# Patient Record
Sex: Female | Born: 1940 | Race: White | Hispanic: No | Marital: Married | Smoking: Never smoker
Health system: Southern US, Community
[De-identification: ages and names within clinical notes are randomized; demographics above are authoritative.]

## PROBLEM LIST (undated history)

## (undated) DIAGNOSIS — H409 Unspecified glaucoma: Secondary | ICD-10-CM

## (undated) DIAGNOSIS — L570 Actinic keratosis: Secondary | ICD-10-CM

## (undated) DIAGNOSIS — I079 Rheumatic tricuspid valve disease, unspecified: Secondary | ICD-10-CM

## (undated) DIAGNOSIS — Z8489 Family history of other specified conditions: Secondary | ICD-10-CM

## (undated) DIAGNOSIS — I10 Essential (primary) hypertension: Secondary | ICD-10-CM

## (undated) DIAGNOSIS — M5430 Sciatica, unspecified side: Secondary | ICD-10-CM

## (undated) DIAGNOSIS — C4490 Unspecified malignant neoplasm of skin, unspecified: Secondary | ICD-10-CM

## (undated) DIAGNOSIS — J302 Other seasonal allergic rhinitis: Secondary | ICD-10-CM

## (undated) DIAGNOSIS — R011 Cardiac murmur, unspecified: Secondary | ICD-10-CM

## (undated) DIAGNOSIS — I4891 Unspecified atrial fibrillation: Secondary | ICD-10-CM

## (undated) DIAGNOSIS — K8020 Calculus of gallbladder without cholecystitis without obstruction: Secondary | ICD-10-CM

## (undated) DIAGNOSIS — R200 Anesthesia of skin: Secondary | ICD-10-CM

## (undated) DIAGNOSIS — K219 Gastro-esophageal reflux disease without esophagitis: Secondary | ICD-10-CM

## (undated) DIAGNOSIS — M1990 Unspecified osteoarthritis, unspecified site: Secondary | ICD-10-CM

## (undated) DIAGNOSIS — C4492 Squamous cell carcinoma of skin, unspecified: Secondary | ICD-10-CM

## (undated) DIAGNOSIS — R42 Dizziness and giddiness: Secondary | ICD-10-CM

## (undated) HISTORY — DX: Squamous cell carcinoma of skin, unspecified: C44.92

## (undated) HISTORY — DX: Actinic keratosis: L57.0

---

## 1969-07-13 HISTORY — PX: TONSILLECTOMY: SUR1361

## 1985-07-13 HISTORY — PX: ABDOMINAL HYSTERECTOMY: SHX81

## 2004-07-15 ENCOUNTER — Ambulatory Visit: Payer: Self-pay | Admitting: Internal Medicine

## 2006-04-15 ENCOUNTER — Ambulatory Visit: Payer: Self-pay | Admitting: Internal Medicine

## 2007-03-21 ENCOUNTER — Emergency Department: Payer: Self-pay | Admitting: Emergency Medicine

## 2007-03-21 ENCOUNTER — Other Ambulatory Visit: Payer: Self-pay

## 2007-04-18 ENCOUNTER — Ambulatory Visit: Payer: Self-pay | Admitting: Internal Medicine

## 2008-07-13 HISTORY — PX: CARDIAC CATHETERIZATION: SHX172

## 2008-08-20 ENCOUNTER — Ambulatory Visit: Payer: Self-pay | Admitting: Unknown Physician Specialty

## 2008-10-01 ENCOUNTER — Ambulatory Visit: Payer: Self-pay | Admitting: Internal Medicine

## 2009-05-14 ENCOUNTER — Inpatient Hospital Stay: Payer: Self-pay | Admitting: Internal Medicine

## 2009-11-19 ENCOUNTER — Ambulatory Visit: Payer: Self-pay | Admitting: Internal Medicine

## 2011-01-28 ENCOUNTER — Ambulatory Visit: Payer: Self-pay | Admitting: Ophthalmology

## 2011-03-31 ENCOUNTER — Ambulatory Visit: Payer: Self-pay | Admitting: Internal Medicine

## 2011-07-14 HISTORY — PX: MOHS SURGERY: SUR867

## 2012-04-20 ENCOUNTER — Ambulatory Visit: Payer: Self-pay | Admitting: Internal Medicine

## 2012-07-04 ENCOUNTER — Ambulatory Visit: Payer: Self-pay | Admitting: Orthopedic Surgery

## 2013-05-09 ENCOUNTER — Ambulatory Visit: Payer: Self-pay | Admitting: Family Medicine

## 2013-11-23 ENCOUNTER — Ambulatory Visit: Payer: Self-pay | Admitting: Unknown Physician Specialty

## 2013-11-27 LAB — PATHOLOGY REPORT

## 2014-05-02 ENCOUNTER — Ambulatory Visit: Payer: Self-pay | Admitting: Family Medicine

## 2014-06-16 ENCOUNTER — Ambulatory Visit: Payer: Self-pay | Admitting: Internal Medicine

## 2014-08-21 ENCOUNTER — Ambulatory Visit: Payer: Self-pay | Admitting: Family Medicine

## 2015-06-14 ENCOUNTER — Other Ambulatory Visit: Payer: Self-pay | Admitting: Family Medicine

## 2015-06-14 DIAGNOSIS — Z8719 Personal history of other diseases of the digestive system: Secondary | ICD-10-CM

## 2015-06-14 DIAGNOSIS — R1011 Right upper quadrant pain: Secondary | ICD-10-CM

## 2015-06-17 ENCOUNTER — Ambulatory Visit
Admission: RE | Admit: 2015-06-17 | Discharge: 2015-06-17 | Disposition: A | Discharge status: Home / Self Care | Payer: Medicare Other | Source: Ambulatory Visit | Attending: Family Medicine | Admitting: Family Medicine

## 2015-06-17 DIAGNOSIS — R1011 Right upper quadrant pain: Secondary | ICD-10-CM | POA: Diagnosis not present

## 2015-06-17 DIAGNOSIS — K8020 Calculus of gallbladder without cholecystitis without obstruction: Secondary | ICD-10-CM | POA: Insufficient documentation

## 2015-06-17 DIAGNOSIS — Z8719 Personal history of other diseases of the digestive system: Secondary | ICD-10-CM

## 2015-07-30 ENCOUNTER — Other Ambulatory Visit: Payer: Self-pay | Admitting: Family Medicine

## 2015-07-30 DIAGNOSIS — K8020 Calculus of gallbladder without cholecystitis without obstruction: Secondary | ICD-10-CM

## 2015-07-30 DIAGNOSIS — R1011 Right upper quadrant pain: Secondary | ICD-10-CM

## 2015-08-05 ENCOUNTER — Encounter
Admission: RE | Admit: 2015-08-05 | Discharge: 2015-08-05 | Disposition: A | Discharge status: Home / Self Care | Payer: Medicare Other | Source: Ambulatory Visit | Attending: Family Medicine | Admitting: Family Medicine

## 2015-08-05 DIAGNOSIS — K8020 Calculus of gallbladder without cholecystitis without obstruction: Secondary | ICD-10-CM | POA: Insufficient documentation

## 2015-08-05 DIAGNOSIS — R1011 Right upper quadrant pain: Secondary | ICD-10-CM | POA: Diagnosis not present

## 2015-08-05 HISTORY — DX: Calculus of gallbladder without cholecystitis without obstruction: K80.20

## 2015-08-05 MED ORDER — SINCALIDE 5 MCG IJ SOLR: 0.0200 ug/kg | Freq: Once | INTRAMUSCULAR | Status: DC

## 2015-08-05 MED ORDER — SINCALIDE 5 MCG IJ SOLR
0.0200 ug/kg | Freq: Once | INTRAMUSCULAR | Status: AC
  Administered 2015-08-05: 1.7 ug via INTRAVENOUS

## 2015-08-05 MED ORDER — TECHNETIUM TC 99M MEBROFENIN IV KIT
5.1500 | PACK | Freq: Once | INTRAVENOUS | Status: AC | PRN
  Administered 2015-08-05: 5.15 via INTRAVENOUS

## 2015-08-22 ENCOUNTER — Encounter: Payer: Self-pay | Admitting: *Deleted

## 2015-08-26 NOTE — Discharge Instructions (Signed)

## 2015-08-28 ENCOUNTER — Ambulatory Visit: Payer: Medicare Other | Admitting: Anesthesiology

## 2015-08-28 ENCOUNTER — Encounter
Admission: RE | Disposition: A | Discharge status: Home / Self Care | Payer: Self-pay | Source: Ambulatory Visit | Attending: Ophthalmology

## 2015-08-28 ENCOUNTER — Ambulatory Visit
Admission: RE | Admit: 2015-08-28 | Discharge: 2015-08-28 | Disposition: A | Discharge status: Home / Self Care | Payer: Medicare Other | Source: Ambulatory Visit | Attending: Ophthalmology | Admitting: Ophthalmology

## 2015-08-28 DIAGNOSIS — M810 Age-related osteoporosis without current pathological fracture: Secondary | ICD-10-CM | POA: Diagnosis not present

## 2015-08-28 DIAGNOSIS — Z9889 Other specified postprocedural states: Secondary | ICD-10-CM | POA: Diagnosis not present

## 2015-08-28 DIAGNOSIS — Z85828 Personal history of other malignant neoplasm of skin: Secondary | ICD-10-CM | POA: Diagnosis not present

## 2015-08-28 DIAGNOSIS — I4891 Unspecified atrial fibrillation: Secondary | ICD-10-CM | POA: Diagnosis not present

## 2015-08-28 DIAGNOSIS — H269 Unspecified cataract: Secondary | ICD-10-CM | POA: Diagnosis present

## 2015-08-28 DIAGNOSIS — Z9849 Cataract extraction status, unspecified eye: Secondary | ICD-10-CM | POA: Insufficient documentation

## 2015-08-28 DIAGNOSIS — Z79899 Other long term (current) drug therapy: Secondary | ICD-10-CM | POA: Insufficient documentation

## 2015-08-28 DIAGNOSIS — Z888 Allergy status to other drugs, medicaments and biological substances status: Secondary | ICD-10-CM | POA: Diagnosis not present

## 2015-08-28 DIAGNOSIS — K219 Gastro-esophageal reflux disease without esophagitis: Secondary | ICD-10-CM | POA: Insufficient documentation

## 2015-08-28 DIAGNOSIS — I10 Essential (primary) hypertension: Secondary | ICD-10-CM | POA: Insufficient documentation

## 2015-08-28 DIAGNOSIS — H2511 Age-related nuclear cataract, right eye: Secondary | ICD-10-CM | POA: Diagnosis not present

## 2015-08-28 DIAGNOSIS — Z90710 Acquired absence of both cervix and uterus: Secondary | ICD-10-CM | POA: Diagnosis not present

## 2015-08-28 DIAGNOSIS — M1991 Primary osteoarthritis, unspecified site: Secondary | ICD-10-CM | POA: Diagnosis not present

## 2015-08-28 HISTORY — DX: Sciatica, unspecified side: M54.30

## 2015-08-28 HISTORY — DX: Gastro-esophageal reflux disease without esophagitis: K21.9

## 2015-08-28 HISTORY — DX: Unspecified malignant neoplasm of skin, unspecified: C44.90

## 2015-08-28 HISTORY — DX: Essential (primary) hypertension: I10

## 2015-08-28 HISTORY — DX: Unspecified osteoarthritis, unspecified site: M19.90

## 2015-08-28 HISTORY — DX: Dizziness and giddiness: R42

## 2015-08-28 HISTORY — DX: Unspecified glaucoma: H40.9

## 2015-08-28 HISTORY — DX: Unspecified atrial fibrillation: I48.91

## 2015-08-28 HISTORY — DX: Anesthesia of skin: R20.0

## 2015-08-28 HISTORY — DX: Other seasonal allergic rhinitis: J30.2

## 2015-08-28 HISTORY — DX: Family history of other specified conditions: Z84.89

## 2015-08-28 HISTORY — PX: CATARACT EXTRACTION W/PHACO: SHX586

## 2015-08-28 HISTORY — DX: Rheumatic tricuspid valve disease, unspecified: I07.9

## 2015-08-28 HISTORY — DX: Cardiac murmur, unspecified: R01.1

## 2015-08-28 SURGERY — PHACOEMULSIFICATION, CATARACT, WITH IOL INSERTION
Anesthesia: Monitor Anesthesia Care | Laterality: Right | Wound class: Clean

## 2015-08-28 MED ORDER — NA HYALUR & NA CHOND-NA HYALUR 0.4-0.35 ML IO KIT
PACK | INTRAOCULAR | Status: DC | PRN
  Administered 2015-08-28: 1 mL via INTRAOCULAR

## 2015-08-28 MED ORDER — MIDAZOLAM HCL 2 MG/2ML IJ SOLN
INTRAMUSCULAR | Status: DC | PRN
  Administered 2015-08-28: 2 mg via INTRAVENOUS

## 2015-08-28 MED ORDER — TETRACAINE HCL 0.5 % OP SOLN
1.0000 [drp] | OPHTHALMIC | Status: DC | PRN
  Administered 2015-08-28: 1 [drp] via OPHTHALMIC

## 2015-08-28 MED ORDER — ARMC OPHTHALMIC DILATING GEL
1.0000 "application " | OPHTHALMIC | Status: DC | PRN
  Administered 2015-08-28 (×2): 1 via OPHTHALMIC

## 2015-08-28 MED ORDER — POVIDONE-IODINE 5 % OP SOLN
1.0000 "application " | OPHTHALMIC | Status: DC | PRN
  Administered 2015-08-28: 1 via OPHTHALMIC

## 2015-08-28 MED ORDER — CEFUROXIME OPHTHALMIC INJECTION 1 MG/0.1 ML
INJECTION | OPHTHALMIC | Status: DC | PRN
  Administered 2015-08-28: 0.1 mL via OPHTHALMIC

## 2015-08-28 MED ORDER — EPINEPHRINE HCL 1 MG/ML IJ SOLN
INTRAOCULAR | Status: DC | PRN
  Administered 2015-08-28: 61 mL via OPHTHALMIC

## 2015-08-28 MED ORDER — TIMOLOL MALEATE 0.5 % OP SOLN
OPHTHALMIC | Status: DC | PRN
  Administered 2015-08-28: 1 [drp] via OPHTHALMIC

## 2015-08-28 MED ORDER — FENTANYL CITRATE (PF) 100 MCG/2ML IJ SOLN
INTRAMUSCULAR | Status: DC | PRN
  Administered 2015-08-28: 100 ug via INTRAVENOUS

## 2015-08-28 SURGICAL SUPPLY — 28 items
CANNULA ANT/CHMB 27GA (MISCELLANEOUS) ×2 IMPLANT
CARTRIDGE ABBOTT (MISCELLANEOUS) ×2 IMPLANT
GLOVE SURG LX 7.5 STRW (GLOVE) ×1
GLOVE SURG LX STRL 7.5 STRW (GLOVE) ×1 IMPLANT
GLOVE SURG TRIUMPH 8.0 PF LTX (GLOVE) ×2 IMPLANT
GOWN STRL REUS W/ TWL LRG LVL3 (GOWN DISPOSABLE) ×2 IMPLANT
GOWN STRL REUS W/TWL LRG LVL3 (GOWN DISPOSABLE) ×2
LENS IOL ACRSF IQ TRC 5 21.5 (Intraocular Lens) ×1 IMPLANT
LENS IOL ACRYSOF IQ TORIC 21.5 (Intraocular Lens) ×1 IMPLANT
LENS IOL IQ TORIC 5 21.5 (Intraocular Lens) ×1 IMPLANT
MARKER SKIN DUAL TIP RULER LAB (MISCELLANEOUS) ×2 IMPLANT
NDL RETROBULBAR .5 NSTRL (NEEDLE) IMPLANT
NEEDLE FILTER BLUNT 18X 1/2SAF (NEEDLE) ×1
NEEDLE FILTER BLUNT 18X1 1/2 (NEEDLE) ×1 IMPLANT
PACK CATARACT BRASINGTON (MISCELLANEOUS) ×2 IMPLANT
PACK EYE AFTER SURG (MISCELLANEOUS) ×2 IMPLANT
PACK OPTHALMIC (MISCELLANEOUS) ×2 IMPLANT
RING MALYGIN 7.0 (MISCELLANEOUS) IMPLANT
SUT ETHILON 10-0 CS-B-6CS-B-6 (SUTURE)
SUT VICRYL  9 0 (SUTURE)
SUT VICRYL 9 0 (SUTURE) IMPLANT
SUTURE EHLN 10-0 CS-B-6CS-B-6 (SUTURE) IMPLANT
SYR 3ML LL SCALE MARK (SYRINGE) ×2 IMPLANT
SYR 5ML LL (SYRINGE) IMPLANT
SYR TB 1ML LUER SLIP (SYRINGE) ×2 IMPLANT
WATER STERILE IRR 250ML POUR (IV SOLUTION) ×2 IMPLANT
WATER STERILE IRR 500ML POUR (IV SOLUTION) IMPLANT
WIPE NON LINTING 3.25X3.25 (MISCELLANEOUS) ×2 IMPLANT

## 2015-08-28 NOTE — H&P (Signed)
  The History and Physical notes are on paper, have been signed, and are to be scanned. The patient remains stable and unchanged from the H&P.   Previous H&P reviewed, patient examined, and there are no changes.  Joyce Evans 08/28/2015 9:20 AM

## 2015-08-28 NOTE — Anesthesia Preprocedure Evaluation (Signed)
Anesthesia Evaluation  Patient identified by MRN, date of birth, ID band  Reviewed: Allergy & Precautions, H&P , NPO status , Patient's Chart, lab work & pertinent test results  Airway Mallampati: II  TM Distance: >3 FB Neck ROM: full    Dental no notable dental hx.    Pulmonary    Pulmonary exam normal       Cardiovascular hypertension, + dysrhythmias Atrial Fibrillation Rhythm:regular Rate:Normal     Neuro/Psych    GI/Hepatic GERD-  ,  Endo/Other    Renal/GU      Musculoskeletal   Abdominal   Peds  Hematology   Anesthesia Other Findings   Reproductive/Obstetrics                             Anesthesia Physical Anesthesia Plan  ASA: II  Anesthesia Plan: MAC   Post-op Pain Management:    Induction:   Airway Management Planned:   Additional Equipment:   Intra-op Plan:   Post-operative Plan:   Informed Consent: I have reviewed the patients History and Physical, chart, labs and discussed the procedure including the risks, benefits and alternatives for the proposed anesthesia with the patient or authorized representative who has indicated his/her understanding and acceptance.     Plan Discussed with: CRNA  Anesthesia Plan Comments:         Anesthesia Quick Evaluation  

## 2015-08-28 NOTE — Anesthesia Postprocedure Evaluation (Signed)
Anesthesia Post Note  Patient: Joyce Evans  Procedure(s) Performed: Procedure(s) (LRB): CATARACT EXTRACTION PHACO AND INTRAOCULAR LENS PLACEMENT (IOC) (Right)  Patient location during evaluation: PACU Anesthesia Type: MAC Level of consciousness: awake and alert and oriented Pain management: satisfactory to patient Vital Signs Assessment: post-procedure vital signs reviewed and stable Respiratory status: spontaneous breathing, nonlabored ventilation and respiratory function stable Cardiovascular status: blood pressure returned to baseline and stable Postop Assessment: Adequate PO intake and No signs of nausea or vomiting Anesthetic complications: no    Raliegh Ip

## 2015-08-28 NOTE — Anesthesia Procedure Notes (Signed)
Procedure Name: MAC Performed by: Tomislav Micale Pre-anesthesia Checklist: Patient identified, Emergency Drugs available, Suction available, Timeout performed and Patient being monitored Patient Re-evaluated:Patient Re-evaluated prior to inductionOxygen Delivery Method: Nasal cannula Placement Confirmation: positive ETCO2       

## 2015-08-28 NOTE — Transfer of Care (Signed)
Immediate Anesthesia Transfer of Care Note  Patient: Joyce Evans  Procedure(s) Performed: Procedure(s) with comments: CATARACT EXTRACTION PHACO AND INTRAOCULAR LENS PLACEMENT (IOC) (Right) - TORIC  Patient Location: PACU  Anesthesia Type: MAC  Level of Consciousness: awake, alert  and patient cooperative  Airway and Oxygen Therapy: Patient Spontanous Breathing and Patient connected to supplemental oxygen  Post-op Assessment: Post-op Vital signs reviewed, Patient's Cardiovascular Status Stable, Respiratory Function Stable, Patent Airway and No signs of Nausea or vomiting  Post-op Vital Signs: Reviewed and stable  Complications: No apparent anesthesia complications

## 2015-08-28 NOTE — Op Note (Signed)
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   PROCEDURE:  Phacoemulsification with Toric posterior chamber intraocular lens placement of the right eye.   LENS:   Implant Name Type Inv. Item Serial No. Manufacturer Lot No. LRB No. Used  lens 21.5     [id number] ALCON   Right 1     [id number] 21.5 D Toric intraocular lens with 3.0 diopters of cylindrical power with axis orientation at 92 degrees.   ULTRASOUND TIME: 16.5 % of 1 minutes, 0 seconds.  CDE 10.0   SURGEON:  Wyonia Hough, MD   ANESTHESIA:  Topical with tetracaine drops and 2% Xylocaine jelly.   COMPLICATIONS:  None.   DESCRIPTION OF PROCEDURE:  The patient was identified in the holding room and transported to the operating suite and placed in the supine position under the operating microscope.  The right eye was identified as the operative eye, and it was prepped and draped in the usual sterile ophthalmic fashion.    A clear-corneal paracentesis incision was made at the 12:00 position.  The anterior chamber was filled with Viscoat.  A 2.4 millimeter near clear corneal incision was then made at the 9:00 position.  A cystotome and capsulorrhexis forceps were then used to make a curvilinear capsulorrhexis.  Hydrodissection and hydrodelineation were then performed using balanced salt solution.   Phacoemulsification was then used in stop and chop fashion to remove the lens, nucleus and epinucleus.  The remaining cortex was aspirated using the irrigation and aspiration handpiece.  Provisc viscoelastic was then placed into the capsular bag to distend it for lens placement.  The Verion digital marker was used to align the implant at the intended axis.   A Toric lens was then injected into the capsular bag.  It was rotated clockwise until the axis marks on the lens were approximately 15 degrees  in the counterclockwise direction to the intended alignment.  The viscoelastic was aspirated from the eye using the irrigation aspiration handpiece.  Then, a Koch spatula through the sideport incision was used to rotate the lens in a clockwise direction until the axis markings of the intraocular lens were lined up with the Verion alignment.  Balanced salt solution was then used to hydrate the wounds. Cefuroxime 0.1 ml of a 10mg /ml solution was injected into the anterior chamber for a dose of 1 mg of intracameral antibiotic at the completion of the case.    The eye was noted to have a physiologic pressure and there was no wound leak noted.   Timolol and Brimonidine drops were applied to the eye.  The patient was taken to the recovery room in stable condition having had no complications of anesthesia or surgery.  Joyce Evans 08/28/2015, 10:11 AM

## 2015-08-29 ENCOUNTER — Encounter: Payer: Self-pay | Admitting: Ophthalmology

## 2015-10-16 ENCOUNTER — Ambulatory Visit: Payer: Medicare Other | Attending: Specialist

## 2015-10-16 DIAGNOSIS — G4761 Periodic limb movement disorder: Secondary | ICD-10-CM | POA: Diagnosis not present

## 2015-10-16 DIAGNOSIS — Z6833 Body mass index (BMI) 33.0-33.9, adult: Secondary | ICD-10-CM | POA: Insufficient documentation

## 2016-04-20 DIAGNOSIS — C439 Malignant melanoma of skin, unspecified: Secondary | ICD-10-CM

## 2016-04-20 HISTORY — DX: Malignant melanoma of skin, unspecified: C43.9

## 2016-05-19 HISTORY — PX: MELANOMA EXCISION: SHX5266

## 2016-11-03 ENCOUNTER — Other Ambulatory Visit: Payer: Self-pay | Admitting: Family Medicine

## 2016-11-03 DIAGNOSIS — Z1231 Encounter for screening mammogram for malignant neoplasm of breast: Secondary | ICD-10-CM

## 2016-11-18 ENCOUNTER — Ambulatory Visit
Admission: RE | Admit: 2016-11-18 | Discharge: 2016-11-18 | Disposition: A | Discharge status: Home / Self Care | Payer: Medicare Other | Source: Ambulatory Visit | Attending: Family Medicine | Admitting: Family Medicine

## 2016-11-18 DIAGNOSIS — Z1231 Encounter for screening mammogram for malignant neoplasm of breast: Secondary | ICD-10-CM | POA: Diagnosis not present

## 2017-11-24 ENCOUNTER — Other Ambulatory Visit: Payer: Self-pay | Admitting: Family Medicine

## 2017-11-24 DIAGNOSIS — Z1231 Encounter for screening mammogram for malignant neoplasm of breast: Secondary | ICD-10-CM

## 2017-12-30 ENCOUNTER — Ambulatory Visit
Admission: RE | Admit: 2017-12-30 | Discharge: 2017-12-30 | Disposition: A | Discharge status: Home / Self Care | Payer: Medicare Other | Source: Ambulatory Visit | Attending: Family Medicine | Admitting: Family Medicine

## 2017-12-30 DIAGNOSIS — Z1231 Encounter for screening mammogram for malignant neoplasm of breast: Secondary | ICD-10-CM | POA: Insufficient documentation

## 2018-03-08 ENCOUNTER — Encounter: Payer: Self-pay | Admitting: *Deleted

## 2018-03-08 ENCOUNTER — Encounter: Payer: Medicare Other | Attending: Internal Medicine | Admitting: *Deleted

## 2018-03-08 VITALS — Ht 63.2 in | Wt 179.5 lb

## 2018-03-08 DIAGNOSIS — Z952 Presence of prosthetic heart valve: Secondary | ICD-10-CM | POA: Diagnosis not present

## 2018-03-08 DIAGNOSIS — Z954 Presence of other heart-valve replacement: Secondary | ICD-10-CM

## 2018-03-08 NOTE — Patient Instructions (Signed)
Patient Instructions  Patient Details  Name: Joyce Evans MRN: [id number] Date of Birth: June 07, 1941 Referring Provider:  Joyice Faster, MD  Below are your personal goals for exercise, nutrition, and risk factors. Our goal is to help you stay on track towards obtaining and maintaining these goals. We will be discussing your progress on these goals with you throughout the program.  Initial Exercise Prescription: Initial Exercise Prescription - 03/08/18 1400      Date of Initial Exercise RX and Referring Provider   Date  03/08/18    Referring Provider  Ward, Jeani Hawking MD      Treadmill   MPH  2    Grade  0    Minutes  15    METs  2.53      NuStep   Level  2    SPM  80    Minutes  15    METs  2.5      Recumbant Elliptical   Level  1    RPM  50    Minutes  15    METs  2.5      Prescription Details   Frequency (times per week)  3    Duration  Progress to 45 minutes of aerobic exercise without signs/symptoms of physical distress      Intensity   THRR 40-80% of Max Heartrate  96-129    Ratings of Perceived Exertion  11-13    Perceived Dyspnea  0-4      Progression   Progression  Continue to progress workloads to maintain intensity without signs/symptoms of physical distress.      Resistance Training   Training Prescription  Yes    Weight  3 lbs    Reps  10-15       Exercise Goals: Frequency: Be able to perform aerobic exercise two to three times per week in program working toward 2-5 days per week of home exercise.  Intensity: Work with a perceived exertion of 11 (fairly light) - 15 (hard) while following your exercise prescription.  We will make changes to your prescription with you as you progress through the program.   Duration: Be able to do 30 to 45 minutes of continuous aerobic exercise in addition to a 5 minute warm-up and a 5 minute cool-down routine.   Nutrition Goals: Your personal nutrition goals will be established when you do your nutrition analysis with  the dietician.  The following are general nutrition guidelines to follow: Cholesterol < 200mg /day Sodium < 1500mg /day Fiber: Women over 50 yrs - 21 grams per day  Personal Goals: Personal Goals and Risk Factors at Admission - 03/08/18 1515      Core Components/Risk Factors/Patient Goals on Admission    Weight Management  Yes;Weight Loss    Admit Weight  179 lb (81.2 kg)    Goal Weight: Short Term  177 lb (80.3 kg)    Goal Weight: Long Term  160 lb (72.6 kg)    Expected Outcomes  Short Term: Continue to assess and modify interventions until short term weight is achieved;Long Term: Adherence to nutrition and physical activity/exercise program aimed toward attainment of established weight goal;Weight Loss: Understanding of general recommendations for a balanced deficit meal plan, which promotes 1-2 lb weight loss per week and includes a negative energy balance of [phone] kcal/d    Hypertension  Yes    Intervention  Provide education on lifestyle modifcations including regular physical activity/exercise, weight management, moderate sodium restriction and increased consumption of fresh fruit,  vegetables, and low fat dairy, alcohol moderation, and smoking cessation.;Monitor prescription use compliance.    Expected Outcomes  Short Term: Continued assessment and intervention until BP is < 140/10mm HG in hypertensive participants. < 130/16mm HG in hypertensive participants with diabetes, heart failure or chronic kidney disease.;Long Term: Maintenance of blood pressure at goal levels.       Tobacco Use Initial Evaluation: Social History   Tobacco Use  Smoking Status Never Smoker  Smokeless Tobacco Never Used    Exercise Goals and Review: Exercise Goals    Row Name 03/08/18 1457             Exercise Goals   Increase Physical Activity  Yes       Intervention  Provide advice, education, support and counseling about physical activity/exercise needs.;Develop an individualized exercise  prescription for aerobic and resistive training based on initial evaluation findings, risk stratification, comorbidities and participant's personal goals.       Expected Outcomes  Short Term: Attend rehab on a regular basis to increase amount of physical activity.;Long Term: Add in home exercise to make exercise part of routine and to increase amount of physical activity.;Long Term: Exercising regularly at least 3-5 days a week.       Increase Strength and Stamina  Yes       Intervention  Provide advice, education, support and counseling about physical activity/exercise needs.;Develop an individualized exercise prescription for aerobic and resistive training based on initial evaluation findings, risk stratification, comorbidities and participant's personal goals.       Expected Outcomes  Short Term: Increase workloads from initial exercise prescription for resistance, speed, and METs.;Short Term: Perform resistance training exercises routinely during rehab and add in resistance training at home;Long Term: Improve cardiorespiratory fitness, muscular endurance and strength as measured by increased METs and functional capacity (6MWT)       Able to understand and use rate of perceived exertion (RPE) scale  Yes       Intervention  Provide education and explanation on how to use RPE scale       Expected Outcomes  Short Term: Able to use RPE daily in rehab to express subjective intensity level;Long Term:  Able to use RPE to guide intensity level when exercising independently       Able to understand and use Dyspnea scale  Yes       Intervention  Provide education and explanation on how to use Dyspnea scale       Expected Outcomes  Short Term: Able to use Dyspnea scale daily in rehab to express subjective sense of shortness of breath during exertion;Long Term: Able to use Dyspnea scale to guide intensity level when exercising independently       Knowledge and understanding of Target Heart Rate Range (THRR)  Yes        Intervention  Provide education and explanation of THRR including how the numbers were predicted and where they are located for reference       Expected Outcomes  Short Term: Able to use daily as guideline for intensity in rehab;Short Term: Able to state/look up THRR;Long Term: Able to use THRR to govern intensity when exercising independently       Able to check pulse independently  Yes       Intervention  Provide education and demonstration on how to check pulse in carotid and radial arteries.;Review the importance of being able to check your own pulse for safety during independent exercise  Expected Outcomes  Short Term: Able to explain why pulse checking is important during independent exercise;Long Term: Able to check pulse independently and accurately       Understanding of Exercise Prescription  Yes       Intervention  Provide education, explanation, and written materials on patient's individual exercise prescription       Expected Outcomes  Short Term: Able to explain program exercise prescription;Long Term: Able to explain home exercise prescription to exercise independently          Copy of goals given to participant.

## 2018-03-08 NOTE — Progress Notes (Signed)
Daily Session Note  Patient Details  Name: Joyce Evans MRN: [id number] Date of Birth: 1941-03-29 Referring Provider:     Cardiac Rehab from 03/08/2018 in [facility]  Referring Provider  Ward, Jeani Hawking MD      Encounter Date: 03/08/2018  Check In: Session Check In - 03/08/18 1506      Check-In   Supervising physician immediately available to respond to emergencies  See telemetry face sheet for immediately available ER MD    Location  [facility]    Staff Present  Heath Lark, RN, BSN, CCRP;Jessica [place], MA, RCEP, CCRP, Exercise Physiologist    Warm-up and Cool-down  --   medical review completed today   VAD Patient?  No    PAD/SET Patient?  No      Pain Assessment   Currently in Pain?  No/denies        Exercise Prescription Changes - 03/08/18 1400      Response to Exercise   Blood Pressure (Admit)  128/74    Blood Pressure (Exercise)  154/84    Blood Pressure (Exit)  146/64    Heart Rate (Admit)  64 bpm    Heart Rate (Exercise)  112 bpm    Heart Rate (Exit)  81 bpm    Oxygen Saturation (Admit)  99 %    Oxygen Saturation (Exercise)  94 %    Rating of Perceived Exertion (Exercise)  13    Perceived Dyspnea (Exercise)  1    Symptoms  SOB, low back twinge    Comments  walk test results       Social History   Tobacco Use  Smoking Status Never Smoker    Goals Met:  Exercise tolerated well Personal goals reviewed No report of cardiac concerns or symptoms  Goals Unmet:  Not Applicable  Comments: Medical review completed today   Dr. Emily Filbert is Medical Director for [place] and [name].

## 2018-03-08 NOTE — Progress Notes (Signed)
Cardiac Individual Treatment Plan  Patient Details  Name: Joyce Evans MRN: [id number] Date of Birth: 18-Jul-1940 Referring Provider:     Cardiac Rehab from 03/08/2018 in [facility] Cardiac and Pulmonary Rehab  Referring Provider  Ward, Jeani Hawking MD      Initial Encounter Date:    Cardiac Rehab from 03/08/2018 in [facility] Cardiac and Pulmonary Rehab  Date  03/08/18      Visit Diagnosis: S/P tricuspid valve replacement  Patient's Home Medications on Admission:  Current Outpatient Medications:  .  acetaminophen (TYLENOL) 500 MG tablet, Take 500 mg by mouth every 6 (six) hours as needed., Disp: , Rfl:  .  apixaban (ELIQUIS) 5 MG TABS tablet, TAKE 1 TABLET BY MOUTH TWO  TIMES DAILY, Disp: , Rfl:  .  brimonidine (ALPHAGAN P) 0.1 % SOLN, Place 1 drop into the right eye 2 (two) times daily., Disp: , Rfl:  .  Brinzolamide-Brimonidine (SIMBRINZA) 1-0.2 % SUSP, Place 1 drop into the left eye 2 (two) times daily., Disp: , Rfl:  .  Cholecalciferol (VITAMIN D3) 3000 units TABS, Take by mouth 2 (two) times daily., Disp: , Rfl:  .  furosemide (LASIX) 20 MG tablet, Take by mouth., Disp: , Rfl:  .  multivitamin-iron-minerals-folic acid (CENTRUM) chewable tablet, Chew 1 tablet by mouth daily., Disp: , Rfl:  .  omeprazole (PRILOSEC) 20 MG capsule, Take 20 mg by mouth 2 (two) times daily. Reported on 08/28/2015, Disp: , Rfl:  .  potassium chloride (K-DUR) 10 MEQ tablet, Take by mouth., Disp: , Rfl:  .  Probiotic Product (PROBIOTIC DAILY PO), Take by mouth daily., Disp: , Rfl:  .  timolol (BETIMOL) 0.5 % ophthalmic solution, Place 1 drop into both eyes daily., Disp: , Rfl:  .  ALPRAZolam (XANAX) 0.25 MG tablet, Take 0.125 mg by mouth at bedtime., Disp: , Rfl:  .  Calcium-Phosphorus-Vitamin D (CITRACAL +D3 PO), Take by mouth daily., Disp: , Rfl:  .  famotidine-calcium carbonate-magnesium hydroxide (PEPCID COMPLETE) 10-800-165 MG chewable tablet, Chew 1 tablet by mouth daily as needed., Disp: , Rfl:   Past Medical  History: Past Medical History:  Diagnosis Date  . Arthritis    osteo - shoulders, fingers  . Atrial fibrillation ([place])   . Dizziness    thinks because of diuretic  . Family history of adverse reaction to anesthesia    daughter -PONV  . Gallstones 2016, 2017  . GERD (gastroesophageal reflux disease)   . Glaucoma   . Heart murmur    history of  . Hypertension   . Numbness in left leg    s/p hematoma  . Sciatica    right  . Seasonal allergies   . Skin cancer   . Tricuspid valve disorder    leak    Tobacco Use: Social History   Tobacco Use  Smoking Status Never Smoker  Smokeless Tobacco Never Used    Labs: Recent Review Flowsheet Data    There is no flowsheet data to display.       Exercise Target Goals: Exercise Program Goal: Individual exercise prescription set using results from initial 6 min walk test and THRR while considering  patient's activity barriers and safety.   Exercise Prescription Goal: Initial exercise prescription builds to 30-45 minutes a day of aerobic activity, 2-3 days per week.  Home exercise guidelines will be given to patient during program as part of exercise prescription that the participant will acknowledge.  Activity Barriers & Risk Stratification: Activity Barriers & Cardiac Risk Stratification - 03/08/18  1452      Activity Barriers & Cardiac Risk Stratification   Activity Barriers  Deconditioning;Muscular Weakness;Shortness of Breath;History of Falls;Balance Concerns;Joint Problems   R arm sore since surgery, L shoulder very little cartilidge, limited OM   Cardiac Risk Stratification  Moderate       6 Minute Walk: 6 Minute Walk    Row Name 03/08/18 1451         6 Minute Walk   Phase  Initial     Distance  1265 feet     Walk Time  6 minutes     # of Rest Breaks  0     MPH  2.4     METS  2.54     RPE  13     Perceived Dyspnea   1     VO2 Peak  8.89     Symptoms  Yes (comment)     Comments  SOB, twinge in low back      Resting HR  64 bpm     Resting BP  128/74     Resting Oxygen Saturation   99 %     Exercise Oxygen Saturation  during 6 min walk  94 %     Max Ex. HR  112 bpm     Max Ex. BP  154/84     2 Minute Post BP  146/64        Oxygen Initial Assessment:   Oxygen Re-Evaluation:   Oxygen Discharge (Final Oxygen Re-Evaluation):   Initial Exercise Prescription: Initial Exercise Prescription - 03/08/18 1400      Date of Initial Exercise RX and Referring Provider   Date  03/08/18    Referring Provider  Ward, Jeani Hawking MD      Treadmill   MPH  2    Grade  0    Minutes  15    METs  2.53      NuStep   Level  2    SPM  80    Minutes  15    METs  2.5      Recumbant Elliptical   Level  1    RPM  50    Minutes  15    METs  2.5      Prescription Details   Frequency (times per week)  3    Duration  Progress to 45 minutes of aerobic exercise without signs/symptoms of physical distress      Intensity   THRR 40-80% of Max Heartrate  96-129    Ratings of Perceived Exertion  11-13    Perceived Dyspnea  0-4      Progression   Progression  Continue to progress workloads to maintain intensity without signs/symptoms of physical distress.      Resistance Training   Training Prescription  Yes    Weight  3 lbs    Reps  10-15       Perform Capillary Blood Glucose checks as needed.  Exercise Prescription Changes: Exercise Prescription Changes    Row Name 03/08/18 1400             Response to Exercise   Blood Pressure (Admit)  128/74       Blood Pressure (Exercise)  154/84       Blood Pressure (Exit)  146/64       Heart Rate (Admit)  64 bpm       Heart Rate (Exercise)  112 bpm       Heart Rate (Exit)  81 bpm       Oxygen Saturation (Admit)  99 %       Oxygen Saturation (Exercise)  94 %       Rating of Perceived Exertion (Exercise)  13       Perceived Dyspnea (Exercise)  1       Symptoms  SOB, low back twinge       Comments  walk test results          Exercise  Comments:   Exercise Goals and Review: Exercise Goals    Row Name 03/08/18 1457             Exercise Goals   Increase Physical Activity  Yes       Intervention  Provide advice, education, support and counseling about physical activity/exercise needs.;Develop an individualized exercise prescription for aerobic and resistive training based on initial evaluation findings, risk stratification, comorbidities and participant's personal goals.       Expected Outcomes  Short Term: Attend rehab on a regular basis to increase amount of physical activity.;Long Term: Add in home exercise to make exercise part of routine and to increase amount of physical activity.;Long Term: Exercising regularly at least 3-5 days a week.       Increase Strength and Stamina  Yes       Intervention  Provide advice, education, support and counseling about physical activity/exercise needs.;Develop an individualized exercise prescription for aerobic and resistive training based on initial evaluation findings, risk stratification, comorbidities and participant's personal goals.       Expected Outcomes  Short Term: Increase workloads from initial exercise prescription for resistance, speed, and METs.;Short Term: Perform resistance training exercises routinely during rehab and add in resistance training at home;Long Term: Improve cardiorespiratory fitness, muscular endurance and strength as measured by increased METs and functional capacity (6MWT)       Able to understand and use rate of perceived exertion (RPE) scale  Yes       Intervention  Provide education and explanation on how to use RPE scale       Expected Outcomes  Short Term: Able to use RPE daily in rehab to express subjective intensity level;Long Term:  Able to use RPE to guide intensity level when exercising independently       Able to understand and use Dyspnea scale  Yes       Intervention  Provide education and explanation on how to use Dyspnea scale        Expected Outcomes  Short Term: Able to use Dyspnea scale daily in rehab to express subjective sense of shortness of breath during exertion;Long Term: Able to use Dyspnea scale to guide intensity level when exercising independently       Knowledge and understanding of Target Heart Rate Range (THRR)  Yes       Intervention  Provide education and explanation of THRR including how the numbers were predicted and where they are located for reference       Expected Outcomes  Short Term: Able to use daily as guideline for intensity in rehab;Short Term: Able to state/look up THRR;Long Term: Able to use THRR to govern intensity when exercising independently       Able to check pulse independently  Yes       Intervention  Provide education and demonstration on how to check pulse in carotid and radial arteries.;Review the importance of being able to check your own pulse for safety during independent exercise  Expected Outcomes  Short Term: Able to explain why pulse checking is important during independent exercise;Long Term: Able to check pulse independently and accurately       Understanding of Exercise Prescription  Yes       Intervention  Provide education, explanation, and written materials on patient's individual exercise prescription       Expected Outcomes  Short Term: Able to explain program exercise prescription;Long Term: Able to explain home exercise prescription to exercise independently          Exercise Goals Re-Evaluation :   Discharge Exercise Prescription (Final Exercise Prescription Changes): Exercise Prescription Changes - 03/08/18 1400      Response to Exercise   Blood Pressure (Admit)  128/74    Blood Pressure (Exercise)  154/84    Blood Pressure (Exit)  146/64    Heart Rate (Admit)  64 bpm    Heart Rate (Exercise)  112 bpm    Heart Rate (Exit)  81 bpm    Oxygen Saturation (Admit)  99 %    Oxygen Saturation (Exercise)  94 %    Rating of Perceived Exertion (Exercise)  13     Perceived Dyspnea (Exercise)  1    Symptoms  SOB, low back twinge    Comments  walk test results       Nutrition:  Target Goals: Understanding of nutrition guidelines, daily intake of sodium <1546m, cholesterol <2065m calories 30% from fat and 7% or less from saturated fats, daily to have 5 or more servings of fruits and vegetables.  Biometrics: Pre Biometrics - 03/08/18 1458      Pre Biometrics   Height  5' 3.2" (1.605 m)    Weight  179 lb 8 oz (81.4 kg)    Waist Circumference  34 inches    Hip Circumference  43 inches    Waist to Hip Ratio  0.79 %    BMI (Calculated)  31.61    Single Leg Stand  1.81 seconds        Nutrition Therapy Plan and Nutrition Goals: Nutrition Therapy & Goals - 03/08/18 1514      Intervention Plan   Intervention  Prescribe, educate and counsel regarding individualized specific dietary modifications aiming towards targeted core components such as weight, hypertension, lipid management, diabetes, heart failure and other comorbidities.    Expected Outcomes  Short Term Goal: Understand basic principles of dietary content, such as calories, fat, sodium, cholesterol and nutrients.;Short Term Goal: A plan has been developed with personal nutrition goals set during dietitian appointment.;Long Term Goal: Adherence to prescribed nutrition plan.       Nutrition Assessments: Nutrition Assessments - 03/08/18 1428      MEDFICTS Scores   Pre Score  40       Nutrition Goals Re-Evaluation:   Nutrition Goals Discharge (Final Nutrition Goals Re-Evaluation):   Psychosocial: Target Goals: Acknowledge presence or absence of significant depression and/or stress, maximize coping skills, provide positive support system. Participant is able to verbalize types and ability to use techniques and skills needed for reducing stress and depression.   Initial Review & Psychosocial Screening: Initial Psych Review & Screening - 03/08/18 1513      Initial Review   Current  issues with  Current Stress Concerns    Source of Stress Concerns  Unable to perform yard/household activities    Comments  Wants to get back to her outside yard/garden.       Family Dynamics   Good Support System?  Yes  Spouse, 2 daughters, friends, 2 sisters     Barriers   Psychosocial barriers to participate in program  There are no identifiable barriers or psychosocial needs.      Screening Interventions   Interventions  Encouraged to exercise    Expected Outcomes  Short Term goal: Utilizing psychosocial counselor, staff and physician to assist with identification of specific Stressors or current issues interfering with healing process. Setting desired goal for each stressor or current issue identified.;Long Term Goal: Stressors or current issues are controlled or eliminated.;Short Term goal: Identification and review with participant of any Quality of Life or Depression concerns found by scoring the questionnaire.;Long Term goal: The participant improves quality of Life and PHQ9 Scores as seen by post scores and/or verbalization of changes       Quality of Life Scores:  Quality of Life - 03/08/18 1428      Quality of Life   Select  Quality of Life      Quality of Life Scores   Health/Function Pre  26.8 %    Socioeconomic Pre  28.75 %    Psych/Spiritual Pre  29.14 %    Family Pre  28.8 %    GLOBAL Pre  27.95 %      Scores of 19 and below usually indicate a poorer quality of life in these areas.  A difference of  2-3 points is a clinically meaningful difference.  A difference of 2-3 points in the total score of the Quality of Life Index has been associated with significant improvement in overall quality of life, self-image, physical symptoms, and general health in studies assessing change in quality of life.  PHQ-9: Recent Review Flowsheet Data    Depression screen [facility] 2/9 03/08/2018   Decreased Interest 1   Down, Depressed, Hopeless 0   PHQ - 2 Score 1   Altered sleeping 0    Tired, decreased energy 1   Change in appetite 0   Feeling bad or failure about yourself  0   Trouble concentrating 0   Moving slowly or fidgety/restless 0   Suicidal thoughts 0   PHQ-9 Score 2   Difficult doing work/chores Not difficult at all     Interpretation of Total Score  Total Score Depression Severity:  1-4 = Minimal depression, 5-9 = Mild depression, 10-14 = Moderate depression, 15-19 = Moderately severe depression, 20-27 = Severe depression   Psychosocial Evaluation and Intervention:   Psychosocial Re-Evaluation:   Psychosocial Discharge (Final Psychosocial Re-Evaluation):   Vocational Rehabilitation: Provide vocational rehab assistance to qualifying candidates.   Vocational Rehab Evaluation & Intervention: Vocational Rehab - 03/08/18 1515      Initial Vocational Rehab Evaluation & Intervention   Assessment shows need for Vocational Rehabilitation  No       Education: Education Goals: Education classes will be provided on a variety of topics geared toward better understanding of heart health and risk factor modification. Participant will state understanding/return demonstration of topics presented as noted by education test scores.  Learning Barriers/Preferences: Learning Barriers/Preferences - 03/08/18 1515      Learning Barriers/Preferences   Learning Barriers  None    Learning Preferences  Individual Instruction;Skilled Demonstration;Group Instruction       Education Topics:  AED/CPR: - Group verbal and written instruction with the use of models to demonstrate the basic use of the AED with the basic ABC's of resuscitation.   General Nutrition Guidelines/Fats and Fiber: -Group instruction provided by verbal, written material, models and posters to present  the general guidelines for heart healthy nutrition. Gives an explanation and review of dietary fats and fiber.   Controlling Sodium/Reading Food Labels: -Group verbal and written material  supporting the discussion of sodium use in heart healthy nutrition. Review and explanation with models, verbal and written materials for utilization of the food label.   Exercise Physiology & General Exercise Guidelines: - Group verbal and written instruction with models to review the exercise physiology of the cardiovascular system and associated critical values. Provides general exercise guidelines with specific guidelines to those with heart or lung disease.    Aerobic Exercise & Resistance Training: - Gives group verbal and written instruction on the various components of exercise. Focuses on aerobic and resistive training programs and the benefits of this training and how to safely progress through these programs..   Flexibility, Balance, Mind/Body Relaxation: Provides group verbal/written instruction on the benefits of flexibility and balance training, including mind/body exercise modes such as yoga, pilates and tai chi.  Demonstration and skill practice provided.   Stress and Anxiety: - Provides group verbal and written instruction about the health risks of elevated stress and causes of high stress.  Discuss the correlation between heart/lung disease and anxiety and treatment options. Review healthy ways to manage with stress and anxiety.   Depression: - Provides group verbal and written instruction on the correlation between heart/lung disease and depressed mood, treatment options, and the stigmas associated with seeking treatment.   Anatomy & Physiology of the Heart: - Group verbal and written instruction and models provide basic cardiac anatomy and physiology, with the coronary electrical and arterial systems. Review of Valvular disease and Heart Failure   Cardiac Procedures: - Group verbal and written instruction to review commonly prescribed medications for heart disease. Reviews the medication, class of the drug, and side effects. Includes the steps to properly store meds and  maintain the prescription regimen. (beta blockers and nitrates)   Cardiac Medications I: - Group verbal and written instruction to review commonly prescribed medications for heart disease. Reviews the medication, class of the drug, and side effects. Includes the steps to properly store meds and maintain the prescription regimen.   Cardiac Medications II: -Group verbal and written instruction to review commonly prescribed medications for heart disease. Reviews the medication, class of the drug, and side effects. (all other drug classes)    Go Sex-Intimacy & Heart Disease, Get SMART - Goal Setting: - Group verbal and written instruction through game format to discuss heart disease and the return to sexual intimacy. Provides group verbal and written material to discuss and apply goal setting through the application of the S.M.A.R.T. Method.   Other Matters of the Heart: - Provides group verbal, written materials and models to describe Stable Angina and Peripheral Artery. Includes description of the disease process and treatment options available to the cardiac patient.   Exercise & Equipment Safety: - Individual verbal instruction and demonstration of equipment use and safety with use of the equipment.   Cardiac Rehab from 03/08/2018 in [facility] Cardiac and Pulmonary Rehab  Date  03/08/18  Educator  SB  Instruction Review Code  1- Verbalizes Understanding      Infection Prevention: - Provides verbal and written material to individual with discussion of infection control including proper hand washing and proper equipment cleaning during exercise session.   Cardiac Rehab from 03/08/2018 in [facility] Cardiac and Pulmonary Rehab  Date  03/08/18  Educator  SB  Instruction Review Code  1- Verbalizes Understanding  Falls Prevention: - Provides verbal and written material to individual with discussion of falls prevention and safety.   Cardiac Rehab from 03/08/2018 in [facility] Cardiac and Pulmonary  Rehab  Date  03/08/18  Educator  SB  Instruction Review Code  1- Verbalizes Understanding      Diabetes: - Individual verbal and written instruction to review signs/symptoms of diabetes, desired ranges of glucose level fasting, after meals and with exercise. Acknowledge that pre and post exercise glucose checks will be done for 3 sessions at entry of program.   Know Your Numbers and Risk Factors: -Group verbal and written instruction about important numbers in your health.  Discussion of what are risk factors and how they play a role in the disease process.  Review of Cholesterol, Blood Pressure, Diabetes, and BMI and the role they play in your overall health.   Sleep Hygiene: -Provides group verbal and written instruction about how sleep can affect your health.  Define sleep hygiene, discuss sleep cycles and impact of sleep habits. Review good sleep hygiene tips.    Other: -Provides group and verbal instruction on various topics (see comments)   Knowledge Questionnaire Score: Knowledge Questionnaire Score - 03/08/18 1427      Knowledge Questionnaire Score   Pre Score  24/26   Test reviewed with pt today.  Education Focus: Angina and Exercise      Core Components/Risk Factors/Patient Goals at Admission: Personal Goals and Risk Factors at Admission - 03/08/18 1515      Core Components/Risk Factors/Patient Goals on Admission    Weight Management  Yes;Weight Loss    Admit Weight  179 lb (81.2 kg)    Goal Weight: Short Term  177 lb (80.3 kg)    Goal Weight: Long Term  160 lb (72.6 kg)    Expected Outcomes  Short Term: Continue to assess and modify interventions until short term weight is achieved;Long Term: Adherence to nutrition and physical activity/exercise program aimed toward attainment of established weight goal;Weight Loss: Understanding of general recommendations for a balanced deficit meal plan, which promotes 1-2 lb weight loss per week and includes a negative energy  balance of [phone] kcal/d    Hypertension  Yes    Intervention  Provide education on lifestyle modifcations including regular physical activity/exercise, weight management, moderate sodium restriction and increased consumption of fresh fruit, vegetables, and low fat dairy, alcohol moderation, and smoking cessation.;Monitor prescription use compliance.    Expected Outcomes  Short Term: Continued assessment and intervention until BP is < 140/55m HG in hypertensive participants. < 130/84mHG in hypertensive participants with diabetes, heart failure or chronic kidney disease.;Long Term: Maintenance of blood pressure at goal levels.       Core Components/Risk Factors/Patient Goals Review:    Core Components/Risk Factors/Patient Goals at Discharge (Final Review):    ITP Comments: ITP Comments    Row Name 03/08/18 1507           ITP Comments  Medical evaluation completed today. ITP sent to Dr M Loleta Chanceor review,changes as needed and signature. [facility]          Comments: medical evaluation completed today

## 2018-03-16 ENCOUNTER — Encounter: Payer: Medicare Other | Attending: Internal Medicine

## 2018-03-16 DIAGNOSIS — Z952 Presence of prosthetic heart valve: Secondary | ICD-10-CM | POA: Diagnosis not present

## 2018-03-16 DIAGNOSIS — Z954 Presence of other heart-valve replacement: Secondary | ICD-10-CM

## 2018-03-16 NOTE — Progress Notes (Signed)
Daily Session Note  Patient Details  Name: NERISSA CONSTANTIN MRN: [id number] Date of Birth: [birth year] Referring Provider:     Cardiac Rehab from 03/08/2018 in [facility] Cardiac and Pulmonary Rehab  Referring Provider  Ward, Jeani Hawking MD      Encounter Date: 03/16/2018  Check In: Session Check In - 03/16/18 1641      Check-In   Supervising physician immediately available to respond to emergencies  See telemetry face sheet for immediately available ER MD    Location  [facility]-Cardiac & Pulmonary Rehab    Staff Present  Gerlene Burdock, RN, BSN;Meredith Sherryll Burger, RN Vickki Hearing, BA, ACSM CEP, Exercise Physiologist          Social History   Tobacco Use  Smoking Status Never Smoker  Smokeless Tobacco Never Used    Goals Met:  Independence with exercise equipment Exercise tolerated well No report of cardiac concerns or symptoms Strength training completed today  Goals Unmet:  Not Applicable  Comments: First full day of exercise!  Patient was oriented to gym and equipment including functions, settings, policies, and procedures.  Patient's individual exercise prescription and treatment plan were reviewed.  All starting workloads were established based on the results of the 6 minute walk test done at initial orientation visit.  The plan for exercise progression was also introduced and progression will be customized based on patient's performance and goals.    Dr. Emily Filbert is Medical Director for [name].

## 2018-03-17 DIAGNOSIS — Z952 Presence of prosthetic heart valve: Secondary | ICD-10-CM | POA: Diagnosis not present

## 2018-03-17 DIAGNOSIS — Z954 Presence of other heart-valve replacement: Secondary | ICD-10-CM

## 2018-03-17 NOTE — Progress Notes (Signed)
Daily Session Note  Patient Details  Name: SHAUNDA TIPPING MRN: [id number] Date of Birth: [birth year] Referring Provider:     Cardiac Rehab from 03/08/2018 in [facility]  Referring Provider  Ward, Jeani Hawking MD      Encounter Date: 03/17/2018  Check In: Session Check In - 03/17/18 1611      Check-In   Staff Present  Justin Mend RCP,RRT,BSRT;Meredith Sherryll Burger, RN Moises Blood, BS, ACSM CEP, Exercise Physiologist    Medication changes reported      No    Fall or balance concerns reported     No    Warm-up and Cool-down  Performed on first and last piece of equipment    Resistance Training Performed  Yes    VAD Patient?  No      Pain Assessment   Currently in Pain?  No/denies          Social History   Tobacco Use  Smoking Status Never Smoker  Smokeless Tobacco Never Used    Goals Met:  Independence with exercise equipment Exercise tolerated well No report of cardiac concerns or symptoms Strength training completed today  Goals Unmet:  Not Applicable  Comments: Pt able to follow exercise prescription today without complaint.  Will continue to monitor for progression.   Dr. Emily Filbert is Medical Director for [name].

## 2018-03-21 DIAGNOSIS — Z954 Presence of other heart-valve replacement: Secondary | ICD-10-CM

## 2018-03-21 DIAGNOSIS — Z952 Presence of prosthetic heart valve: Secondary | ICD-10-CM | POA: Diagnosis not present

## 2018-03-21 NOTE — Progress Notes (Signed)
Daily Session Note  Patient Details  Name: Joyce Evans MRN: [id number] Date of Birth: 1940/08/12 Referring Provider:     Cardiac Rehab from 03/08/2018 in [facility] Rehab  Referring Provider  Ward, Jeani Hawking MD      Encounter Date: 03/21/2018  Check In: Session Check In - 03/21/18 1718      Check-In   Supervising physician immediately available to respond to emergencies  See telemetry face sheet for immediately available ER MD    Location  [facility]-Cardiac & Pulmonary Rehab    Staff Present  Nada Maclachlan, BA, ACSM CEP, Exercise Physiologist;Carroll Enterkin, RN, Moises Blood, BS, ACSM CEP, Exercise Physiologist    Medication changes reported      No    Fall or balance concerns reported     No    Warm-up and Cool-down  Performed on first and last piece of equipment    Resistance Training Performed  Yes    VAD Patient?  No    PAD/SET Patient?  No      Pain Assessment   Currently in Pain?  No/denies    Multiple Pain Sites  No          Social History   Tobacco Use  Smoking Status Never Smoker  Smokeless Tobacco Never Used    Goals Met:  Independence with exercise equipment Exercise tolerated well No report of cardiac concerns or symptoms Strength training completed today  Goals Unmet:  Not Applicable  Comments: Pt able to follow exercise prescription today without complaint.  Will continue to monitor for progression.    Dr. Emily Filbert is Medical Director for [name].

## 2018-03-23 ENCOUNTER — Telehealth: Payer: Self-pay | Admitting: *Deleted

## 2018-03-23 ENCOUNTER — Encounter: Payer: Self-pay | Admitting: *Deleted

## 2018-03-23 DIAGNOSIS — Z954 Presence of other heart-valve replacement: Secondary | ICD-10-CM

## 2018-03-23 NOTE — Telephone Encounter (Signed)
Ms. Joyce Evans called to inform staff that she will not be in class today due to stomach issues. She hopes to return Monday.

## 2018-03-23 NOTE — Progress Notes (Signed)
Cardiac Individual Treatment Plan  Patient Details  Name: Joyce Evans MRN: [id number] Date of Birth: 1941/07/03 Referring Provider:     Cardiac Rehab from 03/08/2018 in [facility] Cardiac and Pulmonary Rehab  Referring Provider  Ward, Jeani Hawking MD      Initial Encounter Date:    Cardiac Rehab from 03/08/2018 in [facility] Cardiac and Pulmonary Rehab  Date  03/08/18      Visit Diagnosis: S/P tricuspid valve replacement  Patient's Home Medications on Admission:  Current Outpatient Medications:  .  acetaminophen (TYLENOL) 500 MG tablet, Take 500 mg by mouth every 6 (six) hours as needed., Disp: , Rfl:  .  ALPRAZolam (XANAX) 0.25 MG tablet, Take 0.125 mg by mouth at bedtime., Disp: , Rfl:  .  apixaban (ELIQUIS) 5 MG TABS tablet, TAKE 1 TABLET BY MOUTH TWO  TIMES DAILY, Disp: , Rfl:  .  brimonidine (ALPHAGAN P) 0.1 % SOLN, Place 1 drop into the right eye 2 (two) times daily., Disp: , Rfl:  .  Brinzolamide-Brimonidine (SIMBRINZA) 1-0.2 % SUSP, Place 1 drop into the left eye 2 (two) times daily., Disp: , Rfl:  .  Calcium-Phosphorus-Vitamin D (CITRACAL +D3 PO), Take by mouth daily., Disp: , Rfl:  .  Cholecalciferol (VITAMIN D3) 3000 units TABS, Take by mouth 2 (two) times daily., Disp: , Rfl:  .  famotidine-calcium carbonate-magnesium hydroxide (PEPCID COMPLETE) 10-800-165 MG chewable tablet, Chew 1 tablet by mouth daily as needed., Disp: , Rfl:  .  furosemide (LASIX) 20 MG tablet, Take by mouth., Disp: , Rfl:  .  multivitamin-iron-minerals-folic acid (CENTRUM) chewable tablet, Chew 1 tablet by mouth daily., Disp: , Rfl:  .  omeprazole (PRILOSEC) 20 MG capsule, Take 20 mg by mouth 2 (two) times daily. Reported on 08/28/2015, Disp: , Rfl:  .  potassium chloride (K-DUR) 10 MEQ tablet, Take by mouth., Disp: , Rfl:  .  Probiotic Product (PROBIOTIC DAILY PO), Take by mouth daily., Disp: , Rfl:  .  timolol (BETIMOL) 0.5 % ophthalmic solution, Place 1 drop into both eyes daily., Disp: , Rfl:   Past Medical  History: Past Medical History:  Diagnosis Date  . Arthritis    osteo - shoulders, fingers  . Atrial fibrillation ([place])   . Dizziness    thinks because of diuretic  . Family history of adverse reaction to anesthesia    daughter -PONV  . Gallstones 2016, 2017  . GERD (gastroesophageal reflux disease)   . Glaucoma   . Heart murmur    history of  . Hypertension   . Numbness in left leg    s/p hematoma  . Sciatica    right  . Seasonal allergies   . Skin cancer   . Tricuspid valve disorder    leak    Tobacco Use: Social History   Tobacco Use  Smoking Status Never Smoker  Smokeless Tobacco Never Used    Labs: Recent Review Flowsheet Data    There is no flowsheet data to display.       Exercise Target Goals: Exercise Program Goal: Individual exercise prescription set using results from initial 6 min walk test and THRR while considering  patient's activity barriers and safety.   Exercise Prescription Goal: Initial exercise prescription builds to 30-45 minutes a day of aerobic activity, 2-3 days per week.  Home exercise guidelines will be given to patient during program as part of exercise prescription that the participant will acknowledge.  Activity Barriers & Risk Stratification: Activity Barriers & Cardiac Risk Stratification - 03/08/18  1452      Activity Barriers & Cardiac Risk Stratification   Activity Barriers  Deconditioning;Muscular Weakness;Shortness of Breath;History of Falls;Balance Concerns;Joint Problems   R arm sore since surgery, L shoulder very little cartilidge, limited OM   Cardiac Risk Stratification  Moderate       6 Minute Walk: 6 Minute Walk    Row Name 03/08/18 1451         6 Minute Walk   Phase  Initial     Distance  1265 feet     Walk Time  6 minutes     # of Rest Breaks  0     MPH  2.4     METS  2.54     RPE  13     Perceived Dyspnea   1     VO2 Peak  8.89     Symptoms  Yes (comment)     Comments  SOB, twinge in low back      Resting HR  64 bpm     Resting BP  128/74     Resting Oxygen Saturation   99 %     Exercise Oxygen Saturation  during 6 min walk  94 %     Max Ex. HR  112 bpm     Max Ex. BP  154/84     2 Minute Post BP  146/64        Oxygen Initial Assessment:   Oxygen Re-Evaluation:   Oxygen Discharge (Final Oxygen Re-Evaluation):   Initial Exercise Prescription: Initial Exercise Prescription - 03/08/18 1400      Date of Initial Exercise RX and Referring Provider   Date  03/08/18    Referring Provider  Ward, Jeani Hawking MD      Treadmill   MPH  2    Grade  0    Minutes  15    METs  2.53      NuStep   Level  2    SPM  80    Minutes  15    METs  2.5      Recumbant Elliptical   Level  1    RPM  50    Minutes  15    METs  2.5      Prescription Details   Frequency (times per week)  3    Duration  Progress to 45 minutes of aerobic exercise without signs/symptoms of physical distress      Intensity   THRR 40-80% of Max Heartrate  96-129    Ratings of Perceived Exertion  11-13    Perceived Dyspnea  0-4      Progression   Progression  Continue to progress workloads to maintain intensity without signs/symptoms of physical distress.      Resistance Training   Training Prescription  Yes    Weight  3 lbs    Reps  10-15       Perform Capillary Blood Glucose checks as needed.  Exercise Prescription Changes: Exercise Prescription Changes    Row Name 03/08/18 1400 03/17/18 0800           Response to Exercise   Blood Pressure (Admit)  128/74  124/68      Blood Pressure (Exercise)  154/84  138/70      Blood Pressure (Exit)  146/64  120/62      Heart Rate (Admit)  64 bpm  66 bpm      Heart Rate (Exercise)  112 bpm  106 bpm  Heart Rate (Exit)  81 bpm  81 bpm      Oxygen Saturation (Admit)  99 %  -      Oxygen Saturation (Exercise)  94 %  -      Rating of Perceived Exertion (Exercise)  13  12      Perceived Dyspnea (Exercise)  1  -      Symptoms  SOB, low back twinge  -       Comments  walk test results  1st day      Duration  -  Progress to 45 minutes of aerobic exercise without signs/symptoms of physical distress      Intensity  -  THRR unchanged        Progression   Progression  -  Continue to progress workloads to maintain intensity without signs/symptoms of physical distress.      Average METs  -  2.3        Resistance Training   Training Prescription  -  Yes      Weight  -  3 lb      Reps  -  10-15        Treadmill   MPH  -  2      Grade  -  0      Minutes  -  15      METs  -  2.53        NuStep   Level  -  2      SPM  -  80      Minutes  -  15      METs  -  2.1         Exercise Comments: Exercise Comments    Row Name 03/16/18 1642           Exercise Comments  First full day of exercise!  Patient was oriented to gym and equipment including functions, settings, policies, and procedures.  Patient's individual exercise prescription and treatment plan were reviewed.  All starting workloads were established based on the results of the 6 minute walk test done at initial orientation visit.  The plan for exercise progression was also introduced and progression will be customized based on patient's performance and goals.          Exercise Goals and Review: Exercise Goals    Row Name 03/08/18 1457             Exercise Goals   Increase Physical Activity  Yes       Intervention  Provide advice, education, support and counseling about physical activity/exercise needs.;Develop an individualized exercise prescription for aerobic and resistive training based on initial evaluation findings, risk stratification, comorbidities and participant's personal goals.       Expected Outcomes  Short Term: Attend rehab on a regular basis to increase amount of physical activity.;Long Term: Add in home exercise to make exercise part of routine and to increase amount of physical activity.;Long Term: Exercising regularly at least 3-5 days a week.       Increase  Strength and Stamina  Yes       Intervention  Provide advice, education, support and counseling about physical activity/exercise needs.;Develop an individualized exercise prescription for aerobic and resistive training based on initial evaluation findings, risk stratification, comorbidities and participant's personal goals.       Expected Outcomes  Short Term: Increase workloads from initial exercise prescription for resistance, speed, and METs.;Short Term: Perform resistance training exercises routinely during  rehab and add in resistance training at home;Long Term: Improve cardiorespiratory fitness, muscular endurance and strength as measured by increased METs and functional capacity (6MWT)       Able to understand and use rate of perceived exertion (RPE) scale  Yes       Intervention  Provide education and explanation on how to use RPE scale       Expected Outcomes  Short Term: Able to use RPE daily in rehab to express subjective intensity level;Long Term:  Able to use RPE to guide intensity level when exercising independently       Able to understand and use Dyspnea scale  Yes       Intervention  Provide education and explanation on how to use Dyspnea scale       Expected Outcomes  Short Term: Able to use Dyspnea scale daily in rehab to express subjective sense of shortness of breath during exertion;Long Term: Able to use Dyspnea scale to guide intensity level when exercising independently       Knowledge and understanding of Target Heart Rate Range (THRR)  Yes       Intervention  Provide education and explanation of THRR including how the numbers were predicted and where they are located for reference       Expected Outcomes  Short Term: Able to use daily as guideline for intensity in rehab;Short Term: Able to state/look up THRR;Long Term: Able to use THRR to govern intensity when exercising independently       Able to check pulse independently  Yes       Intervention  Provide education and  demonstration on how to check pulse in carotid and radial arteries.;Review the importance of being able to check your own pulse for safety during independent exercise       Expected Outcomes  Short Term: Able to explain why pulse checking is important during independent exercise;Long Term: Able to check pulse independently and accurately       Understanding of Exercise Prescription  Yes       Intervention  Provide education, explanation, and written materials on patient's individual exercise prescription       Expected Outcomes  Short Term: Able to explain program exercise prescription;Long Term: Able to explain home exercise prescription to exercise independently          Exercise Goals Re-Evaluation : Exercise Goals Re-Evaluation    Row Name 03/16/18 1642             Exercise Goal Re-Evaluation   Exercise Goals Review  Increase Physical Activity;Able to understand and use rate of perceived exertion (RPE) scale;Knowledge and understanding of Target Heart Rate Range (THRR);Understanding of Exercise Prescription;Increase Strength and Stamina       Comments  Reviewed RPE scale, THR and program prescription with pt today.  Pt voiced understanding and was given a copy of goals to take home.        Expected Outcomes  Short: Use RPE daily to regulate intensity. Long: Follow program prescription in THR.          Discharge Exercise Prescription (Final Exercise Prescription Changes): Exercise Prescription Changes - 03/17/18 0800      Response to Exercise   Blood Pressure (Admit)  124/68    Blood Pressure (Exercise)  138/70    Blood Pressure (Exit)  120/62    Heart Rate (Admit)  66 bpm    Heart Rate (Exercise)  106 bpm    Heart Rate (Exit)  81 bpm  Rating of Perceived Exertion (Exercise)  12    Comments  1st day    Duration  Progress to 45 minutes of aerobic exercise without signs/symptoms of physical distress    Intensity  THRR unchanged      Progression   Progression  Continue to  progress workloads to maintain intensity without signs/symptoms of physical distress.    Average METs  2.3      Resistance Training   Training Prescription  Yes    Weight  3 lb    Reps  10-15      Treadmill   MPH  2    Grade  0    Minutes  15    METs  2.53      NuStep   Level  2    SPM  80    Minutes  15    METs  2.1       Nutrition:  Target Goals: Understanding of nutrition guidelines, daily intake of sodium <1518m, cholesterol <2068m calories 30% from fat and 7% or less from saturated fats, daily to have 5 or more servings of fruits and vegetables.  Biometrics: Pre Biometrics - 03/08/18 1458      Pre Biometrics   Height  5' 3.2" (1.605 m)    Weight  179 lb 8 oz (81.4 kg)    Waist Circumference  34 inches    Hip Circumference  43 inches    Waist to Hip Ratio  0.79 %    BMI (Calculated)  31.61    Single Leg Stand  1.81 seconds        Nutrition Therapy Plan and Nutrition Goals: Nutrition Therapy & Goals - 03/08/18 1514      Intervention Plan   Intervention  Prescribe, educate and counsel regarding individualized specific dietary modifications aiming towards targeted core components such as weight, hypertension, lipid management, diabetes, heart failure and other comorbidities.    Expected Outcomes  Short Term Goal: Understand basic principles of dietary content, such as calories, fat, sodium, cholesterol and nutrients.;Short Term Goal: A plan has been developed with personal nutrition goals set during dietitian appointment.;Long Term Goal: Adherence to prescribed nutrition plan.       Nutrition Assessments: Nutrition Assessments - 03/08/18 1428      MEDFICTS Scores   Pre Score  40       Nutrition Goals Re-Evaluation:   Nutrition Goals Discharge (Final Nutrition Goals Re-Evaluation):   Psychosocial: Target Goals: Acknowledge presence or absence of significant depression and/or stress, maximize coping skills, provide positive support system. Participant  is able to verbalize types and ability to use techniques and skills needed for reducing stress and depression.   Initial Review & Psychosocial Screening: Initial Psych Review & Screening - 03/08/18 1513      Initial Review   Current issues with  Current Stress Concerns    Source of Stress Concerns  Unable to perform yard/household activities    Comments  Wants to get back to her outside yard/garden.       Family Dynamics   Good Support System?  Yes   Spouse, 2 daughters, friends, 2 sisters     Barriers   Psychosocial barriers to participate in program  There are no identifiable barriers or psychosocial needs.      Screening Interventions   Interventions  Encouraged to exercise    Expected Outcomes  Short Term goal: Utilizing psychosocial counselor, staff and physician to assist with identification of specific Stressors or current issues interfering with  healing process. Setting desired goal for each stressor or current issue identified.;Long Term Goal: Stressors or current issues are controlled or eliminated.;Short Term goal: Identification and review with participant of any Quality of Life or Depression concerns found by scoring the questionnaire.;Long Term goal: The participant improves quality of Life and PHQ9 Scores as seen by post scores and/or verbalization of changes       Quality of Life Scores:  Quality of Life - 03/08/18 1428      Quality of Life   Select  Quality of Life      Quality of Life Scores   Health/Function Pre  26.8 %    Socioeconomic Pre  28.75 %    Psych/Spiritual Pre  29.14 %    Family Pre  28.8 %    GLOBAL Pre  27.95 %      Scores of 19 and below usually indicate a poorer quality of life in these areas.  A difference of  2-3 points is a clinically meaningful difference.  A difference of 2-3 points in the total score of the Quality of Life Index has been associated with significant improvement in overall quality of life, self-image, physical symptoms, and  general health in studies assessing change in quality of life.  PHQ-9: Recent Review Flowsheet Data    Depression screen [facility] 2/9 03/08/2018   Decreased Interest 1   Down, Depressed, Hopeless 0   PHQ - 2 Score 1   Altered sleeping 0   Tired, decreased energy 1   Change in appetite 0   Feeling bad or failure about yourself  0   Trouble concentrating 0   Moving slowly or fidgety/restless 0   Suicidal thoughts 0   PHQ-9 Score 2   Difficult doing work/chores Not difficult at all     Interpretation of Total Score  Total Score Depression Severity:  1-4 = Minimal depression, 5-9 = Mild depression, 10-14 = Moderate depression, 15-19 = Moderately severe depression, 20-27 = Severe depression   Psychosocial Evaluation and Intervention: Psychosocial Evaluation - 03/21/18 1702      Psychosocial Evaluation & Interventions   Interventions  Encouraged to exercise with the program and follow exercise prescription    Comments  Counselor met with Ms. Verdene Rio Bethena Roys) today for initial psychosocial evaluation.  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  Akesha has a strong support system with a spouse of 30 years; (2) adult daughters; (3) sisters locally and active involvement in her local church.  She has several other health conditions with osteoporosis; glaucoma; and GERD - in addition to her cardiac condition.  She reports sleeping well and having a good appetite.  Francille denies a history of depression or anxiety or any current symptoms; but reports she is on a low dose medication PRN that she takes when she can't sleep along with Melatonin.  Jalyiah reports her mood is typically positive and she has minimal stress in her life.  He has goals to increase her strength and improve her motivation to get back to normal activities.  She will be followed by staff.    Expected Outcomes  Short:  Aryianna will exercise consistently ot increase her strength and get back to more  normal activities.  Long:  Lawrencia will continue to practice healthy lifestyle choices for her health and mental health.      Continue Psychosocial Services   Follow up required by staff  Psychosocial Re-Evaluation:   Psychosocial Discharge (Final Psychosocial Re-Evaluation):   Vocational Rehabilitation: Provide vocational rehab assistance to qualifying candidates.   Vocational Rehab Evaluation & Intervention: Vocational Rehab - 03/08/18 1515      Initial Vocational Rehab Evaluation & Intervention   Assessment shows need for Vocational Rehabilitation  No       Education: Education Goals: Education classes will be provided on a variety of topics geared toward better understanding of heart health and risk factor modification. Participant will state understanding/return demonstration of topics presented as noted by education test scores.  Learning Barriers/Preferences: Learning Barriers/Preferences - 03/08/18 1515      Learning Barriers/Preferences   Learning Barriers  None    Learning Preferences  Individual Instruction;Skilled Demonstration;Group Instruction       Education Topics:  AED/CPR: - Group verbal and written instruction with the use of models to demonstrate the basic use of the AED with the basic ABC's of resuscitation.   General Nutrition Guidelines/Fats and Fiber: -Group instruction provided by verbal, written material, models and posters to present the general guidelines for heart healthy nutrition. Gives an explanation and review of dietary fats and fiber.   Cardiac Rehab from 03/21/2018 in [facility] Cardiac and Pulmonary Rehab  Date  03/21/18  Educator  LB  Instruction Review Code  1- Verbalizes Understanding      Controlling Sodium/Reading Food Labels: -Group verbal and written material supporting the discussion of sodium use in heart healthy nutrition. Review and explanation with models, verbal and written materials for utilization of the food  label.   Exercise Physiology & General Exercise Guidelines: - Group verbal and written instruction with models to review the exercise physiology of the cardiovascular system and associated critical values. Provides general exercise guidelines with specific guidelines to those with heart or lung disease.    Aerobic Exercise & Resistance Training: - Gives group verbal and written instruction on the various components of exercise. Focuses on aerobic and resistive training programs and the benefits of this training and how to safely progress through these programs..   Flexibility, Balance, Mind/Body Relaxation: Provides group verbal/written instruction on the benefits of flexibility and balance training, including mind/body exercise modes such as yoga, pilates and tai chi.  Demonstration and skill practice provided.   Stress and Anxiety: - Provides group verbal and written instruction about the health risks of elevated stress and causes of high stress.  Discuss the correlation between heart/lung disease and anxiety and treatment options. Review healthy ways to manage with stress and anxiety.   Depression: - Provides group verbal and written instruction on the correlation between heart/lung disease and depressed mood, treatment options, and the stigmas associated with seeking treatment.   Anatomy & Physiology of the Heart: - Group verbal and written instruction and models provide basic cardiac anatomy and physiology, with the coronary electrical and arterial systems. Review of Valvular disease and Heart Failure   Cardiac Procedures: - Group verbal and written instruction to review commonly prescribed medications for heart disease. Reviews the medication, class of the drug, and side effects. Includes the steps to properly store meds and maintain the prescription regimen. (beta blockers and nitrates)   Cardiac Medications I: - Group verbal and written instruction to review commonly  prescribed medications for heart disease. Reviews the medication, class of the drug, and side effects. Includes the steps to properly store meds and maintain the prescription regimen.   Cardiac Medications II: -Group verbal and written instruction to review commonly prescribed medications for heart disease.  Reviews the medication, class of the drug, and side effects. (all other drug classes)    Go Sex-Intimacy & Heart Disease, Get SMART - Goal Setting: - Group verbal and written instruction through game format to discuss heart disease and the return to sexual intimacy. Provides group verbal and written material to discuss and apply goal setting through the application of the S.M.A.R.T. Method.   Other Matters of the Heart: - Provides group verbal, written materials and models to describe Stable Angina and Peripheral Artery. Includes description of the disease process and treatment options available to the cardiac patient.   Exercise & Equipment Safety: - Individual verbal instruction and demonstration of equipment use and safety with use of the equipment.   Cardiac Rehab from 03/21/2018 in [facility] Cardiac and Pulmonary Rehab  Date  03/08/18  Educator  SB  Instruction Review Code  1- Verbalizes Understanding      Infection Prevention: - Provides verbal and written material to individual with discussion of infection control including proper hand washing and proper equipment cleaning during exercise session.   Cardiac Rehab from 03/21/2018 in [facility] Cardiac and Pulmonary Rehab  Date  03/08/18  Educator  SB  Instruction Review Code  1- Verbalizes Understanding      Falls Prevention: - Provides verbal and written material to individual with discussion of falls prevention and safety.   Cardiac Rehab from 03/21/2018 in [facility] Cardiac and Pulmonary Rehab  Date  03/08/18  Educator  SB  Instruction Review Code  1- Verbalizes Understanding      Diabetes: - Individual verbal and written  instruction to review signs/symptoms of diabetes, desired ranges of glucose level fasting, after meals and with exercise. Acknowledge that pre and post exercise glucose checks will be done for 3 sessions at entry of program.   Know Your Numbers and Risk Factors: -Group verbal and written instruction about important numbers in your health.  Discussion of what are risk factors and how they play a role in the disease process.  Review of Cholesterol, Blood Pressure, Diabetes, and BMI and the role they play in your overall health.   Sleep Hygiene: -Provides group verbal and written instruction about how sleep can affect your health.  Define sleep hygiene, discuss sleep cycles and impact of sleep habits. Review good sleep hygiene tips.    Cardiac Rehab from 03/21/2018 in [facility] Cardiac and Pulmonary Rehab  Date  03/16/18  Educator  [facility]  Instruction Review Code  1- Verbalizes Understanding      Other: -Provides group and verbal instruction on various topics (see comments)   Knowledge Questionnaire Score: Knowledge Questionnaire Score - 03/08/18 1427      Knowledge Questionnaire Score   Pre Score  24/26   Test reviewed with pt today.  Education Focus: Angina and Exercise      Core Components/Risk Factors/Patient Goals at Admission: Personal Goals and Risk Factors at Admission - 03/08/18 1515      Core Components/Risk Factors/Patient Goals on Admission    Weight Management  Yes;Weight Loss    Admit Weight  179 lb (81.2 kg)    Goal Weight: Short Term  177 lb (80.3 kg)    Goal Weight: Long Term  160 lb (72.6 kg)    Expected Outcomes  Short Term: Continue to assess and modify interventions until short term weight is achieved;Long Term: Adherence to nutrition and physical activity/exercise program aimed toward attainment of established weight goal;Weight Loss: Understanding of general recommendations for a balanced deficit meal plan, which promotes  1-2 lb weight loss per week and includes a  negative energy balance of [phone] kcal/d    Hypertension  Yes    Intervention  Provide education on lifestyle modifcations including regular physical activity/exercise, weight management, moderate sodium restriction and increased consumption of fresh fruit, vegetables, and low fat dairy, alcohol moderation, and smoking cessation.;Monitor prescription use compliance.    Expected Outcomes  Short Term: Continued assessment and intervention until BP is < 140/35m HG in hypertensive participants. < 130/887mHG in hypertensive participants with diabetes, heart failure or chronic kidney disease.;Long Term: Maintenance of blood pressure at goal levels.       Core Components/Risk Factors/Patient Goals Review:    Core Components/Risk Factors/Patient Goals at Discharge (Final Review):    ITP Comments: ITP Comments    Row Name 03/08/18 1507 03/23/18 0550         ITP Comments  Medical evaluation completed today. ITP sent to Dr M Loleta Chanceor review,changes as needed and signature. [facility]  30 day review completed. ITP sent to Dr. MaEmily FilbertMedical Director of Cardiac Rehab. Continue with ITP unless changes are made by physician  New to program         Comments:

## 2018-03-28 DIAGNOSIS — Z952 Presence of prosthetic heart valve: Secondary | ICD-10-CM | POA: Diagnosis not present

## 2018-03-28 DIAGNOSIS — Z954 Presence of other heart-valve replacement: Secondary | ICD-10-CM

## 2018-03-28 NOTE — Progress Notes (Signed)
Daily Session Note  Patient Details  Name: Joyce Evans MRN: [id number] Date of Birth: May 04, 1941 Referring Provider:     Cardiac Rehab from 03/08/2018 in [facility] Cardiac and Pulmonary Rehab  Referring Provider  Ward, Jeani Hawking MD      Encounter Date: 03/28/2018  Check In:      Social History   Tobacco Use  Smoking Status Never Smoker  Smokeless Tobacco Never Used    Goals Met:  Independence with exercise equipment Exercise tolerated well No report of cardiac concerns or symptoms Strength training completed today  Goals Unmet:  Not Applicable  Comments: Pt able to follow exercise prescription today without complaint.  Will continue to monitor for progression.    Dr. Emily Filbert is Medical Director for [place] and [name].

## 2018-03-30 ENCOUNTER — Encounter: Payer: Medicare Other | Admitting: *Deleted

## 2018-03-30 DIAGNOSIS — Z952 Presence of prosthetic heart valve: Secondary | ICD-10-CM | POA: Diagnosis not present

## 2018-03-30 DIAGNOSIS — Z954 Presence of other heart-valve replacement: Secondary | ICD-10-CM

## 2018-03-30 NOTE — Progress Notes (Signed)
Daily Session Note  Patient Details  Name: TEARRA OUK MRN: [id number] Date of Birth: May 04, 1941 Referring Provider:     Cardiac Rehab from 03/08/2018 in [facility] Cardiac and Pulmonary Rehab  Referring Provider  Ward, Jeani Hawking MD      Encounter Date: 03/30/2018  Check In: Session Check In - 03/30/18 1700      Check-In   Supervising physician immediately available to respond to emergencies  See telemetry face sheet for immediately available ER MD    Location  [facility]-Cardiac & Pulmonary Rehab    Staff Present  Renita Papa, RN Vickki Hearing, BA, ACSM CEP, Exercise Physiologist;Carroll Enterkin, RN, BSN    Medication changes reported      No    Fall or balance concerns reported     No    Warm-up and Cool-down  Performed on first and last piece of equipment    Resistance Training Performed  Yes    VAD Patient?  No    PAD/SET Patient?  No      Pain Assessment   Currently in Pain?  No/denies          Social History   Tobacco Use  Smoking Status Never Smoker  Smokeless Tobacco Never Used    Goals Met:  Independence with exercise equipment Exercise tolerated well No report of cardiac concerns or symptoms Strength training completed today  Goals Unmet:  Not Applicable  Comments: Pt able to follow exercise prescription today without complaint.  Will continue to monitor for progression.    Dr. Emily Filbert is Medical Director for [place] and [name].

## 2018-03-31 ENCOUNTER — Encounter: Payer: Medicare Other | Admitting: *Deleted

## 2018-03-31 DIAGNOSIS — Z952 Presence of prosthetic heart valve: Secondary | ICD-10-CM | POA: Diagnosis not present

## 2018-03-31 DIAGNOSIS — Z954 Presence of other heart-valve replacement: Secondary | ICD-10-CM

## 2018-03-31 NOTE — Progress Notes (Signed)
Daily Session Note  Patient Details  Name: Joyce Evans MRN: [id number] Date of Birth: 13-Sep-1940 Referring Provider:     Cardiac Rehab from 03/08/2018 in [facility] Cardiac and Pulmonary Rehab  Referring Provider  Ward, Jeani Hawking MD      Encounter Date: 03/31/2018  Check In: Session Check In - 03/31/18 1638      Check-In   Supervising physician immediately available to respond to emergencies  See telemetry face sheet for immediately available ER MD    Location  [facility]-Cardiac & Pulmonary Rehab    Staff Present  Renita Papa, RN BSN;Joseph [address] [place], [place], ACSM CEP, Exercise Physiologist    Medication changes reported      No    Fall or balance concerns reported     No    Tobacco Cessation  No Change    Warm-up and Cool-down  Performed on first and last piece of equipment    Resistance Training Performed  Yes    VAD Patient?  No    PAD/SET Patient?  No      Pain Assessment   Currently in Pain?  No/denies    Multiple Pain Sites  No          Social History   Tobacco Use  Smoking Status Never Smoker  Smokeless Tobacco Never Used    Goals Met:  Proper associated with RPD/PD & O2 Sat Independence with exercise equipment Exercise tolerated well No report of cardiac concerns or symptoms Strength training completed today  Goals Unmet:  Not Applicable  Comments: Pt able to follow exercise prescription today without complaint.  Will continue to monitor for progression.    Dr. Emily Filbert is Medical Director for [place] and [name].

## 2018-04-04 DIAGNOSIS — Z952 Presence of prosthetic heart valve: Secondary | ICD-10-CM | POA: Diagnosis not present

## 2018-04-04 DIAGNOSIS — Z954 Presence of other heart-valve replacement: Secondary | ICD-10-CM

## 2018-04-04 NOTE — Progress Notes (Signed)
Daily Session Note  Patient Details  Name: Joyce Evans MRN: [id number] Date of Birth: August 12, 1940 Referring Provider:     Cardiac Rehab from 03/08/2018 in [facility] Cardiac and Pulmonary Rehab  Referring Provider  Ward, Jeani Hawking MD      Encounter Date: 04/04/2018  Check In: Session Check In - 04/04/18 1716      Check-In   Supervising physician immediately available to respond to emergencies  See telemetry face sheet for immediately available ER MD    Staff Present  Nada Maclachlan, BA, ACSM CEP, Exercise Physiologist;Kelly Amedeo Plenty, BS, ACSM CEP, Exercise Physiologist;Carroll Enterkin, RN, BSN    Medication changes reported      No    Fall or balance concerns reported     No    Warm-up and Cool-down  Performed on first and last piece of equipment    Resistance Training Performed  Yes    VAD Patient?  No    PAD/SET Patient?  No      Pain Assessment   Currently in Pain?  No/denies    Multiple Pain Sites  No          Social History   Tobacco Use  Smoking Status Never Smoker  Smokeless Tobacco Never Used    Goals Met:  Independence with exercise equipment Exercise tolerated well No report of cardiac concerns or symptoms Strength training completed today  Goals Unmet:  Not Applicable  Comments: Pt able to follow exercise prescription today without complaint.  Will continue to monitor for progression.    Dr. Emily Filbert is Medical Director for [place] and [name].

## 2018-04-06 ENCOUNTER — Encounter: Payer: Medicare Other | Admitting: *Deleted

## 2018-04-06 DIAGNOSIS — Z954 Presence of other heart-valve replacement: Secondary | ICD-10-CM

## 2018-04-06 DIAGNOSIS — Z952 Presence of prosthetic heart valve: Secondary | ICD-10-CM | POA: Diagnosis not present

## 2018-04-06 NOTE — Progress Notes (Signed)
Daily Session Note  Patient Details  Name: Joyce Evans MRN: [id number] Date of Birth: 06/24/1941 Referring Provider:     Cardiac Rehab from 03/08/2018 in [facility] Cardiac and Pulmonary Rehab  Referring Provider  Ward, Jeani Hawking MD      Encounter Date: 04/06/2018  Check In: Session Check In - 04/06/18 1657      Check-In   Supervising physician immediately available to respond to emergencies  See telemetry face sheet for immediately available ER MD    Location  [facility]-Cardiac & Pulmonary Rehab    Staff Present  Gerlene Burdock, RN, Vickki Hearing, BA, ACSM CEP, Exercise Physiologist;Meredith Sherryll Burger, RN BSN    Medication changes reported      No    Fall or balance concerns reported     No    Tobacco Cessation  No Change    Warm-up and Cool-down  Performed on first and last piece of equipment    Resistance Training Performed  Yes    VAD Patient?  No    PAD/SET Patient?  No      Pain Assessment   Currently in Pain?  No/denies          Social History   Tobacco Use  Smoking Status Never Smoker  Smokeless Tobacco Never Used    Goals Met:  Proper associated with RPD/PD & O2 Sat Independence with exercise equipment Exercise tolerated well No report of cardiac concerns or symptoms Strength training completed today  Goals Unmet:  Not Applicable  Comments:     Dr. Emily Filbert is Medical Director for [place] and [name].

## 2018-04-13 ENCOUNTER — Encounter: Payer: Medicare Other | Attending: Internal Medicine

## 2018-04-13 DIAGNOSIS — Z952 Presence of prosthetic heart valve: Secondary | ICD-10-CM | POA: Insufficient documentation

## 2018-04-13 DIAGNOSIS — Z954 Presence of other heart-valve replacement: Secondary | ICD-10-CM

## 2018-04-13 NOTE — Progress Notes (Signed)
Daily Session Note  Patient Details  Name: Joyce Evans MRN: [id number] Date of Birth: [birth year] Referring Provider:     Cardiac Rehab from 03/08/2018 in [facility] Cardiac and Pulmonary Rehab  Referring Provider  Ward, Jeani Hawking MD      Encounter Date: 04/13/2018  Check In: Session Check In - 04/13/18 1638      Check-In   Supervising physician immediately available to respond to emergencies  See telemetry face sheet for immediately available ER MD    Location  [facility]-Cardiac & Pulmonary Rehab    Staff Present  Renita Papa, RN Vickki Hearing, BA, ACSM CEP, Exercise Physiologist;Susanne Bice, RN, BSN, CCRP    Medication changes reported      No    Fall or balance concerns reported     No    Warm-up and Cool-down  Performed on first and last piece of equipment    Resistance Training Performed  Yes    VAD Patient?  No    PAD/SET Patient?  No      Pain Assessment   Currently in Pain?  No/denies    Multiple Pain Sites  No          Social History   Tobacco Use  Smoking Status Never Smoker  Smokeless Tobacco Never Used    Goals Met:  Independence with exercise equipment Exercise tolerated well No report of cardiac concerns or symptoms Strength training completed today  Goals Unmet:  Not Applicable  Comments:Reviewed home exercise with pt today.  Pt plans to go to [facility] and [facility] for exercise.  Reviewed THR, pulse, RPE, sign and symptoms, NTG use, and when to call 911 or MD.  Also discussed weather considerations and indoor options.  Pt voiced understanding.   Dr. Emily Filbert is Medical Director for [place] and [name].

## 2018-04-14 DIAGNOSIS — Z952 Presence of prosthetic heart valve: Secondary | ICD-10-CM | POA: Diagnosis not present

## 2018-04-14 DIAGNOSIS — Z954 Presence of other heart-valve replacement: Secondary | ICD-10-CM

## 2018-04-14 NOTE — Progress Notes (Signed)
Daily Session Note  Patient Details  Name: Joyce Evans MRN: [id number] Date of Birth: 12-15-1940 Referring Provider:     Cardiac Rehab from 03/08/2018 in [facility] Cardiac and Pulmonary Rehab  Referring Provider  Ward, Jeani Hawking MD      Encounter Date: 04/14/2018  Check In: Session Check In - 04/14/18 1614      Check-In   Staff Present  Renita Papa, RN BSN;Joseph [address] [place], [place], ACSM CEP, Exercise Physiologist    Medication changes reported      No    Fall or balance concerns reported     No    Warm-up and Cool-down  Performed on first and last piece of equipment    Resistance Training Performed  Yes    VAD Patient?  No    PAD/SET Patient?  No      Pain Assessment   Currently in Pain?  No/denies          Social History   Tobacco Use  Smoking Status Never Smoker  Smokeless Tobacco Never Used    Goals Met:  Independence with exercise equipment Exercise tolerated well No report of cardiac concerns or symptoms Strength training completed today  Goals Unmet:  Not Applicable  Comments: Pt able to follow exercise prescription today without complaint.  Will continue to monitor for progression.    Dr. Emily Filbert is Medical Director for [place] and [name].

## 2018-04-18 DIAGNOSIS — Z954 Presence of other heart-valve replacement: Secondary | ICD-10-CM

## 2018-04-18 DIAGNOSIS — Z952 Presence of prosthetic heart valve: Secondary | ICD-10-CM | POA: Diagnosis not present

## 2018-04-18 NOTE — Progress Notes (Signed)
Daily Session Note  Patient Details  Name: Joyce Evans MRN: [id number] Date of Birth: 1941/05/15 Referring Provider:     Cardiac Rehab from 03/08/2018 in [facility] Cardiac and Pulmonary Rehab  Referring Provider  Ward, Jeani Hawking MD      Encounter Date: 04/18/2018  Check In: Session Check In - 04/18/18 1721      Check-In   Supervising physician immediately available to respond to emergencies  See telemetry face sheet for immediately available ER MD    Location  [facility]-Cardiac & Pulmonary Rehab    Staff Present  Renita Papa, RN Vickki Hearing, BA, ACSM CEP, Exercise Physiologist;Kelly Amedeo Plenty, BS, ACSM CEP, Exercise Physiologist;Carroll Enterkin, RN, BSN    Medication changes reported      No    Fall or balance concerns reported     No    Tobacco Cessation  No Change    Warm-up and Cool-down  Performed on first and last piece of equipment    Resistance Training Performed  Yes    VAD Patient?  No    PAD/SET Patient?  No      Pain Assessment   Currently in Pain?  No/denies    Multiple Pain Sites  No          Social History   Tobacco Use  Smoking Status Never Smoker  Smokeless Tobacco Never Used    Goals Met:  Independence with exercise equipment Exercise tolerated well No report of cardiac concerns or symptoms Strength training completed today  Goals Unmet:  Not Applicable  Comments: Pt able to follow exercise prescription today without complaint.  Will continue to monitor for progression.    Dr. Emily Filbert is Medical Director for [place] and [name].

## 2018-04-20 ENCOUNTER — Encounter: Payer: Self-pay | Admitting: *Deleted

## 2018-04-20 ENCOUNTER — Encounter: Payer: Medicare Other | Admitting: *Deleted

## 2018-04-20 DIAGNOSIS — Z954 Presence of other heart-valve replacement: Secondary | ICD-10-CM

## 2018-04-20 DIAGNOSIS — Z952 Presence of prosthetic heart valve: Secondary | ICD-10-CM | POA: Diagnosis not present

## 2018-04-20 NOTE — Progress Notes (Signed)
Cardiac Individual Treatment Plan  Patient Details  Name: Joyce Evans MRN: [id number] Date of Birth: 1941/07/03 Referring Provider:     Cardiac Rehab from 03/08/2018 in [facility] Cardiac and Pulmonary Rehab  Referring Provider  Ward, Jeani Hawking MD      Initial Encounter Date:    Cardiac Rehab from 03/08/2018 in [facility] Cardiac and Pulmonary Rehab  Date  03/08/18      Visit Diagnosis: S/P tricuspid valve replacement  Patient's Home Medications on Admission:  Current Outpatient Medications:  .  acetaminophen (TYLENOL) 500 MG tablet, Take 500 mg by mouth every 6 (six) hours as needed., Disp: , Rfl:  .  ALPRAZolam (XANAX) 0.25 MG tablet, Take 0.125 mg by mouth at bedtime., Disp: , Rfl:  .  apixaban (ELIQUIS) 5 MG TABS tablet, TAKE 1 TABLET BY MOUTH TWO  TIMES DAILY, Disp: , Rfl:  .  brimonidine (ALPHAGAN P) 0.1 % SOLN, Place 1 drop into the right eye 2 (two) times daily., Disp: , Rfl:  .  Brinzolamide-Brimonidine (SIMBRINZA) 1-0.2 % SUSP, Place 1 drop into the left eye 2 (two) times daily., Disp: , Rfl:  .  Calcium-Phosphorus-Vitamin D (CITRACAL +D3 PO), Take by mouth daily., Disp: , Rfl:  .  Cholecalciferol (VITAMIN D3) 3000 units TABS, Take by mouth 2 (two) times daily., Disp: , Rfl:  .  famotidine-calcium carbonate-magnesium hydroxide (PEPCID COMPLETE) 10-800-165 MG chewable tablet, Chew 1 tablet by mouth daily as needed., Disp: , Rfl:  .  furosemide (LASIX) 20 MG tablet, Take by mouth., Disp: , Rfl:  .  multivitamin-iron-minerals-folic acid (CENTRUM) chewable tablet, Chew 1 tablet by mouth daily., Disp: , Rfl:  .  omeprazole (PRILOSEC) 20 MG capsule, Take 20 mg by mouth 2 (two) times daily. Reported on 08/28/2015, Disp: , Rfl:  .  potassium chloride (K-DUR) 10 MEQ tablet, Take by mouth., Disp: , Rfl:  .  Probiotic Product (PROBIOTIC DAILY PO), Take by mouth daily., Disp: , Rfl:  .  timolol (BETIMOL) 0.5 % ophthalmic solution, Place 1 drop into both eyes daily., Disp: , Rfl:   Past Medical  History: Past Medical History:  Diagnosis Date  . Arthritis    osteo - shoulders, fingers  . Atrial fibrillation ([place])   . Dizziness    thinks because of diuretic  . Family history of adverse reaction to anesthesia    daughter -PONV  . Gallstones 2016, 2017  . GERD (gastroesophageal reflux disease)   . Glaucoma   . Heart murmur    history of  . Hypertension   . Numbness in left leg    s/p hematoma  . Sciatica    right  . Seasonal allergies   . Skin cancer   . Tricuspid valve disorder    leak    Tobacco Use: Social History   Tobacco Use  Smoking Status Never Smoker  Smokeless Tobacco Never Used    Labs: Recent Review Flowsheet Data    There is no flowsheet data to display.       Exercise Target Goals: Exercise Program Goal: Individual exercise prescription set using results from initial 6 min walk test and THRR while considering  patient's activity barriers and safety.   Exercise Prescription Goal: Initial exercise prescription builds to 30-45 minutes a day of aerobic activity, 2-3 days per week.  Home exercise guidelines will be given to patient during program as part of exercise prescription that the participant will acknowledge.  Activity Barriers & Risk Stratification: Activity Barriers & Cardiac Risk Stratification - 03/08/18  1452      Activity Barriers & Cardiac Risk Stratification   Activity Barriers  Deconditioning;Muscular Weakness;Shortness of Breath;History of Falls;Balance Concerns;Joint Problems   R arm sore since surgery, L shoulder very little cartilidge, limited OM   Cardiac Risk Stratification  Moderate       6 Minute Walk: 6 Minute Walk    Row Name 03/08/18 1451         6 Minute Walk   Phase  Initial     Distance  1265 feet     Walk Time  6 minutes     # of Rest Breaks  0     MPH  2.4     METS  2.54     RPE  13     Perceived Dyspnea   1     VO2 Peak  8.89     Symptoms  Yes (comment)     Comments  SOB, twinge in low back      Resting HR  64 bpm     Resting BP  128/74     Resting Oxygen Saturation   99 %     Exercise Oxygen Saturation  during 6 min walk  94 %     Max Ex. HR  112 bpm     Max Ex. BP  154/84     2 Minute Post BP  146/64        Oxygen Initial Assessment:   Oxygen Re-Evaluation:   Oxygen Discharge (Final Oxygen Re-Evaluation):   Initial Exercise Prescription: Initial Exercise Prescription - 03/08/18 1400      Date of Initial Exercise RX and Referring Provider   Date  03/08/18    Referring Provider  Ward, Jeani Hawking MD      Treadmill   MPH  2    Grade  0    Minutes  15    METs  2.53      NuStep   Level  2    SPM  80    Minutes  15    METs  2.5      Recumbant Elliptical   Level  1    RPM  50    Minutes  15    METs  2.5      Prescription Details   Frequency (times per week)  3    Duration  Progress to 45 minutes of aerobic exercise without signs/symptoms of physical distress      Intensity   THRR 40-80% of Max Heartrate  96-129    Ratings of Perceived Exertion  11-13    Perceived Dyspnea  0-4      Progression   Progression  Continue to progress workloads to maintain intensity without signs/symptoms of physical distress.      Resistance Training   Training Prescription  Yes    Weight  3 lbs    Reps  10-15       Perform Capillary Blood Glucose checks as needed.  Exercise Prescription Changes: Exercise Prescription Changes    Row Name 03/08/18 1400 03/17/18 0800 03/30/18 1200 04/13/18 1700 04/14/18 0800     Response to Exercise   Blood Pressure (Admit)  128/74  124/68  116/60  -  124/66   Blood Pressure (Exercise)  154/84  138/70  160/80  -  118/68   Blood Pressure (Exit)  146/64  120/62  130/70  -  104/56   Heart Rate (Admit)  64 bpm  66 bpm  70 bpm  -  101 bpm   Heart Rate (Exercise)  112 bpm  106 bpm  118 bpm  -  98 bpm   Heart Rate (Exit)  81 bpm  81 bpm  83 bpm  -  70 bpm   Oxygen Saturation (Admit)  99 %  -  -  -  -   Oxygen Saturation (Exercise)  94 %  -   -  -  -   Rating of Perceived Exertion (Exercise)  '13  12  11  '$ -  -   Perceived Dyspnea (Exercise)  1  -  -  -  -   Symptoms  SOB, low back twinge  -  -  -  none   Comments  walk test results  1st day  -  -  -   Duration  -  Progress to 45 minutes of aerobic exercise without signs/symptoms of physical distress  Progress to 45 minutes of aerobic exercise without signs/symptoms of physical distress  -  Progress to 45 minutes of aerobic exercise without signs/symptoms of physical distress   Intensity  -  THRR unchanged  THRR unchanged  -  THRR unchanged     Progression   Progression  -  Continue to progress workloads to maintain intensity without signs/symptoms of physical distress.  Continue to progress workloads to maintain intensity without signs/symptoms of physical distress.  -  Continue to progress workloads to maintain intensity without signs/symptoms of physical distress.   Average METs  -  2.3  2.2  -  -     Resistance Training   Training Prescription  -  Yes  Yes  -  Yes   Weight  -  3 lb  3 lb  -  3 lb   Reps  -  10-15  10-15  -  10-15     Interval Training   Interval Training  -  -  No  -  -     Treadmill   MPH  -  2  2  -  2   Grade  -  0  0  -  0.5   Minutes  -  15  15  -  15   METs  -  2.53  2.53  -  2.67     NuStep   Level  -  2  2  -  3   SPM  -  80  80  -  80   Minutes  -  15  15  -  15   METs  -  2.1  1.8  -  1.8     Home Exercise Plan   Plans to continue exercise at  -  -  -  [employer] (comment) [employer] [facility] [employer] [facility] [employer] [facility] [employer]   Frequency  -  -  -  Add 2 additional days to program exercise sessions.  Add 2 additional days to program exercise sessions.   Initial Home Exercises Provided  -  -  -  04/13/18  04/13/18      Exercise Comments: Exercise Comments    Row Name 03/16/18 1642 04/13/18 1749         Exercise Comments  First full day of exercise!  Patient was oriented to gym and equipment including functions, settings,  policies, and procedures.  Patient's individual exercise prescription and treatment plan were reviewed.  All starting workloads were established based on the results of the 6 minute walk test done  at initial orientation visit.  The plan for exercise progression was also introduced and progression will be customized based on patient's performance and goals.  Reviewed home exercise with pt today.  Pt plans to go to [facility] and [employer] for exercise.  Reviewed THR, pulse, RPE, sign and symptoms, NTG use, and when to call 911 or MD.  Also discussed weather considerations and indoor options.  Pt voiced understanding.         Exercise Goals and Review: Exercise Goals    Row Name 03/08/18 1457             Exercise Goals   Increase Physical Activity  Yes       Intervention  Provide advice, education, support and counseling about physical activity/exercise needs.;Develop an individualized exercise prescription for aerobic and resistive training based on initial evaluation findings, risk stratification, comorbidities and participant's personal goals.       Expected Outcomes  Short Term: Attend rehab on a regular basis to increase amount of physical activity.;Long Term: Add in home exercise to make exercise part of routine and to increase amount of physical activity.;Long Term: Exercising regularly at least 3-5 days a week.       Increase Strength and Stamina  Yes       Intervention  Provide advice, education, support and counseling about physical activity/exercise needs.;Develop an individualized exercise prescription for aerobic and resistive training based on initial evaluation findings, risk stratification, comorbidities and participant's personal goals.       Expected Outcomes  Short Term: Increase workloads from initial exercise prescription for resistance, speed, and METs.;Short Term: Perform resistance training exercises routinely during rehab and add in resistance training at home;Long Term: Improve  cardiorespiratory fitness, muscular endurance and strength as measured by increased METs and functional capacity (6MWT)       Able to understand and use rate of perceived exertion (RPE) scale  Yes       Intervention  Provide education and explanation on how to use RPE scale       Expected Outcomes  Short Term: Able to use RPE daily in rehab to express subjective intensity level;Long Term:  Able to use RPE to guide intensity level when exercising independently       Able to understand and use Dyspnea scale  Yes       Intervention  Provide education and explanation on how to use Dyspnea scale       Expected Outcomes  Short Term: Able to use Dyspnea scale daily in rehab to express subjective sense of shortness of breath during exertion;Long Term: Able to use Dyspnea scale to guide intensity level when exercising independently       Knowledge and understanding of Target Heart Rate Range (THRR)  Yes       Intervention  Provide education and explanation of THRR including how the numbers were predicted and where they are located for reference       Expected Outcomes  Short Term: Able to use daily as guideline for intensity in rehab;Short Term: Able to state/look up THRR;Long Term: Able to use THRR to govern intensity when exercising independently       Able to check pulse independently  Yes       Intervention  Provide education and demonstration on how to check pulse in carotid and radial arteries.;Review the importance of being able to check your own pulse for safety during independent exercise       Expected Outcomes  Short Term: Able  to explain why pulse checking is important during independent exercise;Long Term: Able to check pulse independently and accurately       Understanding of Exercise Prescription  Yes       Intervention  Provide education, explanation, and written materials on patient's individual exercise prescription       Expected Outcomes  Short Term: Able to explain program exercise  prescription;Long Term: Able to explain home exercise prescription to exercise independently          Exercise Goals Re-Evaluation : Exercise Goals Re-Evaluation    Row Name 03/16/18 1642 03/30/18 1249 04/14/18 0813         Exercise Goal Re-Evaluation   Exercise Goals Review  Increase Physical Activity;Able to understand and use rate of perceived exertion (RPE) scale;Knowledge and understanding of Target Heart Rate Range (THRR);Understanding of Exercise Prescription;Increase Strength and Stamina  Increase Physical Activity;Increase Strength and Stamina;Able to understand and use rate of perceived exertion (RPE) scale  Increase Physical Activity;Increase Strength and Stamina;Able to understand and use rate of perceived exertion (RPE) scale;Knowledge and understanding of Target Heart Rate Range (THRR);Able to check pulse independently     Comments  Reviewed RPE scale, THR and program prescription with pt today.  Pt voiced understanding and was given a copy of goals to take home.   Joyce Evans has just started HT this month.  She will see more improvement if she attends consistently.  She has maintained MET level so far.  Joyce Evans is a member at [employer].  She plans [facility] join [employer] [facility]       Expected Outcomes  Short: Use RPE daily to regulate intensity. Long: Follow program prescription in THR.  SHort - attend 3 days per week Long - increase MET level  Short - exercise on her own  1-2 days outside HT Long - exercise independently 3-5 times per week        Discharge Exercise Prescription (Final Exercise Prescription Changes): Exercise Prescription Changes - 04/14/18 0800      Response to Exercise   Blood Pressure (Admit)  124/66    Blood Pressure (Exercise)  118/68    Blood Pressure (Exit)  104/56    Heart Rate (Admit)  101 bpm    Heart Rate (Exercise)  98 bpm    Heart Rate (Exit)  70 bpm    Symptoms  none    Duration  Progress to 45 minutes of aerobic exercise  without signs/symptoms of physical distress    Intensity  THRR unchanged      Progression   Progression  Continue to progress workloads to maintain intensity without signs/symptoms of physical distress.      Resistance Training   Training Prescription  Yes    Weight  3 lb    Reps  10-15      Treadmill   MPH  2    Grade  0.5    Minutes  15    METs  2.67      NuStep   Level  3    SPM  80    Minutes  15    METs  1.8      Home Exercise Plan   Plans to continue exercise at  [employer] (comment)   [employer] [facility] [employer]   Frequency  Add 2 additional days to program exercise sessions.    Initial Home Exercises Provided  04/13/18       Nutrition:  Target Goals:  Understanding of nutrition guidelines, daily intake of sodium '1500mg'$ , cholesterol '200mg'$ , calories 30% from fat and 7% or less from saturated fats, daily to have 5 or more servings of fruits and vegetables.  Biometrics: Pre Biometrics - 03/08/18 1458      Pre Biometrics   Height  5' 3.2" (1.605 m)    Weight  179 lb 8 oz (81.4 kg)    Waist Circumference  34 inches    Hip Circumference  43 inches    Waist to Hip Ratio  0.79 %    BMI (Calculated)  31.61    Single Leg Stand  1.81 seconds        Nutrition Therapy Plan and Nutrition Goals: Nutrition Therapy & Goals - 03/30/18 1631      Nutrition Therapy   Diet  DASH/ TLC    Protein (specify units)  6oz    Fiber  30 grams    Whole Grain Foods  3 servings   chooses whole grains   Saturated Fats  12 max. grams    Fruits and Vegetables  6 servings/day   8 ideal   Sodium  1500 grams      Personal Nutrition Goals   Nutrition Goal  Be more mindful of the sodium content in foods by starting to read nutrition facts labels and make more informed choices when eating out. When eating at [facility], you can look up the nutrition information ahead of time and plan what low-sodium options you will choose    Personal Goal #2  As you continue to recover and regain more energy, work  to eat at home more often to cut back on hidden sources of sodium, fat and sugar     Personal Goal #3  Continue to choose whole grain foods and to consume sources of healthy fat, such as your avocado oil and nuts- great job!    Comments  She has been cooking at home less often since her surgery at the end of July. Her husband is a diabetic and so the two of them work together to have a lower sugar diet. They struggle to monitor sodium intake but do enjoy a variety of fruits, vegetables, protein sources and grains. She enjoys yogurt as well.      Intervention Plan   Intervention  Prescribe, educate and counsel regarding individualized specific dietary modifications aiming towards targeted core components such as weight, hypertension, lipid management, diabetes, heart failure and other comorbidities.;Nutrition handout(s) given to patient.   sodium, food labels, and dining out packet   Expected Outcomes  Short Term Goal: Understand basic principles of dietary content, such as calories, fat, sodium, cholesterol and nutrients.;Short Term Goal: A plan has been developed with personal nutrition goals set during dietitian appointment.;Long Term Goal: Adherence to prescribed nutrition plan.       Nutrition Assessments: Nutrition Assessments - 03/08/18 1428      MEDFICTS Scores   Pre Score  40       Nutrition Goals Re-Evaluation: Nutrition Goals Re-Evaluation    Joyce Evans Name 03/30/18 1649 04/18/18 1658           Goals   Nutrition Goal  Be more mindful of the sodium content in foods by starting to read nutrition facts labels and make more informed choices when eating out. When eating at [facility], you can look up the nutrition information ahead of time and plan what low-sodium options you will choose  Be more mindful of the sodium content in foods and start to  read food labels more often; continue to choose whole grains and sources of heart healthy fats; start to eat at home more often as you start to regain  energy levels      Comment  She c/o struggling to monitor sodium in her diet and needs education on how to reduce her overall salt intake  She has started to read food labels, often aiming for food choices with sodium content in the double-digits per serving and bringing snacks on trips rather than stopping at fast food locations. She purchased low salt swiss cheese and Triscuit crackers, as well as fruits for snacks. She purchased [employer] and Stevia natural zero calorie sweeteners to use in items like tea and oatmeal. She and her husband are working to eat more fruits and vegetables and lower sugar intake as her husband is a diabetic. Reports her wt to be up slightly this week d/t d/c of lasix      Expected Outcome  She will read food labels consistently to identify high and low sodium items, and cook at home more often to better monitor her sodium intake  She will continue to monitor sodium intake more closely, aiming for foods with '140mg'$  of sodium per serving or less. Continue to increase fruit and vegetable and decrease added sugar intake. She will work on eating at home more in the near future and be mindful of high-sodium choices when eating out in the mean time        Personal Goal #2 Re-Evaluation   Personal Goal #2  Continue to choose whole grain foods and to consume sources of healthy fat, such as your avocado oil and nuts- great job!  -        Personal Goal #3 Re-Evaluation   Personal Goal #3  As your continue to recover and regain more energy, work to eat at home more often to cut back on hidden sources of sodium, fat and sugar  -         Nutrition Goals Discharge (Final Nutrition Goals Re-Evaluation): Nutrition Goals Re-Evaluation - 04/18/18 1658      Goals   Nutrition Goal  Be more mindful of the sodium content in foods and start to read food labels more often; continue to choose whole grains and sources of heart healthy fats; start to eat at home more often as you start to regain  energy levels    Comment  She has started to read food labels, often aiming for food choices with sodium content in the double-digits per serving and bringing snacks on trips rather than stopping at fast food locations. She purchased low salt swiss cheese and Triscuit crackers, as well as fruits for snacks. She purchased [employer] and [employer] natural zero calorie sweeteners to use in items like tea and oatmeal. She and her husband are working to eat more fruits and vegetables and lower sugar intake as her husband is a diabetic. Reports her wt to be up slightly this week d/t d/c of lasix    Expected Outcome  She will continue to monitor sodium intake more closely, aiming for foods with '140mg'$  of sodium per serving or less. Continue to increase fruit and vegetable and decrease added sugar intake. She will work on eating at home more in the near future and be mindful of high-sodium choices when eating out in the mean time       Psychosocial: Target Goals: Acknowledge presence or absence of significant depression and/or stress, maximize coping skills, provide  positive support system. Participant is able to verbalize types and ability to use techniques and skills needed for reducing stress and depression.   Initial Review & Psychosocial Screening: Initial Psych Review & Screening - 03/08/18 1513      Initial Review   Current issues with  Current Stress Concerns    Source of Stress Concerns  Unable to perform yard/household activities    Comments  Wants to get back to her outside yard/garden.       Family Dynamics   Good Support System?  Yes   Spouse, 2 daughters, friends, 2 sisters     Barriers   Psychosocial barriers to participate in program  There are no identifiable barriers or psychosocial needs.      Screening Interventions   Interventions  Encouraged to exercise    Expected Outcomes  Short Term goal: Utilizing psychosocial counselor, staff and physician to assist with identification of  specific Stressors or current issues interfering with healing process. Setting desired goal for each stressor or current issue identified.;Long Term Goal: Stressors or current issues are controlled or eliminated.;Short Term goal: Identification and review with participant of any Quality of Life or Depression concerns found by scoring the questionnaire.;Long Term goal: The participant improves quality of Life and PHQ9 Scores as seen by post scores and/or verbalization of changes       Quality of Life Scores:  Quality of Life - 03/08/18 1428      Quality of Life   Select  Quality of Life      Quality of Life Scores   Health/Function Pre  26.8 %    Socioeconomic Pre  28.75 %    Psych/Spiritual Pre  29.14 %    Family Pre  28.8 %    GLOBAL Pre  27.95 %      Scores of 19 and below usually indicate a poorer quality of life in these areas.  A difference of  2-3 points is a clinically meaningful difference.  A difference of 2-3 points in the total score of the Quality of Life Index has been associated with significant improvement in overall quality of life, self-image, physical symptoms, and general health in studies assessing change in quality of life.  PHQ-9: Recent Review Flowsheet Data    Depression screen [facility] 2/9 03/08/2018   Decreased Interest 1   Down, Depressed, Hopeless 0   PHQ - 2 Score 1   Altered sleeping 0   Tired, decreased energy 1   Change in appetite 0   Feeling bad or failure about yourself  0   Trouble concentrating 0   Moving slowly or fidgety/restless 0   Suicidal thoughts 0   PHQ-9 Score 2   Difficult doing work/chores Not difficult at all     Interpretation of Total Score  Total Score Depression Severity:  1-4 = Minimal depression, 5-9 = Mild depression, 10-14 = Moderate depression, 15-19 = Moderately severe depression, 20-27 = Severe depression   Psychosocial Evaluation and Intervention: Psychosocial Evaluation - 03/21/18 1702      Psychosocial Evaluation &  Interventions   Interventions  Encouraged to exercise with the program and follow exercise prescription    Comments  Counselor met with Ms. Joyce Rio Bethena Roys) today for initial psychosocial evaluation.  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  Honest has a strong support system with a spouse of 41 years; (2) adult daughters; (3) sisters locally and active involvement in her  local church.  She has several other health conditions with osteoporosis; glaucoma; and GERD - in addition to her cardiac condition.  She reports sleeping well and having a good appetite.  Lamaya denies a history of depression or anxiety or any current symptoms; but reports she is on a low dose medication PRN that she takes when she can't sleep along with Melatonin.  Qiara reports her mood is typically positive and she has minimal stress in her life.  He has goals to increase her strength and improve her motivation to get back to normal activities.  She will be followed by staff.    Expected Outcomes  Short:  Joyce Evans will exercise consistently ot increase her strength and get back to more normal activities.  Long:  Joyce Evans will continue to practice healthy lifestyle choices for her health and mental health.      Continue Psychosocial Services   Follow up required by staff       Psychosocial Re-Evaluation: Psychosocial Re-Evaluation    Joyce Evans Name 04/04/18 1651             Psychosocial Re-Evaluation   Current issues with  Current Stress Concerns       Comments  Joyce Evans has been getting out more - she went to the grocery store on her own and to a play.  She states she got a little teary eyed as she has been used to doing more, but knows she is feelong much stronger now.       Expected Outcomes  Short - continue to exercise and attend activities she enjoys to reduce stress Long - keep stress low long term          Psychosocial Discharge (Final Psychosocial Re-Evaluation): Psychosocial  Re-Evaluation - 04/04/18 1651      Psychosocial Re-Evaluation   Current issues with  Current Stress Concerns    Comments  Joyce Evans has been getting out more - she went to the grocery store on her own and to a play.  She states she got a little teary eyed as she has been used to doing more, but knows she is feelong much stronger now.    Expected Outcomes  Short - continue to exercise and attend activities she enjoys to reduce stress Long - keep stress low long term       Vocational Rehabilitation: Provide vocational rehab assistance to qualifying candidates.   Vocational Rehab Evaluation & Intervention: Vocational Rehab - 03/08/18 1515      Initial Vocational Rehab Evaluation & Intervention   Assessment shows need for Vocational Rehabilitation  No       Education: Education Goals: Education classes will be provided on a variety of topics geared toward better understanding of heart health and risk factor modification. Participant will state understanding/return demonstration of topics presented as noted by education test scores.  Learning Barriers/Preferences: Learning Barriers/Preferences - 03/08/18 1515      Learning Barriers/Preferences   Learning Barriers  None    Learning Preferences  Individual Instruction;Skilled Demonstration;Group Instruction       Education Topics:  AED/CPR: - Group verbal and written instruction with the use of models to demonstrate the basic use of the AED with the basic ABC's of resuscitation.   General Nutrition Guidelines/Fats and Fiber: -Group instruction provided by verbal, written material, models and posters to present the general guidelines for heart healthy nutrition. Gives an explanation and review of dietary fats and fiber.   Cardiac Rehab from 04/18/2018 in [facility] Cardiac and Pulmonary Rehab  Date  03/21/18  Educator  LB  Instruction Review Code  1- Verbalizes Understanding      Controlling Sodium/Reading Food Labels: -Group verbal and  written material supporting the discussion of sodium use in heart healthy nutrition. Review and explanation with models, verbal and written materials for utilization of the food label.   Cardiac Rehab from 04/18/2018 in [facility] Cardiac and Pulmonary Rehab  Date  03/30/18  Educator  LB  Instruction Review Code  1- Verbalizes Understanding      Exercise Physiology & General Exercise Guidelines: - Group verbal and written instruction with models to review the exercise physiology of the cardiovascular system and associated critical values. Provides general exercise guidelines with specific guidelines to those with heart or lung disease.    Cardiac Rehab from 04/18/2018 in [facility] Cardiac and Pulmonary Rehab  Date  04/06/18  Educator  Nada Maclachlan, EP  Instruction Review Code  1- Verbalizes Understanding      Aerobic Exercise & Resistance Training: - Gives group verbal and written instruction on the various components of exercise. Focuses on aerobic and resistive training programs and the benefits of this training and how to safely progress through these programs..   Flexibility, Balance, Mind/Body Relaxation: Provides group verbal/written instruction on the benefits of flexibility and balance training, including mind/body exercise modes such as yoga, pilates and tai chi.  Demonstration and skill practice provided.   Cardiac Rehab from 04/18/2018 in [facility] Cardiac and Pulmonary Rehab  Date  04/18/18  Educator  AS  Instruction Review Code  1- Verbalizes Understanding      Stress and Anxiety: - Provides group verbal and written instruction about the health risks of elevated stress and causes of high stress.  Discuss the correlation between heart/lung disease and anxiety and treatment options. Review healthy ways to manage with stress and anxiety.   Depression: - Provides group verbal and written instruction on the correlation between heart/lung disease and depressed mood, treatment options, and the  stigmas associated with seeking treatment.   Cardiac Rehab from 04/18/2018 in [facility] Cardiac and Pulmonary Rehab  Date  04/13/18  Educator  [facility]  Instruction Review Code  1- Verbalizes Understanding      Anatomy & Physiology of the Heart: - Group verbal and written instruction and models provide basic cardiac anatomy and physiology, with the coronary electrical and arterial systems. Review of Valvular disease and Heart Failure   Cardiac Procedures: - Group verbal and written instruction to review commonly prescribed medications for heart disease. Reviews the medication, class of the drug, and side effects. Includes the steps to properly store meds and maintain the prescription regimen. (beta blockers and nitrates)   Cardiac Medications I: - Group verbal and written instruction to review commonly prescribed medications for heart disease. Reviews the medication, class of the drug, and side effects. Includes the steps to properly store meds and maintain the prescription regimen.   Cardiac Medications II: -Group verbal and written instruction to review commonly prescribed medications for heart disease. Reviews the medication, class of the drug, and side effects. (all other drug classes)    Go Sex-Intimacy & Heart Disease, Get SMART - Goal Setting: - Group verbal and written instruction through game format to discuss heart disease and the return to sexual intimacy. Provides group verbal and written material to discuss and apply goal setting through the application of the S.M.A.R.T. Method.   Other Matters of the Heart: - Provides group verbal, written materials and models to describe Stable Angina and Peripheral  Artery. Includes description of the disease process and treatment options available to the cardiac patient.   Exercise & Equipment Safety: - Individual verbal instruction and demonstration of equipment use and safety with use of the equipment.   Cardiac Rehab from 04/18/2018 in [facility]  Cardiac and Pulmonary Rehab  Date  03/08/18  Educator  SB  Instruction Review Code  1- Verbalizes Understanding      Infection Prevention: - Provides verbal and written material to individual with discussion of infection control including proper hand washing and proper equipment cleaning during exercise session.   Cardiac Rehab from 04/18/2018 in [facility] Cardiac and Pulmonary Rehab  Date  03/08/18  Educator  SB  Instruction Review Code  1- Verbalizes Understanding      Falls Prevention: - Provides verbal and written material to individual with discussion of falls prevention and safety.   Cardiac Rehab from 04/18/2018 in [facility] Cardiac and Pulmonary Rehab  Date  03/08/18  Educator  SB  Instruction Review Code  1- Verbalizes Understanding      Diabetes: - Individual verbal and written instruction to review signs/symptoms of diabetes, desired ranges of glucose level fasting, after meals and with exercise. Acknowledge that pre and post exercise glucose checks will be done for 3 sessions at entry of program.   Know Your Numbers and Risk Factors: -Group verbal and written instruction about important numbers in your health.  Discussion of what are risk factors and how they play a role in the disease process.  Review of Cholesterol, Blood Pressure, Diabetes, and BMI and the role they play in your overall health.   Sleep Hygiene: -Provides group verbal and written instruction about how sleep can affect your health.  Define sleep hygiene, discuss sleep cycles and impact of sleep habits. Review good sleep hygiene tips.    Cardiac Rehab from 04/18/2018 in [facility] Cardiac and Pulmonary Rehab  Date  03/16/18  Educator  [facility]  Instruction Review Code  1- Verbalizes Understanding      Other: -Provides group and verbal instruction on various topics (see comments)   Knowledge Questionnaire Score: Knowledge Questionnaire Score - 03/08/18 1427      Knowledge Questionnaire Score   Pre Score  24/26    Test reviewed with pt today.  Education Focus: Angina and Exercise      Core Components/Risk Factors/Patient Goals at Admission: Personal Goals and Risk Factors at Admission - 03/08/18 1515      Core Components/Risk Factors/Patient Goals on Admission    Weight Management  Yes;Weight Loss    Admit Weight  179 lb (81.2 kg)    Goal Weight: Short Term  177 lb (80.3 kg)    Goal Weight: Long Term  160 lb (72.6 kg)    Expected Outcomes  Short Term: Continue to assess and modify interventions until short term weight is achieved;Long Term: Adherence to nutrition and physical activity/exercise program aimed toward attainment of established weight goal;Weight Loss: Understanding of general recommendations for a balanced deficit meal plan, which promotes 1-2 lb weight loss per week and includes a negative energy balance of [phone] kcal/d    Hypertension  Yes    Intervention  Provide education on lifestyle modifcations including regular physical activity/exercise, weight management, moderate sodium restriction and increased consumption of fresh fruit, vegetables, and low fat dairy, alcohol moderation, and smoking cessation.;Monitor prescription use compliance.    Expected Outcomes  Short Term: Continued assessment and intervention until BP is < 140/55m HG in hypertensive participants. < 130/833mHG in hypertensive  participants with diabetes, heart failure or chronic kidney disease.;Long Term: Maintenance of blood pressure at goal levels.       Core Components/Risk Factors/Patient Goals Review:  Goals and Risk Factor Review    Row Name 04/04/18 1642             Core Components/Risk Factors/Patient Goals Review   Personal Goals Review  Weight Management/Obesity;Lipids;Hypertension       Review  Joyce Evans has been following advice from RD to improve dietary habits.  [name].  She still has a lot of soreness from her incision.  She has  spoken with her Dr and will call the surgeon if it doesn't improve.  She is taking all meds as directed - her Dr increased carvedilol last week.       Expected Outcomes  Short - Continue to add resistance to machines, lose 1- 2 lb per week  Long - reach goal weight           Core Components/Risk Factors/Patient Goals at Discharge (Final Review):  Goals and Risk Factor Review - 04/04/18 1642      Core Components/Risk Factors/Patient Goals Review   Personal Goals Review  Weight Management/Obesity;Lipids;Hypertension    Review  Joyce Evans has been following advice from RD to improve dietary habits.  [name].  She still has a lot of soreness from her incision.  She has spoken with her Dr and will call the surgeon if it doesn't improve.  She is taking all meds as directed - her Dr increased carvedilol last week.    Expected Outcomes  Short - Continue to add resistance to machines, lose 1- 2 lb per week  Long - reach goal weight        ITP Comments: ITP Comments    Row Name 03/08/18 1507 03/23/18 0550 04/20/18 0655       ITP Comments  Medical evaluation completed today. ITP sent to Dr Loleta Chance for review,changes as needed and signature. Documentation of the diagnosis can be found in [facility] 01/31/2018  30 day review completed. ITP sent to Dr. Emily Filbert, Medical Director of Cardiac Rehab. Continue with ITP unless changes are made by physician  New to program  30 day review.  Continue with ITP unless directed changes per Medical Director review.        Comments:

## 2018-04-20 NOTE — Progress Notes (Signed)
Daily Session Note  Patient Details  Name: Joyce Evans MRN: [id number] Date of Birth: Jun 09, 1941 Referring Provider:     Cardiac Rehab from 03/08/2018 in [facility] Cardiac and Pulmonary Rehab  Referring Provider  Ward, Jeani Hawking MD      Encounter Date: 04/20/2018  Check In:      Social History   Tobacco Use  Smoking Status Never Smoker  Smokeless Tobacco Never Used    Goals Met:  Proper associated with RPD/PD & O2 Sat Independence with exercise equipment Exercise tolerated well No report of cardiac concerns or symptoms Strength training completed today  Goals Unmet:  Not Applicable  Comments:     Dr. Emily Filbert is Medical Director for [place] and [name].

## 2018-04-21 DIAGNOSIS — Z952 Presence of prosthetic heart valve: Secondary | ICD-10-CM | POA: Diagnosis not present

## 2018-04-21 DIAGNOSIS — Z954 Presence of other heart-valve replacement: Secondary | ICD-10-CM

## 2018-04-21 NOTE — Progress Notes (Signed)
Daily Session Note  Patient Details  Name: Joyce Evans MRN: [id number] Date of Birth: 06/18/1941 Referring Provider:     Cardiac Rehab from 03/08/2018 in [facility] Cardiac and Pulmonary Rehab  Referring Provider  Ward, Jeani Hawking MD      Encounter Date: 04/21/2018  Check In: Session Check In - 04/21/18 1627      Check-In   Supervising physician immediately available to respond to emergencies  See telemetry face sheet for immediately available ER MD    Location  [facility]-Cardiac & Pulmonary Rehab    Staff Present  Alberteen Sam, MA, RCEP, CCRP, Exercise Physiologist;Meredith Sherryll Burger, RN BSN;Susanne Bice, RN, BSN, CCRP;Vida Rigger RN, BSN    Medication changes reported      No    Fall or balance concerns reported     No    Warm-up and Cool-down  Performed on first and last piece of equipment    Resistance Training Performed  Yes    VAD Patient?  No    PAD/SET Patient?  No      Pain Assessment   Currently in Pain?  No/denies          Social History   Tobacco Use  Smoking Status Never Smoker  Smokeless Tobacco Never Used    Goals Met:  Independence with exercise equipment Exercise tolerated well No report of cardiac concerns or symptoms Strength training completed today  Goals Unmet:  Not Applicable  Comments: Pt able to follow exercise prescription today without complaint.  Will continue to monitor for progression.    Dr. Emily Filbert is Medical Director for [place] and [name].

## 2018-04-25 DIAGNOSIS — Z952 Presence of prosthetic heart valve: Secondary | ICD-10-CM | POA: Diagnosis not present

## 2018-04-25 DIAGNOSIS — Z954 Presence of other heart-valve replacement: Secondary | ICD-10-CM

## 2018-04-25 NOTE — Progress Notes (Signed)
Daily Session Note  Patient Details  Name: Joyce Evans MRN: [id number] Date of Birth: [birth year] Referring Provider:     Cardiac Rehab from 03/08/2018 in [facility] Cardiac and Pulmonary Rehab  Referring Provider  Ward, Jeani Hawking MD      Encounter Date: 04/25/2018  Check In: Session Check In - 04/25/18 1658      Check-In   Supervising physician immediately available to respond to emergencies  See telemetry face sheet for immediately available ER MD    Location  [facility]-Cardiac & Pulmonary Rehab    Staff Present  Nyoka Cowden, RN, BSN, Kela Millin, BA, ACSM CEP, Exercise Physiologist;Kelly Amedeo Plenty, BS, ACSM CEP, Exercise Physiologist    Medication changes reported      Yes    Comments  added albuterol prn, Singulair 17m, Prednisone 1422mand oxy hcl 22m50mrn     Fall or balance concerns reported     No    Warm-up and Cool-down  Performed on first and last piece of equipment    Resistance Training Performed  Yes    VAD Patient?  No    PAD/SET Patient?  No      Pain Assessment   Currently in Pain?  No/denies          Social History   Tobacco Use  Smoking Status Never Smoker  Smokeless Tobacco Never Used    Goals Met:  Independence with exercise equipment Exercise tolerated well No report of cardiac concerns or symptoms Strength training completed today  Goals Unmet:  Not Applicable  Comments: Pt able to follow exercise prescription today without complaint.  Will continue to monitor for progression.    Dr. MarEmily Filbert Medical Director for [name].

## 2018-04-27 ENCOUNTER — Encounter: Payer: Medicare Other | Admitting: *Deleted

## 2018-04-27 DIAGNOSIS — Z952 Presence of prosthetic heart valve: Secondary | ICD-10-CM | POA: Diagnosis not present

## 2018-04-27 DIAGNOSIS — Z954 Presence of other heart-valve replacement: Secondary | ICD-10-CM

## 2018-04-27 NOTE — Progress Notes (Signed)
Daily Session Note  Patient Details  Name: Joyce Evans MRN: [id number] Date of Birth: 08-14-1940 Referring Provider:     Cardiac Rehab from 03/08/2018 in [facility] Cardiac and Pulmonary Rehab  Referring Provider  Ward, Jeani Hawking MD      Encounter Date: 04/27/2018  Check In:      Social History   Tobacco Use  Smoking Status Never Smoker  Smokeless Tobacco Never Used    Goals Met:  Proper associated with RPD/PD & O2 Sat Exercise tolerated well No report of cardiac concerns or symptoms Strength training completed today  Goals Unmet:  Not Applicable  Comments:     Dr. Emily Filbert is Medical Director for [place] and [name].

## 2018-04-28 DIAGNOSIS — Z952 Presence of prosthetic heart valve: Secondary | ICD-10-CM | POA: Diagnosis not present

## 2018-04-28 DIAGNOSIS — Z954 Presence of other heart-valve replacement: Secondary | ICD-10-CM

## 2018-04-28 NOTE — Progress Notes (Signed)
Daily Session Note  Patient Details  Name: Joyce Evans MRN: [id number] Date of Birth: 1941/05/16 Referring Provider:     Cardiac Rehab from 03/08/2018 in [facility] Cardiac and Pulmonary Rehab  Referring Provider  Ward, Jeani Hawking MD      Encounter Date: 04/28/2018  Check In:      Social History   Tobacco Use  Smoking Status Never Smoker  Smokeless Tobacco Never Used    Goals Met:  Independence with exercise equipment Exercise tolerated well No report of cardiac concerns or symptoms  Goals Unmet:  Not Applicable  Comments: Pt able to follow exercise prescription today without complaint.  Will continue to monitor for progression.    Dr. Emily Filbert is Medical Director for [place] and [name].

## 2018-05-02 ENCOUNTER — Telehealth: Payer: Self-pay | Admitting: *Deleted

## 2018-05-02 NOTE — Telephone Encounter (Signed)
Joyce Evans called staff to share that she has a very bad cold and is continuing to cough. She will not be in class this afternoon. She hopes to return Wednesday.

## 2018-05-09 DIAGNOSIS — Z952 Presence of prosthetic heart valve: Secondary | ICD-10-CM | POA: Diagnosis not present

## 2018-05-09 DIAGNOSIS — Z954 Presence of other heart-valve replacement: Secondary | ICD-10-CM

## 2018-05-09 NOTE — Progress Notes (Signed)
Daily Session Note  Patient Details  Name: Joyce Evans MRN: [id number] Date of Birth: 03/13/1941 Referring Provider:     Cardiac Rehab from 03/08/2018 in [facility] Cardiac and Pulmonary Rehab  Referring Provider  Ward, Jeani Hawking MD      Encounter Date: 05/09/2018  Check In: Session Check In - 05/09/18 1632      Check-In   Supervising physician immediately available to respond to emergencies  See telemetry face sheet for immediately available ER MD    Location  [facility]-Cardiac & Pulmonary Rehab    Staff Present  Nada Maclachlan, BA, ACSM CEP, Exercise Physiologist;Carroll Enterkin, RN, Moises Blood, BS, ACSM CEP, Exercise Physiologist          Social History   Tobacco Use  Smoking Status Never Smoker  Smokeless Tobacco Never Used    Goals Met:  Independence with exercise equipment Changing diet to healthy choices, watching portion sizes Exercise tolerated well Personal goals reviewed  Goals Unmet:  Not Applicable  Comments: Pt able to follow exercise prescription today without complaint.  Will continue to monitor for progression.    Dr. Emily Filbert is Medical Director for [place] and [name].

## 2018-05-16 ENCOUNTER — Encounter: Payer: Medicare Other | Attending: Internal Medicine

## 2018-05-16 DIAGNOSIS — Z954 Presence of other heart-valve replacement: Secondary | ICD-10-CM

## 2018-05-16 DIAGNOSIS — Z952 Presence of prosthetic heart valve: Secondary | ICD-10-CM | POA: Insufficient documentation

## 2018-05-16 NOTE — Progress Notes (Signed)
Daily Session Note  Patient Details  Name: Joyce Evans MRN: [id number] Date of Birth: Jun 06, 1941 Referring Provider:     Cardiac Rehab from 03/08/2018 in [facility] Cardiac and Pulmonary Rehab  Referring Provider  Ward, Jeani Hawking MD      Encounter Date: 05/16/2018  Check In: Session Check In - 05/16/18 1728      Check-In   Supervising physician immediately available to respond to emergencies  See telemetry face sheet for immediately available ER MD    Location  [facility]-Cardiac & Pulmonary Rehab    Staff Present  Nyoka Cowden, RN, BSN, MA;Susanne Bice, RN, BSN, Sims, BS, ACSM CEP, Exercise Physiologist;Other    Medication changes reported      No    Fall or balance concerns reported     No    Tobacco Cessation  No Change    Warm-up and Cool-down  Performed on first and last piece of equipment    Resistance Training Performed  Yes    VAD Patient?  No    PAD/SET Patient?  No      Pain Assessment   Currently in Pain?  No/denies    Multiple Pain Sites  No          Social History   Tobacco Use  Smoking Status Never Smoker  Smokeless Tobacco Never Used    Goals Met:  Independence with exercise equipment Exercise tolerated well No report of cardiac concerns or symptoms Strength training completed today  Goals Unmet:  Not Applicable  Comments: Pt able to follow exercise prescription today without complaint.  Will continue to monitor for progression.   Dr. Emily Filbert is Medical Director for [place] and [name].

## 2018-05-18 ENCOUNTER — Encounter: Payer: Self-pay | Admitting: *Deleted

## 2018-05-18 ENCOUNTER — Encounter: Payer: Medicare Other | Admitting: *Deleted

## 2018-05-18 VITALS — Ht 63.2 in | Wt 178.1 lb

## 2018-05-18 DIAGNOSIS — Z952 Presence of prosthetic heart valve: Secondary | ICD-10-CM | POA: Diagnosis not present

## 2018-05-18 DIAGNOSIS — Z954 Presence of other heart-valve replacement: Secondary | ICD-10-CM

## 2018-05-18 NOTE — Progress Notes (Signed)
Daily Session Note  Patient Details  Name: Joyce Evans MRN: [id number] Date of Birth: 10/25/1940 Referring Provider:     Cardiac Rehab from 03/08/2018 in [facility] Cardiac and Pulmonary Rehab  Referring Provider  Ward, Cary MD      Encounter Date: 05/18/2018  Check In: Session Check In - 05/18/18 1648      Check-In   Supervising physician immediately available to respond to emergencies  See telemetry face sheet for immediately available MD    Location  [facility]-Cardiac & Pulmonary Rehab    Staff Present  Meredith Craven, RN BSN;Amanda Sommer, BA, ACSM CEP, Exercise Physiologist;Carroll Enterkin, RN, BSN    Medication changes reported      No    Fall or balance concerns reported     No    Tobacco Cessation  No Change    Warm-up and Cool-down  Performed on first and last piece of equipment    Resistance Training Performed  Yes    VAD Patient?  No    PAD/SET Patient?  No      Pain Assessment   Currently in Pain?  No/denies          Social History   Tobacco Use  Smoking Status Never Smoker  Smokeless Tobacco Never Used    Goals Met:  Proper associated with RPD/PD & O2 Sat Independence with exercise equipment Exercise tolerated well No report of cardiac concerns or symptoms Strength training completed today  Goals Unmet:  Not Applicable  Comments:     Dr. Mark Miller is Medical Director for [facility] and [name]. 

## 2018-05-18 NOTE — Progress Notes (Signed)
Cardiac Individual Treatment Plan  Patient Details  Name: Joyce Evans MRN: [id number] Date of Birth: 1941/07/03 Referring Provider:     Cardiac Rehab from 03/08/2018 in [facility] Cardiac and Pulmonary Rehab  Referring Provider  Ward, Jeani Hawking MD      Initial Encounter Date:    Cardiac Rehab from 03/08/2018 in [facility] Cardiac and Pulmonary Rehab  Date  03/08/18      Visit Diagnosis: S/P tricuspid valve replacement  Patient's Home Medications on Admission:  Current Outpatient Medications:  .  acetaminophen (TYLENOL) 500 MG tablet, Take 500 mg by mouth every 6 (six) hours as needed., Disp: , Rfl:  .  ALPRAZolam (XANAX) 0.25 MG tablet, Take 0.125 mg by mouth at bedtime., Disp: , Rfl:  .  apixaban (ELIQUIS) 5 MG TABS tablet, TAKE 1 TABLET BY MOUTH TWO  TIMES DAILY, Disp: , Rfl:  .  brimonidine (ALPHAGAN P) 0.1 % SOLN, Place 1 drop into the right eye 2 (two) times daily., Disp: , Rfl:  .  Brinzolamide-Brimonidine (SIMBRINZA) 1-0.2 % SUSP, Place 1 drop into the left eye 2 (two) times daily., Disp: , Rfl:  .  Calcium-Phosphorus-Vitamin D (CITRACAL +D3 PO), Take by mouth daily., Disp: , Rfl:  .  Cholecalciferol (VITAMIN D3) 3000 units TABS, Take by mouth 2 (two) times daily., Disp: , Rfl:  .  famotidine-calcium carbonate-magnesium hydroxide (PEPCID COMPLETE) 10-800-165 MG chewable tablet, Chew 1 tablet by mouth daily as needed., Disp: , Rfl:  .  furosemide (LASIX) 20 MG tablet, Take by mouth., Disp: , Rfl:  .  multivitamin-iron-minerals-folic acid (CENTRUM) chewable tablet, Chew 1 tablet by mouth daily., Disp: , Rfl:  .  omeprazole (PRILOSEC) 20 MG capsule, Take 20 mg by mouth 2 (two) times daily. Reported on 08/28/2015, Disp: , Rfl:  .  potassium chloride (K-DUR) 10 MEQ tablet, Take by mouth., Disp: , Rfl:  .  Probiotic Product (PROBIOTIC DAILY PO), Take by mouth daily., Disp: , Rfl:  .  timolol (BETIMOL) 0.5 % ophthalmic solution, Place 1 drop into both eyes daily., Disp: , Rfl:   Past Medical  History: Past Medical History:  Diagnosis Date  . Arthritis    osteo - shoulders, fingers  . Atrial fibrillation ([place])   . Dizziness    thinks because of diuretic  . Family history of adverse reaction to anesthesia    daughter -PONV  . Gallstones 2016, 2017  . GERD (gastroesophageal reflux disease)   . Glaucoma   . Heart murmur    history of  . Hypertension   . Numbness in left leg    s/p hematoma  . Sciatica    right  . Seasonal allergies   . Skin cancer   . Tricuspid valve disorder    leak    Tobacco Use: Social History   Tobacco Use  Smoking Status Never Smoker  Smokeless Tobacco Never Used    Labs: Recent Review Flowsheet Data    There is no flowsheet data to display.       Exercise Target Goals: Exercise Program Goal: Individual exercise prescription set using results from initial 6 min walk test and THRR while considering  patient's activity barriers and safety.   Exercise Prescription Goal: Initial exercise prescription builds to 30-45 minutes a day of aerobic activity, 2-3 days per week.  Home exercise guidelines will be given to patient during program as part of exercise prescription that the participant will acknowledge.  Activity Barriers & Risk Stratification: Activity Barriers & Cardiac Risk Stratification - 03/08/18  1452      Activity Barriers & Cardiac Risk Stratification   Activity Barriers  Deconditioning;Muscular Weakness;Shortness of Breath;History of Falls;Balance Concerns;Joint Problems   R arm sore since surgery, L shoulder very little cartilidge, limited OM   Cardiac Risk Stratification  Moderate       6 Minute Walk: 6 Minute Walk    Row Name 03/08/18 1451         6 Minute Walk   Phase  Initial     Distance  1265 feet     Walk Time  6 minutes     # of Rest Breaks  0     MPH  2.4     METS  2.54     RPE  13     Perceived Dyspnea   1     VO2 Peak  8.89     Symptoms  Yes (comment)     Comments  SOB, twinge in low back      Resting HR  64 bpm     Resting BP  128/74     Resting Oxygen Saturation   99 %     Exercise Oxygen Saturation  during 6 min walk  94 %     Max Ex. HR  112 bpm     Max Ex. BP  154/84     2 Minute Post BP  146/64        Oxygen Initial Assessment:   Oxygen Re-Evaluation:   Oxygen Discharge (Final Oxygen Re-Evaluation):   Initial Exercise Prescription: Initial Exercise Prescription - 03/08/18 1400      Date of Initial Exercise RX and Referring Provider   Date  03/08/18    Referring Provider  Ward, Jeani Hawking MD      Treadmill   MPH  2    Grade  0    Minutes  15    METs  2.53      NuStep   Level  2    SPM  80    Minutes  15    METs  2.5      Recumbant Elliptical   Level  1    RPM  50    Minutes  15    METs  2.5      Prescription Details   Frequency (times per week)  3    Duration  Progress to 45 minutes of aerobic exercise without signs/symptoms of physical distress      Intensity   THRR 40-80% of Max Heartrate  96-129    Ratings of Perceived Exertion  11-13    Perceived Dyspnea  0-4      Progression   Progression  Continue to progress workloads to maintain intensity without signs/symptoms of physical distress.      Resistance Training   Training Prescription  Yes    Weight  3 lbs    Reps  10-15       Perform Capillary Blood Glucose checks as needed.  Exercise Prescription Changes: Exercise Prescription Changes    Row Name 03/08/18 1400 03/17/18 0800 03/30/18 1200 04/13/18 1700 04/14/18 0800     Response to Exercise   Blood Pressure (Admit)  128/74  124/68  116/60  -  124/66   Blood Pressure (Exercise)  154/84  138/70  160/80  -  118/68   Blood Pressure (Exit)  146/64  120/62  130/70  -  104/56   Heart Rate (Admit)  64 bpm  66 bpm  70 bpm  -  101 bpm   Heart Rate (Exercise)  112 bpm  106 bpm  118 bpm  -  98 bpm   Heart Rate (Exit)  81 bpm  81 bpm  83 bpm  -  70 bpm   Oxygen Saturation (Admit)  99 %  -  -  -  -   Oxygen Saturation (Exercise)  94 %  -   -  -  -   Rating of Perceived Exertion (Exercise)  _0 -  -   Perceived Dyspnea (Exercise)  1  -  -  -  -   Symptoms  SOB, low back twinge  -  -  -  none   Comments  walk test results  1st day  -  -  -   Duration  -  Progress to 45 minutes of aerobic exercise without signs/symptoms of physical distress  Progress to 45 minutes of aerobic exercise without signs/symptoms of physical distress  -  Progress to 45 minutes of aerobic exercise without signs/symptoms of physical distress   Intensity  -  THRR unchanged  THRR unchanged  -  THRR unchanged     Progression   Progression  -  Continue to progress workloads to maintain intensity without signs/symptoms of physical distress.  Continue to progress workloads to maintain intensity without signs/symptoms of physical distress.  -  Continue to progress workloads to maintain intensity without signs/symptoms of physical distress.   Average METs  -  2.3  2.2  -  -     Resistance Training   Training Prescription  -  Yes  Yes  -  Yes   Weight  -  3 lb  3 lb  -  3 lb   Reps  -  10-15  10-15  -  10-15     Interval Training   Interval Training  -  -  No  -  -     Treadmill   MPH  -  2  2  -  2   Grade  -  0  0  -  0.5   Minutes  -  15  15  -  15   METs  -  2.53  2.53  -  2.67     NuStep   Level  -  2  2  -  3   SPM  -  80  80  -  80   Minutes  -  15  15  -  15   METs  -  2.1  1.8  -  1.8     Home Exercise Plan   Plans to continue exercise at  -  -  -  [employer] (comment) [employer] [facility] [employer] [facility] [employer] [facility]   Frequency  -  -  -  Add 2 additional days to program exercise sessions.  Add 2 additional days to program exercise sessions.   Initial Home Exercises Provided  -  -  -  04/13/18  04/13/18   Row Name 05/10/18 1500             Response to Exercise   Blood Pressure (Admit)  146/88       Blood Pressure (Exercise)  138/64       Blood Pressure (Exit)  130/70       Heart Rate (Admit)  80 bpm       Heart  Rate (Exercise)  118 bpm  Heart Rate (Exit)  95 bpm       Rating of Perceived Exertion (Exercise)  12       Symptoms  none       Duration  Progress to 45 minutes of aerobic exercise without signs/symptoms of physical distress       Intensity  THRR unchanged         Progression   Progression  Continue to progress workloads to maintain intensity without signs/symptoms of physical distress.       Average METs  3.45         Resistance Training   Training Prescription  Yes       Weight  3 lb       Reps  10-15         Interval Training   Interval Training  No         Treadmill   MPH  2       Grade  2       Minutes  15       METs  3.08         NuStep   Level  6       SPM  80       Minutes  15       METs  3.9          Exercise Comments: Exercise Comments    Row Name 03/16/18 1642 04/13/18 1749         Exercise Comments  First full day of exercise!  Patient was oriented to gym and equipment including functions, settings, policies, and procedures.  Patient's individual exercise prescription and treatment plan were reviewed.  All starting workloads were established based on the results of the 6 minute walk test done at initial orientation visit.  The plan for exercise progression was also introduced and progression will be customized based on patient's performance and goals.  Reviewed home exercise with pt today.  Pt plans to go to [facility] and [facility] for exercise.  Reviewed THR, pulse, RPE, sign and symptoms, NTG use, and when to call 911 or MD.  Also discussed weather considerations and indoor options.  Pt voiced understanding.         Exercise Goals and Review: Exercise Goals    Row Name 03/08/18 1457             Exercise Goals   Increase Physical Activity  Yes       Intervention  Provide advice, education, support and counseling about physical activity/exercise needs.;Develop an individualized exercise prescription for aerobic and resistive training based on initial  evaluation findings, risk stratification, comorbidities and participant's personal goals.       Expected Outcomes  Short Term: Attend rehab on a regular basis to increase amount of physical activity.;Long Term: Add in home exercise to make exercise part of routine and to increase amount of physical activity.;Long Term: Exercising regularly at least 3-5 days a week.       Increase Strength and Stamina  Yes       Intervention  Provide advice, education, support and counseling about physical activity/exercise needs.;Develop an individualized exercise prescription for aerobic and resistive training based on initial evaluation findings, risk stratification, comorbidities and participant's personal goals.       Expected Outcomes  Short Term: Increase workloads from initial exercise prescription for resistance, speed, and METs.;Short Term: Perform resistance training exercises routinely during rehab and add in resistance training at home;Long Term: Improve cardiorespiratory fitness,  muscular endurance and strength as measured by increased METs and functional capacity (6MWT)       Able to understand and use rate of perceived exertion (RPE) scale  Yes       Intervention  Provide education and explanation on how to use RPE scale       Expected Outcomes  Short Term: Able to use RPE daily in rehab to express subjective intensity level;Long Term:  Able to use RPE to guide intensity level when exercising independently       Able to understand and use Dyspnea scale  Yes       Intervention  Provide education and explanation on how to use Dyspnea scale       Expected Outcomes  Short Term: Able to use Dyspnea scale daily in rehab to express subjective sense of shortness of breath during exertion;Long Term: Able to use Dyspnea scale to guide intensity level when exercising independently       Knowledge and understanding of Target Heart Rate Range (THRR)  Yes       Intervention  Provide education and explanation of THRR  including how the numbers were predicted and where they are located for reference       Expected Outcomes  Short Term: Able to use daily as guideline for intensity in rehab;Short Term: Able to state/look up THRR;Long Term: Able to use THRR to govern intensity when exercising independently       Able to check pulse independently  Yes       Intervention  Provide education and demonstration on how to check pulse in carotid and radial arteries.;Review the importance of being able to check your own pulse for safety during independent exercise       Expected Outcomes  Short Term: Able to explain why pulse checking is important during independent exercise;Long Term: Able to check pulse independently and accurately       Understanding of Exercise Prescription  Yes       Intervention  Provide education, explanation, and written materials on patient's individual exercise prescription       Expected Outcomes  Short Term: Able to explain program exercise prescription;Long Term: Able to explain home exercise prescription to exercise independently          Exercise Goals Re-Evaluation : Exercise Goals Re-Evaluation    Row Name 03/16/18 1642 03/30/18 1249 04/14/18 0813 05/10/18 1538       Exercise Goal Re-Evaluation   Exercise Goals Review  Increase Physical Activity;Able to understand and use rate of perceived exertion (RPE) scale;Knowledge and understanding of Target Heart Rate Range (THRR);Understanding of Exercise Prescription;Increase Strength and Stamina  Increase Physical Activity;Increase Strength and Stamina;Able to understand and use rate of perceived exertion (RPE) scale  Increase Physical Activity;Increase Strength and Stamina;Able to understand and use rate of perceived exertion (RPE) scale;Knowledge and understanding of Target Heart Rate Range (THRR);Able to check pulse independently  Increase Physical Activity;Increase Strength and Stamina;Able to understand and use rate of perceived exertion (RPE)  scale;Able to understand and use Dyspnea scale;Knowledge and understanding of Target Heart Rate Range (THRR);Understanding of Exercise Prescription;Able to check pulse independently    Comments  Reviewed RPE scale, THR and program prescription with pt today.  Pt voiced understanding and was given a copy of goals to take home.   Alezandra has just started HT this month.  She will see more improvement if she attends consistently.  She has maintained MET level so far.  Capucine is a member at  the [employer].  She plans to [facility] [employer] [facility].    Carmeline missed last week due to bronchitis.  She is missing some this week due to travel.  She states her overall stamina is much better and she plans to continue in [facility].    Expected Outcomes  Short: Use RPE daily to regulate intensity. Long: Follow program prescription in THR.  SHort - attend 3 days per week Long - increase MET level  Short - exercise on her own  1-2 days outside HT Long - exercise independently 3-5 times per week  Short - complete HT class  Long - maintain exercise in [facility]       Discharge Exercise Prescription (Final Exercise Prescription Changes): Exercise Prescription Changes - 05/10/18 1500      Response to Exercise   Blood Pressure (Admit)  146/88    Blood Pressure (Exercise)  138/64    Blood Pressure (Exit)  130/70    Heart Rate (Admit)  80 bpm    Heart Rate (Exercise)  118 bpm    Heart Rate (Exit)  95 bpm    Rating of Perceived Exertion (Exercise)  12    Symptoms  none    Duration  Progress to 45 minutes of aerobic exercise without signs/symptoms of physical distress    Intensity  THRR unchanged      Progression   Progression  Continue to progress workloads to maintain intensity without signs/symptoms of physical distress.    Average METs  3.45      Resistance Training   Training Prescription  Yes    Weight  3 lb    Reps  10-15      Interval Training   Interval Training  No      Treadmill   MPH  2    Grade   2    Minutes  15    METs  3.08      NuStep   Level  6    SPM  80    Minutes  15    METs  3.9       Nutrition:  Target Goals: Understanding of nutrition guidelines, daily intake of sodium <1554m, cholesterol <205m calories 30% from fat and 7% or less from saturated fats, daily to have 5 or more servings of fruits and vegetables.  Biometrics: Pre Biometrics - 03/08/18 1458      Pre Biometrics   Height  5' 3.2" (1.605 m)    Weight  179 lb 8 oz (81.4 kg)    Waist Circumference  34 inches    Hip Circumference  43 inches    Waist to Hip Ratio  0.79 %    BMI (Calculated)  31.61    Single Leg Stand  1.81 seconds        Nutrition Therapy Plan and Nutrition Goals: Nutrition Therapy & Goals - 03/30/18 1631      Nutrition Therapy   Diet  DASH/ TLC    Protein (specify units)  6oz    Fiber  30 grams    Whole Grain Foods  3 servings   chooses whole grains   Saturated Fats  12 max. grams    Fruits and Vegetables  6 servings/day   8 ideal   Sodium  1500 grams      Personal Nutrition Goals   Nutrition Goal  Be more mindful of the sodium content in foods by starting to read nutrition facts labels and  make more informed choices when eating out. When eating at [facility], you can look up the nutrition information ahead of time and plan what low-sodium options you will choose    Personal Goal #2  As you continue to recover and regain more energy, work to eat at home more often to cut back on hidden sources of sodium, fat and sugar     Personal Goal #3  Continue to choose whole grain foods and to consume sources of healthy fat, such as your avocado oil and nuts- great job!    Comments  She has been cooking at home less often since her surgery at the end of July. Her husband is a diabetic and so the two of them work together to have a lower sugar diet. They struggle to monitor sodium intake but do enjoy a variety of fruits, vegetables, protein sources and grains. She enjoys yogurt as well.       Intervention Plan   Intervention  Prescribe, educate and counsel regarding individualized specific dietary modifications aiming towards targeted core components such as weight, hypertension, lipid management, diabetes, heart failure and other comorbidities.;Nutrition handout(s) given to patient.   sodium, food labels, and dining out packet   Expected Outcomes  Short Term Goal: Understand basic principles of dietary content, such as calories, fat, sodium, cholesterol and nutrients.;Short Term Goal: A plan has been developed with personal nutrition goals set during dietitian appointment.;Long Term Goal: Adherence to prescribed nutrition plan.       Nutrition Assessments: Nutrition Assessments - 03/08/18 1428      MEDFICTS Scores   Pre Score  40       Nutrition Goals Re-Evaluation: Nutrition Goals Re-Evaluation    Starr Name 03/30/18 1649 04/18/18 1658           Goals   Nutrition Goal  Be more mindful of the sodium content in foods by starting to read nutrition facts labels and make more informed choices when eating out. When eating at [facility], you can look up the nutrition information ahead of time and plan what low-sodium options you will choose  Be more mindful of the sodium content in foods and start to read food labels more often; continue to choose whole grains and sources of heart healthy fats; start to eat at home more often as you start to regain energy levels      Comment  She c/o struggling to monitor sodium in her diet and needs education on how to reduce her overall salt intake  She has started to read food labels, often aiming for food choices with sodium content in the double-digits per serving and bringing snacks on trips rather than stopping at fast food locations. She purchased low salt swiss cheese and Triscuit crackers, as well as fruits for snacks. She purchased [employer] and Stevia natural zero calorie sweeteners to use in items like tea and oatmeal. She and her husband are  working to eat more fruits and vegetables and lower sugar intake as her husband is a diabetic. Reports her wt to be up slightly this week d/t d/c of lasix      Expected Outcome  She will read food labels consistently to identify high and low sodium items, and cook at home more often to better monitor her sodium intake  She will continue to monitor sodium intake more closely, aiming for foods with 122m of sodium per serving or less. Continue to increase fruit and vegetable and decrease added sugar intake. She will work  on eating at home more in the near future and be mindful of high-sodium choices when eating out in the mean time        Personal Goal #2 Re-Evaluation   Personal Goal #2  Continue to choose whole grain foods and to consume sources of healthy fat, such as your avocado oil and nuts- great job!  -        Personal Goal #3 Re-Evaluation   Personal Goal #3  As your continue to recover and regain more energy, work to eat at home more often to cut back on hidden sources of sodium, fat and sugar  -         Nutrition Goals Discharge (Final Nutrition Goals Re-Evaluation): Nutrition Goals Re-Evaluation - 04/18/18 1658      Goals   Nutrition Goal  Be more mindful of the sodium content in foods and start to read food labels more often; continue to choose whole grains and sources of heart healthy fats; start to eat at home more often as you start to regain energy levels    Comment  She has started to read food labels, often aiming for food choices with sodium content in the double-digits per serving and bringing snacks on trips rather than stopping at fast food locations. She purchased low salt swiss cheese and Triscuit crackers, as well as fruits for snacks. She purchased [employer] and Stevia natural zero calorie sweeteners to use in items like tea and oatmeal. She and her husband are working to eat more fruits and vegetables and lower sugar intake as her husband is a diabetic. Reports her wt to be  up slightly this week d/t d/c of lasix    Expected Outcome  She will continue to monitor sodium intake more closely, aiming for foods with 181m of sodium per serving or less. Continue to increase fruit and vegetable and decrease added sugar intake. She will work on eating at home more in the near future and be mindful of high-sodium choices when eating out in the mean time       Psychosocial: Target Goals: Acknowledge presence or absence of significant depression and/or stress, maximize coping skills, provide positive support system. Participant is able to verbalize types and ability to use techniques and skills needed for reducing stress and depression.   Initial Review & Psychosocial Screening: Initial Psych Review & Screening - 03/08/18 1513      Initial Review   Current issues with  Current Stress Concerns    Source of Stress Concerns  Unable to perform yard/household activities    Comments  Wants to get back to her outside yard/garden.       Family Dynamics   Good Support System?  Yes   Spouse, 2 daughters, friends, 2 sisters     Barriers   Psychosocial barriers to participate in program  There are no identifiable barriers or psychosocial needs.      Screening Interventions   Interventions  Encouraged to exercise    Expected Outcomes  Short Term goal: Utilizing psychosocial counselor, staff and physician to assist with identification of specific Stressors or current issues interfering with healing process. Setting desired goal for each stressor or current issue identified.;Long Term Goal: Stressors or current issues are controlled or eliminated.;Short Term goal: Identification and review with participant of any Quality of Life or Depression concerns found by scoring the questionnaire.;Long Term goal: The participant improves quality of Life and PHQ9 Scores as seen by post scores and/or verbalization of changes  Quality of Life Scores:  Quality of Life - 03/08/18 1428       Quality of Life   Select  Quality of Life      Quality of Life Scores   Health/Function Pre  26.8 %    Socioeconomic Pre  28.75 %    Psych/Spiritual Pre  29.14 %    Family Pre  28.8 %    GLOBAL Pre  27.95 %      Scores of 19 and below usually indicate a poorer quality of life in these areas.  A difference of  2-3 points is a clinically meaningful difference.  A difference of 2-3 points in the total score of the Quality of Life Index has been associated with significant improvement in overall quality of life, self-image, physical symptoms, and general health in studies assessing change in quality of life.  PHQ-9: Recent Review Flowsheet Data    Depression screen [facility] 2/9 03/08/2018   Decreased Interest 1   Down, Depressed, Hopeless 0   PHQ - 2 Score 1   Altered sleeping 0   Tired, decreased energy 1   Change in appetite 0   Feeling bad or failure about yourself  0   Trouble concentrating 0   Moving slowly or fidgety/restless 0   Suicidal thoughts 0   PHQ-9 Score 2   Difficult doing work/chores Not difficult at all     Interpretation of Total Score  Total Score Depression Severity:  1-4 = Minimal depression, 5-9 = Mild depression, 10-14 = Moderate depression, 15-19 = Moderately severe depression, 20-27 = Severe depression   Psychosocial Evaluation and Intervention: Psychosocial Evaluation - 03/21/18 1702      Psychosocial Evaluation & Interventions   Interventions  Encouraged to exercise with the program and follow exercise prescription    Comments  Counselor met with Ms. Verdene Rio Bethena Roys) today for initial psychosocial evaluation.  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  Alyah has a strong support system with a spouse of 38 years; (2) adult daughters; (3) sisters locally and active involvement in her local church.  She has several other health conditions with osteoporosis; glaucoma; and GERD - in addition to her cardiac condition.   She reports sleeping well and having a good appetite.  Devetta denies a history of depression or anxiety or any current symptoms; but reports she is on a low dose medication PRN that she takes when she can't sleep along with Melatonin.  Zaria reports her mood is typically positive and she has minimal stress in her life.  He has goals to increase her strength and improve her motivation to get back to normal activities.  She will be followed by staff.    Expected Outcomes  Short:  Treniece will exercise consistently ot increase her strength and get back to more normal activities.  Long:  Myonna will continue to practice healthy lifestyle choices for her health and mental health.      Continue Psychosocial Services   Follow up required by staff       Psychosocial Re-Evaluation: Psychosocial Re-Evaluation    [place] Name 04/04/18 1651 05/09/18 1639           Psychosocial Re-Evaluation   Current issues with  Current Stress Concerns  Current Stress Concerns      Comments  Valley has been getting out more - she went to the grocery store on her own and to a play.  She  states she got a little teary eyed as she has been used to doing more, but knows she is feelong much stronger now.  Ashlan is going out a lot now - to [place] to see grandkids sports events.  She states she is much better emotionally as well.      Expected Outcomes  Short - continue to exercise and attend activities she enjoys to reduce stress Long - keep stress low long term  Short - continue to exercise Long - maintain exercise on her own         Psychosocial Discharge (Final Psychosocial Re-Evaluation): Psychosocial Re-Evaluation - 05/09/18 1639      Psychosocial Re-Evaluation   Current issues with  Current Stress Concerns    Comments  Theresia is going out a lot now - to [place] to see grandkids sports events.  She states she is much better emotionally as well.    Expected Outcomes  Short - continue to exercise Long - maintain exercise on her own        Vocational Rehabilitation: Provide vocational rehab assistance to qualifying candidates.   Vocational Rehab Evaluation & Intervention: Vocational Rehab - 03/08/18 1515      Initial Vocational Rehab Evaluation & Intervention   Assessment shows need for Vocational Rehabilitation  No       Education: Education Goals: Education classes will be provided on a variety of topics geared toward better understanding of heart health and risk factor modification. Participant will state understanding/return demonstration of topics presented as noted by education test scores.  Learning Barriers/Preferences: Learning Barriers/Preferences - 03/08/18 1515      Learning Barriers/Preferences   Learning Barriers  None    Learning Preferences  Individual Instruction;Skilled Demonstration;Group Instruction       Education Topics:  AED/CPR: - Group verbal and written instruction with the use of models to demonstrate the basic use of the AED with the basic ABC's of resuscitation.   General Nutrition Guidelines/Fats and Fiber: -Group instruction provided by verbal, written material, models and posters to present the general guidelines for heart healthy nutrition. Gives an explanation and review of dietary fats and fiber.   Cardiac Rehab from 05/16/2018 in [facility] Cardiac and Pulmonary Rehab  Date  05/16/18  Educator  LB  Instruction Review Code  1- Verbalizes Understanding      Controlling Sodium/Reading Food Labels: -Group verbal and written material supporting the discussion of sodium use in heart healthy nutrition. Review and explanation with models, verbal and written materials for utilization of the food label.   Cardiac Rehab from 05/16/2018 in [facility] Cardiac and Pulmonary Rehab  Date  03/30/18  Educator  LB  Instruction Review Code  1- Verbalizes Understanding      Exercise Physiology & General Exercise Guidelines: - Group verbal and written instruction with models to review the  exercise physiology of the cardiovascular system and associated critical values. Provides general exercise guidelines with specific guidelines to those with heart or lung disease.    Cardiac Rehab from 05/16/2018 in [facility] Cardiac and Pulmonary Rehab  Date  04/06/18  Educator  Nada Maclachlan, EP  Instruction Review Code  1- Verbalizes Understanding      Aerobic Exercise & Resistance Training: - Gives group verbal and written instruction on the various components of exercise. Focuses on aerobic and resistive training programs and the benefits of this training and how to safely progress through these programs..   Flexibility, Balance, Mind/Body Relaxation: Provides group verbal/written instruction on the benefits of flexibility and  balance training, including mind/body exercise modes such as yoga, pilates and tai chi.  Demonstration and skill practice provided.   Cardiac Rehab from 05/16/2018 in [facility] Cardiac and Pulmonary Rehab  Date  04/18/18  Educator  AS  Instruction Review Code  1- Verbalizes Understanding      Stress and Anxiety: - Provides group verbal and written instruction about the health risks of elevated stress and causes of high stress.  Discuss the correlation between heart/lung disease and anxiety and treatment options. Review healthy ways to manage with stress and anxiety.   Cardiac Rehab from 05/16/2018 in [facility] Cardiac and Pulmonary Rehab  Date  04/27/18  Educator  Lucianne Lei, MSW  Instruction Review Code  1- Verbalizes Understanding      Depression: - Provides group verbal and written instruction on the correlation between heart/lung disease and depressed mood, treatment options, and the stigmas associated with seeking treatment.   Cardiac Rehab from 05/16/2018 in [facility] Cardiac and Pulmonary Rehab  Date  04/13/18  Educator  [facility]  Instruction Review Code  1- Verbalizes Understanding      Anatomy & Physiology of the Heart: - Group verbal and written instruction and  models provide basic cardiac anatomy and physiology, with the coronary electrical and arterial systems. Review of Valvular disease and Heart Failure   Cardiac Rehab from 05/16/2018 in [facility] Cardiac and Pulmonary Rehab  Date  04/25/18  Educator  MJA  Instruction Review Code  1- Verbalizes Understanding      Cardiac Procedures: - Group verbal and written instruction to review commonly prescribed medications for heart disease. Reviews the medication, class of the drug, and side effects. Includes the steps to properly store meds and maintain the prescription regimen. (beta blockers and nitrates)   Cardiac Rehab from 05/16/2018 in [facility] Cardiac and Pulmonary Rehab  Date  05/09/18  Educator  CE  Instruction Review Code  1- Verbalizes Understanding      Cardiac Medications I: - Group verbal and written instruction to review commonly prescribed medications for heart disease. Reviews the medication, class of the drug, and side effects. Includes the steps to properly store meds and maintain the prescription regimen.   Cardiac Medications II: -Group verbal and written instruction to review commonly prescribed medications for heart disease. Reviews the medication, class of the drug, and side effects. (all other drug classes)   Cardiac Rehab from 05/16/2018 in [facility] Cardiac and Pulmonary Rehab  Date  04/20/18  Educator  C. EnterkinRN  Instruction Review Code  1- Verbalizes Understanding       Go Sex-Intimacy & Heart Disease, Get SMART - Goal Setting: - Group verbal and written instruction through game format to discuss heart disease and the return to sexual intimacy. Provides group verbal and written material to discuss and apply goal setting through the application of the S.M.A.R.T. Method.   Cardiac Rehab from 05/16/2018 in [facility] Cardiac and Pulmonary Rehab  Date  05/09/18  Educator  CE  Instruction Review Code  1- Verbalizes Understanding      Other Matters of the Heart: - Provides group  verbal, written materials and models to describe Stable Angina and Peripheral Artery. Includes description of the disease process and treatment options available to the cardiac patient.   Exercise & Equipment Safety: - Individual verbal instruction and demonstration of equipment use and safety with use of the equipment.   Cardiac Rehab from 05/16/2018 in [facility] Cardiac and Pulmonary Rehab  Date  03/08/18  Educator  SB  Instruction Review Code  1- Verbalizes Understanding      Infection Prevention: - Provides verbal and written material to individual with discussion of infection control including proper hand washing and proper equipment cleaning during exercise session.   Cardiac Rehab from 05/16/2018 in [facility] Cardiac and Pulmonary Rehab  Date  03/08/18  Educator  SB  Instruction Review Code  1- Verbalizes Understanding      Falls Prevention: - Provides verbal and written material to individual with discussion of falls prevention and safety.   Cardiac Rehab from 05/16/2018 in [facility] Cardiac and Pulmonary Rehab  Date  03/08/18  Educator  SB  Instruction Review Code  1- Verbalizes Understanding      Diabetes: - Individual verbal and written instruction to review signs/symptoms of diabetes, desired ranges of glucose level fasting, after meals and with exercise. Acknowledge that pre and post exercise glucose checks will be done for 3 sessions at entry of program.   Know Your Numbers and Risk Factors: -Group verbal and written instruction about important numbers in your health.  Discussion of what are risk factors and how they play a role in the disease process.  Review of Cholesterol, Blood Pressure, Diabetes, and BMI and the role they play in your overall health.   Cardiac Rehab from 05/16/2018 in [facility] Cardiac and Pulmonary Rehab  Date  04/20/18  Educator  C. EnterkinRN  Instruction Review Code  1- Verbalizes Understanding      Sleep Hygiene: -Provides group verbal and written  instruction about how sleep can affect your health.  Define sleep hygiene, discuss sleep cycles and impact of sleep habits. Review good sleep hygiene tips.    Cardiac Rehab from 05/16/2018 in [facility] Cardiac and Pulmonary Rehab  Date  03/16/18  Educator  [facility]  Instruction Review Code  1- Verbalizes Understanding      Other: -Provides group and verbal instruction on various topics (see comments)   Knowledge Questionnaire Score: Knowledge Questionnaire Score - 03/08/18 1427      Knowledge Questionnaire Score   Pre Score  24/26   Test reviewed with pt today.  Education Focus: Angina and Exercise      Core Components/Risk Factors/Patient Goals at Admission: Personal Goals and Risk Factors at Admission - 03/08/18 1515      Core Components/Risk Factors/Patient Goals on Admission    Weight Management  Yes;Weight Loss    Admit Weight  179 lb (81.2 kg)    Goal Weight: Short Term  177 lb (80.3 kg)    Goal Weight: Long Term  160 lb (72.6 kg)    Expected Outcomes  Short Term: Continue to assess and modify interventions until short term weight is achieved;Long Term: Adherence to nutrition and physical activity/exercise program aimed toward attainment of established weight goal;Weight Loss: Understanding of general recommendations for a balanced deficit meal plan, which promotes 1-2 lb weight loss per week and includes a negative energy balance of [phone] kcal/d    Hypertension  Yes    Intervention  Provide education on lifestyle modifcations including regular physical activity/exercise, weight management, moderate sodium restriction and increased consumption of fresh fruit, vegetables, and low fat dairy, alcohol moderation, and smoking cessation.;Monitor prescription use compliance.    Expected Outcomes  Short Term: Continued assessment and intervention until BP is < 140/32m HG in hypertensive participants. < 130/826mHG in hypertensive participants with diabetes, heart failure or chronic kidney  disease.;Long Term: Maintenance of blood pressure at goal levels.       Core Components/Risk Factors/Patient Goals Review:  Goals  and Risk Factor Review    Row Name 04/04/18 1642 05/09/18 1634           Core Components/Risk Factors/Patient Goals Review   Personal Goals Review  Weight Management/Obesity;Lipids;Hypertension  Weight Management/Obesity;Lipids;Hypertension      Review  Lynette has been following advice from RD to improve dietary habits.  [name].  She still has a lot of soreness from her incision.  She has spoken with her Dr and will call the surgeon if it doesn't improve.  She is taking all meds as directed - her Dr increased carvedilol last week.  Drucilla missed last week due to bronchitis.  She feels much better - she was able to increase speed on TM and resistance on Nustep.  She is still following RD advice - she is down 10 lb on her scale at home.      Expected Outcomes  Short - Continue to add resistance to machines, lose 1- 2 lb per week  Long - reach goal weight   Short - continue to walk at home and follow RD advice for diet Long - reach goal weight and maintain fitness gains         Core Components/Risk Factors/Patient Goals at Discharge (Final Review):  Goals and Risk Factor Review - 05/09/18 1634      Core Components/Risk Factors/Patient Goals Review   Personal Goals Review  Weight Management/Obesity;Lipids;Hypertension    Review  Nancy missed last week due to bronchitis.  She feels much better - she was able to increase speed on TM and resistance on Nustep.  She is still following RD advice - she is down 10 lb on her scale at home.    Expected Outcomes  Short - continue to walk at home and follow RD advice for diet Long - reach goal weight and maintain fitness gains       ITP Comments: ITP Comments    Row Name 03/08/18 1507 03/23/18 0550 04/20/18 0655 04/25/18 1724 05/18/18 0614   ITP Comments  Medical  evaluation completed today. ITP sent to Dr Loleta Chance for review,changes as needed and signature. Documentation of the diagnosis can be found in [facility] 01/31/2018  30 day review completed. ITP sent to Dr. Emily Filbert, Medical Director of Cardiac Rehab. Continue with ITP unless changes are made by physician  New to program  30 day review.  Continue with ITP unless directed changes per Medical Director review.  Judys 02 dropped to 84 on TM.  She stopped to rest until it came back to 90%.  She resumed exercise at a lower speed and maintained above 90.   30 day review. Continue with ITP unless direccted changes per Medical Director Chart Review.      Comments:

## 2018-05-30 DIAGNOSIS — Z954 Presence of other heart-valve replacement: Secondary | ICD-10-CM

## 2018-05-30 DIAGNOSIS — Z952 Presence of prosthetic heart valve: Secondary | ICD-10-CM | POA: Diagnosis not present

## 2018-05-30 NOTE — Progress Notes (Signed)
Discharge Progress Report  Patient Details  Name: Joyce Evans MRN: [id number] Date of Birth: [birth year] Referring Provider:     Cardiac Rehab from 03/08/2018 in [facility]  Referring Provider  Ward, Cary MD       Number of Visits: 36  Reason for Discharge:  Patient reached a stable level of exercise. Patient independent in their exercise. Patient has met program and personal goals.  Smoking History:  Social History   Tobacco Use  Smoking Status Never Smoker  Smokeless Tobacco Never Used    Diagnosis:  S/P tricuspid valve replacement  ADL UCSD:   Initial Exercise Prescription: Initial Exercise Prescription - 03/08/18 1400      Date of Initial Exercise RX and Referring Provider   Date  03/08/18    Referring Provider  Ward, Cary MD      Treadmill   MPH  2    Grade  0    Minutes  15    METs  2.53      NuStep   Level  2    SPM  80    Minutes  15    METs  2.5      Recumbant Elliptical   Level  1    RPM  50    Minutes  15    METs  2.5      Prescription Details   Frequency (times per week)  3    Duration  Progress to 45 minutes of aerobic exercise without signs/symptoms of physical distress      Intensity   THRR 40-80% of Max Heartrate  96-129    Ratings of Perceived Exertion  11-13    Perceived Dyspnea  0-4      Progression   Progression  Continue to progress workloads to maintain intensity without signs/symptoms of physical distress.      Resistance Training   Training Prescription  Yes    Weight  3 lbs    Reps  10-15       Discharge Exercise Prescription (Final Exercise Prescription Changes): Exercise Prescription Changes - 05/25/18 1200      Response to Exercise   Blood Pressure (Admit)  138/80    Blood Pressure (Exercise)  208/84   recheck 172/82   Blood Pressure (Exit)  122/60    Heart Rate (Admit)  67 bpm    Heart Rate (Exercise)  122 bpm    Heart Rate (Exit)  75 bpm    Rating of Perceived Exertion  (Exercise)  15    Symptoms  none    Comments  post walk test    Duration  Continue with 45 min of aerobic exercise without signs/symptoms of physical distress.    Intensity  THRR unchanged      Progression   Progression  Continue to progress workloads to maintain intensity without signs/symptoms of physical distress.      Resistance Training   Training Prescription  Yes    Weight  3 lb    Reps  10-15      Interval Training   Interval Training  No      Treadmill   MPH  2    Grade  2    Minutes  15    METs  3.08      NuStep   Level  6    SPM  80    Minutes  15    METs  2.3       Functional Capacity: 6  Minute Walk    Row Name 03/08/18 1451         6 Minute Walk   Phase  Initial     Distance  1265 feet     Walk Time  6 minutes     # of Rest Breaks  0     MPH  2.4     METS  2.54     RPE  13     Perceived Dyspnea   1     VO2 Peak  8.89     Symptoms  Yes (comment)     Comments  SOB, twinge in low back     Resting HR  64 bpm     Resting BP  128/74     Resting Oxygen Saturation   99 %     Exercise Oxygen Saturation  during 6 min walk  94 %     Max Ex. HR  112 bpm     Max Ex. BP  154/84     2 Minute Post BP  146/64        Psychological, QOL, Others - Outcomes: PHQ 2/9: Depression screen PHQ 2/9 03/08/2018  Decreased Interest 1  Down, Depressed, Hopeless 0  PHQ - 2 Score 1  Altered sleeping 0  Tired, decreased energy 1  Change in appetite 0  Feeling bad or failure about yourself  0  Trouble concentrating 0  Moving slowly or fidgety/restless 0  Suicidal thoughts 0  PHQ-9 Score 2  Difficult doing work/chores Not difficult at all    Quality of Life: Quality of Life - 03/08/18 1428      Quality of Life   Select  Quality of Life      Quality of Life Scores   Health/Function Pre  26.8 %    Socioeconomic Pre  28.75 %    Psych/Spiritual Pre  29.14 %    Family Pre  28.8 %    GLOBAL Pre  27.95 %       Personal Goals: Goals established at  orientation with interventions provided to work toward goal. Personal Goals and Risk Factors at Admission - 03/08/18 1515      Core Components/Risk Factors/Patient Goals on Admission    Weight Management  Yes;Weight Loss    Admit Weight  179 lb (81.2 kg)    Goal Weight: Short Term  177 lb (80.3 kg)    Goal Weight: Long Term  160 lb (72.6 kg)    Expected Outcomes  Short Term: Continue to assess and modify interventions until short term weight is achieved;Long Term: Adherence to nutrition and physical activity/exercise program aimed toward attainment of established weight goal;Weight Loss: Understanding of general recommendations for a balanced deficit meal plan, which promotes 1-2 lb weight loss per week and includes a negative energy balance of [phone] kcal/d    Hypertension  Yes    Intervention  Provide education on lifestyle modifcations including regular physical activity/exercise, weight management, moderate sodium restriction and increased consumption of fresh fruit, vegetables, and low fat dairy, alcohol moderation, and smoking cessation.;Monitor prescription use compliance.    Expected Outcomes  Short Term: Continued assessment and intervention until BP is < 140/44m HG in hypertensive participants. < 130/872mHG in hypertensive participants with diabetes, heart failure or chronic kidney disease.;Long Term: Maintenance of blood pressure at goal levels.        Personal Goals Discharge: Goals and Risk Factor Review    Row Name 04/04/18 1642 05/09/18 1634  Core Components/Risk Factors/Patient Goals Review   Personal Goals Review  Weight Management/Obesity;Lipids;Hypertension  Weight Management/Obesity;Lipids;Hypertension      Review  Joyce Evans has been following advice from RD to improve dietary habits.  [name].  She still has a lot of soreness from her incision.  She has spoken with her Dr and will call the surgeon if  it doesn't improve.  She is taking all meds as directed - her Dr increased carvedilol last week.  Joyce Evans missed last week due to bronchitis.  She feels much better - she was able to increase speed on TM and resistance on Nustep.  She is still following RD advice - she is down 10 lb on her scale at home.      Expected Outcomes  Short - Continue to add resistance to machines, lose 1- 2 lb per week  Long - reach goal weight   Short - continue to walk at home and follow RD advice for diet Long - reach goal weight and maintain fitness gains         Exercise Goals and Review: Exercise Goals    Row Name 03/08/18 1457             Exercise Goals   Increase Physical Activity  Yes       Intervention  Provide advice, education, support and counseling about physical activity/exercise needs.;Develop an individualized exercise prescription for aerobic and resistive training based on initial evaluation findings, risk stratification, comorbidities and participant's personal goals.       Expected Outcomes  Short Term: Attend rehab on a regular basis to increase amount of physical activity.;Long Term: Add in home exercise to make exercise part of routine and to increase amount of physical activity.;Long Term: Exercising regularly at least 3-5 days a week.       Increase Strength and Stamina  Yes       Intervention  Provide advice, education, support and counseling about physical activity/exercise needs.;Develop an individualized exercise prescription for aerobic and resistive training based on initial evaluation findings, risk stratification, comorbidities and participant's personal goals.       Expected Outcomes  Short Term: Increase workloads from initial exercise prescription for resistance, speed, and METs.;Short Term: Perform resistance training exercises routinely during rehab and add in resistance training at home;Long Term: Improve cardiorespiratory fitness, muscular endurance and strength as measured by  increased METs and functional capacity (6MWT)       Able to understand and use rate of perceived exertion (RPE) scale  Yes       Intervention  Provide education and explanation on how to use RPE scale       Expected Outcomes  Short Term: Able to use RPE daily in rehab to express subjective intensity level;Long Term:  Able to use RPE to guide intensity level when exercising independently       Able to understand and use Dyspnea scale  Yes       Intervention  Provide education and explanation on how to use Dyspnea scale       Expected Outcomes  Short Term: Able to use Dyspnea scale daily in rehab to express subjective sense of shortness of breath during exertion;Long Term: Able to use Dyspnea scale to guide intensity level when exercising independently       Knowledge and understanding of Target Heart Rate Range (THRR)  Yes       Intervention  Provide  education and explanation of THRR including how the numbers were predicted and where they are located for reference       Expected Outcomes  Short Term: Able to use daily as guideline for intensity in rehab;Short Term: Able to state/look up THRR;Long Term: Able to use THRR to govern intensity when exercising independently       Able to check pulse independently  Yes       Intervention  Provide education and demonstration on how to check pulse in carotid and radial arteries.;Review the importance of being able to check your own pulse for safety during independent exercise       Expected Outcomes  Short Term: Able to explain why pulse checking is important during independent exercise;Long Term: Able to check pulse independently and accurately       Understanding of Exercise Prescription  Yes       Intervention  Provide education, explanation, and written materials on patient's individual exercise prescription       Expected Outcomes  Short Term: Able to explain program exercise prescription;Long Term: Able to explain home exercise prescription to exercise  independently          Exercise Goals Re-Evaluation: Exercise Goals Re-Evaluation    Row Name 03/16/18 1642 03/30/18 1249 04/14/18 0813 05/10/18 1538 05/25/18 1209     Exercise Goal Re-Evaluation   Exercise Goals Review  Increase Physical Activity;Able to understand and use rate of perceived exertion (RPE) scale;Knowledge and understanding of Target Heart Rate Range (THRR);Understanding of Exercise Prescription;Increase Strength and Stamina  Increase Physical Activity;Increase Strength and Stamina;Able to understand and use rate of perceived exertion (RPE) scale  Increase Physical Activity;Increase Strength and Stamina;Able to understand and use rate of perceived exertion (RPE) scale;Knowledge and understanding of Target Heart Rate Range (THRR);Able to check pulse independently  Increase Physical Activity;Increase Strength and Stamina;Able to understand and use rate of perceived exertion (RPE) scale;Able to understand and use Dyspnea scale;Knowledge and understanding of Target Heart Rate Range (THRR);Understanding of Exercise Prescription;Able to check pulse independently  Increase Physical Activity;Increase Strength and Stamina;Able to understand and use rate of perceived exertion (RPE) scale;Knowledge and understanding of Target Heart Rate Range (THRR);Able to check pulse independently;Understanding of Exercise Prescription   Comments  Reviewed RPE scale, THR and program prescription with pt today.  Pt voiced understanding and was given a copy of goals to take home.   Estrella has just started HT this month.  She will see more improvement if she attends consistently.  She has maintained MET level so far.  Delilah is a member at [employer].  She [facility] join [employer] [facility].    Dewanna missed last week due to bronchitis.  She is missing some this week due to travel.  She states her overall stamina is much better and she plans to continue in FF.  Caterra had bronchitis a couple weeks ago  and this affected her 02 sats.  She states she feel sstronger overall and her MET level imroved based on last walk test.   Expected Outcomes  Short: Use RPE daily to regulate intensity. Long: Follow program prescription in THR.  SHort - attend 3 days per week Long - increase MET level  Short - exercise on her own  1-2 days outside HT Long - exercise independently 3-5 times per week  Short - complete HT class  Long - maintain exercise in FF  Short - complete HT Long - maintain  exercise on her own      Nutrition & Weight - Outcomes: Pre Biometrics - 03/08/18 1458      Pre Biometrics   Height  5' 3.2" (1.605 m)    Weight  179 lb 8 oz (81.4 kg)    Waist Circumference  34 inches    Hip Circumference  43 inches    Waist to Hip Ratio  0.79 %    BMI (Calculated)  31.61    Single Leg Stand  1.81 seconds        Nutrition: Nutrition Therapy & Goals - 03/30/18 1631      Nutrition Therapy   Diet  DASH/ TLC    Protein (specify units)  6oz    Fiber  30 grams    Whole Grain Foods  3 servings   chooses whole grains   Saturated Fats  12 max. grams    Fruits and Vegetables  6 servings/day   8 ideal   Sodium  1500 grams      Personal Nutrition Goals   Nutrition Goal  Be more mindful of the sodium content in foods by starting to read nutrition facts labels and make more informed choices when eating out. When eating at [facility], you can look up the nutrition information ahead of time and plan what low-sodium options you will choose    Personal Goal #2  As you continue to recover and regain more energy, work to eat at home more often to cut back on hidden sources of sodium, fat and sugar     Personal Goal #3  Continue to choose whole grain foods and to consume sources of healthy fat, such as your avocado oil and nuts- great job!    Comments  She has been cooking at home less often since her surgery at the end of July. Her husband is a diabetic and so the two of them work together to have a lower sugar  diet. They struggle to monitor sodium intake but do enjoy a variety of fruits, vegetables, protein sources and grains. She enjoys yogurt as well.      Intervention Plan   Intervention  Prescribe, educate and counsel regarding individualized specific dietary modifications aiming towards targeted core components such as weight, hypertension, lipid management, diabetes, heart failure and other comorbidities.;Nutrition handout(s) given to patient.   sodium, food labels, and dining out packet   Expected Outcomes  Short Term Goal: Understand basic principles of dietary content, such as calories, fat, sodium, cholesterol and nutrients.;Short Term Goal: A plan has been developed with personal nutrition goals set during dietitian appointment.;Long Term Goal: Adherence to prescribed nutrition plan.       Nutrition Discharge: Nutrition Assessments - 03/08/18 1428      MEDFICTS Scores   Pre Score  40       Education Questionnaire Score: Knowledge Questionnaire Score - 03/08/18 1427      Knowledge Questionnaire Score   Pre Score  24/26   Test reviewed with pt today.  Education Focus: Angina and Exercise      Goals reviewed with patient; copy given to patient.

## 2018-05-30 NOTE — Progress Notes (Signed)
Daily Session Note  Patient Details  Name: Joyce Evans MRN: [id number] Date of Birth: 11/23/1940 Referring Provider:     Cardiac Rehab from 03/08/2018 in [facility] Cardiac and Pulmonary Rehab  Referring Provider  Ward, Jeani Hawking MD      Encounter Date: 05/30/2018  Check In: Session Check In - 05/30/18 1658      Check-In   Supervising physician immediately available to respond to emergencies  See telemetry face sheet for immediately available ER MD    Location  [facility]-Cardiac & Pulmonary Rehab    Staff Present  Earlean Shawl, BS, ACSM CEP, Exercise Physiologist;Carroll Enterkin, RN, Vickki Hearing, BA, ACSM CEP, Exercise Physiologist    Medication changes reported      Yes    Comments  added Losartin 25 mg    Fall or balance concerns reported     No    Warm-up and Cool-down  Performed on first and last piece of equipment    Resistance Training Performed  Yes    VAD Patient?  No    PAD/SET Patient?  No      Pain Assessment   Currently in Pain?  No/denies    Multiple Pain Sites  No          Social History   Tobacco Use  Smoking Status Never Smoker  Smokeless Tobacco Never Used    Goals Met:  Independence with exercise equipment Exercise tolerated well No report of cardiac concerns or symptoms Strength training completed today  Goals Unmet:  Not Applicable  Comments:  Henriette graduated today from  rehab with 36 sessions completed.  Details of the patient's exercise prescription and what She needs to do in order to continue the prescription and progress were discussed with patient.  Patient was given a copy of prescription and goals.  Patient verbalized understanding.  Abeeha plans to continue to exercise by coming to [employer].    Dr. Emily Filbert is Medical Director for [place] [name].

## 2018-05-30 NOTE — Patient Instructions (Signed)
Discharge Patient Instructions  Patient Details  Name: Joyce Evans MRN: [id number] Date of Birth: [birth year] Referring Provider:  Joyice Faster, MD   Number of Visits: 20  Reason for Discharge:  Patient reached a stable level of exercise. Patient independent in their exercise. Patient has met program and personal goals.  Smoking History:  Social History   Tobacco Use  Smoking Status Never Smoker  Smokeless Tobacco Never Used    Diagnosis:  No diagnosis found.  Initial Exercise Prescription: Initial Exercise Prescription - 03/08/18 1400      Date of Initial Exercise RX and Referring Provider   Date  03/08/18    Referring Provider  Ward, Jeani Hawking MD      Treadmill   MPH  2    Grade  0    Minutes  15    METs  2.53      NuStep   Level  2    SPM  80    Minutes  15    METs  2.5      Recumbant Elliptical   Level  1    RPM  50    Minutes  15    METs  2.5      Prescription Details   Frequency (times per week)  3    Duration  Progress to 45 minutes of aerobic exercise without signs/symptoms of physical distress      Intensity   THRR 40-80% of Max Heartrate  96-129    Ratings of Perceived Exertion  11-13    Perceived Dyspnea  0-4      Progression   Progression  Continue to progress workloads to maintain intensity without signs/symptoms of physical distress.      Resistance Training   Training Prescription  Yes    Weight  3 lbs    Reps  10-15       Discharge Exercise Prescription (Final Exercise Prescription Changes): Exercise Prescription Changes - 05/25/18 1200      Response to Exercise   Blood Pressure (Admit)  138/80    Blood Pressure (Exercise)  208/84   recheck 172/82   Blood Pressure (Exit)  122/60    Heart Rate (Admit)  67 bpm    Heart Rate (Exercise)  122 bpm    Heart Rate (Exit)  75 bpm    Rating of Perceived Exertion (Exercise)  15    Symptoms  none    Comments  post walk test    Duration  Continue with 45 min of aerobic exercise without  signs/symptoms of physical distress.    Intensity  THRR unchanged      Progression   Progression  Continue to progress workloads to maintain intensity without signs/symptoms of physical distress.      Resistance Training   Training Prescription  Yes    Weight  3 lb    Reps  10-15      Interval Training   Interval Training  No      Treadmill   MPH  2    Grade  2    Minutes  15    METs  3.08      NuStep   Level  6    SPM  80    Minutes  15    METs  2.3       Functional Capacity: 6 Minute Walk    Row Name 03/08/18 1451         6 Minute Walk   Phase  Initial  Distance  1265 feet     Walk Time  6 minutes     # of Rest Breaks  0     MPH  2.4     METS  2.54     RPE  13     Perceived Dyspnea   1     VO2 Peak  8.89     Symptoms  Yes (comment)     Comments  SOB, twinge in low back     Resting HR  64 bpm     Resting BP  128/74     Resting Oxygen Saturation   99 %     Exercise Oxygen Saturation  during 6 min walk  94 %     Max Ex. HR  112 bpm     Max Ex. BP  154/84     2 Minute Post BP  146/64        Quality of Life: Quality of Life - 03/08/18 1428      Quality of Life   Select  Quality of Life      Quality of Life Scores   Health/Function Pre  26.8 %    Socioeconomic Pre  28.75 %    Psych/Spiritual Pre  29.14 %    Family Pre  28.8 %    GLOBAL Pre  27.95 %       Personal Goals: Goals established at orientation with interventions provided to work toward goal. Personal Goals and Risk Factors at Admission - 03/08/18 1515      Core Components/Risk Factors/Patient Goals on Admission    Weight Management  Yes;Weight Loss    Admit Weight  179 lb (81.2 kg)    Goal Weight: Short Term  177 lb (80.3 kg)    Goal Weight: Long Term  160 lb (72.6 kg)    Expected Outcomes  Short Term: Continue to assess and modify interventions until short term weight is achieved;Long Term: Adherence to nutrition and physical activity/exercise program aimed toward attainment of  established weight goal;Weight Loss: Understanding of general recommendations for a balanced deficit meal plan, which promotes 1-2 lb weight loss per week and includes a negative energy balance of [phone] kcal/d    Hypertension  Yes    Intervention  Provide education on lifestyle modifcations including regular physical activity/exercise, weight management, moderate sodium restriction and increased consumption of fresh fruit, vegetables, and low fat dairy, alcohol moderation, and smoking cessation.;Monitor prescription use compliance.    Expected Outcomes  Short Term: Continued assessment and intervention until BP is < 140/73m HG in hypertensive participants. < 130/823mHG in hypertensive participants with diabetes, heart failure or chronic kidney disease.;Long Term: Maintenance of blood pressure at goal levels.        Personal Goals Discharge: Goals and Risk Factor Review - 05/09/18 1634      Core Components/Risk Factors/Patient Goals Review   Personal Goals Review  Weight Management/Obesity;Lipids;Hypertension    Review  JuVictoriahissed last week due to bronchitis.  She feels much better - she was able to increase speed on TM and resistance on Nustep.  She is still following RD advice - she is down 10 lb on her scale at home.    Expected Outcomes  Short - continue to walk at home and follow RD advice for diet Long - reach goal weight and maintain fitness gains       Exercise Goals and Review: Exercise Goals    Row Name 03/08/18 1457  Exercise Goals   Increase Physical Activity  Yes       Intervention  Provide advice, education, support and counseling about physical activity/exercise needs.;Develop an individualized exercise prescription for aerobic and resistive training based on initial evaluation findings, risk stratification, comorbidities and participant's personal goals.       Expected Outcomes  Short Term: Attend rehab on a regular basis to increase amount of physical  activity.;Long Term: Add in home exercise to make exercise part of routine and to increase amount of physical activity.;Long Term: Exercising regularly at least 3-5 days a week.       Increase Strength and Stamina  Yes       Intervention  Provide advice, education, support and counseling about physical activity/exercise needs.;Develop an individualized exercise prescription for aerobic and resistive training based on initial evaluation findings, risk stratification, comorbidities and participant's personal goals.       Expected Outcomes  Short Term: Increase workloads from initial exercise prescription for resistance, speed, and METs.;Short Term: Perform resistance training exercises routinely during rehab and add in resistance training at home;Long Term: Improve cardiorespiratory fitness, muscular endurance and strength as measured by increased METs and functional capacity (6MWT)       Able to understand and use rate of perceived exertion (RPE) scale  Yes       Intervention  Provide education and explanation on how to use RPE scale       Expected Outcomes  Short Term: Able to use RPE daily in rehab to express subjective intensity level;Long Term:  Able to use RPE to guide intensity level when exercising independently       Able to understand and use Dyspnea scale  Yes       Intervention  Provide education and explanation on how to use Dyspnea scale       Expected Outcomes  Short Term: Able to use Dyspnea scale daily in rehab to express subjective sense of shortness of breath during exertion;Long Term: Able to use Dyspnea scale to guide intensity level when exercising independently       Knowledge and understanding of Target Heart Rate Range (THRR)  Yes       Intervention  Provide education and explanation of THRR including how the numbers were predicted and where they are located for reference       Expected Outcomes  Short Term: Able to use daily as guideline for intensity in rehab;Short Term: Able to  state/look up THRR;Long Term: Able to use THRR to govern intensity when exercising independently       Able to check pulse independently  Yes       Intervention  Provide education and demonstration on how to check pulse in carotid and radial arteries.;Review the importance of being able to check your own pulse for safety during independent exercise       Expected Outcomes  Short Term: Able to explain why pulse checking is important during independent exercise;Long Term: Able to check pulse independently and accurately       Understanding of Exercise Prescription  Yes       Intervention  Provide education, explanation, and written materials on patient's individual exercise prescription       Expected Outcomes  Short Term: Able to explain program exercise prescription;Long Term: Able to explain home exercise prescription to exercise independently          Exercise Goals Re-Evaluation: Exercise Goals Re-Evaluation    Row Name 03/16/18 1642 03/30/18 1249 04/14/18 0813 05/10/18  1538 05/25/18 1209     Exercise Goal Re-Evaluation   Exercise Goals Review  Increase Physical Activity;Able to understand and use rate of perceived exertion (RPE) scale;Knowledge and understanding of Target Heart Rate Range (THRR);Understanding of Exercise Prescription;Increase Strength and Stamina  Increase Physical Activity;Increase Strength and Stamina;Able to understand and use rate of perceived exertion (RPE) scale  Increase Physical Activity;Increase Strength and Stamina;Able to understand and use rate of perceived exertion (RPE) scale;Knowledge and understanding of Target Heart Rate Range (THRR);Able to check pulse independently  Increase Physical Activity;Increase Strength and Stamina;Able to understand and use rate of perceived exertion (RPE) scale;Able to understand and use Dyspnea scale;Knowledge and understanding of Target Heart Rate Range (THRR);Understanding of Exercise Prescription;Able to check pulse independently   Increase Physical Activity;Increase Strength and Stamina;Able to understand and use rate of perceived exertion (RPE) scale;Knowledge and understanding of Target Heart Rate Range (THRR);Able to check pulse independently;Understanding of Exercise Prescription   Comments  Reviewed RPE scale, THR and program prescription with pt today.  Pt voiced understanding and was given a copy of goals to take home.   Melondy has just started HT this month.  She will see more improvement if she attends consistently.  She has maintained MET level so far.  Natali is a member at [employer].  She plans [facility] join [employer] [facility]    Margueritte missed last week due to bronchitis.  She is missing some this week due to travel.  She states her overall stamina is much better and she plans to continue in FF.  Amery had bronchitis a couple weeks ago and this affected her 02 sats.  She states she feel sstronger overall and her MET level imroved based on last walk test.   Expected Outcomes  Short: Use RPE daily to regulate intensity. Long: Follow program prescription in THR.  SHort - attend 3 days per week Long - increase MET level  Short - exercise on her own  1-2 days outside HT Long - exercise independently 3-5 times per week  Short - complete HT class  Long - maintain exercise in FF  Short - complete HT Long - maintain exercise on her own      Nutrition & Weight - Outcomes: Pre Biometrics - 03/08/18 1458      Pre Biometrics   Height  5' 3.2" (1.605 m)    Weight  179 lb 8 oz (81.4 kg)    Waist Circumference  34 inches    Hip Circumference  43 inches    Waist to Hip Ratio  0.79 %    BMI (Calculated)  31.61    Single Leg Stand  1.81 seconds        Nutrition: Nutrition Therapy & Goals - 03/30/18 1631      Nutrition Therapy   Diet  DASH/ TLC    Protein (specify units)  6oz    Fiber  30 grams    Whole Grain Foods  3 servings   chooses whole grains   Saturated Fats  12 max. grams    Fruits and  Vegetables  6 servings/day   8 ideal   Sodium  1500 grams      Personal Nutrition Goals   Nutrition Goal  Be more mindful of the sodium content in foods by starting to read nutrition facts labels and make more informed choices when eating out. When eating at [facility], you can look up the nutrition information ahead of  time and plan what low-sodium options you will choose    Personal Goal #2  As you continue to recover and regain more energy, work to eat at home more often to cut back on hidden sources of sodium, fat and sugar     Personal Goal #3  Continue to choose whole grain foods and to consume sources of healthy fat, such as your avocado oil and nuts- great job!    Comments  She has been cooking at home less often since her surgery at the end of July. Her husband is a diabetic and so the two of them work together to have a lower sugar diet. They struggle to monitor sodium intake but do enjoy a variety of fruits, vegetables, protein sources and grains. She enjoys yogurt as well.      Intervention Plan   Intervention  Prescribe, educate and counsel regarding individualized specific dietary modifications aiming towards targeted core components such as weight, hypertension, lipid management, diabetes, heart failure and other comorbidities.;Nutrition handout(s) given to patient.   sodium, food labels, and dining out packet   Expected Outcomes  Short Term Goal: Understand basic principles of dietary content, such as calories, fat, sodium, cholesterol and nutrients.;Short Term Goal: A plan has been developed with personal nutrition goals set during dietitian appointment.;Long Term Goal: Adherence to prescribed nutrition plan.       Nutrition Discharge: Nutrition Assessments - 03/08/18 1428      MEDFICTS Scores   Pre Score  40       Education Questionnaire Score: Knowledge Questionnaire Score - 03/08/18 1427      Knowledge Questionnaire Score   Pre Score  24/26   Test reviewed with pt today.   Education Focus: Angina and Exercise      Goals reviewed with patient; copy given to patient.

## 2018-05-30 NOTE — Progress Notes (Signed)
Cardiac Individual Treatment Plan  Patient Details  Name: Joyce Evans MRN: [id number] Date of Birth: 1941/07/03 Referring Provider:     Cardiac Rehab from 03/08/2018 in [facility] Cardiac and Pulmonary Rehab  Referring Provider  Ward, Jeani Hawking MD      Initial Encounter Date:    Cardiac Rehab from 03/08/2018 in [facility] Cardiac and Pulmonary Rehab  Date  03/08/18      Visit Diagnosis: S/P tricuspid valve replacement  Patient's Home Medications on Admission:  Current Outpatient Medications:  .  acetaminophen (TYLENOL) 500 MG tablet, Take 500 mg by mouth every 6 (six) hours as needed., Disp: , Rfl:  .  ALPRAZolam (XANAX) 0.25 MG tablet, Take 0.125 mg by mouth at bedtime., Disp: , Rfl:  .  apixaban (ELIQUIS) 5 MG TABS tablet, TAKE 1 TABLET BY MOUTH TWO  TIMES DAILY, Disp: , Rfl:  .  brimonidine (ALPHAGAN P) 0.1 % SOLN, Place 1 drop into the right eye 2 (two) times daily., Disp: , Rfl:  .  Brinzolamide-Brimonidine (SIMBRINZA) 1-0.2 % SUSP, Place 1 drop into the left eye 2 (two) times daily., Disp: , Rfl:  .  Calcium-Phosphorus-Vitamin D (CITRACAL +D3 PO), Take by mouth daily., Disp: , Rfl:  .  Cholecalciferol (VITAMIN D3) 3000 units TABS, Take by mouth 2 (two) times daily., Disp: , Rfl:  .  famotidine-calcium carbonate-magnesium hydroxide (PEPCID COMPLETE) 10-800-165 MG chewable tablet, Chew 1 tablet by mouth daily as needed., Disp: , Rfl:  .  furosemide (LASIX) 20 MG tablet, Take by mouth., Disp: , Rfl:  .  multivitamin-iron-minerals-folic acid (CENTRUM) chewable tablet, Chew 1 tablet by mouth daily., Disp: , Rfl:  .  omeprazole (PRILOSEC) 20 MG capsule, Take 20 mg by mouth 2 (two) times daily. Reported on 08/28/2015, Disp: , Rfl:  .  potassium chloride (K-DUR) 10 MEQ tablet, Take by mouth., Disp: , Rfl:  .  Probiotic Product (PROBIOTIC DAILY PO), Take by mouth daily., Disp: , Rfl:  .  timolol (BETIMOL) 0.5 % ophthalmic solution, Place 1 drop into both eyes daily., Disp: , Rfl:   Past Medical  History: Past Medical History:  Diagnosis Date  . Arthritis    osteo - shoulders, fingers  . Atrial fibrillation ([place])   . Dizziness    thinks because of diuretic  . Family history of adverse reaction to anesthesia    daughter -PONV  . Gallstones 2016, 2017  . GERD (gastroesophageal reflux disease)   . Glaucoma   . Heart murmur    history of  . Hypertension   . Numbness in left leg    s/p hematoma  . Sciatica    right  . Seasonal allergies   . Skin cancer   . Tricuspid valve disorder    leak    Tobacco Use: Social History   Tobacco Use  Smoking Status Never Smoker  Smokeless Tobacco Never Used    Labs: Recent Review Flowsheet Data    There is no flowsheet data to display.       Exercise Target Goals: Exercise Program Goal: Individual exercise prescription set using results from initial 6 min walk test and THRR while considering  patient's activity barriers and safety.   Exercise Prescription Goal: Initial exercise prescription builds to 30-45 minutes a day of aerobic activity, 2-3 days per week.  Home exercise guidelines will be given to patient during program as part of exercise prescription that the participant will acknowledge.  Activity Barriers & Risk Stratification: Activity Barriers & Cardiac Risk Stratification - 03/08/18  1452      Activity Barriers & Cardiac Risk Stratification   Activity Barriers  Deconditioning;Muscular Weakness;Shortness of Breath;History of Falls;Balance Concerns;Joint Problems   R arm sore since surgery, L shoulder very little cartilidge, limited OM   Cardiac Risk Stratification  Moderate       6 Minute Walk: 6 Minute Walk    Row Name 03/08/18 1451         6 Minute Walk   Phase  Initial     Distance  1265 feet     Walk Time  6 minutes     # of Rest Breaks  0     MPH  2.4     METS  2.54     RPE  13     Perceived Dyspnea   1     VO2 Peak  8.89     Symptoms  Yes (comment)     Comments  SOB, twinge in low back      Resting HR  64 bpm     Resting BP  128/74     Resting Oxygen Saturation   99 %     Exercise Oxygen Saturation  during 6 min walk  94 %     Max Ex. HR  112 bpm     Max Ex. BP  154/84     2 Minute Post BP  146/64        Oxygen Initial Assessment:   Oxygen Re-Evaluation:   Oxygen Discharge (Final Oxygen Re-Evaluation):   Initial Exercise Prescription: Initial Exercise Prescription - 03/08/18 1400      Date of Initial Exercise RX and Referring Provider   Date  03/08/18    Referring Provider  Ward, Jeani Hawking MD      Treadmill   MPH  2    Grade  0    Minutes  15    METs  2.53      NuStep   Level  2    SPM  80    Minutes  15    METs  2.5      Recumbant Elliptical   Level  1    RPM  50    Minutes  15    METs  2.5      Prescription Details   Frequency (times per week)  3    Duration  Progress to 45 minutes of aerobic exercise without signs/symptoms of physical distress      Intensity   THRR 40-80% of Max Heartrate  96-129    Ratings of Perceived Exertion  11-13    Perceived Dyspnea  0-4      Progression   Progression  Continue to progress workloads to maintain intensity without signs/symptoms of physical distress.      Resistance Training   Training Prescription  Yes    Weight  3 lbs    Reps  10-15       Perform Capillary Blood Glucose checks as needed.  Exercise Prescription Changes: Exercise Prescription Changes    Row Name 03/08/18 1400 03/17/18 0800 03/30/18 1200 04/13/18 1700 04/14/18 0800     Response to Exercise   Blood Pressure (Admit)  128/74  124/68  116/60  -  124/66   Blood Pressure (Exercise)  154/84  138/70  160/80  -  118/68   Blood Pressure (Exit)  146/64  120/62  130/70  -  104/56   Heart Rate (Admit)  64 bpm  66 bpm  70 bpm  -  101 bpm   Heart Rate (Exercise)  112 bpm  106 bpm  118 bpm  -  98 bpm   Heart Rate (Exit)  81 bpm  81 bpm  83 bpm  -  70 bpm   Oxygen Saturation (Admit)  99 %  -  -  -  -   Oxygen Saturation (Exercise)  94 %  -   -  -  -   Rating of Perceived Exertion (Exercise)  _0 -  -   Perceived Dyspnea (Exercise)  1  -  -  -  -   Symptoms  SOB, low back twinge  -  -  -  none   Comments  walk test results  1st day  -  -  -   Duration  -  Progress to 45 minutes of aerobic exercise without signs/symptoms of physical distress  Progress to 45 minutes of aerobic exercise without signs/symptoms of physical distress  -  Progress to 45 minutes of aerobic exercise without signs/symptoms of physical distress   Intensity  -  THRR unchanged  THRR unchanged  -  THRR unchanged     Progression   Progression  -  Continue to progress workloads to maintain intensity without signs/symptoms of physical distress.  Continue to progress workloads to maintain intensity without signs/symptoms of physical distress.  -  Continue to progress workloads to maintain intensity without signs/symptoms of physical distress.   Average METs  -  2.3  2.2  -  -     Resistance Training   Training Prescription  -  Yes  Yes  -  Yes   Weight  -  3 lb  3 lb  -  3 lb   Reps  -  10-15  10-15  -  10-15     Interval Training   Interval Training  -  -  No  -  -     Treadmill   MPH  -  2  2  -  2   Grade  -  0  0  -  0.5   Minutes  -  15  15  -  15   METs  -  2.53  2.53  -  2.67     NuStep   Level  -  2  2  -  3   SPM  -  80  80  -  80   Minutes  -  15  15  -  15   METs  -  2.1  1.8  -  1.8     Home Exercise Plan   Plans to continue exercise at  -  -  -  [employer] (comment) [employer] [facility] [employer] [facility] [employer] [facility]   Frequency  -  -  -  Add 2 additional days to program exercise sessions.  Add 2 additional days to program exercise sessions.   Initial Home Exercises Provided  -  -  -  04/13/18  04/13/18   Row Name 05/10/18 1500 05/25/18 1200           Response to Exercise   Blood Pressure (Admit)  146/88  138/80      Blood Pressure (Exercise)  138/64  208/84 recheck 172/82      Blood Pressure (Exit)  130/70  122/60       Heart Rate (Admit)  80 bpm  67 bpm  Heart Rate (Exercise)  118 bpm  122 bpm      Heart Rate (Exit)  95 bpm  75 bpm      Rating of Perceived Exertion (Exercise)  12  15      Symptoms  none  none      Comments  -  post walk test      Duration  Progress to 45 minutes of aerobic exercise without signs/symptoms of physical distress  Continue with 45 min of aerobic exercise without signs/symptoms of physical distress.      Intensity  THRR unchanged  THRR unchanged        Progression   Progression  Continue to progress workloads to maintain intensity without signs/symptoms of physical distress.  Continue to progress workloads to maintain intensity without signs/symptoms of physical distress.      Average METs  3.45  -        Resistance Training   Training Prescription  Yes  Yes      Weight  3 lb  3 lb      Reps  10-15  10-15        Interval Training   Interval Training  No  No        Treadmill   MPH  2  2      Grade  2  2      Minutes  15  15      METs  3.08  3.08        NuStep   Level  6  6      SPM  80  80      Minutes  15  15      METs  3.9  2.3         Exercise Comments: Exercise Comments    Row Name 03/16/18 1642 04/13/18 1749         Exercise Comments  First full day of exercise!  Patient was oriented to gym and equipment including functions, settings, policies, and procedures.  Patient's individual exercise prescription and treatment plan were reviewed.  All starting workloads were established based on the results of the 6 minute walk test done at initial orientation visit.  The plan for exercise progression was also introduced and progression will be customized based on patient's performance and goals.  Reviewed home exercise with pt today.  Pt plans to go to [facility] and [facility] for exercise.  Reviewed THR, pulse, RPE, sign and symptoms, NTG use, and when to call 911 or MD.  Also discussed weather considerations and indoor options.  Pt voiced understanding.          Exercise Goals and Review: Exercise Goals    Row Name 03/08/18 1457             Exercise Goals   Increase Physical Activity  Yes       Intervention  Provide advice, education, support and counseling about physical activity/exercise needs.;Develop an individualized exercise prescription for aerobic and resistive training based on initial evaluation findings, risk stratification, comorbidities and participant's personal goals.       Expected Outcomes  Short Term: Attend rehab on a regular basis to increase amount of physical activity.;Long Term: Add in home exercise to make exercise part of routine and to increase amount of physical activity.;Long Term: Exercising regularly at least 3-5 days a week.       Increase Strength and Stamina  Yes       Intervention  Provide advice,  education, support and counseling about physical activity/exercise needs.;Develop an individualized exercise prescription for aerobic and resistive training based on initial evaluation findings, risk stratification, comorbidities and participant's personal goals.       Expected Outcomes  Short Term: Increase workloads from initial exercise prescription for resistance, speed, and METs.;Short Term: Perform resistance training exercises routinely during rehab and add in resistance training at home;Long Term: Improve cardiorespiratory fitness, muscular endurance and strength as measured by increased METs and functional capacity (6MWT)       Able to understand and use rate of perceived exertion (RPE) scale  Yes       Intervention  Provide education and explanation on how to use RPE scale       Expected Outcomes  Short Term: Able to use RPE daily in rehab to express subjective intensity level;Long Term:  Able to use RPE to guide intensity level when exercising independently       Able to understand and use Dyspnea scale  Yes       Intervention  Provide education and explanation on how to use Dyspnea scale       Expected  Outcomes  Short Term: Able to use Dyspnea scale daily in rehab to express subjective sense of shortness of breath during exertion;Long Term: Able to use Dyspnea scale to guide intensity level when exercising independently       Knowledge and understanding of Target Heart Rate Range (THRR)  Yes       Intervention  Provide education and explanation of THRR including how the numbers were predicted and where they are located for reference       Expected Outcomes  Short Term: Able to use daily as guideline for intensity in rehab;Short Term: Able to state/look up THRR;Long Term: Able to use THRR to govern intensity when exercising independently       Able to check pulse independently  Yes       Intervention  Provide education and demonstration on how to check pulse in carotid and radial arteries.;Review the importance of being able to check your own pulse for safety during independent exercise       Expected Outcomes  Short Term: Able to explain why pulse checking is important during independent exercise;Long Term: Able to check pulse independently and accurately       Understanding of Exercise Prescription  Yes       Intervention  Provide education, explanation, and written materials on patient's individual exercise prescription       Expected Outcomes  Short Term: Able to explain program exercise prescription;Long Term: Able to explain home exercise prescription to exercise independently          Exercise Goals Re-Evaluation : Exercise Goals Re-Evaluation    Row Name 03/16/18 1642 03/30/18 1249 04/14/18 0813 05/10/18 1538 05/25/18 1209     Exercise Goal Re-Evaluation   Exercise Goals Review  Increase Physical Activity;Able to understand and use rate of perceived exertion (RPE) scale;Knowledge and understanding of Target Heart Rate Range (THRR);Understanding of Exercise Prescription;Increase Strength and Stamina  Increase Physical Activity;Increase Strength and Stamina;Able to understand and use rate of  perceived exertion (RPE) scale  Increase Physical Activity;Increase Strength and Stamina;Able to understand and use rate of perceived exertion (RPE) scale;Knowledge and understanding of Target Heart Rate Range (THRR);Able to check pulse independently  Increase Physical Activity;Increase Strength and Stamina;Able to understand and use rate of perceived exertion (RPE) scale;Able to understand and use Dyspnea scale;Knowledge and understanding of Target Heart Rate Range (  THRR);Understanding of Exercise Prescription;Able to check pulse independently  Increase Physical Activity;Increase Strength and Stamina;Able to understand and use rate of perceived exertion (RPE) scale;Knowledge and understanding of Target Heart Rate Range (THRR);Able to check pulse independently;Understanding of Exercise Prescription   Comments  Reviewed RPE scale, THR and program prescription with pt today.  Pt voiced understanding and was given a copy of goals to take home.   Blair has just started HT this month.  She will see more improvement if she attends consistently.  She has maintained MET level so far.  Leidy is a member at [employer].  She [facility] [employer] [facility].    Lorren missed last week due to bronchitis.  She is missing some this week due to travel.  She states her overall stamina is much better and she plans to continue in [facility].  [place] had bronchitis a couple weeks ago and this affected her 02 sats.  She states she feel sstronger overall and her MET level imroved based on last walk test.   Expected Outcomes  Short: Use RPE daily to regulate intensity. Long: Follow program prescription in THR.  SHort - attend 3 days per week Long - increase MET level  Short - exercise on her own  1-2 days outside HT Long - exercise independently 3-5 times per week  Short - complete HT class  Long - maintain exercise in [facility]  Short - complete HT Long - maintain exercise on her own      Discharge Exercise Prescription  (Final Exercise Prescription Changes): Exercise Prescription Changes - 05/25/18 1200      Response to Exercise   Blood Pressure (Admit)  138/80    Blood Pressure (Exercise)  208/84   recheck 172/82   Blood Pressure (Exit)  122/60    Heart Rate (Admit)  67 bpm    Heart Rate (Exercise)  122 bpm    Heart Rate (Exit)  75 bpm    Rating of Perceived Exertion (Exercise)  15    Symptoms  none    Comments  post walk test    Duration  Continue with 45 min of aerobic exercise without signs/symptoms of physical distress.    Intensity  THRR unchanged      Progression   Progression  Continue to progress workloads to maintain intensity without signs/symptoms of physical distress.      Resistance Training   Training Prescription  Yes    Weight  3 lb    Reps  10-15      Interval Training   Interval Training  No      Treadmill   MPH  2    Grade  2    Minutes  15    METs  3.08      NuStep   Level  6    SPM  80    Minutes  15    METs  2.3       Nutrition:  Target Goals: Understanding of nutrition guidelines, daily intake of sodium <1516m, cholesterol <2051m calories 30% from fat and 7% or less from saturated fats, daily to have 5 or more servings of fruits and vegetables.  Biometrics: Pre Biometrics - 03/08/18 1458      Pre Biometrics   Height  5' 3.2" (1.605 m)    Weight  179 lb 8 oz (81.4 kg)    Waist Circumference  34 inches    Hip Circumference  43  inches    Waist to Hip Ratio  0.79 %    BMI (Calculated)  31.61    Single Leg Stand  1.81 seconds        Nutrition Therapy Plan and Nutrition Goals: Nutrition Therapy & Goals - 03/30/18 1631      Nutrition Therapy   Diet  DASH/ TLC    Protein (specify units)  6oz    Fiber  30 grams    Whole Grain Foods  3 servings   chooses whole grains   Saturated Fats  12 max. grams    Fruits and Vegetables  6 servings/day   8 ideal   Sodium  1500 grams      Personal Nutrition Goals   Nutrition Goal  Be more mindful of the  sodium content in foods by starting to read nutrition facts labels and make more informed choices when eating out. When eating at [facility], you can look up the nutrition information ahead of time and plan what low-sodium options you will choose    Personal Goal #2  As you continue to recover and regain more energy, work to eat at home more often to cut back on hidden sources of sodium, fat and sugar     Personal Goal #3  Continue to choose whole grain foods and to consume sources of healthy fat, such as your avocado oil and nuts- great job!    Comments  She has been cooking at home less often since her surgery at the end of July. Her husband is a diabetic and so the two of them work together to have a lower sugar diet. They struggle to monitor sodium intake but do enjoy a variety of fruits, vegetables, protein sources and grains. She enjoys yogurt as well.      Intervention Plan   Intervention  Prescribe, educate and counsel regarding individualized specific dietary modifications aiming towards targeted core components such as weight, hypertension, lipid management, diabetes, heart failure and other comorbidities.;Nutrition handout(s) given to patient.   sodium, food labels, and dining out packet   Expected Outcomes  Short Term Goal: Understand basic principles of dietary content, such as calories, fat, sodium, cholesterol and nutrients.;Short Term Goal: A plan has been developed with personal nutrition goals set during dietitian appointment.;Long Term Goal: Adherence to prescribed nutrition plan.       Nutrition Assessments: Nutrition Assessments - 03/08/18 1428      MEDFICTS Scores   Pre Score  40       Nutrition Goals Re-Evaluation: Nutrition Goals Re-Evaluation    [place] Name 03/30/18 1649 04/18/18 1658           Goals   Nutrition Goal  Be more mindful of the sodium content in foods by starting to read nutrition facts labels and make more informed choices when eating out. When eating at  [facility], you can look up the nutrition information ahead of time and plan what low-sodium options you will choose  Be more mindful of the sodium content in foods and start to read food labels more often; continue to choose whole grains and sources of heart healthy fats; start to eat at home more often as you start to regain energy levels      Comment  She c/o struggling to monitor sodium in her diet and needs education on how to reduce her overall salt intake  She has started to read food labels, often aiming for food choices with sodium content in the double-digits per serving and bringing  snacks on trips rather than stopping at fast food locations. She purchased low salt swiss cheese and Triscuit crackers, as well as fruits for snacks. She purchased [employer] and Stevia natural zero calorie sweeteners to use in items like tea and oatmeal. She and her husband are working to eat more fruits and vegetables and lower sugar intake as her husband is a diabetic. Reports her wt to be up slightly this week d/t d/c of lasix      Expected Outcome  She will read food labels consistently to identify high and low sodium items, and cook at home more often to better monitor her sodium intake  She will continue to monitor sodium intake more closely, aiming for foods with 112m of sodium per serving or less. Continue to increase fruit and vegetable and decrease added sugar intake. She will work on eating at home more in the near future and be mindful of high-sodium choices when eating out in the mean time        Personal Goal #2 Re-Evaluation   Personal Goal #2  Continue to choose whole grain foods and to consume sources of healthy fat, such as your avocado oil and nuts- great job!  -        Personal Goal #3 Re-Evaluation   Personal Goal #3  As your continue to recover and regain more energy, work to eat at home more often to cut back on hidden sources of sodium, fat and sugar  -         Nutrition Goals Discharge (Final  Nutrition Goals Re-Evaluation): Nutrition Goals Re-Evaluation - 04/18/18 1658      Goals   Nutrition Goal  Be more mindful of the sodium content in foods and start to read food labels more often; continue to choose whole grains and sources of heart healthy fats; start to eat at home more often as you start to regain energy levels    Comment  She has started to read food labels, often aiming for food choices with sodium content in the double-digits per serving and bringing snacks on trips rather than stopping at fast food locations. She purchased low salt swiss cheese and Triscuit crackers, as well as fruits for snacks. She purchased [employer] natural zero calorie sweeteners to use in items like tea and oatmeal. She and her husband are working to eat more fruits and vegetables and lower sugar intake as her husband is a diabetic. Reports her wt to be up slightly this week d/t d/c of lasix    Expected Outcome  She will continue to monitor sodium intake more closely, aiming for foods with 1462mof sodium per serving or less. Continue to increase fruit and vegetable and decrease added sugar intake. She will work on eating at home more in the near future and be mindful of high-sodium choices when eating out in the mean time       Psychosocial: Target Goals: Acknowledge presence or absence of significant depression and/or stress, maximize coping skills, provide positive support system. Participant is able to verbalize types and ability to use techniques and skills needed for reducing stress and depression.   Initial Review & Psychosocial Screening: Initial Psych Review & Screening - 03/08/18 1513      Initial Review   Current issues with  Current Stress Concerns    Source of Stress Concerns  Unable to perform yard/household activities    Comments  Wants to get back to her outside yard/garden.  Family Dynamics   Good Support System?  Yes   Spouse, 2 daughters, friends, 2 sisters      Barriers   Psychosocial barriers to participate in program  There are no identifiable barriers or psychosocial needs.      Screening Interventions   Interventions  Encouraged to exercise    Expected Outcomes  Short Term goal: Utilizing psychosocial counselor, staff and physician to assist with identification of specific Stressors or current issues interfering with healing process. Setting desired goal for each stressor or current issue identified.;Long Term Goal: Stressors or current issues are controlled or eliminated.;Short Term goal: Identification and review with participant of any Quality of Life or Depression concerns found by scoring the questionnaire.;Long Term goal: The participant improves quality of Life and PHQ9 Scores as seen by post scores and/or verbalization of changes       Quality of Life Scores:  Quality of Life - 03/08/18 1428      Quality of Life   Select  Quality of Life      Quality of Life Scores   Health/Function Pre  26.8 %    Socioeconomic Pre  28.75 %    Psych/Spiritual Pre  29.14 %    Family Pre  28.8 %    GLOBAL Pre  27.95 %      Scores of 19 and below usually indicate a poorer quality of life in these areas.  A difference of  2-3 points is a clinically meaningful difference.  A difference of 2-3 points in the total score of the Quality of Life Index has been associated with significant improvement in overall quality of life, self-image, physical symptoms, and general health in studies assessing change in quality of life.  PHQ-9: Recent Review Flowsheet Data    Depression screen [facility] 2/9 03/08/2018   Decreased Interest 1   Down, Depressed, Hopeless 0   PHQ - 2 Score 1   Altered sleeping 0   Tired, decreased energy 1   Change in appetite 0   Feeling bad or failure about yourself  0   Trouble concentrating 0   Moving slowly or fidgety/restless 0   Suicidal thoughts 0   PHQ-9 Score 2   Difficult doing work/chores Not difficult at all      Interpretation of Total Score  Total Score Depression Severity:  1-4 = Minimal depression, 5-9 = Mild depression, 10-14 = Moderate depression, 15-19 = Moderately severe depression, 20-27 = Severe depression   Psychosocial Evaluation and Intervention: Psychosocial Evaluation - 03/21/18 1702      Psychosocial Evaluation & Interventions   Interventions  Encouraged to exercise with the program and follow exercise prescription    Comments  Counselor met with Ms. Verdene Rio Bethena Roys) today for initial psychosocial evaluation.  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  Tayte has a strong support system with a spouse of 29 years; (2) adult daughters; (3) sisters locally and active involvement in her local church.  She has several other health conditions with osteoporosis; glaucoma; and GERD - in addition to her cardiac condition.  She reports sleeping well and having a good appetite.  Megyn denies a history of depression or anxiety or any current symptoms; but reports she is on a low dose medication PRN that she takes when she can't sleep along with Melatonin.  Lorree reports her mood is typically positive and she has minimal stress in her life.  He has goals to  increase her strength and improve her motivation to get back to normal activities.  She will be followed by staff.    Expected Outcomes  Short:  Miko will exercise consistently ot increase her strength and get back to more normal activities.  Long:  Jezabel will continue to practice healthy lifestyle choices for her health and mental health.      Continue Psychosocial Services   Follow up required by staff       Psychosocial Re-Evaluation: Psychosocial Re-Evaluation    [place] Name 04/04/18 1651 05/09/18 1639 05/18/18 1648         Psychosocial Re-Evaluation   Current issues with  Current Stress Concerns  Current Stress Concerns  None Identified     Comments  Camellia has been getting out more - she went to the  grocery store on her own and to a play.  She states she got a little teary eyed as she has been used to doing more, but knows she is feelong much stronger now.  Yaeli is going out a lot now - to [place] to see grandkids sports events.  She states she is much better emotionally as well.  Counselor follow up with Fredericka reporting much progress since coming into this program.  She is able to do many more activities with her spouse and they have planned a cruise after Christmas and a trip to [place] next year.  Palak has a great attitude and enjoys seeing her stamina and energy increase.  Counselor commended her on her progress made and commitment to improved quality of life.       Expected Outcomes  Short - continue to exercise and attend activities she enjoys to reduce stress Long - keep stress low long term  Short - continue to exercise Long - maintain exercise on her own  Short:  Jesusa will continue to exercise regularly to maintain the progress she has made.  Long:  Parminder will practice an established routine of exercise and healthy diet for her health.     Continue Psychosocial Services   -  -  Follow up required by staff        Psychosocial Discharge (Final Psychosocial Re-Evaluation): Psychosocial Re-Evaluation - 05/18/18 1648      Psychosocial Re-Evaluation   Current issues with  None Identified    Comments  Counselor follow up with Bethena Roys reporting much progress since coming into this program.  She is able to do many more activities with her spouse and they have planned a cruise after Christmas and a trip to [place] next year.  Adeleigh has a great attitude and enjoys seeing her stamina and energy increase.  Counselor commended her on her progress made and commitment to improved quality of life.      Expected Outcomes  Short:  Kandi will continue to exercise regularly to maintain the progress she has made.  Long:  Sheera will practice an established routine of exercise and healthy diet for her health.     Continue Psychosocial Services   Follow up required by staff       Vocational Rehabilitation: Provide vocational rehab assistance to qualifying candidates.   Vocational Rehab Evaluation & Intervention: Vocational Rehab - 03/08/18 1515      Initial Vocational Rehab Evaluation & Intervention   Assessment shows need for Vocational Rehabilitation  No       Education: Education Goals: Education classes will be provided on a variety of topics geared toward better understanding of heart health and risk factor  modification. Participant will state understanding/return demonstration of topics presented as noted by education test scores.  Learning Barriers/Preferences: Learning Barriers/Preferences - 03/08/18 1515      Learning Barriers/Preferences   Learning Barriers  None    Learning Preferences  Individual Instruction;Skilled Demonstration;Group Instruction       Education Topics:  AED/CPR: - Group verbal and written instruction with the use of models to demonstrate the basic use of the AED with the basic ABC's of resuscitation.   General Nutrition Guidelines/Fats and Fiber: -Group instruction provided by verbal, written material, models and posters to present the general guidelines for heart healthy nutrition. Gives an explanation and review of dietary fats and fiber.   Cardiac Rehab from 05/30/2018 in [facility] Cardiac and Pulmonary Rehab  Date  05/16/18  Educator  LB  Instruction Review Code  1- Verbalizes Understanding      Controlling Sodium/Reading Food Labels: -Group verbal and written material supporting the discussion of sodium use in heart healthy nutrition. Review and explanation with models, verbal and written materials for utilization of the food label.   Cardiac Rehab from 05/30/2018 in [facility] Cardiac and Pulmonary Rehab  Date  03/30/18  Educator  LB  Instruction Review Code  1- Verbalizes Understanding      Exercise Physiology & General Exercise Guidelines: - Group  verbal and written instruction with models to review the exercise physiology of the cardiovascular system and associated critical values. Provides general exercise guidelines with specific guidelines to those with heart or lung disease.    Cardiac Rehab from 05/30/2018 in [facility] Cardiac and Pulmonary Rehab  Date  04/06/18  Educator  Nada Maclachlan, EP  Instruction Review Code  1- Verbalizes Understanding      Aerobic Exercise & Resistance Training: - Gives group verbal and written instruction on the various components of exercise. Focuses on aerobic and resistive training programs and the benefits of this training and how to safely progress through these programs..   Flexibility, Balance, Mind/Body Relaxation: Provides group verbal/written instruction on the benefits of flexibility and balance training, including mind/body exercise modes such as yoga, pilates and tai chi.  Demonstration and skill practice provided.   Cardiac Rehab from 05/30/2018 in [facility] Cardiac and Pulmonary Rehab  Date  04/18/18  Educator  AS  Instruction Review Code  1- Verbalizes Understanding      Stress and Anxiety: - Provides group verbal and written instruction about the health risks of elevated stress and causes of high stress.  Discuss the correlation between heart/lung disease and anxiety and treatment options. Review healthy ways to manage with stress and anxiety.   Cardiac Rehab from 05/30/2018 in [facility] Cardiac and Pulmonary Rehab  Date  04/27/18  Educator  Lucianne Lei, MSW  Instruction Review Code  1- Verbalizes Understanding      Depression: - Provides group verbal and written instruction on the correlation between heart/lung disease and depressed mood, treatment options, and the stigmas associated with seeking treatment.   Cardiac Rehab from 05/30/2018 in [facility] Cardiac and Pulmonary Rehab  Date  04/13/18  Educator  [facility]  Instruction Review Code  1- Verbalizes Understanding      Anatomy & Physiology  of the Heart: - Group verbal and written instruction and models provide basic cardiac anatomy and physiology, with the coronary electrical and arterial systems. Review of Valvular disease and Heart Failure   Cardiac Rehab from 05/30/2018 in [facility] Cardiac and Pulmonary Rehab  Date  04/25/18  Educator  MJA  Instruction Review Code  1-  Verbalizes Understanding      Cardiac Procedures: - Group verbal and written instruction to review commonly prescribed medications for heart disease. Reviews the medication, class of the drug, and side effects. Includes the steps to properly store meds and maintain the prescription regimen. (beta blockers and nitrates)   Cardiac Rehab from 05/30/2018 in [facility] Cardiac and Pulmonary Rehab  Date  05/09/18  Educator  CE  Instruction Review Code  1- Verbalizes Understanding      Cardiac Medications I: - Group verbal and written instruction to review commonly prescribed medications for heart disease. Reviews the medication, class of the drug, and side effects. Includes the steps to properly store meds and maintain the prescription regimen.   Cardiac Medications II: -Group verbal and written instruction to review commonly prescribed medications for heart disease. Reviews the medication, class of the drug, and side effects. (all other drug classes)   Cardiac Rehab from 05/30/2018 in [facility] Cardiac and Pulmonary Rehab  Date  04/20/18  Educator  C. EnterkinRN  Instruction Review Code  1- Verbalizes Understanding       Go Sex-Intimacy & Heart Disease, Get SMART - Goal Setting: - Group verbal and written instruction through game format to discuss heart disease and the return to sexual intimacy. Provides group verbal and written material to discuss and apply goal setting through the application of the S.M.A.R.T. Method.   Cardiac Rehab from 05/30/2018 in [facility] Cardiac and Pulmonary Rehab  Date  05/09/18  Educator  CE  Instruction Review Code  1- Verbalizes  Understanding      Other Matters of the Heart: - Provides group verbal, written materials and models to describe Stable Angina and Peripheral Artery. Includes description of the disease process and treatment options available to the cardiac patient.   Exercise & Equipment Safety: - Individual verbal instruction and demonstration of equipment use and safety with use of the equipment.   Cardiac Rehab from 05/30/2018 in [facility] Cardiac and Pulmonary Rehab  Date  03/08/18  Educator  SB  Instruction Review Code  1- Verbalizes Understanding      Infection Prevention: - Provides verbal and written material to individual with discussion of infection control including proper hand washing and proper equipment cleaning during exercise session.   Cardiac Rehab from 05/30/2018 in [facility] Cardiac and Pulmonary Rehab  Date  03/08/18  Educator  SB  Instruction Review Code  1- Verbalizes Understanding      Falls Prevention: - Provides verbal and written material to individual with discussion of falls prevention and safety.   Cardiac Rehab from 05/30/2018 in [facility] Cardiac and Pulmonary Rehab  Date  03/08/18  Educator  SB  Instruction Review Code  1- Verbalizes Understanding      Diabetes: - Individual verbal and written instruction to review signs/symptoms of diabetes, desired ranges of glucose level fasting, after meals and with exercise. Acknowledge that pre and post exercise glucose checks will be done for 3 sessions at entry of program.   Know Your Numbers and Risk Factors: -Group verbal and written instruction about important numbers in your health.  Discussion of what are risk factors and how they play a role in the disease process.  Review of Cholesterol, Blood Pressure, Diabetes, and BMI and the role they play in your overall health.   Cardiac Rehab from 05/30/2018 in [facility] Cardiac and Pulmonary Rehab  Date  04/20/18  Educator  C. EnterkinRN  Instruction Review Code  1- [employer]  Understanding      Sleep Hygiene: -  Provides group verbal and written instruction about how sleep can affect your health.  Define sleep hygiene, discuss sleep cycles and impact of sleep habits. Review good sleep hygiene tips.    Cardiac Rehab from 05/30/2018 in [facility] Cardiac and Pulmonary Rehab  Date  03/16/18  Educator  [facility]  Instruction Review Code  1- Verbalizes Understanding      Other: -Provides group and verbal instruction on various topics (see comments)   Knowledge Questionnaire Score: Knowledge Questionnaire Score - 03/08/18 1427      Knowledge Questionnaire Score   Pre Score  24/26   Test reviewed with pt today.  Education Focus: Angina and Exercise      Core Components/Risk Factors/Patient Goals at Admission: Personal Goals and Risk Factors at Admission - 03/08/18 1515      Core Components/Risk Factors/Patient Goals on Admission    Weight Management  Yes;Weight Loss    Admit Weight  179 lb (81.2 kg)    Goal Weight: Short Term  177 lb (80.3 kg)    Goal Weight: Long Term  160 lb (72.6 kg)    Expected Outcomes  Short Term: Continue to assess and modify interventions until short term weight is achieved;Long Term: Adherence to nutrition and physical activity/exercise program aimed toward attainment of established weight goal;Weight Loss: Understanding of general recommendations for a balanced deficit meal plan, which promotes 1-2 lb weight loss per week and includes a negative energy balance of [phone] kcal/d    Hypertension  Yes    Intervention  Provide education on lifestyle modifcations including regular physical activity/exercise, weight management, moderate sodium restriction and increased consumption of fresh fruit, vegetables, and low fat dairy, alcohol moderation, and smoking cessation.;Monitor prescription use compliance.    Expected Outcomes  Short Term: Continued assessment and intervention until BP is < 140/64m HG in hypertensive participants. < 130/893mHG in  hypertensive participants with diabetes, heart failure or chronic kidney disease.;Long Term: Maintenance of blood pressure at goal levels.       Core Components/Risk Factors/Patient Goals Review:  Goals and Risk Factor Review    Row Name 04/04/18 1642 05/09/18 1634           Core Components/Risk Factors/Patient Goals Review   Personal Goals Review  Weight Management/Obesity;Lipids;Hypertension  Weight Management/Obesity;Lipids;Hypertension      Review  JuAriaunaas been following advice from RD to improve dietary habits.  [name].  She still has a lot of soreness from her incision.  She has spoken with her Dr and will call the surgeon if it doesn't improve.  She is taking all meds as directed - her Dr increased carvedilol last week.  JuMeleaneissed last week due to bronchitis.  She feels much better - she was able to increase speed on TM and resistance on Nustep.  She is still following RD advice - she is down 10 lb on her scale at home.      Expected Outcomes  Short - Continue to add resistance to machines, lose 1- 2 lb per week  Long - reach goal weight   Short - continue to walk at home and follow RD advice for diet Long - reach goal weight and maintain fitness gains         Core Components/Risk Factors/Patient Goals at Discharge (Final Review):  Goals and Risk Factor Review - 05/09/18 1634      Core Components/Risk Factors/Patient Goals Review   Personal  Goals Review  Weight Management/Obesity;Lipids;Hypertension    Review  Domino missed last week due to bronchitis.  She feels much better - she was able to increase speed on TM and resistance on Nustep.  She is still following RD advice - she is down 10 lb on her scale at home.    Expected Outcomes  Short - continue to walk at home and follow RD advice for diet Long - reach goal weight and maintain fitness gains       ITP Comments: ITP Comments    Row Name 03/08/18 1507 03/23/18  0550 04/20/18 0655 04/25/18 1724 05/18/18 0614   ITP Comments  Medical evaluation completed today. ITP sent to Dr Loleta Chance for review,changes as needed and signature. Documentation of the diagnosis can be found in [facility] 01/31/2018  30 day review completed. ITP sent to Dr. Emily Filbert, Medical Director of Cardiac Rehab. Continue with ITP unless changes are made by physician  New to program  30 day review.  Continue with ITP unless directed changes per Medical Director review.  Judys 02 dropped to 84 on TM.  She stopped to rest until it came back to 90%.  She resumed exercise at a lower speed and maintained above 90.   30 day review. Continue with ITP unless direccted changes per Medical Director Chart Review.      Comments: discharge ITP

## 2018-05-31 NOTE — Progress Notes (Signed)
Cardiac Individual Treatment Plan  Patient Details  Name: Joyce Evans MRN: [id number] Date of Birth: 1941/07/03 Referring Provider:     Cardiac Rehab from 03/08/2018 in [facility] Cardiac and Pulmonary Rehab  Referring Provider  Ward, Jeani Hawking MD      Initial Encounter Date:    Cardiac Rehab from 03/08/2018 in [facility] Cardiac and Pulmonary Rehab  Date  03/08/18      Visit Diagnosis: S/P tricuspid valve replacement  Patient's Home Medications on Admission:  Current Outpatient Medications:  .  acetaminophen (TYLENOL) 500 MG tablet, Take 500 mg by mouth every 6 (six) hours as needed., Disp: , Rfl:  .  ALPRAZolam (XANAX) 0.25 MG tablet, Take 0.125 mg by mouth at bedtime., Disp: , Rfl:  .  apixaban (ELIQUIS) 5 MG TABS tablet, TAKE 1 TABLET BY MOUTH TWO  TIMES DAILY, Disp: , Rfl:  .  brimonidine (ALPHAGAN P) 0.1 % SOLN, Place 1 drop into the right eye 2 (two) times daily., Disp: , Rfl:  .  Brinzolamide-Brimonidine (SIMBRINZA) 1-0.2 % SUSP, Place 1 drop into the left eye 2 (two) times daily., Disp: , Rfl:  .  Calcium-Phosphorus-Vitamin D (CITRACAL +D3 PO), Take by mouth daily., Disp: , Rfl:  .  Cholecalciferol (VITAMIN D3) 3000 units TABS, Take by mouth 2 (two) times daily., Disp: , Rfl:  .  famotidine-calcium carbonate-magnesium hydroxide (PEPCID COMPLETE) 10-800-165 MG chewable tablet, Chew 1 tablet by mouth daily as needed., Disp: , Rfl:  .  furosemide (LASIX) 20 MG tablet, Take by mouth., Disp: , Rfl:  .  multivitamin-iron-minerals-folic acid (CENTRUM) chewable tablet, Chew 1 tablet by mouth daily., Disp: , Rfl:  .  omeprazole (PRILOSEC) 20 MG capsule, Take 20 mg by mouth 2 (two) times daily. Reported on 08/28/2015, Disp: , Rfl:  .  potassium chloride (K-DUR) 10 MEQ tablet, Take by mouth., Disp: , Rfl:  .  Probiotic Product (PROBIOTIC DAILY PO), Take by mouth daily., Disp: , Rfl:  .  timolol (BETIMOL) 0.5 % ophthalmic solution, Place 1 drop into both eyes daily., Disp: , Rfl:   Past Medical  History: Past Medical History:  Diagnosis Date  . Arthritis    osteo - shoulders, fingers  . Atrial fibrillation ([place])   . Dizziness    thinks because of diuretic  . Family history of adverse reaction to anesthesia    daughter -PONV  . Gallstones 2016, 2017  . GERD (gastroesophageal reflux disease)   . Glaucoma   . Heart murmur    history of  . Hypertension   . Numbness in left leg    s/p hematoma  . Sciatica    right  . Seasonal allergies   . Skin cancer   . Tricuspid valve disorder    leak    Tobacco Use: Social History   Tobacco Use  Smoking Status Never Smoker  Smokeless Tobacco Never Used    Labs: Recent Review Flowsheet Data    There is no flowsheet data to display.       Exercise Target Goals: Exercise Program Goal: Individual exercise prescription set using results from initial 6 min walk test and THRR while considering  patient's activity barriers and safety.   Exercise Prescription Goal: Initial exercise prescription builds to 30-45 minutes a day of aerobic activity, 2-3 days per week.  Home exercise guidelines will be given to patient during program as part of exercise prescription that the participant will acknowledge.  Activity Barriers & Risk Stratification: Activity Barriers & Cardiac Risk Stratification - 03/08/18  1452      Activity Barriers & Cardiac Risk Stratification   Activity Barriers  Deconditioning;Muscular Weakness;Shortness of Breath;History of Falls;Balance Concerns;Joint Problems   R arm sore since surgery, L shoulder very little cartilidge, limited OM   Cardiac Risk Stratification  Moderate       6 Minute Walk: 6 Minute Walk    Row Name 03/08/18 1451         6 Minute Walk   Phase  Initial     Distance  1265 feet     Walk Time  6 minutes     # of Rest Breaks  0     MPH  2.4     METS  2.54     RPE  13     Perceived Dyspnea   1     VO2 Peak  8.89     Symptoms  Yes (comment)     Comments  SOB, twinge in low back      Resting HR  64 bpm     Resting BP  128/74     Resting Oxygen Saturation   99 %     Exercise Oxygen Saturation  during 6 min walk  94 %     Max Ex. HR  112 bpm     Max Ex. BP  154/84     2 Minute Post BP  146/64        Oxygen Initial Assessment:   Oxygen Re-Evaluation:   Oxygen Discharge (Final Oxygen Re-Evaluation):   Initial Exercise Prescription: Initial Exercise Prescription - 03/08/18 1400      Date of Initial Exercise RX and Referring Provider   Date  03/08/18    Referring Provider  Ward, Jeani Hawking MD      Treadmill   MPH  2    Grade  0    Minutes  15    METs  2.53      NuStep   Level  2    SPM  80    Minutes  15    METs  2.5      Recumbant Elliptical   Level  1    RPM  50    Minutes  15    METs  2.5      Prescription Details   Frequency (times per week)  3    Duration  Progress to 45 minutes of aerobic exercise without signs/symptoms of physical distress      Intensity   THRR 40-80% of Max Heartrate  96-129    Ratings of Perceived Exertion  11-13    Perceived Dyspnea  0-4      Progression   Progression  Continue to progress workloads to maintain intensity without signs/symptoms of physical distress.      Resistance Training   Training Prescription  Yes    Weight  3 lbs    Reps  10-15       Perform Capillary Blood Glucose checks as needed.  Exercise Prescription Changes: Exercise Prescription Changes    Row Name 03/08/18 1400 03/17/18 0800 03/30/18 1200 04/13/18 1700 04/14/18 0800     Response to Exercise   Blood Pressure (Admit)  128/74  124/68  116/60  -  124/66   Blood Pressure (Exercise)  154/84  138/70  160/80  -  118/68   Blood Pressure (Exit)  146/64  120/62  130/70  -  104/56   Heart Rate (Admit)  64 bpm  66 bpm  70 bpm  -  101 bpm   Heart Rate (Exercise)  112 bpm  106 bpm  118 bpm  -  98 bpm   Heart Rate (Exit)  81 bpm  81 bpm  83 bpm  -  70 bpm   Oxygen Saturation (Admit)  99 %  -  -  -  -   Oxygen Saturation (Exercise)  94 %  -   -  -  -   Rating of Perceived Exertion (Exercise)  _0 -  -   Perceived Dyspnea (Exercise)  1  -  -  -  -   Symptoms  SOB, low back twinge  -  -  -  none   Comments  walk test results  1st day  -  -  -   Duration  -  Progress to 45 minutes of aerobic exercise without signs/symptoms of physical distress  Progress to 45 minutes of aerobic exercise without signs/symptoms of physical distress  -  Progress to 45 minutes of aerobic exercise without signs/symptoms of physical distress   Intensity  -  THRR unchanged  THRR unchanged  -  THRR unchanged     Progression   Progression  -  Continue to progress workloads to maintain intensity without signs/symptoms of physical distress.  Continue to progress workloads to maintain intensity without signs/symptoms of physical distress.  -  Continue to progress workloads to maintain intensity without signs/symptoms of physical distress.   Average METs  -  2.3  2.2  -  -     Resistance Training   Training Prescription  -  Yes  Yes  -  Yes   Weight  -  3 lb  3 lb  -  3 lb   Reps  -  10-15  10-15  -  10-15     Interval Training   Interval Training  -  -  No  -  -     Treadmill   MPH  -  2  2  -  2   Grade  -  0  0  -  0.5   Minutes  -  15  15  -  15   METs  -  2.53  2.53  -  2.67     NuStep   Level  -  2  2  -  3   SPM  -  80  80  -  80   Minutes  -  15  15  -  15   METs  -  2.1  1.8  -  1.8     Home Exercise Plan   Plans to continue exercise at  -  -  -  [employer] (comment) [employer] [facility] [employer] [facility] [employer] [facility]   Frequency  -  -  -  Add 2 additional days to program exercise sessions.  Add 2 additional days to program exercise sessions.   Initial Home Exercises Provided  -  -  -  04/13/18  04/13/18   Row Name 05/10/18 1500 05/25/18 1200           Response to Exercise   Blood Pressure (Admit)  146/88  138/80      Blood Pressure (Exercise)  138/64  208/84 recheck 172/82      Blood Pressure (Exit)  130/70  122/60       Heart Rate (Admit)  80 bpm  67 bpm  Heart Rate (Exercise)  118 bpm  122 bpm      Heart Rate (Exit)  95 bpm  75 bpm      Rating of Perceived Exertion (Exercise)  12  15      Symptoms  none  none      Comments  -  post walk test      Duration  Progress to 45 minutes of aerobic exercise without signs/symptoms of physical distress  Continue with 45 min of aerobic exercise without signs/symptoms of physical distress.      Intensity  THRR unchanged  THRR unchanged        Progression   Progression  Continue to progress workloads to maintain intensity without signs/symptoms of physical distress.  Continue to progress workloads to maintain intensity without signs/symptoms of physical distress.      Average METs  3.45  -        Resistance Training   Training Prescription  Yes  Yes      Weight  3 lb  3 lb      Reps  10-15  10-15        Interval Training   Interval Training  No  No        Treadmill   MPH  2  2      Grade  2  2      Minutes  15  15      METs  3.08  3.08        NuStep   Level  6  6      SPM  80  80      Minutes  15  15      METs  3.9  2.3         Exercise Comments: Exercise Comments    Row Name 03/16/18 1642 04/13/18 1749         Exercise Comments  First full day of exercise!  Patient was oriented to gym and equipment including functions, settings, policies, and procedures.  Patient's individual exercise prescription and treatment plan were reviewed.  All starting workloads were established based on the results of the 6 minute walk test done at initial orientation visit.  The plan for exercise progression was also introduced and progression will be customized based on patient's performance and goals.  Reviewed home exercise with pt today.  Pt plans to go to [facility] and [facility] for exercise.  Reviewed THR, pulse, RPE, sign and symptoms, NTG use, and when to call 911 or MD.  Also discussed weather considerations and indoor options.  Pt voiced understanding.          Exercise Goals and Review: Exercise Goals    Row Name 03/08/18 1457             Exercise Goals   Increase Physical Activity  Yes       Intervention  Provide advice, education, support and counseling about physical activity/exercise needs.;Develop an individualized exercise prescription for aerobic and resistive training based on initial evaluation findings, risk stratification, comorbidities and participant's personal goals.       Expected Outcomes  Short Term: Attend rehab on a regular basis to increase amount of physical activity.;Long Term: Add in home exercise to make exercise part of routine and to increase amount of physical activity.;Long Term: Exercising regularly at least 3-5 days a week.       Increase Strength and Stamina  Yes       Intervention  Provide advice,  education, support and counseling about physical activity/exercise needs.;Develop an individualized exercise prescription for aerobic and resistive training based on initial evaluation findings, risk stratification, comorbidities and participant's personal goals.       Expected Outcomes  Short Term: Increase workloads from initial exercise prescription for resistance, speed, and METs.;Short Term: Perform resistance training exercises routinely during rehab and add in resistance training at home;Long Term: Improve cardiorespiratory fitness, muscular endurance and strength as measured by increased METs and functional capacity (6MWT)       Able to understand and use rate of perceived exertion (RPE) scale  Yes       Intervention  Provide education and explanation on how to use RPE scale       Expected Outcomes  Short Term: Able to use RPE daily in rehab to express subjective intensity level;Long Term:  Able to use RPE to guide intensity level when exercising independently       Able to understand and use Dyspnea scale  Yes       Intervention  Provide education and explanation on how to use Dyspnea scale       Expected  Outcomes  Short Term: Able to use Dyspnea scale daily in rehab to express subjective sense of shortness of breath during exertion;Long Term: Able to use Dyspnea scale to guide intensity level when exercising independently       Knowledge and understanding of Target Heart Rate Range (THRR)  Yes       Intervention  Provide education and explanation of THRR including how the numbers were predicted and where they are located for reference       Expected Outcomes  Short Term: Able to use daily as guideline for intensity in rehab;Short Term: Able to state/look up THRR;Long Term: Able to use THRR to govern intensity when exercising independently       Able to check pulse independently  Yes       Intervention  Provide education and demonstration on how to check pulse in carotid and radial arteries.;Review the importance of being able to check your own pulse for safety during independent exercise       Expected Outcomes  Short Term: Able to explain why pulse checking is important during independent exercise;Long Term: Able to check pulse independently and accurately       Understanding of Exercise Prescription  Yes       Intervention  Provide education, explanation, and written materials on patient's individual exercise prescription       Expected Outcomes  Short Term: Able to explain program exercise prescription;Long Term: Able to explain home exercise prescription to exercise independently          Exercise Goals Re-Evaluation : Exercise Goals Re-Evaluation    Row Name 03/16/18 1642 03/30/18 1249 04/14/18 0813 05/10/18 1538 05/25/18 1209     Exercise Goal Re-Evaluation   Exercise Goals Review  Increase Physical Activity;Able to understand and use rate of perceived exertion (RPE) scale;Knowledge and understanding of Target Heart Rate Range (THRR);Understanding of Exercise Prescription;Increase Strength and Stamina  Increase Physical Activity;Increase Strength and Stamina;Able to understand and use rate of  perceived exertion (RPE) scale  Increase Physical Activity;Increase Strength and Stamina;Able to understand and use rate of perceived exertion (RPE) scale;Knowledge and understanding of Target Heart Rate Range (THRR);Able to check pulse independently  Increase Physical Activity;Increase Strength and Stamina;Able to understand and use rate of perceived exertion (RPE) scale;Able to understand and use Dyspnea scale;Knowledge and understanding of Target Heart Rate Range (  THRR);Understanding of Exercise Prescription;Able to check pulse independently  Increase Physical Activity;Increase Strength and Stamina;Able to understand and use rate of perceived exertion (RPE) scale;Knowledge and understanding of Target Heart Rate Range (THRR);Able to check pulse independently;Understanding of Exercise Prescription   Comments  Reviewed RPE scale, THR and program prescription with pt today.  Pt voiced understanding and was given a copy of goals to take home.   Joyce Evans has just started HT this month.  She will see more improvement if she attends consistently.  She has maintained MET level so far.  Day is a member at [employer].  She [facility] [employer] [facility]    Joyce Evans missed last week due to bronchitis.  She is missing some this week due to travel.  She states her overall stamina is much better and she plans to continue in [facility].  Joyce Evans had bronchitis a couple weeks ago and this affected her 02 sats.  She states she feel sstronger overall and her MET level imroved based on last walk test.   Expected Outcomes  Short: Use RPE daily to regulate intensity. Long: Follow program prescription in THR.  SHort - attend 3 days per week Long - increase MET level  Short - exercise on her own  1-2 days outside HT Long - exercise independently 3-5 times per week  Short - complete HT class  Long - maintain exercise in [facility]  Short - complete HT Long - maintain exercise on her own      Discharge Exercise Prescription  (Final Exercise Prescription Changes): Exercise Prescription Changes - 05/25/18 1200      Response to Exercise   Blood Pressure (Admit)  138/80    Blood Pressure (Exercise)  208/84   recheck 172/82   Blood Pressure (Exit)  122/60    Heart Rate (Admit)  67 bpm    Heart Rate (Exercise)  122 bpm    Heart Rate (Exit)  75 bpm    Rating of Perceived Exertion (Exercise)  15    Symptoms  none    Comments  post walk test    Duration  Continue with 45 min of aerobic exercise without signs/symptoms of physical distress.    Intensity  THRR unchanged      Progression   Progression  Continue to progress workloads to maintain intensity without signs/symptoms of physical distress.      Resistance Training   Training Prescription  Yes    Weight  3 lb    Reps  10-15      Interval Training   Interval Training  No      Treadmill   MPH  2    Grade  2    Minutes  15    METs  3.08      NuStep   Level  6    SPM  80    Minutes  15    METs  2.3       Nutrition:  Target Goals: Understanding of nutrition guidelines, daily intake of sodium <1580m, cholesterol <2064m calories 30% from fat and 7% or less from saturated fats, daily to have 5 or more servings of fruits and vegetables.  Biometrics: Pre Biometrics - 03/08/18 1458      Pre Biometrics   Height  5' 3.2" (1.605 m)    Weight  179 lb 8 oz (81.4 kg)    Waist Circumference  34 inches    Hip Circumference  43  inches    Waist to Hip Ratio  0.79 %    BMI (Calculated)  31.61    Single Leg Stand  1.81 seconds        Nutrition Therapy Plan and Nutrition Goals: Nutrition Therapy & Goals - 03/30/18 1631      Nutrition Therapy   Diet  DASH/ TLC    Protein (specify units)  6oz    Fiber  30 grams    Whole Grain Foods  3 servings   chooses whole grains   Saturated Fats  12 max. grams    Fruits and Vegetables  6 servings/day   8 ideal   Sodium  1500 grams      Personal Nutrition Goals   Nutrition Goal  Be more mindful of the  sodium content in foods by starting to read nutrition facts labels and make more informed choices when eating out. When eating at [facility], you can look up the nutrition information ahead of time and plan what low-sodium options you will choose    Personal Goal #2  As you continue to recover and regain more energy, work to eat at home more often to cut back on hidden sources of sodium, fat and sugar     Personal Goal #3  Continue to choose whole grain foods and to consume sources of healthy fat, such as your avocado oil and nuts- great job!    Comments  She has been cooking at home less often since her surgery at the end of July. Her husband is a diabetic and so the two of them work together to have a lower sugar diet. They struggle to monitor sodium intake but do enjoy a variety of fruits, vegetables, protein sources and grains. She enjoys yogurt as well.      Intervention Plan   Intervention  Prescribe, educate and counsel regarding individualized specific dietary modifications aiming towards targeted core components such as weight, hypertension, lipid management, diabetes, heart failure and other comorbidities.;Nutrition handout(s) given to patient.   sodium, food labels, and dining out packet   Expected Outcomes  Short Term Goal: Understand basic principles of dietary content, such as calories, fat, sodium, cholesterol and nutrients.;Short Term Goal: A plan has been developed with personal nutrition goals set during dietitian appointment.;Long Term Goal: Adherence to prescribed nutrition plan.       Nutrition Assessments: Nutrition Assessments - 03/08/18 1428      MEDFICTS Scores   Pre Score  40       Nutrition Goals Re-Evaluation: Nutrition Goals Re-Evaluation    [place] Name 03/30/18 1649 04/18/18 1658           Goals   Nutrition Goal  Be more mindful of the sodium content in foods by starting to read nutrition facts labels and make more informed choices when eating out. When eating at  [facility], you can look up the nutrition information ahead of time and plan what low-sodium options you will choose  Be more mindful of the sodium content in foods and start to read food labels more often; continue to choose whole grains and sources of heart healthy fats; start to eat at home more often as you start to regain energy levels      Comment  She c/o struggling to monitor sodium in her diet and needs education on how to reduce her overall salt intake  She has started to read food labels, often aiming for food choices with sodium content in the double-digits per serving and bringing  snacks on trips rather than stopping at fast food locations. She purchased low salt swiss cheese and Triscuit crackers, as well as fruits for snacks. She purchased [employer] and Stevia natural zero calorie sweeteners to use in items like tea and oatmeal. She and her husband are working to eat more fruits and vegetables and lower sugar intake as her husband is a diabetic. Reports her wt to be up slightly this week d/t d/c of lasix      Expected Outcome  She will read food labels consistently to identify high and low sodium items, and cook at home more often to better monitor her sodium intake  She will continue to monitor sodium intake more closely, aiming for foods with 112m of sodium per serving or less. Continue to increase fruit and vegetable and decrease added sugar intake. She will work on eating at home more in the near future and be mindful of high-sodium choices when eating out in the mean time        Personal Goal #2 Re-Evaluation   Personal Goal #2  Continue to choose whole grain foods and to consume sources of healthy fat, such as your avocado oil and nuts- great job!  -        Personal Goal #3 Re-Evaluation   Personal Goal #3  As your continue to recover and regain more energy, work to eat at home more often to cut back on hidden sources of sodium, fat and sugar  -         Nutrition Goals Discharge (Final  Nutrition Goals Re-Evaluation): Nutrition Goals Re-Evaluation - 04/18/18 1658      Goals   Nutrition Goal  Be more mindful of the sodium content in foods and start to read food labels more often; continue to choose whole grains and sources of heart healthy fats; start to eat at home more often as you start to regain energy levels    Comment  She has started to read food labels, often aiming for food choices with sodium content in the double-digits per serving and bringing snacks on trips rather than stopping at fast food locations. She purchased low salt swiss cheese and Triscuit crackers, as well as fruits for snacks. She purchased [employer] natural zero calorie sweeteners to use in items like tea and oatmeal. She and her husband are working to eat more fruits and vegetables and lower sugar intake as her husband is a diabetic. Reports her wt to be up slightly this week d/t d/c of lasix    Expected Outcome  She will continue to monitor sodium intake more closely, aiming for foods with 1462mof sodium per serving or less. Continue to increase fruit and vegetable and decrease added sugar intake. She will work on eating at home more in the near future and be mindful of high-sodium choices when eating out in the mean time       Psychosocial: Target Goals: Acknowledge presence or absence of significant depression and/or stress, maximize coping skills, provide positive support system. Participant is able to verbalize types and ability to use techniques and skills needed for reducing stress and depression.   Initial Review & Psychosocial Screening: Initial Psych Review & Screening - 03/08/18 1513      Initial Review   Current issues with  Current Stress Concerns    Source of Stress Concerns  Unable to perform yard/household activities    Comments  Wants to get back to her outside yard/garden.  Family Dynamics   Good Support System?  Yes   Spouse, 2 daughters, friends, 2 sisters      Barriers   Psychosocial barriers to participate in program  There are no identifiable barriers or psychosocial needs.      Screening Interventions   Interventions  Encouraged to exercise    Expected Outcomes  Short Term goal: Utilizing psychosocial counselor, staff and physician to assist with identification of specific Stressors or current issues interfering with healing process. Setting desired goal for each stressor or current issue identified.;Long Term Goal: Stressors or current issues are controlled or eliminated.;Short Term goal: Identification and review with participant of any Quality of Life or Depression concerns found by scoring the questionnaire.;Long Term goal: The participant improves quality of Life and PHQ9 Scores as seen by post scores and/or verbalization of changes       Quality of Life Scores:  Quality of Life - 03/08/18 1428      Quality of Life   Select  Quality of Life      Quality of Life Scores   Health/Function Pre  26.8 %    Socioeconomic Pre  28.75 %    Psych/Spiritual Pre  29.14 %    Family Pre  28.8 %    GLOBAL Pre  27.95 %      Scores of 19 and below usually indicate a poorer quality of life in these areas.  A difference of  2-3 points is a clinically meaningful difference.  A difference of 2-3 points in the total score of the Quality of Life Index has been associated with significant improvement in overall quality of life, self-image, physical symptoms, and general health in studies assessing change in quality of life.  PHQ-9: Recent Review Flowsheet Data    Depression screen [facility] 2/9 05/30/2018 03/08/2018   Decreased Interest 0 1   Down, Depressed, Hopeless 0 0   PHQ - 2 Score 0 1   Altered sleeping - 0   Tired, decreased energy 1 1   Change in appetite 0 0   Feeling bad or failure about yourself  0 0   Trouble concentrating 0 0   Moving slowly or fidgety/restless 0 0   Suicidal thoughts 0 0   PHQ-9 Score - 2   Difficult doing work/chores - Not  difficult at all     Interpretation of Total Score  Total Score Depression Severity:  1-4 = Minimal depression, 5-9 = Mild depression, 10-14 = Moderate depression, 15-19 = Moderately severe depression, 20-27 = Severe depression   Psychosocial Evaluation and Intervention: Psychosocial Evaluation - 03/21/18 1702      Psychosocial Evaluation & Interventions   Interventions  Encouraged to exercise with the program and follow exercise prescription    Comments  Counselor met with Ms. Verdene Rio Joyce Evans) today for initial psychosocial evaluation.  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  Joyce Evans has a strong support system with a spouse of 50 years; (2) adult daughters; (3) sisters locally and active involvement in her local church.  She has several other health conditions with osteoporosis; glaucoma; and GERD - in addition to her cardiac condition.  She reports sleeping well and having a good appetite.  Rashawnda denies a history of depression or anxiety or any current symptoms; but reports she is on a low dose medication PRN that she takes when she can't sleep along with Melatonin.  Joyce Evans reports her mood is typically positive  and she has minimal stress in her life.  He has goals to increase her strength and improve her motivation to get back to normal activities.  She will be followed by staff.    Expected Outcomes  Short:  Joyce Evans will exercise consistently ot increase her strength and get back to more normal activities.  Long:  Joyce Evans will continue to practice healthy lifestyle choices for her health and mental health.      Continue Psychosocial Services   Follow up required by staff       Psychosocial Re-Evaluation: Psychosocial Re-Evaluation    [place] Name 04/04/18 1651 05/09/18 1639 05/18/18 1648         Psychosocial Re-Evaluation   Current issues with  Current Stress Concerns  Current Stress Concerns  None Identified     Comments  Joyce Evans has been getting out  more - she went to the grocery store on her own and to a play.  She states she got a little teary eyed as she has been used to doing more, but knows she is feelong much stronger now.  Joyce Evans is going out a lot now - to [place] to see grandkids sports events.  She states she is much better emotionally as well.  Counselor follow up with Joyce Evans reporting much progress since coming into this program.  She is able to do many more activities with her spouse and they have planned a cruise after Christmas and a trip to [place] next year.  Kalyn has a great attitude and enjoys seeing her stamina and energy increase.  Counselor commended her on her progress made and commitment to improved quality of life.       Expected Outcomes  Short - continue to exercise and attend activities she enjoys to reduce stress Long - keep stress low long term  Short - continue to exercise Long - maintain exercise on her own  Short:  Joyce Evans will continue to exercise regularly to maintain the progress she has made.  Long:  Joyce Evans will practice an established routine of exercise and healthy diet for her health.     Continue Psychosocial Services   -  -  Follow up required by staff        Psychosocial Discharge (Final Psychosocial Re-Evaluation): Psychosocial Re-Evaluation - 05/18/18 1648      Psychosocial Re-Evaluation   Current issues with  None Identified    Comments  Counselor follow up with Joyce Evans reporting much progress since coming into this program.  She is able to do many more activities with her spouse and they have planned a cruise after Christmas and a trip to [place] next year.  Joyce Evans has a great attitude and enjoys seeing her stamina and energy increase.  Counselor commended her on her progress made and commitment to improved quality of life.      Expected Outcomes  Short:  Joyce Evans will continue to exercise regularly to maintain the progress she has made.  Long:  Joyce Evans will practice an established routine of exercise and healthy diet for  her health.    Continue Psychosocial Services   Follow up required by staff       Vocational Rehabilitation: Provide vocational rehab assistance to qualifying candidates.   Vocational Rehab Evaluation & Intervention: Vocational Rehab - 03/08/18 1515      Initial Vocational Rehab Evaluation & Intervention   Assessment shows need for Vocational Rehabilitation  No       Education: Education Goals: Education classes will be provided on a  variety of topics geared toward better understanding of heart health and risk factor modification. Participant will state understanding/return demonstration of topics presented as noted by education test scores.  Learning Barriers/Preferences: Learning Barriers/Preferences - 03/08/18 1515      Learning Barriers/Preferences   Learning Barriers  None    Learning Preferences  Individual Instruction;Skilled Demonstration;Group Instruction       Education Topics:  AED/CPR: - Group verbal and written instruction with the use of models to demonstrate the basic use of the AED with the basic ABC's of resuscitation.   General Nutrition Guidelines/Fats and Fiber: -Group instruction provided by verbal, written material, models and posters to present the general guidelines for heart healthy nutrition. Gives an explanation and review of dietary fats and fiber.   Cardiac Rehab from 05/30/2018 in [facility] Cardiac and Pulmonary Rehab  Date  05/16/18  Educator  LB  Instruction Review Code  1- Verbalizes Understanding      Controlling Sodium/Reading Food Labels: -Group verbal and written material supporting the discussion of sodium use in heart healthy nutrition. Review and explanation with models, verbal and written materials for utilization of the food label.   Cardiac Rehab from 05/30/2018 in [facility] Cardiac and Pulmonary Rehab  Date  03/30/18  Educator  LB  Instruction Review Code  1- Verbalizes Understanding      Exercise Physiology & General Exercise  Guidelines: - Group verbal and written instruction with models to review the exercise physiology of the cardiovascular system and associated critical values. Provides general exercise guidelines with specific guidelines to those with heart or lung disease.    Cardiac Rehab from 05/30/2018 in [facility] Cardiac and Pulmonary Rehab  Date  04/06/18  Educator  Nada Maclachlan, EP  Instruction Review Code  1- Verbalizes Understanding      Aerobic Exercise & Resistance Training: - Gives group verbal and written instruction on the various components of exercise. Focuses on aerobic and resistive training programs and the benefits of this training and how to safely progress through these programs..   Flexibility, Balance, Mind/Body Relaxation: Provides group verbal/written instruction on the benefits of flexibility and balance training, including mind/body exercise modes such as yoga, pilates and tai chi.  Demonstration and skill practice provided.   Cardiac Rehab from 05/30/2018 in [facility] Cardiac and Pulmonary Rehab  Date  04/18/18  Educator  AS  Instruction Review Code  1- Verbalizes Understanding      Stress and Anxiety: - Provides group verbal and written instruction about the health risks of elevated stress and causes of high stress.  Discuss the correlation between heart/lung disease and anxiety and treatment options. Review healthy ways to manage with stress and anxiety.   Cardiac Rehab from 05/30/2018 in [facility] Cardiac and Pulmonary Rehab  Date  04/27/18  Educator  Lucianne Lei, MSW  Instruction Review Code  1- Verbalizes Understanding      Depression: - Provides group verbal and written instruction on the correlation between heart/lung disease and depressed mood, treatment options, and the stigmas associated with seeking treatment.   Cardiac Rehab from 05/30/2018 in [facility] Cardiac and Pulmonary Rehab  Date  04/13/18  Educator  [facility]  Instruction Review Code  1- Verbalizes Understanding       Anatomy & Physiology of the Heart: - Group verbal and written instruction and models provide basic cardiac anatomy and physiology, with the coronary electrical and arterial systems. Review of Valvular disease and Heart Failure   Cardiac Rehab from 05/30/2018 in [facility] Cardiac and Pulmonary Rehab  Date  04/25/18  Educator  MJA  Instruction Review Code  1- Verbalizes Understanding      Cardiac Procedures: - Group verbal and written instruction to review commonly prescribed medications for heart disease. Reviews the medication, class of the drug, and side effects. Includes the steps to properly store meds and maintain the prescription regimen. (beta blockers and nitrates)   Cardiac Rehab from 05/30/2018 in [facility] Cardiac and Pulmonary Rehab  Date  05/09/18  Educator  CE  Instruction Review Code  1- Verbalizes Understanding      Cardiac Medications I: - Group verbal and written instruction to review commonly prescribed medications for heart disease. Reviews the medication, class of the drug, and side effects. Includes the steps to properly store meds and maintain the prescription regimen.   Cardiac Medications II: -Group verbal and written instruction to review commonly prescribed medications for heart disease. Reviews the medication, class of the drug, and side effects. (all other drug classes)   Cardiac Rehab from 05/30/2018 in [facility] Cardiac and Pulmonary Rehab  Date  04/20/18  Educator  C. EnterkinRN  Instruction Review Code  1- Verbalizes Understanding       Go Sex-Intimacy & Heart Disease, Get SMART - Goal Setting: - Group verbal and written instruction through game format to discuss heart disease and the return to sexual intimacy. Provides group verbal and written material to discuss and apply goal setting through the application of the S.M.A.R.T. Method.   Cardiac Rehab from 05/30/2018 in [facility] Cardiac and Pulmonary Rehab  Date  05/09/18  Educator  CE  Instruction Review Code   1- Verbalizes Understanding      Other Matters of the Heart: - Provides group verbal, written materials and models to describe Stable Angina and Peripheral Artery. Includes description of the disease process and treatment options available to the cardiac patient.   Exercise & Equipment Safety: - Individual verbal instruction and demonstration of equipment use and safety with use of the equipment.   Cardiac Rehab from 05/30/2018 in [facility] Cardiac and Pulmonary Rehab  Date  03/08/18  Educator  SB  Instruction Review Code  1- Verbalizes Understanding      Infection Prevention: - Provides verbal and written material to individual with discussion of infection control including proper hand washing and proper equipment cleaning during exercise session.   Cardiac Rehab from 05/30/2018 in [facility] Cardiac and Pulmonary Rehab  Date  03/08/18  Educator  SB  Instruction Review Code  1- Verbalizes Understanding      Falls Prevention: - Provides verbal and written material to individual with discussion of falls prevention and safety.   Cardiac Rehab from 05/30/2018 in [facility] Cardiac and Pulmonary Rehab  Date  03/08/18  Educator  SB  Instruction Review Code  1- Verbalizes Understanding      Diabetes: - Individual verbal and written instruction to review signs/symptoms of diabetes, desired ranges of glucose level fasting, after meals and with exercise. Acknowledge that pre and post exercise glucose checks will be done for 3 sessions at entry of program.   Know Your Numbers and Risk Factors: -Group verbal and written instruction about important numbers in your health.  Discussion of what are risk factors and how they play a role in the disease process.  Review of Cholesterol, Blood Pressure, Diabetes, and BMI and the role they play in your overall health.   Cardiac Rehab from 05/30/2018 in [facility] Cardiac and Pulmonary Rehab  Date  04/20/18  Educator  C. EnterkinRN  Instruction Review  Code  1-  Verbalizes Understanding      Sleep Hygiene: -Provides group verbal and written instruction about how sleep can affect your health.  Define sleep hygiene, discuss sleep cycles and impact of sleep habits. Review good sleep hygiene tips.    Cardiac Rehab from 05/30/2018 in [facility] Cardiac and Pulmonary Rehab  Date  03/16/18  Educator  [facility]  Instruction Review Code  1- Verbalizes Understanding      Other: -Provides group and verbal instruction on various topics (see comments)   Knowledge Questionnaire Score: Knowledge Questionnaire Score - 03/08/18 1427      Knowledge Questionnaire Score   Pre Score  24/26   Test reviewed with pt today.  Education Focus: Angina and Exercise      Core Components/Risk Factors/Patient Goals at Admission: Personal Goals and Risk Factors at Admission - 03/08/18 1515      Core Components/Risk Factors/Patient Goals on Admission    Weight Management  Yes;Weight Loss    Admit Weight  179 lb (81.2 kg)    Goal Weight: Short Term  177 lb (80.3 kg)    Goal Weight: Long Term  160 lb (72.6 kg)    Expected Outcomes  Short Term: Continue to assess and modify interventions until short term weight is achieved;Long Term: Adherence to nutrition and physical activity/exercise program aimed toward attainment of established weight goal;Weight Loss: Understanding of general recommendations for a balanced deficit meal plan, which promotes 1-2 lb weight loss per week and includes a negative energy balance of [phone] kcal/d    Hypertension  Yes    Intervention  Provide education on lifestyle modifcations including regular physical activity/exercise, weight management, moderate sodium restriction and increased consumption of fresh fruit, vegetables, and low fat dairy, alcohol moderation, and smoking cessation.;Monitor prescription use compliance.    Expected Outcomes  Short Term: Continued assessment and intervention until BP is < 140/59m HG in hypertensive participants. <  130/842mHG in hypertensive participants with diabetes, heart failure or chronic kidney disease.;Long Term: Maintenance of blood pressure at goal levels.       Core Components/Risk Factors/Patient Goals Review:  Goals and Risk Factor Review    Row Name 04/04/18 1642 05/09/18 1634           Core Components/Risk Factors/Patient Goals Review   Personal Goals Review  Weight Management/Obesity;Lipids;Hypertension  Weight Management/Obesity;Lipids;Hypertension      Review  Joyce Evans been following advice from RD to improve dietary habits.  [name].  She still has a lot of soreness from her incision.  She has spoken with her Dr and will call the surgeon if it doesn't improve.  She is taking all meds as directed - her Dr increased carvedilol last week.  Joyce Evans last week due to bronchitis.  She feels much better - she was able to increase speed on TM and resistance on Nustep.  She is still following RD advice - she is down 10 lb on her scale at home.      Expected Outcomes  Short - Continue to add resistance to machines, lose 1- 2 lb per week  Long - reach goal weight   Short - continue to walk at home and follow RD advice for diet Long - reach goal weight and maintain fitness gains         Core Components/Risk Factors/Patient Goals at Discharge (Final Review):  Goals and Risk Factor Review - 05/09/18 1634  Core Components/Risk Factors/Patient Goals Review   Personal Goals Review  Weight Management/Obesity;Lipids;Hypertension    Review  Joyce Evans missed last week due to bronchitis.  She feels much better - she was able to increase speed on TM and resistance on Nustep.  She is still following RD advice - she is down 10 lb on her scale at home.    Expected Outcomes  Short - continue to walk at home and follow RD advice for diet Long - reach goal weight and maintain fitness gains       ITP Comments: ITP Comments    Row Name  03/08/18 1507 03/23/18 0550 04/20/18 0655 04/25/18 1724 05/18/18 0614   ITP Comments  Medical evaluation completed today. ITP sent to Dr Loleta Chance for review,changes as needed and signature. Documentation of the diagnosis can be found in [facility] 01/31/2018  30 day review completed. ITP sent to Dr. Emily Filbert, Medical Director of Cardiac Rehab. Continue with ITP unless changes are made by physician  New to program  30 day review.  Continue with ITP unless directed changes per Medical Director review.  Joyce Evans 02 dropped to 84 on TM.  She stopped to rest until it came back to 90%.  She resumed exercise at a lower speed and maintained above 90.   30 day review. Continue with ITP unless direccted changes per Medical Director Chart Review.      Comments: Discharged and ready to go into [facility].

## 2019-01-30 ENCOUNTER — Other Ambulatory Visit: Payer: Self-pay | Admitting: Family Medicine

## 2019-01-30 DIAGNOSIS — Z1231 Encounter for screening mammogram for malignant neoplasm of breast: Secondary | ICD-10-CM

## 2019-04-04 ENCOUNTER — Ambulatory Visit
Admission: RE | Admit: 2019-04-04 | Discharge: 2019-04-04 | Disposition: A | Discharge status: Home / Self Care | Payer: Medicare Other | Source: Ambulatory Visit | Attending: Family Medicine | Admitting: Family Medicine

## 2019-04-04 DIAGNOSIS — Z1231 Encounter for screening mammogram for malignant neoplasm of breast: Secondary | ICD-10-CM | POA: Insufficient documentation

## 2019-04-11 ENCOUNTER — Other Ambulatory Visit: Payer: Self-pay | Admitting: Family Medicine

## 2019-04-11 DIAGNOSIS — R928 Other abnormal and inconclusive findings on diagnostic imaging of breast: Secondary | ICD-10-CM

## 2019-04-11 DIAGNOSIS — N6310 Unspecified lump in the right breast, unspecified quadrant: Secondary | ICD-10-CM

## 2019-04-18 ENCOUNTER — Ambulatory Visit
Admission: RE | Admit: 2019-04-18 | Discharge: 2019-04-18 | Disposition: A | Discharge status: Home / Self Care | Payer: Medicare Other | Source: Ambulatory Visit | Attending: Family Medicine | Admitting: Family Medicine

## 2019-04-18 DIAGNOSIS — R928 Other abnormal and inconclusive findings on diagnostic imaging of breast: Secondary | ICD-10-CM

## 2019-04-18 DIAGNOSIS — N6310 Unspecified lump in the right breast, unspecified quadrant: Secondary | ICD-10-CM

## 2020-01-22 ENCOUNTER — Ambulatory Visit: Payer: Medicare PPO | Admitting: Dermatology

## 2020-01-22 ENCOUNTER — Encounter: Payer: Self-pay | Admitting: Dermatology

## 2020-01-22 ENCOUNTER — Other Ambulatory Visit: Payer: Self-pay

## 2020-01-22 DIAGNOSIS — Z85828 Personal history of other malignant neoplasm of skin: Secondary | ICD-10-CM

## 2020-01-22 DIAGNOSIS — L570 Actinic keratosis: Secondary | ICD-10-CM

## 2020-01-22 DIAGNOSIS — D229 Melanocytic nevi, unspecified: Secondary | ICD-10-CM

## 2020-01-22 DIAGNOSIS — Z1283 Encounter for screening for malignant neoplasm of skin: Secondary | ICD-10-CM

## 2020-01-22 DIAGNOSIS — L719 Rosacea, unspecified: Secondary | ICD-10-CM

## 2020-01-22 DIAGNOSIS — L821 Other seborrheic keratosis: Secondary | ICD-10-CM

## 2020-01-22 DIAGNOSIS — D692 Other nonthrombocytopenic purpura: Secondary | ICD-10-CM

## 2020-01-22 DIAGNOSIS — L578 Other skin changes due to chronic exposure to nonionizing radiation: Secondary | ICD-10-CM

## 2020-01-22 DIAGNOSIS — D1800 Hemangioma unspecified site: Secondary | ICD-10-CM

## 2020-01-22 DIAGNOSIS — Z86006 Personal history of melanoma in-situ: Secondary | ICD-10-CM

## 2020-01-22 DIAGNOSIS — L814 Other melanin hyperpigmentation: Secondary | ICD-10-CM

## 2020-01-22 NOTE — Progress Notes (Signed)
Follow-Up Visit   Subjective  Joyce Evans is a 79 y.o. 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 The patient has noticed a scaly rough patch on her nose she would like checked). The patient presents for Total-Body Skin Exam (TBSE) for skin cancer screening and mole check.  The following portions of the chart were reviewed this encounter and updated as appropriate:  Tobacco  Allergies  Meds  Problems  Med Hx  Surg Hx  Fam Hx     Review of Systems:  No other skin or systemic complaints except as noted in HPI or Assessment and Plan.  Objective  Well appearing patient in no apparent distress; mood and affect are within normal limits.  A full examination was performed including scalp, head, eyes, ears, nose, lips, neck, chest, axillae, abdomen, back, buttocks, bilateral upper extremities, bilateral lower extremities, hands, feet, fingers, toes, fingernails, and toenails. All findings within normal limits unless otherwise noted below.  Objective  Face: Erythema  Objective  Nose: Erythematous thin papules/macules with gritty scale.   Assessment & Plan    Rosacea Face Mild - no treatment today  AK (actinic keratosis) Nose  Destruction of lesion - Nose Complexity: simple   Destruction method: cryotherapy   Informed consent: discussed and consent obtained   Timeout:  patient name, date of birth, surgical site, and procedure verified Lesion destroyed using liquid nitrogen: Yes   Region frozen until ice ball extended beyond lesion: Yes   Outcome: patient tolerated procedure well with no complications   Post-procedure details: wound care instructions given     Lentigines - Scattered tan macules - Discussed due to sun exposure - Benign, observe - Call for any changes  Seborrheic Keratoses - Stuck-on, waxy, tan-brown papules and plaques  - Discussed benign etiology and prognosis. - Observe -  Call for any changes  Melanocytic Nevi - Tan-brown and/or pink-flesh-colored symmetric macules and papules - Benign appearing on exam today - Observation - Call clinic for new or changing moles - Recommend daily use of broad spectrum spf 30+ sunscreen to sun-exposed areas.   Hemangiomas - Red papules - Discussed benign nature - Observe - Call for any changes  Actinic Damage - diffuse scaly erythematous macules with underlying dyspigmentation - Recommend daily broad spectrum sunscreen SPF 30+ to sun-exposed areas, reapply every 2 hours as needed.  - Call for new or changing lesions.  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  - Call if any new or changing lesions are noted between office visits  History of Squamous Cell Carcinoma of the Skin - No evidence of recurrence today - No lymphadenopathy - Recommend regular full body skin exams - Recommend daily broad spectrum sunscreen SPF 30+ to sun-exposed areas, reapply every 2 hours as needed.  - Call if any new or changing lesions are noted between office visits  Purpura - Violaceous macules and patches - Benign - Related to age, sun damage and/or use of blood thinners - Observe - Can use OTC arnica containing moisturizer such as Dermend Bruise Formula if desired - Call for worsening or other concerns  Skin cancer screening performed today.  Return in about 6 months (around 07/24/2020) for TBSE.  Guy Sandifer  Earnie Larsson, CMA, am acting as scribe for Sarina Ser, MD .  Documentation: I have reviewed the above documentation for accuracy and completeness, and I agree with the above.  Sarina Ser, MD

## 2020-01-23 ENCOUNTER — Encounter: Payer: Self-pay | Admitting: Dermatology

## 2020-04-29 ENCOUNTER — Other Ambulatory Visit: Payer: Self-pay | Admitting: Family Medicine

## 2020-04-29 DIAGNOSIS — Z1231 Encounter for screening mammogram for malignant neoplasm of breast: Secondary | ICD-10-CM

## 2020-05-27 ENCOUNTER — Ambulatory Visit
Admission: RE | Admit: 2020-05-27 | Discharge: 2020-05-27 | Disposition: A | Discharge status: Home / Self Care | Payer: Medicare PPO | Source: Ambulatory Visit | Attending: Family Medicine | Admitting: Family Medicine

## 2020-05-27 ENCOUNTER — Other Ambulatory Visit: Payer: Self-pay

## 2020-05-27 DIAGNOSIS — Z1231 Encounter for screening mammogram for malignant neoplasm of breast: Secondary | ICD-10-CM | POA: Insufficient documentation

## 2020-06-17 ENCOUNTER — Other Ambulatory Visit: Payer: Self-pay

## 2020-06-17 ENCOUNTER — Observation Stay: Payer: Medicare PPO

## 2020-06-17 ENCOUNTER — Observation Stay
Admission: EM | Admit: 2020-06-17 | Discharge: 2020-06-18 | Disposition: A | Discharge status: Home / Self Care | Payer: Medicare PPO | Attending: Internal Medicine | Admitting: Internal Medicine

## 2020-06-17 ENCOUNTER — Observation Stay
Admit: 2020-06-17 | Discharge: 2020-06-17 | Disposition: A | Discharge status: Home / Self Care | Payer: Medicare PPO | Attending: Internal Medicine | Admitting: Internal Medicine

## 2020-06-17 ENCOUNTER — Emergency Department: Payer: Medicare PPO

## 2020-06-17 DIAGNOSIS — I10 Essential (primary) hypertension: Secondary | ICD-10-CM

## 2020-06-17 DIAGNOSIS — I639 Cerebral infarction, unspecified: Secondary | ICD-10-CM

## 2020-06-17 DIAGNOSIS — R4781 Slurred speech: Secondary | ICD-10-CM

## 2020-06-17 DIAGNOSIS — Z79899 Other long term (current) drug therapy: Secondary | ICD-10-CM | POA: Diagnosis not present

## 2020-06-17 DIAGNOSIS — Z20822 Contact with and (suspected) exposure to covid-19: Secondary | ICD-10-CM | POA: Diagnosis not present

## 2020-06-17 DIAGNOSIS — Z954 Presence of other heart-valve replacement: Secondary | ICD-10-CM | POA: Diagnosis not present

## 2020-06-17 DIAGNOSIS — I482 Chronic atrial fibrillation, unspecified: Secondary | ICD-10-CM

## 2020-06-17 DIAGNOSIS — R29810 Facial weakness: Secondary | ICD-10-CM | POA: Diagnosis present

## 2020-06-17 DIAGNOSIS — G459 Transient cerebral ischemic attack, unspecified: Secondary | ICD-10-CM | POA: Diagnosis not present

## 2020-06-17 DIAGNOSIS — Z85828 Personal history of other malignant neoplasm of skin: Secondary | ICD-10-CM | POA: Insufficient documentation

## 2020-06-17 DIAGNOSIS — Z7901 Long term (current) use of anticoagulants: Secondary | ICD-10-CM | POA: Diagnosis not present

## 2020-06-17 LAB — COMPREHENSIVE METABOLIC PANEL
ALT: 22 U/L (ref 0–44)
AST: 26 U/L (ref 15–41)
Albumin: 4.4 g/dL (ref 3.5–5.0)
Alkaline Phosphatase: 61 U/L (ref 38–126)
Anion gap: 11 (ref 5–15)
BUN: 18 mg/dL (ref 8–23)
CO2: 27 mmol/L (ref 22–32)
Calcium: 9.7 mg/dL (ref 8.9–10.3)
Chloride: 102 mmol/L (ref 98–111)
Creatinine, Ser: 0.79 mg/dL (ref 0.44–1.00)
GFR, Estimated: >60 mL/min (ref >60)
Glucose, Bld: 121 mg/dL — ABNORMAL HIGH (ref 70–99)
Potassium: 3.8 mmol/L (ref 3.5–5.1)
Sodium: 140 mmol/L (ref 135–145)
Total Bilirubin: 1.9 mg/dL — ABNORMAL HIGH (ref 0.3–1.2)
Total Protein: 8.3 g/dL — ABNORMAL HIGH (ref 6.5–8.1)

## 2020-06-17 LAB — DIFFERENTIAL
Abs Immature Granulocytes: 0.02 10*3/uL (ref 0.00–0.07)
Basophils Absolute: 0 10*3/uL (ref 0.0–0.1)
Basophils Relative: 1 %
Eosinophils Absolute: 0.4 10*3/uL (ref 0.0–0.5)
Eosinophils Relative: 5 %
Immature Granulocytes: 0 %
Lymphocytes Relative: 15 %
Lymphs Abs: 1.1 10*3/uL (ref 0.7–4.0)
Monocytes Absolute: 0.6 10*3/uL (ref 0.1–1.0)
Monocytes Relative: 8 %
Neutro Abs: 5.6 10*3/uL (ref 1.7–7.7)
Neutrophils Relative %: 71 %

## 2020-06-17 LAB — ECHOCARDIOGRAM COMPLETE
AR max vel: 1.75 cm2
AV Peak grad: 11.7 mmHg
Ao pk vel: 1.71 m/s
Area-P 1/2: 3.16 cm2
Height: 62 in
S' Lateral: 3.02 cm
Weight: 2400 oz

## 2020-06-17 LAB — CBC
HCT: 40.7 % (ref 36.0–46.0)
Hemoglobin: 13.2 g/dL (ref 12.0–15.0)
MCH: 30.2 pg (ref 26.0–34.0)
MCHC: 32.4 g/dL (ref 30.0–36.0)
MCV: 93.1 fL (ref 80.0–100.0)
Platelets: 273 10*3/uL (ref 150–400)
RBC: 4.37 MIL/uL (ref 3.87–5.11)
RDW: 13.8 % (ref 11.5–15.5)
WBC: 7.7 10*3/uL (ref 4.0–10.5)
nRBC: 0 % (ref 0.0–0.2)

## 2020-06-17 LAB — RESP PANEL BY RT-PCR (FLU A&B, COVID) ARPGX2
Influenza A by PCR: NEGATIVE
Influenza B by PCR: NEGATIVE
SARS Coronavirus 2 by RT PCR: NEGATIVE

## 2020-06-17 LAB — HEMOGLOBIN A1C
Hgb A1c MFr Bld: 5.4 % (ref 4.8–5.6)
Mean Plasma Glucose: 108.28 mg/dL

## 2020-06-17 LAB — PROTIME-INR
INR: 1 (ref 0.8–1.2)
Prothrombin Time: 13.1 seconds (ref 11.4–15.2)

## 2020-06-17 LAB — APTT: aPTT: 31 seconds (ref 24–36)

## 2020-06-17 LAB — LDL CHOLESTEROL, DIRECT: Direct LDL: 133.5 mg/dL — ABNORMAL HIGH (ref 0–99)

## 2020-06-17 LAB — CBG MONITORING, ED: Glucose-Capillary: 116 mg/dL — ABNORMAL HIGH (ref 70–99)

## 2020-06-17 MED ORDER — ADULT MULTIVITAMIN W/MINERALS CH
1.0000 | ORAL_TABLET | Freq: Every day | ORAL | Status: DC
  Administered 2020-06-18: 10:00:00 1 via ORAL
  Filled 2020-06-17 (×2): qty 1

## 2020-06-17 MED ORDER — SENNOSIDES-DOCUSATE SODIUM 8.6-50 MG PO TABS: 1.0000 | ORAL_TABLET | Freq: Every evening | ORAL | Status: DC | PRN

## 2020-06-17 MED ORDER — ACETAMINOPHEN 160 MG/5ML PO SOLN
650.0000 mg | ORAL | Status: DC | PRN
  Filled 2020-06-17: qty 20.3

## 2020-06-17 MED ORDER — IOHEXOL 350 MG/ML SOLN
75.0000 mL | Freq: Once | INTRAVENOUS | Status: AC | PRN
  Administered 2020-06-17: 75 mL via INTRAVENOUS

## 2020-06-17 MED ORDER — SODIUM CHLORIDE 0.9% FLUSH
3.0000 mL | Freq: Once | INTRAVENOUS | Status: AC
  Administered 2020-06-17: 3 mL via INTRAVENOUS

## 2020-06-17 MED ORDER — ACETAMINOPHEN 650 MG RE SUPP: 650.0000 mg | RECTAL | Status: DC | PRN

## 2020-06-17 MED ORDER — RISAQUAD PO CAPS
ORAL_CAPSULE | Freq: Every day | ORAL | Status: DC
  Administered 2020-06-18: 1 via ORAL
  Filled 2020-06-17: qty 1

## 2020-06-17 MED ORDER — VITAMIN D 25 MCG (1000 UNIT) PO TABS
ORAL_TABLET | Freq: Two times a day (BID) | ORAL | Status: DC
  Administered 2020-06-17 – 2020-06-18 (×2): 1000 [IU] via ORAL
  Filled 2020-06-17 (×2): qty 1

## 2020-06-17 MED ORDER — ACETAMINOPHEN 325 MG PO TABS: 650.0000 mg | ORAL_TABLET | ORAL | Status: DC | PRN

## 2020-06-17 MED ORDER — PANTOPRAZOLE SODIUM 40 MG PO TBEC
40.0000 mg | DELAYED_RELEASE_TABLET | Freq: Every day | ORAL | Status: DC
  Administered 2020-06-18: 40 mg via ORAL
  Filled 2020-06-17: qty 1

## 2020-06-17 MED ORDER — SODIUM CHLORIDE 0.9 % IV SOLN: INTRAVENOUS | Status: DC

## 2020-06-17 MED ORDER — STROKE: EARLY STAGES OF RECOVERY BOOK: Freq: Once | Status: AC

## 2020-06-17 MED ORDER — APIXABAN 5 MG PO TABS
5.0000 mg | ORAL_TABLET | Freq: Two times a day (BID) | ORAL | Status: DC
  Administered 2020-06-17 – 2020-06-18 (×2): 5 mg via ORAL
  Filled 2020-06-17 (×2): qty 1

## 2020-06-17 MED ORDER — HYDRALAZINE HCL 50 MG PO TABS: 25.0000 mg | ORAL_TABLET | Freq: Three times a day (TID) | ORAL | Status: AC | PRN

## 2020-06-17 NOTE — ED Triage Notes (Signed)
Pt states around 9am she noticed some trouble getting the right words out, then again when a maintenance person came to the house around 10am, states she was fine when she woke up this morning. At present no noticeable issue or slur with speech, no noted faical droop,.

## 2020-06-17 NOTE — ED Notes (Signed)
Pt to CT at this time.

## 2020-06-17 NOTE — ED Notes (Signed)
Pt assisted to bathroom at this time, tolerated well

## 2020-06-17 NOTE — ED Notes (Addendum)
Button pressed and RN Mardene Celeste on screen at this time.

## 2020-06-17 NOTE — ED Notes (Signed)
Pt states she takes blood thinners but stopped 3 days ago (Friday) for a dental procedure coming up

## 2020-06-17 NOTE — ED Notes (Signed)
Pt in room at this time  

## 2020-06-17 NOTE — ED Notes (Signed)
Assumed care of pt at 1900, pt eating dinner tray. Denies complaints at this time. Husband at bedside. Denies unilateral or generalized weakness, pain, dizziness, vision changes; no slurred speech noted. AO x4, talking in full sentences with regular and unlabored breathing. Awaiting instructions for MRI dependent upon pt pacer wires. Pt up ad lib to restroom with steady gait. Call bell within reach of pt

## 2020-06-17 NOTE — ED Notes (Signed)
Called lab about adding on Hemoglobin A1C and LDL to existing labs. Per lab tech, will add on

## 2020-06-17 NOTE — ED Notes (Signed)
CT called at this time for pt status. Per radiology tech, neurology assessed pt in CT and is on way to room at this time.

## 2020-06-17 NOTE — Consult Note (Signed)
NEUROLOGY CONSULTATION NOTE   Date of service: June 17, 2020 Patient Name: Joyce Evans MRN:  [id number] DOB:  [birth year] Reason for consult: "Stroke code" _ _ _   _ __   _ __ _ _  __ __   _ __   __ _  History of Present Illness  Joyce Evans is a 79 y.o. female with PMH significant for Arthritis, Atrial fibrillation on eliquis which was on hold for 3 days for dental procedure today, Gallstones (2016, 2017), GERD (gastroesophageal reflux disease), Glaucoma, Hypertension, Melanoma ([place]) (04/20/2016), Squamous cell carcinoma of skin, and Tricuspid valve disorder. who presents with  Acute onset slurring of speech. She woke up this morning and reports that she had difficutly with coming up with words and her speech was slurring. Husband noted that she had a right facial droop. She went to bed at 2300 on 06/16/20. She came to the ED and by the time I evaluated her in the CT scanner, her symptoms had resolved.  Does endorse prior hx of TIAs. Was not on aspirin while eliquis was on hold.   NIHSS components Score: Comment  1a Level of Conscious 0[x]  1[]  2[]  3[]      1b LOC Questions 0[x]  1[]  2[]       1c LOC Commands 0[x]  1[]  2[]       2 Best Gaze 0[x]  1[]  2[]       3 Visual 0[x]  1[]  2[]  3[]      4 Facial Palsy 0[x]  1[]  2[]  3[]      5a Motor Arm - left 0[x]  1[]  2[]  3[]  4[]  UN[]    5b Motor Arm - Right 0[x]  1[]  2[]  3[]  4[]  UN[]    6a Motor Leg - Left 0[x]  1[]  2[]  3[]  4[]  UN[]    6b Motor Leg - Right 0[x]  1[]  2[]  3[]  4[]  UN[]    7 Limb Ataxia 0[x]  1[]  2[]  3[]  UN[]     8 Sensory 0[x]  1[]  2[]  UN[]      9 Best Language 0[x]  1[]  2[]  3[]      10 Dysarthria 0[x]  1[]  2[]  UN[]      11 Extinct. and Inattention 0[x]  1[]  2[]       TOTAL: 0     LKW: 2300 on 06/16/20. MRS:0 TPA: no, resolution of symptoms Thrombectomy: no, resolution of symptoms.    ROS   Detailed ROS was negative except above.  Past History   Past Medical History:  Diagnosis Date  . Arthritis    osteo - shoulders, fingers  . Atrial  fibrillation ([place])   . Dizziness    thinks because of diuretic  . Family history of adverse reaction to anesthesia    daughter -PONV  . Gallstones 2016, 2017  . GERD (gastroesophageal reflux disease)   . Glaucoma   . Heart murmur    history of  . Hypertension   . Melanoma ([place]) 04/20/2016   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 Excised: 05/19/2016, margins free.  . Numbness in left leg    s/p hematoma  . Sciatica    right  . Seasonal allergies   . Skin cancer   . 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 Tricuspid valve disorder    leak   Past Surgical History:  Procedure Laterality Date  . ABDOMINAL HYSTERECTOMY  1987   partial  . CARDIAC CATHETERIZATION  2010   [facility]  . CATARACT EXTRACTION W/PHACO Right 08/28/2015   Procedure: CATARACT EXTRACTION PHACO AND INTRAOCULAR LENS PLACEMENT (IOC);  Surgeon: Leandrew Koyanagi, MD;  Location: [facility];  Service: Ophthalmology;  Laterality: Right;  TORIC  . MELANOMA EXCISION Left 05/19/2016   MIS. Left mid. lat. tricep. Margins free  . MOHS SURGERY  2013  . TONSILLECTOMY  1971   Family History  Problem Relation Age of Onset  . Breast cancer Daughter 43  . Breast cancer Paternal Aunt    Social History   Socioeconomic History  . Marital status: Married    Spouse name: Not on file  . Number of children: Not on file  . Years of education: Not on file  . Highest education level: Not on file  Occupational History  . Not on file  Tobacco Use  . Smoking status: Never Smoker  . Smokeless tobacco: Never Used  Substance and Sexual Activity  . Alcohol use: Yes    Comment: rare  . Drug use: Not on file  . Sexual activity: Not on file  Other Topics Concern  . Not on file  Social History Narrative  . Not on file   Social Determinants of Health   Financial Resource Strain:   . Difficulty of Paying Living Expenses: Not on file  Food Insecurity:   . Worried  About Charity fundraiser in the Last Year: Not on file  . Ran Out of Food in the Last Year: Not on file  Transportation Needs:   . Lack of Transportation (Medical): Not on file  . Lack of Transportation (Non-Medical): Not on file  Physical Activity:   . Days of Exercise per Week: Not on file  . Minutes of Exercise per Session: Not on file  Stress:   . Feeling of Stress : Not on file  Social Connections:   . Frequency of Communication with Friends and Family: Not on file  . Frequency of Social Gatherings with Friends and Family: Not on file  . Attends Religious Services: Not on file  . Active Member of Clubs or Organizations: Not on file  . Attends Archivist Meetings: Not on file  . Marital Status: Not on file   Allergies  Allergen Reactions  . Epinephrine Palpitations    Heart races  . Prevacid [Lansoprazole] Palpitations    Heart races    Medications  (Not in a hospital admission)    Vitals   Vitals:   06/17/20 1126 06/17/20 1130 06/17/20 1200 06/17/20 1207  BP: (!) 153/122  (!) 162/52   Pulse: 61  61   Resp: 18  12   Temp:  98.2 F (36.8 C)    TempSrc: Oral     SpO2: 97%  100%   Weight:    68 kg  Height:    5\' 2"  (1.575 m)     Body mass index is 27.44 kg/m.  Physical Exam   General: Laying comfortably in bed; in no acute distress.  HENT: Normal oropharynx and mucosa. Normal external appearance of ears and nose.  Neck: Supple, no pain or tenderness  CV: No JVD. No peripheral edema.  Pulmonary: Symmetric Chest rise. Normal respiratory effort.  Abdomen: non distended, non-tender.  Ext: No cyanosis, edema, or deformity  Skin: No rash. Normal palpation of skin.  Musculoskeletal: Normal digits and nails by inspection. No clubbing.  Neurologic Examination  Mental status/Cognition: Alert, oriented to self, place, month and year, good attention. Speech/language: Fluent, comprehension intact, object naming intact, repetition intact. Cranial nerves:    CN II Pupils equal and reactive to light, no VF deficits  CN III,IV,VI EOM intact, no gaze preference or deviation, no nystagmus   CN V normal sensation in V1, V2, and V3 segments bilaterally   CN VII no asymmetry, no nasolabial fold flattening   CN VIII normal hearing to speech   CN IX & X normal palatal elevation, no uvular deviation   CN XI 5/5 head turn and 5/5 shoulder shrug bilaterally   CN XII midline tongue protrusion   Motor:  Muscle bulk: normal, tone normal, pronator drift none tremor none Mvmt Root Nerve  Muscle Right Left Comments  SA C5/6 Ax Deltoid 5 5   EF C5/6 Mc Biceps 5 5   EE C6/7/8 Rad Triceps 5 5   WF C6/7 Med FCR 5 5   WE C7/8 PIN ECU 5 5   F Ab C8/T1 U ADM/FDI 5 5   HF L1/2/3 Fem Illopsoas 5 5   KE L2/3/4 Fem Quad     DF L4/5 D Peron Tib Ant 5 5   PF S1/2 Tibial Grc/Sol 5 5    Reflexes:  Right Left Comments  Pectoralis      Biceps (C5/6) 1 1   Brachioradialis (C5/6) 1 1    Triceps (C6/7) 1 1    Patellar (L3/4) 1 1    Achilles (S1)      Hoffman      Plantar     Jaw jerk    Sensation:  Light touch Intact throughout   Pin prick    Temperature    Vibration   Proprioception    Coordination/Complex Motor:  - Finger to Nose intact BL - Heel to shin intact BL - Rapid alternating movement are normal - Gait: deferred.  Labs   CBC:  Recent Labs  Lab 06/17/20 1129  WBC 7.7  NEUTROABS 5.6  HGB 13.2  HCT 40.7  MCV 93.1  PLT 062    Basic Metabolic Panel:  Lab Results  Component Value Date   NA 140 06/17/2020   K 3.8 06/17/2020   CO2 27 06/17/2020   GLUCOSE 121 (H) 06/17/2020   BUN 18 06/17/2020   CREATININE 0.79 06/17/2020   CALCIUM 9.7 06/17/2020   GFRNONAA >60 06/17/2020   Lipid Panel: No results found for: LDLCALC HgbA1c: No results found for: HGBA1C Urine Drug Screen: No results found for: LABOPIA, COCAINSCRNUR, LABBENZ, AMPHETMU, THCU, LABBARB  Alcohol Level No results found for: [place]  CT Head without contrast: CTH was  negative for a large hypodensity concerning for a large territory infarct or hyperdensity concerning for an ICH  MRI Brain pending.   Impression   Joyce Evans is a 79 y.o. female with PMH significant for Afibb with eliquis on hold for 3 days due to a dental procedure, hx of HTN who presents with an episode of aphasia, dysarthria, R facial droop that self resolved. Symptoms do localize to the L MCA territory and are potentially concerning for a TIA.  Recommendations  Plan:  - Frequent Neuro checks per stroke unit protocol - Recommend brain imaging with MRI Brain without contrast - Recommend Vascular imaging with MRA Angio Head without contrast and US Carotid doppler - Recommend obtaining TTE - Recommend obtaining Lipid panel with LDL - Please start statin if LDL > 70 - Recommend HbA1c - Recommend resuming Eliquis if okay with team. If team would like to hold off on Eliquis, recommend continuing Aspirin 81mg  daily. - SBP goal - permissive hypertension first 24 h < 220/110. Held home meds.  - Recommend Telemetry monitoring  for arrythmia - Recommend bedside swallow screen prior to PO intake. - Stroke education booklet - Recommend PT/OT/SLP consult  ______________________________________________________________________   Thank you for the opportunity to take part in the care of this patient. If you have any further questions, please contact the neurology consultation attending.  Signed,  [place] Pager Number [phone] _ _ _   _ __   _ __ _ _  __ __   _ __   __ _

## 2020-06-17 NOTE — Progress Notes (Signed)
*  PRELIMINARY RESULTS* Echocardiogram 2D Echocardiogram has been performed.  Joyce Evans 06/17/2020, 8:08 PM

## 2020-06-17 NOTE — ED Provider Notes (Signed)
[facility] Emergency Department Provider Note   ____________________________________________   First MD Initiated Contact with Patient 06/17/20 1134     (approximate)  I have reviewed the triage vital signs and the nursing notes.   HISTORY  Chief Complaint Code Stroke    HPI Joyce Evans is a 79 y.o. female with past medical history of hypertension, atrial fibrillation on Eliquis, and GERD who presents to the ED for stroke symptoms.  Patient reports that she was initially feeling fine when she woke up this morning but while eating breakfast she seemed to start slurring her speech and her husband thought the right side of her face appeared droopy.  She describes some numbness and tingling along the right side of her face, but did not notice any numbness or weakness in her extremities.  Her speech now seems to have returned to normal and she also feels like the sensation in her face is back to normal.  She has otherwise been feeling well recently with no fevers, cough, chest pain, or shortness of breath.  She has been holding her Eliquis for the past couple of days because she was scheduled for a dental procedure today.        Past Medical History:  Diagnosis Date  . Arthritis    osteo - shoulders, fingers  . Atrial fibrillation ([place])   . Dizziness    thinks because of diuretic  . Family history of adverse reaction to anesthesia    daughter -PONV  . Gallstones 2016, 2017  . GERD (gastroesophageal reflux disease)   . Glaucoma   . Heart murmur    history of  . Hypertension   . Melanoma ([place]) 04/20/2016   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 Excised: 05/19/2016, margins free.  . Numbness in left leg    s/p hematoma  . Sciatica    right  . Seasonal allergies   . Skin cancer   . 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 Tricuspid valve disorder    leak    Patient Active Problem List    Diagnosis Date Noted  . TIA (transient ischemic attack) 06/17/2020  . Gallstones     Past Surgical History:  Procedure Laterality Date  . ABDOMINAL HYSTERECTOMY  1987   partial  . CARDIAC CATHETERIZATION  2010   [facility]  . CATARACT EXTRACTION W/PHACO Right 08/28/2015   Procedure: CATARACT EXTRACTION PHACO AND INTRAOCULAR LENS PLACEMENT (IOC);  Surgeon: Leandrew Koyanagi, MD;  Location: [place];  Service: Ophthalmology;  Laterality: Right;  TORIC  . MELANOMA EXCISION Left 05/19/2016   MIS. Left mid. lat. tricep. Margins free  . MOHS SURGERY  2013  . TONSILLECTOMY  1971    Prior to Admission medications   Medication Sig Start Date End Date Taking? Authorizing Provider  acetaminophen (TYLENOL) 500 MG tablet Take 500 mg by mouth every 6 (six) hours as needed.    [provider]  ALPRAZolam Duanne Moron) 0.25 MG tablet Take 0.125 mg by mouth at bedtime.    [provider]  apixaban (ELIQUIS) 5 MG TABS tablet TAKE 1 TABLET BY MOUTH TWO  TIMES DAILY 01/04/18   [provider]  brimonidine (ALPHAGAN P) 0.1 % SOLN Place 1 drop into the right eye 2 (two) times daily.    [provider]  Brinzolamide-Brimonidine [facility]) 1-0.2 % SUSP Place 1 drop into the left eye 2 (two) times daily.  [provider]  Calcium-Phosphorus-Vitamin D (CITRACAL +D3 PO) Take by mouth daily.    [provider]  Cholecalciferol (VITAMIN D3) 3000 units TABS Take by mouth 2 (two) times daily.    [provider]  famotidine-calcium carbonate-magnesium hydroxide (PEPCID COMPLETE) 10-800-165 MG chewable tablet Chew 1 tablet by mouth daily as needed.    [provider]  furosemide (LASIX) 20 MG tablet Take by mouth. 02/28/18   [provider]  multivitamin-iron-minerals-folic acid (CENTRUM) chewable tablet Chew 1 tablet by mouth daily.    [provider]  omeprazole (PRILOSEC) 20 MG capsule Take 20 mg by mouth 2 (two) times daily.  Reported on 08/28/2015    [provider]  potassium chloride (K-DUR) 10 MEQ tablet Take by mouth. 02/28/18 03/30/18  [provider]  Probiotic Product (PROBIOTIC DAILY PO) Take by mouth daily.    [provider]  timolol (BETIMOL) 0.5 % ophthalmic solution Place 1 drop into both eyes daily.    [provider]    Allergies Epinephrine and Prevacid [lansoprazole]  Family History  Problem Relation Age of Onset  . Breast cancer Daughter 29  . Breast cancer Paternal Aunt     Social History Social History   Tobacco Use  . Smoking status: Never Smoker  . Smokeless tobacco: Never Used  Substance Use Topics  . Alcohol use: Yes    Comment: rare  . Drug use: Not on file    Review of Systems  Constitutional: No fever/chills Eyes: No visual changes. ENT: No sore throat. Cardiovascular: Denies chest pain. Respiratory: Denies shortness of breath. Gastrointestinal: No abdominal pain.  No nausea, no vomiting.  No diarrhea.  No constipation. Genitourinary: Negative for dysuria. Musculoskeletal: Negative for back pain. Skin: Negative for rash. Neurological: Negative for headaches, positive for facial numbness and slurred speech.  ____________________________________________   PHYSICAL EXAM:  VITAL SIGNS: ED Triage Vitals  Enc Vitals Group     BP 06/17/20 1126 (!) 153/122     Pulse Rate 06/17/20 1126 61     Resp 06/17/20 1126 18     Temp 06/17/20 1130 98.2 F (36.8 C)     Temp Source 06/17/20 1126 Oral     SpO2 06/17/20 1126 97 %     Weight --      Height --      Head Circumference --      Peak Flow --      Pain Score 06/17/20 1127 0     Pain Loc --      Pain Edu? --      Excl. in [place]? --     Constitutional: Alert and oriented. Eyes: Conjunctivae are normal. Head: Atraumatic. Nose: No congestion/rhinnorhea. Mouth/Throat: Mucous membranes are moist. Neck: Normal ROM Cardiovascular: Normal rate, regular rhythm. Grossly normal heart  sounds. Respiratory: Normal respiratory effort.  No retractions. Lungs CTAB. Gastrointestinal: Soft and nontender. No distention. Genitourinary: deferred Musculoskeletal: No lower extremity tenderness nor edema. Neurologic:  Normal speech and language. No gross focal neurologic deficits are appreciated. Skin:  Skin is warm, dry and intact. No rash noted. Psychiatric: Mood and affect are normal. Speech and behavior are normal.  ____________________________________________   LABS (all labs ordered are listed, but only abnormal results are displayed)  Labs Reviewed  COMPREHENSIVE METABOLIC PANEL - Abnormal; Notable for the following components:      Result Value   Glucose, Bld 121 (*)    Total Protein 8.3 (*)    Total Bilirubin 1.9 (*)  All other components within normal limits  RESP PANEL BY RT-PCR (FLU A&B, COVID) ARPGX2  PROTIME-INR  APTT  CBC  DIFFERENTIAL  HEMOGLOBIN A1C  LDL CHOLESTEROL, DIRECT  CBG MONITORING, ED   ____________________________________________  EKG  ED ECG REPORT I, Blake Divine, the attending physician, personally viewed and interpreted this ECG.   Date: 06/17/2020  EKG Time: 11:18  Rate: 59  Rhythm: atrial fibrillation, rate 59  Axis: RAD  Intervals:nonspecific intraventricular conduction delay  ST&T Change: None   PROCEDURES  Procedure(s) performed (including Critical Care):  .1-3 Lead EKG Interpretation Performed by: Blake Divine, MD Authorized by: Blake Divine, MD     Interpretation: abnormal     ECG rate:  58   ECG rate assessment: bradycardic     Rhythm: atrial fibrillation     Ectopy: none     Conduction: normal       ____________________________________________   INITIAL IMPRESSION / ASSESSMENT AND PLAN / ED COURSE       79 year old female with past medical history of hypertension, atrial fibrillation on Eliquis, and GERD who presents to the ED for episode of slurred speech and right-sided facial droop with  numbness earlier this morning.  Code stroke was called on patient's arrival, she was subsequently brought immediately to [place] where she was also evaluated by neurology.  All of her symptoms seem to have resolved at this point and presentation is most consistent with TIA.  CT head is reviewed by me and shows no obvious hemorrhage, negative for acute process per radiology.  Lab work is unremarkable.  Case discussed with hospitalist for further TIA work-up.      ____________________________________________   FINAL CLINICAL IMPRESSION(S) / ED DIAGNOSES  Final diagnoses:  TIA (transient ischemic attack)  Slurred speech     ED Discharge Orders    None       Note:  This document was prepared using Dragon voice recognition software and may include unintentional dictation errors.   Blake Divine, MD 06/17/20 [phone]

## 2020-06-17 NOTE — ED Notes (Signed)
EDP [place] at bedside

## 2020-06-17 NOTE — ED Notes (Signed)
Report given to Gracie, RN 

## 2020-06-17 NOTE — ED Notes (Addendum)
Cox DO informed about pt concern for home medications at this time. Per Cox DO, "permissive hypertension, see orders". This RN found no medication orders at this time, informed DO Cox to order home meds so this RN is able to see those orders.

## 2020-06-17 NOTE — H&P (Signed)
History and Physical   Joyce Evans PPI:[id number] DOB: November 08, 1940 DOA: 06/17/2020  PCP: Derinda Late, MD  Outpatient Specialists:  Patient coming from: Home via private vehicle  I have personally briefly reviewed patient's old medical records in [place].  Chief Concern: Strokelike symptoms  HPI: Joyce Evans is a 79 y.o. female with medical history significant for chronic respiratory failure on baseline 4 L nasal cannula, on lasix for fluid around lungs and heart, tricuspid valve replacement and chronic afib on eliquis, GERD, melanoma, interstitial lung disease presented to the emergency department for chief concerns of strokelike symptoms that started at approximately 9 AM on day of admission, 06/17/2020.  Of note, her home eliquis was held on 06/14/2020 in anticipation of dental procedure today, 06/17/2020.  She reports that she experienced difficulty speaking and 'wobbling' when walking, started 9 AM on day of admission, lasted until 11 AM. Patient did not want to come to the hospital therefore she did not presented until approximately past 11 AM.   Husband at bedside states he noticed drooping of her lips, however he does not remember which side.  ROS was negative for vision changes, nausea, vomiting, fever, abdominal pain, chest pain, numbness in arms and legs, denies having these symptoms before, dysuria, hematuria, diarrhea, blood in stool, changes to swelling in her   She endorses baseline oxygen, 4 L [place] due to failing baseline breathing and is currently being worked up for interstial lung disease. ROS was positive for chills x 2 nights. She endorsed night sweats last week for two nights. She endorses headache that started last night, crown of head and this is not a new headache.  She denies having the symptoms before.  Social history: lives with her spouse. She is retired and formerly worked as Set designer. She denies tobacco, etoh, recreational drug use.   ED  Course: Discussed with ED provider, requesting admission for TIA  Review of Systems: As per HPI otherwise 10 point review of systems negative.   Assessment/Plan  Active Problems:   TIA (transient ischemic attack)   TIA- -Neurology consulted -Neurology recommended brain imaging with MRI of the brain without contrast and vascular imaging with MRA head without contrast, however patient has retained epicardial leads and not a candidate for MRI -CTA of the head and CTA of the neck ordered -Ultrasound of carotid Doppler -Echo ordered -Lipid panel and hemoglobin A1c -Hemoglobin A1c checked was 5.4 -Permissive hypertension for 24 hours less than 220/110 -Admit to observation with telemetry -Nursing bedside swallow -Stroke information -PT/OT  Atrial fibrillation with tricuspid valve repair-resumed home Eliquis  Hypertension-elevated -Permissive hypertension -Holding home amlodipine 5 mg daily, carvedilol 12.5 mg twice daily, losartan 100 mg daily, spironolactone 12.5 mg daily, furosemide 20 mg daily  Interstitial lung disease-outpatient follow-up with [facility] pulmonology  GERD-resumed home PPI  Chart reviewed.   DVT prophylaxis: Eliquis resumed Code Status: Full code Diet: Cardiac diet Family Communication: Updated spouse at bedside Disposition Plan: Pending clinical course Consults called: Neurology Admission status: Observation  Past Medical History:  Diagnosis Date  . Arthritis    osteo - shoulders, fingers  . Atrial fibrillation ([place])   . Dizziness    thinks because of diuretic  . Family history of adverse reaction to anesthesia    daughter -PONV  . Gallstones 2016, 2017  . GERD (gastroesophageal reflux disease)   . Glaucoma   . Heart murmur    history of  . Hypertension   . Melanoma ([place]) 04/20/2016  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 Excised: 05/19/2016, margins free.  . Numbness in left leg    s/p hematoma  .  Sciatica    right  . Seasonal allergies   . Skin cancer   . 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 Tricuspid valve disorder    leak   Past Surgical History:  Procedure Laterality Date  . ABDOMINAL HYSTERECTOMY  1987   partial  . CARDIAC CATHETERIZATION  2010   [facility]  . CATARACT EXTRACTION W/PHACO Right 08/28/2015   Procedure: CATARACT EXTRACTION PHACO AND INTRAOCULAR LENS PLACEMENT (IOC);  Surgeon: Leandrew Koyanagi, MD;  Location: [place];  Service: Ophthalmology;  Laterality: Right;  TORIC  . MELANOMA EXCISION Left 05/19/2016   MIS. Left mid. lat. tricep. Margins free  . MOHS SURGERY  2013  . TONSILLECTOMY  1971   Social History:  reports that she has never smoked. She has never used smokeless tobacco. She reports current alcohol use. No history on file for drug use.  Allergies  Allergen Reactions  . Other Itching    Phlebitis and itching at vein sites  . Epinephrine Palpitations    Heart races  . Prevacid [Lansoprazole] Palpitations    Heart races   Family History  Problem Relation Age of Onset  . Breast cancer Daughter 40  . Breast cancer Paternal Aunt    Family history: Family history reviewed and not pertinent  Prior to Admission medications   Medication Sig Start Date End Date Taking? Authorizing Provider  acetaminophen (TYLENOL) 500 MG tablet Take 500 mg by mouth every 6 (six) hours as needed.    [provider]  ALPRAZolam Duanne Moron) 0.25 MG tablet Take 0.125 mg by mouth at bedtime.    [provider]  apixaban (ELIQUIS) 5 MG TABS tablet TAKE 1 TABLET BY MOUTH TWO  TIMES DAILY 01/04/18   [provider]  brimonidine (ALPHAGAN P) 0.1 % SOLN Place 1 drop into the right eye 2 (two) times daily.    [provider]  Brinzolamide-Brimonidine [facility]) 1-0.2 % SUSP Place 1 drop into the left eye 2 (two) times daily.    [provider]  Calcium-Phosphorus-Vitamin D (CITRACAL +D3 PO) Take by mouth  daily.    [provider]  Cholecalciferol (VITAMIN D3) 3000 units TABS Take by mouth 2 (two) times daily.    [provider]  famotidine-calcium carbonate-magnesium hydroxide (PEPCID COMPLETE) 10-800-165 MG chewable tablet Chew 1 tablet by mouth daily as needed.    [provider]  furosemide (LASIX) 20 MG tablet Take by mouth. 02/28/18   [provider]  multivitamin-iron-minerals-folic acid (CENTRUM) chewable tablet Chew 1 tablet by mouth daily.    [provider]  omeprazole (PRILOSEC) 20 MG capsule Take 20 mg by mouth 2 (two) times daily. Reported on 08/28/2015    [provider]  potassium chloride (K-DUR) 10 MEQ tablet Take by mouth. 02/28/18 03/30/18  [provider]  Probiotic Product (PROBIOTIC DAILY PO) Take by mouth daily.    [provider]  timolol (BETIMOL) 0.5 % ophthalmic solution Place 1 drop into both eyes daily.    [provider]   Physical Exam: Vitals:   06/17/20 1630 06/17/20 1700 06/17/20 1730 06/17/20 1745  BP: (!) 164/82 (!) 162/64 (!) 171/38 (!) 171/38  Pulse: (!) 52 (!) 59 (!) 51 (!) 55  Resp: 19 19 19 16   Temp:  TempSrc:      SpO2: 100% 100% 100% 100%  Weight:      Height:       Constitutional: appears age-appropriate, NAD, calm, comfortable Eyes: PERRL, lids and conjunctivae normal ENMT: Mucous membranes are moist. Posterior pharynx clear of any exudate or lesions. Age-appropriate dentition. Hearing appropriate Neck: normal, supple, no masses, no thyromegaly Respiratory: clear to auscultation bilaterally, no wheezing, no crackles. Normal respiratory effort. No accessory muscle use.  Cardiovascular: Regular rate and rhythm, no murmurs / rubs / gallops. No extremity edema. 2+ pedal pulses. No carotid bruits.  Abdomen: no tenderness, no masses palpated, no hepatosplenomegaly. Bowel sounds positive.  Musculoskeletal: no clubbing / cyanosis. No joint deformity upper and lower  extremities. Good ROM, no contractures, no atrophy. Normal muscle tone.  Skin: no rashes, lesions, ulcers. No induration Neurologic: Sensation intact. Strength 5/5 in all 4.  Psychiatric: Normal judgment and insight. Alert and oriented x 3. Normal mood.   EKG: Independently reviewed, showing atrial fibrillation with slow RVR, rate of 59, QTc 437  Chest x-ray on Admission: Personally reviewed and I agree with radiologist reading as below.  CT ANGIO HEAD W OR WO CONTRAST  Result Date: 06/17/2020 CLINICAL DATA:  79 year old female with slurred speech and right facial droop earlier today, symptoms now resolved. TIA. Retained epicardial pacer leads contraindicating MRI. EXAM: CT ANGIOGRAPHY HEAD AND NECK TECHNIQUE: Multidetector CT imaging of the head and neck was performed using the standard protocol during bolus administration of intravenous contrast. Multiplanar CT image reconstructions and MIPs were obtained to evaluate the vascular anatomy. Carotid stenosis measurements (when applicable) are obtained utilizing NASCET criteria, using the distal internal carotid diameter as the denominator. CONTRAST:  101mL OMNIPAQUE IOHEXOL 350 MG/ML SOLN COMPARISON:  Plain head CT 1138 hours today. FINDINGS: CTA NECK Skeleton: Facet degeneration but otherwise mild for age cervical spine degenerative changes. Mild upper thoracic scoliosis. No acute osseous abnormality identified. Upper chest: Low lung volumes with probable bilateral upper lobe atelectasis. Other neck: Negative. Aortic arch: 3 vessel arch configuration. Mild for age calcified arch atherosclerosis. Right carotid system: Tortuous brachiocephalic artery and mildly tortuous proximal right CCA without stenosis. Calcified plaque at the right ICA origin and bulb not resulting in stenosis. Left carotid system: Minimal calcified plaque at the left CCA origin without stenosis. Tortuous left CCA with a kinked appearance in the neck. Calcified plaque at the left ICA  origin results in up to 50 % stenosis with respect to the distal vessel. Vertebral arteries: Mild plaque in the proximal right subclavian artery and at the right vertebral artery origin without stenosis (series 516, image 146). Non dominant right vertebral artery is diminutive but patent to the skull base without stenosis. Soft and calcified plaque in the proximal left subclavian artery without stenosis. Normal left vertebral artery origin. Dominant left vertebral with tortuous V1 segment. Patent left vertebral to the skull base without stenosis. CTA HEAD Posterior circulation: Dominant left V4 segment primarily supplies the basilar. Non dominant right vertebral is highly diminutive beyond the patent PICA origin. Left AICA appears dominant and patent. No distal vertebral stenosis. Patent basilar artery without stenosis. Patent SCA origins. Fetal left PCA origin. Normal right PCA origin with small right posterior communicating artery. Bilateral PCA branches are patent with mild irregularity, including in the right P2 segment. Anterior circulation: Both ICA siphons are patent. Mild to moderate left supraclinoid stenosis due to calcified plaque (series 517, image 127). Similar calcified plaque in the right ICA cavernous and supraclinoid segments with moderate  to severe right siphon stenosis just beyond the anterior genu (series 516, image 73). Normal posterior communicating artery origins. Patent carotid termini. Normal MCA and ACA origins. Mildly dominant right A1. Anterior communicating artery and bilateral ACA branches are within normal limits. Left MCA M1 segment is tortuous and patent to the bifurcation without stenosis. Left MCA branches are within normal limits. Right MCA M1 segment is patent to the bifurcation without stenosis. Right MCA branches are within normal limits. Venous sinuses: Patent. Anatomic variants: Dominant left vertebral artery, the right functionally terminates in PICA. Fetal left PCA origin.  Review of the MIP images confirms the above findings IMPRESSION: 1. Negative for large vessel occlusion. 2. 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 Mild to moderate Left siphon stenosis. Left greater than Right cervical carotid calcified plaque with up to 50% stenosis at the Left ICA origin. 3. Dominant Left Vertebral Artery with mild posterior circulation atherosclerosis and no significant stenosis. Electronically Signed   By: Genevie Ann M.D.   On: 06/17/2020 19:05   DG Chest 2 View  Result Date: 06/17/2020 CLINICAL DATA:  Hypertension, atrial fibrillation EXAM: CHEST - 2 VIEW COMPARISON:  None. FINDINGS: Heart size is mildly enlarged. Atherosclerotic calcification of the aortic knob. Prosthetic cardiac valve. Thin curvilinear density at the right heart border and lower right chest wall likely retained epicardial pacing wires. Mildly prominent interstitial markings bilaterally. No focal airspace consolidation. No pleural effusion or pneumothorax. Severe degenerative changes of the left shoulder. Surgical clips in the region of the right axilla. IMPRESSION: 1. Probable retained epicardial pacing wires. 2. Mildly prominent interstitial markings bilaterally, which may reflect bronchitic type lung changes versus mild edema. Electronically Signed   By: Davina Poke D.O.   On: 06/17/2020 14:09   CT ANGIO NECK W OR WO CONTRAST  Result Date: 06/17/2020 CLINICAL DATA:  79 year old female with slurred speech and right facial droop earlier today, symptoms now resolved. TIA. Retained epicardial pacer leads contraindicating MRI. EXAM: CT ANGIOGRAPHY HEAD AND NECK TECHNIQUE: Multidetector CT imaging of the head and neck was performed using the standard protocol during bolus administration of intravenous contrast. Multiplanar CT image reconstructions and MIPs were obtained to evaluate the vascular anatomy. Carotid stenosis measurements (when applicable) are  obtained utilizing NASCET criteria, using the distal internal carotid diameter as the denominator. CONTRAST:  14mL OMNIPAQUE IOHEXOL 350 MG/ML SOLN COMPARISON:  Plain head CT 1138 hours today. FINDINGS: CTA NECK Skeleton: Facet degeneration but otherwise mild for age cervical spine degenerative changes. Mild upper thoracic scoliosis. No acute osseous abnormality identified. Upper chest: Low lung volumes with probable bilateral upper lobe atelectasis. Other neck: Negative. Aortic arch: 3 vessel arch configuration. Mild for age calcified arch atherosclerosis. Right carotid system: Tortuous brachiocephalic artery and mildly tortuous proximal right CCA without stenosis. Calcified plaque at the right ICA origin and bulb not resulting in stenosis. Left carotid system: Minimal calcified plaque at the left CCA origin without stenosis. Tortuous left CCA with a kinked appearance in the neck. Calcified plaque at the left ICA origin results in up to 50 % stenosis with respect to the distal vessel. Vertebral arteries: Mild plaque in the proximal right subclavian artery and at the right vertebral artery origin without stenosis (series 516, image 146). Non dominant right vertebral artery is diminutive but patent to the skull base without stenosis. Soft and calcified plaque in the proximal left subclavian artery without stenosis. Normal left vertebral artery origin. Dominant left vertebral with tortuous  V1 segment. Patent left vertebral to the skull base without stenosis. CTA HEAD Posterior circulation: Dominant left V4 segment primarily supplies the basilar. Non dominant right vertebral is highly diminutive beyond the patent PICA origin. Left AICA appears dominant and patent. No distal vertebral stenosis. Patent basilar artery without stenosis. Patent SCA origins. Fetal left PCA origin. Normal right PCA origin with small right posterior communicating artery. Bilateral PCA branches are patent with mild irregularity, including in  the right P2 segment. Anterior circulation: Both ICA siphons are patent. Mild to moderate left supraclinoid stenosis due to calcified plaque (series 517, image 127). Similar calcified plaque in the right ICA cavernous and supraclinoid segments with moderate to severe right siphon stenosis just beyond the anterior genu (series 516, image 73). Normal posterior communicating artery origins. Patent carotid termini. Normal MCA and ACA origins. Mildly dominant right A1. Anterior communicating artery and bilateral ACA branches are within normal limits. Left MCA M1 segment is tortuous and patent to the bifurcation without stenosis. Left MCA branches are within normal limits. Right MCA M1 segment is patent to the bifurcation without stenosis. Right MCA branches are within normal limits. Venous sinuses: Patent. Anatomic variants: Dominant left vertebral artery, the right functionally terminates in PICA. Fetal left PCA origin. Review of the MIP images confirms the above findings IMPRESSION: 1. Negative for large vessel occlusion. 2. 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 Mild to moderate Left siphon stenosis. Left greater than Right cervical carotid calcified plaque with up to 50% stenosis at the Left ICA origin. 3. Dominant Left Vertebral Artery with mild posterior circulation atherosclerosis and no significant stenosis. Electronically Signed   By: Genevie Ann M.D.   On: 06/17/2020 19:05   CT HEAD CODE STROKE WO CONTRAST  Result Date: 06/17/2020 CLINICAL DATA:  Code stroke. Slurred speech. Last seen normal 0900 hours. EXAM: CT HEAD WITHOUT CONTRAST TECHNIQUE: Contiguous axial images were obtained from the base of the skull through the vertex without intravenous contrast. COMPARISON:  None. FINDINGS: Brain: No evidence of atrophy or malformation. No sign of acute large or small vessel infarction by CT. No mass, hemorrhage, hydrocephalus or extra-axial collection.  Insignificant punctate calcification at the inferior basal ganglia on the left. Vascular: There is atherosclerotic calcification of the major vessels at the base of the brain. Skull: Negative Sinuses/Orbits: Clear/normal Other: None ASPECTS ([place] Stroke Program Early CT Score) - Ganglionic level infarction (caudate, lentiform nuclei, internal capsule, insula, M1-M3 cortex): 7 - Supraganglionic infarction (M4-M6 cortex): 3 Total score (0-10 with 10 being normal): 10 IMPRESSION: 1. Normal head CT for age. 2. ASPECTS is 10. 3. These results were called by telephone at the time of interpretation on 06/17/2020 at 11:49 am to provider Dr. Alferd Patee, Who verbally acknowledged these results. Electronically Signed   By: Nelson Chimes M.D.   On: 06/17/2020 11:50   Labs on Admission: I have personally reviewed following labs  CBC: Recent Labs  Lab 06/17/20 1129  WBC 7.7  NEUTROABS 5.6  HGB 13.2  HCT 40.7  MCV 93.1  PLT 952   Basic Metabolic Panel: Recent Labs  Lab 06/17/20 1129  NA 140  K 3.8  CL 102  CO2 27  GLUCOSE 121*  BUN 18  CREATININE 0.79  CALCIUM 9.7   GFR: Estimated Creatinine Clearance: 53.3 mL/min (by C-G formula based on SCr of 0.79 mg/dL).  Liver Function Tests: Recent Labs  Lab 06/17/20 1129  AST 26  ALT 22  ALKPHOS 61  BILITOT 1.9*  PROT 8.3*  ALBUMIN 4.4   Coagulation Profile: Recent Labs  Lab 06/17/20 1129  INR 1.0   Lacorey Brusca N Jhoan Schmieder D.O. [facility]  If 12AM-7AM, please contact overnight-coverage provider If 7AM-7PM, please contact day coverage provider [email or web address]  06/17/2020, 7:14 PM

## 2020-06-17 NOTE — ED Notes (Signed)
Echo at bedside

## 2020-06-17 NOTE — ED Notes (Signed)
Meal tray left at bedside for when echo is completed

## 2020-06-18 ENCOUNTER — Observation Stay: Payer: Medicare PPO

## 2020-06-18 DIAGNOSIS — G459 Transient cerebral ischemic attack, unspecified: Secondary | ICD-10-CM | POA: Diagnosis not present

## 2020-06-18 DIAGNOSIS — I10 Essential (primary) hypertension: Secondary | ICD-10-CM | POA: Diagnosis not present

## 2020-06-18 LAB — LIPID PANEL
Cholesterol: 184 mg/dL (ref 0–200)
HDL: 51 mg/dL (ref >40)
LDL Cholesterol: 122 mg/dL — ABNORMAL HIGH (ref 0–99)
Total CHOL/HDL Ratio: 3.6 RATIO
Triglycerides: 57 mg/dL (ref <150)
VLDL: 11 mg/dL (ref 0–40)

## 2020-06-18 MED ORDER — ATORVASTATIN CALCIUM 20 MG PO TABS: 40.0000 mg | ORAL_TABLET | Freq: Every day | ORAL | Status: DC

## 2020-06-18 MED ORDER — BRIMONIDINE TARTRATE 0.2 % OP SOLN
1.0000 [drp] | Freq: Two times a day (BID) | OPHTHALMIC | Status: DC
  Administered 2020-06-18: 1 [drp] via OPHTHALMIC
  Filled 2020-06-18: qty 5

## 2020-06-18 MED ORDER — ATORVASTATIN CALCIUM 20 MG PO TABS
80.0000 mg | ORAL_TABLET | Freq: Every day | ORAL | Status: DC
  Administered 2020-06-18: 10:00:00 80 mg via ORAL
  Filled 2020-06-18: qty 4

## 2020-06-18 MED ORDER — ATORVASTATIN CALCIUM 40 MG PO TABS: 40.0000 mg | ORAL_TABLET | Freq: Every day | ORAL | 3 refills | Status: DC

## 2020-06-18 NOTE — Discharge Instructions (Signed)
Cognitive Rehabilitation After a Stroke After a stroke, you may have various problems with thinking (cognitive disability). The types of problems you have will depend on how severe the stroke was and where it was located in the brain. Problems may include:  Problems with short-term memory.  Trouble paying attention.  Trouble communicating or understanding language (aphasia).  A drop in mental ability that may interfere with daily life (dementia).  Trouble with problem-solving and information processing.  Problems with reading, writing, or math.  Problems with your ability to plan and to perform activities in sequence (executive function). These problems can feel overwhelming. However, with rehabilitation and time to heal, many people have improvement in their symptoms. What causes cognitive disability? A stroke happens when blood cannot flow to certain areas of the brain. When this happens, brain cells die in the affected areas because they cannot get oxygen and nutrients from the blood. Cognitive disability is caused by the death of cells in the areas of the brain that control thinking. What is cognitive rehabilitation? Cognitive rehabilitation is a program to help you improve your thinking skills after a stroke. Rehabilitation cannot completely reverse the effects of a stroke, but it can help you with memory, problem-solving, and communication skills. Therapy focuses on:  Improving brain function. This may involve activities such as learning to break down tasks into simple steps.  Helping you learn ways to cope with thinking problems. For example, you might learn memory tricks or do activities that stimulate memory, such as naming objects or describing pictures. Cognitive rehabilitation may include:  Speech-language therapy to help you understand and use language to communicate.  Occupational therapy to help you perform daily activities.  Music therapy to help relieve stress,  anxiety, and depression. This may involve listening to music, singing, or playing instruments.  Physical therapy to help improve your ability to move and perform actions that involve the muscles (motor functions). When will therapy start and where will I have therapy? Your health care provider will decide when it is best for you to start therapy. In some cases, people start rehabilitation as soon as their health is stable, which may be 24-48 hours after the stroke. Rehabilitation can take place in a few different places, based on your needs. It may take place in:  The hospital or an in-patient rehabilitation hospital.  An outpatient rehabilitation facility.  A long-term care facility.  A community rehabilitation clinic.  Your home. What are some tools to help after a stroke? There are a number of tools and apps that you can use on your smartphone, personal computer, or tablet to help improve brain function. Some of these apps include:  Calendar reminders or alarm apps to help with memory.  Note-taking or sketch pad apps to help with memory or communication.  Text-to-speech apps that allow you to listen to what you are reading, which helps your ability to understanding text.  Picture dictionary or picture message apps to help with communication.  E-readers. These can highlight text as it is read aloud, which helps with listening and reading skills. How can my friends or family help during my rehabilitation? During your recovery, it is important that your friends and family members help you work toward more independence. Your caregivers should speak with your health care providers to learn how they can best help you during recovery. This may include working on speech-language or memory exercises at home, or helping with daily tasks and errands. If you have cognitive disability, you may   be at risk for injury or accidents at home, such as forgetting to turn off the stove. Friends and family  members can help ensure home safety by taking steps such as getting appliances with automatic shut-off features or storing dangerous objects in a secure place. What else should I know about cognitive rehabilitation after a stroke? Having trouble with memory and problem-solving can make you feel alone. You may also have mood changes, anxiety, or depression after a stroke. It is important to:  Stay connected with others through social groups, online support groups, or your community.  Talk to your friends, family, and caregivers about any emotional problems you are having.  Go to one-on-one or group therapy as suggested by your health care provider.  Stay physically active and exercise as often as suggested by your health care provider. Summary  After a stroke, some people have problems with thinking that affect attention, memory, language, communication, and problem-solving.  Cognitive rehabilitation is a program to help you regain brain function and learn skills to cope with thinking problems.  Rehabilitation cannot completely reverse the effects of a stroke, but it can help to improve quality of life.  Cognitive rehabilitation may include speech-language therapy, occupational therapy, music therapy, and physical therapy. This information is not intended to replace advice given to you by your health care provider. Make sure you discuss any questions you have with your health care provider. Document Revised: 10/19/2018 Document Reviewed: 10/02/2016 Elsevier Patient Education  2020 [facility] Discharge After a Stroke  Being discharged from the hospital after a stroke can feel overwhelming. Many things may be different, and it is normal to feel scared or anxious. Some stroke survivors may be able to return to their homes, and others may need more specialized care on a temporary or permanent basis. Your stroke care team will work with you to develop a discharge plan that is best  for you. Ask questions if you do not understand something. Invite a friend or family member to participate in discharge planning. Understanding and following your discharge plan can help to prevent another stroke or other problems. Understanding your medicines After a stroke, your health care provider may prescribe one or more types of medicine. It is important to take medicines exactly as told by your health care provider. Serious harm, such as another stroke, can happen if you are unable to take your medicine exactly as prescribed. Make sure you understand:  What medicine to take.  Why you are taking the medicine.  How and when to take it.  If it can be taken with your other medicines and herbal supplements.  Possible side effects.  When to call your health care provider if you have any side effects.  How you will get and pay for your medicines. Medical assistance programs may be able to help you pay for prescription medicines if you cannot afford them. If you are taking an anticoagulant, be sure to take it exactly as told by your health care provider. This type of medicine can increase the risk of bleeding because it works to prevent blood from clotting. You may need to take certain precautions to prevent bleeding. You should contact your health care provider if you have:  Bleeding or bruising.  A fall or other injury to your head.  Blood in your urine or stool (feces). Planning for home safety  Take steps to prevent falls, such as installing grab bars or using a shower chair. Ask a friend  or family member to get needed things in place before you go home if possible. A therapist can come to your home to make recommendations for safety equipment. Ask your health care provider if you would benefit from this service or from home care. Getting needed equipment Ask your health care provider for a list of any medical equipment and supplies you will need at home. These may include items such  as:  Walkers.  Canes.  Wheelchairs.  Hand-strengthening devices.  Special eating utensils. Medical equipment can be rented or purchased, depending on your insurance coverage. Check with your insurance company about what is covered. Keeping follow-up visits After a stroke, you will need to follow up regularly with a health care provider. You may also need rehabilitation, which can include physical therapy, occupational therapy, or speech-language therapy. Keeping these appointments is very important to your recovery after a stroke. Be sure to bring your medicine list and discharge papers with you to your appointments. If you need help to keep track of your schedule, use a calendar or appointment reminder. Preventing another stroke Having a stroke puts you at risk for another stroke in the future. Ask your health care provider what actions you can take to lower the risk. These may include:  Increasing how much you exercise.  Making a healthy eating plan.  Quitting smoking.  Managing other health conditions, such as high blood pressure, high cholesterol, or diabetes.  Limiting alcohol use. Knowing the warning signs of a stroke  Make sure you understand the signs of a stroke. Before you leave the hospital, you will receive information outlining the stroke warning signs. Share these with your friends and family members. "BE FAST" is an easy way to remember the main warning signs of a stroke:  B - Balance. Signs are dizziness, sudden trouble walking, or loss of balance.  E - Eyes. Signs are trouble seeing or a sudden change in vision.  F - Face. Signs are sudden weakness or numbness of the face, or the face or eyelid drooping on one side.  A - Arms. Signs are weakness or numbness in an arm. This happens suddenly and usually on one side of the body.  S - Speech. Signs are sudden trouble speaking, slurred speech, or trouble understanding what people say.  T - Time. Time to call  emergency services. Write down what time symptoms started. Other signs of stroke may include:  A sudden, severe headache with no known cause.  Nausea or vomiting.  Seizure. These symptoms may represent a serious problem that is an emergency. Do not wait to see if the symptoms will go away. Get medical help right away. Call your local emergency services (911 in the [place] Do not drive yourself to the hospital. Make note of the time that you had your first symptoms. Your emergency responders or emergency room staff will need to know this information. Summary  Being discharged from the hospital after a stroke can feel overwhelming. It is normal to feel scared or anxious.  Make sure you take medicines exactly as told by your health care provider.  Know the warning signs of a stroke, and get help right way if you have any of these symptoms. "BE FAST" is an easy way to remember the main warning signs of a stroke. This information is not intended to replace advice given to you by your health care provider. Make sure you discuss any questions you have with your health care provider. Document Revised: 03/22/2019 Document  Reviewed: 10/02/2016 Elsevier Patient Education  2020 [place] After a Stroke, Adult A stroke causes damage to the brain cells, which can affect your ability to walk, talk, or remember things. The impact of a stroke is different for everyone, and so is recovery. Some people have progress during the first few days after treatment. Others may take weeks or longer to make progress. Stroke rehabilitation includes a variety of treatments to help you recover and promote your independence after a stroke. You may not be able do everything that you did before the stroke, but you can learn ways to manage your lifestyle and be as independent as possible. Rehabilitation will start as soon as you are able to participate after your stroke, and it involves care from a team that  may include:  Family and friends. Your loved ones know you best and can be very helpful in your recovery.  Physicians.  Nurses.  Physical and occupational therapists.  Speech-language therapists.  A nutritionist.  A psychologist.  A Education officer, museum. Keep open communication with all members of your care team. Share your medical records if needed, and take notes about each provider's recommendations. What is physical therapy? Physical therapists (PTs) help you to improve your coordination, balance, and muscle strength. Physical therapy may involve:  Range of motion exercises.  Help to move between lying, sitting, and standing positions.  Walking with a cane or walker, if needed.  Help using stairs. What is occupational therapy? Occupational therapists (OTs) help you rebuild your ability to do everyday tasks, such as brushing your teeth, going to the bathroom, eating, and getting dressed. Occupational therapy may also help with:  Vision. Visual scanning is a technique that is used to prevent falls.  Memory and cognitive training. This therapy includes problem-solving techniques and relearning tasks like making a phone call.  Fine muscle movements such as buttoning a shirt or picking up small objects. What is speech therapy? Speech-language therapists help you communicate. After a stroke, you may have problems understanding what people are saying, or you may have trouble writing, speaking, or finding the right word for what you want to say. You may also need speech therapy if you have difficulty swallowing while eating and drinking. Examples of speech-language therapies include:  Techniques to strengthen muscles used in swallowing.  Naming objects or describing pictures. This helps retrain the brain to recognize and remember words.  Exercises to strengthen the muscles involved in talking, including your tongue and lips.  Exercises to retrain your brain in understanding what you  read and hear. How often will I need therapy? Therapy will begin as soon as you are able to participate, which is often within the first few days after a stroke. Sessions will be frequent at first. For example, you may have therapy 2-3 hours a day on most days of the week during the first few months. The intensity depends on the type and severity of your stroke. You may need therapy for several months. Therapy may take place in the hospital, at a rehabilitation center, or in your home. Are there any side effects of therapy? Therapy is safe and is usually well-tolerated. You may feel physically and mentally tired after therapy, especially during the first few weeks. Rest before therapy sessions if you need to so you can get the most out of your rehabilitation. Follow these instructions at home:  Involve your family and friends in your recovery, if possible. Having another person to encourage you is  beneficial.  Follow instructions from your speech-language therapist, nutritionist, or health care provider about what you can safely eat and drink. Eat healthy foods. If your ability to swallow was affected by the stroke, you may need to take steps to avoid choking, such as: ? Taking small bites when eating. ? Eating foods that are soft or pureed. ? Drinking liquids that have been thickened.  Maintain social connections and interactions with friends, family, and community groups. This is an important part of your recovery. Communication challenges and physical challenges may cause you to feel isolated after a stroke.  Consider joining a support group that allows you to talk about the impact of stroke on your life. A psychologist or counselor may be recommended. Your emotional recovery from stroke is just as important as your physical recovery.  Keep all follow-up visits as told by your health care providers. This is important. Summary  Stroke rehabilitation includes a variety of treatments to help you  recover and promote your independence after a stroke.  Rehabilitation will start as soon as you are able to participate after your stroke, and it includes care from a team of experts.  The intensity of therapy depends on the type and severity of your stroke. You may need therapy for several months. This information is not intended to replace advice given to you by your health care provider. Make sure you discuss any questions you have with your health care provider. Document Revised: 10/19/2018 Document Reviewed: 06/30/2016 Elsevier Patient Education  2020 [place] After Stroke A stroke causes damage to the brain cells, which can affect your ability to walk, talk, and even eat. The impact of a stroke is different for everyone, and so is recovery. A good nutrition plan is important for your recovery. It can also lower your risk of another stroke. If you have difficulty chewing and swallowing your food, a dietitian or your stroke care team can help so that you can enjoy eating healthy foods. What are tips for following this plan?  Reading food labels  Choose foods that have less than 300 milligrams (mg) of sodium per serving. Limit your sodium intake to less than 1,500 mg per day.  Avoid foods that have saturated fat and trans fat.  Choose foods that are low in cholesterol. Limit the amount of cholesterol you eat each day to less than 200 mg.  Choose foods that are high in fiber. Eat 20-30 grams (g) of fiber each day.  Avoid foods with added sugar. Check the food label for ingredients such as sugar, corn syrup, honey, fructose, molasses, and cane juice. Shopping  At the grocery store, buy most of your food from areas near the walls of the store. This includes: ? Fresh fruits and vegetables. ? Dry grains, beans, nuts, and seeds. ? Fresh seafood, poultry, lean meats, and eggs. ? Low-fat dairy products.  Buy whole ingredients instead of prepackaged foods.  Buy  fresh, in-season fruits and vegetables from local farmers markets.  Buy frozen fruits and vegetables in resealable bags. Cooking  Prepare foods with very little salt. Use herbs or salt-free spices instead.  Cook with heart-healthy oils, such as olive, avocado, canola, soybean, or sunflower oil.  Avoid frying foods. Bake, grill, or broil foods instead.  Remove visible fat and skin from meat and poultry before eating.  Modify food textures as told by your health care provider. Meal planning  Eat a wide variety of colorful fruits and vegetables. Make sure  one-half of your plate is filled with fruits and vegetables at each meal.  Eat fruits and vegetables that are high in potassium, such as: ? Apples, bananas, oranges, and melon. ? Sweet potatoes, spinach, zucchini, and tomatoes.  Eat fish that contain heart-healthy fats (omega-3 fats) at least twice a week. These include salmon, tuna, mackerel, and sardines.  Eat plant foods that are high in omega-3 fats, such as flaxseeds and walnuts. Add these to cereals, yogurt, or pasta dishes.  Eat several servings of high-fiber foods each day, such as fruits, vegetables, whole grains, and beans.  Do not put salt at the table for meals.  When eating out at restaurants: ? Ask the server about low-salt or salt-free food options. ? Avoid fried foods. Look for menu items that are grilled, steamed, broiled, or roasted. ? Ask if your food can be prepared without butter. ? Ask for condiments, such as salad dressings, gravy, or sauces to be served on the side.  If you have difficulty swallowing: ? Choose foods that are softer and easier to chew and swallow. ? Cut foods into small pieces and chew well before swallowing. ? Thicken liquids as told by your health care provider or dietitian. ? Let your health care provider know if your condition does not improve over time. You may need to work with a speech therapist to re-train the muscles that are used  for eating. General recommendations  Involve your family and friends in your recovery, if possible. It may be helpful to have a slower meal time and to plan meals that include foods everyone in the family can eat.  Brush your teeth with fluoride toothpaste twice a day, and floss once a day. Keeping a clean mouth can help you swallow and can also help your appetite.  Drink enough water each day to keep your urine pale yellow. If needed, set reminders or ask your family to help you remember to drink water.  Limit alcohol intake to no more than 1 drink a day for nonpregnant women and 2 drinks a day for men. One drink equals 12 oz of beer, 5 oz of wine, or 1 oz of hard liquor. Summary  Following this eating plan can help in your stroke recovery and can decrease your risk for another stroke.  Let your health care provider know if you have problems with swallowing. You may need to work with a speech therapist. This information is not intended to replace advice given to you by your health care provider. Make sure you discuss any questions you have with your health care provider. Document Revised: 10/20/2018 Document Reviewed: 09/06/2017 Elsevier Patient Education  [place].   Physical Therapy After a Stroke After a stroke, some people experience physical changes or problems. Physical therapy may be prescribed to help you recover and overcome problems such as:  Inability to move (paralysis) or weakness, typically affecting one side of the body.  Trouble with balance.  Pain, a pins and needles sensation, or numbness in certain parts of the body. You may also have difficulty feeling touch, pressure, or changes in temperature.  Involuntary muscle tightening (spasticity).  Stiffness in muscles and joints.  Altered coordination and reflexes. What causes physical disability after a stroke? A stroke can damage parts of your brain that control your body's normal functions, including  your ability to move and to keep your balance. The types of physical problems you have will depend on how severe the stroke was and where it was  located in the brain. Weakness or paralysis may affect just your fingers and hands, a whole leg or arm, or an entire side of your body. What is physical therapy? Physical therapy involves using exercises, stretches, and activities to help you regain movement and independence after your stroke. Physical therapy may focus on one or more of the following:  Range of motion. This can help with movement and reduce muscle stiffness.  Balance. This helps to lower your risk of falling.  Position changes or transfers, such as moving from sitting to standing or from a chair to a bed.  Coordination, such as getting an object from a shelf.  Muscle strength. Muscles may be strengthened with weights or by repeating certain motions.  Functional mobility. This may include stair training or learning how to use a wheelchair, walker, or cane.  Walking (gait training).  Activities of daily living, such as getting out of the car or buttoning a shirt. Why is physical therapy important? It is important to do exercises and follow your rehabilitation plan as told by your physical therapist. Physical therapy can:  Help you regain independence.  Prevent injury from falls by building strength and balance.  Lower your risk of blood clots.  Lower your risk of skin sores (pressure injuries).  Increase physical activity and exercise. This may help lower your risk for another stroke.  Help reduce pain. When will therapy start and where will I have therapy? Your health care provider will decide when it is best for you to start therapy. In some cases, people start rehabilitation, including physical therapy, as soon as they are medically stable, which may be 24-48 hours after a stroke. Rehabilitation can take place in a few different places, based on your needs. It may take  place in:  The hospital or an in-patient rehabilitation hospital.  An outpatient rehabilitation facility.  A long-term care facility.  A community rehabilitation clinic.  Your home. What are assistive devices? Assistive devices are tools to help you move, maintain balance, and manage daily tasks while recovering from a stroke. Your physical therapist may recommend and help you learn to use:  Equipment to help you move, such as wheelchairs, canes, or walkers.  Braces or splints to keep your arms, hands, legs, or feet in a comfortable and safe position.  Bathtub benches or grab bars to keep you safe in the bathroom.  Special utensils, bowls, and plates that allow you to eat with one hand. It is important to use these devices as told by your health care provider. Summary  After a stroke, some people may experience physical disabilities, such as weakness or paralysis, pain, or balance problems.  Physical therapy involves exercises, stretches, and activities that help to improve your ability to move and to handle daily tasks.  Physical therapy exercises focus on restoring range of motion, balance, coordination, muscle strength, and the ability to move (mobility).  Physical therapy can help you regain independence, prevent falls, and allow you to live a more active lifestyle after a stroke. This information is not intended to replace advice given to you by your health care provider. Make sure you discuss any questions you have with your health care provider. Document Revised: 10/20/2018 Document Reviewed: 10/05/2016 Elsevier Patient Education  [place].   Sensory Loss After a Stroke A stroke can damage parts of your brain that control your body's normal functions, including your senses. As a result, you may have sensory loss, such as trouble seeing, tasting, swallowing,  or feeling touch or pressure sensations. You may have problems feeling temperature changes or moving your  body in a coordinated way. You may also perceive smell differently. You may have problems with all of your senses or only some of them. By following a treatment plan, you may recover lost senses and manage the changes to your lifestyle. What are treatment therapies for sensory loss? You may have a combination of therapies for sensory loss.  Physical therapy. This may include: ? Exercises to improve your coordination and balance. ? Exercises that combine touch, balance, and movement (sensorimotor training). ? Movements to relieve pressure while you are sitting or lying (mobility training). ? Splints or braces to protect parts of your body that you cannot feel.  Devices to help with blood flow (circulation) and to help stimulate nerves in affected parts of your body.  Speech therapy to help you swallow safely.  Occupational therapy to help you with everyday tasks. This may include: ? Exercises or devices to improve your vision. ? Exercises to help you increase your touch perception. These may include feeling objects of different sizes and textures while your eyes are closed. ? Techniques for moving safely in your environment.  Prescription eye glasses for vision loss.  Hearing aids for hearing loss. Follow these instructions at home: Safety   Your risk of falling is higher after a stroke. You may have difficulty feeling your legs and feet or coordinating your movements. To lower your risk of falling: ? Use devices to help you move around (assistive devices), such as a wheelchair or walker, as directed by your health care team. ? Wear prescription eye glasses at all times when moving around. ? Use lights to help you see in the dark. ? Use grab bars in bathrooms and handrails in stairways. ? Keep walkways clear in your home by removing rugs, cords, and clutter from the floor.  Your risk of getting burned is higher after a stroke. To lower your risk of burns: ? Test the water  temperature before taking a bath or washing your hands. ? Allow hot foods to cool slightly before eating. ? Use potholders when handling hot pans.  When using sharp objects, such as scissors or knives, use your healthy hand. Do not handle sharp objects with your hand that was affected by your stroke, if this applies. Activity  Return to your normal activities as told by your health care provider. Ask your health care provider what activities are safe for you.  Avoid spending too much time sitting or lying down. If you must be in a chair or bed, change positions regularly.  You may have problems doing your normal activities. Ask your health care provider about getting extra help at home. Eating and drinking   You may have problems swallowing food and fluids after a stroke. The problems can be due to: ? Changes in your muscles. ? Sensory changes, such as:  Difficulty feeling the consistency or size of a piece of food in your mouth.  Inability to feel the need to clear your throat.  You may need to: ? Take smaller bites and chew thoroughly. Make sure you have swallowed all the food in your mouth before you take another bite. ? Sit in an upright position when eating or drinking. ? Avoid distractions while eating or drinking. ? Stay upright for 30-45 minutes after eating. ? Change the texture of some things that you eat and drink. This may include:  Changing foods to a  smooth, mashed consistency (puree).  Thickening liquids.  Follow instructions from your health care provider about eating and drinking restrictions. General instructions  Wear arm or leg braces as told by your health care team.  Get help at home as needed. You may need help getting dressed, bathing, using the bathroom, eating, or doing other activities.  Keep all follow-up visits as told by your health care providers. This is important. Summary  It is common to have sensory loss after a stroke. Sensory loss means  that you have problems with some or all of your senses, such as vision, taste, hearing, smell, and touch.  Sensory loss happens because of damage to your brain and nervous system after a stroke.  Treatment for sensory loss may include physical, occupational, or speech therapy, and the use of assistive devices.  You may need to make changes to your home and lifestyle after a stroke to help you live safely and independently. This information is not intended to replace advice given to you by your health care provider. Make sure you discuss any questions you have with your health care provider. Document Revised: 10/21/2018 Document Reviewed: 10/16/2016 Elsevier Patient Education  [place].

## 2020-06-18 NOTE — Progress Notes (Signed)
NEUROLOGY CONSULTATION PROGRESS NOTE   Date of service: June 18, 2020 Patient Name: Joyce Evans MRN:  [id number] DOB:  May 07, 1941  Brief HPI  Joyce Evans is a 79 y.o. female with PMH significant for Afibb with eliquis on hold for 3 days due to a dental procedure, hx of HTN who presents with an episode of aphasia, dysarthria, R facial droop that self resolved.  Unable to get MRI Brain due to retained pacer wires. 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 Left greater than Right cervical carotid calcified plaque with up to 50% stenosis at the Left ICA origin. TTE with EF of 60 to 65%.  Labs with LDL of 122 and hemoglobin A1c of 5.4.  Interval Hx   Will get repeat CTH, if negative, can be discharged home.  Vitals   Vitals:   06/18/20 0038 06/18/20 0112 06/18/20 0434 06/18/20 0734  BP: (!) 155/68 (!) 162/52 (!) 152/65 (!) 155/77  Pulse: (!) 51 (!) 52 (!) 52 (!) 56  Resp: 18 18 18 18   Temp: 98.3 F (36.8 C) 98.1 F (36.7 C) 98.4 F (36.9 C) 97.8 F (36.6 C)  TempSrc: Oral Oral  Oral  SpO2: 100% 100% 100% 100%  Weight:      Height:         Body mass index is 27.44 kg/m.  Physical Exam   General: Laying comfortably in bed; in no acute distress.  HENT: Normal oropharynx and mucosa. Normal external appearance of ears and nose.  Neck: Supple, no pain or tenderness  CV: No JVD. No peripheral edema.  Pulmonary: Symmetric Chest rise. Normal respiratory effort.  Abdomen: Soft to touch, non-tender.  Ext: No cyanosis, edema, or deformity  Skin: No rash. Normal palpation of skin.   Musculoskeletal: Normal digits and nails by inspection. No clubbing.   Neurologic Examination  Mental status/Cognition: Alert, oriented to self, place, month and year, good attention.  Speech/language: Fluent, comprehension intact, object naming intact, repetition intact.  Cranial nerves:   CN II Pupils equal and reactive to light, no  VF deficits    CN III,IV,VI EOM intact, no gaze preference or deviation, no nystagmus    CN V normal sensation in V1, V2, and V3 segments bilaterally    CN VII no asymmetry, no nasolabial fold flattening    CN VIII normal hearing to speech    CN IX & X normal palatal elevation, no uvular deviation    CN XI 5/5 head turn and 5/5 shoulder shrug bilaterally    CN XII midline tongue protrusion    Motor:  Muscle bulk: normal, tone normal, pronator drift none tremor none Mvmt Root Nerve  Muscle Right Left Comments  SA C5/6 Ax Deltoid 5 5   EF C5/6 Mc Biceps 5 5   EE C6/7/8 Rad Triceps 5 5   WF C6/7 Med FCR 5 5   WE C7/8 PIN ECU 5 5   F Ab C8/T1 U ADM/FDI 5 5   HF L1/2/3 Fem Illopsoas 5 5   KE L2/3/4 Fem Quad 5 5   DF L4/5 D Peron Tib Ant 5 5   PF S1/2 Tibial Grc/Sol 5 5    Reflexes:  Right Left Comments  Pectoralis      Biceps (C5/6) 1 1   Brachioradialis (C5/6) 1 1    Triceps (C6/7) 1 1    Patellar (L3/4) 1 1    Achilles (S1)  Hoffman      Plantar     Jaw jerk    Sensation:  Light touch Intact throughout.   Pin prick    Temperature    Vibration   Proprioception    Coordination/Complex Motor:  - Finger to Nose intact BL - Heel to shin intact BL - Rapid alternating movement are normal - Gait: deferred  Labs   Basic Metabolic Panel:  Lab Results  Component Value Date   NA 140 06/17/2020   K 3.8 06/17/2020   CO2 27 06/17/2020   GLUCOSE 121 (H) 06/17/2020   BUN 18 06/17/2020   CREATININE 0.79 06/17/2020   CALCIUM 9.7 06/17/2020   GFRNONAA >60 06/17/2020   HbA1c:  Lab Results  Component Value Date   HGBA1C 5.4 06/17/2020   LDL:  Lab Results  Component Value Date   LDLCALC 122 (H) 06/18/2020   Urine Drug Screen: No results found for: LABOPIA, COCAINSCRNUR, LABBENZ, AMPHETMU, THCU, LABBARB  Alcohol Level No results found for: ETH No results found for: PHENYTOIN, ZONISAMIDE, LAMOTRIGINE, LEVETIRACETA No results found for: PHENYTOIN, PHENOBARB,  VALPROATE, CBMZ  Imaging and Diagnostic studies   CTH: CTH was negative for a large hypodensity concerning for a large territory infarct or hyperdensity concerning for an ICH  CT angio head and neck: calcified ICA siphon plaque resulting in 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 Left greater than Right cervical carotid calcified plaque with up to 50% stenosis at the Left ICA origin.  TTE with EF of 60 to 65%.    Impression   Joyce Evans is a 79 y.o. female with PMH significant for Afibb with eliquis on hold for 3 days due to a dental procedure, hx of HTN who presents with an episode of aphasia, dysarthria, R facial droop that self resolved. Likely episode was cardioembolic in the setting of holding off on Eliquis.  Recommendations  - Follow up with stroke clinic with [facility] preferably - Started on Atorvastatin 80mg  daily for elevated LDL and noted carotid plaques. - Repeat CTH today was negative for any acute large territory stroke. - Recommend holding off on the dental procedure for atleast 3 weeks. Would recommend follow up with stroke clinic and clearance prior to holding off Eliquis for any elective procedure. ______________________________________________________________________   Thank you for the opportunity to take part in the care of this patient. If you have any further questions, please contact the neurology consultation attending.  Signed,  [place] Pager Number [phone]

## 2020-06-18 NOTE — Evaluation (Signed)
Physical Therapy Evaluation Patient Details Name: JOHNETTA SLONIKER MRN: [id number] DOB: 1940-11-21 Today's Date: 06/18/2020   History of Present Illness  79 y.o. 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 CT of head report states normal head CT for age.   Clinical Impression  Patient seen for PT evaluation. At baseline, patient is independent with mobility without assistive device and uses 4 L02 with activity. Patient is currently Mod I with all functional mobility and ambulated without assistive device. Patient needed occasional verbal cues to decrease cadence. Patient ambulated with 4 L02 and Sp02 in the high 90's with activity with no shortness of breath noted. Normal static and dynamic sitting/standing balance observed. No apparent acute PT needs at this time as patient is likely at or nearing baseline level of functional mobility. PT will sign off at this time.     Follow Up Recommendations No PT follow up    Equipment Recommendations  None recommended by PT    Recommendations for Other Services       Precautions / Restrictions Precautions Precautions: Fall Restrictions Weight Bearing Restrictions: No      Mobility  Bed Mobility               General bed mobility comments: not assessed as patient sitting up on arrival and post sessio n    Transfers Overall transfer level: Independent               General transfer comment: multiple standing bouts performed from bed   Ambulation/Gait Ambulation/Gait assistance: Modified independent (Device/Increase time) Gait Distance (Feet): 250 Feet Assistive device: None Gait Pattern/deviations: WFL(Within Functional Limits)     General Gait  Details: no loss of balance with ambulation. cues for safety intermittently to decrease cadence   Stairs            Wheelchair Mobility    Modified Rankin (Stroke Patients Only)       Balance Overall balance assessment: Needs assistance Sitting-balance support: Feet supported;No upper extremity supported Sitting balance-Leahy Scale: Normal     Standing balance support: During functional activity;No upper extremity supported Standing balance-Leahy Scale: Normal Standing balance comment: no loss of balance noted with functional activity without assistive device              High level balance activites: Turns;Direction changes High Level Balance Comments: no loss of balance during high level balance activity, Mod I              Pertinent Vitals/Pain Pain Assessment: No/denies pain    Home Living Family/patient expects to be discharged to:: Private residence Living Arrangements: Spouse/significant other Available Help at Discharge: Available 24 hours/day;Family Type of Home: House Home Access:  (one step up )     Home Layout: One level Home Equipment:  (oxygen ) Additional Comments: patient reports she wears 4L02 with activity at baseline (for the past month only)     Prior Function Level of Independence: Independent               Hand Dominance   Dominant Hand: Right    Extremity/Trunk Assessment   Upper Extremity Assessment Upper Extremity Assessment: Overall WFL for tasks assessed    Lower Extremity Assessment Lower Extremity Assessment: RLE deficits/detail;LLE deficits/detail RLE Deficits /  Details: 5/5 strength dorsiflexion, plantarflexion, knee flexion, hip add/abd, knee extension  RLE Sensation: WNL (WNL proprioception (great toe) and light touch ) LLE Deficits / Details: 5/5 strength dorsiflexion, plantarflexion, knee flexion, hip add/abd, knee extension  LLE Sensation: WNL (WNL proprioception (great toe) and light touch )        Communication   Communication: No difficulties  Cognition Arousal/Alertness: Awake/alert Behavior During Therapy: WFL for tasks assessed/performed Overall Cognitive Status: Within Functional Limits for tasks assessed                                        General Comments      Exercises     Assessment/Plan    PT Assessment Patent does not need any further PT services  PT Problem List         PT Treatment Interventions      PT Goals (Current goals can be found in the Care Plan section)  Acute Rehab PT Goals Patient Stated Goal: to go home  PT Goal Formulation: With patient Time For Goal Achievement: 06/18/20 Potential to Achieve Goals: Good    Frequency     Barriers to discharge        Co-evaluation               AM-PAC PT "6 Clicks" Mobility  Outcome Measure Help needed turning from your back to your side while in a flat bed without using bedrails?: None Help needed moving from lying on your back to sitting on the side of a flat bed without using bedrails?: None Help needed moving to and from a bed to a chair (including a wheelchair)?: None Help needed standing up from a chair using your arms (e.g., wheelchair or bedside chair)?: None Help needed to walk in hospital room?: None Help needed climbing 3-5 steps with a railing? : None 6 Click Score: 24    End of Session Equipment Utilized During Treatment: Gait belt;Oxygen Activity Tolerance: Patient tolerated treatment well Patient left: in bed;with call bell/phone within reach;with family/visitor present Nurse Communication: Mobility status PT Visit Diagnosis: Unsteadiness on feet (R26.81)    Time: 3817-7116 PT Time Calculation (min) (ACUTE ONLY): 30 min   Charges:   PT Evaluation $PT Eval Moderate Complexity: 1 Mod PT Treatments $Gait Training: 8-22 mins        Minna Merritts, PT, MPT   Percell Locus 06/18/2020, 10:18 AM

## 2020-06-18 NOTE — Care Management Obs Status (Signed)
[facility] NOTIFICATION   Patient Details  Name: Joyce Evans MRN: [id number] Date of Birth: 01/04/1941   Medicare Observation Status Notification Given:  Yes    Shelbie Hutching, RN 06/18/2020, 10:37 AM

## 2020-06-18 NOTE — TOC Initial Note (Signed)
Transition of Care [facility]) - Initial/Assessment Note    Patient Details  Name: Joyce Evans MRN: [id number] Date of Birth: [birth year]  Transition of Care [facility]) CM/SW Contact:    Shelbie Hutching, RN Phone Number: 06/18/2020, 10:44 AM  Clinical Narrative:                 Patient placed under observation for TIA.  Patient reports that she is back to her baseline with no residual effects.  Patient is from home with her husband and is independent.  Patient does have chronic O2 through [facility].  Patient is current with her PCP, pulmonology, and cardiology.   No discharge needs identified.   Expected Discharge Plan: Home/Self Care Barriers to Discharge: No Barriers Identified   Patient Goals and CMS Choice Patient states their goals for this hospitalization and ongoing recovery are:: Has been having problems with her lungs and is seeing cardiology and pulmonolgy outpatinet- she hopes to have her shortness of breath resolved and her cough gone      Expected Discharge Plan and Services Expected Discharge Plan: Home/Self Care       Living arrangements for the past 2 months: Single Family Home                 DME Arranged: N/A DME Agency: NA       HH Arranged: NA          Prior Living Arrangements/Services Living arrangements for the past 2 months: Single Family Home Lives with:: Spouse Patient language and need for interpreter reviewed:: Yes Do you feel safe going back to the place where you live?: Yes      Need for Family Participation in Patient Care: Yes (Comment) Care giver support system in place?: Yes (comment) Current home services: DME (oxygen through [facility]) Criminal Activity/Legal Involvement Pertinent to Current Situation/Hospitalization: No - Comment as needed  Activities of Daily Living Home Assistive Devices/Equipment: Oxygen ADL Screening (condition at time of admission) Patient's cognitive ability adequate to safely complete daily activities?: Yes Is the  patient deaf or have difficulty hearing?: No Does the patient have difficulty seeing, even when wearing glasses/contacts?: No Does the patient have difficulty concentrating, remembering, or making decisions?: No Patient able to express need for assistance with ADLs?: Yes Does the patient have difficulty dressing or bathing?: Yes Independently performs ADLs?: Yes (appropriate for developmental age) Does the patient have difficulty walking or climbing stairs?: Yes (gets out of breath) Weakness of Legs: None Weakness of Arms/Hands: None  Permission Sought/Granted Permission sought to share information with : Case Manager, Family Supports Permission granted to share information with : Yes, Verbal Permission Granted  Share Information with NAME: Delfino Lovett     Permission granted to share info w Relationship: husband     Emotional Assessment Appearance:: Appears stated age Attitude/Demeanor/Rapport: Engaged Affect (typically observed): Accepting Orientation: : Oriented to Self, Oriented to Place, Oriented to  Time, Oriented to Situation Alcohol / Substance Use: Not Applicable Psych Involvement: No (comment)  Admission diagnosis:  TIA (transient ischemic attack) [G45.9] Slurred speech [R47.81] Stroke [facility]) [I63.9] Patient Active Problem List   Diagnosis Date Noted  . TIA (transient ischemic attack) 06/17/2020  . Gallstones    PCP:  Derinda Late, MD Pharmacy:   [id number] - [place], [place] - [address] [place] [address] Phone: [phone] Fax: [phone]     Social Determinants of Health (SDOH) Interventions    Readmission Risk Interventions No flowsheet data found.

## 2020-06-18 NOTE — Evaluation (Signed)
Occupational Therapy Evaluation Patient Details Name: Joyce Evans MRN: [id number] DOB: Aug 02, 1940 Today's Date: 06/18/2020    History of Present Illness 79 y.o. 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 CT of head report states normal head CT for age.    Clinical Impression   Upon entering the room, pt supine in bed with husband present in room. Pt very pleasant and agreeable to OT evaluation. Pt performing bed mobility without assistance. Pt demonstrating ability to perform rapid alternating movements, opposition, and various other coordination tasks with R UE all WNLs. Pt writing her name and date without issue as well. Pt ambulates to bathroom without use of AD and mod I overall this session. Pt reports symptoms have resolved. OT did provided education to pt regarding energy conservation since she uses up to 4L O2 at baseline. Pt verbalized understanding. No further need for OT intervention at this session. OT will SIGN OFF.  Follow Up Recommendations  No OT follow up    Equipment Recommendations  None recommended by OT       Precautions / Restrictions Precautions Precautions: Fall Restrictions Weight Bearing Restrictions: No      Mobility Bed Mobility Overal bed mobility: Independent        General bed mobility comments: not assessed as patient sitting up on arrival and post sessio n    Transfers Overall transfer level: Modified independent Equipment used: None      General transfer comment: multiple standing bouts performed from bed     Balance Overall balance assessment: Needs assistance Sitting-balance support: Feet supported;No upper extremity supported Sitting balance-Leahy Scale:  Normal     Standing balance support: During functional activity;No upper extremity supported Standing balance-Leahy Scale: Good Standing balance comment: no loss of balance noted with functional activity without assistive device              High level balance activites: Turns;Direction changes High Level Balance Comments: no loss of balance during high level balance activity, Mod I            ADL either performed or assessed with clinical judgement   ADL Overall ADL's : Modified independent      General ADL Comments: Pt demonstrating mod I for self care and functional mobility secondary to needing increased time but no use of AD to complete task and no cuing for safety awareness     Vision Patient Visual Report: No change from baseline              Pertinent Vitals/Pain Pain Assessment: No/denies pain     Hand Dominance Right   Extremity/Trunk Assessment Upper Extremity Assessment Upper Extremity Assessment: Overall WFL for tasks assessed   Lower Extremity Assessment Lower Extremity Assessment: Overall WFL for tasks assessed RLE Deficits / Details: 5/5 strength dorsiflexion, plantarflexion, knee flexion, hip add/abd, knee extension  RLE Sensation: WNL (WNL proprioception (great toe) and light touch ) LLE Deficits / Details: 5/5 strength dorsiflexion, plantarflexion, knee flexion, hip add/abd, knee extension  LLE Sensation: WNL (WNL proprioception (great toe) and light touch )   Cervical / Trunk Assessment Cervical / Trunk Assessment: Normal   Communication Communication Communication: No difficulties   Cognition Arousal/Alertness: Awake/alert Behavior During Therapy: WFL for tasks assessed/performed Overall Cognitive Status: Within Functional Limits  for tasks assessed                      Home Living Family/patient expects to be discharged to:: Private residence Living Arrangements: Spouse/significant other Available Help at Discharge: Available  24 hours/day;Family Type of Home: House Home Access: Other (comment) (threshold)     Home Layout: One level     Bathroom Shower/Tub: Teacher, early years/pre: Standard     Home Equipment:  (oxygen )   Additional Comments: patient reports she wears 4L02 with activity at baseline (for the past month only)       Prior Functioning/Environment Level of Independence: Independent                          OT Goals(Current goals can be found in the care plan section) Acute Rehab OT Goals Patient Stated Goal: to go home  OT Goal Formulation: With patient Time For Goal Achievement: 07/02/20 Potential to Achieve Goals: Good   AM-PAC OT "6 Clicks" Daily Activity     Outcome Measure Help from another person eating meals?: None Help from another person taking care of personal grooming?: None Help from another person toileting, which includes using toliet, bedpan, or urinal?: None Help from another person bathing (including washing, rinsing, drying)?: None Help from another person to put on and taking off regular upper body clothing?: None Help from another person to put on and taking off regular lower body clothing?: None 6 Click Score: 24   End of Session Equipment Utilized During Treatment: Oxygen Nurse Communication: Mobility status  Activity Tolerance: Patient tolerated treatment well Patient left: with family/visitor present                   Time: 1100-1119 OT Time Calculation (min): 19 min Charges:  OT General Charges $OT Visit: 1 Visit OT Evaluation $OT Eval Low Complexity: 1 Low OT Treatments $Self Care/Home Management : 8-22 mins  Darleen Crocker, MS, OTR/L , CBIS ascom [phone]  06/18/20, 11:28 AM

## 2020-06-19 NOTE — Discharge Summary (Addendum)
Physician Discharge Summary  Joyce Evans DJM:[id number] DOB: [birth year] DOA: 06/17/2020  PCP: Derinda Late, MD  Admit date: 06/17/2020 Discharge date: 06/19/2020  Admitted From: Home Disposition:  Home  Recommendations for Outpatient Follow-up:  1. Follow up with her PCP in 1 week. 2. Follow up with Neurologist at [facility] in 1 week. 3. Follow up with her multiple other physicians as needed.  Home Health: No Equipment/Devices: None  Discharge Condition: Stable CODE STATUS: Full Diet recommendation: Heart Healthy  Brief/Interim Summary: Ms. 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  Initial Head CT showed no acute abnormality.  CTA showed moderate to severe right supraclinoid stenosis, mid to moderate left siphon stenosis, and left>right up to 50% stenosis of the left ICA.  We were unable to obtain MRI of the brain due to retained pace wires.  After 24 hours, symptoms had completely resolved.  Neurology ordered repeat Head CT that showed no acute abnormality, and recommended patient can be discharged.  Patient is on Eliquis.  I started her on a Statin.  She will follow up with her PCP and establish care with a Neurologist.  I held her Carvedilol due to low heart rate during hospitalization (44-51 beats/min) and mild dizziness.  Patient will follow up with her cardiologist to discuss when to resume this medication.    Discharge Diagnoses:  Active Problems:   TIA (transient ischemic attack)    Discharge Instructions  Discharge Instructions    Diet - low sodium heart healthy   Complete by: As directed    Increase activity slowly   Complete by: As directed      Allergies  as of 06/18/2020      Reactions   Other Itching   Phlebitis and itching at vein sites   Epinephrine Palpitations   Heart races   Prevacid [lansoprazole] Palpitations   Heart races      Medication List    STOP taking these medications   carvedilol 12.5 MG tablet Commonly known as: COREG     TAKE these medications   acetaminophen 500 MG tablet Commonly known as: TYLENOL Take 500 mg by mouth every 6 (six) hours as needed.   amLODipine 5 MG tablet Commonly known as: NORVASC Take 5 mg by mouth daily.   atorvastatin 40 MG tablet Commonly known as: LIPITOR Take 1 tablet (40 mg total) by mouth daily.   azelastine 0.1 % nasal spray Commonly known as: ASTELIN Place 2 sprays into both nostrils 2 (two) times daily.   brimonidine 0.1 % Soln Commonly known as: ALPHAGAN P Place 1 drop into the right eye 2 (two) times daily.   cetirizine 10 MG tablet Commonly known as: ZYRTEC Take 10 mg by mouth daily.   Eliquis 5 MG Tabs tablet Generic drug: apixaban Take 5 mg by mouth 2 (two) times daily.   fluticasone 50 MCG/ACT nasal spray Commonly known as: FLONASE Place 2 sprays into the nose daily.   furosemide 20 MG tablet Commonly known as: LASIX Take 20 mg by mouth daily.   losartan 100 MG tablet Commonly known as: COZAAR Take 100 mg by mouth daily.   multivitamin-iron-minerals-folic acid chewable tablet Chew 1 tablet by mouth daily.  omeprazole 20 MG capsule Commonly known as: PRILOSEC Take 20 mg by mouth 2 (two) times daily. Reported on 08/28/2015   PROBIOTIC DAILY PO Take by mouth daily.   Simbrinza 1-0.2 % Susp Generic drug: Brinzolamide-Brimonidine Place 1 drop into the left eye 2 (two) times daily.   spironolactone 25 MG tablet Commonly known as: ALDACTONE Take 12.5 mg by mouth daily.   timolol 0.5 % ophthalmic solution Commonly known as: BETIMOL Place 1 drop into both eyes daily.   Vitamin D3 75 MCG (3000 UT) Tabs Take 3,000-6,000 Units by mouth 2 (two)  times daily.       Follow-up Information    Derinda Late, MD Follow up.   Specialty: Family Medicine Contact information: [address] [facility] [place] [address] [phone]              Allergies  Allergen Reactions  . Other Itching    Phlebitis and itching at vein sites  . Epinephrine Palpitations    Heart races  . Prevacid [Lansoprazole] Palpitations    Heart races    Consultations:  Neurology  Procedures/Studies: CT ANGIO HEAD W OR WO CONTRAST  Result Date: 06/17/2020 CLINICAL DATA:  79 year old female with slurred speech and right facial droop earlier today, symptoms now resolved. TIA. Retained epicardial pacer leads contraindicating MRI. EXAM: CT ANGIOGRAPHY HEAD AND NECK TECHNIQUE: Multidetector CT imaging of the head and neck was performed using the standard protocol during bolus administration of intravenous contrast. Multiplanar CT image reconstructions and MIPs were obtained to evaluate the vascular anatomy. Carotid stenosis measurements (when applicable) are obtained utilizing NASCET criteria, using the distal internal carotid diameter as the denominator. CONTRAST:  16mL OMNIPAQUE IOHEXOL 350 MG/ML SOLN COMPARISON:  Plain head CT 1138 hours today. FINDINGS: CTA NECK Skeleton: Facet degeneration but otherwise mild for age cervical spine degenerative changes. Mild upper thoracic scoliosis. No acute osseous abnormality identified. Upper chest: Low lung volumes with probable bilateral upper lobe atelectasis. Other neck: Negative. Aortic arch: 3 vessel arch configuration. Mild for age calcified arch atherosclerosis. Right carotid system: Tortuous brachiocephalic artery and mildly tortuous proximal right CCA without stenosis. Calcified plaque at the right ICA origin and bulb not resulting in stenosis. Left carotid system: Minimal calcified plaque at the left CCA origin without stenosis. Tortuous left CCA with a kinked  appearance in the neck. Calcified plaque at the left ICA origin results in up to 50 % stenosis with respect to the distal vessel. Vertebral arteries: Mild plaque in the proximal right subclavian artery and at the right vertebral artery origin without stenosis (series 516, image 146). Non dominant right vertebral artery is diminutive but patent to the skull base without stenosis. Soft and calcified plaque in the proximal left subclavian artery without stenosis. Normal left vertebral artery origin. Dominant left vertebral with tortuous V1 segment. Patent left vertebral to the skull base without stenosis. CTA HEAD Posterior circulation: Dominant left V4 segment primarily supplies the basilar. Non dominant right vertebral is highly diminutive beyond the patent PICA origin. Left AICA appears dominant and patent. No distal vertebral stenosis. Patent basilar artery without stenosis. Patent SCA origins. Fetal left PCA origin. Normal right PCA origin with small right posterior communicating artery. Bilateral PCA branches are patent with mild irregularity, including in the right P2 segment. Anterior circulation: Both ICA siphons are patent. Mild to moderate left supraclinoid stenosis due to calcified plaque (series 517, image 127). Similar calcified plaque in the right ICA cavernous and  supraclinoid segments with moderate to severe right siphon stenosis just beyond the anterior genu (series 516, image 73). Normal posterior communicating artery origins. Patent carotid termini. Normal MCA and ACA origins. Mildly dominant right A1. Anterior communicating artery and bilateral ACA branches are within normal limits. Left MCA M1 segment is tortuous and patent to the bifurcation without stenosis. Left MCA branches are within normal limits. Right MCA M1 segment is patent to the bifurcation without stenosis. Right MCA branches are within normal limits. Venous sinuses: Patent. Anatomic variants: Dominant left vertebral artery, the right  functionally terminates in PICA. Fetal left PCA origin. Review of the MIP images confirms the above findings IMPRESSION: 1. Negative for large vessel occlusion. 2. 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 Mild to moderate Left siphon stenosis. Left greater than Right cervical carotid calcified plaque with up to 50% stenosis at the Left ICA origin. 3. Dominant Left Vertebral Artery with mild posterior circulation atherosclerosis and no significant stenosis. Electronically Signed   By: Genevie Ann M.D.   On: 06/17/2020 19:05   DG Chest 2 View  Result Date: 06/17/2020 CLINICAL DATA:  Hypertension, atrial fibrillation EXAM: CHEST - 2 VIEW COMPARISON:  None. FINDINGS: Heart size is mildly enlarged. Atherosclerotic calcification of the aortic knob. Prosthetic cardiac valve. Thin curvilinear density at the right heart border and lower right chest wall likely retained epicardial pacing wires. Mildly prominent interstitial markings bilaterally. No focal airspace consolidation. No pleural effusion or pneumothorax. Severe degenerative changes of the left shoulder. Surgical clips in the region of the right axilla. IMPRESSION: 1. Probable retained epicardial pacing wires. 2. Mildly prominent interstitial markings bilaterally, which may reflect bronchitic type lung changes versus mild edema. Electronically Signed   By: Davina Poke D.O.   On: 06/17/2020 14:09   CT HEAD WO CONTRAST  Result Date: 06/18/2020 CLINICAL DATA:  Slurred speech and right facial droop, now resolved. EXAM: CT HEAD WITHOUT CONTRAST TECHNIQUE: Contiguous axial images were obtained from the base of the skull through the vertex without intravenous contrast. COMPARISON:  06/17/2020 FINDINGS: Brain: There is no evidence of an acute infarct, intracranial hemorrhage, mass, midline shift, or extra-axial fluid collection. The ventricles and sulci are within normal limits for age. Hypodensities in  the cerebral white matter bilaterally are unchanged and nonspecific but compatible with mild chronic small vessel ischemic disease. Vascular: Calcified atherosclerosis at the skull base. Skull: No fracture or suspicious osseous lesion. Sinuses/Orbits: Mild mucosal thickening/small mucous retention cysts in the right maxillary sinus. Clear mastoid air cells. Bilateral cataract extraction. Other: None. IMPRESSION: 1. No evidence of acute intracranial abnormality. 2. Mild chronic small vessel ischemic disease. Electronically Signed   By: Logan Bores M.D.   On: 06/18/2020 09:27   CT ANGIO NECK W OR WO CONTRAST  Result Date: 06/17/2020 CLINICAL DATA:  79 year old female with slurred speech and right facial droop earlier today, symptoms now resolved. TIA. Retained epicardial pacer leads contraindicating MRI. EXAM: CT ANGIOGRAPHY HEAD AND NECK TECHNIQUE: Multidetector CT imaging of the head and neck was performed using the standard protocol during bolus administration of intravenous contrast. Multiplanar CT image reconstructions and MIPs were obtained to evaluate the vascular anatomy. Carotid stenosis measurements (when applicable) are obtained utilizing NASCET criteria, using the distal internal carotid diameter as the denominator. CONTRAST:  75mL OMNIPAQUE IOHEXOL 350 MG/ML SOLN COMPARISON:  Plain head CT 1138 hours today. FINDINGS: CTA NECK Skeleton: Facet degeneration but otherwise mild for age cervical spine degenerative changes. Mild  upper thoracic scoliosis. No acute osseous abnormality identified. Upper chest: Low lung volumes with probable bilateral upper lobe atelectasis. Other neck: Negative. Aortic arch: 3 vessel arch configuration. Mild for age calcified arch atherosclerosis. Right carotid system: Tortuous brachiocephalic artery and mildly tortuous proximal right CCA without stenosis. Calcified plaque at the right ICA origin and bulb not resulting in stenosis. Left carotid system: Minimal calcified plaque  at the left CCA origin without stenosis. Tortuous left CCA with a kinked appearance in the neck. Calcified plaque at the left ICA origin results in up to 50 % stenosis with respect to the distal vessel. Vertebral arteries: Mild plaque in the proximal right subclavian artery and at the right vertebral artery origin without stenosis (series 516, image 146). Non dominant right vertebral artery is diminutive but patent to the skull base without stenosis. Soft and calcified plaque in the proximal left subclavian artery without stenosis. Normal left vertebral artery origin. Dominant left vertebral with tortuous V1 segment. Patent left vertebral to the skull base without stenosis. CTA HEAD Posterior circulation: Dominant left V4 segment primarily supplies the basilar. Non dominant right vertebral is highly diminutive beyond the patent PICA origin. Left AICA appears dominant and patent. No distal vertebral stenosis. Patent basilar artery without stenosis. Patent SCA origins. Fetal left PCA origin. Normal right PCA origin with small right posterior communicating artery. Bilateral PCA branches are patent with mild irregularity, including in the right P2 segment. Anterior circulation: Both ICA siphons are patent. Mild to moderate left supraclinoid stenosis due to calcified plaque (series 517, image 127). Similar calcified plaque in the right ICA cavernous and supraclinoid segments with moderate to severe right siphon stenosis just beyond the anterior genu (series 516, image 73). Normal posterior communicating artery origins. Patent carotid termini. Normal MCA and ACA origins. Mildly dominant right A1. Anterior communicating artery and bilateral ACA branches are within normal limits. Left MCA M1 segment is tortuous and patent to the bifurcation without stenosis. Left MCA branches are within normal limits. Right MCA M1 segment is patent to the bifurcation without stenosis. Right MCA branches are within normal limits. Venous  sinuses: Patent. Anatomic variants: Dominant left vertebral artery, the right functionally terminates in PICA. Fetal left PCA origin. Review of the MIP images confirms the above findings IMPRESSION: 1. Negative for large vessel occlusion. 2. 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 Mild to moderate Left siphon stenosis. Left greater than Right cervical carotid calcified plaque with up to 50% stenosis at the Left ICA origin. 3. Dominant Left Vertebral Artery with mild posterior circulation atherosclerosis and no significant stenosis. Electronically Signed   By: Genevie Ann M.D.   On: 06/17/2020 19:05   US Carotid Bilateral  Result Date: 06/18/2020 CLINICAL DATA:  Acute stroke symptoms EXAM: BILATERAL CAROTID DUPLEX ULTRASOUND TECHNIQUE: Pearline Cables scale imaging, color Doppler and duplex ultrasound were performed of bilateral carotid and vertebral arteries in the neck. COMPARISON:  None. FINDINGS: Criteria: Quantification of carotid stenosis is based on velocity parameters that correlate the residual internal carotid diameter with NASCET-based stenosis levels, using the diameter of the distal internal carotid lumen as the denominator for stenosis measurement. The following velocity measurements were obtained: RIGHT ICA: 126/29 cm/sec CCA: 37/90 cm/sec SYSTOLIC ICA/CCA RATIO:  1.6 ECA: 146 cm/sec LEFT ICA: 104/20 cm/sec CCA: 240/97 cm/sec SYSTOLIC ICA/CCA RATIO:  0.6 ECA: 152 cm/sec RIGHT CAROTID ARTERY: Minor echogenic shadowing plaque formation. No hemodynamically significant right ICA stenosis, velocity elevation, or turbulent flow. Degree of  narrowing less than 50%. RIGHT VERTEBRAL ARTERY:  Normal antegrade flow LEFT CAROTID ARTERY: Minor carotid atherosclerosis. Slight tortuosity. No hemodynamically significant left ICA stenosis, velocity elevation, turbulent flow. Degree of narrowing also less than 50% by ultrasound criteria. LEFT VERTEBRAL ARTERY:  Normal  antegrade flow IMPRESSION: Mild-to-moderate bilateral carotid atherosclerosis and tortuosity. No hemodynamically significant ICA stenosis. Degree of narrowing less than 50% bilaterally by ultrasound criteria. Patent antegrade vertebral flow bilaterally Electronically Signed   By: Jerilynn Mages.  Shick M.D.   On: 06/18/2020 09:26   MM 3D SCREEN BREAST BILATERAL  Result Date: 05/30/2020 CLINICAL DATA:  Screening. EXAM: DIGITAL SCREENING BILATERAL MAMMOGRAM WITH TOMO AND CAD COMPARISON:  Previous exam(s). ACR Breast Density Category c: The breast tissue is heterogeneously dense, which may obscure small masses. FINDINGS: There are no findings suspicious for malignancy. Images were processed with CAD. IMPRESSION: No mammographic evidence of malignancy. A result letter of this screening mammogram will be mailed directly to the patient. RECOMMENDATION: Screening mammogram in one year. (Code:SM-B-01Y) BI-RADS CATEGORY  1: Negative. Electronically Signed   By: Everlean Alstrom M.D.   On: 05/30/2020 09:14   ECHOCARDIOGRAM COMPLETE  Result Date: 06/17/2020    ECHOCARDIOGRAM REPORT   Patient Name:   Joyce Evans Date of Exam: 06/17/2020 Medical Rec #:  [id number]      Height:       62.0 in Accession #:    [id number]     Weight:       150.0 lb Date of Birth:  1941-05-15     BSA:          1.692 m Patient Age:    79 years       BP:           162/64 mmHg Patient Gender: F              HR:           59 bpm. Exam Location:  [facility] Procedure: 2D Echo, Cardiac Doppler and Color Doppler Indications:     Stroke 434.91 / I163.9  History:         Patient has no prior history of Echocardiogram examinations.                  Arrythmias:Atrial Fibrillation, Signs/Symptoms:Murmur; Risk                  Factors:Hypertension.  Sonographer:     Alyse Low Roar Referring Phys:  [id number] AMY N COX Diagnosing Phys: Bartholome Bill MD IMPRESSIONS  1. Left ventricular ejection fraction, by estimation, is 60 to 65%. The left ventricle has normal function. The  left ventricle has no regional wall motion abnormalities. Left ventricular diastolic parameters were normal.  2. Right ventricular systolic function is normal. The right ventricular size is normal.  3. Left atrial size was mildly dilated.  4. Right atrial size was mildly dilated.  5. The mitral valve is grossly normal. Mild mitral valve regurgitation.  6. The aortic valve was not well visualized. Aortic valve regurgitation is mild. FINDINGS  Left Ventricle: Left ventricular ejection fraction, by estimation, is 60 to 65%. The left ventricle has normal function. The left ventricle has no regional wall motion abnormalities. The left ventricular internal cavity size was normal in size. There is  borderline left ventricular hypertrophy. Left ventricular diastolic parameters were normal. Right Ventricle: The right ventricular size is normal. No increase in right ventricular wall thickness. Right ventricular systolic function is normal. Left Atrium: Left atrial size was mildly  dilated. Right Atrium: Right atrial size was mildly dilated. Pericardium: There is no evidence of pericardial effusion. Mitral Valve: The mitral valve is grossly normal. Mild mitral valve regurgitation. Tricuspid Valve: The tricuspid valve is not well visualized. Tricuspid valve regurgitation is mild. Aortic Valve: The aortic valve was not well visualized. Aortic valve regurgitation is mild. Aortic valve peak gradient measures 11.7 mmHg. Pulmonic Valve: The pulmonic valve was not well visualized. Pulmonic valve regurgitation is trivial. Aorta: The aortic root is normal in size and structure. IAS/Shunts: The interatrial septum was not assessed.  LEFT VENTRICLE PLAX 2D LVIDd:         4.53 cm  Diastology LVIDs:         3.02 cm  LV e' medial:    4.90 cm/s LV PW:         1.23 cm  LV E/e' medial:  26.7 LV IVS:        1.46 cm  LV e' lateral:   11.30 cm/s LVOT diam:     1.70 cm  LV E/e' lateral: 11.6 LVOT Area:     2.27 cm  RIGHT VENTRICLE RV Mid diam:     4.44 cm RV S prime:     9.03 cm/s TAPSE (M-mode): 1.7 cm LEFT ATRIUM             Index       RIGHT ATRIUM           Index LA diam:        4.95 cm 2.93 cm/m  RA Area:     23.30 cm LA Vol (A2C):   95.2 ml 56.28 ml/m RA Volume:   74.20 ml  43.86 ml/m LA Vol (A4C):   82.2 ml 48.59 ml/m LA Biplane Vol: 89.1 ml 52.67 ml/m  AORTIC VALVE                PULMONIC VALVE AV Area (Vmax): 1.75 cm    PV Vmax:        1.28 m/s AV Vmax:        171.00 cm/s PV Peak grad:   6.6 mmHg AV Peak Grad:   11.7 mmHg   RVOT Peak grad: 2 mmHg LVOT Vmax:      132.00 cm/s  AORTA Ao Root diam: 2.60 cm MITRAL VALVE MV Area (PHT): 3.16 cm     SHUNTS MV Decel Time: 240 msec     Systemic Diam: 1.70 cm MV E velocity: 131.00 cm/s Bartholome Bill MD Electronically signed by Bartholome Bill MD Signature Date/Time: 06/17/2020/8:24:53 PM    Final    CT HEAD CODE STROKE WO CONTRAST  Result Date: 06/17/2020 CLINICAL DATA:  Code stroke. Slurred speech. Last seen normal 0900 hours. EXAM: CT HEAD WITHOUT CONTRAST TECHNIQUE: Contiguous axial images were obtained from the base of the skull through the vertex without intravenous contrast. COMPARISON:  None. FINDINGS: Brain: No evidence of atrophy or malformation. No sign of acute large or small vessel infarction by CT. No mass, hemorrhage, hydrocephalus or extra-axial collection. Insignificant punctate calcification at the inferior basal ganglia on the left. Vascular: There is atherosclerotic calcification of the major vessels at the base of the brain. Skull: Negative Sinuses/Orbits: Clear/normal Other: None ASPECTS ([place] Stroke Program Early CT Score) - Ganglionic level infarction (caudate, lentiform nuclei, internal capsule, insula, M1-M3 cortex): 7 - Supraganglionic infarction (M4-M6 cortex): 3 Total score (0-10 with 10 being normal): 10 IMPRESSION: 1. Normal head CT for age. 2. ASPECTS is 10. 3. These results were called by  telephone at the time of interpretation on 06/17/2020 at 11:49 am to provider Dr.  Alferd Patee, Who verbally acknowledged these results. Electronically Signed   By: Nelson Chimes M.D.   On: 06/17/2020 11:50    Subjective:   Discharge Exam: Vitals:   06/18/20 0734 06/18/20 1138  BP: (!) 155/77 139/79  Pulse: (!) 56 (!) 53  Resp: 18 16  Temp: 97.8 F (36.6 C) 97.8 F (36.6 C)  SpO2: 100% 100%   Vitals:   06/18/20 0112 06/18/20 0434 06/18/20 0734 06/18/20 1138  BP: (!) 162/52 (!) 152/65 (!) 155/77 139/79  Pulse: (!) 52 (!) 52 (!) 56 (!) 53  Resp: 18 18 18 16   Temp: 98.1 F (36.7 C) 98.4 F (36.9 C) 97.8 F (36.6 C) 97.8 F (36.6 C)  TempSrc: Oral  Oral   SpO2: 100% 100% 100% 100%  Weight:      Height:        General: Pt is alert, awake, not in acute distress Cardiovascular: RRR, S1/S2 +, did not appreciate a murmur Respiratory: CTA bilaterally, no wheezing, no rhonchi Abdominal: Soft, NT, ND, bowel sounds + Extremities: no edema, no cyanosis   The results of significant diagnostics from this hospitalization (including imaging, microbiology, ancillary and laboratory) are listed below for reference.     Microbiology: Recent Results (from the past 240 hour(s))  Resp Panel by RT-PCR (Flu A&B, Covid) Nasopharyngeal Swab     Status: None   Collection Time: 06/17/20 11:48 AM   Specimen: Nasopharyngeal Swab; Nasopharyngeal(NP) swabs in vial transport medium  Result Value Ref Range Status   SARS Coronavirus 2 by RT PCR NEGATIVE NEGATIVE Final    Comment: (NOTE) SARS-CoV-2 target nucleic acids are NOT DETECTED.  The SARS-CoV-2 RNA is generally detectable in upper respiratory specimens during the acute phase of infection. The lowest concentration of SARS-CoV-2 viral copies this assay can detect is 138 copies/mL. A negative result does not preclude SARS-Cov-2 infection and should not be used as the sole basis for treatment or other patient management decisions. A negative result may occur with  improper specimen collection/handling, submission of specimen  other than nasopharyngeal swab, presence of viral mutation(s) within the areas targeted by this assay, and inadequate number of viral copies(<138 copies/mL). A negative result must be combined with clinical observations, patient history, and epidemiological information. The expected result is Negative.  Fact Sheet for Patients:  EntrepreneurPulse.com.au  Fact Sheet for Healthcare Providers:  IncredibleEmployment.be  This test is no t yet approved or cleared by the [place] FDA and  has been authorized for detection and/or diagnosis of SARS-CoV-2 by FDA under an Emergency Use Authorization (EUA). This EUA will remain  in effect (meaning this test can be used) for the duration of the COVID-19 declaration under Section 564(b)(1) of the Act, 21 U.S.C.section 360bbb-3(b)(1), unless the authorization is terminated  or revoked sooner.       Influenza A by PCR NEGATIVE NEGATIVE Final   Influenza B by PCR NEGATIVE NEGATIVE Final    Comment: (NOTE) The Xpert Xpress SARS-CoV-2/FLU/RSV plus assay is intended as an aid in the diagnosis of influenza from Nasopharyngeal swab specimens and should not be used as a sole basis for treatment. Nasal washings and aspirates are unacceptable for Xpert Xpress SARS-CoV-2/FLU/RSV testing.  Fact Sheet for Patients: EntrepreneurPulse.com.au  Fact Sheet for Healthcare Providers: IncredibleEmployment.be  This test is not yet approved or cleared by the [place] FDA and has been authorized for detection and/or diagnosis of SARS-CoV-2 by FDA  under an Emergency Use Authorization (EUA). This EUA will remain in effect (meaning this test can be used) for the duration of the COVID-19 declaration under Section 564(b)(1) of the Act, 21 U.S.C. section 360bbb-3(b)(1), unless the authorization is terminated or revoked.  Performed at [facility], [address]  [place],  [id number]      Labs: Basic Metabolic Panel: Recent Labs  Lab 06/17/20 1129  NA 140  K 3.8  CL 102  CO2 27  GLUCOSE 121*  BUN 18  CREATININE 0.79  CALCIUM 9.7   Liver Function Tests: Recent Labs  Lab 06/17/20 1129  AST 26  ALT 22  ALKPHOS 61  BILITOT 1.9*  PROT 8.3*  ALBUMIN 4.4   CBC: Recent Labs  Lab 06/17/20 1129  WBC 7.7  NEUTROABS 5.6  HGB 13.2  HCT 40.7  MCV 93.1  PLT 273   CBG: Recent Labs  Lab 06/17/20 1124  GLUCAP 116*   Hgb A1c Recent Labs    06/17/20 1129  HGBA1C 5.4   Lipid Profile Recent Labs    06/17/20 1129 06/18/20 0525  CHOL  --  184  HDL  --  51  LDLCALC  --  122*  TRIG  --  57  CHOLHDL  --  3.6  LDLDIRECT 133.5*  --    Thyroid function studies No results for input(s): TSH, T4TOTAL, T3FREE, THYROIDAB in the last 72 hours.  Invalid input(s): FREET3 Anemia work up No results for input(s): VITAMINB12, FOLATE, FERRITIN, TIBC, IRON, RETICCTPCT in the last 72 hours. Urinalysis No results found for: COLORURINE, APPEARANCEUR, [place], [place], GLUCOSEU, [place], [place], [place], PROTEINUR, UROBILINOGEN, NITRITE, LEUKOCYTESUR Sepsis Labs Invalid input(s): PROCALCITONIN,  WBC,  LACTICIDVEN Microbiology Recent Results (from the past 240 hour(s))  Resp Panel by RT-PCR (Flu A&B, Covid) Nasopharyngeal Swab     Status: None   Collection Time: 06/17/20 11:48 AM   Specimen: Nasopharyngeal Swab; Nasopharyngeal(NP) swabs in vial transport medium  Result Value Ref Range Status   SARS Coronavirus 2 by RT PCR NEGATIVE NEGATIVE Final    Comment: (NOTE) SARS-CoV-2 target nucleic acids are NOT DETECTED.  The SARS-CoV-2 RNA is generally detectable in upper respiratory specimens during the acute phase of infection. The lowest concentration of SARS-CoV-2 viral copies this assay can detect is 138 copies/mL. A negative result does not preclude SARS-Cov-2 infection and should not be used as the sole basis for treatment  or other patient management decisions. A negative result may occur with  improper specimen collection/handling, submission of specimen other than nasopharyngeal swab, presence of viral mutation(s) within the areas targeted by this assay, and inadequate number of viral copies(<138 copies/mL). A negative result must be combined with clinical observations, patient history, and epidemiological information. The expected result is Negative.  Fact Sheet for Patients:  EntrepreneurPulse.com.au  Fact Sheet for Healthcare Providers:  IncredibleEmployment.be  This test is no t yet approved or cleared by the [place] FDA and  has been authorized for detection and/or diagnosis of SARS-CoV-2 by FDA under an Emergency Use Authorization (EUA). This EUA will remain  in effect (meaning this test can be used) for the duration of the COVID-19 declaration under Section 564(b)(1) of the Act, 21 U.S.C.section 360bbb-3(b)(1), unless the authorization is terminated  or revoked sooner.       Influenza A by PCR NEGATIVE NEGATIVE Final   Influenza B by PCR NEGATIVE NEGATIVE Final    Comment: (NOTE) The Xpert Xpress SARS-CoV-2/FLU/RSV plus assay is intended as an aid in the  diagnosis of influenza from Nasopharyngeal swab specimens and should not be used as a sole basis for treatment. Nasal washings and aspirates are unacceptable for Xpert Xpress SARS-CoV-2/FLU/RSV testing.  Fact Sheet for Patients: EntrepreneurPulse.com.au  Fact Sheet for Healthcare Providers: IncredibleEmployment.be  This test is not yet approved or cleared by the [place] FDA and has been authorized for detection and/or diagnosis of SARS-CoV-2 by FDA under an Emergency Use Authorization (EUA). This EUA will remain in effect (meaning this test can be used) for the duration of the COVID-19 declaration under Section 564(b)(1) of the Act, 21 U.S.C. section  360bbb-3(b)(1), unless the authorization is terminated or revoked.  Performed at [facility], [address], [place], [place] [address]     Time coordinating discharge: Over 30 minutes  SIGNED:  George Hugh, MD  [facility] 06/19/2020, 12:17 PM Pager

## 2020-08-12 ENCOUNTER — Encounter: Payer: Medicare PPO | Admitting: Dermatology

## 2020-08-29 ENCOUNTER — Encounter: Payer: Medicare PPO | Attending: Internal Medicine

## 2020-08-29 ENCOUNTER — Other Ambulatory Visit: Payer: Self-pay

## 2020-08-29 DIAGNOSIS — J849 Interstitial pulmonary disease, unspecified: Secondary | ICD-10-CM | POA: Insufficient documentation

## 2020-08-29 NOTE — Progress Notes (Signed)
Virtual Visit completed. Patient informed on EP and RD appointment and 6 Minute walk test. Patient also informed of patient health questionnaires on My Chart. Patient Verbalizes understanding. Visit diagnosis can be found in CHL2/09/2020.

## 2020-09-02 ENCOUNTER — Other Ambulatory Visit: Payer: Self-pay

## 2020-09-02 VITALS — Ht 62.75 in | Wt 176.2 lb

## 2020-09-02 DIAGNOSIS — J849 Interstitial pulmonary disease, unspecified: Secondary | ICD-10-CM | POA: Diagnosis not present

## 2020-09-02 NOTE — Progress Notes (Signed)
Pulmonary Individual Treatment Plan  Patient Details  Name: Joyce Evans MRN: [id number] Date of Birth: [birth year] Referring Provider:   Flowsheet Row Pulmonary Rehab from 09/02/2020 in [facility] Cardiac and Pulmonary Rehab  Referring Provider Kloefkorn      Initial Encounter Date:  Flowsheet Row Pulmonary Rehab from 09/02/2020 in [facility] Cardiac and Pulmonary Rehab  Date 09/02/20      Visit Diagnosis: ILD (interstitial lung disease) ([place])  Patient's Home Medications on Admission:  Current Outpatient Medications:  .  acetaminophen (TYLENOL) 500 MG tablet, Take 500 mg by mouth every 6 (six) hours as needed., Disp: , Rfl:  .  amLODipine (NORVASC) 5 MG tablet, Take 5 mg by mouth daily., Disp: , Rfl:  .  apixaban (ELIQUIS) 5 MG TABS tablet, Take 5 mg by mouth 2 (two) times daily. , Disp: , Rfl:  .  atorvastatin (LIPITOR) 40 MG tablet, Take 1 tablet (40 mg total) by mouth daily., Disp: 30 tablet, Rfl: 3 .  atorvastatin (LIPITOR) 40 MG tablet, Take by mouth., Disp: , Rfl:  .  azelastine (ASTELIN) 0.1 % nasal spray, Place 2 sprays into both nostrils 2 (two) times daily. (Patient not taking: Reported on 08/29/2020), Disp: , Rfl:  .  brimonidine (ALPHAGAN P) 0.1 % SOLN, Place 1 drop into the right eye 2 (two) times daily., Disp: , Rfl:  .  Brinzolamide-Brimonidine 1-0.2 % SUSP, Place 1 drop into the left eye 2 (two) times daily., Disp: , Rfl:  .  cetirizine (ZYRTEC) 10 MG tablet, Take 10 mg by mouth daily. (Patient not taking: Reported on 08/29/2020), Disp: , Rfl:  .  Cholecalciferol (VITAMIN D3) 3000 units TABS, Take 3,000-6,000 Units by mouth 2 (two) times daily. , Disp: , Rfl:  .  Cholecalciferol 50 MCG (2000 UT) CAPS, Take by mouth., Disp: , Rfl:  .  fluticasone (FLONASE) 50 MCG/ACT nasal spray, Place 2 sprays into the nose daily., Disp: , Rfl:  .  furosemide (LASIX) 20 MG tablet, Take 20 mg by mouth daily., Disp: , Rfl:  .  furosemide (LASIX) 20 MG tablet, Take by mouth. (Patient not taking:  Reported on 08/29/2020), Disp: , Rfl:  .  losartan (COZAAR) 100 MG tablet, Take 100 mg by mouth daily., Disp: , Rfl:  .  multivitamin-iron-minerals-folic acid (CENTRUM) chewable tablet, Chew 1 tablet by mouth daily., Disp: , Rfl:  .  omeprazole (PRILOSEC) 20 MG capsule, Take 20 mg by mouth 2 (two) times daily. Reported on 08/28/2015 (Patient not taking: Reported on 08/29/2020), Disp: , Rfl:  .  omeprazole (PRILOSEC) 40 MG capsule, Take by mouth., Disp: , Rfl:  .  Probiotic Product (PROBIOTIC DAILY PO), Take by mouth daily., Disp: , Rfl:  .  spironolactone (ALDACTONE) 25 MG tablet, Take 12.5 mg by mouth daily. (Patient not taking: Reported on 08/29/2020), Disp: , Rfl:  .  timolol (BETIMOL) 0.5 % ophthalmic solution, Place 1 drop into both eyes daily., Disp: , Rfl:  .  Zinc Citrate-Phytase (ZYTAZE) 25-500 MG CAPS, Take by mouth., Disp: , Rfl:   Past Medical History: Past Medical History:  Diagnosis Date  . Arthritis    osteo - shoulders, fingers  . Atrial fibrillation ([place])   . Dizziness    thinks because of diuretic  . Family history of adverse reaction to anesthesia    daughter -PONV  . Gallstones 2016, 2017  . GERD (gastroesophageal reflux disease)   . Glaucoma   . Heart murmur    history of  . Hypertension   .  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 Excised: 05/19/2016, margins free.  . Numbness in left leg    s/p hematoma  . Sciatica    right  . Seasonal allergies   . Skin cancer   . 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 Tricuspid valve disorder    leak    Tobacco Use: Social History   Tobacco Use  Smoking Status Never Smoker  Smokeless Tobacco Never Used    Labs: Recent Review Flowsheet Data    Labs for ITP Cardiac and Pulmonary Rehab Latest Ref Rng & Units 06/17/2020 06/18/2020   Cholestrol 0 - 200 mg/dL - 184   LDLCALC 0 - 99 mg/dL - 122(H)   LDLDIRECT 0 - 99 mg/dL 133.5(H) -   HDL  >40 mg/dL - 51   Trlycerides <150 mg/dL - 57   Hemoglobin A1c 4.8 - 5.6 % 5.4 -       Pulmonary Assessment Scores:  Pulmonary Assessment Scores    Row Name 09/02/20 1106         ADL UCSD   SOB Score total 48     Rest 1     Walk 2     Stairs 4     Bath 0     Dress 1     Shop 3           CAT Score   CAT Score 18           mMRC Score   mMRC Score 2            UCSD: Self-administered rating of dyspnea associated with activities of daily living (ADLs) 6-point scale (0 = "not at all" to 5 = "maximal or unable to do because of breathlessness")  Scoring Scores range from 0 to 120.  Minimally important difference is 5 units  CAT: CAT can identify the health impairment of COPD patients and is better correlated with disease progression.  CAT has a scoring range of zero to 40. The CAT score is classified into four groups of low (less than 10), medium (10 - 20), high (21-30) and very high (31-40) based on the impact level of disease on health status. A CAT score over 10 suggests significant symptoms.  A worsening CAT score could be explained by an exacerbation, poor medication adherence, poor inhaler technique, or progression of COPD or comorbid conditions.  CAT MCID is 2 points  mMRC: mMRC (Modified Medical Research Council) Dyspnea Scale is used to assess the degree of baseline functional disability in patients of respiratory disease due to dyspnea. No minimal important difference is established. A decrease in score of 1 point or greater is considered a positive change.   Pulmonary Function Assessment:  Pulmonary Function Assessment - 08/29/20 0913      Breath   Shortness of Breath Yes;Limiting activity           Exercise Target Goals: Exercise Program Goal: Individual exercise prescription set using results from initial 6 min walk test and THRR while considering  patient's activity barriers and safety.   Exercise Prescription Goal: Initial exercise prescription  builds to 30-45 minutes a day of aerobic activity, 2-3 days per week.  Home exercise guidelines will be given to patient during program as part of exercise prescription that the participant will acknowledge.  Education: Aerobic Exercise: - Group verbal and visual presentation on the components of exercise prescription. Introduces  F.I.T.T principle from ACSM for exercise prescriptions.  Reviews F.I.T.T. principles of aerobic exercise including progression. Written material given at graduation.   Education: Resistance Exercise: - Group verbal and visual presentation on the components of exercise prescription. Introduces F.I.T.T principle from ACSM for exercise prescriptions  Reviews F.I.T.T. principles of resistance exercise including progression. Written material given at graduation.    Education: Exercise & Equipment Safety: - Individual verbal instruction and demonstration of equipment use and safety with use of the equipment. Flowsheet Row Pulmonary Rehab from 09/02/2020 in [facility] Cardiac and Pulmonary Rehab  Date 09/02/20  Educator AS  Instruction Review Code 1- Verbalizes Understanding      Education: Exercise Physiology & General Exercise Guidelines: - Group verbal and written instruction with models to review the exercise physiology of the cardiovascular system and associated critical values. Provides general exercise guidelines with specific guidelines to those with heart or lung disease.  Flowsheet Row Cardiac Rehab from 05/30/2018 in [facility] Cardiac and Pulmonary Rehab  Date 04/06/18  Educator Nada Maclachlan, EP  Instruction Review Code 1- Verbalizes Understanding      Education: Flexibility, Balance, Mind/Body Relaxation: - Group verbal and visual presentation with interactive activity on the components of exercise prescription. Introduces F.I.T.T principle from ACSM for exercise prescriptions. Reviews F.I.T.T. principles of flexibility and balance exercise training including  progression. Also discusses the mind body connection.  Reviews various relaxation techniques to help reduce and manage stress (i.e. Deep breathing, progressive muscle relaxation, and visualization). Balance handout provided to take home. Written material given at graduation. Flowsheet Row Cardiac Rehab from 05/30/2018 in [facility] Cardiac and Pulmonary Rehab  Date 04/18/18  Educator AS  Instruction Review Code 1- Verbalizes Understanding      Activity Barriers & Risk Stratification:   6 Minute Walk:  6 Minute Walk    Row Name 09/02/20 1056         6 Minute Walk   Phase Initial     Distance 950 feet     Walk Time 6 minutes     # of Rest Breaks 0     MPH 1.8     METS 1.95     RPE 12     Perceived Dyspnea  1     VO2 Peak 6.82     Symptoms No     Resting HR 69 bpm     Resting BP 142/68     Resting Oxygen Saturation  95 %     Exercise Oxygen Saturation  during 6 min walk 85 %     Max Ex. HR 111 bpm     Max Ex. BP 164/78     2 Minute Post BP 144/68           Interval HR   1 Minute HR 90     2 Minute HR 81     3 Minute HR 105     4 Minute HR 109     5 Minute HR 111     6 Minute HR 103     2 Minute Post HR 98     Interval Heart Rate? Yes           Interval Oxygen   Interval Oxygen? Yes     Baseline Oxygen Saturation % 95 %     1 Minute Oxygen Saturation % 90 %     1 Minute Liters of Oxygen 4 L  pulsed     2 Minute Oxygen Saturation % 89 %  2 Minute Liters of Oxygen 4 L  pulsed     3 Minute Oxygen Saturation % 88 %     3 Minute Liters of Oxygen 4 L  pulsed     4 Minute Oxygen Saturation % 87 %     4 Minute Liters of Oxygen 4 L  pulsed     5 Minute Oxygen Saturation % 85 %     5 Minute Liters of Oxygen 4 L  pulsed     6 Minute Oxygen Saturation % 85 %     6 Minute Liters of Oxygen 4 L  pulsed     2 Minute Post Oxygen Saturation % 88 %     2 Minute Post Liters of Oxygen 4 L  pulsed           Oxygen Initial Assessment:  Oxygen Initial Assessment - 08/29/20 0911       Home Oxygen   Home Oxygen Device Portable Concentrator;Home Concentrator;E-Tanks    Sleep Oxygen Prescription Continuous   PRN   Liters per minute 2   PRN   Home Exercise Oxygen Prescription Continuous    Liters per minute 2   PRN   Home Resting Oxygen Prescription Continuous    Liters per minute 2   PRN   Compliance with Home Oxygen Use Yes      Initial 6 min Walk   Oxygen Used None      Program Oxygen Prescription   Program Oxygen Prescription None      Intervention   Short Term Goals To learn and exhibit compliance with exercise, home and travel O2 prescription;To learn and understand importance of monitoring SPO2 with pulse oximeter and demonstrate accurate use of the pulse oximeter.;To learn and understand importance of maintaining oxygen saturations>88%;To learn and demonstrate proper pursed lip breathing techniques or other breathing techniques.;To learn and demonstrate proper use of respiratory medications    Long  Term Goals Exhibits compliance with exercise, home and travel O2 prescription;Verbalizes importance of monitoring SPO2 with pulse oximeter and return demonstration;Maintenance of O2 saturations>88%;Exhibits proper breathing techniques, such as pursed lip breathing or other method taught during program session;Compliance with respiratory medication;Demonstrates proper use of MDI's           Oxygen Re-Evaluation:   Oxygen Discharge (Final Oxygen Re-Evaluation):   Initial Exercise Prescription:  Initial Exercise Prescription - 09/02/20 1100      Date of Initial Exercise RX and Referring Provider   Date 09/02/20    Referring Provider Kloefkorn      Treadmill   MPH 1.5    Grade 0    Minutes 15    METs 2      Recumbant Bike   Level 1    RPM 60    Minutes 15    METs 2      NuStep   Level 2    SPM 80    Minutes 15    METs 2      Recumbant Elliptical   Level 1    RPM 50    Minutes 15    METs 2      REL-XR   Level 2    Watts 50     Minutes 15    METs 2      Prescription Details   Frequency (times per week) 3    Duration Progress to 30 minutes of continuous aerobic without signs/symptoms of physical distress      Intensity   THRR 40-80% of Max  Heartrate 98-127    Ratings of Perceived Exertion 11-13      Progression   Progression Continue progressive overload as per policy without signs/symptoms or physical distress.      Resistance Training   Training Prescription Yes    Weight 3 lb    Reps 10-15           Perform Capillary Blood Glucose checks as needed.  Exercise Prescription Changes:  Exercise Prescription Changes    Row Name 09/02/20 1100             Response to Exercise   Blood Pressure (Admit) 142/68       Blood Pressure (Exercise) 164/78       Blood Pressure (Exit) 144/68       Heart Rate (Admit) 69 bpm       Heart Rate (Exercise) 111 bpm       Heart Rate (Exit) 82 bpm       Rating of Perceived Exertion (Exercise) 12       Perceived Dyspnea (Exercise) 1       Symptoms none              Exercise Comments:   Exercise Goals and Review:  Exercise Goals    Row Name 09/02/20 1104             Exercise Goals   Increase Physical Activity Yes       Intervention Provide advice, education, support and counseling about physical activity/exercise needs.;Develop an individualized exercise prescription for aerobic and resistive training based on initial evaluation findings, risk stratification, comorbidities and participant's personal goals.       Expected Outcomes Short Term: Attend rehab on a regular basis to increase amount of physical activity.;Long Term: Add in home exercise to make exercise part of routine and to increase amount of physical activity.;Long Term: Exercising regularly at least 3-5 days a week.       Increase Strength and Stamina Yes       Intervention Provide advice, education, support and counseling about physical activity/exercise needs.;Develop an individualized  exercise prescription for aerobic and resistive training based on initial evaluation findings, risk stratification, comorbidities and participant's personal goals.       Expected Outcomes Short Term: Increase workloads from initial exercise prescription for resistance, speed, and METs.;Short Term: Perform resistance training exercises routinely during rehab and add in resistance training at home;Long Term: Improve cardiorespiratory fitness, muscular endurance and strength as measured by increased METs and functional capacity (6MWT)       Able to understand and use rate of perceived exertion (RPE) scale Yes       Intervention Provide education and explanation on how to use RPE scale       Expected Outcomes Short Term: Able to use RPE daily in rehab to express subjective intensity level;Long Term:  Able to use RPE to guide intensity level when exercising independently       Able to understand and use Dyspnea scale Yes       Intervention Provide education and explanation on how to use Dyspnea scale       Expected Outcomes Short Term: Able to use Dyspnea scale daily in rehab to express subjective sense of shortness of breath during exertion;Long Term: Able to use Dyspnea scale to guide intensity level when exercising independently       Knowledge and understanding of Target Heart Rate Range (THRR) Yes       Intervention Provide education and explanation of  THRR including how the numbers were predicted and where they are located for reference       Expected Outcomes Short Term: Able to use daily as guideline for intensity in rehab;Short Term: Able to state/look up THRR;Long Term: Able to use THRR to govern intensity when exercising independently       Able to check pulse independently Yes       Intervention Provide education and demonstration on how to check pulse in carotid and radial arteries.;Review the importance of being able to check your own pulse for safety during independent exercise       Expected  Outcomes Short Term: Able to explain why pulse checking is important during independent exercise;Long Term: Able to check pulse independently and accurately       Understanding of Exercise Prescription Yes       Intervention Provide education, explanation, and written materials on patient's individual exercise prescription       Expected Outcomes Short Term: Able to explain program exercise prescription;Long Term: Able to explain home exercise prescription to exercise independently              Exercise Goals Re-Evaluation :   Discharge Exercise Prescription (Final Exercise Prescription Changes):  Exercise Prescription Changes - 09/02/20 1100      Response to Exercise   Blood Pressure (Admit) 142/68    Blood Pressure (Exercise) 164/78    Blood Pressure (Exit) 144/68    Heart Rate (Admit) 69 bpm    Heart Rate (Exercise) 111 bpm    Heart Rate (Exit) 82 bpm    Rating of Perceived Exertion (Exercise) 12    Perceived Dyspnea (Exercise) 1    Symptoms none           Nutrition:  Target Goals: Understanding of nutrition guidelines, daily intake of sodium 1500mg , cholesterol 200mg , calories 30% from fat and 7% or less from saturated fats, daily to have 5 or more servings of fruits and vegetables.  Education: All About Nutrition: -Group instruction provided by verbal, written material, interactive activities, discussions, models, and posters to present general guidelines for heart healthy nutrition including fat, fiber, MyPlate, the role of sodium in heart healthy nutrition, utilization of the nutrition label, and utilization of this knowledge for meal planning. Follow up email sent as well. Written material given at graduation. Flowsheet Row Cardiac Rehab from 05/30/2018 in [facility] Cardiac and Pulmonary Rehab  Date 05/16/18  Educator LB  Instruction Review Code 1- Verbalizes Understanding      Biometrics:  Pre Biometrics - 09/02/20 1105      Pre Biometrics   Height 5' 2.75" (1.594  m)    Weight 176 lb 3.2 oz (79.9 kg)    BMI (Calculated) 31.46            Nutrition Therapy Plan and Nutrition Goals:  Nutrition Therapy & Goals - 09/02/20 1020      Nutrition Therapy   Diet Heart healthy, low Na, pulmonary MNT    Drug/Food Interactions Statins/Certain Fruits    Protein (specify units) 80g    Fiber 25 grams    Whole Grain Foods 3 servings    Saturated Fats 12 max. grams    Fruits and Vegetables 8 servings/day    Sodium 1.5 grams      Personal Nutrition Goals   Nutrition Goal ST: Do not add salt to home cooked meals, eat 6 small meals/day LT: limit eating out to 2-3x/week, do not cook with salt, improve breathing with meals.  Comments She has been getting Hello Fresh. She is going on a Cruise soon in March. She believes her problem is portion control. B: egg and cheese wrap - dunkin doughnuts or bran flake cereal with blueberries L: She reports eating out a lot for lunch - taco bell - bean burrito supreme and plain taco, [place] restaurants, red lobster (salmon, brussels sprouts). D: leftovers, hello fresh, or fixes a meal herself - avocado oil, green beans, sea salt, turnip greens, spinach, roasted carrots, brussells sprouts, pork chops, salmon cakes. Drinks: coffee (decaf, caramel macchiado creamer) or green tea and lots of water. Uses benecol instead of butter. She reports liking cheese - havarti, [place], swiss, [place]. She reports eating a lot of peanut butter (natural Jiff). Uses almond milk. She reports eating beans with corn sometimes. She is still strugglin with heartburn. Reviewed heart healthy eating and pulmonary freindly eating. Plan to reduce salt at home, eat smaller -more frequent meals to help with breathing, then reduce frequency of eating out.      Intervention Plan   Intervention Prescribe, educate and counsel regarding individualized specific dietary modifications aiming towards targeted core components such as weight, hypertension, lipid management,  diabetes, heart failure and other comorbidities.;Nutrition handout(s) given to patient.    Expected Outcomes Short Term Goal: Understand basic principles of dietary content, such as calories, fat, sodium, cholesterol and nutrients.;Short Term Goal: A plan has been developed with personal nutrition goals set during dietitian appointment.;Long Term Goal: Adherence to prescribed nutrition plan.           Nutrition Assessments:  MEDIFICTS Score Key:  ?70 Need to make dietary changes   40-70 Heart Healthy Diet  ? 40 Therapeutic Level Cholesterol Diet  Flowsheet Row Pulmonary Rehab from 09/02/2020 in [facility] Cardiac and Pulmonary Rehab  Picture Your Plate Total Score on Admission 77     Picture Your Plate Scores:  <42 Unhealthy dietary pattern with much room for improvement.  41-50 Dietary pattern unlikely to meet recommendations for good health and room for improvement.  51-60 More healthful dietary pattern, with some room for improvement.   >60 Healthy dietary pattern, although there may be some specific behaviors that could be improved.   Nutrition Goals Re-Evaluation:   Nutrition Goals Discharge (Final Nutrition Goals Re-Evaluation):   Psychosocial: Target Goals: Acknowledge presence or absence of significant depression and/or stress, maximize coping skills, provide positive support system. Participant is able to verbalize types and ability to use techniques and skills needed for reducing stress and depression.   Education: Stress, Anxiety, and Depression - Group verbal and visual presentation to define topics covered.  Reviews how body is impacted by stress, anxiety, and depression.  Also discusses healthy ways to reduce stress and to treat/manage anxiety and depression.  Written material given at graduation. Flowsheet Row Cardiac Rehab from 05/30/2018 in [facility] Cardiac and Pulmonary Rehab  Date 04/13/18  Educator [facility]  Instruction Review Code 1- [employer] Understanding       Education: Sleep Hygiene -Provides group verbal and written instruction about how sleep can affect your health.  Define sleep hygiene, discuss sleep cycles and impact of sleep habits. Review good sleep hygiene tips.  Flowsheet Row Cardiac Rehab from 05/30/2018 in [facility] Cardiac and Pulmonary Rehab  Date 03/16/18  Educator [facility]  Instruction Review Code 1- Verbalizes Understanding      Initial Review & Psychosocial Screening:  Initial Psych Review & Screening - 08/29/20 0915      Initial Review   Current issues with Current  Psychotropic Meds;Current Anxiety/Panic    Comments She sometimes is anxious and is a positive person in general.      [place]? Yes    Comments Pranavi can look to her husband and her two daughters for support.      Barriers   Psychosocial barriers to participate in program The patient should benefit from training in stress management and relaxation.      Screening Interventions   Interventions To provide support and resources with identified psychosocial needs;Encouraged to exercise;Provide feedback about the scores to participant    Expected Outcomes Short Term goal: Utilizing psychosocial counselor, staff and physician to assist with identification of specific Stressors or current issues interfering with healing process. Setting desired goal for each stressor or current issue identified.;Long Term Goal: Stressors or current issues are controlled or eliminated.;Short Term goal: Identification and review with participant of any Quality of Life or Depression concerns found by scoring the questionnaire.;Long Term goal: The participant improves quality of Life and PHQ9 Scores as seen by post scores and/or verbalization of changes           Quality of Life Scores:  Scores of 19 and below usually indicate a poorer quality of life in these areas.  A difference of  2-3 points is a clinically meaningful difference.  A difference of 2-3 points  in the total score of the Quality of Life Index has been associated with significant improvement in overall quality of life, self-image, physical symptoms, and general health in studies assessing change in quality of life.  PHQ-9: Recent Review Flowsheet Data    Depression screen [facility] 2/9 09/02/2020 05/30/2018 03/08/2018   Decreased Interest 0 0 1   Down, Depressed, Hopeless 0 0 0   PHQ - 2 Score 0 0 1   Altered sleeping - - 0   Tired, decreased energy 1 1 1    Change in appetite 1 0 0   Feeling bad or failure about yourself  0 0 0   Trouble concentrating 0 0 0   Moving slowly or fidgety/restless 0 0 0   Suicidal thoughts 0 0 0   PHQ-9 Score - - 2   Difficult doing work/chores Not difficult at all - Not difficult at all     Interpretation of Total Score  Total Score Depression Severity:  1-4 = Minimal depression, 5-9 = Mild depression, 10-14 = Moderate depression, 15-19 = Moderately severe depression, 20-27 = Severe depression   Psychosocial Evaluation and Intervention:  Psychosocial Evaluation - 08/29/20 0917      Psychosocial Evaluation & Interventions   Interventions Encouraged to exercise with the program and follow exercise prescription;Relaxation education;Stress management education    Comments She sometimes is anxious and is a positive person in general.Aneesah can look to her husband and her two daughters for support.    Expected Outcomes Short: Exercise regularly to support mental health and notify staff of any changes. Long: maintain mental health and well being through teaching of rehab or prescribed medications independently.    Continue Psychosocial Services  Follow up required by staff           Psychosocial Re-Evaluation:   Psychosocial Discharge (Final Psychosocial Re-Evaluation):   Education: Education Goals: Education classes will be provided on a weekly basis, covering required topics. Participant will state understanding/return demonstration of topics  presented.  Learning Barriers/Preferences:  Learning Barriers/Preferences - 08/29/20 0910      Learning Barriers/Preferences   Learning Barriers None  Learning Preferences None           General Pulmonary Education Topics:  Infection Prevention: - Provides verbal and written material to individual with discussion of infection control including proper hand washing and proper equipment cleaning during exercise session. Flowsheet Row Pulmonary Rehab from 09/02/2020 in [facility] Cardiac and Pulmonary Rehab  Date 09/02/20  Educator As  Instruction Review Code 1- Verbalizes Understanding      Falls Prevention: - Provides verbal and written material to individual with discussion of falls prevention and safety. Flowsheet Row Pulmonary Rehab from 08/29/2020 in [facility] Cardiac and Pulmonary Rehab  Date 08/29/20  Educator [facility]  Instruction Review Code 1- Verbalizes Understanding      Chronic Lung Disease Review: - Group verbal instruction with posters, models, PowerPoint presentations and videos,  to review new updates, new respiratory medications, new advancements in procedures and treatments. Providing information on websites and "800" numbers for continued self-education. Includes information about supplement oxygen, available portable oxygen systems, continuous and intermittent flow rates, oxygen safety, concentrators, and Medicare reimbursement for oxygen. Explanation of Pulmonary Drugs, including class, frequency, complications, importance of spacers, rinsing mouth after steroid MDI's, and proper cleaning methods for nebulizers. Review of basic lung anatomy and physiology related to function, structure, and complications of lung disease. Review of risk factors. Discussion about methods for diagnosing sleep apnea and types of masks and machines for OSA. Includes a review of the use of types of environmental controls: home humidity, furnaces, filters, dust mite/pet prevention, HEPA vacuums.  Discussion about weather changes, air quality and the benefits of nasal washing. Instruction on Warning signs, infection symptoms, calling MD promptly, preventive modes, and value of vaccinations. Review of effective airway clearance, coughing and/or vibration techniques. Emphasizing that all should Create an Action Plan. Written material given at graduation.   AED/CPR: - Group verbal and written instruction with the use of models to demonstrate the basic use of the AED with the basic ABC's of resuscitation.    Anatomy and Cardiac Procedures: - Group verbal and visual presentation and models provide information about basic cardiac anatomy and function. Reviews the testing methods done to diagnose heart disease and the outcomes of the test results. Describes the treatment choices: Medical Management, Angioplasty, or Coronary Bypass Surgery for treating various heart conditions including Myocardial Infarction, Angina, Valve Disease, and Cardiac Arrhythmias.  Written material given at graduation. Flowsheet Row Cardiac Rehab from 05/30/2018 in [facility] Cardiac and Pulmonary Rehab  Date 04/25/18  Educator Clarissa  Instruction Review Code 1- Verbalizes Understanding      Medication Safety: - Group verbal and visual instruction to review commonly prescribed medications for heart and lung disease. Reviews the medication, class of the drug, and side effects. Includes the steps to properly store meds and maintain the prescription regimen.  Written material given at graduation.   Other: -Provides group and verbal instruction on various topics (see comments)   Knowledge Questionnaire Score:  Knowledge Questionnaire Score - 09/02/20 1109      Knowledge Questionnaire Score   Pre Score 16/18 inhaler            Core Components/Risk Factors/Patient Goals at Admission:  Personal Goals and Risk Factors at Admission - 09/02/20 1114      Core Components/Risk Factors/Patient Goals on Admission    Weight  Management Yes;Weight Loss    Intervention Weight Management: Develop a combined nutrition and exercise program designed to reach desired caloric intake, while maintaining appropriate intake of nutrient and fiber, sodium and fats, and  appropriate energy expenditure required for the weight goal.;Weight Management: Provide education and appropriate resources to help participant work on and attain dietary goals.;Weight Management/Obesity: Establish reasonable short term and long term weight goals.    Admit Weight 176 lb 3.2 oz (79.9 kg)    Goal Weight: Short Term 175 lb (79.4 kg)    Goal Weight: Long Term 170 lb (77.1 kg)    Expected Outcomes Short Term: Continue to assess and modify interventions until short term weight is achieved;Long Term: Adherence to nutrition and physical activity/exercise program aimed toward attainment of established weight goal;Weight Loss: Understanding of general recommendations for a balanced deficit meal plan, which promotes 1-2 lb weight loss per week and includes a negative energy balance of [phone] kcal/d;Understanding recommendations for meals to include 15-35% energy as protein, 25-35% energy from fat, 35-60% energy from carbohydrates, less than 200mg  of dietary cholesterol, 20-35 gm of total fiber daily;Understanding of distribution of calorie intake throughout the day with the consumption of 4-5 meals/snacks    Improve shortness of breath with ADL's Yes    Intervention Provide education, individualized exercise plan and daily activity instruction to help decrease symptoms of SOB with activities of daily living.    Expected Outcomes Short Term: Improve cardiorespiratory fitness to achieve a reduction of symptoms when performing ADLs;Long Term: Be able to perform more ADLs without symptoms or delay the onset of symptoms    Heart Failure Yes    Intervention Provide a combined exercise and nutrition program that is supplemented with education, support and counseling about  heart failure. Directed toward relieving symptoms such as shortness of breath, decreased exercise tolerance, and extremity edema.    Expected Outcomes Improve functional capacity of life;Short term: Attendance in program 2-3 days a week with increased exercise capacity. Reported lower sodium intake. Reported increased fruit and vegetable intake. Reports medication compliance.;Short term: Daily weights obtained and reported for increase. Utilizing diuretic protocols set by physician.;Long term: Adoption of self-care skills and reduction of barriers for early signs and symptoms recognition and intervention leading to self-care maintenance.    Hypertension Yes    Intervention Provide education on lifestyle modifcations including regular physical activity/exercise, weight management, moderate sodium restriction and increased consumption of fresh fruit, vegetables, and low fat dairy, alcohol moderation, and smoking cessation.;Monitor prescription use compliance.    Expected Outcomes Short Term: Continued assessment and intervention until BP is < 140/53mm HG in hypertensive participants. < 130/52mm HG in hypertensive participants with diabetes, heart failure or chronic kidney disease.;Long Term: Maintenance of blood pressure at goal levels.    Lipids Yes    Intervention Provide education and support for participant on nutrition & aerobic/resistive exercise along with prescribed medications to achieve LDL 70mg , HDL >40mg .    Expected Outcomes Short Term: Participant states understanding of desired cholesterol values and is compliant with medications prescribed. Participant is following exercise prescription and nutrition guidelines.;Long Term: Cholesterol controlled with medications as prescribed, with individualized exercise RX and with personalized nutrition plan. Value goals: LDL < 70mg , HDL > 40 mg.           Education:Diabetes - Individual verbal and written instruction to review signs/symptoms of  diabetes, desired ranges of glucose level fasting, after meals and with exercise. Acknowledge that pre and post exercise glucose checks will be done for 3 sessions at entry of program.   Know Your Numbers and Heart Failure: - Group verbal and visual instruction to discuss disease risk factors for cardiac and pulmonary disease and treatment options.  Reviews associated critical values for Overweight/Obesity, Hypertension, Cholesterol, and Diabetes.  Discusses basics of heart failure: signs/symptoms and treatments.  Introduces Heart Failure Zone chart for action plan for heart failure.  Written material given at graduation.   Core Components/Risk Factors/Patient Goals Review:    Core Components/Risk Factors/Patient Goals at Discharge (Final Review):    ITP Comments:  ITP Comments    Row Name 08/29/20 0910           ITP Comments Virtual Visit completed. Patient informed on EP and RD appointment and 6 Minute walk test. Patient also informed of patient health questionnaires on My Chart. Patient Verbalizes understanding. Visit diagnosis can be found in [facility]2/09/2020.              Comments: initial ITP

## 2020-09-02 NOTE — Patient Instructions (Signed)
Patient Instructions  Patient Details  Name: Joyce Evans MRN: [id number] Date of Birth: 29-Jun-1941 Referring Provider:  Kloefkorn, [place], MD  Below are your personal goals for exercise, nutrition, and risk factors. Our goal is to help you stay on track towards obtaining and maintaining these goals. We will be discussing your progress on these goals with you throughout the program.  Initial Exercise Prescription:  Initial Exercise Prescription - 09/02/20 1100      Date of Initial Exercise RX and Referring Provider   Date 09/02/20    Referring Provider Kloefkorn      Treadmill   MPH 1.5    Grade 0    Minutes 15    METs 2      Recumbant Bike   Level 1    RPM 60    Minutes 15    METs 2      NuStep   Level 2    SPM 80    Minutes 15    METs 2      Recumbant Elliptical   Level 1    RPM 50    Minutes 15    METs 2      REL-XR   Level 2    Watts 50    Minutes 15    METs 2      Prescription Details   Frequency (times per week) 3    Duration Progress to 30 minutes of continuous aerobic without signs/symptoms of physical distress      Intensity   THRR 40-80% of Max Heartrate 98-127    Ratings of Perceived Exertion 11-13      Progression   Progression Continue progressive overload as per policy without signs/symptoms or physical distress.      Resistance Training   Training Prescription Yes    Weight 3 lb    Reps 10-15           Exercise Goals: Frequency: Be able to perform aerobic exercise two to three times per week in program working toward 2-5 days per week of home exercise.  Intensity: Work with a perceived exertion of 11 (fairly light) - 15 (hard) while following your exercise prescription.  We will make changes to your prescription with you as you progress through the program.   Duration: Be able to do 30 to 45 minutes of continuous aerobic exercise in addition to a 5 minute warm-up and a 5 minute cool-down routine.   Nutrition Goals: Your personal  nutrition goals will be established when you do your nutrition analysis with the dietician.  The following are general nutrition guidelines to follow: Cholesterol < 200mg /day Sodium < 1500mg /day Fiber: Women over 50 yrs - 21 grams per day  Personal Goals:  Personal Goals and Risk Factors at Admission - 09/02/20 1114      Core Components/Risk Factors/Patient Goals on Admission    Weight Management Yes;Weight Loss    Intervention Weight Management: Develop a combined nutrition and exercise program designed to reach desired caloric intake, while maintaining appropriate intake of nutrient and fiber, sodium and fats, and appropriate energy expenditure required for the weight goal.;Weight Management: Provide education and appropriate resources to help participant work on and attain dietary goals.;Weight Management/Obesity: Establish reasonable short term and long term weight goals.    Admit Weight 176 lb 3.2 oz (79.9 kg)    Goal Weight: Short Term 175 lb (79.4 kg)    Goal Weight: Long Term 170 lb (77.1 kg)    Expected Outcomes Short Term:  Continue to assess and modify interventions until short term weight is achieved;Long Term: Adherence to nutrition and physical activity/exercise program aimed toward attainment of established weight goal;Weight Loss: Understanding of general recommendations for a balanced deficit meal plan, which promotes 1-2 lb weight loss per week and includes a negative energy balance of [phone] kcal/d;Understanding recommendations for meals to include 15-35% energy as protein, 25-35% energy from fat, 35-60% energy from carbohydrates, less than 200mg  of dietary cholesterol, 20-35 gm of total fiber daily;Understanding of distribution of calorie intake throughout the day with the consumption of 4-5 meals/snacks    Improve shortness of breath with ADL's Yes    Intervention Provide education, individualized exercise plan and daily activity instruction to help decrease symptoms of SOB  with activities of daily living.    Expected Outcomes Short Term: Improve cardiorespiratory fitness to achieve a reduction of symptoms when performing ADLs;Long Term: Be able to perform more ADLs without symptoms or delay the onset of symptoms    Heart Failure Yes    Intervention Provide a combined exercise and nutrition program that is supplemented with education, support and counseling about heart failure. Directed toward relieving symptoms such as shortness of breath, decreased exercise tolerance, and extremity edema.    Expected Outcomes Improve functional capacity of life;Short term: Attendance in program 2-3 days a week with increased exercise capacity. Reported lower sodium intake. Reported increased fruit and vegetable intake. Reports medication compliance.;Short term: Daily weights obtained and reported for increase. Utilizing diuretic protocols set by physician.;Long term: Adoption of self-care skills and reduction of barriers for early signs and symptoms recognition and intervention leading to self-care maintenance.    Hypertension Yes    Intervention Provide education on lifestyle modifcations including regular physical activity/exercise, weight management, moderate sodium restriction and increased consumption of fresh fruit, vegetables, and low fat dairy, alcohol moderation, and smoking cessation.;Monitor prescription use compliance.    Expected Outcomes Short Term: Continued assessment and intervention until BP is < 140/50mm HG in hypertensive participants. < 130/81mm HG in hypertensive participants with diabetes, heart failure or chronic kidney disease.;Long Term: Maintenance of blood pressure at goal levels.    Lipids Yes    Intervention Provide education and support for participant on nutrition & aerobic/resistive exercise along with prescribed medications to achieve LDL 70mg , HDL >40mg .    Expected Outcomes Short Term: Participant states understanding of desired cholesterol values and is  compliant with medications prescribed. Participant is following exercise prescription and nutrition guidelines.;Long Term: Cholesterol controlled with medications as prescribed, with individualized exercise RX and with personalized nutrition plan. Value goals: LDL < 70mg , HDL > 40 mg.           Tobacco Use Initial Evaluation: Social History   Tobacco Use  Smoking Status Never Smoker  Smokeless Tobacco Never Used    Exercise Goals and Review:  Exercise Goals    Row Name 09/02/20 1104             Exercise Goals   Increase Physical Activity Yes       Intervention Provide advice, education, support and counseling about physical activity/exercise needs.;Develop an individualized exercise prescription for aerobic and resistive training based on initial evaluation findings, risk stratification, comorbidities and participant's personal goals.       Expected Outcomes Short Term: Attend rehab on a regular basis to increase amount of physical activity.;Long Term: Add in home exercise to make exercise part of routine and to increase amount of physical activity.;Long Term: Exercising regularly at least 3-5 days  a week.       Increase Strength and Stamina Yes       Intervention Provide advice, education, support and counseling about physical activity/exercise needs.;Develop an individualized exercise prescription for aerobic and resistive training based on initial evaluation findings, risk stratification, comorbidities and participant's personal goals.       Expected Outcomes Short Term: Increase workloads from initial exercise prescription for resistance, speed, and METs.;Short Term: Perform resistance training exercises routinely during rehab and add in resistance training at home;Long Term: Improve cardiorespiratory fitness, muscular endurance and strength as measured by increased METs and functional capacity (6MWT)       Able to understand and use rate of perceived exertion (RPE) scale Yes        Intervention Provide education and explanation on how to use RPE scale       Expected Outcomes Short Term: Able to use RPE daily in rehab to express subjective intensity level;Long Term:  Able to use RPE to guide intensity level when exercising independently       Able to understand and use Dyspnea scale Yes       Intervention Provide education and explanation on how to use Dyspnea scale       Expected Outcomes Short Term: Able to use Dyspnea scale daily in rehab to express subjective sense of shortness of breath during exertion;Long Term: Able to use Dyspnea scale to guide intensity level when exercising independently       Knowledge and understanding of Target Heart Rate Range (THRR) Yes       Intervention Provide education and explanation of THRR including how the numbers were predicted and where they are located for reference       Expected Outcomes Short Term: Able to use daily as guideline for intensity in rehab;Short Term: Able to state/look up THRR;Long Term: Able to use THRR to govern intensity when exercising independently       Able to check pulse independently Yes       Intervention Provide education and demonstration on how to check pulse in carotid and radial arteries.;Review the importance of being able to check your own pulse for safety during independent exercise       Expected Outcomes Short Term: Able to explain why pulse checking is important during independent exercise;Long Term: Able to check pulse independently and accurately       Understanding of Exercise Prescription Yes       Intervention Provide education, explanation, and written materials on patient's individual exercise prescription       Expected Outcomes Short Term: Able to explain program exercise prescription;Long Term: Able to explain home exercise prescription to exercise independently              Copy of goals given to participant.

## 2020-09-04 ENCOUNTER — Other Ambulatory Visit: Payer: Self-pay

## 2020-09-04 ENCOUNTER — Encounter: Payer: Self-pay | Admitting: *Deleted

## 2020-09-04 DIAGNOSIS — J849 Interstitial pulmonary disease, unspecified: Secondary | ICD-10-CM | POA: Diagnosis not present

## 2020-09-04 NOTE — Progress Notes (Signed)
Pulmonary Individual Treatment Plan  Patient Details  Name: Joyce Evans MRN: [id number] Date of Birth: [birth year] Referring Provider:   Flowsheet Row Pulmonary Rehab from 09/02/2020 in [facility] Cardiac and Pulmonary Rehab  Referring Provider Kloefkorn      Initial Encounter Date:  Flowsheet Row Pulmonary Rehab from 09/02/2020 in [facility] Cardiac and Pulmonary Rehab  Date 09/02/20      Visit Diagnosis: ILD (interstitial lung disease) ([place])  Patient's Home Medications on Admission:  Current Outpatient Medications:  .  acetaminophen (TYLENOL) 500 MG tablet, Take 500 mg by mouth every 6 (six) hours as needed., Disp: , Rfl:  .  amLODipine (NORVASC) 5 MG tablet, Take 5 mg by mouth daily., Disp: , Rfl:  .  apixaban (ELIQUIS) 5 MG TABS tablet, Take 5 mg by mouth 2 (two) times daily. , Disp: , Rfl:  .  atorvastatin (LIPITOR) 40 MG tablet, Take 1 tablet (40 mg total) by mouth daily., Disp: 30 tablet, Rfl: 3 .  atorvastatin (LIPITOR) 40 MG tablet, Take by mouth., Disp: , Rfl:  .  azelastine (ASTELIN) 0.1 % nasal spray, Place 2 sprays into both nostrils 2 (two) times daily. (Patient not taking: Reported on 08/29/2020), Disp: , Rfl:  .  brimonidine (ALPHAGAN P) 0.1 % SOLN, Place 1 drop into the right eye 2 (two) times daily., Disp: , Rfl:  .  Brinzolamide-Brimonidine 1-0.2 % SUSP, Place 1 drop into the left eye 2 (two) times daily., Disp: , Rfl:  .  cetirizine (ZYRTEC) 10 MG tablet, Take 10 mg by mouth daily. (Patient not taking: Reported on 08/29/2020), Disp: , Rfl:  .  Cholecalciferol (VITAMIN D3) 3000 units TABS, Take 3,000-6,000 Units by mouth 2 (two) times daily. , Disp: , Rfl:  .  Cholecalciferol 50 MCG (2000 UT) CAPS, Take by mouth., Disp: , Rfl:  .  fluticasone (FLONASE) 50 MCG/ACT nasal spray, Place 2 sprays into the nose daily., Disp: , Rfl:  .  furosemide (LASIX) 20 MG tablet, Take 20 mg by mouth daily., Disp: , Rfl:  .  furosemide (LASIX) 20 MG tablet, Take by mouth. (Patient not taking:  Reported on 08/29/2020), Disp: , Rfl:  .  losartan (COZAAR) 100 MG tablet, Take 100 mg by mouth daily., Disp: , Rfl:  .  multivitamin-iron-minerals-folic acid (CENTRUM) chewable tablet, Chew 1 tablet by mouth daily., Disp: , Rfl:  .  omeprazole (PRILOSEC) 20 MG capsule, Take 20 mg by mouth 2 (two) times daily. Reported on 08/28/2015 (Patient not taking: Reported on 08/29/2020), Disp: , Rfl:  .  omeprazole (PRILOSEC) 40 MG capsule, Take by mouth., Disp: , Rfl:  .  Probiotic Product (PROBIOTIC DAILY PO), Take by mouth daily., Disp: , Rfl:  .  spironolactone (ALDACTONE) 25 MG tablet, Take 12.5 mg by mouth daily. (Patient not taking: Reported on 08/29/2020), Disp: , Rfl:  .  timolol (BETIMOL) 0.5 % ophthalmic solution, Place 1 drop into both eyes daily., Disp: , Rfl:  .  Zinc Citrate-Phytase (ZYTAZE) 25-500 MG CAPS, Take by mouth., Disp: , Rfl:   Past Medical History: Past Medical History:  Diagnosis Date  . Arthritis    osteo - shoulders, fingers  . Atrial fibrillation ([place])   . Dizziness    thinks because of diuretic  . Family history of adverse reaction to anesthesia    daughter -PONV  . Gallstones 2016, 2017  . GERD (gastroesophageal reflux disease)   . Glaucoma   . Heart murmur    history of  . Hypertension   .  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 Excised: 05/19/2016, margins free.  . Numbness in left leg    s/p hematoma  . Sciatica    right  . Seasonal allergies   . Skin cancer   . 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 Tricuspid valve disorder    leak    Tobacco Use: Social History   Tobacco Use  Smoking Status Never Smoker  Smokeless Tobacco Never Used    Labs: Recent Review Flowsheet Data    Labs for ITP Cardiac and Pulmonary Rehab Latest Ref Rng & Units 06/17/2020 06/18/2020   Cholestrol 0 - 200 mg/dL - 184   LDLCALC 0 - 99 mg/dL - 122(H)   LDLDIRECT 0 - 99 mg/dL 133.5(H) -   HDL  >40 mg/dL - 51   Trlycerides <150 mg/dL - 57   Hemoglobin A1c 4.8 - 5.6 % 5.4 -       Pulmonary Assessment Scores:  Pulmonary Assessment Scores    Row Name 09/02/20 1106         ADL UCSD   SOB Score total 48     Rest 1     Walk 2     Stairs 4     Bath 0     Dress 1     Shop 3           CAT Score   CAT Score 18           mMRC Score   mMRC Score 2            UCSD: Self-administered rating of dyspnea associated with activities of daily living (ADLs) 6-point scale (0 = "not at all" to 5 = "maximal or unable to do because of breathlessness")  Scoring Scores range from 0 to 120.  Minimally important difference is 5 units  CAT: CAT can identify the health impairment of COPD patients and is better correlated with disease progression.  CAT has a scoring range of zero to 40. The CAT score is classified into four groups of low (less than 10), medium (10 - 20), high (21-30) and very high (31-40) based on the impact level of disease on health status. A CAT score over 10 suggests significant symptoms.  A worsening CAT score could be explained by an exacerbation, poor medication adherence, poor inhaler technique, or progression of COPD or comorbid conditions.  CAT MCID is 2 points  mMRC: mMRC (Modified Medical Research Council) Dyspnea Scale is used to assess the degree of baseline functional disability in patients of respiratory disease due to dyspnea. No minimal important difference is established. A decrease in score of 1 point or greater is considered a positive change.   Pulmonary Function Assessment:  Pulmonary Function Assessment - 08/29/20 0913      Breath   Shortness of Breath Yes;Limiting activity           Exercise Target Goals: Exercise Program Goal: Individual exercise prescription set using results from initial 6 min walk test and THRR while considering  patient's activity barriers and safety.   Exercise Prescription Goal: Initial exercise prescription  builds to 30-45 minutes a day of aerobic activity, 2-3 days per week.  Home exercise guidelines will be given to patient during program as part of exercise prescription that the participant will acknowledge.  Education: Aerobic Exercise: - Group verbal and visual presentation on the components of exercise prescription. Introduces  F.I.T.T principle from ACSM for exercise prescriptions.  Reviews F.I.T.T. principles of aerobic exercise including progression. Written material given at graduation.   Education: Resistance Exercise: - Group verbal and visual presentation on the components of exercise prescription. Introduces F.I.T.T principle from ACSM for exercise prescriptions  Reviews F.I.T.T. principles of resistance exercise including progression. Written material given at graduation.    Education: Exercise & Equipment Safety: - Individual verbal instruction and demonstration of equipment use and safety with use of the equipment. Flowsheet Row Pulmonary Rehab from 09/02/2020 in [facility] Cardiac and Pulmonary Rehab  Date 09/02/20  Educator AS  Instruction Review Code 1- Verbalizes Understanding      Education: Exercise Physiology & General Exercise Guidelines: - Group verbal and written instruction with models to review the exercise physiology of the cardiovascular system and associated critical values. Provides general exercise guidelines with specific guidelines to those with heart or lung disease.  Flowsheet Row Cardiac Rehab from 05/30/2018 in [facility] Cardiac and Pulmonary Rehab  Date 04/06/18  Educator Nada Maclachlan, EP  Instruction Review Code 1- Verbalizes Understanding      Education: Flexibility, Balance, Mind/Body Relaxation: - Group verbal and visual presentation with interactive activity on the components of exercise prescription. Introduces F.I.T.T principle from ACSM for exercise prescriptions. Reviews F.I.T.T. principles of flexibility and balance exercise training including  progression. Also discusses the mind body connection.  Reviews various relaxation techniques to help reduce and manage stress (i.e. Deep breathing, progressive muscle relaxation, and visualization). Balance handout provided to take home. Written material given at graduation. Flowsheet Row Cardiac Rehab from 05/30/2018 in [facility] Cardiac and Pulmonary Rehab  Date 04/18/18  Educator AS  Instruction Review Code 1- Verbalizes Understanding      Activity Barriers & Risk Stratification:   6 Minute Walk:  6 Minute Walk    Row Name 09/02/20 1056         6 Minute Walk   Phase Initial     Distance 950 feet     Walk Time 6 minutes     # of Rest Breaks 0     MPH 1.8     METS 1.95     RPE 12     Perceived Dyspnea  1     VO2 Peak 6.82     Symptoms No     Resting HR 69 bpm     Resting BP 142/68     Resting Oxygen Saturation  95 %     Exercise Oxygen Saturation  during 6 min walk 85 %     Max Ex. HR 111 bpm     Max Ex. BP 164/78     2 Minute Post BP 144/68           Interval HR   1 Minute HR 90     2 Minute HR 81     3 Minute HR 105     4 Minute HR 109     5 Minute HR 111     6 Minute HR 103     2 Minute Post HR 98     Interval Heart Rate? Yes           Interval Oxygen   Interval Oxygen? Yes     Baseline Oxygen Saturation % 95 %     1 Minute Oxygen Saturation % 90 %     1 Minute Liters of Oxygen 4 L  pulsed     2 Minute Oxygen Saturation % 89 %  2 Minute Liters of Oxygen 4 L  pulsed     3 Minute Oxygen Saturation % 88 %     3 Minute Liters of Oxygen 4 L  pulsed     4 Minute Oxygen Saturation % 87 %     4 Minute Liters of Oxygen 4 L  pulsed     5 Minute Oxygen Saturation % 85 %     5 Minute Liters of Oxygen 4 L  pulsed     6 Minute Oxygen Saturation % 85 %     6 Minute Liters of Oxygen 4 L  pulsed     2 Minute Post Oxygen Saturation % 88 %     2 Minute Post Liters of Oxygen 4 L  pulsed           Oxygen Initial Assessment:  Oxygen Initial Assessment - 08/29/20 0911       Home Oxygen   Home Oxygen Device Portable Concentrator;Home Concentrator;E-Tanks    Sleep Oxygen Prescription Continuous   PRN   Liters per minute 2   PRN   Home Exercise Oxygen Prescription Continuous    Liters per minute 2   PRN   Home Resting Oxygen Prescription Continuous    Liters per minute 2   PRN   Compliance with Home Oxygen Use Yes      Initial 6 min Walk   Oxygen Used None      Program Oxygen Prescription   Program Oxygen Prescription None      Intervention   Short Term Goals To learn and exhibit compliance with exercise, home and travel O2 prescription;To learn and understand importance of monitoring SPO2 with pulse oximeter and demonstrate accurate use of the pulse oximeter.;To learn and understand importance of maintaining oxygen saturations>88%;To learn and demonstrate proper pursed lip breathing techniques or other breathing techniques.;To learn and demonstrate proper use of respiratory medications    Long  Term Goals Exhibits compliance with exercise, home and travel O2 prescription;Verbalizes importance of monitoring SPO2 with pulse oximeter and return demonstration;Maintenance of O2 saturations>88%;Exhibits proper breathing techniques, such as pursed lip breathing or other method taught during program session;Compliance with respiratory medication;Demonstrates proper use of MDI's           Oxygen Re-Evaluation:   Oxygen Discharge (Final Oxygen Re-Evaluation):   Initial Exercise Prescription:  Initial Exercise Prescription - 09/02/20 1100      Date of Initial Exercise RX and Referring Provider   Date 09/02/20    Referring Provider Kloefkorn      Treadmill   MPH 1.5    Grade 0    Minutes 15    METs 2      Recumbant Bike   Level 1    RPM 60    Minutes 15    METs 2      NuStep   Level 2    SPM 80    Minutes 15    METs 2      Recumbant Elliptical   Level 1    RPM 50    Minutes 15    METs 2      REL-XR   Level 2    Watts 50     Minutes 15    METs 2      Prescription Details   Frequency (times per week) 3    Duration Progress to 30 minutes of continuous aerobic without signs/symptoms of physical distress      Intensity   THRR 40-80% of Max  Heartrate 98-127    Ratings of Perceived Exertion 11-13      Progression   Progression Continue progressive overload as per policy without signs/symptoms or physical distress.      Resistance Training   Training Prescription Yes    Weight 3 lb    Reps 10-15           Perform Capillary Blood Glucose checks as needed.  Exercise Prescription Changes:  Exercise Prescription Changes    Row Name 09/02/20 1100             Response to Exercise   Blood Pressure (Admit) 142/68       Blood Pressure (Exercise) 164/78       Blood Pressure (Exit) 144/68       Heart Rate (Admit) 69 bpm       Heart Rate (Exercise) 111 bpm       Heart Rate (Exit) 82 bpm       Rating of Perceived Exertion (Exercise) 12       Perceived Dyspnea (Exercise) 1       Symptoms none              Exercise Comments:   Exercise Goals and Review:  Exercise Goals    Row Name 09/02/20 1104             Exercise Goals   Increase Physical Activity Yes       Intervention Provide advice, education, support and counseling about physical activity/exercise needs.;Develop an individualized exercise prescription for aerobic and resistive training based on initial evaluation findings, risk stratification, comorbidities and participant's personal goals.       Expected Outcomes Short Term: Attend rehab on a regular basis to increase amount of physical activity.;Long Term: Add in home exercise to make exercise part of routine and to increase amount of physical activity.;Long Term: Exercising regularly at least 3-5 days a week.       Increase Strength and Stamina Yes       Intervention Provide advice, education, support and counseling about physical activity/exercise needs.;Develop an individualized  exercise prescription for aerobic and resistive training based on initial evaluation findings, risk stratification, comorbidities and participant's personal goals.       Expected Outcomes Short Term: Increase workloads from initial exercise prescription for resistance, speed, and METs.;Short Term: Perform resistance training exercises routinely during rehab and add in resistance training at home;Long Term: Improve cardiorespiratory fitness, muscular endurance and strength as measured by increased METs and functional capacity (6MWT)       Able to understand and use rate of perceived exertion (RPE) scale Yes       Intervention Provide education and explanation on how to use RPE scale       Expected Outcomes Short Term: Able to use RPE daily in rehab to express subjective intensity level;Long Term:  Able to use RPE to guide intensity level when exercising independently       Able to understand and use Dyspnea scale Yes       Intervention Provide education and explanation on how to use Dyspnea scale       Expected Outcomes Short Term: Able to use Dyspnea scale daily in rehab to express subjective sense of shortness of breath during exertion;Long Term: Able to use Dyspnea scale to guide intensity level when exercising independently       Knowledge and understanding of Target Heart Rate Range (THRR) Yes       Intervention Provide education and explanation of  THRR including how the numbers were predicted and where they are located for reference       Expected Outcomes Short Term: Able to use daily as guideline for intensity in rehab;Short Term: Able to state/look up THRR;Long Term: Able to use THRR to govern intensity when exercising independently       Able to check pulse independently Yes       Intervention Provide education and demonstration on how to check pulse in carotid and radial arteries.;Review the importance of being able to check your own pulse for safety during independent exercise       Expected  Outcomes Short Term: Able to explain why pulse checking is important during independent exercise;Long Term: Able to check pulse independently and accurately       Understanding of Exercise Prescription Yes       Intervention Provide education, explanation, and written materials on patient's individual exercise prescription       Expected Outcomes Short Term: Able to explain program exercise prescription;Long Term: Able to explain home exercise prescription to exercise independently              Exercise Goals Re-Evaluation :   Discharge Exercise Prescription (Final Exercise Prescription Changes):  Exercise Prescription Changes - 09/02/20 1100      Response to Exercise   Blood Pressure (Admit) 142/68    Blood Pressure (Exercise) 164/78    Blood Pressure (Exit) 144/68    Heart Rate (Admit) 69 bpm    Heart Rate (Exercise) 111 bpm    Heart Rate (Exit) 82 bpm    Rating of Perceived Exertion (Exercise) 12    Perceived Dyspnea (Exercise) 1    Symptoms none           Nutrition:  Target Goals: Understanding of nutrition guidelines, daily intake of sodium 1500mg , cholesterol 200mg , calories 30% from fat and 7% or less from saturated fats, daily to have 5 or more servings of fruits and vegetables.  Education: All About Nutrition: -Group instruction provided by verbal, written material, interactive activities, discussions, models, and posters to present general guidelines for heart healthy nutrition including fat, fiber, MyPlate, the role of sodium in heart healthy nutrition, utilization of the nutrition label, and utilization of this knowledge for meal planning. Follow up email sent as well. Written material given at graduation. Flowsheet Row Cardiac Rehab from 05/30/2018 in [facility] Cardiac and Pulmonary Rehab  Date 05/16/18  Educator LB  Instruction Review Code 1- Verbalizes Understanding      Biometrics:  Pre Biometrics - 09/02/20 1105      Pre Biometrics   Height 5' 2.75" (1.594  m)    Weight 176 lb 3.2 oz (79.9 kg)    BMI (Calculated) 31.46            Nutrition Therapy Plan and Nutrition Goals:  Nutrition Therapy & Goals - 09/02/20 1020      Nutrition Therapy   Diet Heart healthy, low Na, pulmonary MNT    Drug/Food Interactions Statins/Certain Fruits    Protein (specify units) 80g    Fiber 25 grams    Whole Grain Foods 3 servings    Saturated Fats 12 max. grams    Fruits and Vegetables 8 servings/day    Sodium 1.5 grams      Personal Nutrition Goals   Nutrition Goal ST: Do not add salt to home cooked meals, eat 6 small meals/day LT: limit eating out to 2-3x/week, do not cook with salt, improve breathing with meals.  Comments She has been getting Hello Fresh. She is going on a Cruise soon in March. She believes her problem is portion control. B: egg and cheese wrap - dunkin doughnuts or bran flake cereal with blueberries L: She reports eating out a lot for lunch - taco bell - bean burrito supreme and plain taco, [place] restaurants, red lobster (salmon, brussels sprouts). D: leftovers, hello fresh, or fixes a meal herself - avocado oil, green beans, sea salt, turnip greens, spinach, roasted carrots, brussells sprouts, pork chops, salmon cakes. Drinks: coffee (decaf, caramel macchiado creamer) or green tea and lots of water. Uses benecol instead of butter. She reports liking cheese - havarti, [place], swiss, [place]. She reports eating a lot of peanut butter (natural Jiff). Uses almond milk. She reports eating beans with corn sometimes. She is still strugglin with heartburn. Reviewed heart healthy eating and pulmonary freindly eating. Plan to reduce salt at home, eat smaller -more frequent meals to help with breathing, then reduce frequency of eating out.      Intervention Plan   Intervention Prescribe, educate and counsel regarding individualized specific dietary modifications aiming towards targeted core components such as weight, hypertension, lipid management,  diabetes, heart failure and other comorbidities.;Nutrition handout(s) given to patient.    Expected Outcomes Short Term Goal: Understand basic principles of dietary content, such as calories, fat, sodium, cholesterol and nutrients.;Short Term Goal: A plan has been developed with personal nutrition goals set during dietitian appointment.;Long Term Goal: Adherence to prescribed nutrition plan.           Nutrition Assessments:  MEDIFICTS Score Key:  ?70 Need to make dietary changes   40-70 Heart Healthy Diet  ? 40 Therapeutic Level Cholesterol Diet  Flowsheet Row Pulmonary Rehab from 09/02/2020 in [facility] Cardiac and Pulmonary Rehab  Picture Your Plate Total Score on Admission 77     Picture Your Plate Scores:  <19 Unhealthy dietary pattern with much room for improvement.  41-50 Dietary pattern unlikely to meet recommendations for good health and room for improvement.  51-60 More healthful dietary pattern, with some room for improvement.   >60 Healthy dietary pattern, although there may be some specific behaviors that could be improved.   Nutrition Goals Re-Evaluation:   Nutrition Goals Discharge (Final Nutrition Goals Re-Evaluation):   Psychosocial: Target Goals: Acknowledge presence or absence of significant depression and/or stress, maximize coping skills, provide positive support system. Participant is able to verbalize types and ability to use techniques and skills needed for reducing stress and depression.   Education: Stress, Anxiety, and Depression - Group verbal and visual presentation to define topics covered.  Reviews how body is impacted by stress, anxiety, and depression.  Also discusses healthy ways to reduce stress and to treat/manage anxiety and depression.  Written material given at graduation. Flowsheet Row Cardiac Rehab from 05/30/2018 in [facility] Cardiac and Pulmonary Rehab  Date 04/13/18  Educator [facility]  Instruction Review Code 1- [employer] Understanding       Education: Sleep Hygiene -Provides group verbal and written instruction about how sleep can affect your health.  Define sleep hygiene, discuss sleep cycles and impact of sleep habits. Review good sleep hygiene tips.  Flowsheet Row Cardiac Rehab from 05/30/2018 in [facility] Cardiac and Pulmonary Rehab  Date 03/16/18  Educator [facility]  Instruction Review Code 1- Verbalizes Understanding      Initial Review & Psychosocial Screening:  Initial Psych Review & Screening - 08/29/20 0915      Initial Review   Current issues with Current  Psychotropic Meds;Current Anxiety/Panic    Comments She sometimes is anxious and is a positive person in general.      [place]? Yes    Comments Zaniyah can look to her husband and her two daughters for support.      Barriers   Psychosocial barriers to participate in program The patient should benefit from training in stress management and relaxation.      Screening Interventions   Interventions To provide support and resources with identified psychosocial needs;Encouraged to exercise;Provide feedback about the scores to participant    Expected Outcomes Short Term goal: Utilizing psychosocial counselor, staff and physician to assist with identification of specific Stressors or current issues interfering with healing process. Setting desired goal for each stressor or current issue identified.;Long Term Goal: Stressors or current issues are controlled or eliminated.;Short Term goal: Identification and review with participant of any Quality of Life or Depression concerns found by scoring the questionnaire.;Long Term goal: The participant improves quality of Life and PHQ9 Scores as seen by post scores and/or verbalization of changes           Quality of Life Scores:  Scores of 19 and below usually indicate a poorer quality of life in these areas.  A difference of  2-3 points is a clinically meaningful difference.  A difference of 2-3 points  in the total score of the Quality of Life Index has been associated with significant improvement in overall quality of life, self-image, physical symptoms, and general health in studies assessing change in quality of life.  PHQ-9: Recent Review Flowsheet Data    Depression screen [facility] 2/9 09/02/2020 05/30/2018 03/08/2018   Decreased Interest 0 0 1   Down, Depressed, Hopeless 0 0 0   PHQ - 2 Score 0 0 1   Altered sleeping - - 0   Tired, decreased energy 1 1 1    Change in appetite 1 0 0   Feeling bad or failure about yourself  0 0 0   Trouble concentrating 0 0 0   Moving slowly or fidgety/restless 0 0 0   Suicidal thoughts 0 0 0   PHQ-9 Score - - 2   Difficult doing work/chores Not difficult at all - Not difficult at all     Interpretation of Total Score  Total Score Depression Severity:  1-4 = Minimal depression, 5-9 = Mild depression, 10-14 = Moderate depression, 15-19 = Moderately severe depression, 20-27 = Severe depression   Psychosocial Evaluation and Intervention:  Psychosocial Evaluation - 08/29/20 0917      Psychosocial Evaluation & Interventions   Interventions Encouraged to exercise with the program and follow exercise prescription;Relaxation education;Stress management education    Comments She sometimes is anxious and is a positive person in general.Daejah can look to her husband and her two daughters for support.    Expected Outcomes Short: Exercise regularly to support mental health and notify staff of any changes. Long: maintain mental health and well being through teaching of rehab or prescribed medications independently.    Continue Psychosocial Services  Follow up required by staff           Psychosocial Re-Evaluation:   Psychosocial Discharge (Final Psychosocial Re-Evaluation):   Education: Education Goals: Education classes will be provided on a weekly basis, covering required topics. Participant will state understanding/return demonstration of topics  presented.  Learning Barriers/Preferences:  Learning Barriers/Preferences - 08/29/20 0910      Learning Barriers/Preferences   Learning Barriers None  Learning Preferences None           General Pulmonary Education Topics:  Infection Prevention: - Provides verbal and written material to individual with discussion of infection control including proper hand washing and proper equipment cleaning during exercise session. Flowsheet Row Pulmonary Rehab from 09/02/2020 in [facility] Cardiac and Pulmonary Rehab  Date 09/02/20  Educator As  Instruction Review Code 1- Verbalizes Understanding      Falls Prevention: - Provides verbal and written material to individual with discussion of falls prevention and safety. Flowsheet Row Pulmonary Rehab from 08/29/2020 in [facility] Cardiac and Pulmonary Rehab  Date 08/29/20  Educator [facility]  Instruction Review Code 1- Verbalizes Understanding      Chronic Lung Disease Review: - Group verbal instruction with posters, models, PowerPoint presentations and videos,  to review new updates, new respiratory medications, new advancements in procedures and treatments. Providing information on websites and "800" numbers for continued self-education. Includes information about supplement oxygen, available portable oxygen systems, continuous and intermittent flow rates, oxygen safety, concentrators, and Medicare reimbursement for oxygen. Explanation of Pulmonary Drugs, including class, frequency, complications, importance of spacers, rinsing mouth after steroid MDI's, and proper cleaning methods for nebulizers. Review of basic lung anatomy and physiology related to function, structure, and complications of lung disease. Review of risk factors. Discussion about methods for diagnosing sleep apnea and types of masks and machines for OSA. Includes a review of the use of types of environmental controls: home humidity, furnaces, filters, dust mite/pet prevention, HEPA vacuums.  Discussion about weather changes, air quality and the benefits of nasal washing. Instruction on Warning signs, infection symptoms, calling MD promptly, preventive modes, and value of vaccinations. Review of effective airway clearance, coughing and/or vibration techniques. Emphasizing that all should Create an Action Plan. Written material given at graduation.   AED/CPR: - Group verbal and written instruction with the use of models to demonstrate the basic use of the AED with the basic ABC's of resuscitation.    Anatomy and Cardiac Procedures: - Group verbal and visual presentation and models provide information about basic cardiac anatomy and function. Reviews the testing methods done to diagnose heart disease and the outcomes of the test results. Describes the treatment choices: Medical Management, Angioplasty, or Coronary Bypass Surgery for treating various heart conditions including Myocardial Infarction, Angina, Valve Disease, and Cardiac Arrhythmias.  Written material given at graduation. Flowsheet Row Cardiac Rehab from 05/30/2018 in [facility] Cardiac and Pulmonary Rehab  Date 04/25/18  Educator [place]  Instruction Review Code 1- Verbalizes Understanding      Medication Safety: - Group verbal and visual instruction to review commonly prescribed medications for heart and lung disease. Reviews the medication, class of the drug, and side effects. Includes the steps to properly store meds and maintain the prescription regimen.  Written material given at graduation.   Other: -Provides group and verbal instruction on various topics (see comments)   Knowledge Questionnaire Score:  Knowledge Questionnaire Score - 09/02/20 1109      Knowledge Questionnaire Score   Pre Score 16/18 inhaler            Core Components/Risk Factors/Patient Goals at Admission:  Personal Goals and Risk Factors at Admission - 09/02/20 1114      Core Components/Risk Factors/Patient Goals on Admission    Weight  Management Yes;Weight Loss    Intervention Weight Management: Develop a combined nutrition and exercise program designed to reach desired caloric intake, while maintaining appropriate intake of nutrient and fiber, sodium and fats, and  appropriate energy expenditure required for the weight goal.;Weight Management: Provide education and appropriate resources to help participant work on and attain dietary goals.;Weight Management/Obesity: Establish reasonable short term and long term weight goals.    Admit Weight 176 lb 3.2 oz (79.9 kg)    Goal Weight: Short Term 175 lb (79.4 kg)    Goal Weight: Long Term 170 lb (77.1 kg)    Expected Outcomes Short Term: Continue to assess and modify interventions until short term weight is achieved;Long Term: Adherence to nutrition and physical activity/exercise program aimed toward attainment of established weight goal;Weight Loss: Understanding of general recommendations for a balanced deficit meal plan, which promotes 1-2 lb weight loss per week and includes a negative energy balance of [phone] kcal/d;Understanding recommendations for meals to include 15-35% energy as protein, 25-35% energy from fat, 35-60% energy from carbohydrates, less than 200mg  of dietary cholesterol, 20-35 gm of total fiber daily;Understanding of distribution of calorie intake throughout the day with the consumption of 4-5 meals/snacks    Improve shortness of breath with ADL's Yes    Intervention Provide education, individualized exercise plan and daily activity instruction to help decrease symptoms of SOB with activities of daily living.    Expected Outcomes Short Term: Improve cardiorespiratory fitness to achieve a reduction of symptoms when performing ADLs;Long Term: Be able to perform more ADLs without symptoms or delay the onset of symptoms    Heart Failure Yes    Intervention Provide a combined exercise and nutrition program that is supplemented with education, support and counseling about  heart failure. Directed toward relieving symptoms such as shortness of breath, decreased exercise tolerance, and extremity edema.    Expected Outcomes Improve functional capacity of life;Short term: Attendance in program 2-3 days a week with increased exercise capacity. Reported lower sodium intake. Reported increased fruit and vegetable intake. Reports medication compliance.;Short term: Daily weights obtained and reported for increase. Utilizing diuretic protocols set by physician.;Long term: Adoption of self-care skills and reduction of barriers for early signs and symptoms recognition and intervention leading to self-care maintenance.    Hypertension Yes    Intervention Provide education on lifestyle modifcations including regular physical activity/exercise, weight management, moderate sodium restriction and increased consumption of fresh fruit, vegetables, and low fat dairy, alcohol moderation, and smoking cessation.;Monitor prescription use compliance.    Expected Outcomes Short Term: Continued assessment and intervention until BP is < 140/50mm HG in hypertensive participants. < 130/64mm HG in hypertensive participants with diabetes, heart failure or chronic kidney disease.;Long Term: Maintenance of blood pressure at goal levels.    Lipids Yes    Intervention Provide education and support for participant on nutrition & aerobic/resistive exercise along with prescribed medications to achieve LDL 70mg , HDL >40mg .    Expected Outcomes Short Term: Participant states understanding of desired cholesterol values and is compliant with medications prescribed. Participant is following exercise prescription and nutrition guidelines.;Long Term: Cholesterol controlled with medications as prescribed, with individualized exercise RX and with personalized nutrition plan. Value goals: LDL < 70mg , HDL > 40 mg.           Education:Diabetes - Individual verbal and written instruction to review signs/symptoms of  diabetes, desired ranges of glucose level fasting, after meals and with exercise. Acknowledge that pre and post exercise glucose checks will be done for 3 sessions at entry of program.   Know Your Numbers and Heart Failure: - Group verbal and visual instruction to discuss disease risk factors for cardiac and pulmonary disease and treatment options.  Reviews associated critical values for Overweight/Obesity, Hypertension, Cholesterol, and Diabetes.  Discusses basics of heart failure: signs/symptoms and treatments.  Introduces Heart Failure Zone chart for action plan for heart failure.  Written material given at graduation.   Core Components/Risk Factors/Patient Goals Review:    Core Components/Risk Factors/Patient Goals at Discharge (Final Review):    ITP Comments:  ITP Comments    Row Name 08/29/20 0910 09/02/20 1119 09/04/20 0633       ITP Comments Virtual Visit completed. Patient informed on EP and RD appointment and 6 Minute walk test. Patient also informed of patient health questionnaires on My Chart. Patient Verbalizes understanding. Visit diagnosis can be found in [facility]2/09/2020. Completed 6MWT and gym orientation. Initial ITP created and sent for review to Dr. Emily Filbert, Medical Director. 30 Day review completed. Medical Director ITP review done, changes made as directed, and signed approval by Medical Director.  New to program            Comments:

## 2020-09-04 NOTE — Progress Notes (Signed)
Daily Session Note  Patient Details  Name: Joyce Evans MRN: [id number] Date of Birth: April 18, 1941 Referring Provider:   Flowsheet Row Pulmonary Rehab from 09/02/2020 in [facility] Cardiac and Pulmonary Rehab  Referring Provider Kloefkorn      Encounter Date: 09/04/2020  Check In:  Session Check In - 09/04/20 1417      Check-In   Supervising physician immediately available to respond to emergencies See telemetry face sheet for immediately available ER MD    Location [facility]-Cardiac & Pulmonary Rehab    Staff Present Birdie Sons, MPA, RN;Joseph Lou [place], [place] Exercise Physiologist    Virtual Visit No    Medication changes reported     No    Fall or balance concerns reported    No    Tobacco Cessation No Change    Warm-up and Cool-down Performed on first and last piece of equipment    Resistance Training Performed Yes    VAD Patient? No    PAD/SET Patient? No      Pain Assessment   Currently in Pain? No/denies              Social History   Tobacco Use  Smoking Status Never Smoker  Smokeless Tobacco Never Used    Goals Met:  Independence with exercise equipment Exercise tolerated well No report of cardiac concerns or symptoms Strength training completed today  Goals Unmet:  Not Applicable  Comments: First full day of exercise!  Patient was oriented to gym and equipment including functions, settings, policies, and procedures.  Patient's individual exercise prescription and treatment plan were reviewed.  All starting workloads were established based on the results of the 6 minute walk test done at initial orientation visit.  The plan for exercise progression was also introduced and progression will be customized based on patient's performance and goals.    Dr. Emily Filbert is Medical Director for [place] and [name].

## 2020-09-05 ENCOUNTER — Encounter: Payer: Medicare PPO | Admitting: *Deleted

## 2020-09-05 ENCOUNTER — Other Ambulatory Visit: Payer: Self-pay

## 2020-09-05 DIAGNOSIS — J849 Interstitial pulmonary disease, unspecified: Secondary | ICD-10-CM

## 2020-09-05 NOTE — Progress Notes (Signed)
Daily Session Note  Patient Details  Name: Joyce Evans MRN: [id number] Date of Birth: 19-May-1941 Referring Provider:   Flowsheet Row Pulmonary Rehab from 09/02/2020 in [facility] Cardiac and Pulmonary Rehab  Referring Provider Kloefkorn      Encounter Date: 09/05/2020  Check In:  Session Check In - 09/05/20 1357      Check-In   Supervising physician immediately available to respond to emergencies See telemetry face sheet for immediately available ER MD    Location [facility]-Cardiac & Pulmonary Rehab    Staff Present Renita Papa, RN BSN;Joseph [address] [place], [place], [place], CCRP, CCET    Virtual Visit No    Medication changes reported     No    Fall or balance concerns reported    No    Warm-up and Cool-down Performed on first and last piece of equipment    Resistance Training Performed Yes    VAD Patient? No    PAD/SET Patient? No      Pain Assessment   Currently in Pain? No/denies              Social History   Tobacco Use  Smoking Status Never Smoker  Smokeless Tobacco Never Used    Goals Met:  Independence with exercise equipment Exercise tolerated well No report of cardiac concerns or symptoms Strength training completed today  Goals Unmet:  Not Applicable  Comments: Pt able to follow exercise prescription today without complaint.  Will continue to monitor for progression. Reviewed home exercise with pt today.  Pt plans to use the [place] machine for exercise.  Reviewed THR, pulse, RPE, sign and symptoms, pulse oximetery and when to call 911 or MD.  Also discussed weather considerations and indoor options.  Pt voiced understanding.    Dr. Emily Filbert is Medical Director for [place] and [name].

## 2020-09-09 ENCOUNTER — Other Ambulatory Visit: Payer: Self-pay

## 2020-09-09 DIAGNOSIS — J849 Interstitial pulmonary disease, unspecified: Secondary | ICD-10-CM

## 2020-09-09 NOTE — Progress Notes (Signed)
Daily Session Note  Patient Details  Name: Joyce Evans MRN: [id number] Date of Birth: 04/30/1941 Referring Provider:   Flowsheet Row Pulmonary Rehab from 09/02/2020 in [facility] Cardiac and Pulmonary Rehab  Referring Provider Kloefkorn      Encounter Date: 09/09/2020  Check In:  Session Check In - 09/09/20 1406      Check-In   Supervising physician immediately available to respond to emergencies See telemetry face sheet for immediately available ER MD    Location [facility]-Cardiac & Pulmonary Rehab    Staff Present Birdie Sons, MPA, [place]icia [place], BS, ACSM CEP, Exercise Physiologist;Kara Eliezer Bottom, MS Exercise Physiologist    Virtual Visit No    Medication changes reported     No    Fall or balance concerns reported    No    Tobacco Cessation No Change    Warm-up and Cool-down Performed on first and last piece of equipment    Resistance Training Performed Yes    VAD Patient? No    PAD/SET Patient? No      Pain Assessment   Currently in Pain? No/denies              Social History   Tobacco Use  Smoking Status Never Smoker  Smokeless Tobacco Never Used    Goals Met:  Independence with exercise equipment Exercise tolerated well No report of cardiac concerns or symptoms Strength training completed today  Goals Unmet:  Not Applicable  Comments: Pt able to follow exercise prescription today without complaint.  Will continue to monitor for progression.    Dr. Emily Filbert is Medical Director for [place] and [name].

## 2020-09-11 ENCOUNTER — Encounter: Payer: Medicare PPO | Attending: Internal Medicine

## 2020-09-11 ENCOUNTER — Other Ambulatory Visit: Payer: Self-pay

## 2020-09-11 DIAGNOSIS — J849 Interstitial pulmonary disease, unspecified: Secondary | ICD-10-CM | POA: Diagnosis not present

## 2020-09-11 NOTE — Progress Notes (Signed)
Daily Session Note  Patient Details  Name: Joyce Evans MRN: [id number] Date of Birth: 06/12/1941 Referring Provider:   Flowsheet Row Pulmonary Rehab from 09/02/2020 in [facility] Cardiac and Pulmonary Rehab  Referring Provider Kloefkorn      Encounter Date: 09/11/2020  Check In:  Session Check In - 09/11/20 1428      Check-In   Supervising physician immediately available to respond to emergencies See telemetry face sheet for immediately available ER MD    Location [facility]-Cardiac & Pulmonary Rehab    Staff Present Kelly Bollinger, MPA, RN;Joseph Hood RCP,RRT,BSRT;Kara Langdon, MS Exercise Physiologist    Virtual Visit No    Medication changes reported     No    Fall or balance concerns reported    No    Tobacco Cessation No Change    Warm-up and Cool-down Performed on first and last piece of equipment    Resistance Training Performed Yes    VAD Patient? No    PAD/SET Patient? No      Pain Assessment   Currently in Pain? No/denies              Social History   Tobacco Use  Smoking Status Never Smoker  Smokeless Tobacco Never Used    Goals Met:  Independence with exercise equipment Exercise tolerated well No report of cardiac concerns or symptoms Strength training completed today  Goals Unmet:  Not Applicable  Comments: Pt able to follow exercise prescription today without complaint.  Will continue to monitor for progression.    Dr. Mark Miller is Medical Director for [facility] and [name]. 

## 2020-09-12 ENCOUNTER — Encounter: Payer: Medicare PPO | Admitting: *Deleted

## 2020-09-12 ENCOUNTER — Other Ambulatory Visit: Payer: Self-pay

## 2020-09-12 DIAGNOSIS — J849 Interstitial pulmonary disease, unspecified: Secondary | ICD-10-CM | POA: Diagnosis not present

## 2020-09-12 NOTE — Progress Notes (Signed)
Daily Session Note  Patient Details  Name: Joyce Evans MRN: [id number] Date of Birth: 03/30/1941 Referring Provider:   Flowsheet Row Pulmonary Rehab from 09/02/2020 in [facility] Cardiac and Pulmonary Rehab  Referring Provider Kloefkorn      Encounter Date: 09/12/2020  Check In:  Session Check In - 09/12/20 1404      Check-In   Supervising physician immediately available to respond to emergencies See telemetry face sheet for immediately available ER MD    Location [facility]-Cardiac & Pulmonary Rehab    Staff Present Renita Papa, RN BSN;Joseph [address] [place], [place], [place], CCRP, CCET    Virtual Visit No    Medication changes reported     No    Fall or balance concerns reported    No    Warm-up and Cool-down Performed on first and last piece of equipment    Resistance Training Performed Yes    VAD Patient? No    PAD/SET Patient? No      Pain Assessment   Currently in Pain? No/denies              Social History   Tobacco Use  Smoking Status Never Smoker  Smokeless Tobacco Never Used    Goals Met:  Proper associated with RPD/PD & O2 Sat Exercise tolerated well No report of cardiac concerns or symptoms Strength training completed today  Goals Unmet:  Not Applicable  Comments: Pt able to follow exercise prescription today without complaint.  Will continue to monitor for progression.    Dr. Emily Filbert is Medical Director for [place] and [name].

## 2020-09-30 ENCOUNTER — Encounter: Payer: Medicare PPO | Admitting: Dermatology

## 2020-10-02 ENCOUNTER — Encounter: Payer: Self-pay | Admitting: *Deleted

## 2020-10-02 DIAGNOSIS — J849 Interstitial pulmonary disease, unspecified: Secondary | ICD-10-CM

## 2020-10-02 NOTE — Progress Notes (Signed)
Pulmonary Individual Treatment Plan  Patient Details  Name: Joyce Evans MRN: [id number] Date of Birth: [birth year] Referring Provider:   Flowsheet Row Pulmonary Rehab from 09/02/2020 in [facility] Cardiac and Pulmonary Rehab  Referring Provider Kloefkorn      Initial Encounter Date:  Flowsheet Row Pulmonary Rehab from 09/02/2020 in [facility] Cardiac and Pulmonary Rehab  Date 09/02/20      Visit Diagnosis: ILD (interstitial lung disease) ([place])  Patient's Home Medications on Admission:  Current Outpatient Medications:  .  acetaminophen (TYLENOL) 500 MG tablet, Take 500 mg by mouth every 6 (six) hours as needed., Disp: , Rfl:  .  amLODipine (NORVASC) 5 MG tablet, Take 5 mg by mouth daily., Disp: , Rfl:  .  apixaban (ELIQUIS) 5 MG TABS tablet, Take 5 mg by mouth 2 (two) times daily. , Disp: , Rfl:  .  atorvastatin (LIPITOR) 40 MG tablet, Take 1 tablet (40 mg total) by mouth daily., Disp: 30 tablet, Rfl: 3 .  atorvastatin (LIPITOR) 40 MG tablet, Take by mouth., Disp: , Rfl:  .  azelastine (ASTELIN) 0.1 % nasal spray, Place 2 sprays into both nostrils 2 (two) times daily. (Patient not taking: Reported on 08/29/2020), Disp: , Rfl:  .  brimonidine (ALPHAGAN P) 0.1 % SOLN, Place 1 drop into the right eye 2 (two) times daily., Disp: , Rfl:  .  Brinzolamide-Brimonidine 1-0.2 % SUSP, Place 1 drop into the left eye 2 (two) times daily., Disp: , Rfl:  .  cetirizine (ZYRTEC) 10 MG tablet, Take 10 mg by mouth daily. (Patient not taking: Reported on 08/29/2020), Disp: , Rfl:  .  Cholecalciferol (VITAMIN D3) 3000 units TABS, Take 3,000-6,000 Units by mouth 2 (two) times daily. , Disp: , Rfl:  .  Cholecalciferol 50 MCG (2000 UT) CAPS, Take by mouth., Disp: , Rfl:  .  fluticasone (FLONASE) 50 MCG/ACT nasal spray, Place 2 sprays into the nose daily., Disp: , Rfl:  .  furosemide (LASIX) 20 MG tablet, Take 20 mg by mouth daily., Disp: , Rfl:  .  furosemide (LASIX) 20 MG tablet, Take by mouth. (Patient not taking:  Reported on 08/29/2020), Disp: , Rfl:  .  losartan (COZAAR) 100 MG tablet, Take 100 mg by mouth daily., Disp: , Rfl:  .  multivitamin-iron-minerals-folic acid (CENTRUM) chewable tablet, Chew 1 tablet by mouth daily., Disp: , Rfl:  .  omeprazole (PRILOSEC) 20 MG capsule, Take 20 mg by mouth 2 (two) times daily. Reported on 08/28/2015 (Patient not taking: Reported on 08/29/2020), Disp: , Rfl:  .  omeprazole (PRILOSEC) 40 MG capsule, Take by mouth., Disp: , Rfl:  .  Probiotic Product (PROBIOTIC DAILY PO), Take by mouth daily., Disp: , Rfl:  .  spironolactone (ALDACTONE) 25 MG tablet, Take 12.5 mg by mouth daily. (Patient not taking: Reported on 08/29/2020), Disp: , Rfl:  .  timolol (BETIMOL) 0.5 % ophthalmic solution, Place 1 drop into both eyes daily., Disp: , Rfl:  .  Zinc Citrate-Phytase (ZYTAZE) 25-500 MG CAPS, Take by mouth., Disp: , Rfl:   Past Medical History: Past Medical History:  Diagnosis Date  . Arthritis    osteo - shoulders, fingers  . Atrial fibrillation ([place])   . Dizziness    thinks because of diuretic  . Family history of adverse reaction to anesthesia    daughter -PONV  . Gallstones 2016, 2017  . GERD (gastroesophageal reflux disease)   . Glaucoma   . Heart murmur    history of  . Hypertension   .  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 Excised: 05/19/2016, margins free.  . Numbness in left leg    s/p hematoma  . Sciatica    right  . Seasonal allergies   . Skin cancer   . 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 Tricuspid valve disorder    leak    Tobacco Use: Social History   Tobacco Use  Smoking Status Never Smoker  Smokeless Tobacco Never Used    Labs: Recent Review Flowsheet Data    Labs for ITP Cardiac and Pulmonary Rehab Latest Ref Rng & Units 06/17/2020 06/18/2020   Cholestrol 0 - 200 mg/dL - 184   LDLCALC 0 - 99 mg/dL - 122(H)   LDLDIRECT 0 - 99 mg/dL 133.5(H) -   HDL  >40 mg/dL - 51   Trlycerides <150 mg/dL - 57   Hemoglobin A1c 4.8 - 5.6 % 5.4 -       Pulmonary Assessment Scores:  Pulmonary Assessment Scores    Row Name 09/02/20 1106         ADL UCSD   SOB Score total 48     Rest 1     Walk 2     Stairs 4     Bath 0     Dress 1     Shop 3           CAT Score   CAT Score 18           mMRC Score   mMRC Score 2            UCSD: Self-administered rating of dyspnea associated with activities of daily living (ADLs) 6-point scale (0 = "not at all" to 5 = "maximal or unable to do because of breathlessness")  Scoring Scores range from 0 to 120.  Minimally important difference is 5 units  CAT: CAT can identify the health impairment of COPD patients and is better correlated with disease progression.  CAT has a scoring range of zero to 40. The CAT score is classified into four groups of low (less than 10), medium (10 - 20), high (21-30) and very high (31-40) based on the impact level of disease on health status. A CAT score over 10 suggests significant symptoms.  A worsening CAT score could be explained by an exacerbation, poor medication adherence, poor inhaler technique, or progression of COPD or comorbid conditions.  CAT MCID is 2 points  mMRC: mMRC (Modified Medical Research Council) Dyspnea Scale is used to assess the degree of baseline functional disability in patients of respiratory disease due to dyspnea. No minimal important difference is established. A decrease in score of 1 point or greater is considered a positive change.   Pulmonary Function Assessment:  Pulmonary Function Assessment - 08/29/20 0913      Breath   Shortness of Breath Yes;Limiting activity           Exercise Target Goals: Exercise Program Goal: Individual exercise prescription set using results from initial 6 min walk test and THRR while considering  patient's activity barriers and safety.   Exercise Prescription Goal: Initial exercise prescription  builds to 30-45 minutes a day of aerobic activity, 2-3 days per week.  Home exercise guidelines will be given to patient during program as part of exercise prescription that the participant will acknowledge.  Education: Aerobic Exercise: - Group verbal and visual presentation on the components of exercise prescription. Introduces  F.I.T.T principle from ACSM for exercise prescriptions.  Reviews F.I.T.T. principles of aerobic exercise including progression. Written material given at graduation.   Education: Resistance Exercise: - Group verbal and visual presentation on the components of exercise prescription. Introduces F.I.T.T principle from ACSM for exercise prescriptions  Reviews F.I.T.T. principles of resistance exercise including progression. Written material given at graduation.    Education: Exercise & Equipment Safety: - Individual verbal instruction and demonstration of equipment use and safety with use of the equipment. Flowsheet Row Pulmonary Rehab from 09/11/2020 in [facility] Cardiac and Pulmonary Rehab  Date 09/02/20  Educator AS  Instruction Review Code 1- Verbalizes Understanding      Education: Exercise Physiology & General Exercise Guidelines: - Group verbal and written instruction with models to review the exercise physiology of the cardiovascular system and associated critical values. Provides general exercise guidelines with specific guidelines to those with heart or lung disease.  Flowsheet Row Cardiac Rehab from 05/30/2018 in [facility] Cardiac and Pulmonary Rehab  Date 04/06/18  Educator Nada Maclachlan, EP  Instruction Review Code 1- Verbalizes Understanding      Education: Flexibility, Balance, Mind/Body Relaxation: - Group verbal and visual presentation with interactive activity on the components of exercise prescription. Introduces F.I.T.T principle from ACSM for exercise prescriptions. Reviews F.I.T.T. principles of flexibility and balance exercise training including  progression. Also discusses the mind body connection.  Reviews various relaxation techniques to help reduce and manage stress (i.e. Deep breathing, progressive muscle relaxation, and visualization). Balance handout provided to take home. Written material given at graduation. Flowsheet Row Pulmonary Rehab from 09/11/2020 in [facility] Cardiac and Pulmonary Rehab  Date 09/11/20  Educator AS  Instruction Review Code 1- Verbalizes Understanding      Activity Barriers & Risk Stratification:   6 Minute Walk:  6 Minute Walk    Row Name 09/02/20 1056         6 Minute Walk   Phase Initial     Distance 950 feet     Walk Time 6 minutes     # of Rest Breaks 0     MPH 1.8     METS 1.95     RPE 12     Perceived Dyspnea  1     VO2 Peak 6.82     Symptoms No     Resting HR 69 bpm     Resting BP 142/68     Resting Oxygen Saturation  95 %     Exercise Oxygen Saturation  during 6 min walk 85 %     Max Ex. HR 111 bpm     Max Ex. BP 164/78     2 Minute Post BP 144/68           Interval HR   1 Minute HR 90     2 Minute HR 81     3 Minute HR 105     4 Minute HR 109     5 Minute HR 111     6 Minute HR 103     2 Minute Post HR 98     Interval Heart Rate? Yes           Interval Oxygen   Interval Oxygen? Yes     Baseline Oxygen Saturation % 95 %     1 Minute Oxygen Saturation % 90 %     1 Minute Liters of Oxygen 4 L  pulsed     2 Minute Oxygen Saturation % 89 %  2 Minute Liters of Oxygen 4 L  pulsed     3 Minute Oxygen Saturation % 88 %     3 Minute Liters of Oxygen 4 L  pulsed     4 Minute Oxygen Saturation % 87 %     4 Minute Liters of Oxygen 4 L  pulsed     5 Minute Oxygen Saturation % 85 %     5 Minute Liters of Oxygen 4 L  pulsed     6 Minute Oxygen Saturation % 85 %     6 Minute Liters of Oxygen 4 L  pulsed     2 Minute Post Oxygen Saturation % 88 %     2 Minute Post Liters of Oxygen 4 L  pulsed           Oxygen Initial Assessment:  Oxygen Initial Assessment - 08/29/20 0911       Home Oxygen   Home Oxygen Device Portable Concentrator;Home Concentrator;E-Tanks    Sleep Oxygen Prescription Continuous   PRN   Liters per minute 2   PRN   Home Exercise Oxygen Prescription Continuous    Liters per minute 2   PRN   Home Resting Oxygen Prescription Continuous    Liters per minute 2   PRN   Compliance with Home Oxygen Use Yes      Initial 6 min Walk   Oxygen Used None      Program Oxygen Prescription   Program Oxygen Prescription None      Intervention   Short Term Goals To learn and exhibit compliance with exercise, home and travel O2 prescription;To learn and understand importance of monitoring SPO2 with pulse oximeter and demonstrate accurate use of the pulse oximeter.;To learn and understand importance of maintaining oxygen saturations>88%;To learn and demonstrate proper pursed lip breathing techniques or other breathing techniques.;To learn and demonstrate proper use of respiratory medications    Long  Term Goals Exhibits compliance with exercise, home and travel O2 prescription;Verbalizes importance of monitoring SPO2 with pulse oximeter and return demonstration;Maintenance of O2 saturations>88%;Exhibits proper breathing techniques, such as pursed lip breathing or other method taught during program session;Compliance with respiratory medication;Demonstrates proper use of MDI's           Oxygen Re-Evaluation:   Oxygen Discharge (Final Oxygen Re-Evaluation):   Initial Exercise Prescription:  Initial Exercise Prescription - 09/02/20 1100      Date of Initial Exercise RX and Referring Provider   Date 09/02/20    Referring Provider Kloefkorn      Treadmill   MPH 1.5    Grade 0    Minutes 15    METs 2      Recumbant Bike   Level 1    RPM 60    Minutes 15    METs 2      NuStep   Level 2    SPM 80    Minutes 15    METs 2      Recumbant Elliptical   Level 1    RPM 50    Minutes 15    METs 2      REL-XR   Level 2    Watts 50     Minutes 15    METs 2      Prescription Details   Frequency (times per week) 3    Duration Progress to 30 minutes of continuous aerobic without signs/symptoms of physical distress      Intensity   THRR 40-80% of Max  Heartrate 98-127    Ratings of Perceived Exertion 11-13      Progression   Progression Continue progressive overload as per policy without signs/symptoms or physical distress.      Resistance Training   Training Prescription Yes    Weight 3 lb    Reps 10-15           Perform Capillary Blood Glucose checks as needed.  Exercise Prescription Changes:  Exercise Prescription Changes    Row Name 09/02/20 1100 09/05/20 1400 09/09/20 1600 09/23/20 1200       Response to Exercise   Blood Pressure (Admit) 142/68 -- 110/64 142/82    Blood Pressure (Exercise) 164/78 -- 120/70 138/64    Blood Pressure (Exit) 144/68 -- 122/56 124/72    Heart Rate (Admit) 69 bpm -- 62 bpm 96 bpm    Heart Rate (Exercise) 111 bpm -- 68 bpm 99 bpm    Heart Rate (Exit) 82 bpm -- 57 bpm 70 bpm    Oxygen Saturation (Admit) -- -- 93 % 92 %    Oxygen Saturation (Exercise) -- -- 100 % 98 %    Oxygen Saturation (Exit) -- -- 98 % 95 %    Rating of Perceived Exertion (Exercise) 12 -- 13 14    Perceived Dyspnea (Exercise) 1 -- 2 3    Symptoms none -- SOB SOB    Duration -- -- Continue with 30 min of aerobic exercise without signs/symptoms of physical distress. Continue with 30 min of aerobic exercise without signs/symptoms of physical distress.    Intensity -- -- THRR unchanged THRR unchanged         Progression   Progression -- -- Continue to progress workloads to maintain intensity without signs/symptoms of physical distress. Continue to progress workloads to maintain intensity without signs/symptoms of physical distress.    Average METs -- -- 1.91 2.98         Resistance Training   Training Prescription -- -- Yes Yes    Weight -- -- 3 lb 3 lb    Reps -- -- 10-15 10-15         Interval  Training   Interval Training -- -- No No         Treadmill   MPH -- -- 1.5 1.5    Grade -- -- 0 0    Minutes -- -- 15 15    METs -- -- 2.15 2.15         NuStep   Level -- -- 3 --    Minutes -- -- 15 --    METs -- -- 2 --         Recumbant Elliptical   Level -- -- 1 --    Minutes -- -- 15 --    METs -- -- 1.5 --         REL-XR   Level -- -- -- 2    Watts -- -- -- 5    Minutes -- -- -- 15         Biostep-RELP   Level -- -- 2 --    Minutes -- -- 15 --    METs -- -- 2 --         Home Exercise Plan   Plans to continue exercise at -- Home (comment)  has [place] (comment)  has Programmer, applications --    Frequency -- Add 3 additional days to program exercise sessions. Add 3 additional days to program exercise sessions. --  Initial Home Exercises Provided -- 09/05/20 09/05/20 --           Exercise Comments:  Exercise Comments    Row Name 09/04/20 1418           Exercise Comments First full day of exercise!  Patient was oriented to gym and equipment including functions, settings, policies, and procedures.  Patient's individual exercise prescription and treatment plan were reviewed.  All starting workloads were established based on the results of the 6 minute walk test done at initial orientation visit.  The plan for exercise progression was also introduced and progression will be customized based on patient's performance and goals.              Exercise Goals and Review:  Exercise Goals    Row Name 09/02/20 1104             Exercise Goals   Increase Physical Activity Yes       Intervention Provide advice, education, support and counseling about physical activity/exercise needs.;Develop an individualized exercise prescription for aerobic and resistive training based on initial evaluation findings, risk stratification, comorbidities and participant's personal goals.       Expected Outcomes Short Term: Attend rehab on a regular basis to increase amount  of physical activity.;Long Term: Add in home exercise to make exercise part of routine and to increase amount of physical activity.;Long Term: Exercising regularly at least 3-5 days a week.       Increase Strength and Stamina Yes       Intervention Provide advice, education, support and counseling about physical activity/exercise needs.;Develop an individualized exercise prescription for aerobic and resistive training based on initial evaluation findings, risk stratification, comorbidities and participant's personal goals.       Expected Outcomes Short Term: Increase workloads from initial exercise prescription for resistance, speed, and METs.;Short Term: Perform resistance training exercises routinely during rehab and add in resistance training at home;Long Term: Improve cardiorespiratory fitness, muscular endurance and strength as measured by increased METs and functional capacity (6MWT)       Able to understand and use rate of perceived exertion (RPE) scale Yes       Intervention Provide education and explanation on how to use RPE scale       Expected Outcomes Short Term: Able to use RPE daily in rehab to express subjective intensity level;Long Term:  Able to use RPE to guide intensity level when exercising independently       Able to understand and use Dyspnea scale Yes       Intervention Provide education and explanation on how to use Dyspnea scale       Expected Outcomes Short Term: Able to use Dyspnea scale daily in rehab to express subjective sense of shortness of breath during exertion;Long Term: Able to use Dyspnea scale to guide intensity level when exercising independently       Knowledge and understanding of Target Heart Rate Range (THRR) Yes       Intervention Provide education and explanation of THRR including how the numbers were predicted and where they are located for reference       Expected Outcomes Short Term: Able to use daily as guideline for intensity in rehab;Short Term: Able to  state/look up THRR;Long Term: Able to use THRR to govern intensity when exercising independently       Able to check pulse independently Yes       Intervention Provide education and demonstration on how to check pulse in  carotid and radial arteries.;Review the importance of being able to check your own pulse for safety during independent exercise       Expected Outcomes Short Term: Able to explain why pulse checking is important during independent exercise;Long Term: Able to check pulse independently and accurately       Understanding of Exercise Prescription Yes       Intervention Provide education, explanation, and written materials on patient's individual exercise prescription       Expected Outcomes Short Term: Able to explain program exercise prescription;Long Term: Able to explain home exercise prescription to exercise independently              Exercise Goals Re-Evaluation :  Exercise Goals Re-Evaluation    Row Name 09/04/20 1419 09/05/20 1442 09/09/20 1622 09/23/20 1219       Exercise Goal Re-Evaluation   Exercise Goals Review Increase Physical Activity;Knowledge and understanding of Target Heart Rate Range (THRR);Understanding of Exercise Prescription;Able to understand and use rate of perceived exertion (RPE) scale;Increase Strength and Stamina;Able to understand and use Dyspnea scale;Able to check pulse independently Increase Physical Activity;Increase Strength and Stamina Increase Physical Activity;Increase Strength and Stamina;Understanding of Exercise Prescription Increase Physical Activity;Increase Strength and Stamina    Comments Reviewed RPE and dyspnea scales, THR and program prescription with pt today.  Pt voiced understanding and was given a copy of goals to take home. Reviewed home exercise with pt today.  Pt plans to use the [place] machine for exercise.  Reviewed THR, pulse, RPE, sign and symptoms, pulse oximetery and when to call 911 or MD.  Also discussed weather  considerations and indoor options.  Pt voiced understanding. Rita is off to a good start in rehab.  She has completed her first three full days of exercise and already exercising on her own at home.  She is getting in her full 30 min already too!  We will continue to monitor her progress. Judys oxygen and BP have been in good range during exercise.  Staff will encourage trying 4 lb weights.    Expected Outcomes Short: Use RPE daily to regulate intensity. Long: Follow program prescription in THR. Short: monitor HR and oxygen while exercising at home  Long: improve MET level Short: Continue to attend rehab regularly Long: Continue to improve stamina. Short: move up to 4 lb weights Long:  improve overall stamina           Discharge Exercise Prescription (Final Exercise Prescription Changes):  Exercise Prescription Changes - 09/23/20 1200      Response to Exercise   Blood Pressure (Admit) 142/82    Blood Pressure (Exercise) 138/64    Blood Pressure (Exit) 124/72    Heart Rate (Admit) 96 bpm    Heart Rate (Exercise) 99 bpm    Heart Rate (Exit) 70 bpm    Oxygen Saturation (Admit) 92 %    Oxygen Saturation (Exercise) 98 %    Oxygen Saturation (Exit) 95 %    Rating of Perceived Exertion (Exercise) 14    Perceived Dyspnea (Exercise) 3    Symptoms SOB    Duration Continue with 30 min of aerobic exercise without signs/symptoms of physical distress.    Intensity THRR unchanged      Progression   Progression Continue to progress workloads to maintain intensity without signs/symptoms of physical distress.    Average METs 2.98      Resistance Training   Training Prescription Yes    Weight 3 lb    Reps 10-15  Interval Training   Interval Training No      Treadmill   MPH 1.5    Grade 0    Minutes 15    METs 2.15      REL-XR   Level 2    Watts 5    Minutes 15           Nutrition:  Target Goals: Understanding of nutrition guidelines, daily intake of sodium <1553m, cholesterol  <204m calories 30% from fat and 7% or less from saturated fats, daily to have 5 or more servings of fruits and vegetables.  Education: All About Nutrition: -Group instruction provided by verbal, written material, interactive activities, discussions, models, and posters to present general guidelines for heart healthy nutrition including fat, fiber, MyPlate, the role of sodium in heart healthy nutrition, utilization of the nutrition label, and utilization of this knowledge for meal planning. Follow up email sent as well. Written material given at graduation. [facility]  Date 05/16/18  Educator LB  Instruction Review Code 1- Verbalizes Understanding      Biometrics:  Pre Biometrics - 09/02/20 1105      Pre Biometrics   Height 5' 2.75" (1.594 m)    Weight 176 lb 3.2 oz (79.9 kg)    BMI (Calculated) 31.46            Nutrition Therapy Plan and Nutrition Goals:  Nutrition Therapy & Goals - 09/02/20 1020      Nutrition Therapy   Diet Heart healthy, low Na, pulmonary MNT    Drug/Food Interactions Statins/Certain Fruits    Protein (specify units) 80g    Fiber 25 grams    Whole Grain Foods 3 servings    Saturated Fats 12 max. grams    Fruits and Vegetables 8 servings/day    Sodium 1.5 grams      Personal Nutrition Goals   Nutrition Goal ST: Do not add salt to home cooked meals, eat 6 small meals/day LT: limit eating out to 2-3x/week, do not cook with salt, improve breathing with meals.    Comments She has been getting Hello Fresh. She is going on a Cruise soon in March. She believes her problem is portion control. B: egg and cheese wrap - dunkin doughnuts or bran flake cereal with blueberries L: She reports eating out a lot for lunch - taco bell - bean burrito supreme and plain taco, [employer]Polandestaurants, red lobster (salmon, brussels sprouts). D: leftovers, hello fresh, or fixes a meal herself - avocado oil, green  beans, sea salt, turnip greens, spinach, roasted carrots, brussells sprouts, pork chops, salmon cakes. Drinks: coffee (decaf, caramel macchiado creamer) or green tea and lots of water. Uses benecol instead of butter. She reports liking cheese - havarti, munster, swiss, goSaint BarthelemyShe reports eating a lot of peanut butter (natural Jiff). Uses almond milk. She reports eating beans with corn sometimes. She is still strugglin with heartburn. Reviewed heart healthy eating and pulmonary freindly eating. Plan to reduce salt at home, eat smaller -more frequent meals to help with breathing, then reduce frequency of eating out.      Intervention Plan   Intervention Prescribe, educate and counsel regarding individualized specific dietary modifications aiming towards targeted core components such as weight, hypertension, lipid management, diabetes, heart failure and other comorbidities.;Nutrition handout(s) given to patient.    Expected Outcomes Short Term Goal: Understand basic principles of dietary content, such as calories, fat, sodium, cholesterol and nutrients.;Short Term Goal:  A plan has been developed with personal nutrition goals set during dietitian appointment.;Long Term Goal: Adherence to prescribed nutrition plan.           Nutrition Assessments:  MEDIFICTS Score Key:  ?70 Need to make dietary changes   40-70 Heart Healthy Diet  ? 40 Therapeutic Level Cholesterol Diet  Flowsheet Row Pulmonary Rehab from 09/02/2020 in [facility] Cardiac and Pulmonary Rehab  Picture Your Plate Total Score on Admission 77     Picture Your Plate Scores:  <35 Unhealthy dietary pattern with much room for improvement.  41-50 Dietary pattern unlikely to meet recommendations for good health and room for improvement.  51-60 More healthful dietary pattern, with some room for improvement.   >60 Healthy dietary pattern, although there may be some specific behaviors that could be improved.   Nutrition Goals  Re-Evaluation:   Nutrition Goals Discharge (Final Nutrition Goals Re-Evaluation):   Psychosocial: Target Goals: Acknowledge presence or absence of significant depression and/or stress, maximize coping skills, provide positive support system. Participant is able to verbalize types and ability to use techniques and skills needed for reducing stress and depression.   Education: Stress, Anxiety, and Depression - Group verbal and visual presentation to define topics covered.  Reviews how body is impacted by stress, anxiety, and depression.  Also discusses healthy ways to reduce stress and to treat/manage anxiety and depression.  Written material given at graduation. Flowsheet Row Cardiac Rehab from 05/30/2018 in [facility] Cardiac and Pulmonary Rehab  Date 04/13/18  Educator [facility]  Instruction Review Code 1- [employer] Understanding      Education: Sleep Hygiene -Provides group verbal and written instruction about how sleep can affect your health.  Define sleep hygiene, discuss sleep cycles and impact of sleep habits. Review good sleep hygiene tips.  Flowsheet Row Cardiac Rehab from 05/30/2018 in [facility] Cardiac and Pulmonary Rehab  Date 03/16/18  Educator [facility]  Instruction Review Code 1- Verbalizes Understanding      Initial Review & Psychosocial Screening:  Initial Psych Review & Screening - 08/29/20 0915      Initial Review   Current issues with Current Psychotropic Meds;Current Anxiety/Panic    Comments She sometimes is anxious and is a positive person in general.      Family Dynamics   Good Support System? Yes    Comments Maevyn can look to her husband and her two daughters for support.      Barriers   Psychosocial barriers to participate in program The patient should benefit from training in stress management and relaxation.      Screening Interventions   Interventions To provide support and resources with identified psychosocial needs;Encouraged to exercise;Provide feedback about the  scores to participant    Expected Outcomes Short Term goal: Utilizing psychosocial counselor, staff and physician to assist with identification of specific Stressors or current issues interfering with healing process. Setting desired goal for each stressor or current issue identified.;Long Term Goal: Stressors or current issues are controlled or eliminated.;Short Term goal: Identification and review with participant of any Quality of Life or Depression concerns found by scoring the questionnaire.;Long Term goal: The participant improves quality of Life and PHQ9 Scores as seen by post scores and/or verbalization of changes           Quality of Life Scores:  Scores of 19 and below usually indicate a poorer quality of life in these areas.  A difference of  2-3 points is a clinically meaningful difference.  A difference of 2-3 points in  the total score of the Quality of Life Index has been associated with significant improvement in overall quality of life, self-image, physical symptoms, and general health in studies assessing change in quality of life.  PHQ-9: Recent Review Flowsheet Data    Depression screen [facility] 2/9 09/02/2020 05/30/2018 03/08/2018   Decreased Interest 0 0 1   Down, Depressed, Hopeless 0 0 0   PHQ - 2 Score 0 0 1   Altered sleeping - - 0   Tired, decreased energy _0 Change in appetite 1 0 0   Feeling bad or failure about yourself  0 0 0   Trouble concentrating 0 0 0   Moving slowly or fidgety/restless 0 0 0   Suicidal thoughts 0 0 0   PHQ-9 Score - - 2   Difficult doing work/chores Not difficult at all - Not difficult at all     Interpretation of Total Score  Total Score Depression Severity:  1-4 = Minimal depression, 5-9 = Mild depression, 10-14 = Moderate depression, 15-19 = Moderately severe depression, 20-27 = Severe depression   Psychosocial Evaluation and Intervention:  Psychosocial Evaluation - 08/29/20 0917      Psychosocial Evaluation & Interventions    Interventions Encouraged to exercise with the program and follow exercise prescription;Relaxation education;Stress management education    Comments She sometimes is anxious and is a positive person in general.Bernarda can look to her husband and her two daughters for support.    Expected Outcomes Short: Exercise regularly to support mental health and notify staff of any changes. Long: maintain mental health and well being through teaching of rehab or prescribed medications independently.    Continue Psychosocial Services  Follow up required by staff           Psychosocial Re-Evaluation:   Psychosocial Discharge (Final Psychosocial Re-Evaluation):   Education: Education Goals: Education classes will be provided on a weekly basis, covering required topics. Participant will state understanding/return demonstration of topics presented.  Learning Barriers/Preferences:  Learning Barriers/Preferences - 08/29/20 0910      Learning Barriers/Preferences   Learning Barriers None    Learning Preferences None           General Pulmonary Education Topics:  Infection Prevention: - Provides verbal and written material to individual with discussion of infection control including proper hand washing and proper equipment cleaning during exercise session. Flowsheet Row Pulmonary Rehab from 09/11/2020 in [facility] Cardiac and Pulmonary Rehab  Date 09/02/20  Educator As  Instruction Review Code 1- Verbalizes Understanding      Falls Prevention: - Provides verbal and written material to individual with discussion of falls prevention and safety. Flowsheet Row Pulmonary Rehab from 08/29/2020 in [facility] Cardiac and Pulmonary Rehab  Date 08/29/20  Educator [facility]  Instruction Review Code 1- Verbalizes Understanding      Chronic Lung Disease Review: - Group verbal instruction with posters, models, PowerPoint presentations and videos,  to review new updates, new respiratory medications, new advancements in  procedures and treatments. Providing information on websites and "800" numbers for continued self-education. Includes information about supplement oxygen, available portable oxygen systems, continuous and intermittent flow rates, oxygen safety, concentrators, and Medicare reimbursement for oxygen. Explanation of Pulmonary Drugs, including class, frequency, complications, importance of spacers, rinsing mouth after steroid MDI's, and proper cleaning methods for nebulizers. Review of basic lung anatomy and physiology related to function, structure, and complications of lung disease. Review of risk factors. Discussion about methods for diagnosing sleep apnea and types of  masks and machines for OSA. Includes a review of the use of types of environmental controls: home humidity, furnaces, filters, dust mite/pet prevention, HEPA vacuums. Discussion about weather changes, air quality and the benefits of nasal washing. Instruction on Warning signs, infection symptoms, calling MD promptly, preventive modes, and value of vaccinations. Review of effective airway clearance, coughing and/or vibration techniques. Emphasizing that all should Create an Action Plan. Written material given at graduation.   AED/CPR: - Group verbal and written instruction with the use of models to demonstrate the basic use of the AED with the basic ABC's of resuscitation.    Anatomy and Cardiac Procedures: - Group verbal and visual presentation and models provide information about basic cardiac anatomy and function. Reviews the testing methods done to diagnose heart disease and the outcomes of the test results. Describes the treatment choices: Medical Management, Angioplasty, or Coronary Bypass Surgery for treating various heart conditions including Myocardial Infarction, Angina, Valve Disease, and Cardiac Arrhythmias.  Written material given at graduation. Flowsheet Row Cardiac Rehab from 05/30/2018 in [facility] Cardiac and Pulmonary Rehab  Date  04/25/18  Educator [place]  Instruction Review Code 1- Verbalizes Understanding      Medication Safety: - Group verbal and visual instruction to review commonly prescribed medications for heart and lung disease. Reviews the medication, class of the drug, and side effects. Includes the steps to properly store meds and maintain the prescription regimen.  Written material given at graduation.   Other: -Provides group and verbal instruction on various topics (see comments)   Knowledge Questionnaire Score:  Knowledge Questionnaire Score - 09/02/20 1109      Knowledge Questionnaire Score   Pre Score 16/18 inhaler            Core Components/Risk Factors/Patient Goals at Admission:  Personal Goals and Risk Factors at Admission - 09/02/20 1114      Core Components/Risk Factors/Patient Goals on Admission    Weight Management Yes;Weight Loss    Intervention Weight Management: Develop a combined nutrition and exercise program designed to reach desired caloric intake, while maintaining appropriate intake of nutrient and fiber, sodium and fats, and appropriate energy expenditure required for the weight goal.;Weight Management: Provide education and appropriate resources to help participant work on and attain dietary goals.;Weight Management/Obesity: Establish reasonable short term and long term weight goals.    Admit Weight 176 lb 3.2 oz (79.9 kg)    Goal Weight: Short Term 175 lb (79.4 kg)    Goal Weight: Long Term 170 lb (77.1 kg)    Expected Outcomes Short Term: Continue to assess and modify interventions until short term weight is achieved;Long Term: Adherence to nutrition and physical activity/exercise program aimed toward attainment of established weight goal;Weight Loss: Understanding of general recommendations for a balanced deficit meal plan, which promotes 1-2 lb weight loss per week and includes a negative energy balance of [phone] kcal/d;Understanding recommendations for meals to include  15-35% energy as protein, 25-35% energy from fat, 35-60% energy from carbohydrates, less than 222m of dietary cholesterol, 20-35 gm of total fiber daily;Understanding of distribution of calorie intake throughout the day with the consumption of 4-5 meals/snacks    Improve shortness of breath with ADL's Yes    Intervention Provide education, individualized exercise plan and daily activity instruction to help decrease symptoms of SOB with activities of daily living.    Expected Outcomes Short Term: Improve cardiorespiratory fitness to achieve a reduction of symptoms when performing ADLs;Long Term: Be able to perform more ADLs without symptoms or  delay the onset of symptoms    Heart Failure Yes    Intervention Provide a combined exercise and nutrition program that is supplemented with education, support and counseling about heart failure. Directed toward relieving symptoms such as shortness of breath, decreased exercise tolerance, and extremity edema.    Expected Outcomes Improve functional capacity of life;Short term: Attendance in program 2-3 days a week with increased exercise capacity. Reported lower sodium intake. Reported increased fruit and vegetable intake. Reports medication compliance.;Short term: Daily weights obtained and reported for increase. Utilizing diuretic protocols set by physician.;Long term: Adoption of self-care skills and reduction of barriers for early signs and symptoms recognition and intervention leading to self-care maintenance.    Hypertension Yes    Intervention Provide education on lifestyle modifcations including regular physical activity/exercise, weight management, moderate sodium restriction and increased consumption of fresh fruit, vegetables, and low fat dairy, alcohol moderation, and smoking cessation.;Monitor prescription use compliance.    Expected Outcomes Short Term: Continued assessment and intervention until BP is < 140/20m HG in hypertensive participants. <  130/861mHG in hypertensive participants with diabetes, heart failure or chronic kidney disease.;Long Term: Maintenance of blood pressure at goal levels.    Lipids Yes    Intervention Provide education and support for participant on nutrition & aerobic/resistive exercise along with prescribed medications to achieve LDL <7082mHDL >27m13m  Expected Outcomes Short Term: Participant states understanding of desired cholesterol values and is compliant with medications prescribed. Participant is following exercise prescription and nutrition guidelines.;Long Term: Cholesterol controlled with medications as prescribed, with individualized exercise RX and with personalized nutrition plan. Value goals: LDL < 70mg90mL > 40 mg.           Education:Diabetes - Individual verbal and written instruction to review signs/symptoms of diabetes, desired ranges of glucose level fasting, after meals and with exercise. Acknowledge that pre and post exercise glucose checks will be done for 3 sessions at entry of program.   Know Your Numbers and Heart Failure: - Group verbal and visual instruction to discuss disease risk factors for cardiac and pulmonary disease and treatment options.  Reviews associated critical values for Overweight/Obesity, Hypertension, Cholesterol, and Diabetes.  Discusses basics of heart failure: signs/symptoms and treatments.  Introduces Heart Failure Zone chart for action plan for heart failure.  Written material given at graduation.   Core Components/Risk Factors/Patient Goals Review:    Core Components/Risk Factors/Patient Goals at Discharge (Final Review):    ITP Comments:  ITP Comments    Row Name 08/29/20 0910 09/02/20 1119 09/04/20 0633 09/04/20 1418 10/02/20 0742   ITP Comments Virtual Visit completed. Patient informed on EP and RD appointment and 6 Minute walk test. Patient also informed of patient health questionnaires on My Chart. Patient Verbalizes understanding. Visit diagnosis  can be found in CH[facility]2/09/2020. Completed 6MWT and gym orientation. Initial ITP created and sent for review to Dr. Mark Emily Filbertical Director. 30 Day review completed. Medical Director ITP review done, changes made as directed, and signed approval by Medical Director.  New to program First full day of exercise!  Patient was oriented to gym and equipment including functions, settings, policies, and procedures.  Patient's individual exercise prescription and treatment plan were reviewed.  All starting workloads were established based on the results of the 6 minute walk test done at initial orientation visit.  The plan for exercise progression was also introduced and progression will be customized based on patient's performance and goals. 30 Day review completed. Medical Director  ITP review done, changes made as directed, and signed approval by Medical Director.          Comments:

## 2020-10-08 ENCOUNTER — Encounter: Payer: Self-pay | Admitting: *Deleted

## 2020-10-08 DIAGNOSIS — J849 Interstitial pulmonary disease, unspecified: Secondary | ICD-10-CM

## 2020-10-09 ENCOUNTER — Other Ambulatory Visit: Payer: Self-pay

## 2020-10-09 DIAGNOSIS — J849 Interstitial pulmonary disease, unspecified: Secondary | ICD-10-CM | POA: Diagnosis not present

## 2020-10-09 NOTE — Progress Notes (Signed)
Daily Session Note  Patient Details  Name: Joyce Evans MRN: [id number] Date of Birth: 1941/01/26 Referring Provider:   Flowsheet Row Pulmonary Rehab from 09/02/2020 in [facility] Cardiac and Pulmonary Rehab  Referring Provider Kloefkorn      Encounter Date: 10/09/2020  Check In:  Session Check In - 10/09/20 1410      Check-In   Supervising physician immediately available to respond to emergencies See telemetry face sheet for immediately available ER MD    Location [facility]-Cardiac & Pulmonary Rehab    Staff Present Birdie Sons, MPA, RN;Melissa Caiola RDN, LDN;Susanne Bice, RN, BSN, CCRP;Jessica [place], MA, RCEP, CCRP, CCET    Virtual Visit No    Medication changes reported     No    Fall or balance concerns reported    No    Tobacco Cessation No Change    Warm-up and Cool-down Performed on first and last piece of equipment    Resistance Training Performed Yes    VAD Patient? No    PAD/SET Patient? No      Pain Assessment   Currently in Pain? No/denies              Social History   Tobacco Use  Smoking Status Never Smoker  Smokeless Tobacco Never Used    Goals Met:  Independence with exercise equipment Exercise tolerated well No report of cardiac concerns or symptoms Strength training completed today  Goals Unmet:  Not Applicable  Comments: Pt able to follow exercise prescription today without complaint.  Will continue to monitor for progression.    Dr. Emily Filbert is Medical Director for [place] and [name].

## 2020-10-10 ENCOUNTER — Encounter: Payer: Medicare PPO | Admitting: *Deleted

## 2020-10-10 DIAGNOSIS — J849 Interstitial pulmonary disease, unspecified: Secondary | ICD-10-CM | POA: Diagnosis not present

## 2020-10-10 NOTE — Progress Notes (Signed)
Daily Session Note  Patient Details  Name: Joyce Evans MRN: [id number] Date of Birth: 18-Dec-1940 Referring Provider:   Flowsheet Row Pulmonary Rehab from 09/02/2020 in [facility] Cardiac and Pulmonary Rehab  Referring Provider Kloefkorn      Encounter Date: 10/10/2020  Check In:  Session Check In - 10/10/20 1401      Check-In   Supervising physician immediately available to respond to emergencies See telemetry face sheet for immediately available ER MD    Location [facility]-Cardiac & Pulmonary Rehab    Staff Present Renita Papa, RN BSN;Joseph [address] [place], [place], [place], CCRP, CCET    Virtual Visit No    Medication changes reported     No    Fall or balance concerns reported    No    Warm-up and Cool-down Performed on first and last piece of equipment    Resistance Training Performed Yes    VAD Patient? No    PAD/SET Patient? No      Pain Assessment   Currently in Pain? No/denies              Social History   Tobacco Use  Smoking Status Never Smoker  Smokeless Tobacco Never Used    Goals Met:  Independence with exercise equipment Exercise tolerated well No report of cardiac concerns or symptoms Strength training completed today  Goals Unmet:  Not Applicable  Comments: Pt able to follow exercise prescription today without complaint.  Will continue to monitor for progression.    Dr. Emily Filbert is Medical Director for [place] and [name].

## 2020-10-14 ENCOUNTER — Other Ambulatory Visit: Payer: Self-pay

## 2020-10-14 ENCOUNTER — Encounter: Payer: Medicare PPO | Attending: Internal Medicine

## 2020-10-14 DIAGNOSIS — J849 Interstitial pulmonary disease, unspecified: Secondary | ICD-10-CM | POA: Insufficient documentation

## 2020-10-14 NOTE — Progress Notes (Signed)
Daily Session Note  Patient Details  Name: Joyce Evans MRN: [id number] Date of Birth: 12/10/1940 Referring Provider:   Flowsheet Row Pulmonary Rehab from 09/02/2020 in [facility] Cardiac and Pulmonary Rehab  Referring Provider Kloefkorn      Encounter Date: 10/14/2020  Check In:  Session Check In - 10/14/20 1408      Check-In   Supervising physician immediately available to respond to emergencies See telemetry face sheet for immediately available ER MD    Location [facility]-Cardiac & Pulmonary Rehab    Staff Present Birdie Sons, MPA, Mauricia Area, BS, ACSM CEP, Exercise Physiologist;Kara Eliezer Bottom, MS Exercise Physiologist    Virtual Visit No    Medication changes reported     No    Fall or balance concerns reported    No    Tobacco Cessation No Change    Warm-up and Cool-down Performed on first and last piece of equipment    Resistance Training Performed Yes    VAD Patient? No    PAD/SET Patient? No      Pain Assessment   Currently in Pain? No/denies              Social History   Tobacco Use  Smoking Status Never Smoker  Smokeless Tobacco Never Used    Goals Met:  Independence with exercise equipment Exercise tolerated well No report of cardiac concerns or symptoms Strength training completed today  Goals Unmet:  Not Applicable  Comments: Pt able to follow exercise prescription today without complaint.  Will continue to monitor for progression.    Dr. Emily Filbert is Medical Director for [place] and [name].

## 2020-10-16 ENCOUNTER — Other Ambulatory Visit: Payer: Self-pay

## 2020-10-16 DIAGNOSIS — J849 Interstitial pulmonary disease, unspecified: Secondary | ICD-10-CM

## 2020-10-16 NOTE — Progress Notes (Signed)
Daily Session Note  Patient Details  Name: Joyce Evans MRN: [id number] Date of Birth: 1940-11-16 Referring Provider:   Flowsheet Row Pulmonary Rehab from 09/02/2020 in [facility] Cardiac and Pulmonary Rehab  Referring Provider Kloefkorn      Encounter Date: 10/16/2020  Check In:  Session Check In - 10/16/20 1406      Check-In   Supervising physician immediately available to respond to emergencies See telemetry face sheet for immediately available ER MD    Location [facility]-Cardiac & Pulmonary Rehab    Staff Present Birdie Sons, MPA, RN;Meredith Sherryll Burger, RN Margurite Auerbach, MS Exercise Physiologist    Virtual Visit No    Medication changes reported     No    Fall or balance concerns reported    No    Tobacco Cessation No Change    Warm-up and Cool-down Performed on first and last piece of equipment    Resistance Training Performed Yes    VAD Patient? No    PAD/SET Patient? No      Pain Assessment   Currently in Pain? No/denies              Social History   Tobacco Use  Smoking Status Never Smoker  Smokeless Tobacco Never Used    Goals Met:  Independence with exercise equipment Exercise tolerated well No report of cardiac concerns or symptoms Strength training completed today  Goals Unmet:  Not Applicable  Comments: Pt able to follow exercise prescription today without complaint.  Will continue to monitor for progression.    Dr. Emily Filbert is Medical Director for [place] and [name].

## 2020-10-17 ENCOUNTER — Encounter: Payer: Medicare PPO | Admitting: *Deleted

## 2020-10-17 ENCOUNTER — Other Ambulatory Visit: Payer: Self-pay

## 2020-10-17 DIAGNOSIS — J849 Interstitial pulmonary disease, unspecified: Secondary | ICD-10-CM | POA: Diagnosis not present

## 2020-10-17 NOTE — Progress Notes (Signed)
Daily Session Note  Patient Details  Name: Joyce Evans MRN: [id number] Date of Birth: Oct 29, 1940 Referring Provider:   Flowsheet Row Pulmonary Rehab from 09/02/2020 in [facility] Cardiac and Pulmonary Rehab  Referring Provider Kloefkorn      Encounter Date: 10/17/2020  Check In:  Session Check In - 10/17/20 1402      Check-In   Supervising physician immediately available to respond to emergencies See telemetry face sheet for immediately available ER MD    Location [facility]-Cardiac & Pulmonary Rehab    Staff Present Renita Papa, RN BSN;Joseph [address] [place], [place], [place], CCRP, CCET    Virtual Visit No    Medication changes reported     No    Fall or balance concerns reported    No    Warm-up and Cool-down Performed on first and last piece of equipment    Resistance Training Performed Yes    VAD Patient? No    PAD/SET Patient? No      Pain Assessment   Currently in Pain? No/denies              Social History   Tobacco Use  Smoking Status Never Smoker  Smokeless Tobacco Never Used    Goals Met:  Independence with exercise equipment Exercise tolerated well No report of cardiac concerns or symptoms Strength training completed today  Goals Unmet:  Not Applicable  Comments: Pt able to follow exercise prescription today without complaint.  Will continue to monitor for progression.    Dr. Emily Filbert is Medical Director for [place] and [name].

## 2020-10-21 ENCOUNTER — Other Ambulatory Visit: Payer: Self-pay

## 2020-10-21 DIAGNOSIS — J849 Interstitial pulmonary disease, unspecified: Secondary | ICD-10-CM | POA: Diagnosis not present

## 2020-10-21 NOTE — Progress Notes (Signed)
Daily Session Note  Patient Details  Name: Joyce Evans MRN: [id number] Date of Birth: 1940/10/29 Referring Provider:   Flowsheet Row Pulmonary Rehab from 09/02/2020 in [facility]  Referring Provider Kloefkorn      Encounter Date: 10/21/2020  Check In:  Session Check In - 10/21/20 1408      Check-In   Supervising physician immediately available to respond to emergencies See telemetry face sheet for immediately available ER MD    Location [facility]    Staff Present Earlean Shawl, BS, ACSM CEP, Exercise Physiologist;Antavius Sperbeck Rosalia Hammers, MPA, RN;Joseph Lou Miner, MS Exercise Physiologist    Virtual Visit No    Medication changes reported     No    Fall or balance concerns reported    No    Tobacco Cessation No Change    Warm-up and Cool-down Performed on first and last piece of equipment    Resistance Training Performed Yes    VAD Patient? No    PAD/SET Patient? No      Pain Assessment   Currently in Pain? No/denies              Social History   Tobacco Use  Smoking Status Never Smoker  Smokeless Tobacco Never Used    Goals Met:  Independence with exercise equipment Exercise tolerated well No report of cardiac concerns or symptoms Strength training completed today  Goals Unmet:  Not Applicable  Comments: Pt able to follow exercise prescription today without complaint.  Will continue to monitor for progression.    Dr. Emily Filbert is Medical Director for [name].

## 2020-10-23 ENCOUNTER — Other Ambulatory Visit: Payer: Self-pay

## 2020-10-23 DIAGNOSIS — J849 Interstitial pulmonary disease, unspecified: Secondary | ICD-10-CM

## 2020-10-23 NOTE — Progress Notes (Signed)
Daily Session Note  Patient Details  Name: Joyce Evans MRN: [id number] Date of Birth: 04/15/1941 Referring Provider:   Flowsheet Row Pulmonary Rehab from 09/02/2020 in [facility]  Referring Provider Kloefkorn      Encounter Date: 10/23/2020  Check In:  Session Check In - 10/23/20 1417      Check-In   Supervising physician immediately available to respond to emergencies See telemetry face sheet for immediately available ER MD    Location [facility]    Staff Present Kelly Bollinger, MPA, RN;Joseph Hood RCP,RRT,BSRT;Kara Langdon, MS Exercise Physiologist    Virtual Visit No    Medication changes reported     No    Fall or balance concerns reported    No    Tobacco Cessation No Change    Warm-up and Cool-down Performed on first and last piece of equipment    Resistance Training Performed Yes    VAD Patient? No    PAD/SET Patient? No      Pain Assessment   Currently in Pain? No/denies              Social History   Tobacco Use  Smoking Status Never Smoker  Smokeless Tobacco Never Used    Goals Met:  Independence with exercise equipment Exercise tolerated well No report of cardiac concerns or symptoms Strength training completed today  Goals Unmet:  Not Applicable  Comments: Pt able to follow exercise prescription today without complaint.  Will continue to monitor for progression.    Dr. Mark Miller is Medical Director for [facility] and [name]. 

## 2020-10-30 ENCOUNTER — Encounter: Payer: Self-pay | Admitting: *Deleted

## 2020-10-30 DIAGNOSIS — J849 Interstitial pulmonary disease, unspecified: Secondary | ICD-10-CM

## 2020-10-30 NOTE — Progress Notes (Signed)
Pulmonary Individual Treatment Plan  Patient Details  Name: Joyce Evans MRN: [id number] Date of Birth: [birth year] Referring Provider:   Flowsheet Row Pulmonary Rehab from 09/02/2020 in [facility] Cardiac and Pulmonary Rehab  Referring Provider Kloefkorn      Initial Encounter Date:  Flowsheet Row Pulmonary Rehab from 09/02/2020 in [facility] Cardiac and Pulmonary Rehab  Date 09/02/20      Visit Diagnosis: ILD (interstitial lung disease) ([place])  Patient's Home Medications on Admission:  Current Outpatient Medications:  .  acetaminophen (TYLENOL) 500 MG tablet, Take 500 mg by mouth every 6 (six) hours as needed., Disp: , Rfl:  .  amLODipine (NORVASC) 5 MG tablet, Take 5 mg by mouth daily., Disp: , Rfl:  .  apixaban (ELIQUIS) 5 MG TABS tablet, Take 5 mg by mouth 2 (two) times daily. , Disp: , Rfl:  .  atorvastatin (LIPITOR) 40 MG tablet, Take 1 tablet (40 mg total) by mouth daily., Disp: 30 tablet, Rfl: 3 .  atorvastatin (LIPITOR) 40 MG tablet, Take by mouth., Disp: , Rfl:  .  azelastine (ASTELIN) 0.1 % nasal spray, Place 2 sprays into both nostrils 2 (two) times daily. (Patient not taking: Reported on 08/29/2020), Disp: , Rfl:  .  brimonidine (ALPHAGAN P) 0.1 % SOLN, Place 1 drop into the right eye 2 (two) times daily., Disp: , Rfl:  .  Brinzolamide-Brimonidine 1-0.2 % SUSP, Place 1 drop into the left eye 2 (two) times daily., Disp: , Rfl:  .  cetirizine (ZYRTEC) 10 MG tablet, Take 10 mg by mouth daily. (Patient not taking: Reported on 08/29/2020), Disp: , Rfl:  .  Cholecalciferol (VITAMIN D3) 3000 units TABS, Take 3,000-6,000 Units by mouth 2 (two) times daily. , Disp: , Rfl:  .  Cholecalciferol 50 MCG (2000 UT) CAPS, Take by mouth., Disp: , Rfl:  .  fluticasone (FLONASE) 50 MCG/ACT nasal spray, Place 2 sprays into the nose daily., Disp: , Rfl:  .  furosemide (LASIX) 20 MG tablet, Take 20 mg by mouth daily., Disp: , Rfl:  .  furosemide (LASIX) 20 MG tablet, Take by mouth. (Patient not taking:  Reported on 08/29/2020), Disp: , Rfl:  .  losartan (COZAAR) 100 MG tablet, Take 100 mg by mouth daily., Disp: , Rfl:  .  multivitamin-iron-minerals-folic acid (CENTRUM) chewable tablet, Chew 1 tablet by mouth daily., Disp: , Rfl:  .  omeprazole (PRILOSEC) 20 MG capsule, Take 20 mg by mouth 2 (two) times daily. Reported on 08/28/2015 (Patient not taking: Reported on 08/29/2020), Disp: , Rfl:  .  omeprazole (PRILOSEC) 40 MG capsule, Take by mouth., Disp: , Rfl:  .  Probiotic Product (PROBIOTIC DAILY PO), Take by mouth daily., Disp: , Rfl:  .  spironolactone (ALDACTONE) 25 MG tablet, Take 12.5 mg by mouth daily. (Patient not taking: Reported on 08/29/2020), Disp: , Rfl:  .  timolol (BETIMOL) 0.5 % ophthalmic solution, Place 1 drop into both eyes daily., Disp: , Rfl:  .  Zinc Citrate-Phytase (ZYTAZE) 25-500 MG CAPS, Take by mouth., Disp: , Rfl:   Past Medical History: Past Medical History:  Diagnosis Date  . Arthritis    osteo - shoulders, fingers  . Atrial fibrillation ([place])   . Dizziness    thinks because of diuretic  . Family history of adverse reaction to anesthesia    daughter -PONV  . Gallstones 2016, 2017  . GERD (gastroesophageal reflux disease)   . Glaucoma   . Heart murmur    history of  . Hypertension   .  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 Excised: 05/19/2016, margins free.  . Numbness in left leg    s/p hematoma  . Sciatica    right  . Seasonal allergies   . Skin cancer   . 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 Tricuspid valve disorder    leak    Tobacco Use: Social History   Tobacco Use  Smoking Status Never Smoker  Smokeless Tobacco Never Used    Labs: Recent Review Flowsheet Data    Labs for ITP Cardiac and Pulmonary Rehab Latest Ref Rng & Units 06/17/2020 06/18/2020   Cholestrol 0 - 200 mg/dL - 184   LDLCALC 0 - 99 mg/dL - 122(H)   LDLDIRECT 0 - 99 mg/dL 133.5(H) -   HDL  >40 mg/dL - 51   Trlycerides <150 mg/dL - 57   Hemoglobin A1c 4.8 - 5.6 % 5.4 -       Pulmonary Assessment Scores:  Pulmonary Assessment Scores    Row Name 09/02/20 1106         ADL UCSD   SOB Score total 48     Rest 1     Walk 2     Stairs 4     Bath 0     Dress 1     Shop 3           CAT Score   CAT Score 18           mMRC Score   mMRC Score 2            UCSD: Self-administered rating of dyspnea associated with activities of daily living (ADLs) 6-point scale (0 = "not at all" to 5 = "maximal or unable to do because of breathlessness")  Scoring Scores range from 0 to 120.  Minimally important difference is 5 units  CAT: CAT can identify the health impairment of COPD patients and is better correlated with disease progression.  CAT has a scoring range of zero to 40. The CAT score is classified into four groups of low (less than 10), medium (10 - 20), high (21-30) and very high (31-40) based on the impact level of disease on health status. A CAT score over 10 suggests significant symptoms.  A worsening CAT score could be explained by an exacerbation, poor medication adherence, poor inhaler technique, or progression of COPD or comorbid conditions.  CAT MCID is 2 points  mMRC: mMRC (Modified Medical Research Council) Dyspnea Scale is used to assess the degree of baseline functional disability in patients of respiratory disease due to dyspnea. No minimal important difference is established. A decrease in score of 1 point or greater is considered a positive change.   Pulmonary Function Assessment:  Pulmonary Function Assessment - 08/29/20 0913      Breath   Shortness of Breath Yes;Limiting activity           Exercise Target Goals: Exercise Program Goal: Individual exercise prescription set using results from initial 6 min walk test and THRR while considering  patient's activity barriers and safety.   Exercise Prescription Goal: Initial exercise prescription  builds to 30-45 minutes a day of aerobic activity, 2-3 days per week.  Home exercise guidelines will be given to patient during program as part of exercise prescription that the participant will acknowledge.  Education: Aerobic Exercise: - Group verbal and visual presentation on the components of exercise prescription. Introduces  F.I.T.T principle from ACSM for exercise prescriptions.  Reviews F.I.T.T. principles of aerobic exercise including progression. Written material given at graduation.   Education: Resistance Exercise: - Group verbal and visual presentation on the components of exercise prescription. Introduces F.I.T.T principle from ACSM for exercise prescriptions  Reviews F.I.T.T. principles of resistance exercise including progression. Written material given at graduation.    Education: Exercise & Equipment Safety: - Individual verbal instruction and demonstration of equipment use and safety with use of the equipment. Flowsheet Row Pulmonary Rehab from 10/23/2020 in [facility] Cardiac and Pulmonary Rehab  Date 09/02/20  Educator AS  Instruction Review Code 1- Verbalizes Understanding      Education: Exercise Physiology & General Exercise Guidelines: - Group verbal and written instruction with models to review the exercise physiology of the cardiovascular system and associated critical values. Provides general exercise guidelines with specific guidelines to those with heart or lung disease.  Flowsheet Row Pulmonary Rehab from 10/23/2020 in [facility] Cardiac and Pulmonary Rehab  Date 10/23/20  Educator AS  Instruction Review Code 1- Verbalizes Understanding      Education: Flexibility, Balance, Mind/Body Relaxation: - Group verbal and visual presentation with interactive activity on the components of exercise prescription. Introduces F.I.T.T principle from ACSM for exercise prescriptions. Reviews F.I.T.T. principles of flexibility and balance exercise training including progression. Also  discusses the mind body connection.  Reviews various relaxation techniques to help reduce and manage stress (i.e. Deep breathing, progressive muscle relaxation, and visualization). Balance handout provided to take home. Written material given at graduation. Flowsheet Row Pulmonary Rehab from 10/23/2020 in [facility] Cardiac and Pulmonary Rehab  Date 09/11/20  Educator AS  Instruction Review Code 1- Verbalizes Understanding      Activity Barriers & Risk Stratification:   6 Minute Walk:  6 Minute Walk    Row Name 09/02/20 1056         6 Minute Walk   Phase Initial     Distance 950 feet     Walk Time 6 minutes     # of Rest Breaks 0     MPH 1.8     METS 1.95     RPE 12     Perceived Dyspnea  1     VO2 Peak 6.82     Symptoms No     Resting HR 69 bpm     Resting BP 142/68     Resting Oxygen Saturation  95 %     Exercise Oxygen Saturation  during 6 min walk 85 %     Max Ex. HR 111 bpm     Max Ex. BP 164/78     2 Minute Post BP 144/68           Interval HR   1 Minute HR 90     2 Minute HR 81     3 Minute HR 105     4 Minute HR 109     5 Minute HR 111     6 Minute HR 103     2 Minute Post HR 98     Interval Heart Rate? Yes           Interval Oxygen   Interval Oxygen? Yes     Baseline Oxygen Saturation % 95 %     1 Minute Oxygen Saturation % 90 %     1 Minute Liters of Oxygen 4 L  pulsed     2 Minute Oxygen Saturation % 89 %     2 Minute  Liters of Oxygen 4 L  pulsed     3 Minute Oxygen Saturation % 88 %     3 Minute Liters of Oxygen 4 L  pulsed     4 Minute Oxygen Saturation % 87 %     4 Minute Liters of Oxygen 4 L  pulsed     5 Minute Oxygen Saturation % 85 %     5 Minute Liters of Oxygen 4 L  pulsed     6 Minute Oxygen Saturation % 85 %     6 Minute Liters of Oxygen 4 L  pulsed     2 Minute Post Oxygen Saturation % 88 %     2 Minute Post Liters of Oxygen 4 L  pulsed           Oxygen Initial Assessment:  Oxygen Initial Assessment - 08/29/20 0911      Home  Oxygen   Home Oxygen Device Portable Concentrator;Home Concentrator;E-Tanks    Sleep Oxygen Prescription Continuous   PRN   Liters per minute 2   PRN   Home Exercise Oxygen Prescription Continuous    Liters per minute 2   PRN   Home Resting Oxygen Prescription Continuous    Liters per minute 2   PRN   Compliance with Home Oxygen Use Yes      Initial 6 min Walk   Oxygen Used None      Program Oxygen Prescription   Program Oxygen Prescription None      Intervention   Short Term Goals To learn and exhibit compliance with exercise, home and travel O2 prescription;To learn and understand importance of monitoring SPO2 with pulse oximeter and demonstrate accurate use of the pulse oximeter.;To learn and understand importance of maintaining oxygen saturations>88%;To learn and demonstrate proper pursed lip breathing techniques or other breathing techniques.;To learn and demonstrate proper use of respiratory medications    Long  Term Goals Exhibits compliance with exercise, home and travel O2 prescription;Verbalizes importance of monitoring SPO2 with pulse oximeter and return demonstration;Maintenance of O2 saturations>88%;Exhibits proper breathing techniques, such as pursed lip breathing or other method taught during program session;Compliance with respiratory medication;Demonstrates proper use of MDI's           Oxygen Re-Evaluation:  Oxygen Re-Evaluation    Row Name 10/17/20 1411             Program Oxygen Prescription   Program Oxygen Prescription Continuous       Liters per minute 2               Home Oxygen   Home Oxygen Device Home Concentrator;Portable Concentrator;E-Tanks       Sleep Oxygen Prescription Continuous       Liters per minute 2       Home Exercise Oxygen Prescription Continuous       Liters per minute 2       Home Resting Oxygen Prescription Continuous       Liters per minute 2       Compliance with Home Oxygen Use Yes               Goals/Expected Outcomes    Short Term Goals To learn and understand importance of maintaining oxygen saturations>88%       Long  Term Goals Maintenance of O2 saturations>88%       Comments She does  have a pulse oximeter to check her oxygen saturation at home. Informed her where to get one and explained why  it is important to have one. Reviewed that oxygen saturations should be 88 percent and above. Patient has a pulse oximeter at home to check his oxygen.       Goals/Expected Outcomes Short: monitor oxygen at home with exertion. Long: maintain oxygen saturations above 88 percent independently.              Oxygen Discharge (Final Oxygen Re-Evaluation):  Oxygen Re-Evaluation - 10/17/20 1411      Program Oxygen Prescription   Program Oxygen Prescription Continuous    Liters per minute 2      Home Oxygen   Home Oxygen Device Home Concentrator;Portable Concentrator;E-Tanks    Sleep Oxygen Prescription Continuous    Liters per minute 2    Home Exercise Oxygen Prescription Continuous    Liters per minute 2    Home Resting Oxygen Prescription Continuous    Liters per minute 2    Compliance with Home Oxygen Use Yes      Goals/Expected Outcomes   Short Term Goals To learn and understand importance of maintaining oxygen saturations>88%    Long  Term Goals Maintenance of O2 saturations>88%    Comments She does  have a pulse oximeter to check her oxygen saturation at home. Informed her where to get one and explained why it is important to have one. Reviewed that oxygen saturations should be 88 percent and above. Patient has a pulse oximeter at home to check his oxygen.    Goals/Expected Outcomes Short: monitor oxygen at home with exertion. Long: maintain oxygen saturations above 88 percent independently.           Initial Exercise Prescription:  Initial Exercise Prescription - 09/02/20 1100      Date of Initial Exercise RX and Referring Provider   Date 09/02/20    Referring Provider Kloefkorn      Treadmill    MPH 1.5    Grade 0    Minutes 15    METs 2      Recumbant Bike   Level 1    RPM 60    Minutes 15    METs 2      NuStep   Level 2    SPM 80    Minutes 15    METs 2      Recumbant Elliptical   Level 1    RPM 50    Minutes 15    METs 2      REL-XR   Level 2    Watts 50    Minutes 15    METs 2      Prescription Details   Frequency (times per week) 3    Duration Progress to 30 minutes of continuous aerobic without signs/symptoms of physical distress      Intensity   THRR 40-80% of Max Heartrate 98-127    Ratings of Perceived Exertion 11-13      Progression   Progression Continue progressive overload as per policy without signs/symptoms or physical distress.      Resistance Training   Training Prescription Yes    Weight 3 lb    Reps 10-15           Perform Capillary Blood Glucose checks as needed.  Exercise Prescription Changes:  Exercise Prescription Changes    Row Name 09/02/20 1100 09/05/20 1400 09/09/20 1600 09/23/20 1200 10/21/20 1000     Response to Exercise   Blood Pressure (Admit) 142/68 -- 110/64 142/82 124/60   Blood Pressure (Exercise) 164/78 -- 120/70  138/64 142/70   Blood Pressure (Exit) 144/68 -- 122/56 124/72 114/62   Heart Rate (Admit) 69 bpm -- 62 bpm 96 bpm 70 bpm   Heart Rate (Exercise) 111 bpm -- 68 bpm 99 bpm 81 bpm   Heart Rate (Exit) 82 bpm -- 57 bpm 70 bpm 67 bpm   Oxygen Saturation (Admit) -- -- 93 % 92 % 97 %   Oxygen Saturation (Exercise) -- -- 100 % 98 % 91 %   Oxygen Saturation (Exit) -- -- 98 % 95 % 96 %   Rating of Perceived Exertion (Exercise) 12 -- $Rem'13 14 13   'NoIs$ Perceived Dyspnea (Exercise) 1 -- $Re'2 3 2   'ZAa$ Symptoms none -- SOB SOB SOB   Duration -- -- Continue with 30 min of aerobic exercise without signs/symptoms of physical distress. Continue with 30 min of aerobic exercise without signs/symptoms of physical distress. Continue with 30 min of aerobic exercise without signs/symptoms of physical distress.   Intensity -- --  THRR unchanged THRR unchanged THRR unchanged     Progression   Progression -- -- Continue to progress workloads to maintain intensity without signs/symptoms of physical distress. Continue to progress workloads to maintain intensity without signs/symptoms of physical distress. Continue to progress workloads to maintain intensity without signs/symptoms of physical distress.   Average METs -- -- 1.91 2.98 2     Resistance Training   Training Prescription -- -- Yes Yes Yes   Weight -- -- 3 lb 3 lb 3 lb   Reps -- -- 10-15 10-15 10-15     Interval Training   Interval Training -- -- No No No     Treadmill   MPH -- -- 1.5 1.5 --   Grade -- -- 0 0 --   Minutes -- -- 15 15 --   METs -- -- 2.15 2.15 --     NuStep   Level -- -- 3 -- 3   SPM -- -- -- -- 80   Minutes -- -- 15 -- 15   METs -- -- 2 -- 2     Recumbant Elliptical   Level -- -- 1 -- --   Minutes -- -- 15 -- --   METs -- -- 1.5 -- --     REL-XR   Level -- -- -- 2 --   Watts -- -- -- 5 --   Minutes -- -- -- 15 --     Biostep-RELP   Level -- -- 2 -- 2   Minutes -- -- 15 -- 15   METs -- -- 2 -- 2     Home Exercise Plan   Plans to continue exercise at -- Home (comment)  has [employer] (comment)  has Programmer, applications -- --   Frequency -- Add 3 additional days to program exercise sessions. Add 3 additional days to program exercise sessions. -- --   Initial Home Exercises Provided -- 09/05/20 09/05/20 -- --          Exercise Comments:  Exercise Comments    Row Name 09/04/20 1418           Exercise Comments First full day of exercise!  Patient was oriented to gym and equipment including functions, settings, policies, and procedures.  Patient's individual exercise prescription and treatment plan were reviewed.  All starting workloads were established based on the results of the 6 minute walk test done at initial orientation visit.  The plan for exercise progression was also introduced and progression will be  customized based on patient's performance and goals.              Exercise Goals and Review:  Exercise Goals    Row Name 09/02/20 1104             Exercise Goals   Increase Physical Activity Yes       Intervention Provide advice, education, support and counseling about physical activity/exercise needs.;Develop an individualized exercise prescription for aerobic and resistive training based on initial evaluation findings, risk stratification, comorbidities and participant's personal goals.       Expected Outcomes Short Term: Attend rehab on a regular basis to increase amount of physical activity.;Long Term: Add in home exercise to make exercise part of routine and to increase amount of physical activity.;Long Term: Exercising regularly at least 3-5 days a week.       Increase Strength and Stamina Yes       Intervention Provide advice, education, support and counseling about physical activity/exercise needs.;Develop an individualized exercise prescription for aerobic and resistive training based on initial evaluation findings, risk stratification, comorbidities and participant's personal goals.       Expected Outcomes Short Term: Increase workloads from initial exercise prescription for resistance, speed, and METs.;Short Term: Perform resistance training exercises routinely during rehab and add in resistance training at home;Long Term: Improve cardiorespiratory fitness, muscular endurance and strength as measured by increased METs and functional capacity (6MWT)       Able to understand and use rate of perceived exertion (RPE) scale Yes       Intervention Provide education and explanation on how to use RPE scale       Expected Outcomes Short Term: Able to use RPE daily in rehab to express subjective intensity level;Long Term:  Able to use RPE to guide intensity level when exercising independently       Able to understand and use Dyspnea scale Yes       Intervention Provide education and  explanation on how to use Dyspnea scale       Expected Outcomes Short Term: Able to use Dyspnea scale daily in rehab to express subjective sense of shortness of breath during exertion;Long Term: Able to use Dyspnea scale to guide intensity level when exercising independently       Knowledge and understanding of Target Heart Rate Range (THRR) Yes       Intervention Provide education and explanation of THRR including how the numbers were predicted and where they are located for reference       Expected Outcomes Short Term: Able to use daily as guideline for intensity in rehab;Short Term: Able to state/look up THRR;Long Term: Able to use THRR to govern intensity when exercising independently       Able to check pulse independently Yes       Intervention Provide education and demonstration on how to check pulse in carotid and radial arteries.;Review the importance of being able to check your own pulse for safety during independent exercise       Expected Outcomes Short Term: Able to explain why pulse checking is important during independent exercise;Long Term: Able to check pulse independently and accurately       Understanding of Exercise Prescription Yes       Intervention Provide education, explanation, and written materials on patient's individual exercise prescription       Expected Outcomes Short Term: Able to explain program exercise prescription;Long Term: Able to explain home exercise prescription to exercise independently  Exercise Goals Re-Evaluation :  Exercise Goals Re-Evaluation    Row Name 09/04/20 1419 09/05/20 1442 09/09/20 1622 09/23/20 1219 10/08/20 1444     Exercise Goal Re-Evaluation   Exercise Goals Review Increase Physical Activity;Knowledge and understanding of Target Heart Rate Range (THRR);Understanding of Exercise Prescription;Able to understand and use rate of perceived exertion (RPE) scale;Increase Strength and Stamina;Able to understand and use Dyspnea  scale;Able to check pulse independently Increase Physical Activity;Increase Strength and Stamina Increase Physical Activity;Increase Strength and Stamina;Understanding of Exercise Prescription Increase Physical Activity;Increase Strength and Stamina --   Comments Reviewed RPE and dyspnea scales, THR and program prescription with pt today.  Pt voiced understanding and was given a copy of goals to take home. Reviewed home exercise with pt today.  Pt plans to use the [place] machine for exercise.  Reviewed THR, pulse, RPE, sign and symptoms, pulse oximetery and when to call 911 or MD.  Also discussed weather considerations and indoor options.  Pt voiced understanding. Danie is off to a good start in rehab.  She has completed her first three full days of exercise and already exercising on her own at home.  She is getting in her full 30 min already too!  We will continue to monitor her progress. Judys oxygen and BP have been in good range during exercise.  Staff will encourage trying 4 lb weights. Out for 3 week in [place]   Expected Outcomes Short: Use RPE daily to regulate intensity. Long: Follow program prescription in THR. Short: monitor HR and oxygen while exercising at home  Long: improve MET level Short: Continue to attend rehab regularly Long: Continue to improve stamina. Short: move up to 4 lb weights Long:  improve overall stamina --   Row Name 10/17/20 1413 10/21/20 1032           Exercise Goal Re-Evaluation   Exercise Goals Review Increase Strength and Stamina;Increase Physical Activity Increase Physical Activity;Increase Strength and Stamina      Comments Saylor was walking and being fairly active when she was out of town. She is ready to get back to exercsie and ready to improve more. [place]'s oxygen stays in the 90s with exercise.  She does not quite reach THR range.  Staff will review and encourage her to reach THR range.      Expected Outcomes Short:attend [name] routinely. Long: maintain an  exercise routine post [name] Short:  reach THR range durign exercise Long:  improve overall stamina             Discharge Exercise Prescription (Final Exercise Prescription Changes):  Exercise Prescription Changes - 10/21/20 1000      Response to Exercise   Blood Pressure (Admit) 124/60    Blood Pressure (Exercise) 142/70    Blood Pressure (Exit) 114/62    Heart Rate (Admit) 70 bpm    Heart Rate (Exercise) 81 bpm    Heart Rate (Exit) 67 bpm    Oxygen Saturation (Admit) 97 %    Oxygen Saturation (Exercise) 91 %    Oxygen Saturation (Exit) 96 %    Rating of Perceived Exertion (Exercise) 13    Perceived Dyspnea (Exercise) 2    Symptoms SOB    Duration Continue with 30 min of aerobic exercise without signs/symptoms of physical distress.    Intensity THRR unchanged      Progression   Progression Continue to progress workloads to maintain intensity without signs/symptoms of physical distress.    Average METs 2  Resistance Training   Training Prescription Yes    Weight 3 lb    Reps 10-15      Interval Training   Interval Training No      NuStep   Level 3    SPM 80    Minutes 15    METs 2      Biostep-RELP   Level 2    Minutes 15    METs 2           Nutrition:  Target Goals: Understanding of nutrition guidelines, daily intake of sodium '1500mg'$ , cholesterol '200mg'$ , calories 30% from fat and 7% or less from saturated fats, daily to have 5 or more servings of fruits and vegetables.  Education: All About Nutrition: -Group instruction provided by verbal, written material, interactive activities, discussions, models, and posters to present general guidelines for heart healthy nutrition including fat, fiber, MyPlate, the role of sodium in heart healthy nutrition, utilization of the nutrition label, and utilization of this knowledge for meal planning. Follow up email sent as well. Written material given at graduation. Flowsheet Row Cardiac Rehab from 05/30/2018 in [facility]  Cardiac and Pulmonary Rehab  Date 05/16/18  Educator LB  Instruction Review Code 1- Verbalizes Understanding      Biometrics:  Pre Biometrics - 09/02/20 1105      Pre Biometrics   Height 5' 2.75" (1.594 m)    Weight 176 lb 3.2 oz (79.9 kg)    BMI (Calculated) 31.46            Nutrition Therapy Plan and Nutrition Goals:  Nutrition Therapy & Goals - 09/02/20 1020      Nutrition Therapy   Diet Heart healthy, low Na, pulmonary MNT    Drug/Food Interactions Statins/Certain Fruits    Protein (specify units) 80g    Fiber 25 grams    Whole Grain Foods 3 servings    Saturated Fats 12 max. grams    Fruits and Vegetables 8 servings/day    Sodium 1.5 grams      Personal Nutrition Goals   Nutrition Goal ST: Do not add salt to home cooked meals, eat 6 small meals/day LT: limit eating out to 2-3x/week, do not cook with salt, improve breathing with meals.    Comments She has been getting Hello Fresh. She is going on a Cruise soon in March. She believes her problem is portion control. B: egg and cheese wrap - dunkin doughnuts or bran flake cereal with blueberries L: She reports eating out a lot for lunch - taco bell - bean burrito supreme and plain taco, [place] restaurants, red lobster (salmon, brussels sprouts). D: leftovers, hello fresh, or fixes a meal herself - avocado oil, green beans, sea salt, turnip greens, spinach, roasted carrots, brussells sprouts, pork chops, salmon cakes. Drinks: coffee (decaf, caramel macchiado creamer) or green tea and lots of water. Uses benecol instead of butter. She reports liking cheese - havarti, [place], swiss, [place]. She reports eating a lot of peanut butter (natural Jiff). Uses almond milk. She reports eating beans with corn sometimes. She is still strugglin with heartburn. Reviewed heart healthy eating and pulmonary freindly eating. Plan to reduce salt at home, eat smaller -more frequent meals to help with breathing, then reduce frequency of eating out.       Intervention Plan   Intervention Prescribe, educate and counsel regarding individualized specific dietary modifications aiming towards targeted core components such as weight, hypertension, lipid management, diabetes, heart failure and other comorbidities.;Nutrition handout(s) given to patient.  Expected Outcomes Short Term Goal: Understand basic principles of dietary content, such as calories, fat, sodium, cholesterol and nutrients.;Short Term Goal: A plan has been developed with personal nutrition goals set during dietitian appointment.;Long Term Goal: Adherence to prescribed nutrition plan.           Nutrition Assessments:  MEDIFICTS Score Key:  ?70 Need to make dietary changes   40-70 Heart Healthy Diet  ? 40 Therapeutic Level Cholesterol Diet  Flowsheet Row Pulmonary Rehab from 09/02/2020 in [facility] Cardiac and Pulmonary Rehab  Picture Your Plate Total Score on Admission 77     Picture Your Plate Scores:  <16 Unhealthy dietary pattern with much room for improvement.  41-50 Dietary pattern unlikely to meet recommendations for good health and room for improvement.  51-60 More healthful dietary pattern, with some room for improvement.   >60 Healthy dietary pattern, although there may be some specific behaviors that could be improved.   Nutrition Goals Re-Evaluation:  Nutrition Goals Re-Evaluation    [place] Name 10/17/20 1416             Goals   Current Weight 181 lb (82.1 kg)       Nutrition Goal Cut back on sodium       Comment Dawana knows that she needs to cut back on salt. She was out of town on a Cruise for 2 weeks. She feels like her weight is up since she left. She wants to work on getting her weight back down. Informed her to watch her weight and manage her sodium now that she is back from her trip .       Expected Outcome Short: decrease sodium intake. Long: lose more weight and keep a low sodium diet.              Nutrition Goals Discharge (Final Nutrition  Goals Re-Evaluation):  Nutrition Goals Re-Evaluation - 10/17/20 1416      Goals   Current Weight 181 lb (82.1 kg)    Nutrition Goal Cut back on sodium    Comment Allex knows that she needs to cut back on salt. She was out of town on a Cruise for 2 weeks. She feels like her weight is up since she left. She wants to work on getting her weight back down. Informed her to watch her weight and manage her sodium now that she is back from her trip .    Expected Outcome Short: decrease sodium intake. Long: lose more weight and keep a low sodium diet.           Psychosocial: Target Goals: Acknowledge presence or absence of significant depression and/or stress, maximize coping skills, provide positive support system. Participant is able to verbalize types and ability to use techniques and skills needed for reducing stress and depression.   Education: Stress, Anxiety, and Depression - Group verbal and visual presentation to define topics covered.  Reviews how body is impacted by stress, anxiety, and depression.  Also discusses healthy ways to reduce stress and to treat/manage anxiety and depression.  Written material given at graduation. Flowsheet Row Pulmonary Rehab from 10/23/2020 in [facility] Cardiac and Pulmonary Rehab  Date 10/16/20  Educator [facility]  Instruction Review Code 1- [employer] Understanding      Education: Sleep Hygiene -Provides group verbal and written instruction about how sleep can affect your health.  Define sleep hygiene, discuss sleep cycles and impact of sleep habits. Review good sleep hygiene tips.  Flowsheet Row Cardiac Rehab from 05/30/2018 in [facility]  Cardiac and Pulmonary Rehab  Date 03/16/18  Educator [facility]  Instruction Review Code 1- Verbalizes Understanding      Initial Review & Psychosocial Screening:  Initial Psych Review & Screening - 08/29/20 0915      Initial Review   Current issues with Current Psychotropic Meds;Current Anxiety/Panic    Comments She sometimes is anxious  and is a positive person in general.      Family Dynamics   Good Support System? Yes    Comments Marguetta can look to her husband and her two daughters for support.      Barriers   Psychosocial barriers to participate in program The patient should benefit from training in stress management and relaxation.      Screening Interventions   Interventions To provide support and resources with identified psychosocial needs;Encouraged to exercise;Provide feedback about the scores to participant    Expected Outcomes Short Term goal: Utilizing psychosocial counselor, staff and physician to assist with identification of specific Stressors or current issues interfering with healing process. Setting desired goal for each stressor or current issue identified.;Long Term Goal: Stressors or current issues are controlled or eliminated.;Short Term goal: Identification and review with participant of any Quality of Life or Depression concerns found by scoring the questionnaire.;Long Term goal: The participant improves quality of Life and PHQ9 Scores as seen by post scores and/or verbalization of changes           Quality of Life Scores:  Scores of 19 and below usually indicate a poorer quality of life in these areas.  A difference of  2-3 points is a clinically meaningful difference.  A difference of 2-3 points in the total score of the Quality of Life Index has been associated with significant improvement in overall quality of life, self-image, physical symptoms, and general health in studies assessing change in quality of life.  PHQ-9: Recent Review Flowsheet Data    Depression screen [facility] 2/9 09/02/2020 05/30/2018 03/08/2018   Decreased Interest 0 0 1   Down, Depressed, Hopeless 0 0 0   PHQ - 2 Score 0 0 1   Altered sleeping - - 0   Tired, decreased energy $RemoveBeforeDE'1 1 1   'wwCFFqFAlYeVqOB$ Change in appetite 1 0 0   Feeling bad or failure about yourself  0 0 0   Trouble concentrating 0 0 0   Moving slowly or fidgety/restless 0 0 0    Suicidal thoughts 0 0 0   PHQ-9 Score - - 2   Difficult doing work/chores Not difficult at all - Not difficult at all     Interpretation of Total Score  Total Score Depression Severity:  1-4 = Minimal depression, 5-9 = Mild depression, 10-14 = Moderate depression, 15-19 = Moderately severe depression, 20-27 = Severe depression   Psychosocial Evaluation and Intervention:  Psychosocial Evaluation - 08/29/20 0917      Psychosocial Evaluation & Interventions   Interventions Encouraged to exercise with the program and follow exercise prescription;Relaxation education;Stress management education    Comments She sometimes is anxious and is a positive person in general.[place]oe can look to her husband and her two daughters for support.    Expected Outcomes Short: Exercise regularly to support mental health and notify staff of any changes. Long: maintain mental health and well being through teaching of rehab or prescribed medications independently.    Continue Psychosocial Services  Follow up required by staff           Psychosocial Re-Evaluation:  Psychosocial Re-Evaluation  [place] Name 10/17/20 1423             Psychosocial Re-Evaluation   Current issues with None Identified       Comments Patient reports no issues with their current mental states, sleep, stress, depression or anxiety. Will follow up with patient in a few weeks for any changes.       Expected Outcomes Short: Continue to exercise regularly to support mental health and notify staff of any changes. Long: maintain mental health and well being through teaching of rehab or prescribed medications independently.       Interventions Encouraged to attend Pulmonary Rehabilitation for the exercise       Continue Psychosocial Services  Follow up required by staff              Psychosocial Discharge (Final Psychosocial Re-Evaluation):  Psychosocial Re-Evaluation - 10/17/20 1423      Psychosocial Re-Evaluation   Current issues with  None Identified    Comments Patient reports no issues with their current mental states, sleep, stress, depression or anxiety. Will follow up with patient in a few weeks for any changes.    Expected Outcomes Short: Continue to exercise regularly to support mental health and notify staff of any changes. Long: maintain mental health and well being through teaching of rehab or prescribed medications independently.    Interventions Encouraged to attend Pulmonary Rehabilitation for the exercise    Continue Psychosocial Services  Follow up required by staff           Education: Education Goals: Education classes will be provided on a weekly basis, covering required topics. Participant will state understanding/return demonstration of topics presented.  Learning Barriers/Preferences:  Learning Barriers/Preferences - 08/29/20 0910      Learning Barriers/Preferences   Learning Barriers None    Learning Preferences None           General Pulmonary Education Topics:  Infection Prevention: - Provides verbal and written material to individual with discussion of infection control including proper hand washing and proper equipment cleaning during exercise session. Flowsheet Row Pulmonary Rehab from 10/23/2020 in [facility] Cardiac and Pulmonary Rehab  Date 09/02/20  Educator As  Instruction Review Code 1- Verbalizes Understanding      Falls Prevention: - Provides verbal and written material to individual with discussion of falls prevention and safety. Flowsheet Row Pulmonary Rehab from 08/29/2020 in [facility] Cardiac and Pulmonary Rehab  Date 08/29/20  Educator [facility]  Instruction Review Code 1- Verbalizes Understanding      Chronic Lung Disease Review: - Group verbal instruction with posters, models, PowerPoint presentations and videos,  to review new updates, new respiratory medications, new advancements in procedures and treatments. Providing information on websites and "800" numbers for continued  self-education. Includes information about supplement oxygen, available portable oxygen systems, continuous and intermittent flow rates, oxygen safety, concentrators, and Medicare reimbursement for oxygen. Explanation of Pulmonary Drugs, including class, frequency, complications, importance of spacers, rinsing mouth after steroid MDI's, and proper cleaning methods for nebulizers. Review of basic lung anatomy and physiology related to function, structure, and complications of lung disease. Review of risk factors. Discussion about methods for diagnosing sleep apnea and types of masks and machines for OSA. Includes a review of the use of types of environmental controls: home humidity, furnaces, filters, dust mite/pet prevention, HEPA vacuums. Discussion about weather changes, air quality and the benefits of nasal washing. Instruction on Warning signs, infection symptoms, calling MD promptly, preventive modes, and value of vaccinations. Review of  effective airway clearance, coughing and/or vibration techniques. Emphasizing that all should Create an Action Plan. Written material given at graduation. Flowsheet Row Pulmonary Rehab from 10/23/2020 in [facility] Cardiac and Pulmonary Rehab  Date 10/09/20  Educator [facility]  Instruction Review Code 1- Verbalizes Understanding      AED/CPR: - Group verbal and written instruction with the use of models to demonstrate the basic use of the AED with the basic ABC's of resuscitation.    Anatomy and Cardiac Procedures: - Group verbal and visual presentation and models provide information about basic cardiac anatomy and function. Reviews the testing methods done to diagnose heart disease and the outcomes of the test results. Describes the treatment choices: Medical Management, Angioplasty, or Coronary Bypass Surgery for treating various heart conditions including Myocardial Infarction, Angina, Valve Disease, and Cardiac Arrhythmias.  Written material given at graduation. Flowsheet  Row Cardiac Rehab from 05/30/2018 in [facility] Cardiac and Pulmonary Rehab  Date 04/25/18  Educator [place]  Instruction Review Code 1- Verbalizes Understanding      Medication Safety: - Group verbal and visual instruction to review commonly prescribed medications for heart and lung disease. Reviews the medication, class of the drug, and side effects. Includes the steps to properly store meds and maintain the prescription regimen.  Written material given at graduation.   Other: -Provides group and verbal instruction on various topics (see comments)   Knowledge Questionnaire Score:  Knowledge Questionnaire Score - 09/02/20 1109      Knowledge Questionnaire Score   Pre Score 16/18 inhaler            Core Components/Risk Factors/Patient Goals at Admission:  Personal Goals and Risk Factors at Admission - 09/02/20 1114      Core Components/Risk Factors/Patient Goals on Admission    Weight Management Yes;Weight Loss    Intervention Weight Management: Develop a combined nutrition and exercise program designed to reach desired caloric intake, while maintaining appropriate intake of nutrient and fiber, sodium and fats, and appropriate energy expenditure required for the weight goal.;Weight Management: Provide education and appropriate resources to help participant work on and attain dietary goals.;Weight Management/Obesity: Establish reasonable short term and long term weight goals.    Admit Weight 176 lb 3.2 oz (79.9 kg)    Goal Weight: Short Term 175 lb (79.4 kg)    Goal Weight: Long Term 170 lb (77.1 kg)    Expected Outcomes Short Term: Continue to assess and modify interventions until short term weight is achieved;Long Term: Adherence to nutrition and physical activity/exercise program aimed toward attainment of established weight goal;Weight Loss: Understanding of general recommendations for a balanced deficit meal plan, which promotes 1-2 lb weight loss per week and includes a negative energy  balance of [phone] kcal/d;Understanding recommendations for meals to include 15-35% energy as protein, 25-35% energy from fat, 35-60% energy from carbohydrates, less than $RemoveB'200mg'dmEzpgPH$  of dietary cholesterol, 20-35 gm of total fiber daily;Understanding of distribution of calorie intake throughout the day with the consumption of 4-5 meals/snacks    Improve shortness of breath with ADL's Yes    Intervention Provide education, individualized exercise plan and daily activity instruction to help decrease symptoms of SOB with activities of daily living.    Expected Outcomes Short Term: Improve cardiorespiratory fitness to achieve a reduction of symptoms when performing ADLs;Long Term: Be able to perform more ADLs without symptoms or delay the onset of symptoms    Heart Failure Yes    Intervention Provide a combined exercise and nutrition program that is supplemented with  education, support and counseling about heart failure. Directed toward relieving symptoms such as shortness of breath, decreased exercise tolerance, and extremity edema.    Expected Outcomes Improve functional capacity of life;Short term: Attendance in program 2-3 days a week with increased exercise capacity. Reported lower sodium intake. Reported increased fruit and vegetable intake. Reports medication compliance.;Short term: Daily weights obtained and reported for increase. Utilizing diuretic protocols set by physician.;Long term: Adoption of self-care skills and reduction of barriers for early signs and symptoms recognition and intervention leading to self-care maintenance.    Hypertension Yes    Intervention Provide education on lifestyle modifcations including regular physical activity/exercise, weight management, moderate sodium restriction and increased consumption of fresh fruit, vegetables, and low fat dairy, alcohol moderation, and smoking cessation.;Monitor prescription use compliance.    Expected Outcomes Short Term: Continued assessment and  intervention until BP is < 140/18mm HG in hypertensive participants. < 130/4mm HG in hypertensive participants with diabetes, heart failure or chronic kidney disease.;Long Term: Maintenance of blood pressure at goal levels.    Lipids Yes    Intervention Provide education and support for participant on nutrition & aerobic/resistive exercise along with prescribed medications to achieve LDL '70mg'$ , HDL >$Remo'40mg'JrwMY$ .    Expected Outcomes Short Term: Participant states understanding of desired cholesterol values and is compliant with medications prescribed. Participant is following exercise prescription and nutrition guidelines.;Long Term: Cholesterol controlled with medications as prescribed, with individualized exercise RX and with personalized nutrition plan. Value goals: LDL < $Rem'70mg'YBGK$ , HDL > 40 mg.           Education:Diabetes - Individual verbal and written instruction to review signs/symptoms of diabetes, desired ranges of glucose level fasting, after meals and with exercise. Acknowledge that pre and post exercise glucose checks will be done for 3 sessions at entry of program.   Know Your Numbers and Heart Failure: - Group verbal and visual instruction to discuss disease risk factors for cardiac and pulmonary disease and treatment options.  Reviews associated critical values for Overweight/Obesity, Hypertension, Cholesterol, and Diabetes.  Discusses basics of heart failure: signs/symptoms and treatments.  Introduces Heart Failure Zone chart for action plan for heart failure.  Written material given at graduation.   Core Components/Risk Factors/Patient Goals Review:   Goals and Risk Factor Review    Row Name 10/17/20 1414             Core Components/Risk Factors/Patient Goals Review   Personal Goals Review Improve shortness of breath with ADL's       Review Spoke to patient about their shortness of breath and what they can do to improve. Patient has been informed of breathing techniques when  starting the program. Patient is informed to tell staff if they have had any med changes and that certain meds they are taking or not taking can be causing shortness of breath.       Expected Outcomes Short: Attend [name] regularly to improve shortness of breath with ADL's. Long: maintain independence with ADL's              Core Components/Risk Factors/Patient Goals at Discharge (Final Review):   Goals and Risk Factor Review - 10/17/20 1414      Core Components/Risk Factors/Patient Goals Review   Personal Goals Review Improve shortness of breath with ADL's    Review Spoke to patient about their shortness of breath and what they can do to improve. Patient has been informed of breathing techniques when starting the program. Patient is informed to tell staff  if they have had any med changes and that certain meds they are taking or not taking can be causing shortness of breath.    Expected Outcomes Short: Attend [name] regularly to improve shortness of breath with ADL's. Long: maintain independence with ADL's           ITP Comments:  ITP Comments    Row Name 08/29/20 0910 09/02/20 1119 09/04/20 0633 09/04/20 1418 10/02/20 0742   ITP Comments Virtual Visit completed. Patient informed on EP and RD appointment and 6 Minute walk test. Patient also informed of patient health questionnaires on My Chart. Patient Verbalizes understanding. Visit diagnosis can be found in [facility]2/09/2020. Completed 6MWT and gym orientation. Initial ITP created and sent for review to Dr. Emily Filbert, Medical Director. 30 Day review completed. Medical Director ITP review done, changes made as directed, and signed approval by Medical Director.  New to program First full day of exercise!  Patient was oriented to gym and equipment including functions, settings, policies, and procedures.  Patient's individual exercise prescription and treatment plan were reviewed.  All starting workloads were established based on the results of  the 6 minute walk test done at initial orientation visit.  The plan for exercise progression was also introduced and progression will be customized based on patient's performance and goals. 30 Day review completed. Medical Director ITP review done, changes made as directed, and signed approval by Medical Director.   [place] Name 10/08/20 1444 10/30/20 0956         ITP Comments Out for 3 weeks in [place] 30 Day review completed. Medical Director ITP review done, changes made as directed, and signed approval by Medical Director.             Comments:

## 2020-11-04 ENCOUNTER — Other Ambulatory Visit: Payer: Self-pay

## 2020-11-04 DIAGNOSIS — J849 Interstitial pulmonary disease, unspecified: Secondary | ICD-10-CM | POA: Diagnosis not present

## 2020-11-04 NOTE — Progress Notes (Signed)
Daily Session Note  Patient Details  Name: Joyce Evans MRN: [id number] Date of Birth: 02/15/1941 Referring Provider:   Flowsheet Row Pulmonary Rehab from 09/02/2020 in [facility] Cardiac and Pulmonary Rehab  Referring Provider Kloefkorn      Encounter Date: 11/04/2020  Check In:  Session Check In - 11/04/20 1407      Check-In   Supervising physician immediately available to respond to emergencies See telemetry face sheet for immediately available ER MD    Location [facility]-Cardiac & Pulmonary Rehab    Staff Present Birdie Sons, MPA, Mauricia Area, BS, ACSM CEP, Exercise Physiologist;Kara Eliezer Bottom, MS Exercise Physiologist    Virtual Visit No    Medication changes reported     No    Fall or balance concerns reported    No    Tobacco Cessation No Change    Warm-up and Cool-down Performed on first and last piece of equipment    Resistance Training Performed Yes    VAD Patient? No    PAD/SET Patient? No      Pain Assessment   Currently in Pain? No/denies              Social History   Tobacco Use  Smoking Status Never Smoker  Smokeless Tobacco Never Used    Goals Met:  Independence with exercise equipment Exercise tolerated well No report of cardiac concerns or symptoms Strength training completed today  Goals Unmet:  Not Applicable  Comments: Pt able to follow exercise prescription today without complaint.  Will continue to monitor for progression.    Dr. Emily Filbert is Medical Director for [place] and [name].

## 2020-11-06 ENCOUNTER — Other Ambulatory Visit: Payer: Self-pay

## 2020-11-06 DIAGNOSIS — J849 Interstitial pulmonary disease, unspecified: Secondary | ICD-10-CM | POA: Diagnosis not present

## 2020-11-06 NOTE — Progress Notes (Signed)
Daily Session Note  Patient Details  Name: DOT SPLINTER MRN: [id number] Date of Birth: 10-29-1940 Referring Provider:   Flowsheet Row Pulmonary Rehab from 09/02/2020 in [facility] Cardiac and Pulmonary Rehab  Referring Provider Kloefkorn      Encounter Date: 11/06/2020  Check In:  Session Check In - 11/06/20 1418      Check-In   Supervising physician immediately available to respond to emergencies See telemetry face sheet for immediately available ER MD    Location [facility]-Cardiac & Pulmonary Rehab    Staff Present Birdie Sons, MPA, RN;Joseph Lou Min[place], [place] Exercise Physiologist    Virtual Visit No    Medication changes reported     No    Fall or balance concerns reported    No    Tobacco Cessation No Change    Warm-up and Cool-down Performed on first and last piece of equipment    Resistance Training Performed Yes    VAD Patient? No    PAD/SET Patient? No      Pain Assessment   Currently in Pain? No/denies              Social History   Tobacco Use  Smoking Status Never Smoker  Smokeless Tobacco Never Used    Goals Met:  Independence with exercise equipment Exercise tolerated well No report of cardiac concerns or symptoms Strength training completed today  Goals Unmet:  Not Applicable  Comments: Pt able to follow exercise prescription today without complaint.  Will continue to monitor for progression.    Dr. Emily Filbert is Medical Director for [place] and [name].

## 2020-11-07 ENCOUNTER — Encounter: Payer: Medicare PPO | Admitting: *Deleted

## 2020-11-07 ENCOUNTER — Other Ambulatory Visit: Payer: Self-pay

## 2020-11-07 DIAGNOSIS — J849 Interstitial pulmonary disease, unspecified: Secondary | ICD-10-CM | POA: Diagnosis not present

## 2020-11-07 NOTE — Progress Notes (Signed)
Daily Session Note  Patient Details  Name: Joyce Evans MRN: [id number] Date of Birth: 1940-09-09 Referring Provider:   Flowsheet Row Pulmonary Rehab from 09/02/2020 in [facility] Cardiac and Pulmonary Rehab  Referring Provider Kloefkorn      Encounter Date: 11/07/2020  Check In:  Session Check In - 11/07/20 1410      Check-In   Supervising physician immediately available to respond to emergencies See telemetry face sheet for immediately available ER MD    Location [facility]-Cardiac & Pulmonary Rehab    Staff Present Justin Mend RCP,RRT,BSRT;Giovani Neumeister Sherryll Burger, RN BSN;Jessica Luan [place], MA, RCEP, CCRP, CCET    Virtual Visit No    Medication changes reported     No    Fall or balance concerns reported    No    Warm-up and Cool-down Performed on first and last piece of equipment    Resistance Training Performed Yes    VAD Patient? No    PAD/SET Patient? No      Pain Assessment   Currently in Pain? No/denies              Social History   Tobacco Use  Smoking Status Never Smoker  Smokeless Tobacco Never Used    Goals Met:  Independence with exercise equipment Exercise tolerated well No report of cardiac concerns or symptoms Strength training completed today  Goals Unmet:  Not Applicable  Comments: Pt able to follow exercise prescription today without complaint.  Will continue to monitor for progression.    Dr. Emily Filbert is Medical Director for [name] and [name].

## 2020-11-11 ENCOUNTER — Encounter: Payer: Medicare PPO | Attending: Internal Medicine

## 2020-11-11 ENCOUNTER — Other Ambulatory Visit: Payer: Self-pay

## 2020-11-11 DIAGNOSIS — J849 Interstitial pulmonary disease, unspecified: Secondary | ICD-10-CM | POA: Insufficient documentation

## 2020-11-11 NOTE — Progress Notes (Signed)
Daily Session Note  Patient Details  Name: Joyce Evans MRN: [id number] Date of Birth: 1940-12-11 Referring Provider:   Flowsheet Row Pulmonary Rehab from 09/02/2020 in [facility] Cardiac and Pulmonary Rehab  Referring Provider Kloefkorn      Encounter Date: 11/11/2020  Check In:  Session Check In - 11/11/20 1549      Check-In   Supervising physician immediately available to respond to emergencies See telemetry face sheet for immediately available ER MD    Location [facility]-Cardiac & Pulmonary Rehab    Staff Present Birdie Sons, MPA, Mauricia Area, BS, ACSM CEP, Exercise Physiologist;Kara Eliezer Bottom, MS Exercise Physiologist    Virtual Visit No    Medication changes reported     No    Fall or balance concerns reported    No    Tobacco Cessation No Change    Warm-up and Cool-down Performed on first and last piece of equipment    Resistance Training Performed Yes    VAD Patient? No    PAD/SET Patient? No      Pain Assessment   Currently in Pain? No/denies              Social History   Tobacco Use  Smoking Status Never Smoker  Smokeless Tobacco Never Used    Goals Met:  Independence with exercise equipment Exercise tolerated well No report of cardiac concerns or symptoms Strength training completed today  Goals Unmet:  Not Applicable  Comments: Pt able to follow exercise prescription today without complaint.  Will continue to monitor for progression.    Dr. Emily Filbert is Medical Director for [place] and [name].

## 2020-11-13 ENCOUNTER — Encounter: Payer: Medicare PPO | Admitting: Dermatology

## 2020-11-14 ENCOUNTER — Encounter: Payer: Medicare PPO | Admitting: *Deleted

## 2020-11-14 ENCOUNTER — Other Ambulatory Visit: Payer: Self-pay

## 2020-11-14 DIAGNOSIS — J849 Interstitial pulmonary disease, unspecified: Secondary | ICD-10-CM

## 2020-11-14 NOTE — Progress Notes (Signed)
Daily Session Note  Patient Details  Name: FAWNE HUGHLEY MRN: [id number] Date of Birth: [birth year] Referring Provider:   Flowsheet Row Pulmonary Rehab from 09/02/2020 in [facility] Cardiac and Pulmonary Rehab  Referring Provider Kloefkorn      Encounter Date: 11/14/2020  Check In:  Session Check In - 11/14/20 1406      Check-In   Supervising physician immediately available to respond to emergencies See telemetry face sheet for immediately available ER MD    Location [facility]-Cardiac & Pulmonary Rehab    Staff Present Renita Papa, RN BSN;Joseph [address] [place], [place], [place], CCRP, CCET    Virtual Visit No    Medication changes reported     No    Fall or balance concerns reported    No    Warm-up and Cool-down Performed on first and last piece of equipment    Resistance Training Performed Yes    VAD Patient? No    PAD/SET Patient? No      Pain Assessment   Currently in Pain? No/denies              Social History   Tobacco Use  Smoking Status Never Smoker  Smokeless Tobacco Never Used    Goals Met:  Independence with exercise equipment Exercise tolerated well No report of cardiac concerns or symptoms Strength training completed today  Goals Unmet:  Not Applicable  Comments: Pt able to follow exercise prescription today without complaint.  Will continue to monitor for progression.    Dr. Emily Filbert is Medical Director for [place] and [name].

## 2020-11-18 ENCOUNTER — Other Ambulatory Visit: Payer: Self-pay

## 2020-11-18 DIAGNOSIS — J849 Interstitial pulmonary disease, unspecified: Secondary | ICD-10-CM

## 2020-11-18 NOTE — Progress Notes (Signed)
Daily Session Note  Patient Details  Name: Joyce Evans MRN: [id number] Date of Birth: 13-Aug-1940 Referring Provider:   Flowsheet Row Pulmonary Rehab from 09/02/2020 in [facility] Cardiac and Pulmonary Rehab  Referring Provider Kloefkorn      Encounter Date: 11/18/2020  Check In:  Session Check In - 11/18/20 1417      Check-In   Supervising physician immediately available to respond to emergencies See telemetry face sheet for immediately available ER MD    Location [facility]-Cardiac & Pulmonary Rehab    Staff Present Birdie Sons, MPA, [place]icia Area, BS, ACSM CEP, Exercise Physiologist;Kara Eliezer Bottom, MS Exercise Physiologist    Virtual Visit No    Medication changes reported     No    Fall or balance concerns reported    No    Tobacco Cessation No Change    Warm-up and Cool-down Performed on first and last piece of equipment    Resistance Training Performed Yes    VAD Patient? No    PAD/SET Patient? No      Pain Assessment   Currently in Pain? No/denies              Social History   Tobacco Use  Smoking Status Never Smoker  Smokeless Tobacco Never Used    Goals Met:  Independence with exercise equipment Exercise tolerated well No report of cardiac concerns or symptoms Strength training completed today  Goals Unmet:  Not Applicable  Comments: Pt able to follow exercise prescription today without complaint.  Will continue to monitor for progression.    Dr. Emily Filbert is Medical Director for [place] and [name].

## 2020-11-20 ENCOUNTER — Other Ambulatory Visit: Payer: Self-pay

## 2020-11-20 DIAGNOSIS — J849 Interstitial pulmonary disease, unspecified: Secondary | ICD-10-CM | POA: Diagnosis not present

## 2020-11-20 NOTE — Progress Notes (Signed)
Daily Session Note  Patient Details  Name: KLYN KROENING MRN: [id number] Date of Birth: 11-26-1940 Referring Provider:   Flowsheet Row Pulmonary Rehab from 09/02/2020 in [facility] Cardiac and Pulmonary Rehab  Referring Provider Kloefkorn      Encounter Date: 11/20/2020  Check In:  Session Check In - 11/20/20 1403      Check-In   Supervising physician immediately available to respond to emergencies See telemetry face sheet for immediately available ER MD    Location [facility]-Cardiac & Pulmonary Rehab    Staff Present Birdie Sons, MPA, RN;Joseph Lou Miner, [place] Exercise Physiologist    Virtual Visit No    Medication changes reported     No    Fall or balance concerns reported    No    Tobacco Cessation No Change    Warm-up and Cool-down Performed on first and last piece of equipment    Resistance Training Performed Yes    VAD Patient? No    PAD/SET Patient? No      Pain Assessment   Currently in Pain? No/denies              Social History   Tobacco Use  Smoking Status Never Smoker  Smokeless Tobacco Never Used    Goals Met:  Independence with exercise equipment Exercise tolerated well No report of cardiac concerns or symptoms Strength training completed today  Goals Unmet:  Not Applicable  Comments: Pt able to follow exercise prescription today without complaint.  Will continue to monitor for progression.    Dr. Emily Filbert is Medical Director for [place] and [name].

## 2020-11-21 ENCOUNTER — Encounter: Payer: Medicare PPO | Admitting: *Deleted

## 2020-11-21 DIAGNOSIS — J849 Interstitial pulmonary disease, unspecified: Secondary | ICD-10-CM

## 2020-11-21 NOTE — Progress Notes (Signed)
Daily Session Note  Patient Details  Name: Joyce Evans MRN: [id number] Date of Birth: 12/01/1940 Referring Provider:   Flowsheet Row Pulmonary Rehab from 09/02/2020 in [facility] Cardiac and Pulmonary Rehab  Referring Provider Kloefkorn      Encounter Date: 11/21/2020  Check In:  Session Check In - 11/21/20 1410      Check-In   Supervising physician immediately available to respond to emergencies See telemetry face sheet for immediately available ER MD    Location [facility]-Cardiac & Pulmonary Rehab    Staff Present Heath Lark, RN, BSN, CCRP;Jessica [place], MA, RCEP, CCRP, CCET;Joseph Bullard RCP,RRT,BSRT    Virtual Visit No    Medication changes reported     No    Fall or balance concerns reported    No    Warm-up and Cool-down Performed on first and last piece of equipment    Resistance Training Performed Yes    VAD Patient? No    PAD/SET Patient? No      Pain Assessment   Currently in Pain? No/denies              Social History   Tobacco Use  Smoking Status Never Smoker  Smokeless Tobacco Never Used    Goals Met:  Proper associated with RPD/PD & O2 Sat Independence with exercise equipment Exercise tolerated well No report of cardiac concerns or symptoms  Goals Unmet:  Not Applicable  Comments: Pt able to follow exercise prescription today without complaint.  Will continue to monitor for progression.    Dr. Emily Filbert is Medical Director for [place] and [name].

## 2020-11-25 ENCOUNTER — Other Ambulatory Visit: Payer: Self-pay

## 2020-11-25 DIAGNOSIS — J849 Interstitial pulmonary disease, unspecified: Secondary | ICD-10-CM

## 2020-11-25 NOTE — Progress Notes (Signed)
Daily Session Note  Patient Details  Name: CHRISY HILLEBRAND MRN: [id number] Date of Birth: 21-Dec-1940 Referring Provider:   Flowsheet Row Pulmonary Rehab from 09/02/2020 in [facility] Cardiac and Pulmonary Rehab  Referring Provider Kloefkorn      Encounter Date: 11/25/2020  Check In:  Session Check In - 11/25/20 1417      Check-In   Supervising physician immediately available to respond to emergencies See telemetry face sheet for immediately available ER MD    Location [facility]-Cardiac & Pulmonary Rehab    Staff Present Birdie Sons, MPA, Mauricia Area, BS, ACSM CEP, Exercise Physiologist;Jessica [place], MA, RCEP, CCRP, CCET;Laureen [place], BS, RRT, CPFT    Virtual Visit No    Medication changes reported     No    Fall or balance concerns reported    No    Tobacco Cessation No Change    Warm-up and Cool-down Performed on first and last piece of equipment    Resistance Training Performed Yes    VAD Patient? No    PAD/SET Patient? No      Pain Assessment   Currently in Pain? No/denies              Social History   Tobacco Use  Smoking Status Never Smoker  Smokeless Tobacco Never Used    Goals Met:  Independence with exercise equipment Exercise tolerated well No report of cardiac concerns or symptoms Strength training completed today  Goals Unmet:  Not Applicable  Comments: Pt able to follow exercise prescription today without complaint.  Will continue to monitor for progression.    Dr. Emily Filbert is Medical Director for [place] and [name].

## 2020-11-27 ENCOUNTER — Encounter: Payer: Self-pay | Admitting: *Deleted

## 2020-11-27 DIAGNOSIS — J849 Interstitial pulmonary disease, unspecified: Secondary | ICD-10-CM

## 2020-11-27 NOTE — Progress Notes (Signed)
Pulmonary Individual Treatment Plan  Patient Details  Name: Joyce Evans MRN: [id number] Date of Birth: [birth year] Referring Provider:   Flowsheet Row Pulmonary Rehab from 09/02/2020 in [facility] Cardiac and Pulmonary Rehab  Referring Provider Kloefkorn      Initial Encounter Date:  Flowsheet Row Pulmonary Rehab from 09/02/2020 in [facility] Cardiac and Pulmonary Rehab  Date 09/02/20      Visit Diagnosis: ILD (interstitial lung disease) ([place])  Patient's Home Medications on Admission:  Current Outpatient Medications:  .  acetaminophen (TYLENOL) 500 MG tablet, Take 500 mg by mouth every 6 (six) hours as needed., Disp: , Rfl:  .  amLODipine (NORVASC) 5 MG tablet, Take 5 mg by mouth daily., Disp: , Rfl:  .  apixaban (ELIQUIS) 5 MG TABS tablet, Take 5 mg by mouth 2 (two) times daily. , Disp: , Rfl:  .  atorvastatin (LIPITOR) 40 MG tablet, Take 1 tablet (40 mg total) by mouth daily., Disp: 30 tablet, Rfl: 3 .  atorvastatin (LIPITOR) 40 MG tablet, Take by mouth., Disp: , Rfl:  .  azelastine (ASTELIN) 0.1 % nasal spray, Place 2 sprays into both nostrils 2 (two) times daily. (Patient not taking: Reported on 08/29/2020), Disp: , Rfl:  .  brimonidine (ALPHAGAN P) 0.1 % SOLN, Place 1 drop into the right eye 2 (two) times daily., Disp: , Rfl:  .  Brinzolamide-Brimonidine 1-0.2 % SUSP, Place 1 drop into the left eye 2 (two) times daily., Disp: , Rfl:  .  cetirizine (ZYRTEC) 10 MG tablet, Take 10 mg by mouth daily. (Patient not taking: Reported on 08/29/2020), Disp: , Rfl:  .  Cholecalciferol (VITAMIN D3) 3000 units TABS, Take 3,000-6,000 Units by mouth 2 (two) times daily. , Disp: , Rfl:  .  Cholecalciferol 50 MCG (2000 UT) CAPS, Take by mouth., Disp: , Rfl:  .  fluticasone (FLONASE) 50 MCG/ACT nasal spray, Place 2 sprays into the nose daily., Disp: , Rfl:  .  furosemide (LASIX) 20 MG tablet, Take 20 mg by mouth daily., Disp: , Rfl:  .  furosemide (LASIX) 20 MG tablet, Take by mouth. (Patient not taking:  Reported on 08/29/2020), Disp: , Rfl:  .  losartan (COZAAR) 100 MG tablet, Take 100 mg by mouth daily., Disp: , Rfl:  .  multivitamin-iron-minerals-folic acid (CENTRUM) chewable tablet, Chew 1 tablet by mouth daily., Disp: , Rfl:  .  omeprazole (PRILOSEC) 20 MG capsule, Take 20 mg by mouth 2 (two) times daily. Reported on 08/28/2015 (Patient not taking: Reported on 08/29/2020), Disp: , Rfl:  .  omeprazole (PRILOSEC) 40 MG capsule, Take by mouth., Disp: , Rfl:  .  Probiotic Product (PROBIOTIC DAILY PO), Take by mouth daily., Disp: , Rfl:  .  spironolactone (ALDACTONE) 25 MG tablet, Take 12.5 mg by mouth daily. (Patient not taking: Reported on 08/29/2020), Disp: , Rfl:  .  timolol (BETIMOL) 0.5 % ophthalmic solution, Place 1 drop into both eyes daily., Disp: , Rfl:  .  Zinc Citrate-Phytase (ZYTAZE) 25-500 MG CAPS, Take by mouth., Disp: , Rfl:   Past Medical History: Past Medical History:  Diagnosis Date  . Arthritis    osteo - shoulders, fingers  . Atrial fibrillation ([place])   . Dizziness    thinks because of diuretic  . Family history of adverse reaction to anesthesia    daughter -PONV  . Gallstones 2016, 2017  . GERD (gastroesophageal reflux disease)   . Glaucoma   . Heart murmur    history of  . Hypertension   .  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 Excised: 05/19/2016, margins free.  . Numbness in left leg    s/p hematoma  . Sciatica    right  . Seasonal allergies   . Skin cancer   . 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 Tricuspid valve disorder    leak    Tobacco Use: Social History   Tobacco Use  Smoking Status Never Smoker  Smokeless Tobacco Never Used    Labs: Recent Review Flowsheet Data    Labs for ITP Cardiac and Pulmonary Rehab Latest Ref Rng & Units 06/17/2020 06/18/2020   Cholestrol 0 - 200 mg/dL - 184   LDLCALC 0 - 99 mg/dL - 122(H)   LDLDIRECT 0 - 99 mg/dL 133.5(H) -   HDL  >40 mg/dL - 51   Trlycerides <150 mg/dL - 57   Hemoglobin A1c 4.8 - 5.6 % 5.4 -       Pulmonary Assessment Scores:  Pulmonary Assessment Scores    Row Name 09/02/20 1106         ADL UCSD   SOB Score total 48     Rest 1     Walk 2     Stairs 4     Bath 0     Dress 1     Shop 3           CAT Score   CAT Score 18           mMRC Score   mMRC Score 2            UCSD: Self-administered rating of dyspnea associated with activities of daily living (ADLs) 6-point scale (0 = "not at all" to 5 = "maximal or unable to do because of breathlessness")  Scoring Scores range from 0 to 120.  Minimally important difference is 5 units  CAT: CAT can identify the health impairment of COPD patients and is better correlated with disease progression.  CAT has a scoring range of zero to 40. The CAT score is classified into four groups of low (less than 10), medium (10 - 20), high (21-30) and very high (31-40) based on the impact level of disease on health status. A CAT score over 10 suggests significant symptoms.  A worsening CAT score could be explained by an exacerbation, poor medication adherence, poor inhaler technique, or progression of COPD or comorbid conditions.  CAT MCID is 2 points  mMRC: mMRC (Modified Medical Research Council) Dyspnea Scale is used to assess the degree of baseline functional disability in patients of respiratory disease due to dyspnea. No minimal important difference is established. A decrease in score of 1 point or greater is considered a positive change.   Pulmonary Function Assessment:  Pulmonary Function Assessment - 08/29/20 0913      Breath   Shortness of Breath Yes;Limiting activity           Exercise Target Goals: Exercise Program Goal: Individual exercise prescription set using results from initial 6 min walk test and THRR while considering  patient's activity barriers and safety.   Exercise Prescription Goal: Initial exercise prescription  builds to 30-45 minutes a day of aerobic activity, 2-3 days per week.  Home exercise guidelines will be given to patient during program as part of exercise prescription that the participant will acknowledge.  Education: Aerobic Exercise: - Group verbal and visual presentation on the components of exercise prescription. Introduces  F.I.T.T principle from ACSM for exercise prescriptions.  Reviews F.I.T.T. principles of aerobic exercise including progression. Written material given at graduation.   Education: Resistance Exercise: - Group verbal and visual presentation on the components of exercise prescription. Introduces F.I.T.T principle from ACSM for exercise prescriptions  Reviews F.I.T.T. principles of resistance exercise including progression. Written material given at graduation. Flowsheet Row Pulmonary Rehab from 11/20/2020 in [facility] Cardiac and Pulmonary Rehab  Date 11/06/20  Educator AS  Instruction Review Code 1- Verbalizes Understanding       Education: Exercise & Equipment Safety: - Individual verbal instruction and demonstration of equipment use and safety with use of the equipment. Flowsheet Row Pulmonary Rehab from 11/20/2020 in [facility] Cardiac and Pulmonary Rehab  Date 09/02/20  Educator AS  Instruction Review Code 1- Verbalizes Understanding      Education: Exercise Physiology & General Exercise Guidelines: - Group verbal and written instruction with models to review the exercise physiology of the cardiovascular system and associated critical values. Provides general exercise guidelines with specific guidelines to those with heart or lung disease.  Flowsheet Row Pulmonary Rehab from 11/20/2020 in [facility] Cardiac and Pulmonary Rehab  Date 10/23/20  Educator AS  Instruction Review Code 1- Verbalizes Understanding      Education: Flexibility, Balance, Mind/Body Relaxation: - Group verbal and visual presentation with interactive activity on the components of exercise prescription.  Introduces F.I.T.T principle from ACSM for exercise prescriptions. Reviews F.I.T.T. principles of flexibility and balance exercise training including progression. Also discusses the mind body connection.  Reviews various relaxation techniques to help reduce and manage stress (i.e. Deep breathing, progressive muscle relaxation, and visualization). Balance handout provided to take home. Written material given at graduation. Flowsheet Row Pulmonary Rehab from 11/20/2020 in [facility] Cardiac and Pulmonary Rehab  Date 09/11/20  Educator AS  Instruction Review Code 1- Verbalizes Understanding      Activity Barriers & Risk Stratification:   6 Minute Walk:  6 Minute Walk    Row Name 09/02/20 1056         6 Minute Walk   Phase Initial     Distance 950 feet     Walk Time 6 minutes     # of Rest Breaks 0     MPH 1.8     METS 1.95     RPE 12     Perceived Dyspnea  1     VO2 Peak 6.82     Symptoms No     Resting HR 69 bpm     Resting BP 142/68     Resting Oxygen Saturation  95 %     Exercise Oxygen Saturation  during 6 min walk 85 %     Max Ex. HR 111 bpm     Max Ex. BP 164/78     2 Minute Post BP 144/68           Interval HR   1 Minute HR 90     2 Minute HR 81     3 Minute HR 105     4 Minute HR 109     5 Minute HR 111     6 Minute HR 103     2 Minute Post HR 98     Interval Heart Rate? Yes           Interval Oxygen   Interval Oxygen? Yes     Baseline Oxygen Saturation % 95 %     1 Minute Oxygen Saturation % 90 %  1 Minute Liters of Oxygen 4 L  pulsed     2 Minute Oxygen Saturation % 89 %     2 Minute Liters of Oxygen 4 L  pulsed     3 Minute Oxygen Saturation % 88 %     3 Minute Liters of Oxygen 4 L  pulsed     4 Minute Oxygen Saturation % 87 %     4 Minute Liters of Oxygen 4 L  pulsed     5 Minute Oxygen Saturation % 85 %     5 Minute Liters of Oxygen 4 L  pulsed     6 Minute Oxygen Saturation % 85 %     6 Minute Liters of Oxygen 4 L  pulsed     2 Minute Post Oxygen  Saturation % 88 %     2 Minute Post Liters of Oxygen 4 L  pulsed           Oxygen Initial Assessment:  Oxygen Initial Assessment - 08/29/20 0911      Home Oxygen   Home Oxygen Device Portable Concentrator;Home Concentrator;E-Tanks    Sleep Oxygen Prescription Continuous   PRN   Liters per minute 2   PRN   Home Exercise Oxygen Prescription Continuous    Liters per minute 2   PRN   Home Resting Oxygen Prescription Continuous    Liters per minute 2   PRN   Compliance with Home Oxygen Use Yes      Initial 6 min Walk   Oxygen Used None      Program Oxygen Prescription   Program Oxygen Prescription None      Intervention   Short Term Goals To learn and exhibit compliance with exercise, home and travel O2 prescription;To learn and understand importance of monitoring SPO2 with pulse oximeter and demonstrate accurate use of the pulse oximeter.;To learn and understand importance of maintaining oxygen saturations>88%;To learn and demonstrate proper pursed lip breathing techniques or other breathing techniques.;To learn and demonstrate proper use of respiratory medications    Long  Term Goals Exhibits compliance with exercise, home and travel O2 prescription;Verbalizes importance of monitoring SPO2 with pulse oximeter and return demonstration;Maintenance of O2 saturations>88%;Exhibits proper breathing techniques, such as pursed lip breathing or other method taught during program session;Compliance with respiratory medication;Demonstrates proper use of MDI's           Oxygen Re-Evaluation:  Oxygen Re-Evaluation    Row Name 10/17/20 1411             Program Oxygen Prescription   Program Oxygen Prescription Continuous       Liters per minute 2               Home Oxygen   Home Oxygen Device Home Concentrator;Portable Concentrator;E-Tanks       Sleep Oxygen Prescription Continuous       Liters per minute 2       Home Exercise Oxygen Prescription Continuous       Liters per minute  2       Home Resting Oxygen Prescription Continuous       Liters per minute 2       Compliance with Home Oxygen Use Yes               Goals/Expected Outcomes   Short Term Goals To learn and understand importance of maintaining oxygen saturations>88%       Long  Term Goals Maintenance of O2 saturations>88%  Comments Joyce Evans does  have a pulse oximeter to check her oxygen saturation at home. Informed her where to get one and explained why it is important to have one. Reviewed that oxygen saturations should be 88 percent and above. Patient has a pulse oximeter at home to check his oxygen.       Goals/Expected Outcomes Short: monitor oxygen at home with exertion. Long: maintain oxygen saturations above 88 percent independently.              Oxygen Discharge (Final Oxygen Re-Evaluation):  Oxygen Re-Evaluation - 10/17/20 1411      Program Oxygen Prescription   Program Oxygen Prescription Continuous    Liters per minute 2      Home Oxygen   Home Oxygen Device Home Concentrator;Portable Concentrator;E-Tanks    Sleep Oxygen Prescription Continuous    Liters per minute 2    Home Exercise Oxygen Prescription Continuous    Liters per minute 2    Home Resting Oxygen Prescription Continuous    Liters per minute 2    Compliance with Home Oxygen Use Yes      Goals/Expected Outcomes   Short Term Goals To learn and understand importance of maintaining oxygen saturations>88%    Long  Term Goals Maintenance of O2 saturations>88%    Comments Joyce Evans does  have a pulse oximeter to check her oxygen saturation at home. Informed her where to get one and explained why it is important to have one. Reviewed that oxygen saturations should be 88 percent and above. Patient has a pulse oximeter at home to check his oxygen.    Goals/Expected Outcomes Short: monitor oxygen at home with exertion. Long: maintain oxygen saturations above 88 percent independently.           Initial Exercise Prescription:  Initial  Exercise Prescription - 09/02/20 1100      Date of Initial Exercise RX and Referring Provider   Date 09/02/20    Referring Provider Kloefkorn      Treadmill   MPH 1.5    Grade 0    Minutes 15    METs 2      Recumbant Bike   Level 1    RPM 60    Minutes 15    METs 2      NuStep   Level 2    SPM 80    Minutes 15    METs 2      Recumbant Elliptical   Level 1    RPM 50    Minutes 15    METs 2      REL-XR   Level 2    Watts 50    Minutes 15    METs 2      Prescription Details   Frequency (times per week) 3    Duration Progress to 30 minutes of continuous aerobic without signs/symptoms of physical distress      Intensity   THRR 40-80% of Max Heartrate 98-127    Ratings of Perceived Exertion 11-13      Progression   Progression Continue progressive overload as per policy without signs/symptoms or physical distress.      Resistance Training   Training Prescription Yes    Weight 3 lb    Reps 10-15           Perform Capillary Blood Glucose checks as needed.  Exercise Prescription Changes:  Exercise Prescription Changes    Row Name 09/02/20 1100 09/05/20 1400 09/09/20 1600 09/23/20 1200 10/21/20 1000  Response to Exercise   Blood Pressure (Admit) 142/68 -- 110/64 142/82 124/60   Blood Pressure (Exercise) 164/78 -- 120/70 138/64 142/70   Blood Pressure (Exit) 144/68 -- 122/56 124/72 114/62   Heart Rate (Admit) 69 bpm -- 62 bpm 96 bpm 70 bpm   Heart Rate (Exercise) 111 bpm -- 68 bpm 99 bpm 81 bpm   Heart Rate (Exit) 82 bpm -- 57 bpm 70 bpm 67 bpm   Oxygen Saturation (Admit) -- -- 93 % 92 % 97 %   Oxygen Saturation (Exercise) -- -- 100 % 98 % 91 %   Oxygen Saturation (Exit) -- -- 98 % 95 % 96 %   Rating of Perceived Exertion (Exercise) 12 -- $Rem'13 14 13   'OBMN$ Perceived Dyspnea (Exercise) 1 -- $Re'2 3 2   'orH$ Symptoms none -- SOB SOB SOB   Duration -- -- Continue with 30 min of aerobic exercise without signs/symptoms of physical distress. Continue with 30 min of  aerobic exercise without signs/symptoms of physical distress. Continue with 30 min of aerobic exercise without signs/symptoms of physical distress.   Intensity -- -- THRR unchanged THRR unchanged THRR unchanged     Progression   Progression -- -- Continue to progress workloads to maintain intensity without signs/symptoms of physical distress. Continue to progress workloads to maintain intensity without signs/symptoms of physical distress. Continue to progress workloads to maintain intensity without signs/symptoms of physical distress.   Average METs -- -- 1.91 2.98 2     Resistance Training   Training Prescription -- -- Yes Yes Yes   Weight -- -- 3 lb 3 lb 3 lb   Reps -- -- 10-15 10-15 10-15     Interval Training   Interval Training -- -- No No No     Treadmill   MPH -- -- 1.5 1.5 --   Grade -- -- 0 0 --   Minutes -- -- 15 15 --   METs -- -- 2.15 2.15 --     NuStep   Level -- -- 3 -- 3   SPM -- -- -- -- 80   Minutes -- -- 15 -- 15   METs -- -- 2 -- 2     Recumbant Elliptical   Level -- -- 1 -- --   Minutes -- -- 15 -- --   METs -- -- 1.5 -- --     REL-XR   Level -- -- -- 2 --   Watts -- -- -- 5 --   Minutes -- -- -- 15 --     Biostep-RELP   Level -- -- 2 -- 2   Minutes -- -- 15 -- 15   METs -- -- 2 -- 2     Home Exercise Plan   Plans to continue exercise at -- Home (comment)  has Joyce Evans (comment)  has Programmer, applications -- --   Frequency -- Add 3 additional days to program exercise sessions. Add 3 additional days to program exercise sessions. -- --   Initial Home Exercises Provided -- 09/05/20 09/05/20 -- --   Garza Name 11/06/20 0800 11/19/20 1200           Response to Exercise   Blood Pressure (Admit) 122/64 164/74      Blood Pressure (Exercise) 130/72 128/82      Blood Pressure (Exit) 122/80 132/72      Heart Rate (Admit) 68 bpm 100 bpm      Heart Rate (Exercise) 77 bpm 79 bpm  Heart Rate (Exit) 80 bpm 70 bpm      Oxygen Saturation (Admit) 99 %  87 %      Oxygen Saturation (Exercise) 94 % 98 %      Oxygen Saturation (Exit) 93 % 91 %      Rating of Perceived Exertion (Exercise) 12 13      Perceived Dyspnea (Exercise) 1 2      Symptoms SOB SOB      Duration Continue with 30 min of aerobic exercise without signs/symptoms of physical distress. Continue with 30 min of aerobic exercise without signs/symptoms of physical distress.      Intensity THRR unchanged THRR unchanged             Progression   Progression Continue to progress workloads to maintain intensity without signs/symptoms of physical distress. Continue to progress workloads to maintain intensity without signs/symptoms of physical distress.      Average METs 3.15 1.5             Resistance Training   Training Prescription Yes Yes      Weight 3 lb 3 lb      Reps 10-15 10-15             Interval Training   Interval Training No No             Recumbant Bike   Level -- 2      Minutes -- 15      METs -- 3.03             NuStep   Level 2 --      Minutes 15 --      METs 2.3 --             REL-XR   Level 3 --      Minutes 15 --      METs 4 --             T5 Nustep   Level -- 2      Minutes -- 15      METs -- 1.7             Home Exercise Plan   Plans to continue exercise at Home (comment)  has [employer] (comment)  has Programmer, applications      Frequency Add 3 additional days to program exercise sessions. Add 3 additional days to program exercise sessions.      Initial Home Exercises Provided 09/05/20 09/05/20             Exercise Comments:  Exercise Comments    Row Name 09/04/20 1418           Exercise Comments First full day of exercise!  Patient was oriented to gym and equipment including functions, settings, policies, and procedures.  Patient's individual exercise prescription and treatment plan were reviewed.  All starting workloads were established based on the results of the 6 minute walk test done at initial orientation visit.  The  plan for exercise progression was also introduced and progression will be customized based on patient's performance and goals.              Exercise Goals and Review:  Exercise Goals    Row Name 09/02/20 1104             Exercise Goals   Increase Physical Activity Yes       Intervention Provide advice, education, support and counseling about physical activity/exercise needs.;Develop an individualized exercise  prescription for aerobic and resistive training based on initial evaluation findings, risk stratification, comorbidities and participant's personal goals.       Expected Outcomes Short Term: Attend rehab on a regular basis to increase amount of physical activity.;Long Term: Add in home exercise to make exercise part of routine and to increase amount of physical activity.;Long Term: Exercising regularly at least 3-5 days a week.       Increase Strength and Stamina Yes       Intervention Provide advice, education, support and counseling about physical activity/exercise needs.;Develop an individualized exercise prescription for aerobic and resistive training based on initial evaluation findings, risk stratification, comorbidities and participant's personal goals.       Expected Outcomes Short Term: Increase workloads from initial exercise prescription for resistance, speed, and METs.;Short Term: Perform resistance training exercises routinely during rehab and add in resistance training at home;Long Term: Improve cardiorespiratory fitness, muscular endurance and strength as measured by increased METs and functional capacity (6MWT)       Able to understand and use rate of perceived exertion (RPE) scale Yes       Intervention Provide education and explanation on how to use RPE scale       Expected Outcomes Short Term: Able to use RPE daily in rehab to express subjective intensity level;Long Term:  Able to use RPE to guide intensity level when exercising independently       Able to understand  and use Dyspnea scale Yes       Intervention Provide education and explanation on how to use Dyspnea scale       Expected Outcomes Short Term: Able to use Dyspnea scale daily in rehab to express subjective sense of shortness of breath during exertion;Long Term: Able to use Dyspnea scale to guide intensity level when exercising independently       Knowledge and understanding of Target Heart Rate Range (THRR) Yes       Intervention Provide education and explanation of THRR including how the numbers were predicted and where they are located for reference       Expected Outcomes Short Term: Able to use daily as guideline for intensity in rehab;Short Term: Able to state/look up THRR;Long Term: Able to use THRR to govern intensity when exercising independently       Able to check pulse independently Yes       Intervention Provide education and demonstration on how to check pulse in carotid and radial arteries.;Review the importance of being able to check your own pulse for safety during independent exercise       Expected Outcomes Short Term: Able to explain why pulse checking is important during independent exercise;Long Term: Able to check pulse independently and accurately       Understanding of Exercise Prescription Yes       Intervention Provide education, explanation, and written materials on patient's individual exercise prescription       Expected Outcomes Short Term: Able to explain program exercise prescription;Long Term: Able to explain home exercise prescription to exercise independently              Exercise Goals Re-Evaluation :  Exercise Goals Re-Evaluation    Row Name 09/04/20 1419 09/05/20 1442 09/09/20 1622 09/23/20 1219 10/08/20 1444     Exercise Goal Re-Evaluation   Exercise Goals Review Increase Physical Activity;Knowledge and understanding of Target Heart Rate Range (THRR);Understanding of Exercise Prescription;Able to understand and use rate of perceived exertion (RPE)  scale;Increase Strength and Stamina;Able to  understand and use Dyspnea scale;Able to check pulse independently Increase Physical Activity;Increase Strength and Stamina Increase Physical Activity;Increase Strength and Stamina;Understanding of Exercise Prescription Increase Physical Activity;Increase Strength and Stamina --   Comments Reviewed RPE and dyspnea scales, THR and program prescription with Joyce Evans today.  Joyce Evans voiced understanding and was given a copy of goals to take home. Reviewed home exercise with Joyce Evans today.  Joyce Evans plans to use the [place] machine for exercise.  Reviewed THR, pulse, RPE, sign and symptoms, pulse oximetery and when to call 911 or MD.  Also discussed weather considerations and indoor options.  Joyce Evans voiced understanding. Joyce Evans is off to a good start in rehab.  Joyce Evans has completed her first three full days of exercise and already exercising on her own at home.  Joyce Evans is getting in her full 30 min already too!  We will continue to monitor her progress. Joyce Evans oxygen and BP have been in good range during exercise.  Staff will encourage trying 4 lb weights. Out for 3 week in [place]   Expected Outcomes Short: Use RPE daily to regulate intensity. Long: Follow program prescription in THR. Short: monitor HR and oxygen while exercising at home  Long: improve MET level Short: Continue to attend rehab regularly Long: Continue to improve stamina. Short: move up to 4 lb weights Long:  improve overall stamina --   Row Name 10/17/20 1413 10/21/20 1032 11/06/20 0809 11/14/20 1423 11/19/20 1254     Exercise Goal Re-Evaluation   Exercise Goals Review Increase Strength and Stamina;Increase Physical Activity Increase Physical Activity;Increase Strength and Stamina Increase Physical Activity;Increase Strength and Stamina;Understanding of Exercise Prescription Increase Physical Activity;Increase Strength and Stamina Increase Physical Activity;Increase Strength and Stamina   Comments Joyce Evans was walking and being fairly active  when Joyce Evans was out of town. Joyce Evans is ready to get back to exercsie and ready to improve more. Joyce Evans's oxygen stays in the 90s with exercise.  Joyce Evans does not quite reach THR range.  Staff will review and encourage her to reach THR range. Joyce Evans is doing well rehab.  Joyce Evans is up to level 3 on the XR.  We will continue to monitor her progress. Joyce Evans still uses her teeter at home on days not at rehab.  Joyce Evans can tell her legs are stronger.  Her knee still bothers her some when weight bearing.  Joyce Evans sees orthopedic Dr May 23.  Joyce Evans is up to level 5 on NS Joyce Evans continues to tolerate exercise well.  Staff will encourage increasing weight to 4 lb for strength work   Expected Outcomes Short:attend [name] routinely. Long: maintain an exercise routine post [name] Short:  reach THR range durign exercise Long:  improve overall stamina Short: Get NuStep back up to level 3  Long: Continue to improve stamina Short: continue to exercise consistently Long:  build overall stamina Short: try 4 lb Long: continue to build stamina          Discharge Exercise Prescription (Final Exercise Prescription Changes):  Exercise Prescription Changes - 11/19/20 1200      Response to Exercise   Blood Pressure (Admit) 164/74    Blood Pressure (Exercise) 128/82    Blood Pressure (Exit) 132/72    Heart Rate (Admit) 100 bpm    Heart Rate (Exercise) 79 bpm    Heart Rate (Exit) 70 bpm    Oxygen Saturation (Admit) 87 %    Oxygen Saturation (Exercise) 98 %    Oxygen Saturation (Exit) 91 %    Rating of Perceived Exertion (  Exercise) 13    Perceived Dyspnea (Exercise) 2    Symptoms SOB    Duration Continue with 30 min of aerobic exercise without signs/symptoms of physical distress.    Intensity THRR unchanged      Progression   Progression Continue to progress workloads to maintain intensity without signs/symptoms of physical distress.    Average METs 1.5      Resistance Training   Training Prescription Yes    Weight 3 lb    Reps 10-15       Interval Training   Interval Training No      Recumbant Bike   Level 2    Minutes 15    METs 3.03      T5 Nustep   Level 2    Minutes 15    METs 1.7      Home Exercise Plan   Plans to continue exercise at Home (comment)   has Programmer, applications   Frequency Add 3 additional days to program exercise sessions.    Initial Home Exercises Provided 09/05/20           Nutrition:  Target Goals: Understanding of nutrition guidelines, daily intake of sodium '1500mg'$ , cholesterol '200mg'$ , calories 30% from fat and 7% or less from saturated fats, daily to have 5 or more servings of fruits and vegetables.  Education: All About Nutrition: -Group instruction provided by verbal, written material, interactive activities, discussions, models, and posters to present general guidelines for heart healthy nutrition including fat, fiber, MyPlate, the role of sodium in heart healthy nutrition, utilization of the nutrition label, and utilization of this knowledge for meal planning. Follow up email sent as well. Written material given at graduation. Flowsheet Row Pulmonary Rehab from 11/20/2020 in [facility] Cardiac and Pulmonary Rehab  Date 11/20/20  Educator [facility]  Instruction Review Code 1- Verbalizes Understanding      Biometrics:  Pre Biometrics - 09/02/20 1105      Pre Biometrics   Height 5' 2.75" (1.594 m)    Weight 176 lb 3.2 oz (79.9 kg)    BMI (Calculated) 31.46            Nutrition Therapy Plan and Nutrition Goals:  Nutrition Therapy & Goals - 09/02/20 1020      Nutrition Therapy   Diet Heart healthy, low Na, pulmonary MNT    Drug/Food Interactions Statins/Certain Fruits    Protein (specify units) 80g    Fiber 25 grams    Whole Grain Foods 3 servings    Saturated Fats 12 max. grams    Fruits and Vegetables 8 servings/day    Sodium 1.5 grams      Personal Nutrition Goals   Nutrition Goal ST: Do not add salt to home cooked meals, eat 6 small meals/day LT: limit eating out to 2-3x/week,  do not cook with salt, improve breathing with meals.    Comments Joyce Evans has been getting Hello Fresh. Joyce Evans is going on a Cruise soon in March. Joyce Evans believes her problem is portion control. B: egg and cheese wrap - dunkin doughnuts or bran flake cereal with blueberries L: Joyce Evans reports eating out a lot for lunch - taco bell - bean burrito supreme and plain taco, [place] restaurants, red lobster (salmon, brussels sprouts). D: leftovers, hello fresh, or fixes a meal herself - avocado oil, green beans, sea salt, turnip greens, spinach, roasted carrots, brussells sprouts, pork chops, salmon cakes. Drinks: coffee (decaf, caramel macchiado creamer) or green tea and lots of water. Uses benecol instead of  butter. Joyce Evans reports liking cheese - havarti, [place], swiss, [place]. Joyce Evans reports eating a lot of peanut butter (natural Jiff). Uses almond milk. Joyce Evans reports eating beans with corn sometimes. Joyce Evans is still strugglin with heartburn. Reviewed heart healthy eating and pulmonary freindly eating. Plan to reduce salt at home, eat smaller -more frequent meals to help with breathing, then reduce frequency of eating out.      Intervention Plan   Intervention Prescribe, educate and counsel regarding individualized specific dietary modifications aiming towards targeted core components such as weight, hypertension, lipid management, diabetes, heart failure and other comorbidities.;Nutrition handout(s) given to patient.    Expected Outcomes Short Term Goal: Understand basic principles of dietary content, such as calories, fat, sodium, cholesterol and nutrients.;Short Term Goal: A plan has been developed with personal nutrition goals set during dietitian appointment.;Long Term Goal: Adherence to prescribed nutrition plan.           Nutrition Assessments:  MEDIFICTS Score Key:  ?70 Need to make dietary changes   40-70 Heart Healthy Diet  ? 40 Therapeutic Level Cholesterol Diet  Flowsheet Row Pulmonary Rehab from 09/02/2020 in  [facility] Cardiac and Pulmonary Rehab  Picture Your Plate Total Score on Admission 77     Picture Your Plate Scores:  <01 Unhealthy dietary pattern with much room for improvement.  41-50 Dietary pattern unlikely to meet recommendations for good health and room for improvement.  51-60 More healthful dietary pattern, with some room for improvement.   >60 Healthy dietary pattern, although there may be some specific behaviors that could be improved.   Nutrition Goals Re-Evaluation:  Nutrition Goals Re-Evaluation    [place] Name 10/17/20 1416 11/14/20 1426           Goals   Current Weight 181 lb (82.1 kg) --      Nutrition Goal Cut back on sodium --      Comment Joyce Evans knows that Joyce Evans needs to cut back on salt. Joyce Evans was out of town on a Cruise for 2 weeks. Joyce Evans feels like her weight is up since Joyce Evans left. Joyce Evans wants to work on getting her weight back down. Informed her to watch her weight and manage her sodium now that Joyce Evans is back from her trip . Joyce Evans states her weight is coming back down.  Joyce Evans continues to watch sodium.      Expected Outcome Short: decrease sodium intake. Long: lose more weight and keep a low sodium diet. Short: continue to watch sodium intake Long:  reach goal weight ad keep heart healthy diet             Nutrition Goals Discharge (Final Nutrition Goals Re-Evaluation):  Nutrition Goals Re-Evaluation - 11/14/20 1426      Goals   Comment Joyce Evans states her weight is coming back down.  Joyce Evans continues to watch sodium.    Expected Outcome Short: continue to watch sodium intake Long:  reach goal weight ad keep heart healthy diet           Psychosocial: Target Goals: Acknowledge presence or absence of significant depression and/or stress, maximize coping skills, provide positive support system. Participant is able to verbalize types and ability to use techniques and skills needed for reducing stress and depression.   Education: Stress, Anxiety, and Depression - Group verbal and  visual presentation to define topics covered.  Reviews how body is impacted by stress, anxiety, and depression.  Also discusses healthy ways to reduce stress and to treat/manage anxiety and depression.  Written material  given at graduation. Flowsheet Row Pulmonary Rehab from 11/20/2020 in [facility] Cardiac and Pulmonary Rehab  Date 10/16/20  Educator [facility]  Instruction Review Code 1- [employer] Understanding      Education: Sleep Hygiene -Provides group verbal and written instruction about how sleep can affect your health.  Define sleep hygiene, discuss sleep cycles and impact of sleep habits. Review good sleep hygiene tips.  Flowsheet Row Cardiac Rehab from 05/30/2018 in [facility] Cardiac and Pulmonary Rehab  Date 03/16/18  Educator [facility]  Instruction Review Code 1- Verbalizes Understanding      Initial Review & Psychosocial Screening:  Initial Psych Review & Screening - 08/29/20 0915      Initial Review   Current issues with Current Psychotropic Meds;Current Anxiety/Panic    Comments Joyce Evans sometimes is anxious and is a positive person in general.      Family Dynamics   Good Support System? Yes    Comments Alishba can look to her husband and her two daughters for support.      Barriers   Psychosocial barriers to participate in program The patient should benefit from training in stress management and relaxation.      Screening Interventions   Interventions To provide support and resources with identified psychosocial needs;Encouraged to exercise;Provide feedback about the scores to participant    Expected Outcomes Short Term goal: Utilizing psychosocial counselor, staff and physician to assist with identification of specific Stressors or current issues interfering with healing process. Setting desired goal for each stressor or current issue identified.;Long Term Goal: Stressors or current issues are controlled or eliminated.;Short Term goal: Identification and review with participant of any Quality of  Life or Depression concerns found by scoring the questionnaire.;Long Term goal: The participant improves quality of Life and PHQ9 Scores as seen by post scores and/or verbalization of changes           Quality of Life Scores:  Scores of 19 and below usually indicate a poorer quality of life in these areas.  A difference of  2-3 points is a clinically meaningful difference.  A difference of 2-3 points in the total score of the Quality of Life Index has been associated with significant improvement in overall quality of life, self-image, physical symptoms, and general health in studies assessing change in quality of life.  PHQ-9: Recent Review Flowsheet Data    Depression screen [facility] 2/9 09/02/2020 05/30/2018 03/08/2018   Decreased Interest 0 0 1   Down, Depressed, Hopeless 0 0 0   PHQ - 2 Score 0 0 1   Altered sleeping - - 0   Tired, decreased energy $RemoveBeforeDE'1 1 1   '[id number]$ Change in appetite 1 0 0   Feeling bad or failure about yourself  0 0 0   Trouble concentrating 0 0 0   Moving slowly or fidgety/restless 0 0 0   Suicidal thoughts 0 0 0   PHQ-9 Score - - 2   Difficult doing work/chores Not difficult at all - Not difficult at all     Interpretation of Total Score  Total Score Depression Severity:  1-4 = Minimal depression, 5-9 = Mild depression, 10-14 = Moderate depression, 15-19 = Moderately severe depression, 20-27 = Severe depression   Psychosocial Evaluation and Intervention:  Psychosocial Evaluation - 08/29/20 0917      Psychosocial Evaluation & Interventions   Interventions Encouraged to exercise with the program and follow exercise prescription;Relaxation education;Stress management education    Comments Joyce Evans sometimes is anxious and is a positive person in  general.Joyce Evans can look to her husband and her two daughters for support.    Expected Outcomes Short: Exercise regularly to support mental health and notify staff of any changes. Long: maintain mental health and well being through teaching  of rehab or prescribed medications independently.    Continue Psychosocial Services  Follow up required by staff           Psychosocial Re-Evaluation:  Psychosocial Re-Evaluation    [place] Name 10/17/20 1423 11/14/20 1428           Psychosocial Re-Evaluation   Current issues with None Identified None Identified      Comments Patient reports no issues with their current mental states, sleep, stress, depression or anxiety. Will follow up with patient in a few weeks for any changes. Joyce Evans reports no changes in stress.  Joyce Evans says her husband likes to travel ad they have trips planned.      Expected Outcomes Short: Continue to exercise regularly to support mental health and notify staff of any changes. Long: maintain mental health and well being through teaching of rehab or prescribed medications independently. Short:  continue to exercise to help keep strong Long:  maintain positive outlook      Interventions Encouraged to attend Pulmonary Rehabilitation for the exercise --      Continue Psychosocial Services  Follow up required by staff --             Psychosocial Discharge (Final Psychosocial Re-Evaluation):  Psychosocial Re-Evaluation - 11/14/20 1428      Psychosocial Re-Evaluation   Current issues with None Identified    Comments Joyce Evans reports no changes in stress.  Joyce Evans says her husband likes to travel ad they have trips planned.    Expected Outcomes Short:  continue to exercise to help keep strong Long:  maintain positive outlook           Education: Education Goals: Education classes will be provided on a weekly basis, covering required topics. Participant will state understanding/return demonstration of topics presented.  Learning Barriers/Preferences:  Learning Barriers/Preferences - 08/29/20 0910      Learning Barriers/Preferences   Learning Barriers None    Learning Preferences None           General Pulmonary Education Topics:  Infection Prevention: - Provides  verbal and written material to individual with discussion of infection control including proper hand washing and proper equipment cleaning during exercise session. Flowsheet Row Pulmonary Rehab from 11/20/2020 in [facility] Cardiac and Pulmonary Rehab  Date 09/02/20  Educator As  Instruction Review Code 1- Verbalizes Understanding      Falls Prevention: - Provides verbal and written material to individual with discussion of falls prevention and safety. Flowsheet Row Pulmonary Rehab from 08/29/2020 in [facility] Cardiac and Pulmonary Rehab  Date 08/29/20  Educator [facility]  Instruction Review Code 1- Verbalizes Understanding      Chronic Lung Disease Review: - Group verbal instruction with posters, models, PowerPoint presentations and videos,  to review new updates, new respiratory medications, new advancements in procedures and treatments. Providing information on websites and "800" numbers for continued self-education. Includes information about supplement oxygen, available portable oxygen systems, continuous and intermittent flow rates, oxygen safety, concentrators, and Medicare reimbursement for oxygen. Explanation of Pulmonary Drugs, including class, frequency, complications, importance of spacers, rinsing mouth after steroid MDI's, and proper cleaning methods for nebulizers. Review of basic lung anatomy and physiology related to function, structure, and complications of lung disease. Review of risk factors. Discussion about methods for  diagnosing sleep apnea and types of masks and machines for OSA. Includes a review of the use of types of environmental controls: home humidity, furnaces, filters, dust mite/pet prevention, HEPA vacuums. Discussion about weather changes, air quality and the benefits of nasal washing. Instruction on Warning signs, infection symptoms, calling MD promptly, preventive modes, and value of vaccinations. Review of effective airway clearance, coughing and/or vibration techniques.  Emphasizing that all should Create an Action Plan. Written material given at graduation. Flowsheet Row Pulmonary Rehab from 11/20/2020 in [facility] Cardiac and Pulmonary Rehab  Date 10/09/20  Educator [facility]  Instruction Review Code 1- Verbalizes Understanding      AED/CPR: - Group verbal and written instruction with the use of models to demonstrate the basic use of the AED with the basic ABC's of resuscitation.    Anatomy and Cardiac Procedures: - Group verbal and visual presentation and models provide information about basic cardiac anatomy and function. Reviews the testing methods done to diagnose heart disease and the outcomes of the test results. Describes the treatment choices: Medical Management, Angioplasty, or Coronary Bypass Surgery for treating various heart conditions including Myocardial Infarction, Angina, Valve Disease, and Cardiac Arrhythmias.  Written material given at graduation. Flowsheet Row Pulmonary Rehab from 11/20/2020 in [facility] Cardiac and Pulmonary Rehab  Date 11/06/20  Educator [facility]  Instruction Review Code 1- Verbalizes Understanding      Medication Safety: - Group verbal and visual instruction to review commonly prescribed medications for heart and lung disease. Reviews the medication, class of the drug, and side effects. Includes the steps to properly store meds and maintain the prescription regimen.  Written material given at graduation.   Other: -Provides group and verbal instruction on various topics (see comments)   Knowledge Questionnaire Score:  Knowledge Questionnaire Score - 09/02/20 1109      Knowledge Questionnaire Score   Pre Score 16/18 inhaler            Core Components/Risk Factors/Patient Goals at Admission:  Personal Goals and Risk Factors at Admission - 09/02/20 1114      Core Components/Risk Factors/Patient Goals on Admission    Weight Management Yes;Weight Loss    Intervention Weight Management: Develop a combined nutrition and exercise  program designed to reach desired caloric intake, while maintaining appropriate intake of nutrient and fiber, sodium and fats, and appropriate energy expenditure required for the weight goal.;Weight Management: Provide education and appropriate resources to help participant work on and attain dietary goals.;Weight Management/Obesity: Establish reasonable short term and long term weight goals.    Admit Weight 176 lb 3.2 oz (79.9 kg)    Goal Weight: Short Term 175 lb (79.4 kg)    Goal Weight: Long Term 170 lb (77.1 kg)    Expected Outcomes Short Term: Continue to assess and modify interventions until short term weight is achieved;Long Term: Adherence to nutrition and physical activity/exercise program aimed toward attainment of established weight goal;Weight Loss: Understanding of general recommendations for a balanced deficit meal plan, which promotes 1-2 lb weight loss per week and includes a negative energy balance of [phone] kcal/d;Understanding recommendations for meals to include 15-35% energy as protein, 25-35% energy from fat, 35-60% energy from carbohydrates, less than $RemoveB'200mg'XYKIAhst$  of dietary cholesterol, 20-35 gm of total fiber daily;Understanding of distribution of calorie intake throughout the day with the consumption of 4-5 meals/snacks    Improve shortness of breath with ADL's Yes    Intervention Provide education, individualized exercise plan and daily activity instruction to help decrease symptoms of SOB  with activities of daily living.    Expected Outcomes Short Term: Improve cardiorespiratory fitness to achieve a reduction of symptoms when performing ADLs;Long Term: Be able to perform more ADLs without symptoms or delay the onset of symptoms    Heart Failure Yes    Intervention Provide a combined exercise and nutrition program that is supplemented with education, support and counseling about heart failure. Directed toward relieving symptoms such as shortness of breath, decreased exercise  tolerance, and extremity edema.    Expected Outcomes Improve functional capacity of life;Short term: Attendance in program 2-3 days a week with increased exercise capacity. Reported lower sodium intake. Reported increased fruit and vegetable intake. Reports medication compliance.;Short term: Daily weights obtained and reported for increase. Utilizing diuretic protocols set by physician.;Long term: Adoption of self-care skills and reduction of barriers for early signs and symptoms recognition and intervention leading to self-care maintenance.    Hypertension Yes    Intervention Provide education on lifestyle modifcations including regular physical activity/exercise, weight management, moderate sodium restriction and increased consumption of fresh fruit, vegetables, and low fat dairy, alcohol moderation, and smoking cessation.;Monitor prescription use compliance.    Expected Outcomes Short Term: Continued assessment and intervention until BP is < 140/66mm HG in hypertensive participants. < 130/34mm HG in hypertensive participants with diabetes, heart failure or chronic kidney disease.;Long Term: Maintenance of blood pressure at goal levels.    Lipids Yes    Intervention Provide education and support for participant on nutrition & aerobic/resistive exercise along with prescribed medications to achieve LDL '70mg'$ , HDL >$Remo'40mg'JHSWG$ .    Expected Outcomes Short Term: Participant states understanding of desired cholesterol values and is compliant with medications prescribed. Participant is following exercise prescription and nutrition guidelines.;Long Term: Cholesterol controlled with medications as prescribed, with individualized exercise RX and with personalized nutrition plan. Value goals: LDL < $Rem'70mg'MqRS$ , HDL > 40 mg.           Education:Diabetes - Individual verbal and written instruction to review signs/symptoms of diabetes, desired ranges of glucose level fasting, after meals and with exercise. Acknowledge that pre  and post exercise glucose checks will be done for 3 sessions at entry of program.   Know Your Numbers and Heart Failure: - Group verbal and visual instruction to discuss disease risk factors for cardiac and pulmonary disease and treatment options.  Reviews associated critical values for Overweight/Obesity, Hypertension, Cholesterol, and Diabetes.  Discusses basics of heart failure: signs/symptoms and treatments.  Introduces Heart Failure Zone chart for action plan for heart failure.  Written material given at graduation.   Core Components/Risk Factors/Patient Goals Review:   Goals and Risk Factor Review    Row Name 10/17/20 1414 11/14/20 1420           Core Components/Risk Factors/Patient Goals Review   Personal Goals Review Improve shortness of breath with ADL's Improve shortness of breath with ADL's;Develop more efficient breathing techniques such as purse lipped breathing and diaphragmatic breathing and practicing self-pacing with activity.      Review Spoke to patient about their shortness of breath and what they can do to improve. Patient has been informed of breathing techniques when starting the program. Patient is informed to tell staff if they have had any med changes and that certain meds they are taking or not taking can be causing shortness of breath. Marcene states her breathing has been better and Joyce Evans rarely needs her oxygen at home.  Joyce Evans is using PLB and a spirometer at home.  They had mold removed  from their home and feels that has helped as well.      Expected Outcomes Short: Attend [name] regularly to improve shortness of breath with ADL's. Long: maintain independence with ADL's Short: conintue to exercise and practice PL breathing Long: maintian idependence with ADLs             Core Components/Risk Factors/Patient Goals at Discharge (Final Review):   Goals and Risk Factor Review - 11/14/20 1420      Core Components/Risk Factors/Patient Goals Review   Personal Goals  Review Improve shortness of breath with ADL's;Develop more efficient breathing techniques such as purse lipped breathing and diaphragmatic breathing and practicing self-pacing with activity.    Review Korynne states her breathing has been better and Joyce Evans rarely needs her oxygen at home.  Joyce Evans is using PLB and a spirometer at home.  They had mold removed from their home and feels that has helped as well.    Expected Outcomes Short: conintue to exercise and practice PL breathing Long: maintian idependence with ADLs           ITP Comments:  ITP Comments    Row Name 08/29/20 0910 09/02/20 1119 09/04/20 0633 09/04/20 1418 10/02/20 0742   ITP Comments Virtual Visit completed. Patient informed on EP and RD appointment and 6 Minute walk test. Patient also informed of patient health questionnaires on My Chart. Patient Verbalizes understanding. Visit diagnosis can be found in [facility]2/09/2020. Completed 6MWT and gym orientation. Initial ITP created and sent for review to Dr. Emily Filbert, Medical Director. 30 Day review completed. Medical Director ITP review done, changes made as directed, and signed approval by Medical Director.  New to program First full day of exercise!  Patient was oriented to gym and equipment including functions, settings, policies, and procedures.  Patient's individual exercise prescription and treatment plan were reviewed.  All starting workloads were established based on the results of the 6 minute walk test done at initial orientation visit.  The plan for exercise progression was also introduced and progression will be customized based on patient's performance and goals. 30 Day review completed. Medical Director ITP review done, changes made as directed, and signed approval by Medical Director.   Row Name 10/08/20 1444 10/30/20 0956 11/27/20 0655       ITP Comments Out for 3 weeks in [place] 30 Day review completed. Medical Director ITP review done, changes made as directed, and signed approval  by Medical Director. 30 Day review completed. Medical Director ITP review done, changes made as directed, and signed approval by Medical Director.            Comments:

## 2020-11-28 ENCOUNTER — Other Ambulatory Visit: Payer: Self-pay

## 2020-11-28 ENCOUNTER — Encounter: Payer: Medicare PPO | Admitting: *Deleted

## 2020-11-28 DIAGNOSIS — J849 Interstitial pulmonary disease, unspecified: Secondary | ICD-10-CM | POA: Diagnosis not present

## 2020-11-28 NOTE — Progress Notes (Signed)
Daily Session Note  Patient Details  Name: Joyce Evans MRN: [id number] Date of Birth: 06/16/1941 Referring Provider:   Flowsheet Row Pulmonary Rehab from 09/02/2020 in [facility] Cardiac and Pulmonary Rehab  Referring Provider Kloefkorn      Encounter Date: 11/28/2020  Check In:  Session Check In - 11/28/20 1528      Check-In   Supervising physician immediately available to respond to emergencies See telemetry face sheet for immediately available ER MD    Location [facility]-Cardiac & Pulmonary Rehab    Staff Present Renita Papa, RN BSN;Joseph [address] [place], [place], RCEP, CCRP, CCET    Virtual Visit No    Medication changes reported     No    Fall or balance concerns reported    No    Tobacco Cessation No Change    Warm-up and Cool-down Performed on first and last piece of equipment    Resistance Training Performed Yes    VAD Patient? No    PAD/SET Patient? No      Pain Assessment   Currently in Pain? No/denies              Social History   Tobacco Use  Smoking Status Never Smoker  Smokeless Tobacco Never Used    Goals Met:  Independence with exercise equipment Exercise tolerated well No report of cardiac concerns or symptoms Strength training completed today  Goals Unmet:  Not Applicable  Comments: Pt able to follow exercise prescription today without complaint.  Will continue to monitor for progression.    Dr. Emily Filbert is Medical Director for [place] and [name].

## 2020-12-02 ENCOUNTER — Other Ambulatory Visit: Payer: Self-pay

## 2020-12-02 DIAGNOSIS — J849 Interstitial pulmonary disease, unspecified: Secondary | ICD-10-CM | POA: Diagnosis not present

## 2020-12-02 NOTE — Progress Notes (Signed)
Daily Session Note  Patient Details  Name: Joyce Evans MRN: [id number] Date of Birth: 02/21/1941 Referring Provider:   Flowsheet Row Pulmonary Rehab from 09/02/2020 in [facility] Cardiac and Pulmonary Rehab  Referring Provider Kloefkorn      Encounter Date: 12/02/2020  Check In:  Session Check In - 12/02/20 1407      Check-In   Supervising physician immediately available to respond to emergencies See telemetry face sheet for immediately available ER MD    Location [facility]-Card[facility] & Pulmonary Rehab    Staff Present Birdie Sons, MPA, Mauricia Area, BS, ACSM CEP, Exercise Physiologist;Kara Eliezer Bottom, MS, ASCM CEP, Exercise Physiologist    Virtual Visit No    Medication changes reported     No    Fall or balance concerns reported    No    Tobacco Cessation No Change    Warm-up and Cool-down Performed on first and last piece of equipment    Resistance Training Performed Yes    VAD Patient? No    PAD/SET Patient? No      Pain Assessment   Currently in Pain? No/denies              Social History   Tobacco Use  Smoking Status Never Smoker  Smokeless Tobacco Never Used    Goals Met:  Independence with exercise equipment Exercise tolerated well No report of cardiac concerns or symptoms Strength training completed today  Goals Unmet:  Not Applicable  Comments: Pt able to follow exercise prescription today without complaint.  Will continue to monitor for progression.    Dr. Emily Filbert is Medical Director for [place].  Dr. Ottie Glazier is Medical Director for [facility] Pulmonary Rehabilitation.

## 2020-12-04 ENCOUNTER — Other Ambulatory Visit: Payer: Self-pay

## 2020-12-04 DIAGNOSIS — J849 Interstitial pulmonary disease, unspecified: Secondary | ICD-10-CM | POA: Diagnosis not present

## 2020-12-04 NOTE — Progress Notes (Signed)
Daily Session Note  Patient Details  Name: Joyce Evans MRN: [id number] Date of Birth: [birth year] Referring Provider:   Flowsheet Row Pulmonary Rehab from 09/02/2020 in [facility] Cardiac and Pulmonary Rehab  Referring Provider Kloefkorn      Encounter Date: 12/04/2020  Check In:  Session Check In - 12/04/20 1407      Check-In   Supervising physician immediately available to respond to emergencies See telemetry face sheet for immediately available ER MD    Location [facility] & Pul[facility] Re[facility]    Staff Present Birdie Sons, MPA, RN;Joseph Lou Miner, MS, ASCM CEP, Exercise Physiologist    Virtual Visit No    Medication changes reported     No    Fall or balance concerns reported    No    Tobacco Cessation No Change    Warm-up and Cool-down Performed on first and last piece of equipment    Resistance Training Performed Yes    VAD Patient? No    PAD/SET Patient? No      Pain Assessment   Currently in Pain? No/denies              Social History   Tobacco Use  Smoking Status Never Smoker  Smokeless Tobacco Never Used    Goals Met:  Independence with exercise equipment Exercise tolerated well No report of cardiac concerns or symptoms Strength training completed today  Goals Unmet:  Not Applicable  Comments: Pt able to follow exercise prescription today without complaint.  Will continue to monitor for progression.    Dr. Emily Filbert is Medical Director for [place].  Dr. Ottie Glazier is Medical Director for [facility] Pulmonary Rehabilitation.

## 2020-12-05 ENCOUNTER — Encounter: Payer: Medicare PPO | Admitting: *Deleted

## 2020-12-05 DIAGNOSIS — J849 Interstitial pulmonary disease, unspecified: Secondary | ICD-10-CM | POA: Diagnosis not present

## 2020-12-05 NOTE — Progress Notes (Signed)
Daily Session Note  Patient Details  Name: Joyce Evans MRN: [id number] Date of Birth: 10-20-1940 Referring Provider:   Flowsheet Row Pulmonary Rehab from 09/02/2020 in [facility] Cardiac and Pulmonary Rehab  Referring Provider Kloefkorn      Encounter Date: 12/05/2020  Check In:  Session Check In - 12/05/20 1403      Check-In   Supervising physician immediately available to respond to emergencies See telemetry face sheet for immediately available ER MD    Location [facility]-Cardiac & Pulmonary Rehab    Staff Present Renita Papa, RN BSN;Melissa Caiola RDN, LDN;Jessica Luan Pulling, MA, RCEP, CCRP, CCET    Virtual Visit No    Medication changes reported     No    Fall or balance concerns reported    No    Tobacco Cessation No Change    Warm-up and Cool-down Performed on first and last piece of equipment    Resistance Training Performed Yes    VAD Patient? No    PAD/SET Patient? No      Pain Assessment   Currently in Pain? No/denies              Social History   Tobacco Use  Smoking Status Never Smoker  Smokeless Tobacco Never Used    Goals Met:  Independence with exercise equipment Exercise tolerated well No report of cardiac concerns or symptoms Strength training completed today  Goals Unmet:  Not Applicable  Comments: Pt able to follow exercise prescription today without complaint.  Will continue to monitor for progression.    Dr. Emily Filbert is Medical Director for [place].  Dr. Ottie Glazier is Medical Director for [facility] Pulmonary Rehabilitation.

## 2020-12-11 ENCOUNTER — Other Ambulatory Visit: Payer: Self-pay

## 2020-12-11 ENCOUNTER — Encounter: Payer: Medicare PPO | Attending: Internal Medicine | Admitting: *Deleted

## 2020-12-11 DIAGNOSIS — J849 Interstitial pulmonary disease, unspecified: Secondary | ICD-10-CM

## 2020-12-11 NOTE — Progress Notes (Signed)
Daily Session Note  Patient Details  Name: Joyce Evans MRN: [id number] Date of Birth: [birth year] Referring Provider:   Flowsheet Row Pulmonary Rehab from 09/02/2020 in [facility] Cardiac and Pulmonary Rehab  Referring Provider Kloefkorn      Encounter Date: 12/11/2020  Check In:  Session Check In - 12/11/20 1407      Check-In   Supervising physician immediately available to respond to emergencies See telemetry face sheet for immediately available ER MD    Location [facility] Pulmonary Re[facility]    Staff Present Renita Papa, RN Margurite Auerbach, MS, ASCM CEP, Exercise Physiologist;Melissa Caiola RDN, LDN    Virtual Visit No    Medication changes reported     No    Fall or balance concerns reported    No    Warm-up and Cool-down Performed on first and last piece of equipment    Resistance Training Performed Yes    VAD Patient? No    PAD/SET Patient? No      Pain Assessment   Currently in Pain? No/denies              Social History   Tobacco Use  Smoking Status Never Smoker  Smokeless Tobacco Never Used    Goals Met:  Independence with exercise equipment Exercise tolerated well No report of cardiac concerns or symptoms Strength training completed today  Goals Unmet:  Not Applicable  Comments: Pt able to follow exercise prescription today without complaint.  Will continue to monitor for progression.    Dr. Emily Filbert is Medical Director for [place].  Dr. Ottie Glazier is Medical Director for [facility] Pulmonary Rehabilitation.

## 2020-12-12 ENCOUNTER — Encounter: Payer: Medicare PPO | Admitting: *Deleted

## 2020-12-12 ENCOUNTER — Other Ambulatory Visit: Payer: Self-pay

## 2020-12-12 DIAGNOSIS — J849 Interstitial pulmonary disease, unspecified: Secondary | ICD-10-CM

## 2020-12-12 NOTE — Progress Notes (Signed)
Daily Session Note  Patient Details  Name: Joyce Evans MRN: [id number] Date of Birth: [birth year] Referring Provider:   Flowsheet Row Pulmonary Rehab from 09/02/2020 in [facility] Cardiac and Pulmonary Rehab  Referring Provider Kloefkorn      Encounter Date: 12/12/2020  Check In:  Session Check In - 12/12/20 1452      Check-In   Supervising physician immediately available to respond to emergencies See telemetry face sheet for immediately available ER MD    Location [facility]-Cardiac & Pulmonary Rehab    Staff Present Renita Papa, RN BSN;Joseph [address] [place], [place], [place], CCRP, CCET    Virtual Visit No    Medication changes reported     No    Fall or balance concerns reported    No    Warm-up and Cool-down Performed on first and last piece of equipment    Resistance Training Performed Yes    VAD Patient? No    PAD/SET Patient? No      Pain Assessment   Currently in Pain? No/denies              Social History   Tobacco Use  Smoking Status Never Smoker  Smokeless Tobacco Never Used    Goals Met:  Independence with exercise equipment Exercise tolerated well No report of cardiac concerns or symptoms Strength training completed today  Goals Unmet:  Not Applicable  Comments: Pt able to follow exercise prescription today without complaint.  Will continue to monitor for progression.    Dr. Emily Filbert is Medical Director for [place].  Dr. Ottie Glazier is Medical Director for [facility] Pulmonary Rehabilitation.

## 2020-12-25 ENCOUNTER — Other Ambulatory Visit: Payer: Self-pay

## 2020-12-25 ENCOUNTER — Encounter: Payer: Self-pay | Admitting: *Deleted

## 2020-12-25 DIAGNOSIS — J849 Interstitial pulmonary disease, unspecified: Secondary | ICD-10-CM | POA: Diagnosis not present

## 2020-12-25 NOTE — Progress Notes (Signed)
Pulmonary Individual Treatment Plan  Patient Details  Name: Joyce Evans MRN: [id number] Date of Birth: 08-12-1940 Referring Provider:   Flowsheet Row Pulmonary Rehab from 09/02/2020 in [facility] Cardiac and Pulmonary Rehab  Referring Provider Kloefkorn       Initial Encounter Date:  Flowsheet Row Pulmonary Rehab from 09/02/2020 in [facility] Cardiac and Pulmonary Rehab  Date 09/02/20       Visit Diagnosis: ILD (interstitial lung disease) ([place])  Patient's Home Medications on Admission:  Current Outpatient Medications:    acetaminophen (TYLENOL) 500 MG tablet, Take 500 mg by mouth every 6 (six) hours as needed., Disp: , Rfl:    amLODipine (NORVASC) 5 MG tablet, Take 5 mg by mouth daily., Disp: , Rfl:    apixaban (ELIQUIS) 5 MG TABS tablet, Take 5 mg by mouth 2 (two) times daily. , Disp: , Rfl:    atorvastatin (LIPITOR) 40 MG tablet, Take 1 tablet (40 mg total) by mouth daily., Disp: 30 tablet, Rfl: 3   atorvastatin (LIPITOR) 40 MG tablet, Take by mouth., Disp: , Rfl:    azelastine (ASTELIN) 0.1 % nasal spray, Place 2 sprays into both nostrils 2 (two) times daily. (Patient not taking: Reported on 08/29/2020), Disp: , Rfl:    brimonidine (ALPHAGAN P) 0.1 % SOLN, Place 1 drop into the right eye 2 (two) times daily., Disp: , Rfl:    Brinzolamide-Brimonidine 1-0.2 % SUSP, Place 1 drop into the left eye 2 (two) times daily., Disp: , Rfl:    cetirizine (ZYRTEC) 10 MG tablet, Take 10 mg by mouth daily. (Patient not taking: Reported on 08/29/2020), Disp: , Rfl:    Cholecalciferol (VITAMIN D3) 3000 units TABS, Take 3,000-6,000 Units by mouth 2 (two) times daily. , Disp: , Rfl:    Cholecalciferol 50 MCG (2000 UT) CAPS, Take by mouth., Disp: , Rfl:    fluticasone (FLONASE) 50 MCG/ACT nasal spray, Place 2 sprays into the nose daily., Disp: , Rfl:    furosemide (LASIX) 20 MG tablet, Take 20 mg by mouth daily., Disp: , Rfl:    furosemide (LASIX) 20 MG tablet, Take by mouth. (Patient not taking: Reported on  08/29/2020), Disp: , Rfl:    losartan (COZAAR) 100 MG tablet, Take 100 mg by mouth daily., Disp: , Rfl:    multivitamin-iron-minerals-folic acid (CENTRUM) chewable tablet, Chew 1 tablet by mouth daily., Disp: , Rfl:    omeprazole (PRILOSEC) 20 MG capsule, Take 20 mg by mouth 2 (two) times daily. Reported on 08/28/2015 (Patient not taking: Reported on 08/29/2020), Disp: , Rfl:    omeprazole (PRILOSEC) 40 MG capsule, Take by mouth., Disp: , Rfl:    Probiotic Product (PROBIOTIC DAILY PO), Take by mouth daily., Disp: , Rfl:    spironolactone (ALDACTONE) 25 MG tablet, Take 12.5 mg by mouth daily. (Patient not taking: Reported on 08/29/2020), Disp: , Rfl:    timolol (BETIMOL) 0.5 % ophthalmic solution, Place 1 drop into both eyes daily., Disp: , Rfl:    Zinc Citrate-Phytase (ZYTAZE) 25-500 MG CAPS, Take by mouth., Disp: , Rfl:   Past Medical History: Past Medical History:  Diagnosis Date   Arthritis    osteo - shoulders, fingers   Atrial fibrillation ([place])    Dizziness    thinks because of diuretic   Family history of adverse reaction to anesthesia    daughter -PONV   Gallstones 2016, 2017   GERD (gastroesophageal reflux disease)    Glaucoma    Heart murmur    history of   Hypertension  Melanoma ([place]) 04/20/2016   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 Excised: 05/19/2016, margins free.   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   Tricuspid valve disorder    leak    Tobacco Use: Social History   Tobacco Use  Smoking Status Never  Smokeless Tobacco Never    Labs: Recent Review Flowsheet Data     Labs for ITP Cardiac and Pulmonary Rehab Latest Ref Rng & Units 06/17/2020 06/18/2020   Cholestrol 0 - 200 mg/dL - 184   LDLCALC 0 - 99 mg/dL - 122(H)   LDLDIRECT 0 - 99 mg/dL 133.5(H) -   HDL >40 mg/dL - 51   Trlycerides <150 mg/dL - 57    Hemoglobin A1c 4.8 - 5.6 % 5.4 -        Pulmonary Assessment Scores:  Pulmonary Assessment Scores     Row Name 09/02/20 1106         ADL UCSD   SOB Score total 48     Rest 1     Walk 2     Stairs 4     Bath 0     Dress 1     Shop 3           CAT Score     CAT Score 18           mMRC Score     mMRC Score 2             UCSD: Self-administered rating of dyspnea associated with activities of daily living (ADLs) 6-point scale (0 = "not at all" to 5 = "maximal or unable to do because of breathlessness")  Scoring Scores range from 0 to 120.  Minimally important difference is 5 units  CAT: CAT can identify the health impairment of COPD patients and is better correlated with disease progression.  CAT has a scoring range of zero to 40. The CAT score is classified into four groups of low (less than 10), medium (10 - 20), high (21-30) and very high (31-40) based on the impact level of disease on health status. A CAT score over 10 suggests significant symptoms.  A worsening CAT score could be explained by an exacerbation, poor medication adherence, poor inhaler technique, or progression of COPD or comorbid conditions.  CAT MCID is 2 points  mMRC: mMRC (Modified Medical Research Council) Dyspnea Scale is used to assess the degree of baseline functional disability in patients of respiratory disease due to dyspnea. No minimal important difference is established. A decrease in score of 1 point or greater is considered a positive change.   Pulmonary Function Assessment:  Pulmonary Function Assessment - 08/29/20 0913       Breath   Shortness of Breath Yes;Limiting activity             Exercise Target Goals: Exercise Program Goal: Individual exercise prescription set using results from initial 6 min walk test and THRR while considering  patient's activity barriers and safety.   Exercise Prescription Goal: Initial exercise prescription builds to 30-45 minutes a day of  aerobic activity, 2-3 days per week.  Home exercise guidelines will be given to patient during program as part of exercise prescription that the participant will acknowledge.  Education: Aerobic Exercise: - Group verbal and  visual presentation on the components of exercise prescription. Introduces F.I.T.T principle from ACSM for exercise prescriptions.  Reviews F.I.T.T. principles of aerobic exercise including progression. Written material given at graduation.   Education: Resistance Exercise: - Group verbal and visual presentation on the components of exercise prescription. Introduces F.I.T.T principle from ACSM for exercise prescriptions  Reviews F.I.T.T. principles of resistance exercise including progression. Written material given at graduation. Flowsheet Row Pulmonary Rehab from 12/11/2020 in [facility] Cardiac and Pulmonary Rehab  Date 11/06/20  Educator AS  Instruction Review Code 1- Verbalizes Understanding        Education: Exercise & Equipment Safety: - Individual verbal instruction and demonstration of equipment use and safety with use of the equipment. Flowsheet Row Pulmonary Rehab from 12/11/2020 in [facility] Cardiac and Pulmonary Rehab  Date 09/02/20  Educator AS  Instruction Review Code 1- Verbalizes Understanding       Education: Exercise Physiology & General Exercise Guidelines: - Group verbal and written instruction with models to review the exercise physiology of the cardiovascular system and associated critical values. Provides general exercise guidelines with specific guidelines to those with heart or lung disease.  Flowsheet Row Pulmonary Rehab from 12/11/2020 in [facility] Cardiac and Pulmonary Rehab  Date 10/23/20  Educator AS  Instruction Review Code 1- Verbalizes Understanding       Education: Flexibility, Balance, Mind/Body Relaxation: - Group verbal and visual presentation with interactive activity on the components of exercise prescription. Introduces F.I.T.T principle  from ACSM for exercise prescriptions. Reviews F.I.T.T. principles of flexibility and balance exercise training including progression. Also discusses the mind body connection.  Reviews various relaxation techniques to help reduce and manage stress (i.e. Deep breathing, progressive muscle relaxation, and visualization). Balance handout provided to take home. Written material given at graduation. Flowsheet Row Pulmonary Rehab from 12/11/2020 in [facility] Cardiac and Pulmonary Rehab  Date 09/11/20  Educator AS  Instruction Review Code 1- Verbalizes Understanding       Activity Barriers & Risk Stratification:   6 Minute Walk:  6 Minute Walk     Row Name 09/02/20 1056         6 Minute Walk   Phase Initial     Distance 950 feet     Walk Time 6 minutes     # of Rest Breaks 0     MPH 1.8     METS 1.95     RPE 12     Perceived Dyspnea  1     VO2 Peak 6.82     Symptoms No     Resting HR 69 bpm     Resting BP 142/68     Resting Oxygen Saturation  95 %     Exercise Oxygen Saturation  during 6 min walk 85 %     Max Ex. HR 111 bpm     Max Ex. BP 164/78     2 Minute Post BP 144/68           Interval HR     1 Minute HR 90     2 Minute HR 81     3 Minute HR 105     4 Minute HR 109     5 Minute HR 111     6 Minute HR 103     2 Minute Post HR 98     Interval Heart Rate? Yes           Interval Oxygen     Interval Oxygen? Yes     Baseline  Oxygen Saturation % 95 %     1 Minute Oxygen Saturation % 90 %     1 Minute Liters of Oxygen 4 L  pulsed     2 Minute Oxygen Saturation % 89 %     2 Minute Liters of Oxygen 4 L  pulsed     3 Minute Oxygen Saturation % 88 %     3 Minute Liters of Oxygen 4 L  pulsed     4 Minute Oxygen Saturation % 87 %     4 Minute Liters of Oxygen 4 L  pulsed     5 Minute Oxygen Saturation % 85 %     5 Minute Liters of Oxygen 4 L  pulsed     6 Minute Oxygen Saturation % 85 %     6 Minute Liters of Oxygen 4 L  pulsed     2 Minute Post Oxygen Saturation % 88 %      2 Minute Post Liters of Oxygen 4 L  pulsed            Oxygen Initial Assessment:  Oxygen Initial Assessment - 08/29/20 0911       Home Oxygen   Home Oxygen Device Portable Concentrator;Home Concentrator;E-Tanks    Sleep Oxygen Prescription Continuous   PRN   Liters per minute 2   PRN   Home Exercise Oxygen Prescription Continuous    Liters per minute 2   PRN   Home Resting Oxygen Prescription Continuous    Liters per minute 2   PRN   Compliance with Home Oxygen Use Yes      Initial 6 min Walk   Oxygen Used None      Program Oxygen Prescription   Program Oxygen Prescription None      Intervention   Short Term Goals To learn and exhibit compliance with exercise, home and travel O2 prescription;To learn and understand importance of monitoring SPO2 with pulse oximeter and demonstrate accurate use of the pulse oximeter.;To learn and understand importance of maintaining oxygen saturations>88%;To learn and demonstrate proper pursed lip breathing techniques or other breathing techniques. ;To learn and demonstrate proper use of respiratory medications    Long  Term Goals Exhibits compliance with exercise, home  and travel O2 prescription;Verbalizes importance of monitoring SPO2 with pulse oximeter and return demonstration;Maintenance of O2 saturations>88%;Exhibits proper breathing techniques, such as pursed lip breathing or other method taught during program session;Compliance with respiratory medication;Demonstrates proper use of MDI's             Oxygen Re-Evaluation:  Oxygen Re-Evaluation     Row Name 10/17/20 1411             Program Oxygen Prescription   Program Oxygen Prescription Continuous       Liters per minute 2               Home Oxygen     Home Oxygen Device Home Concentrator;Portable Concentrator;E-Tanks       Sleep Oxygen Prescription Continuous       Liters per minute 2       Home Exercise Oxygen Prescription Continuous       Liters per minute 2        Home Resting Oxygen Prescription Continuous       Liters per minute 2       Compliance with Home Oxygen Use Yes               Goals/Expected Outcomes  Short Term Goals To learn and understand importance of maintaining oxygen saturations>88%       Long  Term Goals Maintenance of O2 saturations>88%       Comments She does  have a pulse oximeter to check her oxygen saturation at home. Informed her where to get one and explained why it is important to have one. Reviewed that oxygen saturations should be 88 percent and above. Patient has a pulse oximeter at home to check his oxygen.       Goals/Expected Outcomes Short: monitor oxygen at home with exertion. Long: maintain oxygen saturations above 88 percent independently.               Oxygen Discharge (Final Oxygen Re-Evaluation):  Oxygen Re-Evaluation - 10/17/20 1411       Program Oxygen Prescription   Program Oxygen Prescription Continuous    Liters per minute 2      Home Oxygen   Home Oxygen Device Home Concentrator;Portable Concentrator;E-Tanks    Sleep Oxygen Prescription Continuous    Liters per minute 2    Home Exercise Oxygen Prescription Continuous    Liters per minute 2    Home Resting Oxygen Prescription Continuous    Liters per minute 2    Compliance with Home Oxygen Use Yes      Goals/Expected Outcomes   Short Term Goals To learn and understand importance of maintaining oxygen saturations>88%    Long  Term Goals Maintenance of O2 saturations>88%    Comments She does  have a pulse oximeter to check her oxygen saturation at home. Informed her where to get one and explained why it is important to have one. Reviewed that oxygen saturations should be 88 percent and above. Patient has a pulse oximeter at home to check his oxygen.    Goals/Expected Outcomes Short: monitor oxygen at home with exertion. Long: maintain oxygen saturations above 88 percent independently.             Initial Exercise Prescription:   Initial Exercise Prescription - 09/02/20 1100       Date of Initial Exercise RX and Referring Provider   Date 09/02/20    Referring Provider Kloefkorn      Treadmill   MPH 1.5    Grade 0    Minutes 15    METs 2      Recumbant Bike   Level 1    RPM 60    Minutes 15    METs 2      NuStep   Level 2    SPM 80    Minutes 15    METs 2      Recumbant Elliptical   Level 1    RPM 50    Minutes 15    METs 2      REL-XR   Level 2    Watts 50    Minutes 15    METs 2      Prescription Details   Frequency (times per week) 3    Duration Progress to 30 minutes of continuous aerobic without signs/symptoms of physical distress      Intensity   THRR 40-80% of Max Heartrate 98-127    Ratings of Perceived Exertion 11-13      Progression   Progression Continue progressive overload as per policy without signs/symptoms or physical distress.      Resistance Training   Training Prescription Yes    Weight 3 lb    Reps 10-15  Perform Capillary Blood Glucose checks as needed.  Exercise Prescription Changes:   Exercise Prescription Changes     Row Name 09/02/20 1100 09/05/20 1400 09/09/20 1600 09/23/20 1200 10/21/20 1000     Response to Exercise   Blood Pressure (Admit) 142/68 -- 110/64 142/82 124/60   Blood Pressure (Exercise) 164/78 -- 120/70 138/64 142/70   Blood Pressure (Exit) 144/68 -- 122/56 124/72 114/62   Heart Rate (Admit) 69 bpm -- 62 bpm 96 bpm 70 bpm   Heart Rate (Exercise) 111 bpm -- 68 bpm 99 bpm 81 bpm   Heart Rate (Exit) 82 bpm -- 57 bpm 70 bpm 67 bpm   Oxygen Saturation (Admit) -- -- 93 % 92 % 97 %   Oxygen Saturation (Exercise) -- -- 100 % 98 % 91 %   Oxygen Saturation (Exit) -- -- 98 % 95 % 96 %   Rating of Perceived Exertion (Exercise) 12 -- _0 Perceived Dyspnea (Exercise) 1 -- _1 Symptoms none -- SOB SOB SOB   Duration -- -- Continue with 30 min of aerobic exercise without signs/symptoms of physical distress. Continue  with 30 min of aerobic exercise without signs/symptoms of physical distress. Continue with 30 min of aerobic exercise without signs/symptoms of physical distress.   Intensity -- -- THRR unchanged THRR unchanged THRR unchanged     Progression   Progression -- -- Continue to progress workloads to maintain intensity without signs/symptoms of physical distress. Continue to progress workloads to maintain intensity without signs/symptoms of physical distress. Continue to progress workloads to maintain intensity without signs/symptoms of physical distress.   Average METs -- -- 1.91 2.98 2     Resistance Training   Training Prescription -- -- Yes Yes Yes   Weight -- -- 3 lb 3 lb 3 lb   Reps -- -- 10-15 10-15 10-15     Interval Training   Interval Training -- -- No No No     Treadmill   MPH -- -- 1.5 1.5 --   Grade -- -- 0 0 --   Minutes -- -- 15 15 --   METs -- -- 2.15 2.15 --     NuStep   Level -- -- 3 -- 3   SPM -- -- -- -- 80   Minutes -- -- 15 -- 15   METs -- -- 2 -- 2     Recumbant Elliptical   Level -- -- 1 -- --   Minutes -- -- 15 -- --   METs -- -- 1.5 -- --     REL-XR   Level -- -- -- 2 --   Watts -- -- -- 5 --   Minutes -- -- -- 15 --     Biostep-RELP   Level -- -- 2 -- 2   Minutes -- -- 15 -- 15   METs -- -- 2 -- 2     Home Exercise Plan   Plans to continue exercise at -- Home (comment)  has [place] (comment)  has Programmer, applications -- --   Frequency -- Add 3 additional days to program exercise sessions. Add 3 additional days to program exercise sessions. -- --   Initial Home Exercises Provided -- 09/05/20 09/05/20 -- --    Miner Name 11/06/20 0800 11/19/20 1200 12/03/20 1500 12/17/20 0900       Response to Exercise   Blood Pressure (Admit) 122/64 164/74 130/80 128/64    Blood Pressure (Exercise) 130/72 128/82  152/74 138/70    Blood Pressure (Exit) 122/80 132/72 138/80 132/70    Heart Rate (Admit) 68 bpm 100 bpm 79 bpm 71 bpm    Heart Rate (Exercise) 77  bpm 79 bpm 93 bpm 99 bpm    Heart Rate (Exit) 80 bpm 70 bpm 65 bpm 91 bpm    Oxygen Saturation (Admit) 99 % 87 % 79 % 98 %    Oxygen Saturation (Exercise) 94 % 98 % 93 % 93 %    Oxygen Saturation (Exit) 93 % 91 % 65 % 97 %    Rating of Perceived Exertion (Exercise) _0 Perceived Dyspnea (Exercise) _1 Symptoms SOB SOB SOB --    Duration Continue with 30 min of aerobic exercise without signs/symptoms of physical distress. Continue with 30 min of aerobic exercise without signs/symptoms of physical distress. Continue with 30 min of aerobic exercise without signs/symptoms of physical distress. Continue with 30 min of aerobic exercise without signs/symptoms of physical distress.    Intensity THRR unchanged THRR unchanged THRR unchanged THRR unchanged         Progression        Progression Continue to progress workloads to maintain intensity without signs/symptoms of physical distress. Continue to progress workloads to maintain intensity without signs/symptoms of physical distress. Continue to progress workloads to maintain intensity without signs/symptoms of physical distress. Continue to progress workloads to maintain intensity without signs/symptoms of physical distress.    Average METs 3.15 1.5 2.53 2.8         Resistance Training        Training Prescription Yes Yes Yes Yes    Weight 3 lb 3 lb 3 lb 3 lb    Reps 10-15 10-15 10-15 10-15         Interval Training        Interval Training No No No No         Oxygen        Oxygen -- -- Continuous Continuous    Liters -- -- 2 2         Recumbant Bike        Level -- 2 4 --    Minutes -- 15 15 --    METs -- 3.03 -- --         NuStep        Level 2 -- 5 5    Minutes 15 -- 15 15    METs 2.3 -- 1.8 1.8         REL-XR        Level 3 -- 2 --    Minutes 15 -- 15 --    METs 4 -- 4 --         T5 Nustep        Level -- 2 1 --    Minutes -- 15 15 --    METs -- 1.7 1.8 --         Home Exercise Plan        Plans  to continue exercise at Home (comment)  has [place] (comment)  has [place] (comment)  has [place] (comment)  has Program[employer], applications    Frequency Add 3 additional days to program exercise sessions. Add 3 additional days to program exercise sessions. Add 3 additional days to program exercise sessions. Add 3 additional days to program exercise sessions.    Initial Home Exercises  Provided 09/05/20 09/05/20 09/05/20 09/05/20            Exercise Comments:   Exercise Comments     Row Name 09/04/20 1418           Exercise Comments First full day of exercise!  Patient was oriented to gym and equipment including functions, settings, policies, and procedures.  Patient's individual exercise prescription and treatment plan were reviewed.  All starting workloads were established based on the results of the 6 minute walk test done at initial orientation visit.  The plan for exercise progression was also introduced and progression will be customized based on patient's performance and goals.                Exercise Goals and Review:   Exercise Goals     Row Name 09/02/20 1104             Exercise Goals   Increase Physical Activity Yes       Intervention Provide advice, education, support and counseling about physical activity/exercise needs.;Develop an individualized exercise prescription for aerobic and resistive training based on initial evaluation findings, risk stratification, comorbidities and participant's personal goals.       Expected Outcomes Short Term: Attend rehab on a regular basis to increase amount of physical activity.;Long Term: Add in home exercise to make exercise part of routine and to increase amount of physical activity.;Long Term: Exercising regularly at least 3-5 days a week.       Increase Strength and Stamina Yes       Intervention Provide advice, education, support and counseling about physical activity/exercise needs.;Develop an  individualized exercise prescription for aerobic and resistive training based on initial evaluation findings, risk stratification, comorbidities and participant's personal goals.       Expected Outcomes Short Term: Increase workloads from initial exercise prescription for resistance, speed, and METs.;Short Term: Perform resistance training exercises routinely during rehab and add in resistance training at home;Long Term: Improve cardiorespiratory fitness, muscular endurance and strength as measured by increased METs and functional capacity (6MWT)       Able to understand and use rate of perceived exertion (RPE) scale Yes       Intervention Provide education and explanation on how to use RPE scale       Expected Outcomes Short Term: Able to use RPE daily in rehab to express subjective intensity level;Long Term:  Able to use RPE to guide intensity level when exercising independently       Able to understand and use Dyspnea scale Yes       Intervention Provide education and explanation on how to use Dyspnea scale       Expected Outcomes Short Term: Able to use Dyspnea scale daily in rehab to express subjective sense of shortness of breath during exertion;Long Term: Able to use Dyspnea scale to guide intensity level when exercising independently       Knowledge and understanding of Target Heart Rate Range (THRR) Yes       Intervention Provide education and explanation of THRR including how the numbers were predicted and where they are located for reference       Expected Outcomes Short Term: Able to use daily as guideline for intensity in rehab;Short Term: Able to state/look up THRR;Long Term: Able to use THRR to govern intensity when exercising independently       Able to check pulse independently Yes       Intervention Provide education and demonstration on how to  check pulse in carotid and radial arteries.;Review the importance of being able to check your own pulse for safety during independent exercise        Expected Outcomes Short Term: Able to explain why pulse checking is important during independent exercise;Long Term: Able to check pulse independently and accurately       Understanding of Exercise Prescription Yes       Intervention Provide education, explanation, and written materials on patient's individual exercise prescription       Expected Outcomes Short Term: Able to explain program exercise prescription;Long Term: Able to explain home exercise prescription to exercise independently                Exercise Goals Re-Evaluation :  Exercise Goals Re-Evaluation     Row Name 09/04/20 1419 09/05/20 1442 09/09/20 1622 09/23/20 1219 10/08/20 1444     Exercise Goal Re-Evaluation   Exercise Goals Review Increase Physical Activity;Knowledge and understanding of Target Heart Rate Range (THRR);Understanding of Exercise Prescription;Able to understand and use rate of perceived exertion (RPE) scale;Increase Strength and Stamina;Able to understand and use Dyspnea scale;Able to check pulse independently Increase Physical Activity;Increase Strength and Stamina Increase Physical Activity;Increase Strength and Stamina;Understanding of Exercise Prescription Increase Physical Activity;Increase Strength and Stamina --   Comments Reviewed RPE and dyspnea scales, THR and program prescription with pt today.  Pt voiced understanding and was given a copy of goals to take home. Reviewed home exercise with pt today.  Pt plans to use the [place] machine for exercise.  Reviewed THR, pulse, RPE, sign and symptoms, pulse oximetery and when to call 911 or MD.  Also discussed weather considerations and indoor options.  Pt voiced understanding. Yanessa is off to a good start in rehab.  She has completed her first three full days of exercise and already exercising on her own at home.  She is getting in her full 30 min already too!  We will continue to monitor her progress. Judys oxygen and BP have been in good range during  exercise.  Staff will encourage trying 4 lb weights. Out for 3 week in [place]   Expected Outcomes Short: Use RPE daily to regulate intensity. Long: Follow program prescription in THR. Short: monitor HR and oxygen while exercising at home  Long: improve MET level Short: Continue to attend rehab regularly Long: Continue to improve stamina. Short: move up to 4 lb weights Long:  improve overall stamina --    Row Name 10/17/20 1413 10/21/20 1032 11/06/20 0809 11/14/20 1423 11/19/20 1254     Exercise Goal Re-Evaluation   Exercise Goals Review Increase Strength and Stamina;Increase Physical Activity Increase Physical Activity;Increase Strength and Stamina Increase Physical Activity;Increase Strength and Stamina;Understanding of Exercise Prescription Increase Physical Activity;Increase Strength and Stamina Increase Physical Activity;Increase Strength and Stamina   Comments Tyresa was walking and being fairly active when she was out of town. She is ready to get back to exercsie and ready to improve more. Phung's oxygen stays in the 90s with exercise.  She does not quite reach THR range.  Staff will review and encourage her to reach THR range. Jaquala is doing well rehab.  She is up to level 3 on the XR.  We will continue to monitor her progress. Pavielle still uses her teeter at home on days not at rehab.  She can tell her legs are stronger.  Her knee still bothers her some when weight bearing.  She sees orthopedic Dr May 23.  SHe is up to  level 5 on NS Marabeth continues to tolerate exercise well.  Staff will encourage increasing weight to 4 lb for strength work   Expected Outcomes Short:attend [name] routinely. Long: maintain an exercise routine post [name] Short:  reach THR range durign exercise Long:  improve overall stamina Short: Get NuStep back up to level 3  Long: Continue to improve stamina Short: continue to exercise consistently Long:  build overall stamina Short: try 4 lb Long: continue to build stamina    Row  Name 12/03/20 1530 12/04/20 1427 12/17/20 0907         Exercise Goal Re-Evaluation   Exercise Goals Review Increase Physical Activity;Increase Strength and Stamina;Understanding of Exercise Prescription Increase Physical Activity;Increase Strength and Stamina Increase Physical Activity;Increase Strength and Stamina     Comments Elouise is doing well in rehab.  She is up to level 4 on the bike.  We will continue to monitor her progress. Onie saw Dr about her knee yesterday.  They recommended some inserts for her shoes to relieve arthritis pain.  She also has  exercises to do at home Zephaniah is doing well. She attends consistently and works at lower end of HR range.  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     Expected Outcomes Short: Move up T5 NuStep Long: Continue to improve stamina Short: continue PT exercises Long:  get knee stronger Short: maintain consistent exercise Long:  continue to build stamina              Discharge Exercise Prescription (Final Exercise Prescription Changes):  Exercise Prescription Changes - 12/17/20 0900       Response to Exercise   Blood Pressure (Admit) 128/64    Blood Pressure (Exercise) 138/70    Blood Pressure (Exit) 132/70    Heart Rate (Admit) 71 bpm    Heart Rate (Exercise) 99 bpm    Heart Rate (Exit) 91 bpm    Oxygen Saturation (Admit) 98 %    Oxygen Saturation (Exercise) 93 %    Oxygen Saturation (Exit) 97 %    Rating of Perceived Exertion (Exercise) 14    Perceived Dyspnea (Exercise) 1    Duration Continue with 30 min of aerobic exercise without signs/symptoms of physical distress.    Intensity THRR unchanged      Progression   Progression Continue to progress workloads to maintain intensity without signs/symptoms of physical distress.    Average METs 2.8      Resistance Training   Training Prescription Yes    Weight 3 lb    Reps 10-15      Interval Training   Interval Training No      Oxygen   Oxygen Continuous    Liters 2       NuStep   Level 5    Minutes 15    METs 1.8      Home Exercise Plan   Plans to continue exercise at Home (comment)   has Programmer, applications   Frequency Add 3 additional days to program exercise sessions.    Initial Home Exercises Provided 09/05/20             Nutrition:  Target Goals: Understanding of nutrition guidelines, daily intake of sodium <1544m, cholesterol <2029m calories 30% from fat and 7% or less from saturated fats, daily to have 5 or more servings of fruits and vegetables.  Education: All About Nutrition: -Group instruction provided by verbal, written material, interactive activities, discussions, models, and posters to present general guidelines  for heart healthy nutrition including fat, fiber, MyPlate, the role of sodium in heart healthy nutrition, utilization of the nutrition label, and utilization of this knowledge for meal planning. Follow up email sent as well. Written material given at graduation. Flowsheet Row Pulmonary Rehab from 12/11/2020 in [facility] Cardiac and Pulmonary Rehab  Date 11/20/20  Educator [facility]  Instruction Review Code 1- Verbalizes Understanding       Biometrics:  Pre Biometrics - 09/02/20 1105       Pre Biometrics   Height 5' 2.75" (1.594 m)    Weight 176 lb 3.2 oz (79.9 kg)    BMI (Calculated) 31.46              Nutrition Therapy Plan and Nutrition Goals:  Nutrition Therapy & Goals - 09/02/20 1020       Nutrition Therapy   Diet Heart healthy, low Na, pulmonary MNT    Drug/Food Interactions Statins/Certain Fruits    Protein (specify units) 80g    Fiber 25 grams    Whole Grain Foods 3 servings    Saturated Fats 12 max. grams    Fruits and Vegetables 8 servings/day    Sodium 1.5 grams      Personal Nutrition Goals   Nutrition Goal ST: Do not add salt to home cooked meals, eat 6 small meals/day LT: limit eating out to 2-3x/week, do not cook with salt, improve breathing with meals.    Comments She has been getting Hello Fresh. She  is going on a Cruise soon in March. She believes her problem is portion control. B: egg and cheese wrap - dunkin doughnuts or bran flake cereal with blueberries L: She reports eating out a lot for lunch - taco bell - bean burrito supreme and plain taco, [place] restaurants, red lobster (salmon, brussels sprouts). D: leftovers, hello fresh, or fixes a meal herself - avocado oil, green beans, sea salt, turnip greens, spinach, roasted carrots, brussells sprouts, pork chops, salmon cakes. Drinks: coffee (decaf, caramel macchiado creamer) or green tea and lots of water. Uses benecol instead of butter. She reports liking cheese - havarti, [place], swiss, [place]. She reports eating a lot of peanut butter (natural Jiff). Uses almond milk. She reports eating beans with corn sometimes. She is still strugglin with heartburn. Reviewed heart healthy eating and pulmonary freindly eating. Plan to reduce salt at home, eat smaller -more frequent meals to help with breathing, then reduce frequency of eating out.      Intervention Plan   Intervention Prescribe, educate and counsel regarding individualized specific dietary modifications aiming towards targeted core components such as weight, hypertension, lipid management, diabetes, heart failure and other comorbidities.;Nutrition handout(s) given to patient.    Expected Outcomes Short Term Goal: Understand basic principles of dietary content, such as calories, fat, sodium, cholesterol and nutrients.;Short Term Goal: A plan has been developed with personal nutrition goals set during dietitian appointment.;Long Term Goal: Adherence to prescribed nutrition plan.             Nutrition Assessments:  MEDIFICTS Score Key: ?70 Need to make dietary changes  40-70 Heart Healthy Diet ? 40 Therapeutic Level Cholesterol Diet  Flowsheet Row Pulmonary Rehab from 09/02/2020 in [facility] Cardiac and Pulmonary Rehab  Picture Your Plate Total Score on Admission 77      Picture Your  Plate Scores: <09 Unhealthy dietary pattern with much room for improvement. 41-50 Dietary pattern unlikely to meet recommendations for good health and room for improvement. 51-60 More healthful dietary pattern, with some  room for improvement.  >60 Healthy dietary pattern, although there may be some specific behaviors that could be improved.   Nutrition Goals Re-Evaluation:  Nutrition Goals Re-Evaluation     [facility] Name 10/17/20 1416 11/14/20 1426           Goals   Current Weight 181 lb (82.1 kg) --      Nutrition Goal Cut back on sodium --      Comment Lawsyn knows that she needs to cut back on salt. She was out of town on a Cruise for 2 weeks. She feels like her weight is up since she left. She wants to work on getting her weight back down. Informed her to watch her weight and manage her sodium now that she is back from her trip . Kamaiya states her weight is coming back down.  She continues to watch sodium.      Expected Outcome Short: decrease sodium intake. Long: lose more weight and keep a low sodium diet. Short: continue to watch sodium intake Long:  reach goal weight ad keep heart healthy diet               Nutrition Goals Discharge (Final Nutrition Goals Re-Evaluation):  Nutrition Goals Re-Evaluation - 11/14/20 1426       Goals   Comment Chelsie states her weight is coming back down.  She continues to watch sodium.    Expected Outcome Short: continue to watch sodium intake Long:  reach goal weight ad keep heart healthy diet             Psychosocial: Target Goals: Acknowledge presence or absence of significant depression and/or stress, maximize coping skills, provide positive support system. Participant is able to verbalize types and ability to use techniques and skills needed for reducing stress and depression.   Education: Stress, Anxiety, and Depression - Group verbal and visual presentation to define topics covered.  Reviews how body is impacted by stress, anxiety, and  depression.  Also discusses healthy ways to reduce stress and to treat/manage anxiety and depression.  Written material given at graduation. Flowsheet Row Pulmonary Rehab from 12/11/2020 in [facility] Cardiac and Pulmonary Rehab  Date 10/16/20  Educator [facility]  Instruction Review Code 1- [employer] Understanding       Education: Sleep Hygiene -Provides group verbal and written instruction about how sleep can affect your health.  Define sleep hygiene, discuss sleep cycles and impact of sleep habits. Review good sleep hygiene tips.  Flowsheet Row Cardiac Rehab from 05/30/2018 in [facility] Cardiac and Pulmonary Rehab  Date 03/16/18  Educator [facility]  Instruction Review Code 1- Verbalizes Understanding       Initial Review & Psychosocial Screening:  Initial Psych Review & Screening - 08/29/20 0915       Initial Review   Current issues with Current Psychotropic Meds;Current Anxiety/Panic    Comments She sometimes is anxious and is a positive person in general.      Family Dynamics   Good Support System? Yes    Comments Aidynn can look to her husband and her two daughters for support.      Barriers   Psychosocial barriers to participate in program The patient should benefit from training in stress management and relaxation.      Screening Interventions   Interventions To provide support and resources with identified psychosocial needs;Encouraged to exercise;Provide feedback about the scores to participant    Expected Outcomes Short Term goal: Utilizing psychosocial counselor, staff and physician to assist with identification  of specific Stressors or current issues interfering with healing process. Setting desired goal for each stressor or current issue identified.;Long Term Goal: Stressors or current issues are controlled or eliminated.;Short Term goal: Identification and review with participant of any Quality of Life or Depression concerns found by scoring the questionnaire.;Long Term goal: The participant  improves quality of Life and PHQ9 Scores as seen by post scores and/or verbalization of changes             Quality of Life Scores:  Scores of 19 and below usually indicate a poorer quality of life in these areas.  A difference of  2-3 points is a clinically meaningful difference.  A difference of 2-3 points in the total score of the Quality of Life Index has been associated with significant improvement in overall quality of life, self-image, physical symptoms, and general health in studies assessing change in quality of life.  PHQ-9: Recent Review Flowsheet Data     Depression screen [facility] 2/9 09/02/2020 05/30/2018 03/08/2018   Decreased Interest 0 0 1   Down, Depressed, Hopeless 0 0 0   PHQ - 2 Score 0 0 1   Altered sleeping - - 0   Tired, decreased energy _0 Change in appetite 1 0 0   Feeling bad or failure about yourself  0 0 0   Trouble concentrating 0 0 0   Moving slowly or fidgety/restless 0 0 0   Suicidal thoughts 0 0 0   PHQ-9 Score - - 2   Difficult doing work/chores Not difficult at all - Not difficult at all      Interpretation of Total Score  Total Score Depression Severity:  1-4 = Minimal depression, 5-9 = Mild depression, 10-14 = Moderate depression, 15-19 = Moderately severe depression, 20-27 = Severe depression   Psychosocial Evaluation and Intervention:  Psychosocial Evaluation - 08/29/20 0917       Psychosocial Evaluation & Interventions   Interventions Encouraged to exercise with the program and follow exercise prescription;Relaxation education;Stress management education    Comments She sometimes is anxious and is a positive person in general.Teri can look to her husband and her two daughters for support.    Expected Outcomes Short: Exercise regularly to support mental health and notify staff of any changes. Long: maintain mental health and well being through teaching of rehab or prescribed medications independently.    Continue Psychosocial Services   Follow up required by staff             Psychosocial Re-Evaluation:  Psychosocial Re-Evaluation     [place] Name 10/17/20 1423 11/14/20 1428 12/04/20 1432         Psychosocial Re-Evaluation   Current issues with None Identified None Identified Current Stress Concerns     Comments Patient reports no issues with their current mental states, sleep, stress, depression or anxiety. Will follow up with patient in a few weeks for any changes. Eldred reports no changes in stress.  She says her husband likes to travel ad they have trips planned. Chanin still has no stressors and sleeps well.     Expected Outcomes Short: Continue to exercise regularly to support mental health and notify staff of any changes. Long: maintain mental health and well being through teaching of rehab or prescribed medications independently. Short:  continue to exercise to help keep strong Long:  maintain positive outlook Short:  continue to exercise to help keep strong Long:  maintain positive outlook     Interventions  Encouraged to attend Pulmonary Rehabilitation for the exercise -- --     Continue Psychosocial Services  Follow up required by staff -- --              Psychosocial Discharge (Final Psychosocial Re-Evaluation):  Psychosocial Re-Evaluation - 12/04/20 1432       Psychosocial Re-Evaluation   Current issues with Current Stress Concerns    Comments Foy still has no stressors and sleeps well.    Expected Outcomes Short:  continue to exercise to help keep strong Long:  maintain positive outlook             Education: Education Goals: Education classes will be provided on a weekly basis, covering required topics. Participant will state understanding/return demonstration of topics presented.  Learning Barriers/Preferences:  Learning Barriers/Preferences - 08/29/20 0910       Learning Barriers/Preferences   Learning Barriers None    Learning Preferences None             General Pulmonary  Education Topics:  Infection Prevention: - Provides verbal and written material to individual with discussion of infection control including proper hand washing and proper equipment cleaning during exercise session. Flowsheet Row Pulmonary Rehab from 12/11/2020 in [facility] Cardiac and Pulmonary Rehab  Date 09/02/20  Educator As  Instruction Review Code 1- Verbalizes Understanding       Falls Prevention: - Provides verbal and written material to individual with discussion of falls prevention and safety. Flowsheet Row Pulmonary Rehab from 08/29/2020 in [facility] Cardiac and Pulmonary Rehab  Date 08/29/20  Educator [facility]  Instruction Review Code 1- Verbalizes Understanding       Chronic Lung Disease Review: - Group verbal instruction with posters, models, PowerPoint presentations and videos,  to review new updates, new respiratory medications, new advancements in procedures and treatments. Providing information on websites and "800" numbers for continued self-education. Includes information about supplement oxygen, available portable oxygen systems, continuous and intermittent flow rates, oxygen safety, concentrators, and Medicare reimbursement for oxygen. Explanation of Pulmonary Drugs, including class, frequency, complications, importance of spacers, rinsing mouth after steroid MDI's, and proper cleaning methods for nebulizers. Review of basic lung anatomy and physiology related to function, structure, and complications of lung disease. Review of risk factors. Discussion about methods for diagnosing sleep apnea and types of masks and machines for OSA. Includes a review of the use of types of environmental controls: home humidity, furnaces, filters, dust mite/pet prevention, HEPA vacuums. Discussion about weather changes, air quality and the benefits of nasal washing. Instruction on Warning signs, infection symptoms, calling MD promptly, preventive modes, and value of vaccinations. Review of effective airway  clearance, coughing and/or vibration techniques. Emphasizing that all should Create an Action Plan. Written material given at graduation. Flowsheet Row Pulmonary Rehab from 12/11/2020 in [facility] Cardiac and Pulmonary Rehab  Date 12/11/20  Educator [facility]  Instruction Review Code 1- Verbalizes Understanding       AED/CPR: - Group verbal and written instruction with the use of models to demonstrate the basic use of the AED with the basic ABC's of resuscitation.    Anatomy and Cardiac Procedures: - Group verbal and visual presentation and models provide information about basic cardiac anatomy and function. Reviews the testing methods done to diagnose heart disease and the outcomes of the test results. Describes the treatment choices: Medical Management, Angioplasty, or Coronary Bypass Surgery for treating various heart conditions including Myocardial Infarction, Angina, Valve Disease, and Cardiac Arrhythmias.  Written material given at graduation. Flowsheet Row Pulmonary  Rehab from 12/11/2020 in [facility] Cardiac and Pulmonary Rehab  Date 11/06/20  Educator [facility]  Instruction Review Code 1- Verbalizes Understanding       Medication Safety: - Group verbal and visual instruction to review commonly prescribed medications for heart and lung disease. Reviews the medication, class of the drug, and side effects. Includes the steps to properly store meds and maintain the prescription regimen.  Written material given at graduation.   Other: -Provides group and verbal instruction on various topics (see comments)   Knowledge Questionnaire Score:  Knowledge Questionnaire Score - 09/02/20 1109       Knowledge Questionnaire Score   Pre Score 16/18 inhaler              Core Components/Risk Factors/Patient Goals at Admission:  Personal Goals and Risk Factors at Admission - 09/02/20 1114       Core Components/Risk Factors/Patient Goals on Admission    Weight Management Yes;Weight Loss    Intervention  Weight Management: Develop a combined nutrition and exercise program designed to reach desired caloric intake, while maintaining appropriate intake of nutrient and fiber, sodium and fats, and appropriate energy expenditure required for the weight goal.;Weight Management: Provide education and appropriate resources to help participant work on and attain dietary goals.;Weight Management/Obesity: Establish reasonable short term and long term weight goals.    Admit Weight 176 lb 3.2 oz (79.9 kg)    Goal Weight: Short Term 175 lb (79.4 kg)    Goal Weight: Long Term 170 lb (77.1 kg)    Expected Outcomes Short Term: Continue to assess and modify interventions until short term weight is achieved;Long Term: Adherence to nutrition and physical activity/exercise program aimed toward attainment of established weight goal;Weight Loss: Understanding of general recommendations for a balanced deficit meal plan, which promotes 1-2 lb weight loss per week and includes a negative energy balance of [phone] kcal/d;Understanding recommendations for meals to include 15-35% energy as protein, 25-35% energy from fat, 35-60% energy from carbohydrates, less than 274m of dietary cholesterol, 20-35 gm of total fiber daily;Understanding of distribution of calorie intake throughout the day with the consumption of 4-5 meals/snacks    Improve shortness of breath with ADL's Yes    Intervention Provide education, individualized exercise plan and daily activity instruction to help decrease symptoms of SOB with activities of daily living.    Expected Outcomes Short Term: Improve cardiorespiratory fitness to achieve a reduction of symptoms when performing ADLs;Long Term: Be able to perform more ADLs without symptoms or delay the onset of symptoms    Heart Failure Yes    Intervention Provide a combined exercise and nutrition program that is supplemented with education, support and counseling about heart failure. Directed toward relieving  symptoms such as shortness of breath, decreased exercise tolerance, and extremity edema.    Expected Outcomes Improve functional capacity of life;Short term: Attendance in program 2-3 days a week with increased exercise capacity. Reported lower sodium intake. Reported increased fruit and vegetable intake. Reports medication compliance.;Short term: Daily weights obtained and reported for increase. Utilizing diuretic protocols set by physician.;Long term: Adoption of self-care skills and reduction of barriers for early signs and symptoms recognition and intervention leading to self-care maintenance.    Hypertension Yes    Intervention Provide education on lifestyle modifcations including regular physical activity/exercise, weight management, moderate sodium restriction and increased consumption of fresh fruit, vegetables, and low fat dairy, alcohol moderation, and smoking cessation.;Monitor prescription use compliance.    Expected Outcomes Short Term: Continued assessment and  intervention until BP is < 140/56mm HG in hypertensive participants. < 130/44mm HG in hypertensive participants with diabetes, heart failure or chronic kidney disease.;Long Term: Maintenance of blood pressure at goal levels.    Lipids Yes    Intervention Provide education and support for participant on nutrition & aerobic/resistive exercise along with prescribed medications to achieve LDL '70mg'$ , HDL >$Remo'40mg'DMahk$ .    Expected Outcomes Short Term: Participant states understanding of desired cholesterol values and is compliant with medications prescribed. Participant is following exercise prescription and nutrition guidelines.;Long Term: Cholesterol controlled with medications as prescribed, with individualized exercise RX and with personalized nutrition plan. Value goals: LDL < $Rem'70mg'ZMfE$ , HDL > 40 mg.             Education:Diabetes - Individual verbal and written instruction to review signs/symptoms of diabetes, desired ranges of glucose level  fasting, after meals and with exercise. Acknowledge that pre and post exercise glucose checks will be done for 3 sessions at entry of program.   Know Your Numbers and Heart Failure: - Group verbal and visual instruction to discuss disease risk factors for cardiac and pulmonary disease and treatment options.  Reviews associated critical values for Overweight/Obesity, Hypertension, Cholesterol, and Diabetes.  Discusses basics of heart failure: signs/symptoms and treatments.  Introduces Heart Failure Zone chart for action plan for heart failure.  Written material given at graduation. Flowsheet Row Pulmonary Rehab from 12/11/2020 in [facility] Cardiac and Pulmonary Rehab  Date 12/04/20  Educator [facility]  Instruction Review Code 1- Verbalizes Understanding       Core Components/Risk Factors/Patient Goals Review:   Goals and Risk Factor Review     Row Name 10/17/20 1414 11/14/20 1420 12/04/20 1429         Core Components/Risk Factors/Patient Goals Review   Personal Goals Review Improve shortness of breath with ADL's Improve shortness of breath with ADL's;Develop more efficient breathing techniques such as purse lipped breathing and diaphragmatic breathing and practicing self-pacing with activity. Improve shortness of breath with ADL's;Develop more efficient breathing techniques such as purse lipped breathing and diaphragmatic breathing and practicing self-pacing with activity.     Review Spoke to patient about their shortness of breath and what they can do to improve. Patient has been informed of breathing techniques when starting the program. Patient is informed to tell staff if they have had any med changes and that certain meds they are taking or not taking can be causing shortness of breath. Lyndee states her breathing has been better and she rarely needs her oxygen at home.  She is using PLB and a spirometer at home.  They had mold removed from their home and feels that has helped as well. Gentry states her  breathing is still good and she doesnt use oxygen much at home.  She says since she has been exercising she doesnt have to use oxygen as much even going out.  She sees Science writer.     Expected Outcomes Short: Attend [name] regularly to improve shortness of breath with ADL's. Long: maintain independence with ADL's Short: conintue to exercise and practice PL breathing Long: maintian idependence with ADLs Short: follow through with pulmonologist Long:  continue to use PLB as needed              Core Components/Risk Factors/Patient Goals at Discharge (Final Review):   Goals and Risk Factor Review - 12/04/20 1429       Core Components/Risk Factors/Patient Goals Review   Personal Goals Review Improve shortness of breath with ADL's;Develop  more efficient breathing techniques such as purse lipped breathing and diaphragmatic breathing and practicing self-pacing with activity.    Review Leaann states her breathing is still good and she doesnt use oxygen much at home.  She says since she has been exercising she doesnt have to use oxygen as much even going out.  She sees Science writer.    Expected Outcomes Short: follow through with pulmonologist Long:  continue to use PLB as needed             ITP Comments:  ITP Comments     Row Name 08/29/20 0910 09/02/20 1119 09/04/20 0633 09/04/20 1418 10/02/20 0742   ITP Comments Virtual Visit completed. Patient informed on EP and RD appointment and 6 Minute walk test. Patient also informed of patient health questionnaires on My Chart. Patient Verbalizes understanding. Visit diagnosis can be found in [facility]2/09/2020. Completed 6MWT and gym orientation. Initial ITP created and sent for review to Dr. Emily Filbert, Medical Director. 30 Day review completed. Medical Director ITP review done, changes made as directed, and signed approval by Medical Director.  New to program First full day of exercise!  Patient was oriented to gym and equipment  including functions, settings, policies, and procedures.  Patient's individual exercise prescription and treatment plan were reviewed.  All starting workloads were established based on the results of the 6 minute walk test done at initial orientation visit.  The plan for exercise progression was also introduced and progression will be customized based on patient's performance and goals. 30 Day review completed. Medical Director ITP review done, changes made as directed, and signed approval by Medical Director.    Row Name 10/08/20 1444 10/30/20 0956 11/27/20 0655 12/25/20 0741     ITP Comments Out for 3 weeks in [place] 30 Day review completed. Medical Director ITP review done, changes made as directed, and signed approval by Medical Director. 30 Day review completed. Medical Director ITP review done, changes made as directed, and signed approval by Medical Director. 30 Day review completed. Medical Director ITP review done, changes made as directed, and signed approval by Medical Director.             Comments:

## 2020-12-25 NOTE — Progress Notes (Signed)
Daily Session Note  Patient Details  Name: Joyce Evans MRN: [id number] Date of Birth: Mar 19, 1941 Referring Provider:   Flowsheet Row Pulmonary Rehab from 09/02/2020 in [facility] Cardiac and Pulmonary Rehab  Referring Provider Kloefkorn       Encounter Date: 12/25/2020  Check In:  Session Check In - 12/25/20 1407       Check-In   Supervising physician immediately available to respond to emergencies See telemetry face sheet for immediately available ER MD    Location [facility]-Cardiac & Pulmonary Rehab    Staff Present Birdie Sons, MPA, RN;Joseph Lou Miner, MS, ASCM CEP, Exercise Physiologist    Virtual Visit No    Medication changes reported     Yes    Comments prednisone, sulfa abx, and oral rinse all temporarily for oral thrush    Fall or balance concerns reported    No    Tobacco Cessation No Change    Warm-up and Cool-down Performed on first and last piece of equipment    Resistance Training Performed Yes    VAD Patient? No    PAD/SET Patient? No      Pain Assessment   Currently in Pain? No/denies                Social History   Tobacco Use  Smoking Status Never  Smokeless Tobacco Never    Goals Met:  Independence with exercise equipment Exercise tolerated well No report of cardiac concerns or symptoms Strength training completed today  Goals Unmet:  Not Applicable  Comments: Pt able to follow exercise prescription today without complaint.  Will continue to monitor for progression.    Dr. Emily Filbert is Medical Director for [place].  Dr. Ottie Glazier is Medical Director for [facility] Pulmonary Rehabilitation.

## 2020-12-26 ENCOUNTER — Encounter: Payer: Medicare PPO | Admitting: *Deleted

## 2020-12-26 ENCOUNTER — Other Ambulatory Visit: Payer: Self-pay

## 2020-12-26 DIAGNOSIS — J849 Interstitial pulmonary disease, unspecified: Secondary | ICD-10-CM

## 2020-12-26 NOTE — Progress Notes (Signed)
Daily Session Note  Patient Details  Name: Joyce Evans MRN: [id number] Date of Birth: 12/16/1940 Referring Provider:   Flowsheet Row Pulmonary Rehab from 09/02/2020 in [facility] Cardiac and Pulmonary Rehab  Referring Provider Kloefkorn       Encounter Date: 12/26/2020  Check In:  Session Check In - 12/26/20 1404       Check-In   Supervising physician immediately available to respond to emergencies See telemetry face sheet for immediately available ER MD    Location [facility]-Cardiac & Pulmonary Rehab    Staff Present Meredith Craven, RN BSN;Joseph Hood RCP,RRT,BSRT;Melissa Caiola RDN, LDN    Virtual Visit No    Medication changes reported     No    Fall or balance concerns reported    No    Warm-up and Cool-down Performed on first and last piece of equipment    Resistance Training Performed Yes    VAD Patient? No    PAD/SET Patient? No      Pain Assessment   Currently in Pain? No/denies                Social History   Tobacco Use  Smoking Status Never  Smokeless Tobacco Never    Goals Met:  Independence with exercise equipment Exercise tolerated well No report of cardiac concerns or symptoms Strength training completed today  Goals Unmet:  Not Applicable  Comments: Pt able to follow exercise prescription today without complaint.  Will continue to monitor for progression.    Dr. Mark Miller is Medical Director for [facility].  Dr. Fuad Aleskerov is Medical Director for [name] Pulmonary Rehabilitation. 

## 2020-12-30 ENCOUNTER — Other Ambulatory Visit: Payer: Self-pay

## 2020-12-30 ENCOUNTER — Ambulatory Visit: Payer: Medicare PPO | Admitting: Dermatology

## 2020-12-30 VITALS — Ht 62.75 in | Wt 162.0 lb

## 2020-12-30 DIAGNOSIS — L814 Other melanin hyperpigmentation: Secondary | ICD-10-CM

## 2020-12-30 DIAGNOSIS — J849 Interstitial pulmonary disease, unspecified: Secondary | ICD-10-CM

## 2020-12-30 DIAGNOSIS — Z1283 Encounter for screening for malignant neoplasm of skin: Secondary | ICD-10-CM | POA: Diagnosis not present

## 2020-12-30 DIAGNOSIS — L820 Inflamed seborrheic keratosis: Secondary | ICD-10-CM | POA: Diagnosis not present

## 2020-12-30 DIAGNOSIS — L570 Actinic keratosis: Secondary | ICD-10-CM | POA: Diagnosis not present

## 2020-12-30 DIAGNOSIS — Z85820 Personal history of malignant melanoma of skin: Secondary | ICD-10-CM | POA: Diagnosis not present

## 2020-12-30 DIAGNOSIS — Z85828 Personal history of other malignant neoplasm of skin: Secondary | ICD-10-CM | POA: Diagnosis not present

## 2020-12-30 DIAGNOSIS — D1800 Hemangioma unspecified site: Secondary | ICD-10-CM

## 2020-12-30 DIAGNOSIS — L578 Other skin changes due to chronic exposure to nonionizing radiation: Secondary | ICD-10-CM | POA: Diagnosis not present

## 2020-12-30 DIAGNOSIS — L821 Other seborrheic keratosis: Secondary | ICD-10-CM

## 2020-12-30 DIAGNOSIS — D229 Melanocytic nevi, unspecified: Secondary | ICD-10-CM

## 2020-12-30 NOTE — Progress Notes (Signed)
Daily Session Note  Patient Details  Name: Joyce Evans MRN: [id number] Date of Birth: [birth year] Referring Provider:   Flowsheet Row Pulmonary Rehab from 09/02/2020 in [facility] Cardiac and Pulmonary Rehab  Referring Provider Kloefkorn       Encounter Date: 12/30/2020  Check In:  Session Check In - 12/30/20 1424       Check-In   Supervising physician immediately available to respond to emergencies See telemetry face sheet for immediately available ER MD    Location [facility]-Cardiac & Pulmonary Rehab    Staff Present Birdie Sons, MPA, Mauricia Area, BS, ACSM CEP, Exercise Physiologist;Kara Eliezer Bottom, MS, ASCM CEP, Exercise Physiologist    Virtual Visit No    Medication changes reported     No    Fall or balance concerns reported    No    Tobacco Cessation No Change    Warm-up and Cool-down Performed on first and last piece of equipment    Resistance Training Performed Yes    VAD Patient? No    PAD/SET Patient? No      Pain Assessment   Currently in Pain? No/denies                Social History   Tobacco Use  Smoking Status Never  Smokeless Tobacco Never    Goals Met:  Independence with exercise equipment Exercise tolerated well No report of cardiac concerns or symptoms Strength training completed today  Goals Unmet:  Not Applicable  Comments: Pt able to follow exercise prescription today without complaint.  Will continue to monitor for progression.    [place] Name 09/02/20 1056 12/30/20 1425       6 Minute Walk   Phase Initial Discharge    Distance 950 feet 900 feet    Distance % Change -- -5 %    Distance Feet Change -- -50 ft    Walk Time 6 minutes 6 minutes    # of Rest Breaks 0 0    MPH 1.8 1.7    METS 1.95 1.64    RPE 12 13    Perceived Dyspnea  1 1    VO2 Peak 6.82 5.75    Symptoms No No    Resting HR 69 bpm 63 bpm    Resting BP 142/68 122/80    Resting Oxygen Saturation  95 % 98 %    Exercise Oxygen Saturation  during 6  min walk 85 % 88 %    Max Ex. HR 111 bpm 110 bpm    Max Ex. BP 164/78 124/82    2 Minute Post BP 144/68 --         Interval HR      1 Minute HR 90 105    2 Minute HR 81 108    3 Minute HR 105 110    4 Minute HR 109 107    5 Minute HR 111 98    6 Minute HR 103 99    2 Minute Post HR 98 94    Interval Heart Rate? Yes Yes         Interval Oxygen      Interval Oxygen? Yes Yes    Baseline Oxygen Saturation % 95 % 98 %    1 Minute Oxygen Saturation % 90 % 98 %    1 Minute Liters of Oxygen 4 L  pulsed 2 L    2 Minute Oxygen Saturation % 89 % 96 %  2 Minute Liters of Oxygen 4 L  pulsed 2 L    3 Minute Oxygen Saturation % 88 % 96 %    3 Minute Liters of Oxygen 4 L  pulsed 2 L    4 Minute Oxygen Saturation % 87 % 96 %    4 Minute Liters of Oxygen 4 L  pulsed 2 L    5 Minute Oxygen Saturation % 85 % 88 %    5 Minute Liters of Oxygen 4 L  pulsed 2 L    6 Minute Oxygen Saturation % 85 % 92 %    6 Minute Liters of Oxygen 4 L  pulsed 2 L    2 Minute Post Oxygen Saturation % 88 % 98 %    2 Minute Post Liters of Oxygen 4 L  pulsed --              Dr. Emily Filbert is Medical Director for [place].  Dr. Ottie Glazier is Medical Director for [facility] Pulmonary Rehabilitation.

## 2020-12-30 NOTE — Progress Notes (Signed)
Follow-Up Visit   Subjective  Joyce Evans is a 80 y.o. female who presents for the following: Annual Exam (History of Melanoma and SCC - TBSE today). The patient presents for Total-Body Skin Exam (TBSE) for skin cancer screening and mole check.  The following portions of the chart were reviewed this encounter and updated as appropriate:   Tobacco  Allergies  Meds  Problems  Med Hx  Surg Hx  Fam Hx      Review of Systems:  No other skin or systemic complaints except as noted in HPI or Assessment and Plan.  Objective  Well appearing patient in no apparent distress; mood and affect are within normal limits.  A full examination was performed including scalp, head, eyes, ears, nose, lips, neck, chest, axillae, abdomen, back, buttocks, bilateral upper extremities, bilateral lower extremities, hands, feet, fingers, toes, fingernails, and toenails. All findings within normal limits unless otherwise noted below.  Face x 7, right dorsum hand x 1 (8) Erythematous thin papules/macules with gritty scale.   Face Erythematous keratotic or waxy stuck-on papule or plaque.    Assessment & Plan   Lentigines - Scattered tan macules - Due to sun exposure - Benign-appering, observe - Recommend daily broad spectrum sunscreen SPF 30+ to sun-exposed areas, reapply every 2 hours as needed. - Call for any changes  Seborrheic Keratoses - Stuck-on, waxy, tan-brown papules and/or plaques  - Benign-appearing - Discussed benign etiology and prognosis. - Observe - Call for any changes  Melanocytic Nevi - Tan-brown and/or pink-flesh-colored symmetric macules and papules - Benign appearing on exam today - Observation - Call clinic for new or changing moles - Recommend daily use of broad spectrum spf 30+ sunscreen to sun-exposed areas.   Hemangiomas - Red papules - Discussed benign nature - Observe - Call for any changes  Actinic Damage - Chronic condition, secondary to cumulative  UV/sun exposure - diffuse scaly erythematous macules with underlying dyspigmentation - Recommend daily broad spectrum sunscreen SPF 30+ to sun-exposed areas, reapply every 2 hours as needed.  - Staying in the shade or wearing long sleeves, sun glasses (UVA+UVB protection) and wide brim hats (4-inch brim around the entire circumference of the hat) are also recommended for sun protection.  - Call for new or changing lesions.  Skin cancer screening performed today.  Recently diagnosed with Idiopathic Pulmonary Fibrosis. She is being treated with prednisone.   History of Melanoma - No evidence of recurrence today - No lymphadenopathy - Recommend regular full body skin exams - Recommend daily broad spectrum sunscreen SPF 30+ to sun-exposed areas, reapply every 2 hours as needed.  - Call if any new or changing lesions are noted between office visits  History of Squamous Cell Carcinoma of the Skin - No evidence of recurrence today - No lymphadenopathy - Recommend regular full body skin exams - Recommend daily broad spectrum sunscreen SPF 30+ to sun-exposed areas, reapply every 2 hours as needed.  - Call if any new or changing lesions are noted between office visits  AK (actinic keratosis) (8) Face x 7, right dorsum hand x 1  Destruction of lesion - Face x 7, right dorsum hand x 1 Complexity: simple   Destruction method: cryotherapy   Informed consent: discussed and consent obtained   Timeout:  patient name, date of birth, surgical site, and procedure verified Lesion destroyed using liquid nitrogen: Yes   Region frozen until ice ball extended beyond lesion: Yes   Outcome: patient tolerated procedure well with no complications  Post-procedure details: wound care instructions given    Inflamed seborrheic keratosis Face  Destruction of lesion - Face Complexity: simple   Destruction method: cryotherapy   Informed consent: discussed and consent obtained   Timeout:  patient name, date  of birth, surgical site, and procedure verified Lesion destroyed using liquid nitrogen: Yes   Region frozen until ice ball extended beyond lesion: Yes   Outcome: patient tolerated procedure well with no complications   Post-procedure details: wound care instructions given    Skin cancer screening  Return in about 7 months (around 08/01/2021) for TBSE.  I, Ashok Cordia, CMA, am acting as scribe for Sarina Ser, MD .  Documentation: I have reviewed the above documentation for accuracy and completeness, and I agree with the above.  Sarina Ser, MD

## 2020-12-30 NOTE — Patient Instructions (Signed)

## 2021-01-01 ENCOUNTER — Encounter: Payer: Self-pay | Admitting: Dermatology

## 2021-01-02 ENCOUNTER — Other Ambulatory Visit: Payer: Self-pay

## 2021-01-02 ENCOUNTER — Encounter: Payer: Medicare PPO | Admitting: *Deleted

## 2021-01-02 DIAGNOSIS — J849 Interstitial pulmonary disease, unspecified: Secondary | ICD-10-CM

## 2021-01-02 NOTE — Progress Notes (Signed)
Daily Session Note  Patient Details  Name: Joyce Evans MRN: [id number] Date of Birth: 11-22-1940 Referring Provider:   Flowsheet Row Pulmonary Rehab from 09/02/2020 in [facility] Cardiac and Pulmonary Rehab  Referring Provider Kloefkorn       Encounter Date: 01/02/2021  Check In:  Session Check In - 01/02/21 1402       Check-In   Supervising physician immediately available to respond to emergencies See telemetry face sheet for immediately available ER MD    Location [facility]-Cardiac & Pulmonary Rehab    Staff Present Renita Papa, RN BSN;Joseph [address] [place], [place], RCEP, CCRP, CCET    Virtual Visit No    Medication changes reported     Yes    Comments decreased amloodipine from 5 to 2.42m    Fall or balance concerns reported    No    Warm-up and Cool-down Performed on first and last piece of equipment    Resistance Training Performed Yes    VAD Patient? No    PAD/SET Patient? No      Pain Assessment   Currently in Pain? No/denies                Social History   Tobacco Use  Smoking Status Never  Smokeless Tobacco Never    Goals Met:  Independence with exercise equipment Exercise tolerated well No report of cardiac concerns or symptoms Strength training completed today  Goals Unmet:  Not Applicable  Comments: Pt able to follow exercise prescription today without complaint.  Will continue to monitor for progression.    Dr. MEmily Filbertis Medical Director for [place]  Dr. FOttie Glazieris Medical Director [facility].

## 2021-01-03 ENCOUNTER — Encounter: Payer: Medicare PPO | Admitting: *Deleted

## 2021-01-03 DIAGNOSIS — J849 Interstitial pulmonary disease, unspecified: Secondary | ICD-10-CM

## 2021-01-03 NOTE — Progress Notes (Signed)
Daily Session Note  Patient Details  Name: Joyce Evans MRN: [id number] Date of Birth: [birth year] Referring Provider:   Flowsheet Row Pulmonary Rehab from 09/02/2020 in [facility] Cardiac and Pulmonary Rehab  Referring Provider Kloefkorn       Encounter Date: 01/03/2021  Check In:  Session Check In - 01/03/21 1126       Check-In   Supervising physician immediately available to respond to emergencies See telemetry face sheet for immediately available ER MD    Location [facility]-Cardiac & Pulmonary Rehab    Staff Present Renita Papa, RN BSN;Joseph [address] [place], [place], [place], CCRP, CCET    Virtual Visit No    Medication changes reported     No    Fall or balance concerns reported    No    Warm-up and Cool-down Performed on first and last piece of equipment    Resistance Training Performed Yes    VAD Patient? No    PAD/SET Patient? No      Pain Assessment   Currently in Pain? No/denies                Social History   Tobacco Use  Smoking Status Never  Smokeless Tobacco Never    Goals Met:  Independence with exercise equipment Exercise tolerated well No report of cardiac concerns or symptoms Strength training completed today  Goals Unmet:  Not Applicable  Comments: Pt able to follow exercise prescription today without complaint.  Will continue to monitor for progression.    Dr. Emily Filbert is Medical Director for [place].  Dr. Ottie Glazier is Medical Director for [facility] Pulmonary Rehabilitation.

## 2021-01-06 ENCOUNTER — Other Ambulatory Visit: Payer: Self-pay

## 2021-01-06 DIAGNOSIS — J849 Interstitial pulmonary disease, unspecified: Secondary | ICD-10-CM

## 2021-01-06 NOTE — Progress Notes (Signed)
Daily Session Note  Patient Details  Name: Joyce Evans MRN: [id number] Date of Birth: Aug 22, 1940 Referring Provider:   Flowsheet Row Pulmonary Rehab from 09/02/2020 in [facility] Cardiac and Pulmonary Rehab  Referring Provider Kloefkorn       Encounter Date: 01/06/2021  Check In:  Session Check In - 01/06/21 1412       Check-In   Supervising physician immediately available to respond to emergencies See telemetry face sheet for immediately available ER MD    Location [facility]-Cardiac & Pulmonary Rehab    Staff Present Birdie Sons, MPA, Mauricia Area, BS, ACSM CEP, Exercise Physiologist;Kara Eliezer Bottom, MS, ASCM CEP, Exercise Physiologist    Virtual Visit No    Medication changes reported     No    Fall or balance concerns reported    No    Tobacco Cessation No Change    Warm-up and Cool-down Performed on first and last piece of equipment    Resistance Training Performed Yes    VAD Patient? No    PAD/SET Patient? No      Pain Assessment   Currently in Pain? No/denies                Social History   Tobacco Use  Smoking Status Never  Smokeless Tobacco Never    Goals Met:  Independence with exercise equipment Exercise tolerated well No report of cardiac concerns or symptoms Strength training completed today  Goals Unmet:  Not Applicable  Comments: Pt able to follow exercise prescription today without complaint.  Will continue to monitor for progression.    Dr. Emily Filbert is Medical Director for [place].  Dr. Ottie Glazier is Medical Director for [facility] Pulmonary Rehabilitation.

## 2021-01-07 NOTE — Patient Instructions (Signed)
Discharge Patient Instructions  Patient Details  Name: Joyce Evans MRN: [id number] Date of Birth: Mar 03, 1941 Referring Provider:  Kloefkorn, [place], MD  Smoking History:  Social History   Tobacco Use  Smoking Status Never  Smokeless Tobacco Never    Diagnosis:  ILD (interstitial lung disease) ([place])  Initial Exercise Prescription:  Initial Exercise Prescription - 09/02/20 1100       Date of Initial Exercise RX and Referring Provider   Date 09/02/20    Referring Provider Kloefkorn      Treadmill   MPH 1.5    Grade 0    Minutes 15    METs 2      Recumbant Bike   Level 1    RPM 60    Minutes 15    METs 2      NuStep   Level 2    SPM 80    Minutes 15    METs 2      Recumbant Elliptical   Level 1    RPM 50    Minutes 15    METs 2      REL-XR   Level 2    Watts 50    Minutes 15    METs 2      Prescription Details   Frequency (times per week) 3    Duration Progress to 30 minutes of continuous aerobic without signs/symptoms of physical distress      Intensity   THRR 40-80% of Max Heartrate 98-127    Ratings of Perceived Exertion 11-13      Progression   Progression Continue progressive overload as per policy without signs/symptoms or physical distress.      Resistance Training   Training Prescription Yes    Weight 3 lb    Reps 10-15             Discharge Exercise Prescription (Final Exercise Prescription Changes):  Exercise Prescription Changes - 12/31/20 1500       Response to Exercise   Blood Pressure (Admit) 122/80    Blood Pressure (Exercise) 124/82    Blood Pressure (Exit) 94/62    Heart Rate (Admit) 63 bpm    Heart Rate (Exercise) 98 bpm    Heart Rate (Exit) 92 bpm    Oxygen Saturation (Admit) 98 %    Oxygen Saturation (Exercise) 96 %    Oxygen Saturation (Exit) 96 %    Rating of Perceived Exertion (Exercise) 14    Perceived Dyspnea (Exercise) 1    Symptoms SOB    Duration Continue with 30 min of aerobic exercise without  signs/symptoms of physical distress.    Intensity THRR unchanged      Progression   Progression Continue to progress workloads to maintain intensity without signs/symptoms of physical distress.    Average METs 3.25      Resistance Training   Training Prescription Yes    Weight 3 lb    Reps 10-15      Interval Training   Interval Training No      Oxygen   Oxygen Continuous    Liters 2      Treadmill   MPH 1.5    Grade 0    Minutes 15    METs 2.15      NuStep   Level 5    Minutes 15    METs 2.7      REL-XR   Level 4    Minutes 15    METs 4.9  Home Exercise Plan   Plans to continue exercise at Home (comment)   has Programmer, applications   Frequency Add 3 additional days to program exercise sessions.    Initial Home Exercises Provided 09/05/20             Functional Capacity:  6 Minute Walk     Row Name 09/02/20 1056 12/30/20 1425       6 Minute Walk   Phase Initial Discharge    Distance 950 feet 900 feet    Distance % Change -- -5 %    Distance Feet Change -- -50 ft    Walk Time 6 minutes 6 minutes    # of Rest Breaks 0 0    MPH 1.8 1.7    METS 1.95 1.64    RPE 12 13    Perceived Dyspnea  1 1    VO2 Peak 6.82 5.75    Symptoms No No    Resting HR 69 bpm 63 bpm    Resting BP 142/68 122/80    Resting Oxygen Saturation  95 % 98 %    Exercise Oxygen Saturation  during 6 min walk 85 % 88 %    Max Ex. HR 111 bpm 110 bpm    Max Ex. BP 164/78 124/82    2 Minute Post BP 144/68 --         Interval HR      1 Minute HR 90 105    2 Minute HR 81 108    3 Minute HR 105 110    4 Minute HR 109 107    5 Minute HR 111 98    6 Minute HR 103 99    2 Minute Post HR 98 94    Interval Heart Rate? Yes Yes         Interval Oxygen      Interval Oxygen? Yes Yes    Baseline Oxygen Saturation % 95 % 98 %    1 Minute Oxygen Saturation % 90 % 98 %    1 Minute Liters of Oxygen 4 L  pulsed 2 L    2 Minute Oxygen Saturation % 89 % 96 %    2 Minute Liters of Oxygen 4 L   pulsed 2 L    3 Minute Oxygen Saturation % 88 % 96 %    3 Minute Liters of Oxygen 4 L  pulsed 2 L    4 Minute Oxygen Saturation % 87 % 96 %    4 Minute Liters of Oxygen 4 L  pulsed 2 L    5 Minute Oxygen Saturation % 85 % 88 %    5 Minute Liters of Oxygen 4 L  pulsed 2 L    6 Minute Oxygen Saturation % 85 % 92 %    6 Minute Liters of Oxygen 4 L  pulsed 2 L    2 Minute Post Oxygen Saturation % 88 % 98 %    2 Minute Post Liters of Oxygen 4 L  pulsed --           Nutrition & Weight - Outcomes:  Pre Biometrics - 09/02/20 1105       Pre Biometrics   Height 5' 2.75" (1.594 m)    Weight 176 lb 3.2 oz (79.9 kg)    BMI (Calculated) 31.46             Post Biometrics - 12/30/20 1425        Post  Biometrics  Height 5' 2.75" (1.594 m)    Weight 162 lb (73.5 kg)    BMI (Calculated) 28.92    Single Leg Stand 4.28 seconds             Nutrition:  Nutrition Therapy & Goals - 09/02/20 1020       Nutrition Therapy   Diet Heart healthy, low Na, pulmonary MNT    Drug/Food Interactions Statins/Certain Fruits    Protein (specify units) 80g    Fiber 25 grams    Whole Grain Foods 3 servings    Saturated Fats 12 max. grams    Fruits and Vegetables 8 servings/day    Sodium 1.5 grams      Personal Nutrition Goals   Nutrition Goal ST: Do not add salt to home cooked meals, eat 6 small meals/day LT: limit eating out to 2-3x/week, do not cook with salt, improve breathing with meals.    Comments She has been getting Hello Fresh. She is going on a Cruise soon in March. She believes her problem is portion control. B: egg and cheese wrap - dunkin doughnuts or bran flake cereal with blueberries L: She reports eating out a lot for lunch - taco bell - bean burrito supreme and plain taco, [place] restaurants, red lobster (salmon, brussels sprouts). D: leftovers, hello fresh, or fixes a meal herself - avocado oil, green beans, sea salt, turnip greens, spinach, roasted carrots, brussells sprouts,  pork chops, salmon cakes. Drinks: coffee (decaf, caramel macchiado creamer) or green tea and lots of water. Uses benecol instead of butter. She reports liking cheese - havarti, munster, swiss, [place]. She reports eating a lot of peanut butter (natural Jiff). Uses almond milk. She reports eating beans with corn sometimes. She is still strugglin with heartburn. Reviewed heart healthy eating and pulmonary freindly eating. Plan to reduce salt at home, eat smaller -more frequent meals to help with breathing, then reduce frequency of eating out.      Intervention Plan   Intervention Prescribe, educate and counsel regarding individualized specific dietary modifications aiming towards targeted core components such as weight, hypertension, lipid management, diabetes, heart failure and other comorbidities.;Nutrition handout(s) given to patient.    Expected Outcomes Short Term Goal: Understand basic principles of dietary content, such as calories, fat, sodium, cholesterol and nutrients.;Short Term Goal: A plan has been developed with personal nutrition goals set during dietitian appointment.;Long Term Goal: Adherence to prescribed nutrition plan.             Goals reviewed with patient; copy given to patient.

## 2021-01-08 ENCOUNTER — Other Ambulatory Visit: Payer: Self-pay

## 2021-01-08 DIAGNOSIS — J849 Interstitial pulmonary disease, unspecified: Secondary | ICD-10-CM | POA: Diagnosis not present

## 2021-01-08 NOTE — Progress Notes (Signed)
Daily Session Note  Patient Details  Name: Joyce Evans MRN: [id number] Date of Birth: 1941/02/13 Referring Provider:   Flowsheet Row Pulmonary Rehab from 09/02/2020 in [facility] Cardiac and Pulmonary Rehab  Referring Provider Kloefkorn       Encounter Date: 01/08/2021  Check In:  Session Check In - 01/08/21 [place]       Check-In   Supervising physician immediately available to respond to emergencies See telemetry face sheet for immediately available ER MD    Location [facility]-Cardiac & Pulmonary Rehab    Staff Present Birdie Sons, MPA, Nino Glow, MS, ASCM CEP, Exercise Physiologist;Joseph Tessie Fass, RCP,RRT,BSRT;Kristen [place], RN,BC,MSN    Virtual Visit No    Medication changes reported     No    Fall or balance concerns reported    No    Tobacco Cessation No Change    Warm-up and Cool-down Performed on first and last piece of equipment    Resistance Training Performed Yes    VAD Patient? No    PAD/SET Patient? No      Pain Assessment   Currently in Pain? No/denies                Social History   Tobacco Use  Smoking Status Never  Smokeless Tobacco Never    Goals Met:  Independence with exercise equipment Exercise tolerated well No report of cardiac concerns or symptoms Strength training completed today  Goals Unmet:  Not Applicable  Comments: Pt able to follow exercise prescription today without complaint.  Will continue to monitor for progression.    Dr. Emily Filbert is Medical Director for [facility].  Dr. Ottie Glazier is Medical Director for [facility] Pulmonary Rehabilitation.

## 2021-01-09 ENCOUNTER — Other Ambulatory Visit: Payer: Self-pay

## 2021-01-09 ENCOUNTER — Encounter: Payer: Medicare PPO | Admitting: *Deleted

## 2021-01-09 DIAGNOSIS — J849 Interstitial pulmonary disease, unspecified: Secondary | ICD-10-CM | POA: Diagnosis not present

## 2021-01-09 NOTE — Progress Notes (Signed)
Discharge Progress Report  Patient Details  Name: Joyce Evans MRN: [id number] Date of Birth: August 24, 1940 Referring Provider:   Flowsheet Row Pulmonary Rehab from 09/02/2020 in [facility] Cardiac and Pulmonary Rehab  Referring Provider Kloefkorn        Number of Visits: 17  Reason for Discharge:  Patient reached a stable level of exercise. Patient independent in their exercise. Patient has met program and personal goals.  Smoking History:  Social History   Tobacco Use  Smoking Status Never  Smokeless Tobacco Never    Diagnosis:  ILD (interstitial lung disease) ([place])  ADL UCSD:  Pulmonary Assessment Scores     Row Name 09/02/20 1106 12/30/20 1501 01/02/21 1411     ADL UCSD   ADL Phase -- Exit Exit   SOB Score total 48 -- 15   Rest 1 -- 0   Walk 2 -- 1   Stairs 4 -- 3   Bath 0 -- 0   Dress 1 -- 0   Shop 3 -- 1     CAT Score   CAT Score 18 -- 7     mMRC Score   mMRC Score 2 2 --            Initial Exercise Prescription:  Initial Exercise Prescription - 09/02/20 1100       Date of Initial Exercise RX and Referring Provider   Date 09/02/20    Referring Provider Kloefkorn      Treadmill   MPH 1.5    Grade 0    Minutes 15    METs 2      Recumbant Bike   Level 1    RPM 60    Minutes 15    METs 2      NuStep   Level 2    SPM 80    Minutes 15    METs 2      Recumbant Elliptical   Level 1    RPM 50    Minutes 15    METs 2      REL-XR   Level 2    Watts 50    Minutes 15    METs 2      Prescription Details   Frequency (times per week) 3    Duration Progress to 30 minutes of continuous aerobic without signs/symptoms of physical distress      Intensity   THRR 40-80% of Max Heartrate 98-127    Ratings of Perceived Exertion 11-13      Progression   Progression Continue progressive overload as per policy without signs/symptoms or physical distress.      Resistance Training   Training Prescription Yes    Weight 3 lb    Reps 10-15              Discharge Exercise Prescription (Final Exercise Prescription Changes):  Exercise Prescription Changes - 12/31/20 1500       Response to Exercise   Blood Pressure (Admit) 122/80    Blood Pressure (Exercise) 124/82    Blood Pressure (Exit) 94/62    Heart Rate (Admit) 63 bpm    Heart Rate (Exercise) 98 bpm    Heart Rate (Exit) 92 bpm    Oxygen Saturation (Admit) 98 %    Oxygen Saturation (Exercise) 96 %    Oxygen Saturation (Exit) 96 %    Rating of Perceived Exertion (Exercise) 14    Perceived Dyspnea (Exercise) 1    Symptoms SOB    Duration Continue with  30 min of aerobic exercise without signs/symptoms of physical distress.    Intensity THRR unchanged      Progression   Progression Continue to progress workloads to maintain intensity without signs/symptoms of physical distress.    Average METs 3.25      Resistance Training   Training Prescription Yes    Weight 3 lb    Reps 10-15      Interval Training   Interval Training No      Oxygen   Oxygen Continuous    Liters 2      Treadmill   MPH 1.5    Grade 0    Minutes 15    METs 2.15      NuStep   Level 5    Minutes 15    METs 2.7      REL-XR   Level 4    Minutes 15    METs 4.9      Home Exercise Plan   Plans to continue exercise at Home (comment)   has Programmer, applications   Frequency Add 3 additional days to program exercise sessions.    Initial Home Exercises Provided 09/05/20             Functional Capacity:  6 Minute Walk     Row Name 09/02/20 1056 12/30/20 1425       6 Minute Walk   Phase Initial Discharge    Distance 950 feet 900 feet    Distance % Change -- -5 %    Distance Feet Change -- -50 ft    Walk Time 6 minutes 6 minutes    # of Rest Breaks 0 0    MPH 1.8 1.7    METS 1.95 1.64    RPE 12 13    Perceived Dyspnea  1 1    VO2 Peak 6.82 5.75    Symptoms No No    Resting HR 69 bpm 63 bpm    Resting BP 142/68 122/80    Resting Oxygen Saturation  95 % 98 %    Exercise  Oxygen Saturation  during 6 min walk 85 % 88 %    Max Ex. HR 111 bpm 110 bpm    Max Ex. BP 164/78 124/82    2 Minute Post BP 144/68 --         Interval HR      1 Minute HR 90 105    2 Minute HR 81 108    3 Minute HR 105 110    4 Minute HR 109 107    5 Minute HR 111 98    6 Minute HR 103 99    2 Minute Post HR 98 94    Interval Heart Rate? Yes Yes         Interval Oxygen      Interval Oxygen? Yes Yes    Baseline Oxygen Saturation % 95 % 98 %    1 Minute Oxygen Saturation % 90 % 98 %    1 Minute Liters of Oxygen 4 L  pulsed 2 L    2 Minute Oxygen Saturation % 89 % 96 %    2 Minute Liters of Oxygen 4 L  pulsed 2 L    3 Minute Oxygen Saturation % 88 % 96 %    3 Minute Liters of Oxygen 4 L  pulsed 2 L    4 Minute Oxygen Saturation % 87 % 96 %    4 Minute Liters of Oxygen 4  L  pulsed 2 L    5 Minute Oxygen Saturation % 85 % 88 %    5 Minute Liters of Oxygen 4 L  pulsed 2 L    6 Minute Oxygen Saturation % 85 % 92 %    6 Minute Liters of Oxygen 4 L  pulsed 2 L    2 Minute Post Oxygen Saturation % 88 % 98 %    2 Minute Post Liters of Oxygen 4 L  pulsed --            Psychological, QOL, Others - Outcomes: PHQ 2/9: Depression screen [facility] 2/9 01/02/2021 09/02/2020 05/30/2018 03/08/2018  Decreased Interest 0 0 0 1  Down, Depressed, Hopeless 0 0 0 0  PHQ - 2 Score 0 0 0 1  Altered sleeping 1 - - 0  Tired, decreased energy _0 Change in appetite 0 1 0 0  Feeling bad or failure about yourself  0 0 0 0  Trouble concentrating 0 0 0 0  Moving slowly or fidgety/restless 0 0 0 0  Suicidal thoughts 0 0 0 0  PHQ-9 Score 2 - - 2  Difficult doing work/chores Not difficult at all Not difficult at all - Not difficult at all    Quality of Life:   Nutrition & Weight - Outcomes:  Pre Biometrics - 09/02/20 1105       Pre Biometrics   Height 5' 2.75" (1.594 m)    Weight 176 lb 3.2 oz (79.9 kg)    BMI (Calculated) 31.46             Post Biometrics - 12/30/20 1425         Post  Biometrics   Height 5' 2.75" (1.594 m)    Weight 162 lb (73.5 kg)    BMI (Calculated) 28.92    Single Leg Stand 4.28 seconds             Nutrition:  Nutrition Therapy & Goals - 09/02/20 1020       Nutrition Therapy   Diet Heart healthy, low Na, pulmonary MNT    Drug/Food Interactions Statins/Certain Fruits    Protein (specify units) 80g    Fiber 25 grams    Whole Grain Foods 3 servings    Saturated Fats 12 max. grams    Fruits and Vegetables 8 servings/day    Sodium 1.5 grams      Personal Nutrition Goals   Nutrition Goal ST: Do not add salt to home cooked meals, eat 6 small meals/day LT: limit eating out to 2-3x/week, do not cook with salt, improve breathing with meals.    Comments She has been getting Hello Fresh. She is going on a Cruise soon in March. She believes her problem is portion control. B: egg and cheese wrap - dunkin doughnuts or bran flake cereal with blueberries L: She reports eating out a lot for lunch - taco bell - bean burrito supreme and plain taco, [place] restaurants, red lobster (salmon, brussels sprouts). D: leftovers, hello fresh, or fixes a meal herself - avocado oil, green beans, sea salt, turnip greens, spinach, roasted carrots, brussells sprouts, pork chops, salmon cakes. Drinks: coffee (decaf, caramel macchiado creamer) or green tea and lots of water. Uses benecol instead of butter. She reports liking cheese - havarti, [place], swiss, [place]. She reports eating a lot of peanut butter (natural Jiff). Uses almond milk. She reports eating beans with corn sometimes. She is still strugglin with heartburn. Reviewed heart healthy  eating and pulmonary freindly eating. Plan to reduce salt at home, eat smaller -more frequent meals to help with breathing, then reduce frequency of eating out.      Intervention Plan   Intervention Prescribe, educate and counsel regarding individualized specific dietary modifications aiming towards targeted core components such as  weight, hypertension, lipid management, diabetes, heart failure and other comorbidities.;Nutrition handout(s) given to patient.    Expected Outcomes Short Term Goal: Understand basic principles of dietary content, such as calories, fat, sodium, cholesterol and nutrients.;Short Term Goal: A plan has been developed with personal nutrition goals set during dietitian appointment.;Long Term Goal: Adherence to prescribed nutrition plan.             Nutrition Discharge:   Education Questionnaire Score:  Knowledge Questionnaire Score - 01/02/21 1409       Knowledge Questionnaire Score   Post Score 17/18             Goals reviewed with patient; copy given to patient.

## 2021-01-09 NOTE — Progress Notes (Signed)
Daily Session Note  Patient Details  Name: Joyce Evans MRN: [id number] Date of Birth: 10/14/1940 Referring Provider:   Flowsheet Row Pulmonary Rehab from 09/02/2020 in [facility] Cardiac and Pulmonary Rehab  Referring Provider Kloefkorn       Encounter Date: 01/09/2021  Check In:  Session Check In - 01/09/21 1408       Check-In   Supervising physician immediately available to respond to emergencies See telemetry face sheet for immediately available ER MD    Location [facility]-Cardiac & Pulmonary Rehab    Staff Present Joyce Craven, RN BSN;Joyce Evans, RCP,RRT,BSRT;Joyce Hawkins, MA, RCEP, CCRP, CCET    Virtual Visit No    Medication changes reported     No    Fall or balance concerns reported    No    Tobacco Cessation No Change    Warm-up and Cool-down Performed on first and last piece of equipment    Resistance Training Performed Yes    VAD Patient? No    PAD/SET Patient? No      Pain Assessment   Currently in Pain? No/denies                Social History   Tobacco Use  Smoking Status Never  Smokeless Tobacco Never    Goals Met:  Independence with exercise equipment Exercise tolerated well No report of cardiac concerns or symptoms Strength training completed today  Goals Unmet:  Not Applicable  Comments:  Joyce Evans graduated today from  rehab with 36 sessions completed.  Details of the patient's exercise prescription and what She needs to do in order to continue the prescription and progress were discussed with patient.  Patient was given a copy of prescription and goals.  Patient verbalized understanding.  Joyce Evans plans to continue to exercise by using her home teeter machine.    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  Joyce Evans is Medical Director for [name]. 

## 2021-01-09 NOTE — Progress Notes (Signed)
Pulmonary Individual Treatment Plan  Patient Details  Name: Joyce Evans MRN: [id number] Date of Birth: 08-12-1940 Referring Provider:   Flowsheet Row Pulmonary Rehab from 09/02/2020 in [facility] Cardiac and Pulmonary Rehab  Referring Provider Kloefkorn       Initial Encounter Date:  Flowsheet Row Pulmonary Rehab from 09/02/2020 in [facility] Cardiac and Pulmonary Rehab  Date 09/02/20       Visit Diagnosis: ILD (interstitial lung disease) ([place])  Patient's Home Medications on Admission:  Current Outpatient Medications:    acetaminophen (TYLENOL) 500 MG tablet, Take 500 mg by mouth every 6 (six) hours as needed., Disp: , Rfl:    amLODipine (NORVASC) 5 MG tablet, Take 5 mg by mouth daily., Disp: , Rfl:    apixaban (ELIQUIS) 5 MG TABS tablet, Take 5 mg by mouth 2 (two) times daily. , Disp: , Rfl:    atorvastatin (LIPITOR) 40 MG tablet, Take 1 tablet (40 mg total) by mouth daily., Disp: 30 tablet, Rfl: 3   atorvastatin (LIPITOR) 40 MG tablet, Take by mouth., Disp: , Rfl:    azelastine (ASTELIN) 0.1 % nasal spray, Place 2 sprays into both nostrils 2 (two) times daily. (Patient not taking: Reported on 08/29/2020), Disp: , Rfl:    brimonidine (ALPHAGAN P) 0.1 % SOLN, Place 1 drop into the right eye 2 (two) times daily., Disp: , Rfl:    Brinzolamide-Brimonidine 1-0.2 % SUSP, Place 1 drop into the left eye 2 (two) times daily., Disp: , Rfl:    cetirizine (ZYRTEC) 10 MG tablet, Take 10 mg by mouth daily. (Patient not taking: Reported on 08/29/2020), Disp: , Rfl:    Cholecalciferol (VITAMIN D3) 3000 units TABS, Take 3,000-6,000 Units by mouth 2 (two) times daily. , Disp: , Rfl:    Cholecalciferol 50 MCG (2000 UT) CAPS, Take by mouth., Disp: , Rfl:    fluticasone (FLONASE) 50 MCG/ACT nasal spray, Place 2 sprays into the nose daily., Disp: , Rfl:    furosemide (LASIX) 20 MG tablet, Take 20 mg by mouth daily., Disp: , Rfl:    furosemide (LASIX) 20 MG tablet, Take by mouth. (Patient not taking: Reported on  08/29/2020), Disp: , Rfl:    losartan (COZAAR) 100 MG tablet, Take 100 mg by mouth daily., Disp: , Rfl:    multivitamin-iron-minerals-folic acid (CENTRUM) chewable tablet, Chew 1 tablet by mouth daily., Disp: , Rfl:    omeprazole (PRILOSEC) 20 MG capsule, Take 20 mg by mouth 2 (two) times daily. Reported on 08/28/2015 (Patient not taking: Reported on 08/29/2020), Disp: , Rfl:    omeprazole (PRILOSEC) 40 MG capsule, Take by mouth., Disp: , Rfl:    Probiotic Product (PROBIOTIC DAILY PO), Take by mouth daily., Disp: , Rfl:    spironolactone (ALDACTONE) 25 MG tablet, Take 12.5 mg by mouth daily. (Patient not taking: Reported on 08/29/2020), Disp: , Rfl:    timolol (BETIMOL) 0.5 % ophthalmic solution, Place 1 drop into both eyes daily., Disp: , Rfl:    Zinc Citrate-Phytase (ZYTAZE) 25-500 MG CAPS, Take by mouth., Disp: , Rfl:   Past Medical History: Past Medical History:  Diagnosis Date   Arthritis    osteo - shoulders, fingers   Atrial fibrillation ([place])    Dizziness    thinks because of diuretic   Family history of adverse reaction to anesthesia    daughter -PONV   Gallstones 2016, 2017   GERD (gastroesophageal reflux disease)    Glaucoma    Heart murmur    history of   Hypertension  Melanoma ([place]) 04/20/2016   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 Excised: 05/19/2016, margins free.   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   Tricuspid valve disorder    leak    Tobacco Use: Social History   Tobacco Use  Smoking Status Never  Smokeless Tobacco Never    Labs: Recent Review Flowsheet Data     Labs for ITP Cardiac and Pulmonary Rehab Latest Ref Rng & Units 06/17/2020 06/18/2020   Cholestrol 0 - 200 mg/dL - 184   LDLCALC 0 - 99 mg/dL - 122(H)   LDLDIRECT 0 - 99 mg/dL 133.5(H) -   HDL >40 mg/dL - 51   Trlycerides <150 mg/dL - 57    Hemoglobin A1c 4.8 - 5.6 % 5.4 -        Pulmonary Assessment Scores:  Pulmonary Assessment Scores     Row Name 09/02/20 1106 12/30/20 1501 01/02/21 1411     ADL UCSD   ADL Phase -- Exit Exit   SOB Score total 48 -- 15   Rest 1 -- 0   Walk 2 -- 1   Stairs 4 -- 3   Bath 0 -- 0   Dress 1 -- 0   Shop 3 -- 1     CAT Score   CAT Score 18 -- 7     mMRC Score   mMRC Score 2 2 --            UCSD: Self-administered rating of dyspnea associated with activities of daily living (ADLs) 6-point scale (0 = "not at all" to 5 = "maximal or unable to do because of breathlessness")  Scoring Scores range from 0 to 120.  Minimally important difference is 5 units  CAT: CAT can identify the health impairment of COPD patients and is better correlated with disease progression.  CAT has a scoring range of zero to 40. The CAT score is classified into four groups of low (less than 10), medium (10 - 20), high (21-30) and very high (31-40) based on the impact level of disease on health status. A CAT score over 10 suggests significant symptoms.  A worsening CAT score could be explained by an exacerbation, poor medication adherence, poor inhaler technique, or progression of COPD or comorbid conditions.  CAT MCID is 2 points  mMRC: mMRC (Modified Medical Research Council) Dyspnea Scale is used to assess the degree of baseline functional disability in patients of respiratory disease due to dyspnea. No minimal important difference is established. A decrease in score of 1 point or greater is considered a positive change.   Pulmonary Function Assessment:  Pulmonary Function Assessment - 08/29/20 0913       Breath   Shortness of Breath Yes;Limiting activity             Exercise Target Goals: Exercise Program Goal: Individual exercise prescription set using results from initial 6 min walk test and THRR while considering  patient's activity barriers and safety.   Exercise Prescription  Goal: Initial exercise prescription builds to 30-45 minutes a day of aerobic activity, 2-3 days per week.  Home exercise guidelines will be given to patient during program as part of exercise prescription that the participant will acknowledge.  Education: Aerobic Exercise: - Group verbal and visual presentation on the  components of exercise prescription. Introduces F.I.T.T principle from ACSM for exercise prescriptions.  Reviews F.I.T.T. principles of aerobic exercise including progression. Written material given at graduation.   Education: Resistance Exercise: - Group verbal and visual presentation on the components of exercise prescription. Introduces F.I.T.T principle from ACSM for exercise prescriptions  Reviews F.I.T.T. principles of resistance exercise including progression. Written material given at graduation. Flowsheet Row Pulmonary Rehab from 12/25/2020 in [facility] Cardiac and Pulmonary Rehab  Date 11/06/20  Educator AS  Instruction Review Code 1- Verbalizes Understanding        Education: Exercise & Equipment Safety: - Individual verbal instruction and demonstration of equipment use and safety with use of the equipment. Flowsheet Row Pulmonary Rehab from 12/25/2020 in [facility] Cardiac and Pulmonary Rehab  Date 09/02/20  Educator AS  Instruction Review Code 1- Verbalizes Understanding       Education: Exercise Physiology & General Exercise Guidelines: - Group verbal and written instruction with models to review the exercise physiology of the cardiovascular system and associated critical values. Provides general exercise guidelines with specific guidelines to those with heart or lung disease.  Flowsheet Row Pulmonary Rehab from 12/25/2020 in [facility] Cardiac and Pulmonary Rehab  Date 10/23/20  Educator AS  Instruction Review Code 1- Verbalizes Understanding       Education: Flexibility, Balance, Mind/Body Relaxation: - Group verbal and visual presentation with interactive activity on  the components of exercise prescription. Introduces F.I.T.T principle from ACSM for exercise prescriptions. Reviews F.I.T.T. principles of flexibility and balance exercise training including progression. Also discusses the mind body connection.  Reviews various relaxation techniques to help reduce and manage stress (i.e. Deep breathing, progressive muscle relaxation, and visualization). Balance handout provided to take home. Written material given at graduation. Flowsheet Row Pulmonary Rehab from 12/25/2020 in [facility] Cardiac and Pulmonary Rehab  Date 09/11/20  Educator AS  Instruction Review Code 1- Verbalizes Understanding       Activity Barriers & Risk Stratification:   6 Minute Walk:  6 Minute Walk     Row Name 09/02/20 1056 12/30/20 1425       6 Minute Walk   Phase Initial Discharge    Distance 950 feet 900 feet    Distance % Change -- -5 %    Distance Feet Change -- -50 ft    Walk Time 6 minutes 6 minutes    # of Rest Breaks 0 0    MPH 1.8 1.7    METS 1.95 1.64    RPE 12 13    Perceived Dyspnea  1 1    VO2 Peak 6.82 5.75    Symptoms No No    Resting HR 69 bpm 63 bpm    Resting BP 142/68 122/80    Resting Oxygen Saturation  95 % 98 %    Exercise Oxygen Saturation  during 6 min walk 85 % 88 %    Max Ex. HR 111 bpm 110 bpm    Max Ex. BP 164/78 124/82    2 Minute Post BP 144/68 --         Interval HR      1 Minute HR 90 105    2 Minute HR 81 108    3 Minute HR 105 110    4 Minute HR 109 107    5 Minute HR 111 98    6 Minute HR 103 99    2 Minute Post HR 98 94    Interval Heart Rate? Yes Yes  Interval Oxygen      Interval Oxygen? Yes Yes    Baseline Oxygen Saturation % 95 % 98 %    1 Minute Oxygen Saturation % 90 % 98 %    1 Minute Liters of Oxygen 4 L  pulsed 2 L    2 Minute Oxygen Saturation % 89 % 96 %    2 Minute Liters of Oxygen 4 L  pulsed 2 L    3 Minute Oxygen Saturation % 88 % 96 %    3 Minute Liters of Oxygen 4 L  pulsed 2 L    4 Minute Oxygen  Saturation % 87 % 96 %    4 Minute Liters of Oxygen 4 L  pulsed 2 L    5 Minute Oxygen Saturation % 85 % 88 %    5 Minute Liters of Oxygen 4 L  pulsed 2 L    6 Minute Oxygen Saturation % 85 % 92 %    6 Minute Liters of Oxygen 4 L  pulsed 2 L    2 Minute Post Oxygen Saturation % 88 % 98 %    2 Minute Post Liters of Oxygen 4 L  pulsed --           Oxygen Initial Assessment:  Oxygen Initial Assessment - 08/29/20 0911       Home Oxygen   Home Oxygen Device Portable Concentrator;Home Concentrator;E-Tanks    Sleep Oxygen Prescription Continuous   PRN   Liters per minute 2   PRN   Home Exercise Oxygen Prescription Continuous    Liters per minute 2   PRN   Home Resting Oxygen Prescription Continuous    Liters per minute 2   PRN   Compliance with Home Oxygen Use Yes      Initial 6 min Walk   Oxygen Used None      Program Oxygen Prescription   Program Oxygen Prescription None      Intervention   Short Term Goals To learn and exhibit compliance with exercise, home and travel O2 prescription;To learn and understand importance of monitoring SPO2 with pulse oximeter and demonstrate accurate use of the pulse oximeter.;To learn and understand importance of maintaining oxygen saturations>88%;To learn and demonstrate proper pursed lip breathing techniques or other breathing techniques. ;To learn and demonstrate proper use of respiratory medications    Long  Term Goals Exhibits compliance with exercise, home  and travel O2 prescription;Verbalizes importance of monitoring SPO2 with pulse oximeter and return demonstration;Maintenance of O2 saturations>88%;Exhibits proper breathing techniques, such as pursed lip breathing or other method taught during program session;Compliance with respiratory medication;Demonstrates proper use of MDI's             Oxygen Re-Evaluation:  Oxygen Re-Evaluation     Row Name 10/17/20 1411 01/02/21 1434           Program Oxygen Prescription   Program Oxygen  Prescription Continuous Continuous      Liters per minute 2 2      Comments -- Titrate oxygen to 1 Liter as of today.             Home Oxygen      Home Oxygen Device Home Concentrator;Portable Concentrator;E-Tanks Home Concentrator;Portable Concentrator;E-Tanks      Sleep Oxygen Prescription Continuous Continuous      Liters per minute 2 2      Home Exercise Oxygen Prescription Continuous Continuous      Liters per minute 2 2  Home Resting Oxygen Prescription Continuous None      Liters per minute 2 --      Compliance with Home Oxygen Use Yes Yes             Goals/Expected Outcomes      Short Term Goals To learn and understand importance of maintaining oxygen saturations>88% Other      Long  Term Goals Maintenance of O2 saturations>88% Other      Comments She does  have a pulse oximeter to check her oxygen saturation at home. Informed her where to get one and explained why it is important to have one. Reviewed that oxygen saturations should be 88 percent and above. Patient has a pulse oximeter at home to check his oxygen. Joyce Evans has been doing well with her oxygen readings lately and wants to try to titrate her oxygen. She started at 4 liters and has been on 2 liters. She wantst to go down to 1 liter for exercise.      Goals/Expected Outcomes Short: monitor oxygen at home with exertion. Long: maintain oxygen saturations above 88 percent independently. Short: Titrate to 1 liter of oxygen. Long: maintain oxygen above 88 percent, get of oxygen.              Oxygen Discharge (Final Oxygen Re-Evaluation):  Oxygen Re-Evaluation - 01/02/21 1434       Program Oxygen Prescription   Program Oxygen Prescription Continuous    Liters per minute 2    Comments Titrate oxygen to 1 Liter as of today.      Home Oxygen   Home Oxygen Device Home Concentrator;Portable Concentrator;E-Tanks    Sleep Oxygen Prescription Continuous    Liters per minute 2    Home Exercise Oxygen Prescription  Continuous    Liters per minute 2    Home Resting Oxygen Prescription None    Compliance with Home Oxygen Use Yes      Goals/Expected Outcomes   Short Term Goals Other    Long  Term Goals Other    Comments Joyce Evans has been doing well with her oxygen readings lately and wants to try to titrate her oxygen. She started at 4 liters and has been on 2 liters. She wantst to go down to 1 liter for exercise.    Goals/Expected Outcomes Short: Titrate to 1 liter of oxygen. Long: maintain oxygen above 88 percent, get of oxygen.             Initial Exercise Prescription:  Initial Exercise Prescription - 09/02/20 1100       Date of Initial Exercise RX and Referring Provider   Date 09/02/20    Referring Provider Kloefkorn      Treadmill   MPH 1.5    Grade 0    Minutes 15    METs 2      Recumbant Bike   Level 1    RPM 60    Minutes 15    METs 2      NuStep   Level 2    SPM 80    Minutes 15    METs 2      Recumbant Elliptical   Level 1    RPM 50    Minutes 15    METs 2      REL-XR   Level 2    Watts 50    Minutes 15    METs 2      Prescription Details   Frequency (times per week) 3  Duration Progress to 30 minutes of continuous aerobic without signs/symptoms of physical distress      Intensity   THRR 40-80% of Max Heartrate 98-127    Ratings of Perceived Exertion 11-13      Progression   Progression Continue progressive overload as per policy without signs/symptoms or physical distress.      Resistance Training   Training Prescription Yes    Weight 3 lb    Reps 10-15             Perform Capillary Blood Glucose checks as needed.  Exercise Prescription Changes:   Exercise Prescription Changes     Row Name 09/02/20 1100 09/05/20 1400 09/09/20 1600 09/23/20 1200 10/21/20 1000     Response to Exercise   Blood Pressure (Admit) 142/68 -- 110/64 142/82 124/60   Blood Pressure (Exercise) 164/78 -- 120/70 138/64 142/70   Blood Pressure (Exit) 144/68 --  122/56 124/72 114/62   Heart Rate (Admit) 69 bpm -- 62 bpm 96 bpm 70 bpm   Heart Rate (Exercise) 111 bpm -- 68 bpm 99 bpm 81 bpm   Heart Rate (Exit) 82 bpm -- 57 bpm 70 bpm 67 bpm   Oxygen Saturation (Admit) -- -- 93 % 92 % 97 %   Oxygen Saturation (Exercise) -- -- 100 % 98 % 91 %   Oxygen Saturation (Exit) -- -- 98 % 95 % 96 %   Rating of Perceived Exertion (Exercise) 12 -- $Rem'13 14 13   'aQJb$ Perceived Dyspnea (Exercise) 1 -- $Re'2 3 2   'mQF$ Symptoms none -- SOB SOB SOB   Duration -- -- Continue with 30 min of aerobic exercise without signs/symptoms of physical distress. Continue with 30 min of aerobic exercise without signs/symptoms of physical distress. Continue with 30 min of aerobic exercise without signs/symptoms of physical distress.   Intensity -- -- THRR unchanged THRR unchanged THRR unchanged     Progression   Progression -- -- Continue to progress workloads to maintain intensity without signs/symptoms of physical distress. Continue to progress workloads to maintain intensity without signs/symptoms of physical distress. Continue to progress workloads to maintain intensity without signs/symptoms of physical distress.   Average METs -- -- 1.91 2.98 2     Resistance Training   Training Prescription -- -- Yes Yes Yes   Weight -- -- 3 lb 3 lb 3 lb   Reps -- -- 10-15 10-15 10-15     Interval Training   Interval Training -- -- No No No     Treadmill   MPH -- -- 1.5 1.5 --   Grade -- -- 0 0 --   Minutes -- -- 15 15 --   METs -- -- 2.15 2.15 --     NuStep   Level -- -- 3 -- 3   SPM -- -- -- -- 80   Minutes -- -- 15 -- 15   METs -- -- 2 -- 2     Recumbant Elliptical   Level -- -- 1 -- --   Minutes -- -- 15 -- --   METs -- -- 1.5 -- --     REL-XR   Level -- -- -- 2 --   Watts -- -- -- 5 --   Minutes -- -- -- 15 --     Biostep-RELP   Level -- -- 2 -- 2   Minutes -- -- 15 -- 15   METs -- -- 2 -- 2     Home Exercise Plan   Plans to  continue exercise at -- Home (comment)  has [place] (comment)  has Programmer, applications -- --   Frequency -- Add 3 additional days to program exercise sessions. Add 3 additional days to program exercise sessions. -- --   Initial Home Exercises Provided -- 09/05/20 09/05/20 -- --    [place] Name 11/06/20 0800 11/19/20 1200 12/03/20 1500 12/17/20 0900 12/31/20 1500     Response to Exercise   Blood Pressure (Admit) 122/64 164/74 130/80 128/64 122/80   Blood Pressure (Exercise) 130/72 128/82 152/74 138/70 124/82   Blood Pressure (Exit) 122/80 132/72 138/80 132/70 94/62   Heart Rate (Admit) 68 bpm 100 bpm 79 bpm 71 bpm 63 bpm   Heart Rate (Exercise) 77 bpm 79 bpm 93 bpm 99 bpm 98 bpm   Heart Rate (Exit) 80 bpm 70 bpm 65 bpm 91 bpm 92 bpm   Oxygen Saturation (Admit) 99 % 87 % 79 % 98 % 98 %   Oxygen Saturation (Exercise) 94 % 98 % 93 % 93 % 96 %   Oxygen Saturation (Exit) 93 % 91 % 65 % 97 % 96 %   Rating of Perceived Exertion (Exercise) 12 13 14 14 14    Perceived Dyspnea (Exercise) 1 2 2 1 1    Symptoms SOB SOB SOB -- SOB   Duration Continue with 30 min of aerobic exercise without signs/symptoms of physical distress. Continue with 30 min of aerobic exercise without signs/symptoms of physical distress. Continue with 30 min of aerobic exercise without signs/symptoms of physical distress. Continue with 30 min of aerobic exercise without signs/symptoms of physical distress. Continue with 30 min of aerobic exercise without signs/symptoms of physical distress.   Intensity THRR unchanged THRR unchanged THRR unchanged THRR unchanged THRR unchanged     Progression   Progression Continue to progress workloads to maintain intensity without signs/symptoms of physical distress. Continue to progress workloads to maintain intensity without signs/symptoms of physical distress. Continue to progress workloads to maintain intensity without signs/symptoms of physical distress. Continue to progress workloads to maintain intensity without signs/symptoms of physical  distress. Continue to progress workloads to maintain intensity without signs/symptoms of physical distress.   Average METs 3.15 1.5 2.53 2.8 3.25     Resistance Training   Training Prescription Yes Yes Yes Yes Yes   Weight 3 lb 3 lb 3 lb 3 lb 3 lb   Reps 10-15 10-15 10-15 10-15 10-15     Interval Training   Interval Training No No No No No     Oxygen   Oxygen -- -- Continuous Continuous Continuous   Liters -- -- 2 2 2      Treadmill   MPH -- -- -- -- 1.5   Grade -- -- -- -- 0   Minutes -- -- -- -- 15   METs -- -- -- -- 2.15     Recumbant Bike   Level -- 2 4 -- --   Minutes -- 15 15 -- --   METs -- 3.03 -- -- --     NuStep   Level 2 -- 5 5 5    Minutes 15 -- 15 15 15    METs 2.3 -- 1.8 1.8 2.7     REL-XR   Level 3 -- 2 -- 4   Minutes 15 -- 15 -- 15   METs 4 -- 4 -- 4.9     T5 Nustep   Level -- 2 1 -- --   Minutes -- 15 15 -- --   METs --  1.7 1.8 -- --     Home Exercise Plan   Plans to continue exercise at Home (comment)  has [place] (comment)  has [place] (comment)  has [place] (comment)  has [place] (comment)  has Programmer, applications   Frequency Add 3 additional days to program exercise sessions. Add 3 additional days to program exercise sessions. Add 3 additional days to program exercise sessions. Add 3 additional days to program exercise sessions. Add 3 additional days to program exercise sessions.   Initial Home Exercises Provided 09/05/20 09/05/20 09/05/20 09/05/20 09/05/20            Exercise Comments:   Exercise Comments     Row Name 09/04/20 1418           Exercise Comments First full day of exercise!  Patient was oriented to gym and equipment including functions, settings, policies, and procedures.  Patient's individual exercise prescription and treatment plan were reviewed.  All starting workloads were established based on the results of the 6 minute walk test done at initial orientation visit.  The plan for  exercise progression was also introduced and progression will be customized based on patient's performance and goals.                Exercise Goals and Review:   Exercise Goals     Row Name 09/02/20 1104             Exercise Goals   Increase Physical Activity Yes       Intervention Provide advice, education, support and counseling about physical activity/exercise needs.;Develop an individualized exercise prescription for aerobic and resistive training based on initial evaluation findings, risk stratification, comorbidities and participant's personal goals.       Expected Outcomes Short Term: Attend rehab on a regular basis to increase amount of physical activity.;Long Term: Add in home exercise to make exercise part of routine and to increase amount of physical activity.;Long Term: Exercising regularly at least 3-5 days a week.       Increase Strength and Stamina Yes       Intervention Provide advice, education, support and counseling about physical activity/exercise needs.;Develop an individualized exercise prescription for aerobic and resistive training based on initial evaluation findings, risk stratification, comorbidities and participant's personal goals.       Expected Outcomes Short Term: Increase workloads from initial exercise prescription for resistance, speed, and METs.;Short Term: Perform resistance training exercises routinely during rehab and add in resistance training at home;Long Term: Improve cardiorespiratory fitness, muscular endurance and strength as measured by increased METs and functional capacity (6MWT)       Able to understand and use rate of perceived exertion (RPE) scale Yes       Intervention Provide education and explanation on how to use RPE scale       Expected Outcomes Short Term: Able to use RPE daily in rehab to express subjective intensity level;Long Term:  Able to use RPE to guide intensity level when exercising independently       Able to understand  and use Dyspnea scale Yes       Intervention Provide education and explanation on how to use Dyspnea scale       Expected Outcomes Short Term: Able to use Dyspnea scale daily in rehab to express subjective sense of shortness of breath during exertion;Long Term: Able to use Dyspnea scale to guide intensity level when exercising independently       Knowledge  and understanding of Target Heart Rate Range (THRR) Yes       Intervention Provide education and explanation of THRR including how the numbers were predicted and where they are located for reference       Expected Outcomes Short Term: Able to use daily as guideline for intensity in rehab;Short Term: Able to state/look up THRR;Long Term: Able to use THRR to govern intensity when exercising independently       Able to check pulse independently Yes       Intervention Provide education and demonstration on how to check pulse in carotid and radial arteries.;Review the importance of being able to check your own pulse for safety during independent exercise       Expected Outcomes Short Term: Able to explain why pulse checking is important during independent exercise;Long Term: Able to check pulse independently and accurately       Understanding of Exercise Prescription Yes       Intervention Provide education, explanation, and written materials on patient's individual exercise prescription       Expected Outcomes Short Term: Able to explain program exercise prescription;Long Term: Able to explain home exercise prescription to exercise independently                Exercise Goals Re-Evaluation :  Exercise Goals Re-Evaluation     Row Name 09/04/20 1419 09/05/20 1442 09/09/20 1622 09/23/20 1219 10/08/20 1444     Exercise Goal Re-Evaluation   Exercise Goals Review Increase Physical Activity;Knowledge and understanding of Target Heart Rate Range (THRR);Understanding of Exercise Prescription;Able to understand and use rate of perceived exertion (RPE)  scale;Increase Strength and Stamina;Able to understand and use Dyspnea scale;Able to check pulse independently Increase Physical Activity;Increase Strength and Stamina Increase Physical Activity;Increase Strength and Stamina;Understanding of Exercise Prescription Increase Physical Activity;Increase Strength and Stamina --   Comments Reviewed RPE and dyspnea scales, THR and program prescription with pt today.  Pt voiced understanding and was given a copy of goals to take home. Reviewed home exercise with pt today.  Pt plans to use the [place] machine for exercise.  Reviewed THR, pulse, RPE, sign and symptoms, pulse oximetery and when to call 911 or MD.  Also discussed weather considerations and indoor options.  Pt voiced understanding. Joyce Evans is off to a good start in rehab.  She has completed her first three full days of exercise and already exercising on her own at home.  She is getting in her full 30 min already too!  We will continue to monitor her progress. Judys oxygen and BP have been in good range during exercise.  Staff will encourage trying 4 lb weights. Out for 3 week in [place]   Expected Outcomes Short: Use RPE daily to regulate intensity. Long: Follow program prescription in THR. Short: monitor HR and oxygen while exercising at home  Long: improve MET level Short: Continue to attend rehab regularly Long: Continue to improve stamina. Short: move up to 4 lb weights Long:  improve overall stamina --    Row Name 10/17/20 1413 10/21/20 1032 11/06/20 0809 11/14/20 1423 11/19/20 1254     Exercise Goal Re-Evaluation   Exercise Goals Review Increase Strength and Stamina;Increase Physical Activity Increase Physical Activity;Increase Strength and Stamina Increase Physical Activity;Increase Strength and Stamina;Understanding of Exercise Prescription Increase Physical Activity;Increase Strength and Stamina Increase Physical Activity;Increase Strength and Stamina   Comments Evalina was walking and being fairly  active when she was out of town. She is ready to get back to  exercsie and ready to improve more. Joyce Evans's oxygen stays in the 90s with exercise.  She does not quite reach THR range.  Staff will review and encourage her to reach THR range. Joyce Evans is doing well rehab.  She is up to level 3 on the XR.  We will continue to monitor her progress. Evadne still uses her teeter at home on days not at rehab.  She can tell her legs are stronger.  Her knee still bothers her some when weight bearing.  She sees orthopedic Dr May 23.  SHe is up to level 5 on NS Kaelynne continues to tolerate exercise well.  Staff will encourage increasing weight to 4 lb for strength work   Expected Outcomes Short:attend [name] routinely. Long: maintain an exercise routine post [name] Short:  reach THR range durign exercise Long:  improve overall stamina Short: Get NuStep back up to level 3  Long: Continue to improve stamina Short: continue to exercise consistently Long:  build overall stamina Short: try 4 lb Long: continue to build stamina    Row Name 12/03/20 1530 12/04/20 1427 12/17/20 0907 12/31/20 1505       Exercise Goal Re-Evaluation   Exercise Goals Review Increase Physical Activity;Increase Strength and Stamina;Understanding of Exercise Prescription Increase Physical Activity;Increase Strength and Stamina Increase Physical Activity;Increase Strength and Stamina Increase Physical Activity;Increase Strength and Stamina;Understanding of Exercise Prescription    Comments Joyce Evans is doing well in rehab.  She is up to level 4 on the bike.  We will continue to monitor her progress. Joyce Evans saw Dr about her knee yesterday.  They recommended some inserts for her shoes to relieve arthritis pain.  She also has  exercises to do at home Joyce Evans is doing well. She attends consistently and works at lower end of HR range.  She also has a Agricultural consultant at home she uses Joyce Evans is doing well in rehab.  She did not improve her post distance, but her oxygen saturations  were greatly improved during her post test.  She planning to continue to exercise at home by using her Waldo Laine.    Expected Outcomes Short: Move up T5 NuStep Long: Continue to improve stamina Short: continue PT exercises Long:  get knee stronger Short: maintain consistent exercise Long:  continue to build stamina Short: Continue to work towards graduation Long: Continue to exercise independently             Discharge Exercise Prescription (Final Exercise Prescription Changes):  Exercise Prescription Changes - 12/31/20 1500       Response to Exercise   Blood Pressure (Admit) 122/80    Blood Pressure (Exercise) 124/82    Blood Pressure (Exit) 94/62    Heart Rate (Admit) 63 bpm    Heart Rate (Exercise) 98 bpm    Heart Rate (Exit) 92 bpm    Oxygen Saturation (Admit) 98 %    Oxygen Saturation (Exercise) 96 %    Oxygen Saturation (Exit) 96 %    Rating of Perceived Exertion (Exercise) 14    Perceived Dyspnea (Exercise) 1    Symptoms SOB    Duration Continue with 30 min of aerobic exercise without signs/symptoms of physical distress.    Intensity THRR unchanged      Progression   Progression Continue to progress workloads to maintain intensity without signs/symptoms of physical distress.    Average METs 3.25      Resistance Training   Training Prescription Yes    Weight 3 lb    Reps 10-15  Interval Training   Interval Training No      Oxygen   Oxygen Continuous    Liters 2      Treadmill   MPH 1.5    Grade 0    Minutes 15    METs 2.15      NuStep   Level 5    Minutes 15    METs 2.7      REL-XR   Level 4    Minutes 15    METs 4.9      Home Exercise Plan   Plans to continue exercise at Home (comment)   has Programmer, applications   Frequency Add 3 additional days to program exercise sessions.    Initial Home Exercises Provided 09/05/20             Nutrition:  Target Goals: Understanding of nutrition guidelines, daily intake of sodium '1500mg'$ , cholesterol  '200mg'$ , calories 30% from fat and 7% or less from saturated fats, daily to have 5 or more servings of fruits and vegetables.  Education: All About Nutrition: -Group instruction provided by verbal, written material, interactive activities, discussions, models, and posters to present general guidelines for heart healthy nutrition including fat, fiber, MyPlate, the role of sodium in heart healthy nutrition, utilization of the nutrition label, and utilization of this knowledge for meal planning. Follow up email sent as well. Written material given at graduation. Flowsheet Row Pulmonary Rehab from 12/25/2020 in [facility] Cardiac and Pulmonary Rehab  Date 11/20/20  Educator [facility]  Instruction Review Code 1- Verbalizes Understanding       Biometrics:  Pre Biometrics - 09/02/20 1105       Pre Biometrics   Height 5' 2.75" (1.594 m)    Weight 176 lb 3.2 oz (79.9 kg)    BMI (Calculated) 31.46             Post Biometrics - 12/30/20 1425        Post  Biometrics   Height 5' 2.75" (1.594 m)    Weight 162 lb (73.5 kg)    BMI (Calculated) 28.92    Single Leg Stand 4.28 seconds             Nutrition Therapy Plan and Nutrition Goals:  Nutrition Therapy & Goals - 09/02/20 1020       Nutrition Therapy   Diet Heart healthy, low Na, pulmonary MNT    Drug/Food Interactions Statins/Certain Fruits    Protein (specify units) 80g    Fiber 25 grams    Whole Grain Foods 3 servings    Saturated Fats 12 max. grams    Fruits and Vegetables 8 servings/day    Sodium 1.5 grams      Personal Nutrition Goals   Nutrition Goal ST: Do not add salt to home cooked meals, eat 6 small meals/day LT: limit eating out to 2-3x/week, do not cook with salt, improve breathing with meals.    Comments She has been getting Hello Fresh. She is going on a Cruise soon in March. She believes her problem is portion control. B: egg and cheese wrap - dunkin doughnuts or bran flake cereal with blueberries L: She reports eating out  a lot for lunch - taco bell - bean burrito supreme and plain taco, [place] restaurants, red lobster (salmon, brussels sprouts). D: leftovers, hello fresh, or fixes a meal herself - avocado oil, green beans, sea salt, turnip greens, spinach, roasted carrots, brussells sprouts, pork chops, salmon cakes. Drinks: coffee (decaf, caramel macchiado creamer) or green  tea and lots of water. Uses benecol instead of butter. She reports liking cheese - havarti, [place], swiss, [place]. She reports eating a lot of peanut butter (natural Jiff). Uses almond milk. She reports eating beans with corn sometimes. She is still strugglin with heartburn. Reviewed heart healthy eating and pulmonary freindly eating. Plan to reduce salt at home, eat smaller -more frequent meals to help with breathing, then reduce frequency of eating out.      Intervention Plan   Intervention Prescribe, educate and counsel regarding individualized specific dietary modifications aiming towards targeted core components such as weight, hypertension, lipid management, diabetes, heart failure and other comorbidities.;Nutrition handout(s) given to patient.    Expected Outcomes Short Term Goal: Understand basic principles of dietary content, such as calories, fat, sodium, cholesterol and nutrients.;Short Term Goal: A plan has been developed with personal nutrition goals set during dietitian appointment.;Long Term Goal: Adherence to prescribed nutrition plan.             Nutrition Assessments:  MEDIFICTS Score Key: ?70 Need to make dietary changes  40-70 Heart Healthy Diet ? 40 Therapeutic Level Cholesterol Diet  Flowsheet Row Pulmonary Rehab from 01/02/2021 in [facility] Card[facility] and Pulmonary Rehab  Picture Your Plate Total Score on Discharge 69      Picture Your Plate Scores: <26 Unhealthy dietary pattern with much room for improvement. 41-50 Dietary pattern unlikely to meet recommendations for good health and room for improvement. 51-60 More  healthful dietary pattern, with some room for improvement.  >60 Healthy dietary pattern, although there may be some specific behaviors that could be improved.   Nutrition Goals Re-Evaluation:  Nutrition Goals Re-Evaluation     [place] Name 10/17/20 1416 11/14/20 1426 01/02/21 1440         Goals   Current Weight 181 lb (82.1 kg) -- 161 lb (73 kg)     Nutrition Goal Cut back on sodium -- Keep a lkower sodium diet.     Comment Joyce Evans knows that she needs to cut back on salt. She was out of town on a Cruise for 2 weeks. She feels like her weight is up since she left. She wants to work on getting her weight back down. Informed her to watch her weight and manage her sodium now that she is back from her trip . Scherrie states her weight is coming back down.  She continues to watch sodium. Joyce Evans is more aware of what she eats. She is trying to watch higher sodium foods. She would like to work on choosing lower sodium plates when going out to dinner.     Expected Outcome Short: decrease sodium intake. Long: lose more weight and keep a low sodium diet. Short: continue to watch sodium intake Long:  reach goal weight ad keep heart healthy diet Short: watch sodium and lose weight. Long: reach weight goal              Nutrition Goals Discharge (Final Nutrition Goals Re-Evaluation):  Nutrition Goals Re-Evaluation - 01/02/21 1440       Goals   Current Weight 161 lb (73 kg)    Nutrition Goal Keep a lkower sodium diet.    Comment Joyce Evans is more aware of what she eats. She is trying to watch higher sodium foods. She would like to work on choosing lower sodium plates when going out to dinner.    Expected Outcome Short: watch sodium and lose weight. Long: reach weight goal  Psychosocial: Target Goals: Acknowledge presence or absence of significant depression and/or stress, maximize coping skills, provide positive support system. Participant is able to verbalize types and ability to use techniques and  skills needed for reducing stress and depression.   Education: Stress, Anxiety, and Depression - Group verbal and visual presentation to define topics covered.  Reviews how body is impacted by stress, anxiety, and depression.  Also discusses healthy ways to reduce stress and to treat/manage anxiety and depression.  Written material given at graduation. Flowsheet Row Pulmonary Rehab from 12/25/2020 in [facility] Card[facility] and Pulmonary Rehab  Date 10/16/20  Educator [facility]  Instruction Review Code 1- [employer] Understanding       Education: Sleep Hygiene -Provides group verbal and written instruction about how sleep can affect your health.  Define sleep hygiene, discuss sleep cycles and impact of sleep habits. Review good sleep hygiene tips.  Flowsheet Row Cardiac Rehab from 05/30/2018 in [facility] Cardiac and Pulmonary Rehab  Date 03/16/18  Educator [facility]  Instruction Review Code 1- Verbalizes Understanding       Initial Review & Psychosocial Screening:  Initial Psych Review & Screening - 08/29/20 0915       Initial Review   Current issues with Current Psychotropic Meds;Current Anxiety/Panic    Comments She sometimes is anxious and is a positive person in general.      Family Dynamics   Good Support System? Yes    Comments Lurleen can look to her husband and her two daughters for support.      Barriers   Psychosocial barriers to participate in program The patient should benefit from training in stress management and relaxation.      Screening Interventions   Interventions To provide support and resources with identified psychosocial needs;Encouraged to exercise;Provide feedback about the scores to participant    Expected Outcomes Short Term goal: Utilizing psychosocial counselor, staff and physician to assist with identification of specific Stressors or current issues interfering with healing process. Setting desired goal for each stressor or current issue identified.;Long Term Goal: Stressors or  current issues are controlled or eliminated.;Short Term goal: Identification and review with participant of any Quality of Life or Depression concerns found by scoring the questionnaire.;Long Term goal: The participant improves quality of Life and PHQ9 Scores as seen by post scores and/or verbalization of changes             Quality of Life Scores:  Scores of 19 and below usually indicate a poorer quality of life in these areas.  A difference of  2-3 points is a clinically meaningful difference.  A difference of 2-3 points in the total score of the Quality of Life Index has been associated with significant improvement in overall quality of life, self-image, physical symptoms, and general health in studies assessing change in quality of life.  PHQ-9: Recent Review Flowsheet Data     Depression screen [employer] [facility] 2/9 01/02/2021 09/02/2020 05/30/2018 03/08/2018   Decreased Interest 0 0 0 1   Down, Depressed, Hopeless 0 0 0 0   PHQ - 2 Score 0 0 0 1   Altered sleeping 1 - - 0   Tired, decreased energy $RemoveBeforeDE'1 1 1 1   '[id number]$ Change in appetite 0 1 0 0   Feeling bad or failure about yourself  0 0 0 0   Trouble concentrating 0 0 0 0   Moving slowly or fidgety/restless 0 0 0 0   Suicidal thoughts 0 0 0 0   PHQ-9 Score 2 - -  2   Difficult doing work/chores Not difficult at all Not difficult at all - Not difficult at all      Interpretation of Total Score  Total Score Depression Severity:  1-4 = Minimal depression, 5-9 = Mild depression, 10-14 = Moderate depression, 15-19 = Moderately severe depression, 20-27 = Severe depression   Psychosocial Evaluation and Intervention:  Psychosocial Evaluation - 01/02/21 1437       Psychosocial Evaluation & Interventions   Interventions Encouraged to exercise with the program and follow exercise prescription    Comments Joyce Evans has been doing well and she feels like she is more mentally stable since her health is getting better.    Continue Psychosocial Services  No Follow  up required             Psychosocial Re-Evaluation:  Psychosocial Re-Evaluation     [place] Name 10/17/20 1423 11/14/20 1428 12/04/20 1432         Psychosocial Re-Evaluation   Current issues with None Identified None Identified Current Stress Concerns     Comments Patient reports no issues with their current mental states, sleep, stress, depression or anxiety. Will follow up with patient in a few weeks for any changes. Joyce Evans reports no changes in stress.  She says her husband likes to travel ad they have trips planned. Joyce Evans still has no stressors and sleeps well.     Expected Outcomes Short: Continue to exercise regularly to support mental health and notify staff of any changes. Long: maintain mental health and well being through teaching of rehab or prescribed medications independently. Short:  continue to exercise to help keep strong Long:  maintain positive outlook Short:  continue to exercise to help keep strong Long:  maintain positive outlook     Interventions Encouraged to attend Pulmonary Rehabilitation for the exercise -- --     Continue Psychosocial Services  Follow up required by staff -- --              Psychosocial Discharge (Final Psychosocial Re-Evaluation):  Psychosocial Re-Evaluation - 12/04/20 1432       Psychosocial Re-Evaluation   Current issues with Current Stress Concerns    Comments Joyce Evans still has no stressors and sleeps well.    Expected Outcomes Short:  continue to exercise to help keep strong Long:  maintain positive outlook             Education: Education Goals: Education classes will be provided on a weekly basis, covering required topics. Participant will state understanding/return demonstration of topics presented.  Learning Barriers/Preferences:  Learning Barriers/Preferences - 08/29/20 0910       Learning Barriers/Preferences   Learning Barriers None    Learning Preferences None             General Pulmonary Education  Topics:  Infection Prevention: - Provides verbal and written material to individual with discussion of infection control including proper hand washing and proper equipment cleaning during exercise session. Flowsheet Row Pulmonary Rehab from 12/25/2020 in [facility] Cardiac and Pulmonary Rehab  Date 09/02/20  Educator As  Instruction Review Code 1- Verbalizes Understanding       Falls Prevention: - Provides verbal and written material to individual with discussion of falls prevention and safety. Flowsheet Row Pulmonary Rehab from 08/29/2020 in [facility] Cardiac and Pulmonary Rehab  Date 08/29/20  Educator [facility]  Instruction Review Code 1- Verbalizes Understanding       Chronic Lung Disease Review: - Group verbal instruction with posters, models, PowerPoint presentations and  videos,  to review new updates, new respiratory medications, new advancements in procedures and treatments. Providing information on websites and "800" numbers for continued self-education. Includes information about supplement oxygen, available portable oxygen systems, continuous and intermittent flow rates, oxygen safety, concentrators, and Medicare reimbursement for oxygen. Explanation of Pulmonary Drugs, including class, frequency, complications, importance of spacers, rinsing mouth after steroid MDI's, and proper cleaning methods for nebulizers. Review of basic lung anatomy and physiology related to function, structure, and complications of lung disease. Review of risk factors. Discussion about methods for diagnosing sleep apnea and types of masks and machines for OSA. Includes a review of the use of types of environmental controls: home humidity, furnaces, filters, dust mite/pet prevention, HEPA vacuums. Discussion about weather changes, air quality and the benefits of nasal washing. Instruction on Warning signs, infection symptoms, calling MD promptly, preventive modes, and value of vaccinations. Review of effective airway clearance,  coughing and/or vibration techniques. Emphasizing that all should Create an Action Plan. Written material given at graduation. Flowsheet Row Pulmonary Rehab from 12/25/2020 in [facility] Cardiac and Pulmonary Rehab  Date 12/11/20  Educator [facility]  Instruction Review Code 1- Verbalizes Understanding       AED/CPR: - Group verbal and written instruction with the use of models to demonstrate the basic use of the AED with the basic ABC's of resuscitation.    Anatomy and Cardiac Procedures: - Group verbal and visual presentation and models provide information about basic cardiac anatomy and function. Reviews the testing methods done to diagnose heart disease and the outcomes of the test results. Describes the treatment choices: Medical Management, Angioplasty, or Coronary Bypass Surgery for treating various heart conditions including Myocardial Infarction, Angina, Valve Disease, and Cardiac Arrhythmias.  Written material given at graduation. Flowsheet Row Pulmonary Rehab from 12/25/2020 in [facility] Cardiac and Pulmonary Rehab  Date 11/06/20  Educator [facility]  Instruction Review Code 1- Verbalizes Understanding       Medication Safety: - Group verbal and visual instruction to review commonly prescribed medications for heart and lung disease. Reviews the medication, class of the drug, and side effects. Includes the steps to properly store meds and maintain the prescription regimen.  Written material given at graduation.   Other: -Provides group and verbal instruction on various topics (see comments)   Knowledge Questionnaire Score:  Knowledge Questionnaire Score - 01/02/21 1409       Knowledge Questionnaire Score   Post Score 17/18              Core Components/Risk Factors/Patient Goals at Admission:  Personal Goals and Risk Factors at Admission - 09/02/20 1114       Core Components/Risk Factors/Patient Goals on Admission    Weight Management Yes;Weight Loss    Intervention Weight Management:  Develop a combined nutrition and exercise program designed to reach desired caloric intake, while maintaining appropriate intake of nutrient and fiber, sodium and fats, and appropriate energy expenditure required for the weight goal.;Weight Management: Provide education and appropriate resources to help participant work on and attain dietary goals.;Weight Management/Obesity: Establish reasonable short term and long term weight goals.    Admit Weight 176 lb 3.2 oz (79.9 kg)    Goal Weight: Short Term 175 lb (79.4 kg)    Goal Weight: Long Term 170 lb (77.1 kg)    Expected Outcomes Short Term: Continue to assess and modify interventions until short term weight is achieved;Long Term: Adherence to nutrition and physical activity/exercise program aimed toward attainment of established weight goal;Weight Loss: Understanding  of general recommendations for a balanced deficit meal plan, which promotes 1-2 lb weight loss per week and includes a negative energy balance of [phone] kcal/d;Understanding recommendations for meals to include 15-35% energy as protein, 25-35% energy from fat, 35-60% energy from carbohydrates, less than 200mg  of dietary cholesterol, 20-35 gm of total fiber daily;Understanding of distribution of calorie intake throughout the day with the consumption of 4-5 meals/snacks    Improve shortness of breath with ADL's Yes    Intervention Provide education, individualized exercise plan and daily activity instruction to help decrease symptoms of SOB with activities of daily living.    Expected Outcomes Short Term: Improve cardiorespiratory fitness to achieve a reduction of symptoms when performing ADLs;Long Term: Be able to perform more ADLs without symptoms or delay the onset of symptoms    Heart Failure Yes    Intervention Provide a combined exercise and nutrition program that is supplemented with education, support and counseling about heart failure. Directed toward relieving symptoms such as  shortness of breath, decreased exercise tolerance, and extremity edema.    Expected Outcomes Improve functional capacity of life;Short term: Attendance in program 2-3 days a week with increased exercise capacity. Reported lower sodium intake. Reported increased fruit and vegetable intake. Reports medication compliance.;Short term: Daily weights obtained and reported for increase. Utilizing diuretic protocols set by physician.;Long term: Adoption of self-care skills and reduction of barriers for early signs and symptoms recognition and intervention leading to self-care maintenance.    Hypertension Yes    Intervention Provide education on lifestyle modifcations including regular physical activity/exercise, weight management, moderate sodium restriction and increased consumption of fresh fruit, vegetables, and low fat dairy, alcohol moderation, and smoking cessation.;Monitor prescription use compliance.    Expected Outcomes Short Term: Continued assessment and intervention until BP is < 140/15mm HG in hypertensive participants. < 130/40mm HG in hypertensive participants with diabetes, heart failure or chronic kidney disease.;Long Term: Maintenance of blood pressure at goal levels.    Lipids Yes    Intervention Provide education and support for participant on nutrition & aerobic/resistive exercise along with prescribed medications to achieve LDL 70mg , HDL >40mg .    Expected Outcomes Short Term: Participant states understanding of desired cholesterol values and is compliant with medications prescribed. Participant is following exercise prescription and nutrition guidelines.;Long Term: Cholesterol controlled with medications as prescribed, with individualized exercise RX and with personalized nutrition plan. Value goals: LDL < 70mg , HDL > 40 mg.             Education:Diabetes - Individual verbal and written instruction to review signs/symptoms of diabetes, desired ranges of glucose level fasting, after  meals and with exercise. Acknowledge that pre and post exercise glucose checks will be done for 3 sessions at entry of program.   Know Your Numbers and Heart Failure: - Group verbal and visual instruction to discuss disease risk factors for cardiac and pulmonary disease and treatment options.  Reviews associated critical values for Overweight/Obesity, Hypertension, Cholesterol, and Diabetes.  Discusses basics of heart failure: signs/symptoms and treatments.  Introduces Heart Failure Zone chart for action plan for heart failure.  Written material given at graduation. Flowsheet Row Pulmonary Rehab from 12/25/2020 in [facility]  Date 12/04/20  Educator [facility]  Instruction Review Code 1- Verbalizes Understanding       Core Components/Risk Factors/Patient Goals Review:   Goals and Risk Factor Review     Row Name 10/17/20 1414 11/14/20 1420 12/04/20 1429 01/02/21 1438  Core Components/Risk Factors/Patient Goals Review   Personal Goals Review Improve shortness of breath with ADL's Improve shortness of breath with ADL's;Develop more efficient breathing techniques such as purse lipped breathing and diaphragmatic breathing and practicing self-pacing with activity. Improve shortness of breath with ADL's;Develop more efficient breathing techniques such as purse lipped breathing and diaphragmatic breathing and practicing self-pacing with activity. Weight Management/Obesity    Review Spoke to patient about their shortness of breath and what they can do to improve. Patient has been informed of breathing techniques when starting the program. Patient is informed to tell staff if they have had any med changes and that certain meds they are taking or not taking can be causing shortness of breath. Joyce Evans states her breathing has been better and she rarely needs her oxygen at home.  She is using PLB and a spirometer at home.  They had mold removed from their home and feels that has helped as  well. Joyce Evans states her breathing is still good and she doesnt use oxygen much at home.  She says since she has been exercising she doesnt have to use oxygen as much even going out.  She sees Science writer. Joyce Evans wants to reach a weight goal of 150 pounds. She was about 170 pounds when she started. She is excited that she has lost weight during the program.    Expected Outcomes Short: Attend [name] regularly to improve shortness of breath with ADL's. Long: maintain independence with ADL's Short: conintue to exercise and practice PL breathing Long: maintian idependence with ADLs Short: follow through with pulmonologist Long:  continue to use PLB as needed Short: lose 5 more pounds in two weeks. Long: reach weight goal of 150 pounds.             Core Components/Risk Factors/Patient Goals at Discharge (Final Review):   Goals and Risk Factor Review - 01/02/21 1438       Core Components/Risk Factors/Patient Goals Review   Personal Goals Review Weight Management/Obesity    Review Joyce Evans wants to reach a weight goal of 150 pounds. She was about 170 pounds when she started. She is excited that she has lost weight during the program.    Expected Outcomes Short: lose 5 more pounds in two weeks. Long: reach weight goal of 150 pounds.             ITP Comments:  ITP Comments     Row Name 08/29/20 0910 09/02/20 1119 09/04/20 0633 09/04/20 1418 10/02/20 0742   ITP Comments Virtual Visit completed. Patient informed on EP and RD appointment and 6 Minute walk test. Patient also informed of patient health questionnaires on My Chart. Patient Verbalizes understanding. Visit diagnosis can be found in [facility]/09/2020. Completed 6MWT and gym orientation. Initial ITP created and sent for review to Dr. Emily Filbert, Medical Director. 30 Day review completed. Medical Director ITP review done, changes made as directed, and signed approval by Medical Director.  New to program First full day of exercise!  Patient  was oriented to gym and equipment including functions, settings, policies, and procedures.  Patient's individual exercise prescription and treatment plan were reviewed.  All starting workloads were established based on the results of the 6 minute walk test done at initial orientation visit.  The plan for exercise progression was also introduced and progression will be customized based on patient's performance and goals. 30 Day review completed. Medical Director ITP review done, changes made as directed, and signed approval by Medical Director.  Row Name 10/08/20 1444 10/30/20 0956 11/27/20 0655 12/25/20 0741 01/09/21 1410   ITP Comments Out for 3 weeks in [place] 30 Day review completed. Medical Director ITP review done, changes made as directed, and signed approval by Medical Director. 30 Day review completed. Medical Director ITP review done, changes made as directed, and signed approval by Medical Director. 30 Day review completed. Medical Director ITP review done, changes made as directed, and signed approval by Medical Director. Joyce Evans graduated today from  rehab with 36 sessions completed.  Details of the patient's exercise prescription and what She needs to do in order to continue the prescription and progress were discussed with patient.  Patient was given a copy of prescription and goals.  Patient verbalized understanding.  Joyce Evans plans to continue to exercise by using her home teeter machine.            Comments: Discharge ITP

## 2021-05-15 ENCOUNTER — Other Ambulatory Visit: Payer: Self-pay | Admitting: Family Medicine

## 2021-05-15 DIAGNOSIS — N63 Unspecified lump in unspecified breast: Secondary | ICD-10-CM

## 2021-05-16 ENCOUNTER — Other Ambulatory Visit: Payer: Self-pay | Admitting: *Deleted

## 2021-05-16 ENCOUNTER — Inpatient Hospital Stay
Admission: RE | Admit: 2021-05-16 | Discharge: 2021-05-16 | Disposition: A | Discharge status: Home / Self Care | Payer: Self-pay | Source: Ambulatory Visit | Attending: *Deleted | Admitting: *Deleted

## 2021-05-16 DIAGNOSIS — Z1231 Encounter for screening mammogram for malignant neoplasm of breast: Secondary | ICD-10-CM

## 2021-05-28 ENCOUNTER — Ambulatory Visit
Admission: RE | Admit: 2021-05-28 | Discharge: 2021-05-28 | Disposition: A | Discharge status: Home / Self Care | Payer: Medicare PPO | Source: Ambulatory Visit | Attending: Family Medicine | Admitting: Family Medicine

## 2021-05-28 ENCOUNTER — Other Ambulatory Visit: Payer: Self-pay

## 2021-05-28 DIAGNOSIS — N6312 Unspecified lump in the right breast, upper inner quadrant: Secondary | ICD-10-CM | POA: Insufficient documentation

## 2021-05-28 DIAGNOSIS — N63 Unspecified lump in unspecified breast: Secondary | ICD-10-CM | POA: Insufficient documentation

## 2021-07-30 ENCOUNTER — Ambulatory Visit: Payer: Medicare PPO | Admitting: Dermatology

## 2021-07-30 ENCOUNTER — Other Ambulatory Visit: Payer: Self-pay

## 2021-07-30 DIAGNOSIS — Z85820 Personal history of malignant melanoma of skin: Secondary | ICD-10-CM | POA: Diagnosis not present

## 2021-07-30 DIAGNOSIS — Z1283 Encounter for screening for malignant neoplasm of skin: Secondary | ICD-10-CM | POA: Diagnosis not present

## 2021-07-30 DIAGNOSIS — D1800 Hemangioma unspecified site: Secondary | ICD-10-CM

## 2021-07-30 DIAGNOSIS — L814 Other melanin hyperpigmentation: Secondary | ICD-10-CM

## 2021-07-30 DIAGNOSIS — J84112 Idiopathic pulmonary fibrosis: Secondary | ICD-10-CM | POA: Diagnosis not present

## 2021-07-30 DIAGNOSIS — L570 Actinic keratosis: Secondary | ICD-10-CM | POA: Diagnosis not present

## 2021-07-30 DIAGNOSIS — L820 Inflamed seborrheic keratosis: Secondary | ICD-10-CM | POA: Diagnosis not present

## 2021-07-30 DIAGNOSIS — D485 Neoplasm of uncertain behavior of skin: Secondary | ICD-10-CM

## 2021-07-30 DIAGNOSIS — D229 Melanocytic nevi, unspecified: Secondary | ICD-10-CM

## 2021-07-30 DIAGNOSIS — D692 Other nonthrombocytopenic purpura: Secondary | ICD-10-CM

## 2021-07-30 DIAGNOSIS — L821 Other seborrheic keratosis: Secondary | ICD-10-CM

## 2021-07-30 DIAGNOSIS — Z85828 Personal history of other malignant neoplasm of skin: Secondary | ICD-10-CM

## 2021-07-30 DIAGNOSIS — L578 Other skin changes due to chronic exposure to nonionizing radiation: Secondary | ICD-10-CM

## 2021-07-30 NOTE — Progress Notes (Signed)
Follow-Up Visit   Subjective  Joyce Evans is a 81 y.o. female who presents for the following: Annual Exam (History of Melanoma (05/2016) and SCC - TBSE today). The patient presents for Total-Body Skin Exam (TBSE) for skin cancer screening and mole check.  The patient has spots, moles and lesions to be evaluated, some may be new or changing and the patient has concerns that these could be cancer.  The following portions of the chart were reviewed this encounter and updated as appropriate:   Tobacco   Allergies   Meds   Problems   Med Hx   Surg Hx   Fam Hx      Review of Systems:  No other skin or systemic complaints except as noted in HPI or Assessment and Plan.  Objective  Well appearing patient in no apparent distress; mood and affect are within normal limits.  A full examination was performed including scalp, head, eyes, ears, nose, lips, neck, chest, axillae, abdomen, back, buttocks, bilateral upper extremities, bilateral lower extremities, hands, feet, fingers, toes, fingernails, and toenails. All findings within normal limits unless otherwise noted below.  Face (7) Erythematous stuck-on, waxy papule or plaque  Face (2) Erythematous thin papules/macules with gritty scale.   Mid Tip of Nose Crust   Assessment & Plan   History of Melanoma - No evidence of recurrence today - No lymphadenopathy - Recommend regular full body skin exams - Recommend daily broad spectrum sunscreen SPF 30+ to sun-exposed areas, reapply every 2 hours as needed.  - Call if any new or changing lesions are noted between office visits  History of Squamous Cell Carcinoma of the Skin - No evidence of recurrence today - No lymphadenopathy - Recommend regular full body skin exams - Recommend daily broad spectrum sunscreen SPF 30+ to sun-exposed areas, reapply every 2 hours as needed.  - Call if any new or changing lesions are noted between office visits  Purpura - Chronic; persistent and recurrent.   Treatable, but not curable. - Violaceous macules and patches - Benign - Related to trauma, age, sun damage and/or use of blood thinners, chronic use of topical and/or oral steroids - Observe - Can use OTC arnica containing moisturizer such as Dermend Bruise Formula if desired - Call for worsening or other concerns  Lentigines - Scattered tan macules - Due to sun exposure - Benign-appearing, observe - Recommend daily broad spectrum sunscreen SPF 30+ to sun-exposed areas, reapply every 2 hours as needed. - Call for any changes  Seborrheic Keratoses - Stuck-on, waxy, tan-brown papules and/or plaques  - Benign-appearing - Discussed benign etiology and prognosis. - Observe - Call for any changes  Melanocytic Nevi - Tan-brown and/or pink-flesh-colored symmetric macules and papules - Benign appearing on exam today - Observation - Call clinic for new or changing moles - Recommend daily use of broad spectrum spf 30+ sunscreen to sun-exposed areas.   Hemangiomas - Red papules - Discussed benign nature - Observe - Call for any changes  Actinic Damage - Chronic condition, secondary to cumulative UV/sun exposure - diffuse scaly erythematous macules with underlying dyspigmentation - Recommend daily broad spectrum sunscreen SPF 30+ to sun-exposed areas, reapply every 2 hours as needed.  - Staying in the shade or wearing long sleeves, sun glasses (UVA+UVB protection) and wide brim hats (4-inch brim around the entire circumference of the hat) are also recommended for sun protection.  - Call for new or changing lesions.  Skin cancer screening performed today.  Inflamed seborrheic keratosis (  7) Face  Destruction of lesion - Face Complexity: simple   Destruction method: cryotherapy   Informed consent: discussed and consent obtained   Timeout:  patient name, date of birth, surgical site, and procedure verified Lesion destroyed using liquid nitrogen: Yes   Region frozen until ice ball  extended beyond lesion: Yes   Outcome: patient tolerated procedure well with no complications   Post-procedure details: wound care instructions given    AK (actinic keratosis) (2) Face  Destruction of lesion - Face Complexity: simple   Destruction method: cryotherapy   Informed consent: discussed and consent obtained   Timeout:  patient name, date of birth, surgical site, and procedure verified Lesion destroyed using liquid nitrogen: Yes   Region frozen until ice ball extended beyond lesion: Yes   Outcome: patient tolerated procedure well with no complications   Post-procedure details: wound care instructions given    Neoplasm of uncertain behavior of skin Mid Tip of Nose  Likely traumatic excoriation - recheck on follow up.  Idiopathic pulmonary fibrosis (HCC)  Currently Being treated with Prednisone  Skin cancer screening   Return in about 2 months (around 09/27/2021) for Recheck nose , AK follow up, ISK follow up.  I, Ashok Cordia, CMA, am acting as scribe for Sarina Ser, MD . Documentation: I have reviewed the above documentation for accuracy and completeness, and I agree with the above.  Sarina Ser, MD

## 2021-07-30 NOTE — Patient Instructions (Signed)

## 2021-07-31 ENCOUNTER — Encounter: Payer: Self-pay | Admitting: Dermatology

## 2021-10-02 ENCOUNTER — Ambulatory Visit: Payer: Medicare PPO | Admitting: Dermatology

## 2021-10-02 ENCOUNTER — Other Ambulatory Visit: Payer: Self-pay

## 2021-10-02 DIAGNOSIS — L820 Inflamed seborrheic keratosis: Secondary | ICD-10-CM | POA: Diagnosis not present

## 2021-10-02 DIAGNOSIS — L570 Actinic keratosis: Secondary | ICD-10-CM | POA: Diagnosis not present

## 2021-10-02 DIAGNOSIS — D485 Neoplasm of uncertain behavior of skin: Secondary | ICD-10-CM | POA: Diagnosis not present

## 2021-10-02 DIAGNOSIS — L578 Other skin changes due to chronic exposure to nonionizing radiation: Secondary | ICD-10-CM

## 2021-10-02 NOTE — Progress Notes (Signed)
? ?  Follow-Up Visit ?  ?Subjective  ?Joyce Evans is a 81 y.o. female who presents for the following: Follow-up (Recheck crust of mid tip of nose.) and Actinic Keratosis (2 month follow up of face treated with LN2). ?The patient has spots, moles and lesions to be evaluated, some may be new or changing and the patient has concerns that these could be cancer. ? ?The following portions of the chart were reviewed this encounter and updated as appropriate:  ? Tobacco  Allergies  Meds  Problems  Med Hx  Surg Hx  Fam Hx   ?  ?Review of Systems:  No other skin or systemic complaints except as noted in HPI or Assessment and Plan. ? ?Objective  ?Well appearing patient in no apparent distress; mood and affect are within normal limits. ? ?A focused examination was performed including face. Relevant physical exam findings are noted in the Assessment and Plan. ? ?Nose x 1, forehead x 9, left cheek x 1 (11) ?Erythematous thin papules/macules with gritty scale.  ? ?Rigth cheek x 4, left hand x 1 (5) ?Erythematous stuck-on, waxy papule or plaque ? ?Mid tip of nose ?Clear today ? ? ?Assessment & Plan  ? ?Actinic Damage ?- chronic, secondary to cumulative UV radiation exposure/sun exposure over time ?- diffuse scaly erythematous macules with underlying dyspigmentation ?- Recommend daily broad spectrum sunscreen SPF 30+ to sun-exposed areas, reapply every 2 hours as needed.  ?- Recommend staying in the shade or wearing long sleeves, sun glasses (UVA+UVB protection) and wide brim hats (4-inch brim around the entire circumference of the hat). ?- Call for new or changing lesions. ? ?AK (actinic keratosis) (11) ?Nose x 1, forehead x 9, left cheek x 1 ? ?Destruction of lesion - Nose x 1, forehead x 9, left cheek x 1 ?Complexity: simple   ?Destruction method: cryotherapy   ?Informed consent: discussed and consent obtained   ?Timeout:  patient name, date of birth, surgical site, and procedure verified ?Lesion destroyed using liquid  nitrogen: Yes   ?Region frozen until ice ball extended beyond lesion: Yes   ?Outcome: patient tolerated procedure well with no complications   ?Post-procedure details: wound care instructions given   ? ?Inflamed seborrheic keratosis (5) ?Rigth cheek x 4, left hand x 1 ? ?Destruction of lesion - Rigth cheek x 4, left hand x 1 ?Complexity: simple   ?Destruction method: cryotherapy   ?Informed consent: discussed and consent obtained   ?Timeout:  patient name, date of birth, surgical site, and procedure verified ?Lesion destroyed using liquid nitrogen: Yes   ?Region frozen until ice ball extended beyond lesion: Yes   ?Outcome: patient tolerated procedure well with no complications   ?Post-procedure details: wound care instructions given   ? ?Neoplasm of uncertain behavior of skin ?Mid tip of nose ?Resolved ?Observe. ? ?Return in about 6 months (around 04/04/2022) for AK follow up. ? ?I, Ashok Cordia, CMA, am acting as scribe for Sarina Ser, MD . ?Documentation: I have reviewed the above documentation for accuracy and completeness, and I agree with the above. ? ?Sarina Ser, MD ? ?

## 2021-10-02 NOTE — Patient Instructions (Signed)

## 2021-10-06 ENCOUNTER — Encounter: Payer: Self-pay | Admitting: Dermatology

## 2021-10-16 ENCOUNTER — Inpatient Hospital Stay: Payer: Medicare PPO

## 2021-10-16 ENCOUNTER — Other Ambulatory Visit: Payer: Self-pay

## 2021-10-16 ENCOUNTER — Emergency Department: Payer: Medicare PPO

## 2021-10-16 ENCOUNTER — Encounter: Payer: Self-pay | Admitting: Emergency Medicine

## 2021-10-16 ENCOUNTER — Inpatient Hospital Stay: Payer: Medicare PPO | Admitting: Anesthesiology

## 2021-10-16 ENCOUNTER — Inpatient Hospital Stay
Admission: EM | Admit: 2021-10-16 | Discharge: 2021-10-21 | DRG: 481 | Disposition: A | Discharge status: Skilled Nursing Facility | Payer: Medicare PPO | Attending: Internal Medicine | Admitting: Internal Medicine

## 2021-10-16 ENCOUNTER — Encounter
Admission: EM | Disposition: A | Discharge status: Skilled Nursing Facility | Payer: Self-pay | Source: Home / Self Care | Attending: Internal Medicine

## 2021-10-16 DIAGNOSIS — M19041 Primary osteoarthritis, right hand: Secondary | ICD-10-CM | POA: Diagnosis present

## 2021-10-16 DIAGNOSIS — M19012 Primary osteoarthritis, left shoulder: Secondary | ICD-10-CM | POA: Diagnosis present

## 2021-10-16 DIAGNOSIS — M19011 Primary osteoarthritis, right shoulder: Secondary | ICD-10-CM | POA: Diagnosis present

## 2021-10-16 DIAGNOSIS — W010XXA Fall on same level from slipping, tripping and stumbling without subsequent striking against object, initial encounter: Secondary | ICD-10-CM | POA: Diagnosis present

## 2021-10-16 DIAGNOSIS — Z803 Family history of malignant neoplasm of breast: Secondary | ICD-10-CM | POA: Diagnosis not present

## 2021-10-16 DIAGNOSIS — J84112 Idiopathic pulmonary fibrosis: Secondary | ICD-10-CM | POA: Diagnosis present

## 2021-10-16 DIAGNOSIS — S72141A Displaced intertrochanteric fracture of right femur, initial encounter for closed fracture: Secondary | ICD-10-CM

## 2021-10-16 DIAGNOSIS — Z85820 Personal history of malignant melanoma of skin: Secondary | ICD-10-CM | POA: Diagnosis not present

## 2021-10-16 DIAGNOSIS — E785 Hyperlipidemia, unspecified: Secondary | ICD-10-CM | POA: Diagnosis present

## 2021-10-16 DIAGNOSIS — Z7901 Long term (current) use of anticoagulants: Secondary | ICD-10-CM

## 2021-10-16 DIAGNOSIS — I4821 Permanent atrial fibrillation: Secondary | ICD-10-CM | POA: Diagnosis present

## 2021-10-16 DIAGNOSIS — Z953 Presence of xenogenic heart valve: Secondary | ICD-10-CM | POA: Diagnosis not present

## 2021-10-16 DIAGNOSIS — Z85828 Personal history of other malignant neoplasm of skin: Secondary | ICD-10-CM

## 2021-10-16 DIAGNOSIS — S72001A Fracture of unspecified part of neck of right femur, initial encounter for closed fracture: Secondary | ICD-10-CM | POA: Diagnosis not present

## 2021-10-16 DIAGNOSIS — I110 Hypertensive heart disease with heart failure: Secondary | ICD-10-CM | POA: Diagnosis present

## 2021-10-16 DIAGNOSIS — S72001D Fracture of unspecified part of neck of right femur, subsequent encounter for closed fracture with routine healing: Secondary | ICD-10-CM

## 2021-10-16 DIAGNOSIS — M19042 Primary osteoarthritis, left hand: Secondary | ICD-10-CM | POA: Diagnosis present

## 2021-10-16 DIAGNOSIS — K219 Gastro-esophageal reflux disease without esophagitis: Secondary | ICD-10-CM | POA: Diagnosis present

## 2021-10-16 DIAGNOSIS — Y92010 Kitchen of single-family (private) house as the place of occurrence of the external cause: Secondary | ICD-10-CM | POA: Diagnosis not present

## 2021-10-16 DIAGNOSIS — Z79899 Other long term (current) drug therapy: Secondary | ICD-10-CM

## 2021-10-16 DIAGNOSIS — J9611 Chronic respiratory failure with hypoxia: Secondary | ICD-10-CM | POA: Diagnosis present

## 2021-10-16 DIAGNOSIS — Z9981 Dependence on supplemental oxygen: Secondary | ICD-10-CM | POA: Diagnosis not present

## 2021-10-16 DIAGNOSIS — Z888 Allergy status to other drugs, medicaments and biological substances status: Secondary | ICD-10-CM | POA: Diagnosis not present

## 2021-10-16 DIAGNOSIS — D72829 Elevated white blood cell count, unspecified: Secondary | ICD-10-CM | POA: Diagnosis not present

## 2021-10-16 DIAGNOSIS — W19XXXA Unspecified fall, initial encounter: Principal | ICD-10-CM

## 2021-10-16 DIAGNOSIS — N179 Acute kidney failure, unspecified: Secondary | ICD-10-CM | POA: Diagnosis present

## 2021-10-16 DIAGNOSIS — R001 Bradycardia, unspecified: Secondary | ICD-10-CM | POA: Diagnosis present

## 2021-10-16 DIAGNOSIS — I5032 Chronic diastolic (congestive) heart failure: Secondary | ICD-10-CM | POA: Diagnosis present

## 2021-10-16 DIAGNOSIS — H409 Unspecified glaucoma: Secondary | ICD-10-CM | POA: Diagnosis present

## 2021-10-16 DIAGNOSIS — D62 Acute posthemorrhagic anemia: Secondary | ICD-10-CM | POA: Diagnosis not present

## 2021-10-16 HISTORY — PX: INTRAMEDULLARY (IM) NAIL INTERTROCHANTERIC: SHX5875

## 2021-10-16 LAB — RENAL FUNCTION PANEL
Albumin: 3.8 g/dL (ref 3.5–5.0)
Anion gap: 12 (ref 5–15)
BUN: 28 mg/dL — ABNORMAL HIGH (ref 8–23)
CO2: 26 mmol/L (ref 22–32)
Calcium: 9.3 mg/dL (ref 8.9–10.3)
Chloride: 99 mmol/L (ref 98–111)
Creatinine, Ser: 1.39 mg/dL — ABNORMAL HIGH (ref 0.44–1.00)
GFR, Estimated: 39 mL/min — ABNORMAL LOW (ref >60)
Glucose, Bld: 143 mg/dL — ABNORMAL HIGH (ref 70–99)
Phosphorus: 4.3 mg/dL (ref 2.5–4.6)
Potassium: 4 mmol/L (ref 3.5–5.1)
Sodium: 137 mmol/L (ref 135–145)

## 2021-10-16 LAB — BASIC METABOLIC PANEL
Anion gap: 9 (ref 5–15)
BUN: 27 mg/dL — ABNORMAL HIGH (ref 8–23)
CO2: 28 mmol/L (ref 22–32)
Calcium: 9.3 mg/dL (ref 8.9–10.3)
Chloride: 99 mmol/L (ref 98–111)
Creatinine, Ser: 1.33 mg/dL — ABNORMAL HIGH (ref 0.44–1.00)
GFR, Estimated: 41 mL/min — ABNORMAL LOW (ref >60)
Glucose, Bld: 149 mg/dL — ABNORMAL HIGH (ref 70–99)
Potassium: 4 mmol/L (ref 3.5–5.1)
Sodium: 136 mmol/L (ref 135–145)

## 2021-10-16 LAB — CBC
HCT: 36.4 % (ref 36.0–46.0)
Hemoglobin: 11.3 g/dL — ABNORMAL LOW (ref 12.0–15.0)
MCH: 29.4 pg (ref 26.0–34.0)
MCHC: 31 g/dL (ref 30.0–36.0)
MCV: 94.8 fL (ref 80.0–100.0)
Platelets: 261 10*3/uL (ref 150–400)
RBC: 3.84 MIL/uL — ABNORMAL LOW (ref 3.87–5.11)
RDW: 14.8 % (ref 11.5–15.5)
WBC: 11.2 10*3/uL — ABNORMAL HIGH (ref 4.0–10.5)
nRBC: 0 % (ref 0.0–0.2)

## 2021-10-16 LAB — PHOSPHORUS: Phosphorus: 4.3 mg/dL (ref 2.5–4.6)

## 2021-10-16 LAB — PROTIME-INR
INR: 1.1 (ref 0.8–1.2)
Prothrombin Time: 14.3 seconds (ref 11.4–15.2)

## 2021-10-16 LAB — SURGICAL PCR SCREEN
MRSA, PCR: NEGATIVE
Staphylococcus aureus: NEGATIVE

## 2021-10-16 LAB — MAGNESIUM: Magnesium: 2.2 mg/dL (ref 1.7–2.4)

## 2021-10-16 SURGERY — FIXATION, FRACTURE, INTERTROCHANTERIC, WITH INTRAMEDULLARY ROD
Anesthesia: General | Site: Hip | Laterality: Right

## 2021-10-16 MED ORDER — DEXMEDETOMIDINE (PRECEDEX) IN NS 20 MCG/5ML (4 MCG/ML) IV SYRINGE
PREFILLED_SYRINGE | INTRAVENOUS | Status: DC | PRN
  Administered 2021-10-16: 12 ug via INTRAVENOUS

## 2021-10-16 MED ORDER — DEXAMETHASONE SODIUM PHOSPHATE 10 MG/ML IJ SOLN
INTRAMUSCULAR | Status: AC
  Filled 2021-10-16: qty 1

## 2021-10-16 MED ORDER — CEFAZOLIN SODIUM-DEXTROSE 1-4 GM/50ML-% IV SOLN
1.0000 g | Freq: Three times a day (TID) | INTRAVENOUS | Status: AC
  Administered 2021-10-17 (×3): 1 g via INTRAVENOUS
  Filled 2021-10-16 (×3): qty 50

## 2021-10-16 MED ORDER — SUGAMMADEX SODIUM 200 MG/2ML IV SOLN
INTRAVENOUS | Status: DC | PRN
  Administered 2021-10-16: 200 mg via INTRAVENOUS

## 2021-10-16 MED ORDER — FENTANYL CITRATE (PF) 100 MCG/2ML IJ SOLN
INTRAMUSCULAR | Status: DC | PRN
  Administered 2021-10-16 (×4): 50 ug via INTRAVENOUS

## 2021-10-16 MED ORDER — LIDOCAINE HCL 1 % IJ SOLN
INTRAMUSCULAR | Status: DC | PRN
  Administered 2021-10-16: 30 mL via INTRADERMAL

## 2021-10-16 MED ORDER — EPHEDRINE SULFATE (PRESSORS) 50 MG/ML IJ SOLN
INTRAMUSCULAR | Status: DC | PRN
  Administered 2021-10-16: 5 mg via INTRAVENOUS

## 2021-10-16 MED ORDER — PROPOFOL 10 MG/ML IV BOLUS
INTRAVENOUS | Status: DC | PRN
  Administered 2021-10-16: 100 mg via INTRAVENOUS

## 2021-10-16 MED ORDER — ACETAMINOPHEN 325 MG PO TABS
650.0000 mg | ORAL_TABLET | Freq: Four times a day (QID) | ORAL | Status: DC | PRN
  Administered 2021-10-18: 650 mg via ORAL
  Filled 2021-10-16: qty 2

## 2021-10-16 MED ORDER — HYDROCODONE-ACETAMINOPHEN 5-325 MG PO TABS
1.0000 | ORAL_TABLET | Freq: Four times a day (QID) | ORAL | Status: DC | PRN
  Administered 2021-10-16: 1 via ORAL
  Administered 2021-10-17 – 2021-10-20 (×8): 2 via ORAL
  Filled 2021-10-16: qty 1
  Filled 2021-10-16 (×3): qty 2
  Filled 2021-10-16: qty 1
  Filled 2021-10-16 (×2): qty 2
  Filled 2021-10-16: qty 1
  Filled 2021-10-16 (×5): qty 2

## 2021-10-16 MED ORDER — ONDANSETRON HCL 4 MG/2ML IJ SOLN
4.0000 mg | Freq: Once | INTRAMUSCULAR | Status: AC
  Administered 2021-10-16: 4 mg via INTRAVENOUS
  Filled 2021-10-16: qty 2

## 2021-10-16 MED ORDER — ACETAMINOPHEN 10 MG/ML IV SOLN
INTRAVENOUS | Status: DC | PRN
  Administered 2021-10-16: 1000 mg via INTRAVENOUS

## 2021-10-16 MED ORDER — GLYCOPYRROLATE 0.2 MG/ML IJ SOLN
INTRAMUSCULAR | Status: DC | PRN
  Administered 2021-10-16: .2 mg via INTRAVENOUS

## 2021-10-16 MED ORDER — MORPHINE SULFATE (PF) 2 MG/ML IV SOLN: 0.5000 mg | INTRAVENOUS | Status: DC | PRN

## 2021-10-16 MED ORDER — ONDANSETRON HCL 4 MG/2ML IJ SOLN
INTRAMUSCULAR | Status: AC
  Filled 2021-10-16: qty 2

## 2021-10-16 MED ORDER — ROCURONIUM BROMIDE 100 MG/10ML IV SOLN
INTRAVENOUS | Status: DC | PRN
  Administered 2021-10-16: 50 mg via INTRAVENOUS

## 2021-10-16 MED ORDER — SENNOSIDES-DOCUSATE SODIUM 8.6-50 MG PO TABS
1.0000 | ORAL_TABLET | Freq: Two times a day (BID) | ORAL | Status: DC
  Administered 2021-10-17 – 2021-10-21 (×9): 1 via ORAL
  Filled 2021-10-16 (×9): qty 1

## 2021-10-16 MED ORDER — BRIMONIDINE TARTRATE 0.15 % OP SOLN
1.0000 [drp] | Freq: Two times a day (BID) | OPHTHALMIC | Status: DC
  Administered 2021-10-16 – 2021-10-21 (×10): 1 [drp] via OPHTHALMIC
  Filled 2021-10-16: qty 5

## 2021-10-16 MED ORDER — ACETAMINOPHEN 10 MG/ML IV SOLN: 1000.0000 mg | Freq: Once | INTRAVENOUS | Status: DC | PRN

## 2021-10-16 MED ORDER — OXYCODONE HCL 5 MG PO TABS
5.0000 mg | ORAL_TABLET | Freq: Once | ORAL | Status: AC | PRN
  Administered 2021-10-16: 5 mg via ORAL

## 2021-10-16 MED ORDER — OXYCODONE HCL 5 MG PO TABS
ORAL_TABLET | ORAL | Status: AC
  Filled 2021-10-16: qty 1

## 2021-10-16 MED ORDER — ONDANSETRON HCL 4 MG/2ML IJ SOLN
4.0000 mg | Freq: Once | INTRAMUSCULAR | Status: AC | PRN
  Administered 2021-10-16: 4 mg via INTRAVENOUS

## 2021-10-16 MED ORDER — ACETAMINOPHEN 10 MG/ML IV SOLN
INTRAVENOUS | Status: AC
  Filled 2021-10-16: qty 100

## 2021-10-16 MED ORDER — DEXMEDETOMIDINE HCL IN NACL 80 MCG/20ML IV SOLN
INTRAVENOUS | Status: AC
  Filled 2021-10-16: qty 20

## 2021-10-16 MED ORDER — ONDANSETRON HCL 4 MG/2ML IJ SOLN
4.0000 mg | Freq: Four times a day (QID) | INTRAMUSCULAR | Status: DC | PRN
  Administered 2021-10-16 (×2): 4 mg via INTRAVENOUS
  Filled 2021-10-16 (×2): qty 2

## 2021-10-16 MED ORDER — LOSARTAN POTASSIUM 50 MG PO TABS
50.0000 mg | ORAL_TABLET | Freq: Every day | ORAL | Status: DC
  Administered 2021-10-19: 50 mg via ORAL
  Filled 2021-10-16 (×4): qty 1

## 2021-10-16 MED ORDER — SODIUM CHLORIDE 0.9 % IV SOLN: INTRAVENOUS | Status: DC

## 2021-10-16 MED ORDER — HYDRALAZINE HCL 20 MG/ML IJ SOLN: 5.0000 mg | Freq: Four times a day (QID) | INTRAMUSCULAR | Status: DC | PRN

## 2021-10-16 MED ORDER — LIDOCAINE HCL (PF) 2 % IJ SOLN
INTRAMUSCULAR | Status: AC
Start: 2021-10-16 — End: ?
  Filled 2021-10-16: qty 5

## 2021-10-16 MED ORDER — FENTANYL CITRATE (PF) 100 MCG/2ML IJ SOLN
INTRAMUSCULAR | Status: AC
  Filled 2021-10-16: qty 2

## 2021-10-16 MED ORDER — FENTANYL CITRATE PF 50 MCG/ML IJ SOSY
50.0000 ug | PREFILLED_SYRINGE | Freq: Once | INTRAMUSCULAR | Status: AC
  Administered 2021-10-16: 50 ug via INTRAVENOUS
  Filled 2021-10-16: qty 1

## 2021-10-16 MED ORDER — ROCURONIUM BROMIDE 10 MG/ML (PF) SYRINGE
PREFILLED_SYRINGE | INTRAVENOUS | Status: AC
  Filled 2021-10-16: qty 10

## 2021-10-16 MED ORDER — HYDROMORPHONE HCL 1 MG/ML IJ SOLN
0.5000 mg | INTRAMUSCULAR | Status: DC | PRN
  Administered 2021-10-16 (×3): 0.5 mg via INTRAVENOUS
  Filled 2021-10-16 (×3): qty 1

## 2021-10-16 MED ORDER — OXYCODONE HCL 5 MG/5ML PO SOLN: 5.0000 mg | Freq: Once | ORAL | Status: AC | PRN

## 2021-10-16 MED ORDER — 0.9 % SODIUM CHLORIDE (POUR BTL) OPTIME
TOPICAL | Status: DC | PRN
  Administered 2021-10-16: 502 mL

## 2021-10-16 MED ORDER — ENSURE ENLIVE PO LIQD
237.0000 mL | Freq: Two times a day (BID) | ORAL | Status: DC
  Administered 2021-10-17 – 2021-10-21 (×8): 237 mL via ORAL

## 2021-10-16 MED ORDER — FENTANYL CITRATE (PF) 100 MCG/2ML IJ SOLN
25.0000 ug | INTRAMUSCULAR | Status: DC | PRN
  Administered 2021-10-16: 50 ug via INTRAVENOUS

## 2021-10-16 MED ORDER — FENTANYL CITRATE (PF) 100 MCG/2ML IJ SOLN
INTRAMUSCULAR | Status: AC
Start: 2021-10-16 — End: ?
  Filled 2021-10-16: qty 2

## 2021-10-16 MED ORDER — PHENYLEPHRINE HCL (PRESSORS) 10 MG/ML IV SOLN
INTRAVENOUS | Status: DC | PRN
  Administered 2021-10-16 (×2): 80 ug via INTRAVENOUS

## 2021-10-16 MED ORDER — AMLODIPINE BESYLATE 5 MG PO TABS
2.5000 mg | ORAL_TABLET | Freq: Every day | ORAL | Status: DC
Start: 2021-10-16 — End: 2021-10-21
  Administered 2021-10-19: 2.5 mg via ORAL
  Filled 2021-10-16 (×5): qty 1

## 2021-10-16 MED ORDER — FENTANYL CITRATE (PF) 100 MCG/2ML IJ SOLN
INTRAMUSCULAR | Status: AC
  Administered 2021-10-16: 50 ug via INTRAVENOUS
  Filled 2021-10-16: qty 2

## 2021-10-16 MED ORDER — ONDANSETRON HCL 4 MG/2ML IJ SOLN
INTRAMUSCULAR | Status: AC
Start: 2021-10-16 — End: 2021-10-17
  Filled 2021-10-16: qty 2

## 2021-10-16 MED ORDER — POLYETHYLENE GLYCOL 3350 17 G PO PACK
17.0000 g | PACK | Freq: Every day | ORAL | Status: DC | PRN
  Administered 2021-10-18 – 2021-10-19 (×2): 17 g via ORAL
  Filled 2021-10-16 (×2): qty 1

## 2021-10-16 MED ORDER — MUPIROCIN 2 % EX OINT
1.0000 "application " | TOPICAL_OINTMENT | Freq: Two times a day (BID) | CUTANEOUS | Status: AC
  Administered 2021-10-17 – 2021-10-20 (×6): 1 via NASAL
  Filled 2021-10-16 (×2): qty 22

## 2021-10-16 MED ORDER — CEFAZOLIN SODIUM 1 G IJ SOLR
INTRAMUSCULAR | Status: AC
  Filled 2021-10-16: qty 20

## 2021-10-16 MED ORDER — OXYCODONE HCL 5 MG PO TABS
5.0000 mg | ORAL_TABLET | ORAL | Status: DC | PRN
  Administered 2021-10-18 – 2021-10-21 (×8): 5 mg via ORAL
  Filled 2021-10-16 (×8): qty 1

## 2021-10-16 MED ORDER — LIDOCAINE HCL (CARDIAC) PF 100 MG/5ML IV SOSY
PREFILLED_SYRINGE | INTRAVENOUS | Status: DC | PRN
  Administered 2021-10-16: 50 mg via INTRAVENOUS

## 2021-10-16 MED ORDER — DEXAMETHASONE SODIUM PHOSPHATE 10 MG/ML IJ SOLN
INTRAMUSCULAR | Status: DC | PRN
  Administered 2021-10-16: 10 mg via INTRAVENOUS

## 2021-10-16 MED ORDER — CEFAZOLIN SODIUM-DEXTROSE 2-4 GM/100ML-% IV SOLN
2.0000 g | INTRAVENOUS | Status: AC
  Administered 2021-10-16: 2 g via INTRAVENOUS

## 2021-10-16 MED ORDER — LACTATED RINGERS IV SOLN: INTRAVENOUS | Status: DC

## 2021-10-16 MED ORDER — ONDANSETRON HCL 4 MG/2ML IJ SOLN
INTRAMUSCULAR | Status: DC | PRN
  Administered 2021-10-16: 4 mg via INTRAVENOUS

## 2021-10-16 MED ORDER — ADULT MULTIVITAMIN W/MINERALS CH
1.0000 | ORAL_TABLET | Freq: Every day | ORAL | Status: DC
  Administered 2021-10-17 – 2021-10-21 (×5): 1 via ORAL
  Filled 2021-10-16 (×5): qty 1

## 2021-10-16 MED ORDER — PANTOPRAZOLE SODIUM 40 MG PO TBEC
40.0000 mg | DELAYED_RELEASE_TABLET | Freq: Every day | ORAL | Status: DC
  Administered 2021-10-17 – 2021-10-21 (×5): 40 mg via ORAL
  Filled 2021-10-16 (×5): qty 1

## 2021-10-16 SURGICAL SUPPLY — 52 items
BIT DRILL INTERTAN LAG SCREW (BIT) ×1 IMPLANT
BIT DRILL SHORT 4.0 (BIT) IMPLANT
BNDG COHESIVE 6X5 TAN ST LF (GAUZE/BANDAGES/DRESSINGS) ×6 IMPLANT
CHLORAPREP W/TINT 26 (MISCELLANEOUS) ×2 IMPLANT
DRAPE 3/4 80X56 (DRAPES) ×4 IMPLANT
DRAPE C-ARM 42X72 X-RAY (DRAPES) ×2 IMPLANT
DRAPE SURG 17X11 SM STRL (DRAPES) ×4 IMPLANT
DRAPE U-SHAPE 47X51 STRL (DRAPES) ×2 IMPLANT
DRILL BIT SHORT 4.0 (BIT) ×5
DRSG OPSITE POSTOP 3X4 (GAUZE/BANDAGES/DRESSINGS) ×2 IMPLANT
DRSG OPSITE POSTOP 4X14 (GAUZE/BANDAGES/DRESSINGS) IMPLANT
DRSG OPSITE POSTOP 4X6 (GAUZE/BANDAGES/DRESSINGS) ×1 IMPLANT
ELECT REM PT RETURN 9FT ADLT (ELECTROSURGICAL) ×2
ELECTRODE REM PT RTRN 9FT ADLT (ELECTROSURGICAL) ×1 IMPLANT
GAUZE SPONGE 4X4 12PLY STRL (GAUZE/BANDAGES/DRESSINGS) ×1 IMPLANT
GAUZE XEROFORM 1X8 LF (GAUZE/BANDAGES/DRESSINGS) ×2 IMPLANT
GLOVE SURG ENC MOIS LTX SZ7.5 (GLOVE) ×3 IMPLANT
GLOVE SURG UNDER POLY LF SZ7.5 (GLOVE) ×3 IMPLANT
GOWN L4 XLG 20 PK N/S (GOWN DISPOSABLE) ×3 IMPLANT
GOWN STRL REUS W/ TWL XL LVL3 (GOWN DISPOSABLE) ×1 IMPLANT
GOWN STRL REUS W/TWL XL LVL3 (GOWN DISPOSABLE)
GUIDE PIN 3.2X343 (PIN) ×2
GUIDE PIN 3.2X343MM (PIN) ×2
GUIDE ROD 3.0 (MISCELLANEOUS) ×2
HEMOVAC 400CC 10FR (MISCELLANEOUS) IMPLANT
HOLDER FOLEY CATH W/STRAP (MISCELLANEOUS) ×2 IMPLANT
KIT TURNOVER CYSTO (KITS) ×2 IMPLANT
MANIFOLD NEPTUNE II (INSTRUMENTS) ×2 IMPLANT
MAT ABSORB  FLUID 56X50 GRAY (MISCELLANEOUS) ×1
MAT ABSORB FLUID 56X50 GRAY (MISCELLANEOUS) ×1 IMPLANT
NAIL RIGHT 10X38MM (Nail) ×1 IMPLANT
NS IRRIG 1000ML POUR BTL (IV SOLUTION) ×2 IMPLANT
PACK HIP COMPR (MISCELLANEOUS) ×2 IMPLANT
PAD ABD DERMACEA PRESS 5X9 (GAUZE/BANDAGES/DRESSINGS) ×1 IMPLANT
PAD ARMBOARD 7.5X6 YLW CONV (MISCELLANEOUS) ×2 IMPLANT
PIN GUIDE 3.2X343MM (PIN) IMPLANT
ROD GUIDE 3.0 (MISCELLANEOUS) IMPLANT
SCREW LAG COMPR KIT 95/90 (Screw) ×1 IMPLANT
SCREW TRIGEN LOW PROF 5.0X40 (Screw) ×1 IMPLANT
SCREW TRIGEN LOW PROF 5.0X42.5 (Screw) ×1 IMPLANT
STAPLER SKIN PROX 35W (STAPLE) ×2 IMPLANT
SUCTION FRAZIER HANDLE 10FR (MISCELLANEOUS) ×1
SUCTION TUBE FRAZIER 10FR DISP (MISCELLANEOUS) ×1 IMPLANT
SUT VIC AB 0 CT1 36 (SUTURE) ×3 IMPLANT
SUT VIC AB 2-0 CT1 (SUTURE) ×2 IMPLANT
SUT VIC AB 2-0 CT1 27 (SUTURE) ×1
SUT VIC AB 2-0 CT1 TAPERPNT 27 (SUTURE) ×1 IMPLANT
SUT VICRYL 0 AB UR-6 (SUTURE) ×1 IMPLANT
SYR 30ML LL (SYRINGE) ×2 IMPLANT
TAPE PAPER 1/2X10 TAN MEDIPORE (MISCELLANEOUS) ×1 IMPLANT
TRAY FOLEY SLVR 16FR LF STAT (SET/KITS/TRAYS/PACK) ×2 IMPLANT
WATER STERILE IRR 500ML POUR (IV SOLUTION) ×2 IMPLANT

## 2021-10-16 NOTE — ED Notes (Signed)
Richard Overbrook spouse given wedding band and purse  ?

## 2021-10-16 NOTE — ED Triage Notes (Signed)
Pt to ED from home c/o mechanical fall tonight over oxygen line.  Pain to right hip with shortening and outer rotation. 68mg fentanyl given en route by EMS.  Patient take Eliquis, denies hitting head or LOC.  Wears 3L [place] chronic for interstitial lung disease.  Pt A&Ox4, chest rise even and unlabored. ?

## 2021-10-16 NOTE — Op Note (Signed)
DATE OF SURGERY:  10/16/2021 ? ?TIME: 10:09 PM ? ?PATIENT NAME:  Joyce Evans ? ?AGE: 81 y.o. ? ?PRE-OPERATIVE DIAGNOSIS:  Right intertrochanteric hip fracture ? ?POST-OPERATIVE DIAGNOSIS:  SAME ? ?PROCEDURE: Right INTRAMEDULLARY (IM) NAIL INTERTROCHANTRIC ? ?SURGEON:  Renee Harder ? ?EBL:  300 cc ? ?COMPLICATIONS:  none ? ?OPERATIVE IMPLANTS: [employer] Intertan femoral nail 10 mm x 380 mm ? ?PREOPERATIVE INDICATIONS:  Joyce Evans is a 81 y.o. year old who fell and suffered a hip fracture. She was brought into the ER and then admitted and optimized and then elected for surgical intervention.   ? ?The risks benefits and alternatives were discussed with the patient including but not limited to the risks of nonoperative treatment, versus surgical intervention including infection, bleeding, nerve injury, malunion, nonunion, hardware prominence, hardware failure, need for hardware removal, blood clots, cardiopulmonary complications, morbidity, mortality, among others, and they were willing to proceed.   ? ?OPERATIVE PROCEDURE: ? ?The patient was brought to the operating room and placed in the supine position.  General anesthesia was administered, with a foley. She was placed on the fracture table.  Closed reduction was performed under C-arm guidance. The length of the femur was also measured using fluoroscopy. Time out was then performed after sterile prep and drape. She received preoperative antibiotics. ? ?Incision was made proximal to the greater trochanter. A guidewire was placed in the appropriate position. Confirmation was made on AP and lateral views. The above-named nail was opened. I opened the proximal femur with a reamer.  I then placed a guidepin and reamed up to 11.5 mm with good chatter.  I then placed the nail by hand easily down. I did not need to ream the femur. ? ?Once the nail was completely seated, I placed a guidepin into the femoral head into the center center position through a second  incision.  I measured the length, and then reamed the lateral cortex and up into the head. I then placed the two interlocking lag screws. Slight compression was applied. Anatomic fixation achieved.  2 distal interlocking screws were placed using perfect circle technique.  Bone quality was poor. ? ?I then secured the proximal interlocking screws.  I then removed the instruments, and took final C-arm pictures AP and lateral the entire length of the leg. Anatomic reconstruction was achieved, and the wounds were irrigated copiously and closed with Vicryl  followed by staples and dry sterile dressing. Sponge and needle count were correct.   The patient was awakened and returned to PACU in stable and satisfactory condition. There no complications and the patient tolerated the procedure well. ? ? ?POSTOPERATIVE PLAN: ?She will be weightbearing as tolerated.   ?Ok to resume anticoagulation on postop day 1 (Eliquis) ?Remove Foley on postop day 1 ?Dressing change by nursing staff as needed to keep dressing clean and dry ?Outpatient f/u in clinic in 2 weeks for staple removal and xrays ? ?Renee Harder ? ?

## 2021-10-16 NOTE — Progress Notes (Signed)
Brief hospitalist update note.  This is a nonbillable note.  Please see scanned H&P for full billable details. ? ?Briefly, this is a 81 year old female with history significant for interstitial lung disease, chronic hypoxic respiratory failure on 3 L nasal cannula, permanent atrial fibrillation anticoagulated on Eliquis, hypertension, hyperlipidemia who presents to ED after mechanical fall and subsequent severe right hip pain. ? ?Patient found to have a comminuted right hip fracture.  Orthopedics consulted from ED.  Plan for operative fixation. ? ?Perioperative risk assessment: ? ?Per Lyndel Safe perioperative risk assessment the estimated risk probability for perioperative myocardial infarction or cardiac arrest is 0.8%. ? ?Despite patient's interstitial lung disease and chronic hypoxic respiratory failure she is a low estimated risk of adverse events with noncardiac surgery ? ?Appreciate orthopedic assistance in management ? ?Ralene Muskrat MD ?

## 2021-10-16 NOTE — Transfer of Care (Signed)
Immediate Anesthesia Transfer of Care Note ? ?Patient: Joyce Evans ? ?Procedure(s) Performed: INTRAMEDULLARY (IM) NAIL INTERTROCHANTRIC (Right: Hip) ? ?Patient Location: PACU ? ?Anesthesia Type:General ? ?Level of Consciousness: sedated ? ?Airway & Oxygen Therapy: Patient Spontanous Breathing and Patient connected to face mask oxygen ? ?Post-op Assessment: Report given to RN and Post -op Vital signs reviewed and stable ? ?Post vital signs: Reviewed ? ?Last Vitals:  ?Vitals Value Taken Time  ?BP    ?Temp    ?Pulse 66 10/16/21 2218  ?Resp 15 10/16/21 2218  ?SpO2 98 % 10/16/21 2218  ?Vitals shown include unvalidated device data. ? ?Last Pain:  ?   ? ?  ? ?Complications: No notable events documented. ?

## 2021-10-16 NOTE — Consult Note (Signed)
?ORTHOPAEDIC CONSULTATION ? ?REQUESTING PHYSICIAN: Sidney Ace, MD ? ?Chief Complaint: right hip pain ? ?HPI: ?Joyce Evans is a 81 y.o. female who complains of right hip pain after a mechanical fall. The pain is sharp in character. Xrays show a displaced intertrochanteric hip fracture. Patient is accompanied by her husband. She has a walker and cane which she doesn't regularly use. ? ?PMH notable for interstitial lung disease, chronic hypoxia on 3 L nasal cannula continuously, permanent A-fib on Eliquis, GERD/gastritis, tricuspid regurgitation s/p repair, hypertension, hyperlipidemia, squamous cell skin cancer ? ?Past Medical History:  ?Diagnosis Date  ? Arthritis   ? osteo - shoulders, fingers  ? Atrial fibrillation ([place])   ? Dizziness   ? thinks because of diuretic  ? Family history of adverse reaction to anesthesia   ? daughter -PONV  ? Gallstones 2016, 2017  ? GERD (gastroesophageal reflux disease)   ? Glaucoma   ? Heart murmur   ? history of  ? Hypertension   ? Melanoma ([place]) 04/20/2016  ? 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 Excised: 05/19/2016, margins free.  ? Numbness in left leg   ? s/p hematoma  ? Sciatica   ? right  ? Seasonal allergies   ? Skin cancer   ? Squamous cell carcinoma of skin   ? R nasal labial  ? Tricuspid valve disorder   ? leak  ? ?Past Surgical History:  ?Procedure Laterality Date  ? ABDOMINAL HYSTERECTOMY  1987  ? partial  ? CARDIAC CATHETERIZATION  2010  ? [facility]  ? CATARACT EXTRACTION W/PHACO Right 08/28/2015  ? Procedure: CATARACT EXTRACTION PHACO AND INTRAOCULAR LENS PLACEMENT (IOC);  Surgeon: Leandrew Koyanagi, MD;  Location: [facility];  Service: Ophthalmology;  Laterality: Right;  TORIC  ? MELANOMA EXCISION Left 05/19/2016  ? MIS. Left mid. lat. tricep. Margins free  ? MOHS SURGERY  2013  ? TONSILLECTOMY  1971  ? ?Social History  ? ?Socioeconomic History  ? Marital status: Married  ?  Spouse name: Not on file  ? Number  of children: Not on file  ? Years of education: Not on file  ? Highest education level: Not on file  ?Occupational History  ? Not on file  ?Tobacco Use  ? Smoking status: Never  ? Smokeless tobacco: Never  ?Substance and Sexual Activity  ? Alcohol use: Yes  ?  Comment: rare  ? Drug use: Never  ? Sexual activity: Not on file  ?Other Topics Concern  ? Not on file  ?Social History Narrative  ? Not on file  ? ?Social Determinants of Health  ? ?Financial Resource Strain: Not on file  ?Food Insecurity: Not on file  ?Transportation Needs: Not on file  ?Physical Activity: Not on file  ?Stress: Not on file  ?Social Connections: Not on file  ? ?Family History  ?Problem Relation Age of Onset  ? Breast cancer Daughter 43  ? Breast cancer Paternal Aunt   ? ?Allergies  ?Allergen Reactions  ? Other Itching  ?  Phlebitis and itching at vein sites  ? Epinephrine Palpitations  ?  Heart races  ? Prevacid [Lansoprazole] Palpitations  ?  Heart races  ? ?Prior to Admission medications   ?Medication Sig Start Date End Date Taking? Authorizing Provider  ?acetaminophen (TYLENOL) 500 MG tablet Take 500 mg by mouth every 6 (six) hours as needed.   Yes [provider]  ?apixaban (ELIQUIS) 5 MG TABS tablet Take 5 mg by  mouth 2 (two) times daily.  01/04/18  Yes [provider]  ?Apple Cider Vinegar 300 MG TABS Take 1 tablet by mouth daily. 08/15/21  Yes [provider]  ?atorvastatin (LIPITOR) 40 MG tablet Take by mouth.   Yes [provider]  ?brimonidine (ALPHAGAN P) 0.1 % SOLN Place 1 drop into the right eye 2 (two) times daily.   Yes [provider]  ?Brinzolamide-Brimonidine 1-0.2 % SUSP Place 1 drop into the left eye 2 (two) times daily.   Yes [provider]  ?cetirizine (ZYRTEC) 10 MG tablet Take 10 mg by mouth daily. 05/08/20 10/16/21 Yes [provider]  ?Cholecalciferol 50 MCG (2000 UT) CAPS Take 3 capsules by mouth in the morning.   Yes [provider]  ?citalopram  (CELEXA) 20 MG tablet Take 10 mg by mouth daily. 10/02/21  Yes [provider]  ?famotidine (PEPCID) 40 MG tablet Take 40 mg by mouth at bedtime. 09/30/21  Yes [provider]  ?fluticasone (FLONASE) 50 MCG/ACT nasal spray Place 2 sprays into the nose daily. 05/08/20 10/16/21 Yes [provider]  ?furosemide (LASIX) 20 MG tablet Take 20 mg by mouth daily. 06/13/20 10/16/21 Yes [provider]  ?multivitamin-iron-minerals-folic acid (CENTRUM) chewable tablet Chew 1 tablet by mouth daily.   Yes [provider]  ?pantoprazole (PROTONIX) 40 MG tablet Take 40 mg by mouth daily. 09/23/21  Yes [provider]  ?predniSONE (DELTASONE) 1 MG tablet Take 1 tablet by mouth See admin instructions. Take 9 tablets (9 mg total) by mouth once daily for 14 days, THEN 8 tablets (8 mg total) once daily for 14 days, THEN 7 tablets (7 mg total) once daily for 14 days, THEN 6 tablets (6 mg total) once daily for 14 days, THEN 5 tablets (5 mg total) once daily for 14 days, THEN 4 tablets (4 mg total) once daily for 14 days, THEN 3 tablets (3 mg total) once daily for 14 days, THEN 2 tablets (2 mg total) once daily for 14 days, THEN 1 tablet (1 mg total) once daily for 14 days. 09/17/21  Yes [provider]  ?Probiotic Product (PROBIOTIC DAILY PO) Take by mouth daily.   Yes [provider]  ?timolol (BETIMOL) 0.5 % ophthalmic solution Place 1 drop into both eyes daily.   Yes [provider]  ?Jay Schlichter Oil (SYSTANE NIGHTTIME) OINT Apply 1 application. to eye at bedtime.   Yes [provider]  ?ZINC CITRATE PO Take 1 tablet by mouth 2 (two) times daily.   Yes [provider]  ?amLODipine (NORVASC) 2.5 MG tablet Take 1 tablet by mouth daily. ?Patient not taking: Reported on 10/16/2021 09/23/21   [provider]  ?amLODipine (NORVASC) 5 MG tablet Take 5 mg by mouth daily. ?Patient not taking: Reported on 10/16/2021 01/03/20   [provider]  ?atorvastatin (LIPITOR) 40 MG tablet Take 1 tablet (40 mg total) by mouth daily. ?Patient not taking: Reported on 10/16/2021 06/19/20 07/19/20  George Hugh, MD  ?azelastine (ASTELIN) 0.1 % nasal spray Place 2 sprays into both nostrils 2 (two) times daily. ?Patient not taking: Reported on 08/29/2020 01/09/20   [provider]  ?furosemide (LASIX) 20 MG tablet Take by mouth. ?Patient not taking: Reported on 08/29/2020 07/31/20 07/31/21  [provider]  ?losartan (COZAAR) 100 MG tablet Take 100 mg by mouth daily. ?Patient not taking: Reported on 10/16/2021 01/03/20   [provider]  ?losartan (COZAAR) 50 MG tablet Take 1 tablet by mouth  daily. ?Patient not taking: Reported on 10/16/2021 09/29/21   [provider]  ?omeprazole (PRILOSEC) 20 MG capsule Take 20 mg by mouth 2 (two) times daily. Reported on 08/28/2015 ?Patient not taking: Reported on 08/29/2020    [provider]  ?omeprazole (PRILOSEC) 40 MG capsule Take by mouth. ?Patient not taking: Reported on 10/16/2021    [provider]  ?spironolactone (ALDACTONE) 25 MG tablet Take 12.5 mg by mouth daily. ?Patient not taking: Reported on 08/29/2020 01/11/20   [provider]  ?Zinc Citrate-Phytase (ZYTAZE) 25-500 MG CAPS Take by mouth. ?Patient not taking: Reported on 10/16/2021    [provider]  ? ?[place] (5MM) ? ?Result Date: 10/16/2021 ?CLINICAL DATA:  Minor head trauma.  Mechanical fall. EXAM: CT HEAD WITHOUT CONTRAST TECHNIQUE: Contiguous axial images were obtained from the base of the skull through the vertex without intravenous contrast. RADIATION DOSE REDUCTION: This exam was performed according to the departmental dose-optimization program which includes automated exposure control, adjustment of the mA and/or kV according to patient size and/or use of iterative reconstruction technique. COMPARISON:  06/18/2020 FINDINGS: Brain: No evidence of acute infarction, hemorrhage,  hydrocephalus, extra-axial collection or mass lesion/mass effect. Patchy low-attenuation changes in the deep white matter are similar to prior study, likely small vessel ischemic changes. Vascular: Moderate intr

## 2021-10-16 NOTE — Progress Notes (Signed)
Admission profile updated. ?

## 2021-10-16 NOTE — H&P (Signed)
?History and Physical ? ?YITTEL EMRICH ELF:[id number] DOB: 03-09-1941 DOA: 10/16/2021 ? ?Referring physician: Dr. Leonides Schanz, [place]  ?PCP: Derinda Late, MD  ?Outpatient Specialists: Cardiology, pulmonology, GI, dermatology. ?Patient coming from: Home via EMS. ? ?Chief Complaint: Mechanical fall and right hip pain. ? ?HPI: Joyce Evans is a 81 y.o. female with medical history significant for interstitial lung disease, chronic hypoxia on 3 L nasal cannula continuously, permanent A-fib on Eliquis, GERD/gastritis, tricuspid regurgitation s/p repair, hypertension, hyperlipidemia, squamous cell skin cancer, who presented to [facility] ED after a mechanical fall at home and subsequent severe right hip pain.  Patient was in her usual state of health prior to this.  She was walking in her kitchen and tripped over her oxygen tubing and fell onto her right side.  No loss of consciousness.  No head trauma.  EMS was activated, and she was brought to the ED for further evaluation.  Work-up in the ED revealed right hip fracture.  Last dose of Eliquis was at 11 PM on 10/15/2021.  EDP discussed case with orthopedic surgery, Dr. Sharlet Salina, plan for surgical repair on 10/16/2021.  Patient admitted by hospitalist service, [facility]. ? ?ED Course: Temperature 97.8.  BP 162/65, heart rate 83, respiratory 17, saturation 92% on room air. ? ?Review of Systems: ?Review of systems as noted in the HPI. All other systems reviewed and are negative. ? ? ?Past Medical History:  ?Diagnosis Date  ? Arthritis   ? osteo - shoulders, fingers  ? Atrial fibrillation ([place])   ? Dizziness   ? thinks because of diuretic  ? Family history of adverse reaction to anesthesia   ? daughter -PONV  ? Gallstones 2016, 2017  ? GERD (gastroesophageal reflux disease)   ? Glaucoma   ? Heart murmur   ? history of  ? Hypertension   ? Melanoma ([place]) 04/20/2016  ? 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 Excised: 05/19/2016, margins free.  ? Numbness in  left leg   ? s/p hematoma  ? Sciatica   ? right  ? Seasonal allergies   ? Skin cancer   ? Squamous cell carcinoma of skin   ? R nasal labial  ? Tricuspid valve disorder   ? leak  ? ?Past Surgical History:  ?Procedure Laterality Date  ? ABDOMINAL HYSTERECTOMY  1987  ? partial  ? CARDIAC CATHETERIZATION  2010  ? [facility]  ? CATARACT EXTRACTION W/PHACO Right 08/28/2015  ? Procedure: CATARACT EXTRACTION PHACO AND INTRAOCULAR LENS PLACEMENT (IOC);  Surgeon: Leandrew Koyanagi, MD;  Location: [facility];  Service: Ophthalmology;  Laterality: Right;  TORIC  ? MELANOMA EXCISION Left 05/19/2016  ? MIS. Left mid. lat. tricep. Margins free  ? MOHS SURGERY  2013  ? TONSILLECTOMY  1971  ? ? ?Social History:  reports that she has never smoked. She has never used smokeless tobacco. She reports current alcohol use. She reports that she does not use drugs. ? ? ?Allergies  ?Allergen Reactions  ? Other Itching  ?  Phlebitis and itching at vein sites  ? Epinephrine Palpitations  ?  Heart races  ? Prevacid [Lansoprazole] Palpitations  ?  Heart races  ? ? ?Family History  ?Problem Relation Age of Onset  ? Breast cancer Daughter 62  ? Breast cancer Paternal Aunt   ?  ? ? ?Prior to Admission medications   ?Medication Sig Start Date End Date Taking? Authorizing Provider  ?acetaminophen (TYLENOL) 500 MG tablet Take 500 mg by mouth  every 6 (six) hours as needed.    [provider]  ?amLODipine (NORVASC) 5 MG tablet Take 5 mg by mouth daily. 01/03/20   [provider]  ?apixaban (ELIQUIS) 5 MG TABS tablet Take 5 mg by mouth 2 (two) times daily.  01/04/18   [provider]  ?atorvastatin (LIPITOR) 40 MG tablet Take 1 tablet (40 mg total) by mouth daily. 06/19/20 07/19/20  George Hugh, MD  ?atorvastatin (LIPITOR) 40 MG tablet Take by mouth.    [provider]  ?azelastine (ASTELIN) 0.1 % nasal spray Place 2 sprays into both nostrils 2 (two) times daily. ?Patient not taking: Reported on 08/29/2020 01/09/20    [provider]  ?brimonidine (ALPHAGAN P) 0.1 % SOLN Place 1 drop into the right eye 2 (two) times daily.    [provider]  ?Brinzolamide-Brimonidine 1-0.2 % SUSP Place 1 drop into the left eye 2 (two) times daily.    [provider]  ?cetirizine (ZYRTEC) 10 MG tablet Take 10 mg by mouth daily. ?Patient not taking: Reported on 08/29/2020 05/08/20 05/08/21  [provider]  ?Cholecalciferol (VITAMIN D3) 3000 units TABS Take 3,000-6,000 Units by mouth 2 (two) times daily.     [provider]  ?Cholecalciferol 50 MCG (2000 UT) CAPS Take by mouth.    [provider]  ?fluticasone (FLONASE) 50 MCG/ACT nasal spray Place 2 sprays into the nose daily. 05/08/20 05/08/21  [provider]  ?furosemide (LASIX) 20 MG tablet Take 20 mg by mouth daily. 06/13/20 06/13/21  [provider]  ?furosemide (LASIX) 20 MG tablet Take by mouth. ?Patient not taking: Reported on 08/29/2020 07/31/20 07/31/21  [provider]  ?losartan (COZAAR) 100 MG tablet Take 100 mg by mouth daily. 01/03/20   [provider]  ?multivitamin-iron-minerals-folic acid (CENTRUM) chewable tablet Chew 1 tablet by mouth daily.    [provider]  ?omeprazole (PRILOSEC) 20 MG capsule Take 20 mg by mouth 2 (two) times daily. Reported on 08/28/2015 ?Patient not taking: Reported on 08/29/2020    [provider]  ?omeprazole (PRILOSEC) 40 MG capsule Take by mouth.    [provider]  ?Probiotic Product (PROBIOTIC DAILY PO) Take by mouth daily.    [provider]  ?spironolactone (ALDACTONE) 25 MG tablet Take 12.5 mg by mouth daily. ?Patient not taking: Reported on 08/29/2020 01/11/20   [provider]  ?timolol (BETIMOL) 0.5 % ophthalmic solution Place 1 drop into both eyes daily.    [provider]  ?Zinc Citrate-Phytase (ZYTAZE) 25-500 MG CAPS Take by mouth.    [provider]  ? ? ?Physical Exam: ?BP (!) 152/77   Pulse (!)  55   Temp 97.8 ?F (36.6 ?C) (Oral)   Resp 15   Ht '5\' 1"'$  (1.549 m) Comment: Simultaneous filing. User may not have seen previous data.  Wt 68.9 kg Comment: Simultaneous filing. User may not have seen previous data.  SpO2 95%   BMI 28.72 kg/m?  ? ?General: 81 y.o. year-old female well developed well nourished in no acute distress.  Alert and oriented x3. ?Cardiovascular: Irregular rate and rhythm with no rubs or gallops.  No thyromegaly or JVD noted.  Trace lower extremity edema. 2/4 pulses in all 4 extremities. ?Respiratory: Clear to auscultation with no wheezes or rales. Good inspiratory effort. ?Abdomen: Soft nontender nondistended with normal bowel sounds x4 quadrants. ?Muskuloskeletal: No cyanosis or clubbing.  Trace edema noted bilaterally ?Neuro: CN II-XII intact, strength, sensation, reflexes ?Skin: No ulcerative lesions noted  or rashes ?Psychiatry: Judgement and insight appear normal. Mood is appropriate for condition and setting ?   ?   ?   ?Labs on Admission:  ?Basic Metabolic Panel: ?Recent Labs  ?Lab 10/16/21 ?0114  ?NA 137  136  ?K 4.0  4.0  ?CL 99  99  ?CO2 26  28  ?GLUCOSE 143*  149*  ?BUN 28*  27*  ?CREATININE 1.39*  1.33*  ?CALCIUM 9.3  9.3  ?MG 2.2  ?PHOS 4.3  4.3  ? ?Liver Function Tests: ?Recent Labs  ?Lab 10/16/21 ?0114  ?ALBUMIN 3.8  ? ?No results for input(s): LIPASE, AMYLASE in the last 168 hours. ?No results for input(s): AMMONIA in the last 168 hours. ?CBC: ?Recent Labs  ?Lab 10/16/21 ?0114  ?WBC 11.2*  ?HGB 11.3*  ?HCT 36.4  ?MCV 94.8  ?PLT 261  ? ?Cardiac Enzymes: ?No results for input(s): CKTOTAL, CKMB, CKMBINDEX, TROPONINI in the last 168 hours. ? ?BNP (last 3 results) ?No results for input(s): BNP in the last 8760 hours. ? ?ProBNP (last 3 results) ?No results for input(s): PROBNP in the last 8760 hours. ? ?CBG: ?No results for input(s): GLUCAP in the last 168 hours. ? ?Radiological Exams on Admission: ?CT HEAD WO CONTRAST (5MM) ? ?Result Date: 10/16/2021 ?CLINICAL DATA:   Minor head trauma.  Mechanical fall. EXAM: CT HEAD WITHOUT CONTRAST TECHNIQUE: Contiguous axial images were obtained from the base of the skull through the vertex without intravenous contrast. RADIATION DOSE REDUCTION: Thi

## 2021-10-16 NOTE — ED Notes (Signed)
Report given to OR.

## 2021-10-16 NOTE — Anesthesia Preprocedure Evaluation (Addendum)
Anesthesia Evaluation  ?Patient identified by MRN, date of birth, ID band ?Patient awake ? ? ? ?Reviewed: ?Allergy & Precautions, NPO status , Patient's Chart, lab work & pertinent test results ? ?History of Anesthesia Complications ?Negative for: history of anesthetic complications ? ?Airway ?Mallampati: III ? ? ?Neck ROM: Full ? ? ? Dental ?no notable dental hx. ? ?  ?Pulmonary ? ?Idiopathic interstitial lung disease on 3L home O2 continuously ?  ?Pulmonary exam normal ?breath sounds clear to auscultation ? ? ? ? ? ? Cardiovascular ?hypertension, + dysrhythmias (a fib on Eliquis, last dose 10/15/21 in PM) + Valvular Problems/Murmurs (TR s/p TVR 2019)  ?Rhythm:Irregular Rate:Normal ?+ Diastolic murmurs ?ECG 10/16/21:  ?Atrial fibrillation (HR 51) ?Nonspecific intraventricular conduction delay ?Lateral infarct, age indeterminate ?Anteroseptal infarct, old ?No significant change since last tracing ? ?Echo 06/18/21:  ???MILD LV DYSFUNCTION WITH MODERATE LVH  ???MILD RV SYSTOLIC DYSFUNCTION   ???VALVULAR REGURGITATION: MILD AR, MILD MR, MILD PR, MILD TR  ???PROSTHETIC VALVE(S): BIOPROSTHETIC TV  ???ARRHYTHMIA THROUGHOUT EXAM. ?  ?Neuro/Psych ?PSYCHIATRIC DISORDERS Anxiety TIA (01/2021, residual right leg numbness)  ? GI/Hepatic ?GERD  ,  ?Endo/Other  ?negative endocrine ROS ? Renal/GU ?negative Renal ROS  ? ?  ?Musculoskeletal ? ?(+) Arthritis , Melanoma   ? Abdominal ?  ?Peds ? Hematology ?negative hematology ROS ?(+)   ?Anesthesia Other Findings ?Cardiology note 09/03/21:  ?ASSESSMENT/PLAN  ?Joyce Evans is a 81 y.o. female with a past medical history of moderate-severe TR c/b RV enlargement s/p 31 mm Medtronic Mosaic bioprosthetic TVR (02/01/2018; [place]), chronic atrial fibrillation (on Eliquis), HTN, idiopathic pulmonary fibrosis on O2 3L with exertion, who returns to clinic for evaluation and management of dizziness and elevated BP. ? ?Diagnoses and all orders for this visit: ? ?Chronic  heart failure with preserved ejection fraction (CMS-HCC) ?Severe tricuspid regurgitation, s/p TV replacement in July 2019 with 31 mm Medtronic Mosaic bioprosthetic valve ?Primary hypertension ?She reports constant dizziness today. I do not think this is related to labile BP. Her BP has actually been very well controlled over the past few months at clinic visits and I've asked her to stop checking so much at home. I would hate to treat a falsely high BP at home and make her more hypotension. She will discuss the possibility of vertigo with her PCP to see if meclizine would be a good option. She is euvolemic on exam. She will continue carvedilol 6.25 mg BID and lasix 20 mg daily. She will return to clinic to see me in 3 months for routine follow-up. She knows to contact me in the interim with questions, concerns, or new issues.  ? ?Chronic atrial fibrillation (CMS-HCC) ?Rate controlled. Continue Eliquis 5 mg BID for chronic anticoagulation given elevated CHA2DS2-VASc score of 5 equating to 6.7% ischemic stroke rate per year (positive for CHF (1), HTN (1), Age > 5 (2) and Female (1). She will continue carvedilol for rate control.  ? ?Return in about 3 months (around 12/01/2021) for Pearletha Alfred, NP.  ? ? Reproductive/Obstetrics ? ?  ? ? ? ? ? ? ? ? ? ? ? ? ? ?  ?  ? ? ? ? ? ? ? ?Anesthesia Physical ?Anesthesia Plan ? ?ASA: 4 ? ?Anesthesia Plan: General  ? ?Post-op Pain Management:   ? ?Induction: Intravenous ? ?PONV Risk Score and Plan: 3 and Ondansetron, Dexamethasone and Treatment may vary due to age or medical condition ? ?Airway Management Planned: Oral ETT ? ?Additional Equipment:  ? ?Intra-op Plan:  ? ?  Post-operative Plan: Extubation in OR ? ?Informed Consent: I have reviewed the patients History and Physical, chart, labs and discussed the procedure including the risks, benefits and alternatives for the proposed anesthesia with the patient or authorized representative who has indicated his/her understanding and  acceptance.  ? ? ? ?Dental advisory given ? ?Plan Discussed with: CRNA ? ?Anesthesia Plan Comments: (Patient consented for risks of anesthesia including but not limited to:  ?- adverse reactions to medications ?- damage to eyes, teeth, lips or other oral mucosa ?- nerve damage due to positioning  ?- sore throat or hoarseness ?- damage to heart, brain, nerves, lungs, other parts of body or loss of life ? ?Informed patient about role of CRNA in peri- and intra-operative care.  Patient voiced understanding.)  ? ? ? ? ? ? ?Anesthesia Quick Evaluation ? ?

## 2021-10-16 NOTE — ED Notes (Signed)
Messaged provider regarding pt HR being in the low 40's and pt requesting pain meds. Sreenath, MD states to give meds as long as BP is systolic greater than 184.  ?

## 2021-10-16 NOTE — ED Provider Notes (Signed)
? ?[facility] ?Provider Note ? ? ? Event Date/Time  ? First MD Initiated Contact with Patient 10/16/21 0053   ?  (approximate) ? ? ?History  ? ?Fall and Hip Pain ? ? ?HPI ? ?Joyce Evans is a 81 y.o. female history of atrial fibrillation on Eliquis, hypertension, hyperlipidemia, CHF with preserved ejection fraction, severe tricuspid regurgitation status post bioprosthetic TVR in 2019, interstitial lung disease/pulmonary fibrosis on 3 L of oxygen chronically who presents to the emergency department EMS with complaints of right hip pain.  Patient states that she was walking in her kitchen and tripped over her oxygen tubing and fell onto her right side.  She does not think she hit her head but is not completely sure.  No loss of consciousness.  No headache, neck or back pain.  Was given 50 mcg of fentanyl in route.  States her pain went from a 10/10 and is now a 4/10.  She denies any preceding chest pain, palpitations, shortness of breath, dizziness that led to her fall. ? ?States she took her last dose of Eliquis at 80 PM tonight (10/15/21).   ? ?History provided by patient and EMS. ? ? ? ?Past Medical History:  ?Diagnosis Date  ? Arthritis   ? osteo - shoulders, fingers  ? Atrial fibrillation ([place])   ? Dizziness   ? thinks because of diuretic  ? Family history of adverse reaction to anesthesia   ? daughter -PONV  ? Gallstones 2016, 2017  ? GERD (gastroesophageal reflux disease)   ? Glaucoma   ? Heart murmur   ? history of  ? Hypertension   ? Melanoma ([place]) 04/20/2016  ? 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 Excised: 05/19/2016, margins free.  ? Numbness in left leg   ? s/p hematoma  ? Sciatica   ? right  ? Seasonal allergies   ? Skin cancer   ? Squamous cell carcinoma of skin   ? R nasal labial  ? Tricuspid valve disorder   ? leak  ? ? ?Past Surgical History:  ?Procedure Laterality Date  ? ABDOMINAL HYSTERECTOMY  1987  ? partial  ? CARDIAC  CATHETERIZATION  2010  ? [facility]  ? CATARACT EXTRACTION W/PHACO Right 08/28/2015  ? Procedure: CATARACT EXTRACTION PHACO AND INTRAOCULAR LENS PLACEMENT (IOC);  Surgeon: Leandrew Koyanagi, MD;  Location: [place];  Service: Ophthalmology;  Laterality: Right;  TORIC  ? MELANOMA EXCISION Left 05/19/2016  ? MIS. Left mid. lat. tricep. Margins free  ? MOHS SURGERY  2013  ? TONSILLECTOMY  1971  ? ? ?MEDICATIONS:  ?Prior to Admission medications   ?Medication Sig Start Date End Date Taking? Authorizing Provider  ?acetaminophen (TYLENOL) 500 MG tablet Take 500 mg by mouth every 6 (six) hours as needed.    [provider]  ?amLODipine (NORVASC) 5 MG tablet Take 5 mg by mouth daily. 01/03/20   [provider]  ?apixaban (ELIQUIS) 5 MG TABS tablet Take 5 mg by mouth 2 (two) times daily.  01/04/18   [provider]  ?atorvastatin (LIPITOR) 40 MG tablet Take 1 tablet (40 mg total) by mouth daily. 06/19/20 07/19/20  George Hugh, MD  ?atorvastatin (LIPITOR) 40 MG tablet Take by mouth.    [provider]  ?azelastine (ASTELIN) 0.1 % nasal spray Place 2 sprays into both nostrils 2 (two) times daily. ?Patient not taking: Reported on 08/29/2020 01/09/20   [provider]  ?brimonidine (ALPHAGAN P) 0.1 %  SOLN Place 1 drop into the right eye 2 (two) times daily.    [provider]  ?Brinzolamide-Brimonidine 1-0.2 % SUSP Place 1 drop into the left eye 2 (two) times daily.    [provider]  ?cetirizine (ZYRTEC) 10 MG tablet Take 10 mg by mouth daily. ?Patient not taking: Reported on 08/29/2020 05/08/20 05/08/21  [provider]  ?Cholecalciferol (VITAMIN D3) 3000 units TABS Take 3,000-6,000 Units by mouth 2 (two) times daily.     [provider]  ?Cholecalciferol 50 MCG (2000 UT) CAPS Take by mouth.    [provider]  ?fluticasone (FLONASE) 50 MCG/ACT nasal spray Place 2 sprays into the nose daily. 05/08/20 05/08/21  [provider]   ?furosemide (LASIX) 20 MG tablet Take 20 mg by mouth daily. 06/13/20 06/13/21  [provider]  ?furosemide (LASIX) 20 MG tablet Take by mouth. ?Patient not taking: Reported on 08/29/2020 07/31/20 07/31/21  [provider]  ?losartan (COZAAR) 100 MG tablet Take 100 mg by mouth daily. 01/03/20   [provider]  ?multivitamin-iron-minerals-folic acid (CENTRUM) chewable tablet Chew 1 tablet by mouth daily.    [provider]  ?omeprazole (PRILOSEC) 20 MG capsule Take 20 mg by mouth 2 (two) times daily. Reported on 08/28/2015 ?Patient not taking: Reported on 08/29/2020    [provider]  ?omeprazole (PRILOSEC) 40 MG capsule Take by mouth.    [provider]  ?Probiotic Product (PROBIOTIC DAILY PO) Take by mouth daily.    [provider]  ?spironolactone (ALDACTONE) 25 MG tablet Take 12.5 mg by mouth daily. ?Patient not taking: Reported on 08/29/2020 01/11/20   [provider]  ?timolol (BETIMOL) 0.5 % ophthalmic solution Place 1 drop into both eyes daily.    [provider]  ?Zinc Citrate-Phytase (ZYTAZE) 25-500 MG CAPS Take by mouth.    [provider]  ? ? ?Physical Exam  ? ?Triage Vital Signs: ?ED Triage Vitals  ?Enc Vitals Group  ?   BP 10/16/21 0058 (!) 169/75  ?   Pulse Rate 10/16/21 0058 (!) 55  ?   Resp 10/16/21 0058 15  ?   Temp 10/16/21 0059 97.8 ?F (36.6 ?C)  ?   Temp Source 10/16/21 0059 Oral  ?   SpO2 10/16/21 0058 96 %  ?   Weight 10/16/21 0059 152 lb (68.9 kg)  ?   Height 10/16/21 0059 '5\' 1"'$  (1.549 m)  ?   Head Circumference --   ?   Peak Flow --   ?   Pain Score 10/16/21 0058 6  ?   Pain Loc --   ?   Pain Edu? --   ?   Excl. in [place]? --   ? ? ?Most recent vital signs: ?Vitals:  ? 10/16/21 0058 10/16/21 0059  ?BP: (!) 169/75   ?Pulse: (!) 55   ?Resp: 15   ?Temp:  97.8 ?F (36.6 ?C)  ?SpO2: 96%   ? ? ? ?CONSTITUTIONAL: Alert and oriented and responds appropriately to questions. Well-appearing; well-nourished; GCS 15  elderly ?HEAD: Normocephalic; atraumatic ?EYES: Conjunctivae clear, PERRL, EOMI ?ENT: normal nose; no rhinorrhea; moist mucous membranes; pharynx without lesions noted; no dental injury; no septal hematoma, no epistaxis; no facial deformity or bony tenderness ?NECK: Supple, no midline spinal tenderness, step-off or deformity; trachea midline ?CARD: RRR; S1 and S2 appreciated; no murmurs, no clicks, no rubs, no gallops ?RESP: Normal chest excursion without splinting or tachypnea; breath sounds clear and equal bilaterally; no wheezes, no rhonchi,  no rales; no hypoxia or respiratory distress ?CHEST:  chest wall stable, no crepitus or ecchymosis or deformity, nontender to palpation; no flail chest ?ABD/GI: Normal bowel sounds; non-distended; soft, non-tender, no rebound, no guarding; no ecchymosis or other lesions noted ?PELVIS:  stable ?BACK:  The back appears normal; no midline spinal tenderness, step-off or deformity ?EXT: Patient is tender to palpation over the right lateral hip.  Leg is shortened and externally rotated.  2+ DP pulses bilaterally.  Normal sensation throughout both legs.  Compartments are soft.  No tenderness over the knee, tibia-fibula, ankle or foot on the right. ?SKIN: Normal color for age and race; warm ?NEURO: No facial asymmetry, normal speech, moving all extremities equally ? ?ED Results / Procedures / Treatments  ? ?LABS: ?(all labs ordered are listed, but only abnormal results are displayed) ?Labs Reviewed  ?CBC - Abnormal; Notable for the following components:  ?    Result Value  ? WBC 11.2 (*)   ? RBC 3.84 (*)   ? Hemoglobin 11.3 (*)   ? All other components within normal limits  ?BASIC METABOLIC PANEL - Abnormal; Notable for the following components:  ? Glucose, Bld 149 (*)   ? BUN 27 (*)   ? Creatinine, Ser 1.33 (*)   ? GFR, Estimated 41 (*)   ? All other components within normal limits  ?PROTIME-INR  ?CBC  ?RENAL FUNCTION PANEL  ?MAGNESIUM  ?PHOSPHORUS  ?TYPE AND SCREEN  ? ? ? ?EKG: ?  EKG Interpretation ? ?Date/Time:  Thursday October 16 2021 00:57:15 EDT ?Ventricular Rate:  51 ?PR Interval:    ?QRS Duration: 132 ?QT Interval:  486 ?QTC Calculation: 448 ?R Axis:   154 ?Text Interpretation: Atrial fibrillation

## 2021-10-16 NOTE — Anesthesia Procedure Notes (Signed)
Procedure Name: Intubation ?Date/Time: 10/16/2021 8:31 PM ?Performed by: Rolla Plate, CRNA ?Pre-anesthesia Checklist: Patient identified, Patient being monitored, Timeout performed, Emergency Drugs available and Suction available ?Patient Re-evaluated:Patient Re-evaluated prior to induction ?Oxygen Delivery Method: Circle system utilized ?Preoxygenation: Pre-oxygenation with 100% oxygen ?Induction Type: IV induction ?Ventilation: Mask ventilation without difficulty ?Laryngoscope Size: 3 and McGraph ?Grade View: Grade I ?Tube type: Oral ?Tube size: 7.0 mm ?Number of attempts: 1 ?Airway Equipment and Method: Stylet and Video-laryngoscopy ?Placement Confirmation: ETT inserted through vocal cords under direct vision, positive ETCO2 and breath sounds checked- equal and bilateral ?Secured at: 20 cm ?Tube secured with: Tape ?Dental Injury: Teeth and Oropharynx as per pre-operative assessment  ? ? ? ? ?

## 2021-10-16 NOTE — Progress Notes (Signed)
Initial Nutrition Assessment ? ?DOCUMENTATION CODES:  ? ?Not applicable ? ?INTERVENTION:  ? ?-Once diet is advanced, add:  ? ?-Ensure Enlive po BID, each supplement provides 350 kcal and 20 grams of protein ?-MVI with minerals daily ? ?NUTRITION DIAGNOSIS:  ? ?Increased nutrient needs related to post-op healing as evidenced by estimated needs. ? ?GOAL:  ? ?Patient will meet greater than or equal to 90% of their needs ? ?MONITOR:  ? ?PO intake, Supplement acceptance, Diet advancement, Labs, Weight trends, Skin, I & O's ? ?REASON FOR ASSESSMENT:  ? ?Consult ?Assessment of nutrition requirement/status, Hip fracture protocol ? ?ASSESSMENT:  ? ?Joyce Evans is a 81 y.o. female with medical history significant for interstitial lung disease, chronic hypoxia on 3 L nasal cannula continuously, permanent A-fib on Eliquis, GERD/gastritis, tricuspid regurgitation s/p repair, hypertension, hyperlipidemia, squamous cell skin cancer, who presented to [facility] ED after a mechanical fall at home and subsequent severe right hip pain.  Patient was in her usual state of health prior to this.  She was walking in her kitchen and tripped over her oxygen tubing and fell onto her right side.  No loss of consciousness.  No head trauma.  EMS was activated, and she was brought to the ED for further evaluation.  Work-up in the ED revealed right hip fracture.  Last dose of Eliquis was at 11 PM on 10/15/2021.  EDP discussed case with orthopedic surgery, Dr. Sharlet Salina, plan for surgical repair on 10/16/2021.  Patient admitted by hospitalist service, [facility]. ? ?Pt admitted with rt hip fracture s/p mechanical fall.  ? ?Reviewed I/O's: +146 ml x 24 hours ? ?Pt unavailable at time of visit. RD unable to obtain further nutrition-related history or complete nutrition-focused physical exam at this time.   ?  ?Per orthopedics notes, plan for surgery today. Pt is currently NPO for procedure.  ? ?Reviewed wt hx; pt has experienced a 6.3% wt loss over the past 10  months, which is not significant for time frame.  ? ?Pt with increased nutritional needs and would benefit from addition of oral nutritional supplements.  ? ?Medications reviewed and include senokot and lactated ringers infusion @ 50 ml/hr.   ? ?Lab Results  ?Component Value Date  ? HGBA1C 5.4 06/17/2020  ? PTA DM medications are none.  ? ?Labs reviewed: CBGS: 116 (inpatient orders for glycemic control are none).   ? ?Diet Order:   ?Diet Order   ? ?       ?  Diet NPO time specified  Diet effective now       ?  ? ?  ?  ? ?  ? ? ?EDUCATION NEEDS:  ? ?No education needs have been identified at this time ? ?Skin:  Skin Assessment: Reviewed RN Assessment ? ?Last BM:  Unknown ? ?Height:  ? ?Ht Readings from Last 1 Encounters:  ?10/16/21 '5\' 1"'$  (1.549 m)  ? ? ?Weight:  ? ?Wt Readings from Last 1 Encounters:  ?10/16/21 68.9 kg  ? ? ?Ideal Body Weight:  47.7 kg ? ?BMI:  Body mass index is 28.72 kg/m?. ? ?Estimated Nutritional Needs:  ? ?Kcal:  1500-1700 ? ?Protein:  80-95 grams ? ?Fluid:  > 1.5 L ? ? ? ?Loistine Chance, RD, LDN, CDCES ?Registered Dietitian II ?Certified Diabetes Care and Education Specialist ?Please refer to [facility] for RD and/or RD on-call/weekend/after hours pager  ?

## 2021-10-16 NOTE — ED Notes (Signed)
Informed RN bed assigned 

## 2021-10-17 DIAGNOSIS — S72001D Fracture of unspecified part of neck of right femur, subsequent encounter for closed fracture with routine healing: Secondary | ICD-10-CM | POA: Diagnosis not present

## 2021-10-17 LAB — CBC
HCT: 30.9 % — ABNORMAL LOW (ref 36.0–46.0)
Hemoglobin: 9.4 g/dL — ABNORMAL LOW (ref 12.0–15.0)
MCH: 29.4 pg (ref 26.0–34.0)
MCHC: 30.4 g/dL (ref 30.0–36.0)
MCV: 96.6 fL (ref 80.0–100.0)
Platelets: 212 10*3/uL (ref 150–400)
RBC: 3.2 MIL/uL — ABNORMAL LOW (ref 3.87–5.11)
RDW: 14.2 % (ref 11.5–15.5)
WBC: 15 10*3/uL — ABNORMAL HIGH (ref 4.0–10.5)
nRBC: 0 % (ref 0.0–0.2)

## 2021-10-17 MED ORDER — PREDNISONE 5 MG PO TABS
8.0000 mg | ORAL_TABLET | Freq: Every day | ORAL | Status: AC
  Administered 2021-10-17 – 2021-10-18 (×2): 8 mg via ORAL
  Filled 2021-10-17 (×2): qty 3

## 2021-10-17 MED ORDER — BRINZOLAMIDE 1 % OP SUSP
1.0000 [drp] | Freq: Three times a day (TID) | OPHTHALMIC | Status: DC
Start: 2021-10-17 — End: 2021-10-21
  Administered 2021-10-17 – 2021-10-21 (×13): 1 [drp] via OPHTHALMIC
  Filled 2021-10-17: qty 10

## 2021-10-17 MED ORDER — APIXABAN 5 MG PO TABS
5.0000 mg | ORAL_TABLET | Freq: Two times a day (BID) | ORAL | Status: DC
  Administered 2021-10-17 – 2021-10-21 (×9): 5 mg via ORAL
  Filled 2021-10-17 (×9): qty 1

## 2021-10-17 MED ORDER — FAMOTIDINE 20 MG PO TABS
40.0000 mg | ORAL_TABLET | Freq: Every day | ORAL | Status: DC
  Administered 2021-10-17 – 2021-10-20 (×4): 40 mg via ORAL
  Filled 2021-10-17 (×4): qty 2

## 2021-10-17 MED ORDER — TIMOLOL MALEATE 0.5 % OP SOLN
1.0000 [drp] | Freq: Every day | OPHTHALMIC | Status: DC
  Administered 2021-10-17 – 2021-10-21 (×5): 1 [drp] via OPHTHALMIC
  Filled 2021-10-17: qty 5

## 2021-10-17 MED ORDER — ATORVASTATIN CALCIUM 20 MG PO TABS
40.0000 mg | ORAL_TABLET | Freq: Every day | ORAL | Status: DC
  Administered 2021-10-18 – 2021-10-21 (×4): 40 mg via ORAL
  Filled 2021-10-17 (×5): qty 2

## 2021-10-17 MED ORDER — FUROSEMIDE 20 MG PO TABS
20.0000 mg | ORAL_TABLET | Freq: Every day | ORAL | Status: DC
  Administered 2021-10-17 – 2021-10-21 (×5): 20 mg via ORAL
  Filled 2021-10-17 (×5): qty 1

## 2021-10-17 MED ORDER — PANTOPRAZOLE SODIUM 40 MG PO TBEC: 40.0000 mg | DELAYED_RELEASE_TABLET | Freq: Every day | ORAL | Status: DC

## 2021-10-17 MED ORDER — BRIMONIDINE TARTRATE 0.2 % OP SOLN
1.0000 [drp] | Freq: Three times a day (TID) | OPHTHALMIC | Status: DC
Start: 2021-10-17 — End: 2021-10-21
  Administered 2021-10-17 – 2021-10-21 (×13): 1 [drp] via OPHTHALMIC
  Filled 2021-10-17: qty 5

## 2021-10-17 MED ORDER — CITALOPRAM HYDROBROMIDE 10 MG PO TABS
10.0000 mg | ORAL_TABLET | Freq: Every day | ORAL | Status: DC
  Administered 2021-10-17 – 2021-10-21 (×5): 10 mg via ORAL
  Filled 2021-10-17 (×5): qty 1

## 2021-10-17 MED ORDER — CARVEDILOL 3.125 MG PO TABS
6.2500 mg | ORAL_TABLET | Freq: Two times a day (BID) | ORAL | Status: DC
  Administered 2021-10-17 – 2021-10-21 (×8): 6.25 mg via ORAL
  Filled 2021-10-17 (×9): qty 2

## 2021-10-17 MED ORDER — RISAQUAD PO CAPS
1.0000 | ORAL_CAPSULE | Freq: Every day | ORAL | Status: DC
  Administered 2021-10-19 – 2021-10-21 (×3): 1 via ORAL
  Filled 2021-10-17 (×5): qty 1

## 2021-10-17 NOTE — TOC Progression Note (Signed)
Transition of Care (TOC) - Progression Note  ? ? ?Patient Details  ?Name: Joyce Evans ?MRN: [id number] ?Date of Birth: [birth year] ? ?Transition of Care (TOC) CM/SW Contact  ?Conception Oms, RN ?Phone Number: ?10/17/2021, 11:26 AM ? ?Clinical Narrative:   The patient will need to go to STR and is agreeable to a bed search,  ? ? ? ?  ?  ? ?Expected Discharge Plan and Services ?  ?  ?  ?  ?  ?                ?  ?  ?  ?  ?  ?  ?  ?  ?  ?  ? ? ?Social Determinants of Health (SDOH) Interventions ?  ? ?Readmission Risk Interventions ?   ? View : No data to display.  ?  ?  ?  ? ? ?

## 2021-10-17 NOTE — TOC Progression Note (Signed)
Transition of Care (TOC) - Progression Note  ? ? ?Patient Details  ?Name: Joyce Evans ?MRN: [id number] ?Date of Birth: 1941-04-05 ? ?Transition of Care (TOC) CM/SW Contact  ?Conception Oms, RN ?Phone Number: ?10/17/2021, 4:10 PM ? ?Clinical Narrative:    ?Met with the patient o review the bed choice, She chose [facility], TOC to start Ins auth on Sunday for DC Monday, [facility] notified ? ? ?Expected Discharge Plan: [place] ?Barriers to Discharge: SNF Pending bed offer, Insurance Authorization, Continued Medical Work up ? ?Expected Discharge Plan and Services ?Expected Discharge Plan: [place] ?  ?  ?  ?  ?                ?  ?  ?  ?  ?  ?  ?  ?  ?  ?  ? ? ?Social Determinants of Health (SDOH) Interventions ?  ? ?Readmission Risk Interventions ?   ? View : No data to display.  ?  ?  ?  ? ? ?

## 2021-10-17 NOTE — Plan of Care (Signed)

## 2021-10-17 NOTE — Evaluation (Signed)
Physical Therapy Evaluation ?Patient Details ?Name: Joyce Evans ?MRN: [id number] ?DOB: Apr 28, 1941 ?Today's Date: 10/17/2021 ? ?History of Present Illness ? Joyce Evans is a 81 y.o. female with medical history significant for interstitial lung disease, chronic hypoxia on 3 L nasal cannula continuously, permanent A-fib on Eliquis, GERD/gastritis, tricuspid regurgitation s/p repair, hypertension, hyperlipidemia, squamous cell skin cancer, who presented to [facility] ED after a mechanical fall at home and subsequent severe right hip pain. Pt found to have a R intertrochanteric hip fracture and is now s/p R IM nailing. WBAT. ? ?  ?Clinical Impression ? Pt received in Semi-Fowler's position and reluctant to work with therapy.  With maximal encouragement pt agreed to therapy session.  Pt able to assist with transition to sitting, but required modA to come to the EOB.  Pt then required modA to stand upright and was only able to do so for ~5 seconds before needing to sit.  The second attempt she was able to stand for longer period of time, ~30 sec, while case worker asked questions.  Pt then started becoming non-verbal and skin color became pale.  Pt assisted back to the bed with assistance from case worker and therapist.  Pt advised in pursed lip breathing and was able to come to fairly quickly.  Pt then transitioned to supine with maxA from therapist for the LE navigation into the bed.  Pt then transferred back to bed with all needs met and call bell within reach.  Nursing notified of incident, orthostatics will likely need to be assessed at next transfer.  Current discharge plans to SNF are appropriate at this time.  Pt will continue to benefit from skilled therapy in order to address deficits listed below. ? ?   ? ?Recommendations for follow up therapy are one component of a multi-disciplinary discharge planning process, led by the attending physician.  Recommendations may be updated based on patient status, additional  functional criteria and insurance authorization. ? ?Follow Up Recommendations Skilled nursing-short term rehab (<3 hours/day) ? ?  ?Assistance Recommended at Discharge Frequent or constant Supervision/Assistance  ?Patient can return home with the following ? A little help with walking and/or transfers;A little help with bathing/dressing/bathroom ? ?  ?Equipment Recommendations Other (comment) (TBD at next venue of care.)  ?Recommendations for Other Services ?    ?  ?Functional Status Assessment Patient has had a recent decline in their functional status and demonstrates the ability to make significant improvements in function in a reasonable and predictable amount of time.  ? ?  ?Precautions / Restrictions Precautions ?Precautions: Fall ?Restrictions ?Weight Bearing Restrictions: Yes ?RLE Weight Bearing: Weight bearing as tolerated  ? ?  ? ?Mobility ? Bed Mobility ?Overal bed mobility: Needs Assistance ?Bed Mobility: Supine to Sit, Sit to Supine ?  ?  ?Supine to sit: Mod assist, HOB elevated ?Sit to supine: Max assist ?  ?General bed mobility comments: Pt requires MaxA for LE's due to pain ?  ? ?Transfers ?Overall transfer level: Needs assistance ?Equipment used: Rolling walker (2 wheels) ?Transfers: Sit to/from Stand ?Sit to Stand: Mod assist ?  ?  ?  ?  ?  ?General transfer comment: Pt requires modA for STS x3.  Passed out on the first attempt. ?  ? ?Ambulation/Gait ?  ?  ?  ?  ?  ?  ?  ?General Gait Details: deferred ? ?Stairs ?  ?  ?  ?  ?  ? ?Wheelchair Mobility ?  ? ?Modified Rankin (Stroke Patients  Only) ?  ? ?  ? ?Balance Overall balance assessment: Needs assistance ?Sitting-balance support: Single extremity supported, No upper extremity supported, Feet supported ?Sitting balance-Leahy Scale: Good ?Sitting balance - Comments: Able to progress to static sitting at EOB without UE support. Initial L lateral lean for weight shift off of RLE. ?  ?  ?Standing balance-Leahy Scale: Poor ?Standing balance comment: Pt  requires B UE for support when in standing. ?  ?  ?  ?  ?  ?  ?  ?  ?  ?  ?  ?   ? ? ? ?Pertinent Vitals/Pain Pain Assessment ?Pain Assessment: Faces ?Faces Pain Scale: Hurts whole lot ?Pain Location: RLE with mobility attempts. ?Pain Descriptors / Indicators: Other (Comment), Grimacing, Stabbing, Guarding, Shooting, Sharp ?Pain Intervention(s): Limited activity within patient's tolerance, Monitored during session, Premedicated before session, Repositioned  ? ? ?Home Living Family/patient expects to be discharged to:: Private residence ?Living Arrangements: Spouse/significant other ?Available Help at Discharge: Family;Available 24 hours/day ?Type of Home: House ?Home Access: Other (comment) ([place]) ?  ?  ?  ?Home Layout: One level ?Home Equipment: Rollator (4 wheels);Cane - single point;Adaptive equipment ?Additional Comments: Pt on 3L O2 consistently at baseline. Intermittently requires inrease with activity.  ?  ?Prior Function Prior Level of Function : Independent/Modified Independent;History of Falls (last six months) ?  ?  ?  ?  ?  ?  ?Mobility Comments: Generally independent with functional mobility. using [id number] for longer distances in the community ?ADLs Comments: Independent, pt reports using her exercise bike "almost daily" and does not require physical assistance for ADL/IADL management. ?  ? ? ?Hand Dominance  ? Dominant Hand: Right ? ?  ?Extremity/Trunk Assessment  ? Upper Extremity Assessment ?Upper Extremity Assessment: Overall WFL for tasks assessed ?  ? ?Lower Extremity Assessment ?Lower Extremity Assessment: Generalized weakness;RLE deficits/detail ?RLE Deficits / Details: s/p R IM nail, WBAT ?RLE: Unable to fully assess due to pain ?RLE Sensation: decreased light touch;history of peripheral neuropathy (pt endorses hx of peripheral neuropathy bilat, increased in RLE today) ?RLE Coordination: decreased gross motor;decreased fine motor ?  ? ?   ?Communication  ? Communication: No difficulties   ?Cognition Arousal/Alertness: Awake/alert ?Behavior During Therapy: [facility] for tasks assessed/performed ?Overall Cognitive Status: Within Functional Limits for tasks assessed ?  ?  ?  ?  ?  ?  ?  ?  ?  ?  ?  ?  ?  ?  ?  ?  ?General Comments: Pt reluctant to participate due to pain. ?  ?  ? ?  ?General Comments   ? ?  ?Exercises    ? ?Assessment/Plan  ?  ?PT Assessment Patient needs continued PT services  ?PT Problem List Decreased strength;Decreased range of motion;Decreased activity tolerance;Decreased balance;Decreased mobility;Decreased knowledge of use of DME;Decreased safety awareness;Pain ? ?   ?  ?PT Treatment Interventions DME instruction;Gait training;Stair training;Functional mobility training;Therapeutic activities;Therapeutic exercise;Balance training;Neuromuscular re-education   ? ?PT Goals (Current goals can be found in the Care Plan section)  ?Acute Rehab PT Goals ?Patient Stated Goal: to decrease pain ?PT Goal Formulation: With patient ?Time For Goal Achievement: 10/31/21 ?Potential to Achieve Goals: Fair ? ?  ?Frequency BID ?  ? ? ?Co-evaluation   ?  ?  ?  ?  ? ? ?  ?AM-PAC PT "6 Clicks" Mobility  ?Outcome Measure Help needed turning from your back to your side while in a flat bed without using bedrails?: A Lot ?Help needed moving from lying  on your back to sitting on the side of a flat bed without using bedrails?: A Lot ?Help needed moving to and from a bed to a chair (including a wheelchair)?: A Lot ?Help needed standing up from a chair using your arms (e.g., wheelchair or bedside chair)?: A Lot ?Help needed to walk in hospital room?: Total ?Help needed climbing 3-5 steps with a railing? : Total ?6 Click Score: 10 ? ?  ?End of Session Equipment Utilized During Treatment: Oxygen ?Activity Tolerance: Patient limited by pain ?Patient left: in bed;with call bell/phone within reach;with bed alarm set;with SCD's reapplied ?Nurse Communication: Mobility status ?PT Visit Diagnosis: Unsteadiness on feet  (R26.81);Other abnormalities of gait and mobility (R26.89);Muscle weakness (generalized) (M62.81);History of falling (Z91.81);Difficulty in walking, not elsewhere classified (R26.2);Pain ?Pain - Right/Left: Right

## 2021-10-17 NOTE — [place] FL2 (Signed)
?[place] MEDICAID FL2 LEVEL OF CARE SCREENING TOOL  ?  ? ?IDENTIFICATION  ?Patient Name: ?Joyce Evans Birthdate: January 06, 1941 Sex: female Admission Date (Current Location): ?10/16/2021  ?[place] and [place] Number: ? [place] ?  Facility and Address:  ?[facility], [address] [place],  [address] ?     Provider Number: ?[id number]  ?Attending Physician Name and Address:  ?Sidney Ace, MD ? Relative Name and Phone Number:  ?Delfino Lovett husband [phone] ?   ?Current Level of Care: ?Hospital Recommended Level of Care: ?[place] Prior Approval Number: ?  ? ?Date Approved/Denied: ?  PASRR Number: ?[id number] A ? ?Discharge Plan: ?SNF ?  ? ?Current Diagnoses: ?Patient Active Problem List  ? Diagnosis Date Noted  ? Closed right hip fracture ([place]) 10/16/2021  ? TIA (transient ischemic attack) 06/17/2020  ? Gallstones   ? ? ?Orientation RESPIRATION BLADDER Height & Weight   ?  ?Self, Time, Situation, Place ? Normal, O2 (3 liters) Continent Weight: 68.9 kg (Simultaneous filing. User may not have seen previous data.) ?Height:  '5\' 1"'$  (154.9 cm) (Simultaneous filing. User may not have seen previous data.)  ?BEHAVIORAL SYMPTOMS/MOOD NEUROLOGICAL BOWEL NUTRITION STATUS  ?    Continent    ?AMBULATORY STATUS COMMUNICATION OF NEEDS Skin   ?Extensive Assist Verbally Normal, Surgical wounds ?  ?  ?  ?    ?     ?     ? ? ?Personal Care Assistance Level of Assistance  ?Bathing, Feeding, Dressing Bathing Assistance: Maximum assistance ?Feeding assistance: Limited assistance ?Dressing Assistance: Maximum assistance ?   ? ?Functional Limitations Info  ?    ?  ?   ? ? ?SPECIAL CARE FACTORS FREQUENCY  ?PT (By licensed PT), OT (By licensed OT)   ?  ?PT Frequency: 5 times per week ?OT Frequency: 5 times per week ?  ?  ?  ?   ? ? ?Contractures Contractures Info: Not present  ? ? ?Additional Factors Info  ?Code Status, Allergies Code Status Info: Full code ?Allergies Info: Other,  Epinephrine, Prevacid (Lansoprazole) ?  ?  ?  ?   ? ?Current Medications (10/17/2021):  This is the current hospital active medication list ?Current Facility-Administered Medications  ?Medication Dose Route Frequency Provider Last Rate Last Admin  ? acetaminophen (TYLENOL) tablet 650 mg  650 mg Oral Q6H PRN Renee Harder, MD      ? acidophilus (RISAQUAD) capsule 1 capsule  1 capsule Oral Daily Sreenath, Sudheer B, MD      ? amLODipine (NORVASC) tablet 2.5 mg  2.5 mg Oral Daily Renee Harder, MD      ? apixaban Arne Cleveland) tablet 5 mg  5 mg Oral BID Ralene Muskrat B, MD   5 mg at 10/17/21 0388  ? atorvastatin (LIPITOR) tablet 40 mg  40 mg Oral Daily Sreenath, Sudheer B, MD      ? brimonidine (ALPHAGAN) 0.15 % ophthalmic solution 1 drop  1 drop Right Eye BID Renee Harder, MD   1 drop at 10/17/21 0913  ? brinzolamide (AZOPT) 1 % ophthalmic suspension 1 drop  1 drop Left Eye TID Ralene Muskrat B, MD   1 drop at 10/17/21 0931  ? And  ? brimonidine (ALPHAGAN) 0.2 % ophthalmic solution 1 drop  1 drop Left Eye TID Ralene Muskrat B, MD   1 drop at 10/17/21 0932  ? carvedilol (COREG) tablet 6.25 mg  6.25 mg Oral BID WC Ralene Muskrat B, MD   6.25 mg at 10/17/21  5035  ? ceFAZolin (ANCEF) IVPB 1 g/50 mL premix  1 g Intravenous Essie Hart, MD 100 mL/hr at 10/17/21 0405 1 g at 10/17/21 0405  ? citalopram (CELEXA) tablet 10 mg  10 mg Oral Daily Ralene Muskrat B, MD   10 mg at 10/17/21 0912  ? famotidine (PEPCID) tablet 40 mg  40 mg Oral QHS Sreenath, Sudheer B, MD      ? feeding supplement (ENSURE ENLIVE / ENSURE PLUS) liquid 237 mL  237 mL Oral BID BM Renee Harder, MD   237 mL at 10/17/21 0913  ? furosemide (LASIX) tablet 20 mg  20 mg Oral Daily Ralene Muskrat B, MD   20 mg at 10/17/21 4656  ? hydrALAZINE (APRESOLINE) injection 5 mg  5 mg Intravenous Q6H PRN Renee Harder, MD      ? HYDROcodone-acetaminophen (NORCO/VICODIN) 5-325 MG per tablet 1-2 tablet  1-2 tablet Oral Q6H PRN  Renee Harder, MD   2 tablet at 10/17/21 1205  ? HYDROmorphone (DILAUDID) injection 0.5 mg  0.5 mg Intravenous Q4H PRN Renee Harder, MD   0.5 mg at 10/16/21 1547  ? lactated ringers infusion   Intravenous Continuous Renee Harder, MD 50 mL/hr at 10/17/21 0259 New Bag at 10/17/21 0259  ? losartan (COZAAR) tablet 50 mg  50 mg Oral Daily Renee Harder, MD      ? multivitamin with minerals tablet 1 tablet  1 tablet Oral Daily Renee Harder, MD   1 tablet at 10/17/21 0911  ? mupirocin ointment (BACTROBAN) 2 % 1 application.  1 application. Nasal BID Renee Harder, MD      ? ondansetron [facility]) injection 4 mg  4 mg Intravenous Q6H PRN Renee Harder, MD   4 mg at 10/16/21 1552  ? oxyCODONE (Oxy IR/ROXICODONE) immediate release tablet 5 mg  5 mg Oral Q4H PRN Renee Harder, MD      ? pantoprazole (PROTONIX) EC tablet 40 mg  40 mg Oral Daily Renee Harder, MD   40 mg at 10/17/21 0816  ? polyethylene glycol (MIRALAX / GLYCOLAX) packet 17 g  17 g Oral Daily PRN Renee Harder, MD      ? predniSONE (DELTASONE) tablet 8 mg  8 mg Oral Q breakfast Ralene Muskrat B, MD   8 mg at 10/17/21 0913  ? senna-docusate (Senokot-S) tablet 1 tablet  1 tablet Oral BID Renee Harder, MD   1 tablet at 10/17/21 0912  ? timolol (TIMOPTIC) 0.5 % ophthalmic solution 1 drop  1 drop Both Eyes Daily Ralene Muskrat B, MD   1 drop at 10/17/21 0914  ? ? ? ?Discharge Medications: ?Please see discharge summary for a list of discharge medications. ? ?Relevant Imaging Results: ? ?Relevant Lab Results: ? ? ?Additional Information ?SS# [id number] ? ?Conception Oms, RN ? ? ? ? ?

## 2021-10-17 NOTE — TOC Initial Note (Signed)
Transition of Care (TOC) - Initial/Assessment Note  ? ? ?Patient Details  ?Name: Joyce Evans ?MRN: [id number] ?Date of Birth: 1941-03-06 ? ?Transition of Care (TOC) CM/SW Contact:    ?Conception Oms, RN ?Phone Number: ?10/17/2021, 10:28 AM ? ?Clinical Narrative:                 ? ?81 y.o. female with medical history significant for interstitial lung disease, chronic hypoxia on 3 L nasal cannula continuously, permanent A-fib on Eliquis, GERD/gastritis, tricuspid regurgitation s/p repair, hypertension, hyperlipidemia, squamous cell skin cancer ? ?Had surgery yesterday, PT to eval ?TOC to follow for needs and assist with DC ?  ?  ? ? ?Patient Goals and CMS Choice ?  ?  ?  ? ?Expected Discharge Plan and Services ?  ?  ?  ?  ?  ?                ?  ?  ?  ?  ?  ?  ?  ?  ?  ?  ? ?Prior Living Arrangements/Services ?  ?  ?  ?       ?  ?  ?  ?  ? ?Activities of Daily Living ?Home Assistive Devices/Equipment: Oxygen, Walker (specify type), Cane (specify quad or straight) ?ADL Screening (condition at time of admission) ?Patient's cognitive ability adequate to safely complete daily activities?: Yes ?Is the patient deaf or have difficulty hearing?: No ?Does the patient have difficulty seeing, even when wearing glasses/contacts?: No ?Does the patient have difficulty concentrating, remembering, or making decisions?: No ?Patient able to express need for assistance with ADLs?: Yes ?Does the patient have difficulty dressing or bathing?: No ?Independently performs ADLs?: Yes (appropriate for developmental age) ?Does the patient have difficulty walking or climbing stairs?: No ?Weakness of Legs: None ?Weakness of Arms/Hands: None ? ?Permission Sought/Granted ?  ?  ?   ?   ?   ?   ? ?Emotional Assessment ?  ?  ?  ?  ?  ?  ? ?Admission diagnosis:  AKI (acute kidney injury) ([place]) [N17.9] ?Closed right hip fracture ([place]) [S72.001A] ?Fall, initial encounter [W19.XXXA] ?Closed intertrochanteric fracture of hip, right, initial encounter  ([place]) [S72.141A] ?Patient Active Problem List  ? Diagnosis Date Noted  ? Closed right hip fracture ([place]) 10/16/2021  ? TIA (transient ischemic attack) 06/17/2020  ? Gallstones   ? ?PCP:  Derinda Late, MD ?Pharmacy:   ?[id number] [id number]?Phone: [phone]: [phone]? ? ? ? ?Social Determinants of Health (SDOH) Interventions ?  ? ?Readmission Risk Interventions ?   ? View : No data to display.  ?  ?  ?  ? ? ? ?

## 2021-10-17 NOTE — Progress Notes (Signed)
?PROGRESS NOTE ? ? ? ?DLISA BARNWELL  ZHY:[id number] DOB: [birth year] DOA: 10/16/2021 ?PCP: Derinda Late, MD  ? ? ?Brief Narrative:  ?81 year old female with history significant for interstitial lung disease, chronic hypoxic respiratory failure on 3 L nasal cannula, permanent atrial fibrillation anticoagulated on Eliquis, hypertension, hyperlipidemia who presents to ED after mechanical fall and subsequent severe right hip pain. ?  ?Patient found to have a comminuted right hip fracture.  Orthopedics consulted from ED.  Plan for operative fixation. ?  ?Perioperative risk assessment: ?  ?Per Lyndel Safe perioperative risk assessment the estimated risk probability for perioperative myocardial infarction or cardiac arrest is 0.8%. ? ?4/7: Patient status post IM nail for intertrochanteric hip fracture.  Tolerated procedure well.  No immediate complications.  Postoperative pain mild to moderate. ? ? ?Assessment & Plan: ?  ?Principal Problem: ?  Closed right hip fracture ([place]) ? ?Right hip fracture status post mechanical fall, POA ?Presented to the ED from home via EMS after tripping on her oxygen tubing. ?Right hip fracture seen on x-ray ?Last dose of Eliquis 11 PM 10/15/2021 ?EDP discussed case with Dr. Sharlet Salina, orthopedic surgery ?Status post IM nail 4/6 ?Plan: ?Start PT and OT ?Weightbearing as tolerated ?DC IV fluids ?As needed pain control ?Restart Eliquis for DVT prophylaxis and anticoagulation ?As needed pain control ?  ?Acute blood loss anemia in the setting of bone fracture ?Baseline hemoglobin appears to be 13 ?Presented with hemoglobin of 11.3. ?No obvious signs of blood loss ?No indication for transfusion ?  ?AKI, possibly prerenal ?Baseline hemoglobin appears to be 0.79 with GFR greater than ?Presented with creatinine of 1.39 with GFR 39. ?Plan: ?Avoid nephrotoxic agents, dehydration and hypotension ?Monitor urine output ?Check creatinine in a.m. ?  ?Mild leukocytosis, suspect reactive in the setting of bone  fracture ?WBC 11.2 K, afebrile with no evidence of active infective process. ?Suspect reactive ?Continue to monitor for now ?  ?Interstitial lung disease ?Chronic hypoxic respiratory failure ?Stable, at baseline ?On 3 L continuously at baseline ?  ?Permanent A-fib with slow ventricular response ?Heart rate in the 50s ?Chronic resting bradycardia ?Plan: ?Restart Coreg ?Restart Eliquis ? ?  ?Essential hypertension ?BP is not at goal, elevated. ?Plan: ?Restart home agents ?As needed IV hydralazine ? ? ?DVT prophylaxis: Eliquis ?Code Status: Full ?Family Communication: Husband at bedside 4/7 ?Disposition Plan: Status is: Inpatient ?Remains inpatient appropriate because: Hip fracture status post IM nail.  Postoperative day #1.  Pending PT and OT consults ? ? ?Level of care: Telemetry Surgical ? ?Consultants:  ?Orthopedic surgery ? ?Procedures:  ?IM nail 4/6 ? ?Antimicrobials: ?None ? ? ?Subjective: ?Seen and examined.  Resting comfortably in bed.  No visible distress.  Pain mild to moderate.  Husband at bedside ? ?Objective: ?Vitals:  ? 10/16/21 2322 10/17/21 0102 10/17/21 0531 10/17/21 0847  ?BP: (!) 146/63 140/76 (!) 157/81 (!) 147/99  ?Pulse: (!) 57 66 88 91  ?Resp: '16 17 17 16  '$ ?Temp: 97.9 ?F (36.6 ?C) 97.9 ?F (36.6 ?C) 97.7 ?F (36.5 ?C) 98.6 ?F (37 ?C)  ?TempSrc:      ?SpO2: 100% 100% 100% 100%  ?Weight:      ?Height:      ? ? ?Intake/Output Summary (Last 24 hours) at 10/17/2021 1010 ?Last data filed at 10/17/2021 2952 ?Gross per 24 hour  ?Intake 1076.16 ml  ?Output 990 ml  ?Net 86.16 ml  ? ?Filed Weights  ? 10/16/21 0059  ?Weight: 68.9 kg  ? ? ?Examination: ? ?General exam: Appears calm and  comfortable  ?Respiratory system: Bibasilar crackles.  Normal work of breathing.  3 L ?Cardiovascular system: S1-S2, regular rate, irregular rhythm, no murmurs ?Gastrointestinal system: Soft, NT/ND, normal bowel sounds ?Central nervous system: Alert and oriented. No focal neurological deficits. ?Extremities: Right hip surgical site  clean dry and intact ?Skin: No rashes, lesions or ulcers ?Psychiatry: Judgement and insight appear normal. Mood & affect appropriate.  ? ? ? ?Data Reviewed: I have personally reviewed following labs and imaging studies ? ?CBC: ?Recent Labs  ?Lab 10/16/21 ?0114 10/17/21 ?0424  ?WBC 11.2* 15.0*  ?HGB 11.3* 9.4*  ?HCT 36.4 30.9*  ?MCV 94.8 96.6  ?PLT 261 212  ? ?Basic Metabolic Panel: ?Recent Labs  ?Lab 10/16/21 ?0114  ?NA 137  136  ?K 4.0  4.0  ?CL 99  99  ?CO2 26  28  ?GLUCOSE 143*  149*  ?BUN 28*  27*  ?CREATININE 1.39*  1.33*  ?CALCIUM 9.3  9.3  ?MG 2.2  ?PHOS 4.3  4.3  ? ?GFR: ?Estimated Creatinine Clearance: 30.4 mL/min (A) (by C-G formula based on SCr of 1.33 mg/dL (H)). ?Liver Function Tests: ?Recent Labs  ?Lab 10/16/21 ?0114  ?ALBUMIN 3.8  ? ?No results for input(s): LIPASE, AMYLASE in the last 168 hours. ?No results for input(s): AMMONIA in the last 168 hours. ?Coagulation Profile: ?Recent Labs  ?Lab 10/16/21 ?0114  ?INR 1.1  ? ?Cardiac Enzymes: ?No results for input(s): CKTOTAL, CKMB, CKMBINDEX, TROPONINI in the last 168 hours. ?BNP (last 3 results) ?No results for input(s): PROBNP in the last 8760 hours. ?HbA1C: ?No results for input(s): HGBA1C in the last 72 hours. ?CBG: ?No results for input(s): GLUCAP in the last 168 hours. ?Lipid Profile: ?No results for input(s): CHOL, HDL, LDLCALC, TRIG, CHOLHDL, LDLDIRECT in the last 72 hours. ?Thyroid Function Tests: ?No results for input(s): TSH, T4TOTAL, FREET4, T3FREE, THYROIDAB in the last 72 hours. ?Anemia Panel: ?No results for input(s): VITAMINB12, FOLATE, FERRITIN, TIBC, IRON, RETICCTPCT in the last 72 hours. ?Sepsis Labs: ?No results for input(s): PROCALCITON, LATICACIDVEN in the last 168 hours. ? ?Recent Results (from the past 240 hour(s))  ?Surgical PCR screen     Status: None  ? Collection Time: 10/16/21  9:01 AM  ? Specimen: Nasal Mucosa; Nasal Swab  ?Result Value Ref Range Status  ? MRSA, PCR NEGATIVE NEGATIVE Final  ? Staphylococcus aureus  NEGATIVE NEGATIVE Final  ?  Comment: (NOTE) ?The Xpert SA Assay (FDA approved for NASAL specimens in patients 1 ?years of age and older), is one component of a comprehensive ?surveillance program. It is not intended to diagnose infection nor to ?guide or monitor treatment. ?Performed at [facility], [place], ?[place] [address] ?  ?  ? ? ? ? ? ?Radiology Studies: ?CT HEAD WO CONTRAST (5MM) ? ?Result Date: 10/16/2021 ?CLINICAL DATA:  Minor head trauma.  Mechanical fall. EXAM: CT HEAD WITHOUT CONTRAST TECHNIQUE: Contiguous axial images were obtained from the base of the skull through the vertex without intravenous contrast. RADIATION DOSE REDUCTION: This exam was performed according to the departmental dose-optimization program which includes automated exposure control, adjustment of the mA and/or kV according to patient size and/or use of iterative reconstruction technique. COMPARISON:  06/18/2020 FINDINGS: Brain: No evidence of acute infarction, hemorrhage, hydrocephalus, extra-axial collection or mass lesion/mass effect. Patchy low-attenuation changes in the deep white matter are similar to prior study, likely small vessel ischemic changes. Vascular: Moderate intracranial arterial vascular calcifications. Skull: Calvarium appears intact. Sinuses/Orbits: Mucous retention cyst in the right  maxillary antrum. Paranasal sinuses and mastoid air cells are otherwise clear. Other: None. IMPRESSION: No acute intracranial abnormalities. Chronic appearing small vessel ischemic changes. Electronically Signed   By: Lucienne Capers M.D.   On: 10/16/2021 01:55  ? ?CT Cervical Spine Wo Contrast ? ?Result Date: 10/16/2021 ?CLINICAL DATA:  Neck trauma after mechanical fall. EXAM: CT CERVICAL SPINE WITHOUT CONTRAST TECHNIQUE: Multidetector CT imaging of the cervical spine was performed without intravenous contrast. Multiplanar CT image reconstructions were also generated. RADIATION DOSE REDUCTION: This exam was  performed according to the departmental dose-optimization program which includes automated exposure control, adjustment of the mA and/or kV according to patient size and/or use of iterative reconstruction technique. [place]

## 2021-10-17 NOTE — Progress Notes (Signed)
?Subjective: ? ?Patient reports pain as relatively well controlled.  She was able to get to the side of the bed with PT this morning.  No other complaints at this time. ? ?Objective:  ? ?VITALS:   ?Vitals:  ? 10/16/21 2322 10/17/21 0102 10/17/21 0531 10/17/21 0847  ?BP: (!) 146/63 140/76 (!) 157/81 (!) 147/99  ?Pulse: (!) 57 66 88 91  ?Resp: '16 17 17 16  '$ ?Temp: 97.9 ?F (36.6 ?C) 97.9 ?F (36.6 ?C) 97.7 ?F (36.5 ?C) 98.6 ?F (37 ?C)  ?TempSrc:      ?SpO2: 100% 100% 100% 100%  ?Weight:      ?Height:      ? ? ?PHYSICAL EXAM: ? ?General: Comfortable, alert, resting in bed ?Right lower extremity: Dressings are clean dry and intact.  Right lower extremity is grossly motor and sensory intact ? ?LABS ? ?Results for orders placed or performed during the hospital encounter of 10/16/21 (from the past 24 hour(s))  ?CBC     Status: Abnormal  ? Collection Time: 10/17/21  4:24 AM  ?Result Value Ref Range  ? WBC 15.0 (H) 4.0 - 10.5 K/uL  ? RBC 3.20 (L) 3.87 - 5.11 MIL/uL  ? Hemoglobin 9.4 (L) 12.0 - 15.0 g/dL  ? HCT 30.9 (L) 36.0 - 46.0 %  ? MCV 96.6 80.0 - 100.0 fL  ? MCH 29.4 26.0 - 34.0 pg  ? MCHC 30.4 30.0 - 36.0 g/dL  ? RDW 14.2 11.5 - 15.5 %  ? Platelets 212 150 - 400 K/uL  ? nRBC 0.0 0.0 - 0.2 %  ? ? ?CT HEAD WO CONTRAST (5MM) ? ?Result Date: 10/16/2021 ?CLINICAL DATA:  Minor head trauma.  Mechanical fall. EXAM: CT HEAD WITHOUT CONTRAST TECHNIQUE: Contiguous axial images were obtained from the base of the skull through the vertex without intravenous contrast. RADIATION DOSE REDUCTION: This exam was performed according to the departmental dose-optimization program which includes automated exposure control, adjustment of the mA and/or kV according to patient size and/or use of iterative reconstruction technique. COMPARISON:  06/18/2020 FINDINGS: Brain: No evidence of acute infarction, hemorrhage, hydrocephalus, extra-axial collection or mass lesion/mass effect. Patchy low-attenuation changes in the deep white matter are similar  to prior study, likely small vessel ischemic changes. Vascular: Moderate intracranial arterial vascular calcifications. Skull: Calvarium appears intact. Sinuses/Orbits: Mucous retention cyst in the right maxillary antrum. Paranasal sinuses and mastoid air cells are otherwise clear. Other: None. IMPRESSION: No acute intracranial abnormalities. Chronic appearing small vessel ischemic changes. Electronically Signed   By: Lucienne Capers M.D.   On: 10/16/2021 01:55  ? ?CT Cervical Spine Wo Contrast ? ?Result Date: 10/16/2021 ?CLINICAL DATA:  Neck trauma after mechanical fall. EXAM: CT CERVICAL SPINE WITHOUT CONTRAST TECHNIQUE: Multidetector CT imaging of the cervical spine was performed without intravenous contrast. Multiplanar CT image reconstructions were also generated. RADIATION DOSE REDUCTION: This exam was performed according to the departmental dose-optimization program which includes automated exposure control, adjustment of the mA and/or kV according to patient size and/or use of iterative reconstruction technique. COMPARISON:  None. FINDINGS: Alignment: Normal alignment. Skull base and vertebrae: No acute fracture. No primary bone lesion or focal pathologic process. Soft tissues and spinal canal: No prevertebral fluid or swelling. No visible canal hematoma. Disc levels: Degenerative changes with disc space narrowing and endplate osteophyte formation at C5-6 and C6-7 levels. Prominent degenerative changes at C1-2. degenerative changes in the facet joints. Upper chest: Scarring and bronchiectasis are suggested in the lung apices with ground-glass alveolar infiltrates. Other: None.  IMPRESSION: 1. Normal alignment.  No acute displaced fractures. 2. Moderate degenerative changes. 3. 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 Electronically Signed   By: Lucienne Capers M.D.   On: 10/16/2021 01:59  ? ?CT Hip Right Wo Contrast ? ?Result Date: 10/16/2021 ?CLINICAL DATA:  Hip  trauma EXAM: CT OF THE RIGHT HIP WITHOUT CONTRAST TECHNIQUE: Multidetector CT imaging of the right hip was performed according to the standard protocol. Multiplanar CT image reconstructions were also generated. RADIATION DOSE REDUCTION: This exam was performed according to the departmental dose-optimization program which includes automated exposure control, adjustment of the mA and/or kV according to patient size and/or use of iterative reconstruction technique. COMPARISON:  None. FINDINGS: Bones/Joint/Cartilage There is a comminuted intratrochanteric fracture of the right femur with mild medial angulation. The fracture extends into the proximal shaft of the right femur. No dislocation Ligaments Suboptimally assessed by CT. Muscles and Tendons Unremarkable Soft tissues Unremarkable IMPRESSION: Comminuted intratrochanteric fracture of the right femur with mild medial angulation and extension into the proximal femoral metaphysis. Electronically Signed   By: Ulyses Jarred M.D.   On: 10/16/2021 02:44  ? ?DG Chest Portable 1 View ? ?Result Date: 10/16/2021 ?CLINICAL DATA:  Fall, hip fracture EXAM: PORTABLE CHEST 1 VIEW COMPARISON:  06/17/2020 FINDINGS: The lungs are symmetrically well inflated. Bibasilar interstitial pulmonary infiltrates are present, progressive since prior examination, most suggestive of bibasilar pulmonary fibrotic change. Chronic pulmonary edema, however, could appear similarly. No pneumothorax or pleural effusion. Mild cardiomegaly is present, slightly progressive since prior examination. No acute bone abnormality. IMPRESSION: Progressive, mild cardiomegaly. Development of bibasilar pulmonary infiltrates most in keeping with pulmonary fibrotic change or, less likely, chronic pulmonary edema. Electronically Signed   By: Fidela Salisbury M.D.   On: 10/16/2021 01:45  ? ?DG C-Arm 1-60 Min-No Report ? ?Result Date: 10/16/2021 ?Fluoroscopy was utilized by the requesting physician.  No radiographic  interpretation.  ? ?DG HIP UNILAT WITH PELVIS 2-3 VIEWS RIGHT ? ?Result Date: 10/16/2021 ?CLINICAL DATA:  Intraoperative fluoroscopy EXAM: DG HIP (WITH OR WITHOUT PELVIS) 2-3V RIGHT COMPARISON:  10/16/2021 FINDINGS: Intraoperative fluoroscopy is obtained for surgical control purposes. Fluoroscopy time is 2 minutes 15.4 seconds Dose is 20 mGy cumulative air kerma Six spot fluoroscopic images are obtained. Spot fluoroscopic images obtained demonstrate internal fixation of inter trochanteric fractures of the proximal right femur using along intramedullary rod with proximal compression bolts and distal locking screws. Near anatomic alignment is indicated. IMPRESSION: Intraoperative fluoroscopy is utilized for surgical control purposes during internal fixation of an inter trochanteric right hip fracture. Electronically Signed   By: Lucienne Capers M.D.   On: 10/16/2021 22:40  ? ?DG Hip Unilat W or Wo Pelvis 2-3 Views Right ? ?Result Date: 10/16/2021 ?CLINICAL DATA:  Fall, right hip deformity EXAM: DG HIP (WITH OR WITHOUT PELVIS) 2-3V RIGHT COMPARISON:  None. FINDINGS: There is an acute, comminuted, anteriorly displaced intratrochanteric fracture of the right hip with moderate varus angulation of the distal fracture fragment. Femoral head is still seated within the right acetabulum. Mild right hip degenerative arthritis with mild joint space narrowing. Pelvis and proximal left hip are intact. IMPRESSION: Comminuted, angulated, overriding intratrochanteric right hip fracture Electronically Signed   By: Fidela Salisbury M.D.   On: 10/16/2021 01:43   ? ?Assessment/Plan: ?1 Day Post-Op  ?Status post right hip IM nail ? ?-Appreciate hospitalist team support ?-PT/OT: Weight-bear as tolerated right lower extremity ?-Okay to resume DVT prophylaxis/Eliquis POD1 ?-Dressing change per nursing staff to  remain clean and dry ?-Discharge per case management/PT ?-Follow-up in clinic in 2 weeks for x-rays and staple removal ? ? ?Renee Harder , MD ?10/17/2021, 3:55 PM ? ? ? ? ? ? ?

## 2021-10-17 NOTE — Anesthesia Postprocedure Evaluation (Signed)
Anesthesia Post Note ? ?Patient: Joyce Evans ? ?Procedure(s) Performed: INTRAMEDULLARY (IM) NAIL INTERTROCHANTRIC (Right: Hip) ? ?Patient location during evaluation: PACU ?Anesthesia Type: General ?Level of consciousness: awake and alert, oriented and patient cooperative ?Pain management: pain level controlled ?Vital Signs Assessment: post-procedure vital signs reviewed and stable ?Respiratory status: spontaneous breathing, nonlabored ventilation and respiratory function stable ?Cardiovascular status: blood pressure returned to baseline and stable ?Postop Assessment: adequate PO intake ?Anesthetic complications: no ? ? ?No notable events documented. ? ? ?Last Vitals:  ?Vitals:  ? 10/17/21 0102 10/17/21 0531  ?BP: 140/76 (!) 157/81  ?Pulse: 66 88  ?Resp: 17 17  ?Temp: 36.6 ?C 36.5 ?C  ?SpO2: 100% 100%  ?  ?Last Pain:  ?Vitals:  ? 10/17/21 0600  ?TempSrc:   ?PainSc: 4   ? ? ?  ?  ?  ?  ?  ?  ? ?Darrin Nipper ? ? ? ? ?

## 2021-10-17 NOTE — Evaluation (Signed)
Occupational Therapy Evaluation ?Patient Details ?Name: Joyce Evans ?MRN: [id number] ?DOB: 1941-01-25 ?Today's Date: 10/17/2021 ? ? ?History of Present Illness Joyce Evans is a 81 y.o. female with medical history significant for interstitial lung disease, chronic hypoxia on 3 L nasal cannula continuously, permanent A-fib on Eliquis, GERD/gastritis, tricuspid regurgitation s/p repair, hypertension, hyperlipidemia, squamous cell skin cancer, who presented to [facility] ED after a mechanical fall at home and subsequent severe right hip pain. Pt found to have a R intertrochanteric hip fracture and is now s/p R IM nailing. WBAT.  ? ?Clinical Impression ?  ?Joyce Evans was seen for OT evaluation this date, POD#1 from above surgery. Pt was independent in all ADLs prior to surgery, however occasionally using a 4WW for community mobility due to baseline respiratory status. Pt is eager to return to PLOF with less pain and improved safety and independence. Pt currently requires MOD-MAX assist for LB dressing and bathing while in seated position due to pain and limited AROM of R hip. She attempts to perform STS transfers during session but is unsuccessful after x2 attempts. Pt endorses signifciant pain with mobility attempts and requests return to bed. She requires +2 MAX A for sit>sup t/f. Pt/spouse instructed in self care skills, falls prevention strategies, home/routines modifications, DME/AE for LB bathing and dressing tasks, and compression stocking mgt strategies. Pt would benefit from additional instruction in self care skills and techniques to help maintain precautions with or without assistive devices to support recall and carryover prior to discharge. Recommend STR upon discharge.  ?  ?   ? ?Recommendations for follow up therapy are one component of a multi-disciplinary discharge planning process, led by the attending physician.  Recommendations may be updated based on patient status, additional functional criteria and  insurance authorization.  ? ?Follow Up Recommendations ? Skilled nursing-short term rehab (<3 hours/day)  ?  ?Assistance Recommended at Discharge Intermittent Supervision/Assistance  ?Patient can return home with the following Two people to help with walking and/or transfers;A lot of help with bathing/dressing/bathroom;Assist for transportation;Help with stairs or ramp for entrance ? ?  ?Functional Status Assessment ? Patient has had a recent decline in their functional status and demonstrates the ability to make significant improvements in function in a reasonable and predictable amount of time.  ?Equipment Recommendations ? BSC/3in1  ?  ?Recommendations for Other Services   ? ? ?  ?Precautions / Restrictions Precautions ?Precautions: Fall ?Restrictions ?Weight Bearing Restrictions: Yes ?RLE Weight Bearing: Weight bearing as tolerated  ? ?  ? ?Mobility Bed Mobility ?Overal bed mobility: Needs Assistance ?Bed Mobility: Supine to Sit, Sit to Supine ?  ?  ?Supine to sit: Mod assist, HOB elevated ?Sit to supine: Max assist, +2 for physical assistance ?  ?  ?  ? ?Transfers ?  ?  ?  ?  ?  ?  ?  ?  ?  ?General transfer comment: Attempt STS x2 during session, but pt is pain limited and requests return to bed. unable to come to full stand this am. ?  ? ?  ?Balance Overall balance assessment: Needs assistance ?Sitting-balance support: Single extremity supported, No upper extremity supported, Feet supported ?Sitting balance-Leahy Scale: Good ?Sitting balance - Comments: Able to progress to static sitting at EOB without UE support. Initial L lateral lean for weight shift off of RLE. ?  ?  ?Standing balance-Leahy Scale: Zero ?Standing balance comment: Pt unable to come to full stand. ?  ?  ?  ?  ?  ?  ?  ?  ?  ?  ?  ?   ? ?  ADL either performed or assessed with clinical judgement  ? ?ADL Overall ADL's : Needs assistance/impaired ?  ?  ?  ?  ?  ?  ?  ?  ?  ?  ?  ?  ?  ?  ?  ?  ?  ?  ?  ?General ADL Comments: Pt is signifcantly  functionally limited by increased pain and decreased AROM of her RLE. She requires MOD A for bed mobility. Once seated EOB she is able to use sock aid to don bilat hospital socks with min assist and cueing for sequencing. Pt is unable to perform STS t/f despite multiple attempts 2/2 pain. Anticipate +2 assist required for LB ADL management from STS. +2 MAX for functional transfers including toilet t/fs. SET UP assist for UB ADL management seated at EOB.  ? ? ? ?Vision Patient Visual Report: No change from baseline ?   ?   ?Perception   ?  ?Praxis   ?  ? ?Pertinent Vitals/Pain Pain Assessment ?Pain Assessment: 0-10 ?Pain Score: 7  ?Pain Location: RLE with mobility attempts. ?Pain Descriptors / Indicators: Other (Comment), Grimacing, Stabbing, Guarding, Shooting, Sharp (yelling) ?Pain Intervention(s): Limited activity within patient's tolerance, Monitored during session, Repositioned, Utilized relaxation techniques, Patient requesting pain meds-RN notified  ? ? ? ?Hand Dominance Right ?  ?Extremity/Trunk Assessment Upper Extremity Assessment ?Upper Extremity Assessment: Overall WFL for tasks assessed ?  ?Lower Extremity Assessment ?Lower Extremity Assessment: Generalized weakness;RLE deficits/detail ?RLE Deficits / Details: s/p R IM nail, WBAT ?RLE: Unable to fully assess due to pain ?RLE Sensation: decreased light touch;history of peripheral neuropathy (pt endorses hx of peripheral neuropathy bilat, increased in RLE today) ?RLE Coordination: decreased gross motor;decreased fine motor ?  ?  ?  ?Communication Communication ?Communication: No difficulties ?  ?Cognition Arousal/Alertness: Awake/alert ?Behavior During Therapy: [facility] for tasks assessed/performed ?Overall Cognitive Status: Within Functional Limits for tasks assessed ?  ?  ?  ?  ?  ?  ?  ?  ?  ?  ?  ?  ?  ?  ?  ?  ?General Comments: Pt is pleasant, eager to participate in session, but pain limited. ?  ?  ?General Comments    ? ?  ?Exercises Other  Exercises ?Other Exercises: Pt/caregiver educated on falls prevention strategies, safe use of AE/DME for ADL management, bed mobility techniques, and routines modifications to support safety and functional independence during ADL management. OT facilitated LB dressing task during session as well as attempts at functional mobility with assistance as described above. See ADL section for additional detail. ?  ?Shoulder Instructions    ? ? ?Home Living Family/patient expects to be discharged to:: Private residence ?Living Arrangements: Spouse/significant other ?Available Help at Discharge: Family;Available 24 hours/day ?Type of Home: House ?Home Access: Other (comment) ([place]) ?  ?  ?Home Layout: One level ?  ?  ?Bathroom Shower/Tub: Tub/shower unit ?  ?Bathroom Toilet: Standard ?Bathroom Accessibility: No ?  ?Home Equipment: Rollator (4 wheels);Cane - single point;Adaptive equipment ?Adaptive Equipment: Reacher ?Additional Comments: Pt on 3L O2 consistently at baseline. Intermittently requires inrease with activity. ?  ? ?  ?Prior Functioning/Environment Prior Level of Function : Independent/Modified Independent;History of Falls (last six months) ?  ?  ?  ?  ?  ?  ?Mobility Comments: Generally independent with functional mobility. using [id number] for longer distances in the community ?ADLs Comments: Independent, pt reports using her exercise bike "almost daily" and does not require physical assistance for ADL/IADL management. ?  ? ?  ?  ?  OT Problem List: Decreased strength;Decreased coordination;Decreased activity tolerance;Decreased safety awareness;Impaired balance (sitting and/or standing);Decreased knowledge of use of DME or AE;Pain;Decreased range of motion ?  ?   ?OT Treatment/Interventions: Self-care/ADL training;Therapeutic exercise;Therapeutic activities;DME and/or AE instruction;Balance training;Patient/family education;Energy conservation  ?  ?OT Goals(Current goals can be found in the care plan section) Acute  Rehab OT Goals ?Patient Stated Goal: To have less pain ?OT Goal Formulation: With patient ?Time For Goal Achievement: 10/31/21 ?Potential to Achieve Goals: Good ?ADL Goals ?Pt Will Perform Lower Body Dressing: with

## 2021-10-18 DIAGNOSIS — S72001D Fracture of unspecified part of neck of right femur, subsequent encounter for closed fracture with routine healing: Secondary | ICD-10-CM | POA: Diagnosis not present

## 2021-10-18 LAB — CBC WITH DIFFERENTIAL/PLATELET
Abs Immature Granulocytes: 0.06 10*3/uL (ref 0.00–0.07)
Basophils Absolute: 0 10*3/uL (ref 0.0–0.1)
Basophils Relative: 0 %
Eosinophils Absolute: 0.2 10*3/uL (ref 0.0–0.5)
Eosinophils Relative: 2 %
HCT: 24.6 % — ABNORMAL LOW (ref 36.0–46.0)
Hemoglobin: 7.5 g/dL — ABNORMAL LOW (ref 12.0–15.0)
Immature Granulocytes: 1 %
Lymphocytes Relative: 19 %
Lymphs Abs: 1.8 10*3/uL (ref 0.7–4.0)
MCH: 28.8 pg (ref 26.0–34.0)
MCHC: 30.5 g/dL (ref 30.0–36.0)
MCV: 94.6 fL (ref 80.0–100.0)
Monocytes Absolute: 1.2 10*3/uL — ABNORMAL HIGH (ref 0.1–1.0)
Monocytes Relative: 12 %
Neutro Abs: 6.4 10*3/uL (ref 1.7–7.7)
Neutrophils Relative %: 66 %
Platelets: 162 10*3/uL (ref 150–400)
RBC: 2.6 MIL/uL — ABNORMAL LOW (ref 3.87–5.11)
RDW: 14.5 % (ref 11.5–15.5)
WBC: 9.7 10*3/uL (ref 4.0–10.5)
nRBC: 0 % (ref 0.0–0.2)

## 2021-10-18 LAB — ABO/RH: ABO/RH(D): A POS

## 2021-10-18 LAB — BASIC METABOLIC PANEL
Anion gap: 5 (ref 5–15)
BUN: 20 mg/dL (ref 8–23)
CO2: 33 mmol/L — ABNORMAL HIGH (ref 22–32)
Calcium: 9.3 mg/dL (ref 8.9–10.3)
Chloride: 101 mmol/L (ref 98–111)
Creatinine, Ser: 0.88 mg/dL (ref 0.44–1.00)
GFR, Estimated: >60 mL/min (ref >60)
Glucose, Bld: 124 mg/dL — ABNORMAL HIGH (ref 70–99)
Potassium: 4.6 mmol/L (ref 3.5–5.1)
Sodium: 139 mmol/L (ref 135–145)

## 2021-10-18 LAB — PREPARE RBC (CROSSMATCH)

## 2021-10-18 LAB — HEMOGLOBIN AND HEMATOCRIT, BLOOD
HCT: 28.6 % — ABNORMAL LOW (ref 36.0–46.0)
Hemoglobin: 9.2 g/dL — ABNORMAL LOW (ref 12.0–15.0)

## 2021-10-18 MED ORDER — METHOCARBAMOL 500 MG PO TABS
500.0000 mg | ORAL_TABLET | Freq: Three times a day (TID) | ORAL | Status: DC | PRN
  Administered 2021-10-18 – 2021-10-21 (×5): 500 mg via ORAL
  Filled 2021-10-18 (×5): qty 1

## 2021-10-18 MED ORDER — SODIUM CHLORIDE 0.9% IV SOLUTION: Freq: Once | INTRAVENOUS | Status: AC

## 2021-10-18 NOTE — Progress Notes (Signed)
PT Cancellation Note ? ?Patient Details ?Name: Joyce Evans ?MRN: [id number] ?DOB: [birth year] ? ? ?Cancelled Treatment:    Per discussion with MD,ordered blood transfusion. Will hold PT until post transfusion vitals/labs draw. PT will continue to follow and progress as able per current POC.  ? ? ?Willette Pa ?10/18/2021, 12:08 PM ?

## 2021-10-18 NOTE — Progress Notes (Signed)
?PROGRESS NOTE ? ? ? ?Joyce Evans  DGU:[id number] DOB: 04/26/1941 DOA: 10/16/2021 ?PCP: Derinda Late, MD  ? ? ?Brief Narrative:  ?81 year old female with history significant for interstitial lung disease, chronic hypoxic respiratory failure on 3 L nasal cannula, permanent atrial fibrillation anticoagulated on Eliquis, hypertension, hyperlipidemia who presents to ED after mechanical fall and subsequent severe right hip pain. ?  ?Patient found to have a comminuted right hip fracture.  Orthopedics consulted from ED.  Plan for operative fixation. ?  ?Perioperative risk assessment: ?  ?Per [facility] Safe perioperative risk assessment the estimated risk probability for perioperative myocardial infarction or cardiac arrest is 0.8%. ? ?4/7: Patient status post IM nail for intertrochanteric hip fracture.  Tolerated procedure well.  No immediate complications.  Postoperative pain mild to moderate. ? ?4/8: Postoperative pain mild to moderate.  Worse with physical therapy today.  Significant dizziness upon standing requiring extensive assistance.  Postoperative anemia noted ? ? ?Assessment & Plan: ?  ?Principal Problem: ?  Closed right hip fracture ([place]) ? ?Right hip fracture status post mechanical fall, POA ?Presented to the ED from home via EMS after tripping on her oxygen tubing. ?Right hip fracture seen on x-ray ?Last dose of Eliquis 11 PM 10/15/2021 ?EDP discussed case with Dr. Sharlet Salina, orthopedic surgery ?Status post IM nail 4/6 ?Plan: ?PT OT as tolerated ?Weightbearing as tolerated ?\Multimodal pain control ?Okay for Eliquis for now ? ?  ?Acute blood loss anemia in the setting of bone fracture status post IM nail ? ?Baseline hemoglobin appears to be 13 ?Presented with hemoglobin of 11.3. ?Postoperative hemoglobin has decreased to 7.5 ?No obvious signs of blood loss ?Plan: ?Transfuse 1 unit PRBC ?Posttransfusion H&H ?Goal hemoglobin 8 ? ?  ?AKI, possibly prerenal ?Baseline hemoglobin appears to be 0.79 with GFR greater  than ?Presented with creatinine of 1.39 with GFR 39. ?Now back to baseline ?Plan: ?Avoid nephrotoxic agents, dehydration and hypotension ?Monitor urine output ?  ?Mild leukocytosis, suspect reactive in the setting of bone fracture ?WBC 11.2 K, afebrile with no evidence of active infective process. ?Now recovered ?Suspect reactive ?Continue to monitor for now ?  ?Interstitial lung disease ?Chronic hypoxic respiratory failure ?Stable, at baseline ?On 3 L continuously at baseline ?  ?Permanent A-fib with slow ventricular response ?Heart rate in the 50s ?Chronic resting bradycardia ?Plan: ?Continue Coreg ?Continue Eliquis ? ?  ?Essential hypertension ?BP is not at goal, elevated. ?Plan: ?Continue home agents ?As needed IV hydralazine ?Ensure pain control ? ? ?DVT prophylaxis: Eliquis ?Code Status: Full ?Family Communication: Husband at bedside 4/7, 4/8 ?Disposition Plan: Status is: Inpatient ?Remains inpatient appropriate because: Hip fracture status post IM nail.  Postoperative day # 2.  Therapy evaluations.  Post surgical transfusion.  Will need skilled nursing facility.  TOC engaged ? ? ?Level of care: Telemetry Surgical ? ?Consultants:  ?Orthopedic surgery ? ?Procedures:  ?IM nail 4/6 ? ?Antimicrobials: ?None ? ? ?Subjective: ?Seen and examined.  Resting comfortably in bed.  No visible distress.  Pain mild to moderate.  Husband at bedside ? ?Objective: ?Vitals:  ? 10/17/21 1952 10/18/21 0445 10/18/21 0729 10/18/21 0946  ?BP: (!) 109/57 115/76 (!) 124/54 (!) 135/58  ?Pulse: 69 (!) 41 87 60  ?Resp: '16 16 16   '$ ?Temp: 98.6 ?F (37 ?C) 97.7 ?F (36.5 ?C) 97.8 ?F (36.6 ?C)   ?TempSrc: Oral     ?SpO2: 100% 100% 100% 100%  ?Weight:      ?Height:      ? ? ?Intake/Output Summary (Last 24  hours) at 10/18/2021 1019 ?Last data filed at 10/18/2021 0416 ?Gross per 24 hour  ?Intake --  ?Output 1925 ml  ?Net -1925 ml  ? ?Filed Weights  ? 10/16/21 0059  ?Weight: 68.9 kg  ? ? ?Examination: ? ?General exam: NAD ?Respiratory system: Bibasilar  crackles.  Normal work of breathing.  3 L ?Cardiovascular system: S1-S2, regular rate, irregular rhythm, no murmurs ?Gastrointestinal system: Soft, NT/ND, normal bowel sounds ?Central nervous system: Alert and oriented. No focal neurological deficits. ?Extremities: Right hip surgical site CDI ?Skin: No rashes, lesions or ulcers ?Psychiatry: Judgement and insight appear normal. Mood & affect appropriate.  ? ? ? ?Data Reviewed: I have personally reviewed following labs and imaging studies ? ?CBC: ?Recent Labs  ?Lab 10/16/21 ?0114 10/17/21 ?0424 10/18/21 ?8325  ?WBC 11.2* 15.0* 9.7  ?NEUTROABS  --   --  6.4  ?HGB 11.3* 9.4* 7.5*  ?HCT 36.4 30.9* 24.6*  ?MCV 94.8 96.6 94.6  ?PLT 261 212 162  ? ?Basic Metabolic Panel: ?Recent Labs  ?Lab 10/16/21 ?0114 10/18/21 ?4982  ?NA 137  136 139  ?K 4.0  4.0 4.6  ?CL 99  99 101  ?CO2 26  28 33*  ?GLUCOSE 143*  149* 124*  ?BUN 28*  27* 20  ?CREATININE 1.39*  1.33* 0.88  ?CALCIUM 9.3  9.3 9.3  ?MG 2.2  --   ?PHOS 4.3  4.3  --   ? ?GFR: ?Estimated Creatinine Clearance: 46 mL/min (by C-G formula based on SCr of 0.88 mg/dL). ?Liver Function Tests: ?Recent Labs  ?Lab 10/16/21 ?0114  ?ALBUMIN 3.8  ? ?No results for input(s): LIPASE, AMYLASE in the last 168 hours. ?No results for input(s): AMMONIA in the last 168 hours. ?Coagulation Profile: ?Recent Labs  ?Lab 10/16/21 ?0114  ?INR 1.1  ? ?Cardiac Enzymes: ?No results for input(s): CKTOTAL, CKMB, CKMBINDEX, TROPONINI in the last 168 hours. ?BNP (last 3 results) ?No results for input(s): PROBNP in the last 8760 hours. ?HbA1C: ?No results for input(s): HGBA1C in the last 72 hours. ?CBG: ?No results for input(s): GLUCAP in the last 168 hours. ?Lipid Profile: ?No results for input(s): CHOL, HDL, LDLCALC, TRIG, CHOLHDL, LDLDIRECT in the last 72 hours. ?Thyroid Function Tests: ?No results for input(s): TSH, T4TOTAL, FREET4, T3FREE, THYROIDAB in the last 72 hours. ?Anemia Panel: ?No results for input(s): VITAMINB12, FOLATE, FERRITIN, TIBC,  IRON, RETICCTPCT in the last 72 hours. ?Sepsis Labs: ?No results for input(s): PROCALCITON, LATICACIDVEN in the last 168 hours. ? ?Recent Results (from the past 240 hour(s))  ?Surgical PCR screen     Status: None  ? Collection Time: 10/16/21  9:01 AM  ? Specimen: Nasal Mucosa; Nasal Swab  ?Result Value Ref Range Status  ? MRSA, PCR NEGATIVE NEGATIVE Final  ? Staphylococcus aureus NEGATIVE NEGATIVE Final  ?  Comment: (NOTE) ?The Xpert SA Assay (FDA approved for NASAL specimens in patients 61 ?years of age and older), is one component of a comprehensive ?surveillance program. It is not intended to diagnose infection nor to ?guide or monitor treatment. ?Performed at [facility], [place], ?[place] [address] ?  ?  ? ? ? ? ? ?Radiology Studies: ?DG C-Arm 1-60 Min-No Report ? ?Result Date: 10/16/2021 ?Fluoroscopy was utilized by the requesting physician.  No radiographic interpretation.  ? ?DG HIP UNILAT WITH PELVIS 2-3 VIEWS RIGHT ? ?Result Date: 10/16/2021 ?CLINICAL DATA:  Intraoperative fluoroscopy EXAM: DG HIP (WITH OR WITHOUT PELVIS) 2-3V RIGHT COMPARISON:  10/16/2021 FINDINGS: Intraoperative fluoroscopy is obtained for surgical  control purposes. Fluoroscopy time is 2 minutes 15.4 seconds Dose is 20 mGy cumulative air kerma Six spot fluoroscopic images are obtained. Spot fluoroscopic images obtained demonstrate internal fixation of inter trochanteric fractures of the proximal right femur using along intramedullary rod with proximal compression bolts and distal locking screws. Near anatomic alignment is indicated. IMPRESSION: Intraoperative fluoroscopy is utilized for surgical control purposes during internal fixation of an inter trochanteric right hip fracture. Electronically Signed   By: Lucienne Capers M.D.   On: 10/16/2021 22:40   ? ? ? ? ? ?Scheduled Meds: ? acidophilus  1 capsule Oral Daily  ? amLODipine  2.5 mg Oral Daily  ? apixaban  5 mg Oral BID  ? atorvastatin  40 mg Oral Daily  ?  brimonidine  1 drop Right Eye BID  ? brinzolamide  1 drop Left Eye TID  ? And  ? brimonidine  1 drop Left Eye TID  ? carvedilol  6.25 mg Oral BID WC  ? citalopram  10 mg Oral Daily  ? famotidine  40 mg Oral QHS  ? feed

## 2021-10-18 NOTE — Plan of Care (Signed)

## 2021-10-18 NOTE — Progress Notes (Deleted)
Amlodipine and Losartan not given, patient said she is not taking it. Documented as Given in [facility], But did not give it to patient.  ?Risaquad was not given also, patient said she takes it at night. Recorded as Given in [facility] but it wasn't given. Retyrned to [place] except the Amlodipine which was already cut in half and disposed.  ? ?

## 2021-10-18 NOTE — Plan of Care (Deleted)
?  Problem: Education: ?Goal: Knowledge of General Education information will improve ?Description: Including pain rating scale, medication(s)/side effects and non-pharmacologic comfort measures ?10/18/2021 1609 by Francis Dowse, RN ?Outcome: Progressing ?10/18/2021 1439 by Francis Dowse, RN ?Outcome: Progressing ?  ?Problem: Health Behavior/Discharge Planning: ?Goal: Ability to manage health-related needs will improve ?10/18/2021 1609 by Francis Dowse, RN ?Outcome: Progressing ?10/18/2021 1439 by Francis Dowse, RN ?Outcome: Progressing ?  ?Problem: Clinical Measurements: ?Goal: Ability to maintain clinical measurements within normal limits will improve ?10/18/2021 1609 by Francis Dowse, RN ?Outcome: Progressing ?10/18/2021 1439 by Francis Dowse, RN ?Outcome: Progressing ?Goal: Will remain free from infection ?10/18/2021 1609 by Francis Dowse, RN ?Outcome: Progressing ?10/18/2021 1439 by Francis Dowse, RN ?Outcome: Progressing ?Goal: Diagnostic test results will improve ?10/18/2021 1609 by Francis Dowse, RN ?Outcome: Progressing ?10/18/2021 1439 by Francis Dowse, RN ?Outcome: Progressing ?Goal: Respiratory complications will improve ?10/18/2021 1609 by Francis Dowse, RN ?Outcome: Progressing ?10/18/2021 1439 by Francis Dowse, RN ?Outcome: Progressing ?Goal: Cardiovascular complication will be avoided ?10/18/2021 1609 by Francis Dowse, RN ?Outcome: Progressing ?10/18/2021 1439 by Francis Dowse, RN ?Outcome: Progressing ?  ?Problem: Activity: ?Goal: Risk for activity intolerance will decrease ?10/18/2021 1609 by Francis Dowse, RN ?Outcome: Progressing ?10/18/2021 1439 by Francis Dowse, RN ?Outcome: Progressing ?  ?Problem: Nutrition: ?Goal: Adequate nutrition will be maintained ?10/18/2021 1609 by Francis Dowse, RN ?Outcome: Progressing ?10/18/2021 1439 by Francis Dowse, RN ?Outcome:  Progressing ?  ?Problem: Coping: ?Goal: Level of anxiety will decrease ?10/18/2021 1609 by Francis Dowse, RN ?Outcome: Progressing ?10/18/2021 1439 by Francis Dowse, RN ?Outcome: Progressing ?  ?Problem: Elimination: ?Goal: Will not experience complications related to bowel motility ?10/18/2021 1609 by Francis Dowse, RN ?Outcome: Progressing ?10/18/2021 1439 by Francis Dowse, RN ?Outcome: Progressing ?Goal: Will not experience complications related to urinary retention ?10/18/2021 1609 by Francis Dowse, RN ?Outcome: Progressing ?10/18/2021 1439 by Francis Dowse, RN ?Outcome: Progressing ?  ?Problem: Pain Managment: ?Goal: General experience of comfort will improve ?10/18/2021 1609 by Francis Dowse, RN ?Outcome: Progressing ?10/18/2021 1439 by Francis Dowse, RN ?Outcome: Progressing ?  ?Problem: Safety: ?Goal: Ability to remain free from injury will improve ?10/18/2021 1609 by Francis Dowse, RN ?Outcome: Progressing ?10/18/2021 1439 by Francis Dowse, RN ?Outcome: Progressing ?  ?Problem: Skin Integrity: ?Goal: Risk for impaired skin integrity will decrease ?10/18/2021 1609 by Francis Dowse, RN ?Outcome: Progressing ?10/18/2021 1439 by Francis Dowse, RN ?Outcome: Progressing ?  ?

## 2021-10-18 NOTE — Progress Notes (Signed)
Physical Therapy Treatment ?Patient Details ?Name: Joyce Evans ?MRN: [id number] ?DOB: January 06, 1941 ?Today's Date: 10/18/2021 ? ? ?History of Present Illness Joyce Evans is a 81 y.o. female with medical history significant for interstitial lung disease, chronic hypoxia on 3 L nasal cannula continuously, permanent A-fib on Eliquis, GERD/gastritis, tricuspid regurgitation s/p repair, hypertension, hyperlipidemia, squamous cell skin cancer, who presented to [facility] ED after a mechanical fall at home and subsequent severe right hip pain. Pt found to have a R intertrochanteric hip fracture and is now s/p R IM nailing. WBAT. ? ?  ?PT Comments  ? ? Pt long sitting in bed upon arriving, she is A and O x 4 with supportive spouse at bedside. Daughter arrived at conclusion of session. Is is motivated and puts forth great effort during session however I severely limited by dizziness/"feeling faint " with standing. Pt required extensive assistance to exit bed and stand, stood ~ 2 minutes prior to profusely sweating and getting dizzy. Max assist to quickly return pt to supine. BP once returned to supine 166/69. She had similar episode previous date. RN/MD aware. Will continue to closely monitor per current POC.  ?   ?Recommendations for follow up therapy are one component of a multi-disciplinary discharge planning process, led by the attending physician.  Recommendations may be updated based on patient status, additional functional criteria and insurance authorization. ? ?Follow Up Recommendations ? Skilled nursing-short term rehab (<3 hours/day) ?  ?  ?Assistance Recommended at Discharge Frequent or constant Supervision/Assistance  ?Patient can return home with the following A little help with walking and/or transfers;A little help with bathing/dressing/bathroom ?  ?Equipment Recommendations ? Other (comment) (defer to next level of care)  ?  ?   ?Precautions / Restrictions Precautions ?Precautions: Fall ?Restrictions ?Weight  Bearing Restrictions: Yes ?RLE Weight Bearing: Weight bearing as tolerated  ?  ? ?Mobility ? Bed Mobility ?Overal bed mobility: Needs Assistance ?Bed Mobility: Supine to Sit, Sit to Supine ?  ?  ?Supine to sit: Mod assist, HOB elevated ?Sit to supine: Max assist ?  ?General bed mobility comments: pt was able to achieve EOB short sit with increased time and mod assist. max assist to return to supine after standing due to pt dizziness. she was profusely sweating with limited activity. ?  ? ?Transfers ?Overall transfer level: Needs assistance ?Equipment used: Rolling walker (2 wheels) ?Transfers: Sit to/from Stand ?Sit to Stand: Min assist, From elevated surface ?  ?  ?  ?  ?  ?General transfer comment: pt stood 1 x EOB for ~ 53mnutes however becomes very dizzy and needed to quickly return to supine due to concerns. BP in supine 166/69. MD and RN made aware of concerns post session. HR elevated to 130s but in A fib throughout session ?  ? ?Ambulation/Gait ?  ?   ?General Gait Details: unable due to dizziness with static standing. ? ? ? ?  ?Balance Overall balance assessment: Needs assistance ?Sitting-balance support: Bilateral upper extremity supported, Feet supported ?Sitting balance-Leahy Scale: Good ?  ?  ?Standing balance support: Bilateral upper extremity supported, During functional activity, Reliant on assistive device for balance ?Standing balance-Leahy Scale: Fair ?Standing balance comment: was able to maintrain static standing with CGA however limited standing time due to dizziness. ?  ?  ?  ?Cognition Arousal/Alertness: Awake/alert ?[facility] ?Overall Cognitive Status: Within Functional Limits for tasks assessed ?  ?   ?General Comments: Pt is A and O x 4 ?  ?  ? ?  ?   ?   ? ?  Pertinent Vitals/Pain Pain Assessment ?Pain Assessment: 0-10 ?Pain Score: 5  ?Faces Pain Scale: Hurts a little bit ?Pain Location: RLE with mobility attempts. ?Pain Descriptors /  Indicators: Grimacing, Stabbing, Guarding, Shooting, Sharp ?Pain Intervention(s): Limited activity within patient's tolerance, Monitored during session, Premedicated before session, Repositioned, Ice applied  ? ? ? ?PT Goals (current goals can now be found in the care plan section) Acute Rehab PT Goals ?Patient Stated Goal: to decrease pain ?Progress towards PT goals: Progressing toward goals ? ?  ?Frequency ? ? ? BID ? ? ? ?  ?PT Plan Current plan remains appropriate  ? ? ?   ?AM-PAC PT "6 Clicks" Mobility   ?Outcome Measure ? Help needed turning from your back to your side while in a flat bed without using bedrails?: A Lot ?Help needed moving from lying on your back to sitting on the side of a flat bed without using bedrails?: A Lot ?Help needed moving to and from a bed to a chair (including a wheelchair)?: A Lot ?Help needed standing up from a chair using your arms (e.g., wheelchair or bedside chair)?: A Lot ?Help needed to walk in hospital room?: A Lot ?Help needed climbing 3-5 steps with a railing? : A Lot ?6 Click Score: 12 ? ?  ?End of Session Equipment Utilized During Treatment: Oxygen ?Activity Tolerance: Patient limited by pain;Other (comment) (limited by dizziness/feeling faint with standing activity) ?Patient left: in bed;with call bell/phone within reach;with bed alarm set;with SCD's reapplied ?Nurse Communication: Mobility status ?PT Visit Diagnosis: Unsteadiness on feet (R26.81);Other abnormalities of gait and mobility (R26.89);Muscle weakness (generalized) (M62.81);History of falling (Z91.81);Difficulty in walking, not elsewhere classified (R26.2);Pain ?Pain - Right/Left: Right ?Pain - part of body: Hip;Leg ?  ? ? ?Time: 5974-1638 ?PT Time Calculation (min) (ACUTE ONLY): 24 min ? ?Charges:  $Therapeutic Activity: 23-37 mins          ?          ? ?Julaine Fusi PTA ?10/18/21, 10:10 AM  ? ?

## 2021-10-19 ENCOUNTER — Encounter: Payer: Self-pay | Admitting: Orthopaedic Surgery

## 2021-10-19 DIAGNOSIS — S72001D Fracture of unspecified part of neck of right femur, subsequent encounter for closed fracture with routine healing: Secondary | ICD-10-CM | POA: Diagnosis not present

## 2021-10-19 LAB — BASIC METABOLIC PANEL
Anion gap: 5 (ref 5–15)
BUN: 19 mg/dL (ref 8–23)
CO2: 35 mmol/L — ABNORMAL HIGH (ref 22–32)
Calcium: 9.1 mg/dL (ref 8.9–10.3)
Chloride: 99 mmol/L (ref 98–111)
Creatinine, Ser: 0.8 mg/dL (ref 0.44–1.00)
GFR, Estimated: >60 mL/min (ref >60)
Glucose, Bld: 118 mg/dL — ABNORMAL HIGH (ref 70–99)
Potassium: 4 mmol/L (ref 3.5–5.1)
Sodium: 139 mmol/L (ref 135–145)

## 2021-10-19 LAB — CBC WITH DIFFERENTIAL/PLATELET
Abs Immature Granulocytes: 0.06 10*3/uL (ref 0.00–0.07)
Basophils Absolute: 0 10*3/uL (ref 0.0–0.1)
Basophils Relative: 0 %
Eosinophils Absolute: 0.4 10*3/uL (ref 0.0–0.5)
Eosinophils Relative: 4 %
HCT: 28.9 % — ABNORMAL LOW (ref 36.0–46.0)
Hemoglobin: 9 g/dL — ABNORMAL LOW (ref 12.0–15.0)
Immature Granulocytes: 1 %
Lymphocytes Relative: 20 %
Lymphs Abs: 2.1 10*3/uL (ref 0.7–4.0)
MCH: 29.5 pg (ref 26.0–34.0)
MCHC: 31.1 g/dL (ref 30.0–36.0)
MCV: 94.8 fL (ref 80.0–100.0)
Monocytes Absolute: 1 10*3/uL (ref 0.1–1.0)
Monocytes Relative: 9 %
Neutro Abs: 6.8 10*3/uL (ref 1.7–7.7)
Neutrophils Relative %: 66 %
Platelets: 162 10*3/uL (ref 150–400)
RBC: 3.05 MIL/uL — ABNORMAL LOW (ref 3.87–5.11)
RDW: 14.6 % (ref 11.5–15.5)
WBC: 10.4 10*3/uL (ref 4.0–10.5)
nRBC: 0 % (ref 0.0–0.2)

## 2021-10-19 LAB — BPAM RBC
Blood Product Expiration Date: 202305062359
ISSUE DATE / TIME: 202304081108
Unit Type and Rh: 6200

## 2021-10-19 LAB — TYPE AND SCREEN
ABO/RH(D): A POS
Antibody Screen: NEGATIVE
Unit division: 0

## 2021-10-19 NOTE — Progress Notes (Signed)
?PROGRESS NOTE ? ? ? ?LAKARA WEILAND  FXT:[id number] DOB: May 30, 1941 DOA: 10/16/2021 ?PCP: Derinda Late, MD  ? ? ?Brief Narrative:  ?81 year old female with history significant for interstitial lung disease, chronic hypoxic respiratory failure on 3 L nasal cannula, permanent atrial fibrillation anticoagulated on Eliquis, hypertension, hyperlipidemia who presents to ED after mechanical fall and subsequent severe right hip pain. ?  ?Patient found to have a comminuted right hip fracture.  Orthopedics consulted from ED.  Plan for operative fixation. ?  ?Perioperative risk assessment: ?  ?Per Lyndel Safe perioperative risk assessment the estimated risk probability for perioperative myocardial infarction or cardiac arrest is 0.8%. ? ?4/7: Patient status post IM nail for intertrochanteric hip fracture.  Tolerated procedure well.  No immediate complications.  Postoperative pain mild to moderate. ? ?4/8: Postoperative pain mild to moderate.  Worse with physical therapy today.  Significant dizziness upon standing requiring extensive assistance.  Postoperative anemia noted ? ?4/9: Hemoglobin responded appropriately to blood transfusion.  Hemoglobin 9 this morning ? ? ?Assessment & Plan: ?  ?Principal Problem: ?  Closed right hip fracture ([place]) ? ?Right hip fracture status post mechanical fall, POA ?Presented to the ED from home via EMS after tripping on her oxygen tubing. ?Right hip fracture seen on x-ray ?Last dose of Eliquis 11 PM 10/15/2021 ?EDP discussed case with Dr. Sharlet Salina, orthopedic surgery ?Status post IM nail 4/6 ?Plan: ?\Continue PT OT ?Weightbearing as tolerated ?\Multimodal pain control ?Continue Eliquis ?  ?Acute blood loss anemia in the setting of bone fracture status post IM nail ? ?Baseline hemoglobin appears to be 13 ?Presented with hemoglobin of 11.3. ?Postoperative hemoglobin has decreased to 7.5 ?No obvious signs of blood loss ?Status post 1 unit PRBC 4/8 ?Posttransfusion H&H stable ?Plan: ?No further  transfusion at this time ?Daily H&H ?Transfuse as needed with goal hemoglobin of 8 ? ?  ?AKI, possibly prerenal ?Baseline hemoglobin appears to be 0.79 with GFR greater than ?Presented with creatinine of 1.39 with GFR 39. ?Now back to baseline ?Plan: ?Avoid nephrotoxic agents, dehydration and hypotension ?Monitor urine output ?  ?Mild leukocytosis, suspect reactive in the setting of bone fracture ?WBC 11.2 K, afebrile with no evidence of active infective process. ?Now recovered ?Suspect reactive ?Continue to monitor for now ?  ?Interstitial lung disease ?Chronic hypoxic respiratory failure ?Stable, at baseline ?On 3 L continuously at baseline ?  ?Permanent A-fib with slow ventricular response ?Heart rate in the 50s ?Chronic resting bradycardia ?Plan: ?Continue Coreg ?Continue Eliquis ?DC telemetry ? ?  ?Essential hypertension ?BP is not at goal, elevated. ?Plan: ?Continue home agents ?As needed IV hydralazine ?Ensure pain control ? ? ?DVT prophylaxis: Eliquis ?Code Status: Full ?Family Communication: Husband at bedside 4/7, 4/8 ?Disposition Plan: Status is: Inpatient ?Remains inpatient appropriate because: Hip fracture status post IM nail.  Postoperative day # 3.  Therapy evaluations.  Anticipate medical readiness for discharge 4/10 ? ? ?Level of care: Telemetry Surgical ? ?Consultants:  ?Orthopedic surgery ? ?Procedures:  ?IM nail 4/6 ? ?Antimicrobials: ?None ? ? ?Subjective: ?Seen and examined.  Resting comfortably in bed.  No visible distress.  Pain well controlled ? ?Objective: ?Vitals:  ? 10/18/21 1417 10/18/21 1954 10/19/21 0446 10/19/21 0756  ?BP: (!) 123/50 (!) 107/51 (!) 154/52 (!) 157/76  ?Pulse: 62 (!) 51 63 67  ?Resp: '16 17 16 14  '$ ?Temp: 97.8 ?F (36.6 ?C) 98.6 ?F (37 ?C) (!) 97.4 ?F (36.3 ?C) (!) 97.5 ?F (36.4 ?C)  ?TempSrc: Oral     ?SpO2: 100% 100%  100% 100%  ?Weight:      ?Height:      ? ? ?Intake/Output Summary (Last 24 hours) at 10/19/2021 0955 ?Last data filed at 10/19/2021 0449 ?Gross per 24 hour   ?Intake 1406 ml  ?Output 2425 ml  ?Net -1019 ml  ? ?Filed Weights  ? 10/16/21 0059  ?Weight: 68.9 kg  ? ? ?Examination: ? ?General exam: No acute distress ?Respiratory system: Bibasilar crackles.  Normal work of breathing.  3 L ?Cardiovascular system: S1-S2, bradycardic, irregular rhythm, no murmurs ?Gastrointestinal system: Soft, NT/ND, normal bowel sounds ?Central nervous system: Alert and oriented. No focal neurological deficits. ?Extremities: Right hip surgical site CDI ?Skin: No rashes, lesions or ulcers ?Psychiatry: Judgement and insight appear normal. Mood & affect appropriate.  ? ? ? ?Data Reviewed: I have personally reviewed following labs and imaging studies ? ?CBC: ?Recent Labs  ?Lab 10/16/21 ?0114 10/17/21 ?0424 10/18/21 ?5638 10/18/21 ?1458 10/19/21 ?0800  ?WBC 11.2* 15.0* 9.7  --  10.4  ?NEUTROABS  --   --  6.4  --  6.8  ?HGB 11.3* 9.4* 7.5* 9.2* 9.0*  ?HCT 36.4 30.9* 24.6* 28.6* 28.9*  ?MCV 94.8 96.6 94.6  --  94.8  ?PLT 261 212 162  --  162  ? ?Basic Metabolic Panel: ?Recent Labs  ?Lab 10/16/21 ?0114 10/18/21 ?9373 10/19/21 ?0800  ?NA 137  136 139 139  ?K 4.0  4.0 4.6 4.0  ?CL 99  99 101 99  ?CO2 26  28 33* 35*  ?GLUCOSE 143*  149* 124* 118*  ?BUN 28*  27* 20 19  ?CREATININE 1.39*  1.33* 0.88 0.80  ?CALCIUM 9.3  9.3 9.3 9.1  ?MG 2.2  --   --   ?PHOS 4.3  4.3  --   --   ? ?GFR: ?Estimated Creatinine Clearance: 50.6 mL/min (by C-G formula based on SCr of 0.8 mg/dL). ?Liver Function Tests: ?Recent Labs  ?Lab 10/16/21 ?0114  ?ALBUMIN 3.8  ? ?No results for input(s): LIPASE, AMYLASE in the last 168 hours. ?No results for input(s): AMMONIA in the last 168 hours. ?Coagulation Profile: ?Recent Labs  ?Lab 10/16/21 ?0114  ?INR 1.1  ? ?Cardiac Enzymes: ?No results for input(s): CKTOTAL, CKMB, CKMBINDEX, TROPONINI in the last 168 hours. ?BNP (last 3 results) ?No results for input(s): PROBNP in the last 8760 hours. ?HbA1C: ?No results for input(s): HGBA1C in the last 72 hours. ?CBG: ?No results for  input(s): GLUCAP in the last 168 hours. ?Lipid Profile: ?No results for input(s): CHOL, HDL, LDLCALC, TRIG, CHOLHDL, LDLDIRECT in the last 72 hours. ?Thyroid Function Tests: ?No results for input(s): TSH, T4TOTAL, FREET4, T3FREE, THYROIDAB in the last 72 hours. ?Anemia Panel: ?No results for input(s): VITAMINB12, FOLATE, FERRITIN, TIBC, IRON, RETICCTPCT in the last 72 hours. ?Sepsis Labs: ?No results for input(s): PROCALCITON, LATICACIDVEN in the last 168 hours. ? ?Recent Results (from the past 240 hour(s))  ?Surgical PCR screen     Status: None  ? Collection Time: 10/16/21  9:01 AM  ? Specimen: Nasal Mucosa; Nasal Swab  ?Result Value Ref Range Status  ? MRSA, PCR NEGATIVE NEGATIVE Final  ? Staphylococcus aureus NEGATIVE NEGATIVE Final  ?  Comment: (NOTE) ?The Xpert SA Assay (FDA approved for NASAL specimens in patients 81 ?years of age and older), is one component of a comprehensive ?surveillance program. It is not intended to diagnose infection nor to ?guide or monitor treatment. ?Performed at [facility], [place], ?[place] [address] ?  ?  ? ? ? ? ? ?  Radiology Studies: ?No results found. ? ? ? ? ? ?Scheduled Meds: ? acidophilus  1 capsule Oral Daily  ? amLODipine  2.5 mg Oral Daily  ? apixaban  5 mg Oral BID  ? atorvastatin  40 mg Oral Daily  ? brimonidine  1 drop Right Eye BID  ? brinzolamide  1 drop Left Eye TID  ? And  ? brimonidine  1 drop Left Eye TID  ? carvedilol  6.25 mg Oral BID WC  ? citalopram  10 mg Oral Daily  ? famotidine  40 mg Oral QHS  ? feeding supplement  237 mL Oral BID BM  ? furosemide  20 mg Oral Daily  ? losartan  50 mg Oral Daily  ? multivitamin with minerals  1 tablet Oral Daily  ? mupirocin ointment  1 application. Nasal BID  ? pantoprazole  40 mg Oral Daily  ? senna-docusate  1 tablet Oral BID  ? timolol  1 drop Both Eyes Daily  ? ?Continuous Infusions: ? ? ? ? LOS: 3 days  ? ? ? ? ?Sidney Ace, MD ?[facility] ? ? ?If 7PM-7AM, please contact  night-coverage ? ?10/19/2021, 9:55 AM  ? ?

## 2021-10-19 NOTE — Progress Notes (Signed)
Physical Therapy Treatment ?Patient Details ?Name: Joyce Evans ?MRN: [id number] ?DOB: [birth year] ?Today's Date: 10/19/2021 ? ? ?History of Present Illness Joyce Evans is a 81 y.o. female with medical history significant for interstitial lung disease, chronic hypoxia on 3 L nasal cannula continuously, permanent A-fib on Eliquis, GERD/gastritis, tricuspid regurgitation s/p repair, hypertension, hyperlipidemia, squamous cell skin cancer, who presented to [facility] ED after a mechanical fall at home and subsequent severe right hip pain. Pt found to have a R intertrochanteric hip fracture and is now s/p R IM nailing. WBAT. ? ?  ?PT Comments  ? ? Pt in chair with nursing this am.  Ready for session after lunch.  She reported minimal dizziness and felt she did well getting up today.  She is able to stand x 3 at chairside with min/mod a x 1 with increased time.  On first attempt she does c/o some dizziness and needs max a to sit in chair.  When questioned she stated she feels this way often but does not pass out.  She does bow her head and rub her neck which she stated stops her from "passing out" due to inc blood flow.  When questioned further she endorses dizziness and minimal vision changes but no darkening/spinning.  She is able to stand x 2 again from chair but is cued to sit before she feels too poorly and she is able to control her descent to chair.  Stands about 30 seconds each attempt.  Seated AAROM x 10. ? ?Overall progressing well.  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  Will continue to monitor.  Symptoms do not seem orthostatic in nature but will re-check if appropriate in future. ? ?SNF remains appropriate for session.  ?  ?Recommendations for follow up therapy are one component of a multi-disciplinary discharge planning process, led by the attending physician.  Recommendations may be updated based on patient status, additional functional criteria and  insurance authorization. ? ?Follow Up Recommendations ? Skilled nursing-short term rehab (<3 hours/day) ?  ?  ?Assistance Recommended at Discharge Frequent or constant Supervision/Assistance  ?Patient can return home with the following A little help with bathing/dressing/bathroom;Help with stairs or ramp for entrance;Assist for transportation;A lot of help with walking and/or transfers ?  ?Equipment Recommendations ? Rolling walker (2 wheels)  ?  ?Recommendations for Other Services   ? ? ?  ?Precautions / Restrictions Precautions ?Precautions: Fall ?Restrictions ?Weight Bearing Restrictions: Yes ?RLE Weight Bearing: Weight bearing as tolerated  ?  ? ?Mobility ? Bed Mobility ?  ?  ?  ?  ?  ?  ?  ?General bed mobility comments: in chair before and after session ?  ? ?Transfers ?Overall transfer level: Needs assistance ?Equipment used: Rolling walker (2 wheels) ?Transfers: Sit to/from Stand ?Sit to Stand: Min assist, Mod assist ?  ?  ?  ?  ?  ?General transfer comment: increased time and cues. ?  ? ?Ambulation/Gait ?  ?  ?  ?  ?  ?  ?  ?General Gait Details: unsafe to progress from chair due to dizziness. ? ? ?Stairs ?  ?  ?  ?  ?  ? ? ?Wheelchair Mobility ?  ? ?Modified Rankin (Stroke Patients Only) ?  ? ? ?  ?Balance Overall balance assessment: Needs assistance ?Sitting-balance support: Bilateral upper extremity supported, Feet supported ?Sitting balance-Leahy Scale: Good ?  ?  ?Standing balance support: Bilateral upper extremity supported,  During functional activity, Reliant on assistive device for balance ?Standing balance-Leahy Scale: Poor ?  ?  ?  ?  ?  ?  ?  ?  ?  ?  ?  ?  ?  ? ?  ?Cognition Arousal/Alertness: Awake/alert ?Behavior During Therapy: [facility] for tasks assessed/performed ?Overall Cognitive Status: Within Functional Limits for tasks assessed ?  ?  ?  ?  ?  ?  ?  ?  ?  ?  ?  ?  ?  ?  ?  ?  ?  ?  ?  ? ?  ?Exercises Other Exercises ?Other Exercises: seated AROM ? ?  ?General Comments   ?  ?  ? ?Pertinent  Vitals/Pain Pain Assessment ?Pain Assessment: Faces ?Faces Pain Scale: Hurts little more ?Pain Location: RLE with mobility attempts. ?Pain Descriptors / Indicators: Grimacing, Stabbing, Guarding, Shooting, Sharp ?Pain Intervention(s): Limited activity within patient's tolerance, Monitored during session, Repositioned  ? ? ?Home Living   ?  ?  ?  ?  ?  ?  ?  ?  ?  ?   ?  ?Prior Function    ?  ?  ?   ? ?PT Goals (current goals can now be found in the care plan section) Progress towards PT goals: Progressing toward goals ? ?  ?Frequency ? ? ? BID ? ? ? ?  ?PT Plan Current plan remains appropriate  ? ? ?Co-evaluation   ?  ?  ?  ?  ? ?  ?AM-PAC PT "6 Clicks" Mobility   ?Outcome Measure ? Help needed turning from your back to your side while in a flat bed without using bedrails?: A Lot ?Help needed moving from lying on your back to sitting on the side of a flat bed without using bedrails?: A Lot ?Help needed moving to and from a bed to a chair (including a wheelchair)?: A Lot ?Help needed standing up from a chair using your arms (e.g., wheelchair or bedside chair)?: A Lot ?Help needed to walk in hospital room?: A Lot ?Help needed climbing 3-5 steps with a railing? : Total ?6 Click Score: 11 ? ?  ?End of Session Equipment Utilized During Treatment: Oxygen;Gait belt ?Activity Tolerance: Patient tolerated treatment well ?Patient left: in chair;with call bell/phone within reach;with chair alarm set;with family/visitor present ?Nurse Communication: Mobility status ?PT Visit Diagnosis: Unsteadiness on feet (R26.81);Other abnormalities of gait and mobility (R26.89);Muscle weakness (generalized) (M62.81);History of falling (Z91.81);Difficulty in walking, not elsewhere classified (R26.2);Pain ?Pain - Right/Left: Right ?Pain - part of body: Hip;Leg ?  ? ? ?Time: 0100-0126 ?PT Time Calculation (min) (ACUTE ONLY): 26 min ? ?Charges:  $Therapeutic Exercise: 8-22 mins ?$Therapeutic Activity: 8-22 mins          ?         Chesley Noon,  PTA ?10/19/21, 2:01 PM ? ?

## 2021-10-19 NOTE — Plan of Care (Signed)

## 2021-10-19 NOTE — TOC Progression Note (Signed)
Transition of Care (TOC) - Progression Note  ? ? ?Patient Details  ?Name: Joyce Evans ?MRN: [id number] ?Date of Birth: 1941/04/08 ? ?Transition of Care (TOC) CM/SW Contact  ?Izola Price, RN ?Phone Number: ?10/19/2021, 2:28 PM ? ?Clinical Narrative:4/9: Patient status post IM nail for intertrochanteric hip fracture. From home with husband. Patient was very dizzy/weak/pale/clammy with PT sessions 4/7 and 4/8. Received blood 4/8 for post op anemia 4/8. Pending another PT evaluation today per provider for updated EDD and then ins authorization. Simmie Davies RN CM  ?    ?Update: 230 pm: PT re-evaluation done for today just now. Uploaded all documents to [facility] and received authorization [id number]  Approved from 10/19/21 to 10/22/21. Attempted to reach [facility] earlier but VM was full. Simmie Davies RN CM [phone] ? ? ? ? ?Expected Discharge Plan: [place] ?Barriers to Discharge: SNF Pending bed offer, Insurance Authorization, Continued Medical Work up ? ?Expected Discharge Plan and Services ?Expected Discharge Plan: [place] ?  ?  ?  ?  ?                ?  ?  ?  ?  ?  ?  ?  ?  ?  ?  ? ? ?Social Determinants of Health (SDOH) Interventions ?  ? ?Readmission Risk Interventions ?   ? View : No data to display.  ?  ?  ?  ? ? ?

## 2021-10-20 ENCOUNTER — Encounter: Payer: Self-pay | Admitting: Orthopaedic Surgery

## 2021-10-20 DIAGNOSIS — S72001D Fracture of unspecified part of neck of right femur, subsequent encounter for closed fracture with routine healing: Secondary | ICD-10-CM | POA: Diagnosis not present

## 2021-10-20 LAB — BASIC METABOLIC PANEL
Anion gap: 6 (ref 5–15)
BUN: 18 mg/dL (ref 8–23)
CO2: 32 mmol/L (ref 22–32)
Calcium: 8.9 mg/dL (ref 8.9–10.3)
Chloride: 100 mmol/L (ref 98–111)
Creatinine, Ser: 0.73 mg/dL (ref 0.44–1.00)
GFR, Estimated: >60 mL/min (ref >60)
Glucose, Bld: 105 mg/dL — ABNORMAL HIGH (ref 70–99)
Potassium: 4 mmol/L (ref 3.5–5.1)
Sodium: 138 mmol/L (ref 135–145)

## 2021-10-20 LAB — CBC WITH DIFFERENTIAL/PLATELET
Abs Immature Granulocytes: 0.08 10*3/uL — ABNORMAL HIGH (ref 0.00–0.07)
Basophils Absolute: 0 10*3/uL (ref 0.0–0.1)
Basophils Relative: 0 %
Eosinophils Absolute: 0.4 10*3/uL (ref 0.0–0.5)
Eosinophils Relative: 4 %
HCT: 27.8 % — ABNORMAL LOW (ref 36.0–46.0)
Hemoglobin: 8.6 g/dL — ABNORMAL LOW (ref 12.0–15.0)
Immature Granulocytes: 1 %
Lymphocytes Relative: 20 %
Lymphs Abs: 2.1 10*3/uL (ref 0.7–4.0)
MCH: 29.5 pg (ref 26.0–34.0)
MCHC: 30.9 g/dL (ref 30.0–36.0)
MCV: 95.2 fL (ref 80.0–100.0)
Monocytes Absolute: 1 10*3/uL (ref 0.1–1.0)
Monocytes Relative: 10 %
Neutro Abs: 6.6 10*3/uL (ref 1.7–7.7)
Neutrophils Relative %: 65 %
Platelets: 187 10*3/uL (ref 150–400)
RBC: 2.92 MIL/uL — ABNORMAL LOW (ref 3.87–5.11)
RDW: 14.6 % (ref 11.5–15.5)
WBC: 10.2 10*3/uL (ref 4.0–10.5)
nRBC: 0 % (ref 0.0–0.2)

## 2021-10-20 NOTE — Plan of Care (Signed)

## 2021-10-20 NOTE — Progress Notes (Signed)
Physical Therapy Treatment ?Patient Details ?Name: Joyce Evans ?MRN: [id number] ?DOB: 02/18/1941 ?Today's Date: 10/20/2021 ? ? ?History of Present Illness CONSTANCE WHITTLE is a 81 y.o. female with medical history significant for interstitial lung disease, chronic hypoxia on 3 L nasal cannula continuously, permanent A-fib on Eliquis, GERD/gastritis, tricuspid regurgitation s/p repair, hypertension, hyperlipidemia, squamous cell skin cancer, who presented to [facility] ED after a mechanical fall at home and subsequent severe right hip pain. Pt found to have a R intertrochanteric hip fracture and is now s/p R IM nailing. WBAT. ? ?  ?PT Comments  ? ? Pt in chair with nursing pre-medicated for session. ? ?Participated in exercises as described below.  She is able to stand x 2 with RW and mod a x 1 for up to 1 minute - remains limited by dizziness but does tolerate a bit longer than yesterday.  She attributes dizziness to medications.  BP is checked as a precaution. ?Sitting after 2 stands -96/46 P 58 ?Sitting after 2 minutes 104/53 P 57 ?Standing 72/56 P 76 ?Unable to stand for 3 minutes but BP right after sitting 72/46 P 64 ?LE's raised in recliner 96/52 P 59 with relief of symptoms. ? ?Overall pt did tolerate increased standing time of 1 minute each vs 30 seconds yesterday and she is able to lift R LE up off floor several times during standing but remains limited by dizziness and BP.  Discussed with team/MD. ?  ?Recommendations for follow up therapy are one component of a multi-disciplinary discharge planning process, led by the attending physician.  Recommendations may be updated based on patient status, additional functional criteria and insurance authorization. ? ?Follow Up Recommendations ? Skilled nursing-short term rehab (<3 hours/day) ?  ?  ?Assistance Recommended at Discharge Frequent or constant Supervision/Assistance  ?Patient can return home with the following A little help with bathing/dressing/bathroom;Help with  stairs or ramp for entrance;Assist for transportation;A lot of help with walking and/or transfers ?  ?Equipment Recommendations ? Rolling walker (2 wheels)  ?  ?Recommendations for Other Services   ? ? ?  ?Precautions / Restrictions Precautions ?Precautions: Fall ?Restrictions ?Weight Bearing Restrictions: Yes ?RLE Weight Bearing: Weight bearing as tolerated  ?  ? ?Mobility ? Bed Mobility ?  ?  ?  ?  ?  ?  ?  ?General bed mobility comments: in chair before and after session ?  ? ?Transfers ?Overall transfer level: Needs assistance ?Equipment used: Rolling walker (2 wheels) ?Transfers: Sit to/from Stand ?Sit to Stand: Min assist, Mod assist ?  ?  ?  ?  ?  ?General transfer comment: increased time and cues. ?  ? ?Ambulation/Gait ?  ?  ?  ?  ?  ?  ?Pre-gait activities: weigth shifting and RLE marches ?General Gait Details: unsafe to progress from chair due to dizziness. ? ? ?Stairs ?  ?  ?  ?  ?  ? ? ?Wheelchair Mobility ?  ? ?Modified Rankin (Stroke Patients Only) ?  ? ? ?  ?Balance Overall balance assessment: Needs assistance ?Sitting-balance support: Bilateral upper extremity supported, Feet supported ?Sitting balance-Leahy Scale: Good ?  ?  ?Standing balance support: Bilateral upper extremity supported, During functional activity, Reliant on assistive device for balance ?Standing balance-Leahy Scale: Poor ?Standing balance comment: was able to maintrain static standing with CGA however limited standing time due to dizziness. ?  ?  ?  ?  ?  ?  ?  ?  ?  ?  ?  ?  ? ?  ?  Cognition Arousal/Alertness: Awake/alert ?Behavior During Therapy: [facility] for tasks assessed/performed ?Overall Cognitive Status: Within Functional Limits for tasks assessed ?  ?  ?  ?  ?  ?  ?  ?  ?  ?  ?  ?  ?  ?  ?  ?  ?General Comments: Pt is A and O x 4 ?  ?  ? ?  ?Exercises   ? ?  ?General Comments   ?  ?  ? ?Pertinent Vitals/Pain Pain Assessment ?Pain Assessment: Faces ?Faces Pain Scale: Hurts little more ?Pain Location: RLE with mobility  attempts. ?Pain Descriptors / Indicators: Grimacing, Stabbing, Guarding, Shooting, Sharp ?Pain Intervention(s): Limited activity within patient's tolerance, Monitored during session, Repositioned  ? ? ?Home Living   ?  ?  ?  ?  ?  ?  ?  ?  ?  ?   ?  ?Prior Function    ?  ?  ?   ? ?PT Goals (current goals can now be found in the care plan section) Progress towards PT goals: Progressing toward goals ? ?  ?Frequency ? ? ? BID ? ? ? ?  ?PT Plan Current plan remains appropriate  ? ? ?Co-evaluation   ?  ?  ?  ?  ? ?  ?AM-PAC PT "6 Clicks" Mobility   ?Outcome Measure ? Help needed turning from your back to your side while in a flat bed without using bedrails?: A Lot ?Help needed moving from lying on your back to sitting on the side of a flat bed without using bedrails?: A Lot ?Help needed moving to and from a bed to a chair (including a wheelchair)?: A Lot ?Help needed standing up from a chair using your arms (e.g., wheelchair or bedside chair)?: A Lot ?Help needed to walk in hospital room?: A Lot ?Help needed climbing 3-5 steps with a railing? : Total ?6 Click Score: 11 ? ?  ?End of Session Equipment Utilized During Treatment: Oxygen;Gait belt ?Activity Tolerance: Patient tolerated treatment well ?Patient left: in chair;with call bell/phone within reach;with chair alarm set;with family/visitor present ?Nurse Communication: Mobility status ?PT Visit Diagnosis: Unsteadiness on feet (R26.81);Other abnormalities of gait and mobility (R26.89);Muscle weakness (generalized) (M62.81);History of falling (Z91.81);Difficulty in walking, not elsewhere classified (R26.2);Pain ?Pain - Right/Left: Right ?Pain - part of body: Hip;Leg ?  ? ? ?Time: 4431-5400 ?PT Time Calculation (min) (ACUTE ONLY): 38 min ? ?Charges:  $Therapeutic Exercise: 8-22 mins ?$Therapeutic Activity: 23-37 mins          ?         Chesley Noon, PTA ?10/20/21, 9:53 AM ? ?

## 2021-10-20 NOTE — Progress Notes (Signed)
Physical Therapy Treatment ?Patient Details ?Name: Joyce Evans ?MRN: [id number] ?DOB: 11-24-1940 ?Today's Date: 10/20/2021 ? ? ?History of Present Illness Joyce Evans is a 81 y.o. female with medical history significant for interstitial lung disease, chronic hypoxia on 3 L nasal cannula continuously, permanent A-fib on Eliquis, GERD/gastritis, tricuspid regurgitation s/p repair, hypertension, hyperlipidemia, squamous cell skin cancer, who presented to [facility] ED after a mechanical fall at home and subsequent severe right hip pain. Pt found to have a R intertrochanteric hip fracture and is now s/p R IM nailing. WBAT. ? ?  ?PT Comments  ? ? Pt ready for to return to bed.  She is able to stand and transfer to bed with RW and min a x 1.  She remains limited by dizziness and + orthostatic BP taken this am,  Reports feeling generally weak and fatigued today.  Declined further activity at this time.   ? ?Pt is OK for +1 for transfer but recommend +2 for any gait attempts away from bed/chair. ?  ?Recommendations for follow up therapy are one component of a multi-disciplinary discharge planning process, led by the attending physician.  Recommendations may be updated based on patient status, additional functional criteria and insurance authorization. ? ?Follow Up Recommendations ? Skilled nursing-short term rehab (<3 hours/day) ?  ?  ?Assistance Recommended at Discharge Frequent or constant Supervision/Assistance  ?Patient can return home with the following A little help with bathing/dressing/bathroom;Help with stairs or ramp for entrance;Assist for transportation;A lot of help with walking and/or transfers ?  ?Equipment Recommendations ? Rolling walker (2 wheels)  ?  ?Recommendations for Other Services   ? ? ?  ?Precautions / Restrictions Precautions ?Precautions: Fall ?Restrictions ?Weight Bearing Restrictions: Yes ?RLE Weight Bearing: Weight bearing as tolerated  ?  ? ?Mobility ? Bed Mobility ?Overal bed mobility: Needs  Assistance ?Bed Mobility: Sit to Supine ?  ?  ?  ?Sit to supine: Mod assist ?  ?General bed mobility comments: in chair before and after session ?  ? ?Transfers ?Overall transfer level: Needs assistance ?Equipment used: Rolling walker (2 wheels) ?Transfers: Sit to/from Stand ?Sit to Stand: Min assist, Mod assist ?  ?  ?  ?  ?  ?General transfer comment: increased time and cues. ?  ? ?Ambulation/Gait ?Ambulation/Gait assistance: Min assist ?Gait Distance (Feet): 3 Feet ?Assistive device: Rolling walker (2 wheels) ?Gait Pattern/deviations: Step-to pattern ?Gait velocity: decreased ?  ?Pre-gait activities: weigth shifting and RLE marches ?General Gait Details: able to take some good steps to chair but remains limited due to dizziness. ? ? ?Stairs ?  ?  ?  ?  ?  ? ? ?Wheelchair Mobility ?  ? ?Modified Rankin (Stroke Patients Only) ?  ? ? ?  ?Balance Overall balance assessment: Needs assistance ?Sitting-balance support: Bilateral upper extremity supported, Feet supported ?Sitting balance-Leahy Scale: Good ?  ?  ?Standing balance support: Bilateral upper extremity supported, During functional activity, Reliant on assistive device for balance ?Standing balance-Leahy Scale: Poor ?Standing balance comment: was able to maintrain static standing with CGA however limited standing time due to dizziness. ?  ?  ?  ?  ?  ?  ?  ?  ?  ?  ?  ?  ? ?  ?Cognition Arousal/Alertness: Awake/alert ?Behavior During Therapy: [facility] for tasks assessed/performed ?Overall Cognitive Status: Within Functional Limits for tasks assessed ?  ?  ?  ?  ?  ?  ?  ?  ?  ?  ?  ?  ?  ?  ?  ?  ?  General Comments: Pt is A and O x 4 ?  ?  ? ?  ?Exercises   ? ?  ?General Comments   ?  ?  ? ?Pertinent Vitals/Pain Pain Assessment ?Pain Assessment: Faces ?Faces Pain Scale: Hurts little more ?Pain Location: RLE with mobility attempts. ?Pain Descriptors / Indicators: Grimacing, Stabbing, Guarding, Shooting, Sharp ?Pain Intervention(s): Limited activity within patient's  tolerance, Monitored during session, Repositioned, Ice applied  ? ? ?Home Living   ?  ?  ?  ?  ?  ?  ?  ?  ?  ?   ?  ?Prior Function    ?  ?  ?   ? ?PT Goals (current goals can now be found in the care plan section) Progress towards PT goals: Progressing toward goals ? ?  ?Frequency ? ? ? BID ? ? ? ?  ?PT Plan Current plan remains appropriate  ? ? ?Co-evaluation   ?  ?  ?  ?  ? ?  ?AM-PAC PT "6 Clicks" Mobility   ?Outcome Measure ? Help needed turning from your back to your side while in a flat bed without using bedrails?: A Lot ?Help needed moving from lying on your back to sitting on the side of a flat bed without using bedrails?: A Lot ?Help needed moving to and from a bed to a chair (including a wheelchair)?: A Lot ?Help needed standing up from a chair using your arms (e.g., wheelchair or bedside chair)?: A Lot ?Help needed to walk in hospital room?: A Lot ?Help needed climbing 3-5 steps with a railing? : Total ?6 Click Score: 11 ? ?  ?End of Session Equipment Utilized During Treatment: Oxygen;Gait belt ?Activity Tolerance: Patient tolerated treatment well ?Patient left: in chair;with call bell/phone within reach;with chair alarm set;with family/visitor present ?Nurse Communication: Mobility status ?PT Visit Diagnosis: Unsteadiness on feet (R26.81);Other abnormalities of gait and mobility (R26.89);Muscle weakness (generalized) (M62.81);History of falling (Z91.81);Difficulty in walking, not elsewhere classified (R26.2);Pain ?Pain - Right/Left: Right ?Pain - part of body: Hip;Leg ?  ? ? ?Time: 3976-7341 ?PT Time Calculation (min) (ACUTE ONLY): 8 min ? ?Charges:  $Therapeutic Activity: 8-22 mins          ?         Chesley Noon, PTA ?10/20/21, 2:37 PM ? ?

## 2021-10-20 NOTE — Progress Notes (Signed)
Occupational Therapy Treatment ?Patient Details ?Name: VERDELLE VALTIERRA ?MRN: [id number] ?DOB: 16-Dec-1940 ?Today's Date: 10/20/2021 ? ? ?History of present illness ARMIYAH CAPRON is a 81 y.o. female with medical history significant for interstitial lung disease, chronic hypoxia on 3 L nasal cannula continuously, permanent A-fib on Eliquis, GERD/gastritis, tricuspid regurgitation s/p repair, hypertension, hyperlipidemia, squamous cell skin cancer, who presented to [facility] ED after a mechanical fall at home and subsequent severe right hip pain. Pt found to have a R intertrochanteric hip fracture and is now s/p R IM nailing. WBAT. ?  ?OT comments ? Pt seen for OT treatment on this date. Upon arrival to room, pt asleep in bed however easily awoken and agreeable to OT tx. With use of bedsheet to manage RLE, pt performed supine>sit transfer requiring only MIN GUARD. Once seated EOB, pt reported dizziness, however dizziness subsided following seated therex. Pt able to verbalize purpose of sock aide from education provided in previous OT tx, and this date, was able to don/doff socks with use of reacher and sock aide requiring only MIN GUARD for dynamic sitting balance at EOB. Pt required MOD A for x1 sit>stand transfer and required CGA to take lateral steps at EOB. Pt declined to sit in recliner and returned to supine, requiring MOD A to manage BLE. Pt is making good progress toward goals and continues to benefit from skilled OT services to maximize return to PLOF and minimize risk of future falls, injury, caregiver burden, and readmission. Will continue to follow POC. Discharge recommendation remains appropriate.    ? ?Recommendations for follow up therapy are one component of a multi-disciplinary discharge planning process, led by the attending physician.  Recommendations may be updated based on patient status, additional functional criteria and insurance authorization. ?   ?Follow Up Recommendations ? Skilled nursing-short term  rehab (<3 hours/day)  ?  ?Assistance Recommended at Discharge Intermittent Supervision/Assistance  ?Patient can return home with the following ? Two people to help with walking and/or transfers;A lot of help with bathing/dressing/bathroom;Assist for transportation;Help with stairs or ramp for entrance ?  ?Equipment Recommendations ? BSC/3in1  ?  ?   ?Precautions / Restrictions Precautions ?Precautions: Fall ?Restrictions ?Weight Bearing Restrictions: Yes ?RLE Weight Bearing: Weight bearing as tolerated  ? ? ?  ? ?Mobility Bed Mobility ?Overal bed mobility: Needs Assistance ?Bed Mobility: Supine to Sit, Sit to Supine ?  ?  ?Supine to sit: Min guard, HOB elevated ?Sit to supine: Mod assist ?  ?General bed mobility comments: With use of bedsheet to manage RLE, pt required only MIN GUARD for supine>sit transfer. During sit>supine, pt required MOD A for BLE management ?  ? ?Transfers ?Overall transfer level: Needs assistance ?Equipment used: Rolling walker (2 wheels) ?Transfers: Sit to/from Stand ?Sit to Stand: Mod assist ?  ?  ?  ?  ?  ?  ?  ?  ?Balance Overall balance assessment: Needs assistance ?Sitting-balance support: No upper extremity supported, Feet supported ?Sitting balance-Leahy Scale: Poor ?Sitting balance - Comments: Requires MIN GUARD for dynamic sitting balance during seated LB dressing ?  ?Standing balance support: Bilateral upper extremity supported, During functional activity, Reliant on assistive device for balance ?Standing balance-Leahy Scale: Fair ?Standing balance comment: able to take side steps at EOB with CGA ?  ?  ?  ?  ?  ?  ?  ?  ?  ?  ?  ?   ? ?ADL either performed or assessed with clinical judgement  ? ?ADL Overall ADL's :  Needs assistance/impaired ?  ?  ?  ?  ?  ?  ?  ?  ?  ?  ?Lower Body Dressing: Min guard;Sitting/lateral leans;With adaptive equipment ?Lower Body Dressing Details (indicate cue type and reason): Pt able to don/doff socks using reacher and sock aide, requiring MIN GUARD  for dynamic sitting balance ?  ?  ?  ?  ?  ?  ?  ?  ?  ? ? ? ?Cognition Arousal/Alertness: Awake/alert ?Behavior During Therapy: [facility] for tasks assessed/performed ?Overall Cognitive Status: Within Functional Limits for tasks assessed ?  ?  ?  ?  ?  ?  ?  ?  ?  ?  ?  ?  ?  ?  ?  ?  ?  ?  ?  ?   ?   ?   ?   ? ? ?Pertinent Vitals/ Pain       Pain Assessment ?Pain Assessment: Faces ?Faces Pain Scale: Hurts little more ?Pain Location: RLE with mobility ?Pain Descriptors / Indicators: Grimacing, Stabbing, Guarding, Shooting, Sharp ?Pain Intervention(s): Monitored during session, Repositioned, Limited activity within patient's tolerance ? ?   ?   ? ?Frequency ? Min 2X/week  ? ? ? ? ?  ?Progress Toward Goals ? ?OT Goals(current goals can now be found in the care plan section) ? Progress towards OT goals: Progressing toward goals ? ?Acute Rehab OT Goals ?Patient Stated Goal: to regain independence ?OT Goal Formulation: With patient ?Time For Goal Achievement: 10/31/21 ?Potential to Achieve Goals: Good  ?Plan Discharge plan remains appropriate;Frequency remains appropriate   ? ?   ?AM-PAC OT "6 Clicks" Daily Activity     ?Outcome Measure ? ? Help from another person eating meals?: None ?Help from another person taking care of personal grooming?: A Little ?Help from another person toileting, which includes using toliet, bedpan, or urinal?: A Lot ?Help from another person bathing (including washing, rinsing, drying)?: A Lot ?Help from another person to put on and taking off regular upper body clothing?: A Little ?Help from another person to put on and taking off regular lower body clothing?: A Lot ?6 Click Score: 16 ? ?  ?End of Session Equipment Utilized During Treatment: Gait belt;Rolling walker (2 wheels);Oxygen ? ?OT Visit Diagnosis: Other abnormalities of gait and mobility (R26.89);History of falling (Z91.81);Pain ?Pain - Right/Left: Right ?Pain - part of body: Hip ?  ?Activity Tolerance Patient limited by pain;Treatment  limited secondary to medical complications (Comment) (limited by dizziness) ?  ?Patient Left in bed;with call bell/phone within reach;with bed alarm set;with family/visitor present ?  ?Nurse Communication Mobility status ?  ? ?   ? ?Time: 1445-1510 ?OT Time Calculation (min): 25 min ? ?Charges: OT General Charges ?$OT Visit: 1 Visit ?OT Treatments ?$Self Care/Home Management : 23-37 mins ? ?Fredirick Maudlin, OTR/L ?[place] ? ?

## 2021-10-20 NOTE — Care Management Important Message (Signed)
Important Message ? ?Patient Details  ?Name: Joyce Evans ?MRN: [id number] ?Date of Birth: 05-31-1941 ? ? ?Medicare Important Message Given:  Yes ? ? ? ? ?[name] ?10/20/2021, 3:30 PM ?

## 2021-10-20 NOTE — Progress Notes (Signed)
?PROGRESS NOTE ? ? ? ?ANNER BAITY  SEG:[id number] DOB: [birth year] DOA: 10/16/2021 ?PCP: Derinda Late, MD  ? ? ?Brief Narrative:  ?81 year old female with history significant for interstitial lung disease, chronic hypoxic respiratory failure on 3 L nasal cannula, permanent atrial fibrillation anticoagulated on Eliquis, hypertension, hyperlipidemia who presents to ED after mechanical fall and subsequent severe right hip pain. ?  ?Patient found to have a comminuted right hip fracture.  Orthopedics consulted from ED.  Plan for operative fixation. ?  ?Perioperative risk assessment: ?  ?Per Lyndel Safe perioperative risk assessment the estimated risk probability for perioperative myocardial infarction or cardiac arrest is 0.8%. ? ?4/7: Patient status post IM nail for intertrochanteric hip fracture.  Tolerated procedure well.  No immediate complications.  Postoperative pain mild to moderate. ? ?4/8: Postoperative pain mild to moderate.  Worse with physical therapy today.  Significant dizziness upon standing requiring extensive assistance.  Postoperative anemia noted ? ?4/9: Hemoglobin responded appropriately to blood transfusion.  Hemoglobin 9 this morning ?4/10: Remains dizzy on exertion. Hb 8.6 ? ? ?Assessment & Plan: ?  ?Principal Problem: ?  Closed right hip fracture ([place]) ? ?Right hip fracture status post mechanical fall, POA ?Presented to the ED from home via EMS after tripping on her oxygen tubing. ?Right hip fracture seen on x-ray ?Last dose of Eliquis 11 PM 10/15/2021 ?EDP discussed case with Dr. Sharlet Salina, orthopedic surgery ?Status post IM nail 4/6 ?Plan: ?Continue PT OT ?Weightbearing as tolerated ?Multimodal pain control ?Continue Eliquis ?  ?Acute blood loss anemia in the setting of bone fracture status post IM nail ? ?Baseline hemoglobin appears to be 13 ?Presented with hemoglobin of 11.3. ?Postoperative hemoglobin has decreased to 7.5 ?No obvious signs of blood loss ?Status post 1 unit PRBC 4/8 ?Posttransfusion  H&H stable ?Down to 8.6 on 4/10 ?Plan: ?No further transfusion at this time ?Check AM CBC ?Transfuse as needed with goal hemoglobin of 8 ?If hemoglobin reassuring anticipate dc 4/11 ? ?  ?AKI, possibly prerenal ?Baseline hemoglobin appears to be 0.79 with GFR greater than ?Presented with creatinine of 1.39 with GFR 39. ?Now back to baseline ?Plan: ?Avoid nephrotoxic agents, dehydration and hypotension ?Monitor urine output ?  ?Mild leukocytosis, suspect reactive in the setting of bone fracture ?WBC 11.2 K, afebrile with no evidence of active infective process. ?Now recovered ?Suspect reactive ?Continue to monitor for now ?  ?Interstitial lung disease ?Chronic hypoxic respiratory failure ?Stable, at baseline ?On 3 L continuously at baseline ?  ?Permanent A-fib with slow ventricular response ?Heart rate in the 50s ?Chronic resting bradycardia ?Plan: ?Continue Coreg ?Continue Eliquis ?DC telemetry ? ?  ?Essential hypertension ?BP is not at goal, elevated. ?Plan: ?Continue home agents ?As needed IV hydralazine ?Ensure pain control ? ? ?DVT prophylaxis: Eliquis ?Code Status: Full ?Family Communication: Husband at bedside 4/7, 4/8, 4/10 ?Disposition Plan: Status is: Inpatient ?Remains inpatient appropriate because: Hip fracture status post IM nail.  Postoperative day # 4  Therapy evaluations.  Anticipate medical readiness for discharge 4/11 ? ? ?Level of care: Med-Surg ? ?Consultants:  ?Orthopedic surgery ? ?Procedures:  ?IM nail 4/6 ? ?Antimicrobials: ?None ? ? ?Subjective: ?Seen and examined.  Resting comfortably in bed.  No visible distress.  Pain well controlled.  Endorses fatigue ? ?Objective: ?Vitals:  ? 10/20/21 0551 10/20/21 0739 10/20/21 0944 10/20/21 1201  ?BP: (!) 141/53 (!) 113/53  (!) 139/58  ?Pulse: 69 65  (!) 58  ?Resp: 16 16    ?Temp: 98.5 ?F (36.9 ?C) 97.8 ?F (36.6 ?  C)    ?TempSrc:  Oral    ?SpO2: 100% 100% 100% 100%  ?Weight:      ?Height:      ? ? ?Intake/Output Summary (Last 24 hours) at 10/20/2021  1403 ?Last data filed at 10/20/2021 0900 ?Gross per 24 hour  ?Intake 720 ml  ?Output 1450 ml  ?Net -730 ml  ? ?Filed Weights  ? 10/16/21 0059  ?Weight: 68.9 kg  ? ? ?Examination: ? ?General exam: NAD.  Appears fatigued ?Respiratory system: Bibasilar crackles.  Normal work of breathing.  3 L ?Cardiovascular system: S1-S2, regular rate, irregular rhythm, no murmurs ?Gastrointestinal system: Soft, NT/ND, normal bowel sounds ?Central nervous system: Alert and oriented. No focal neurological deficits. ?Extremities: Right hip surgical site CDI ?Skin: No rashes, lesions or ulcers ?Psychiatry: Judgement and insight appear normal. Mood & affect appropriate.  ? ? ? ?Data Reviewed: I have personally reviewed following labs and imaging studies ? ?CBC: ?Recent Labs  ?Lab 10/16/21 ?0114 10/17/21 ?0424 10/18/21 ?9937 10/18/21 ?1458 10/19/21 ?0800 10/20/21 ?0731  ?WBC 11.2* 15.0* 9.7  --  10.4 10.2  ?NEUTROABS  --   --  6.4  --  6.8 6.6  ?HGB 11.3* 9.4* 7.5* 9.2* 9.0* 8.6*  ?HCT 36.4 30.9* 24.6* 28.6* 28.9* 27.8*  ?MCV 94.8 96.6 94.6  --  94.8 95.2  ?PLT 261 212 162  --  162 187  ? ?Basic Metabolic Panel: ?Recent Labs  ?Lab 10/16/21 ?0114 10/18/21 ?1696 10/19/21 ?0800 10/20/21 ?0731  ?NA 137  136 139 139 138  ?K 4.0  4.0 4.6 4.0 4.0  ?CL 99  99 101 99 100  ?CO2 26  28 33* 35* 32  ?GLUCOSE 143*  149* 124* 118* 105*  ?BUN 28*  27* '20 19 18  '$ ?CREATININE 1.39*  1.33* 0.88 0.80 0.73  ?CALCIUM 9.3  9.3 9.3 9.1 8.9  ?MG 2.2  --   --   --   ?PHOS 4.3  4.3  --   --   --   ? ?GFR: ?Estimated Creatinine Clearance: 50.6 mL/min (by C-G formula based on SCr of 0.73 mg/dL). ?Liver Function Tests: ?Recent Labs  ?Lab 10/16/21 ?0114  ?ALBUMIN 3.8  ? ?No results for input(s): LIPASE, AMYLASE in the last 168 hours. ?No results for input(s): AMMONIA in the last 168 hours. ?Coagulation Profile: ?Recent Labs  ?Lab 10/16/21 ?0114  ?INR 1.1  ? ?Cardiac Enzymes: ?No results for input(s): CKTOTAL, CKMB, CKMBINDEX, TROPONINI in the last 168  hours. ?BNP (last 3 results) ?No results for input(s): PROBNP in the last 8760 hours. ?HbA1C: ?No results for input(s): HGBA1C in the last 72 hours. ?CBG: ?No results for input(s): GLUCAP in the last 168 hours. ?Lipid Profile: ?No results for input(s): CHOL, HDL, LDLCALC, TRIG, CHOLHDL, LDLDIRECT in the last 72 hours. ?Thyroid Function Tests: ?No results for input(s): TSH, T4TOTAL, FREET4, T3FREE, THYROIDAB in the last 72 hours. ?Anemia Panel: ?No results for input(s): VITAMINB12, FOLATE, FERRITIN, TIBC, IRON, RETICCTPCT in the last 72 hours. ?Sepsis Labs: ?No results for input(s): PROCALCITON, LATICACIDVEN in the last 168 hours. ? ?Recent Results (from the past 240 hour(s))  ?Surgical PCR screen     Status: None  ? Collection Time: 10/16/21  9:01 AM  ? Specimen: Nasal Mucosa; Nasal Swab  ?Result Value Ref Range Status  ? MRSA, PCR NEGATIVE NEGATIVE Final  ? Staphylococcus aureus NEGATIVE NEGATIVE Final  ?  Comment: (NOTE) ?The Xpert SA Assay (FDA approved for NASAL specimens in patients 81 ?years of age and older),  is one component of a comprehensive ?surveillance program. It is not intended to diagnose infection nor to ?guide or monitor treatment. ?Performed at [facility], [place], ?[place] [address] ?  ?  ? ? ? ? ? ?Radiology Studies: ?No results found. ? ? ? ? ? ?Scheduled Meds: ? acidophilus  1 capsule Oral Daily  ? amLODipine  2.5 mg Oral Daily  ? apixaban  5 mg Oral BID  ? atorvastatin  40 mg Oral Daily  ? brimonidine  1 drop Right Eye BID  ? brinzolamide  1 drop Left Eye TID  ? And  ? brimonidine  1 drop Left Eye TID  ? carvedilol  6.25 mg Oral BID WC  ? citalopram  10 mg Oral Daily  ? famotidine  40 mg Oral QHS  ? feeding supplement  237 mL Oral BID BM  ? furosemide  20 mg Oral Daily  ? losartan  50 mg Oral Daily  ? multivitamin with minerals  1 tablet Oral Daily  ? mupirocin ointment  1 application. Nasal BID  ? pantoprazole  40 mg Oral Daily  ? senna-docusate  1 tablet Oral BID   ? timolol  1 drop Both Eyes Daily  ? ?Continuous Infusions: ? ? ? ? LOS: 4 days  ? ? ? ? ?Sidney Ace, MD ?[facility] ? ? ?If 7PM-7AM, please contact night-coverage ? ?10/20/2021, 2:03 PM  ? ?

## 2021-10-21 DIAGNOSIS — S72001D Fracture of unspecified part of neck of right femur, subsequent encounter for closed fracture with routine healing: Secondary | ICD-10-CM | POA: Diagnosis not present

## 2021-10-21 LAB — BASIC METABOLIC PANEL
Anion gap: 6 (ref 5–15)
BUN: 15 mg/dL (ref 8–23)
CO2: 32 mmol/L (ref 22–32)
Calcium: 9.3 mg/dL (ref 8.9–10.3)
Chloride: 101 mmol/L (ref 98–111)
Creatinine, Ser: 0.66 mg/dL (ref 0.44–1.00)
GFR, Estimated: >60 mL/min (ref >60)
Glucose, Bld: 116 mg/dL — ABNORMAL HIGH (ref 70–99)
Potassium: 3.9 mmol/L (ref 3.5–5.1)
Sodium: 139 mmol/L (ref 135–145)

## 2021-10-21 LAB — CBC WITH DIFFERENTIAL/PLATELET
Abs Immature Granulocytes: 0.09 10*3/uL — ABNORMAL HIGH (ref 0.00–0.07)
Basophils Absolute: 0 10*3/uL (ref 0.0–0.1)
Basophils Relative: 0 %
Eosinophils Absolute: 0.4 10*3/uL (ref 0.0–0.5)
Eosinophils Relative: 4 %
HCT: 29.2 % — ABNORMAL LOW (ref 36.0–46.0)
Hemoglobin: 9 g/dL — ABNORMAL LOW (ref 12.0–15.0)
Immature Granulocytes: 1 %
Lymphocytes Relative: 18 %
Lymphs Abs: 1.9 10*3/uL (ref 0.7–4.0)
MCH: 29.4 pg (ref 26.0–34.0)
MCHC: 30.8 g/dL (ref 30.0–36.0)
MCV: 95.4 fL (ref 80.0–100.0)
Monocytes Absolute: 0.9 10*3/uL (ref 0.1–1.0)
Monocytes Relative: 9 %
Neutro Abs: 6.7 10*3/uL (ref 1.7–7.7)
Neutrophils Relative %: 68 %
Platelets: 211 10*3/uL (ref 150–400)
RBC: 3.06 MIL/uL — ABNORMAL LOW (ref 3.87–5.11)
RDW: 14.6 % (ref 11.5–15.5)
WBC: 10.1 10*3/uL (ref 4.0–10.5)
nRBC: 0 % (ref 0.0–0.2)

## 2021-10-21 MED ORDER — METHOCARBAMOL 500 MG PO TABS: 500.0000 mg | ORAL_TABLET | Freq: Three times a day (TID) | ORAL | Status: DC | PRN

## 2021-10-21 MED ORDER — PREDNISONE 5 MG PO TABS
7.0000 mg | ORAL_TABLET | Freq: Every day | ORAL | Status: DC
  Administered 2021-10-21: 7 mg via ORAL
  Filled 2021-10-21: qty 2

## 2021-10-21 MED ORDER — SENNOSIDES-DOCUSATE SODIUM 8.6-50 MG PO TABS: 1.0000 | ORAL_TABLET | Freq: Two times a day (BID) | ORAL | Status: AC

## 2021-10-21 MED ORDER — GABAPENTIN 300 MG PO CAPS: 300.0000 mg | ORAL_CAPSULE | Freq: Two times a day (BID) | ORAL | 0 refills | Status: DC

## 2021-10-21 MED ORDER — CARVEDILOL 6.25 MG PO TABS: 6.2500 mg | ORAL_TABLET | Freq: Two times a day (BID) | ORAL | Status: DC

## 2021-10-21 MED ORDER — HYDROCODONE-ACETAMINOPHEN 5-325 MG PO TABS: 1.0000 | ORAL_TABLET | Freq: Four times a day (QID) | ORAL | 0 refills | Status: AC | PRN

## 2021-10-21 MED ORDER — PREDNISONE 1 MG PO TABS: 7.0000 mg | ORAL_TABLET | Freq: Every day | ORAL | 0 refills | Status: AC

## 2021-10-21 NOTE — Progress Notes (Signed)
Physical Therapy Treatment ?Patient Details ?Name: Joyce Evans ?MRN: [id number] ?DOB: 1940/10/24 ?Today's Date: 10/21/2021 ? ? ?History of Present Illness Joyce Evans is a 81 y.o. female with medical history significant for interstitial lung disease, chronic hypoxia on 3 L nasal cannula continuously, permanent A-fib on Eliquis, GERD/gastritis, tricuspid regurgitation s/p repair, hypertension, hyperlipidemia, squamous cell skin cancer, who presented to [facility] ED after a mechanical fall at home and subsequent severe right hip pain. Pt found to have a R intertrochanteric hip fracture and is now s/p R IM nailing. WBAT. ? ?  ?PT Comments  ? ? Pt received seated in recliner upon arrival to room and pt agreeable to therapy.  Pt able to perform 5x STS with standing amounts increased to the point where she was able to tolerate 3 minutes at one time on the 4th attempt.  Pt needs distraction via conversation in order to stand for prolonged period of time, otherwise pt is dizzy and becomes fixated on inability to perform due to fatigue and dizziness.  Pt left with all needs met and call bell within reach. Current discharge plans to SNF remain appropriate at this time.  Pt will continue to benefit from skilled therapy in order to address deficits listed below. ? ?  ?Recommendations for follow up therapy are one component of a multi-disciplinary discharge planning process, led by the attending physician.  Recommendations may be updated based on patient status, additional functional criteria and insurance authorization. ? ?Follow Up Recommendations ? Skilled nursing-short term rehab (<3 hours/day) ?  ?  ?Assistance Recommended at Discharge Frequent or constant Supervision/Assistance  ?Patient can return home with the following A little help with bathing/dressing/bathroom;Help with stairs or ramp for entrance;Assist for transportation;A lot of help with walking and/or transfers ?  ?Equipment Recommendations ? Rolling walker (2  wheels)  ?  ?Recommendations for Other Services   ? ? ?  ?Precautions / Restrictions Precautions ?Precautions: Fall ?Restrictions ?Weight Bearing Restrictions: Yes ?RLE Weight Bearing: Weight bearing as tolerated  ?  ? ?Mobility ? Bed Mobility ?  ?  ?  ?  ?  ?  ?  ?General bed mobility comments: Pt upright in recliner upon arrival and stayed in recliner. ?  ? ?Transfers ?Overall transfer level: Needs assistance ?Equipment used: Rolling walker (2 wheels) ?Transfers: Sit to/from Stand ?Sit to Stand: Mod assist ?  ?  ?  ?  ?  ?General transfer comment: increased time and cues. ?  ? ?Ambulation/Gait ?Ambulation/Gait assistance: Min assist ?  ?  ?  ?Gait velocity: decreased ?  ?  ?General Gait Details: pt deferred due to dizziness. ? ? ?Stairs ?  ?  ?  ?  ?  ? ? ?Wheelchair Mobility ?  ? ?Modified Rankin (Stroke Patients Only) ?  ? ? ?  ?Balance Overall balance assessment: Needs assistance ?Sitting-balance support: Bilateral upper extremity supported, Feet supported ?Sitting balance-Leahy Scale: Good ?  ?  ?Standing balance support: Bilateral upper extremity supported, During functional activity, Reliant on assistive device for balance ?Standing balance-Leahy Scale: Poor ?Standing balance comment: was able to maintrain static standing with CGA however limited standing time due to dizziness. ?  ?  ?  ?  ?  ?  ?  ?  ?  ?  ?  ?  ? ?  ?Cognition Arousal/Alertness: Awake/alert ?Behavior During Therapy: [facility] for tasks assessed/performed ?Overall Cognitive Status: Within Functional Limits for tasks assessed ?  ?  ?  ?  ?  ?  ?  ?  ?  ?  ?  ?  ?  ?  ?  ?  ?  ?  ?  ? ?  ?  Exercises   ? ?  ?General Comments   ?  ?  ? ?Pertinent Vitals/Pain Pain Assessment ?Pain Assessment: Faces ?Faces Pain Scale: Hurts little more ?Pain Location: RLE with mobility ?Pain Descriptors / Indicators: Grimacing, Stabbing, Guarding, Shooting, Sharp ?Pain Intervention(s): Limited activity within patient's tolerance, Monitored during session, Premedicated  before session, Repositioned  ? ? ?Home Living   ?  ?  ?  ?  ?  ?  ?  ?  ?  ?   ?  ?Prior Function    ?  ?  ?   ? ?PT Goals (current goals can now be found in the care plan section) Progress towards PT goals: Progressing toward goals ? ?  ?Frequency ? ? ? BID ? ? ? ?  ?PT Plan Current plan remains appropriate  ? ? ?Co-evaluation   ?  ?  ?  ?  ? ?  ?AM-PAC PT "6 Clicks" Mobility   ?Outcome Measure ? Help needed turning from your back to your side while in a flat bed without using bedrails?: A Lot ?Help needed moving from lying on your back to sitting on the side of a flat bed without using bedrails?: A Lot ?Help needed moving to and from a bed to a chair (including a wheelchair)?: A Lot ?Help needed standing up from a chair using your arms (e.g., wheelchair or bedside chair)?: A Lot ?Help needed to walk in hospital room?: A Lot ?Help needed climbing 3-5 steps with a railing? : Total ?6 Click Score: 11 ? ?  ?End of Session Equipment Utilized During Treatment: Oxygen;Gait belt ?Activity Tolerance: Patient tolerated treatment well ?Patient left: in chair;with call bell/phone within reach;with chair alarm set;with family/visitor present ?Nurse Communication: Mobility status ?PT Visit Diagnosis: Unsteadiness on feet (R26.81);Other abnormalities of gait and mobility (R26.89);Muscle weakness (generalized) (M62.81);History of falling (Z91.81);Difficulty in walking, not elsewhere classified (R26.2);Pain ?Pain - Right/Left: Right ?Pain - part of body: Hip;Leg ?  ? ? ?Time: 4473-9584 ?PT Time Calculation (min) (ACUTE ONLY): 31 min ? ?Charges:  $Therapeutic Exercise: 23-37 mins          ?          ? ?Gwenlyn Saran, PT, DPT ?10/21/21, 1:21 PM ? ? ? ?Christie Nottingham ?10/21/2021, 1:18 PM ? ?

## 2021-10-21 NOTE — TOC Progression Note (Signed)
Transition of Care (TOC) - Progression Note  ? ? ?Patient Details  ?Name: Joyce Evans ?MRN: [id number] ?Date of Birth: December 29, 1940 ? ?Transition of Care (TOC) CM/SW Contact  ?Conception Oms, RN ?Phone Number: ?10/21/2021, 12:22 PM ? ?Clinical Narrative:   Patient will transport via EMS to [facility] today to [facility], her husband is the room and aware ?EMS called and she is 5th on list ? ? ? ?Expected Discharge Plan: [place] ?Barriers to Discharge: SNF Pending bed offer, Insurance Authorization, Continued Medical Work up ? ?Expected Discharge Plan and Services ?Expected Discharge Plan: [place] ?  ?  ?  ?  ?Expected Discharge Date: 10/21/21               ?  ?  ?  ?  ?  ?  ?  ?  ?  ?  ? ? ?Social Determinants of Health (SDOH) Interventions ?  ? ?Readmission Risk Interventions ?   ? View : No data to display.  ?  ?  ?  ? ? ?

## 2021-10-21 NOTE — Progress Notes (Signed)
Attempted to call and give report to Rehab department in [place] [facility]. Secretary transferred to the unit. Call was not successful. ?Contacted the facility second time and left a message and also a call back telephone number with facility secretary. ?

## 2021-10-21 NOTE — Discharge Summary (Signed)
Physician Discharge Summary  ?Joyce Evans YBW:[id number] DOB: 1941-05-13 DOA: 10/16/2021 ? ?PCP: Derinda Late, MD ? ?Admit date: 10/16/2021 ?Discharge date: 10/21/2021 ? ?Admitted From: Home ?Disposition:  SNF ? ?Recommendations for Outpatient Follow-up:  ?Follow up with PCP in 1-2 weeks ?Follow up with orthopedics 2 weeks ? ?Home Health: No ?Equipment/Devices: Oxygen 3 L via nasal cannula ? ?Discharge Condition: Stable ?CODE STATUS: Full ?Diet recommendation: Regular ? ?Brief/Interim Summary: ? ?81 year old female with history significant for interstitial lung disease, chronic hypoxic respiratory failure on 3 L nasal cannula, permanent atrial fibrillation anticoagulated on Eliquis, hypertension, hyperlipidemia who presents to ED after mechanical fall and subsequent severe right hip pain. ?  ?Patient found to have a comminuted right hip fracture.  Orthopedics consulted from ED.  Plan for operative fixation. ?  ?Perioperative risk assessment: ?  ?Per Lyndel Safe perioperative risk assessment the estimated risk probability for perioperative myocardial infarction or cardiac arrest is 0.8%. ?  ?4/7: Patient status post IM nail for intertrochanteric hip fracture.  Tolerated procedure well.  No immediate complications.  Postoperative pain mild to moderate. ?  ?4/8: Postoperative pain mild to moderate.  Worse with physical therapy today.  Significant dizziness upon standing requiring extensive assistance.  Postoperative anemia noted ?  ?4/9: Hemoglobin responded appropriately to blood transfusion.  Hemoglobin 9 this morning ?4/10: Remains dizzy on exertion. Hb 8.6 ?  ? ?4/11: Hemoglobin stable, 9 today.  Stable for discharge to skilled nursing facility. ? ? ?Discharge Diagnoses:  ?Principal Problem: ?  Closed right hip fracture ([place]) ? ?Right hip fracture status post mechanical fall, POA ?Presented to the ED from home via EMS after tripping on her oxygen tubing. ?Right hip fracture seen on x-ray ?Last dose of Eliquis 11 PM  10/15/2021 ?EDP discussed case with Dr. Sharlet Salina, orthopedic surgery ?Status post IM nail 4/6 ?Plan: ?Discharge to skilled nursing facility.  Continue therapy evaluations.  Follow-up outpatient 2 weeks orthopedic surgery ?  ?Acute blood loss anemia in the setting of bone fracture status post IM nail ? ?Baseline hemoglobin appears to be 13 ?Presented with hemoglobin of 11.3. ?Postoperative hemoglobin has decreased to 7.5 ?No obvious signs of blood loss ?Status post 1 unit PRBC 4/8 ?Posttransfusion H&H stable ?Down to 8.6 on 4/10 ?Stable at 9 on 4/11 ?Plan: ?No further transfusion at this time ?Stable for DC to skilled nursing facility ?  ?  ?AKI, possibly prerenal ?Baseline hemoglobin appears to be 0.79 with GFR greater than ?Presented with creatinine of 1.39 with GFR 39. ?Now back to baseline ?Plan: ?Avoid nephrotoxic agents, dehydration and hypotension ?Monitor urine output ?  ?Mild leukocytosis, suspect reactive in the setting of bone fracture ?WBC 11.2 K, afebrile with no evidence of active infective process. ?Now recovered ?Suspect reactive ? ?  ?Interstitial lung disease ?Chronic hypoxic respiratory failure ?Stable, at baseline ?On 3 L continuously at baseline ?  ?Permanent A-fib with slow ventricular response ?Heart rate in the 50s ?Chronic resting bradycardia ?Plan: ?Continue Coreg ?Continue Eliquis ? ?  ?  ?Essential hypertension ?BP control improved ?Plan: ?Continue home agents ? ? ?Discharge Instructions ? ?Discharge Instructions   ? ? Diet - low sodium heart healthy   Complete by: As directed ?  ? Increase activity slowly   Complete by: As directed ?  ? No wound care   Complete by: As directed ?  ? ?  ? ?Allergies as of 10/21/2021   ? ?   Reactions  ? Other Itching  ? Phlebitis and itching at vein sites  ?  Epinephrine Palpitations  ? Heart races  ? Prevacid [lansoprazole] Palpitations  ? Heart races  ? ?  ? ?  ?Medication List  ?  ? ?STOP taking these medications   ? ?azelastine 0.1 % nasal spray ?Commonly  known as: ASTELIN ?  ?omeprazole 20 MG capsule ?Commonly known as: PRILOSEC ?  ?omeprazole 40 MG capsule ?Commonly known as: PRILOSEC ?  ?spironolactone 25 MG tablet ?Commonly known as: ALDACTONE ?  ?Zytaze 25-500 MG Caps ?Generic drug: Zinc Citrate-Phytase ?  ? ?  ? ?TAKE these medications   ? ?acetaminophen 500 MG tablet ?Commonly known as: TYLENOL ?Take 500 mg by mouth every 6 (six) hours as needed. ?  ?amLODipine 5 MG tablet ?Commonly known as: NORVASC ?Take 5 mg by mouth daily. ?What changed: Another medication with the same name was removed. Continue taking this medication, and follow the directions you see here. ?  ?apixaban 5 MG Tabs tablet ?Commonly known as: ELIQUIS ?Take 5 mg by mouth 2 (two) times daily. ?  ?Apple Cider Vinegar 300 MG Tabs ?Take 1 tablet by mouth daily. ?  ?atorvastatin 40 MG tablet ?Commonly known as: LIPITOR ?Take by mouth. ?What changed: Another medication with the same name was removed. Continue taking this medication, and follow the directions you see here. ?  ?brimonidine 0.1 % Soln ?Commonly known as: ALPHAGAN P ?Place 1 drop into the right eye 2 (two) times daily. ?  ?Brinzolamide-Brimonidine 1-0.2 % Susp ?Place 1 drop into the left eye 2 (two) times daily. ?  ?carvedilol 6.25 MG tablet ?Commonly known as: COREG ?Take 1 tablet (6.25 mg total) by mouth 2 (two) times daily with a meal. ?  ?cetirizine 10 MG tablet ?Commonly known as: ZYRTEC ?Take 10 mg by mouth daily. ?  ?Cholecalciferol 50 MCG (2000 UT) Caps ?Take 3 capsules by mouth in the morning. ?  ?citalopram 20 MG tablet ?Commonly known as: CELEXA ?Take 10 mg by mouth daily. ?  ?famotidine 40 MG tablet ?Commonly known as: PEPCID ?Take 40 mg by mouth at bedtime. ?  ?fluticasone 50 MCG/ACT nasal spray ?Commonly known as: FLONASE ?Place 2 sprays into the nose daily. ?  ?furosemide 20 MG tablet ?Commonly known as: LASIX ?Take 20 mg by mouth daily. ?What changed: Another medication with the same name was removed. Continue taking  this medication, and follow the directions you see here. ?  ?gabapentin 300 MG capsule ?Commonly known as: Neurontin ?Take 1 capsule (300 mg total) by mouth 2 (two) times daily for 14 days. ?  ?HYDROcodone-acetaminophen 5-325 MG tablet ?Commonly known as: NORCO/VICODIN ?Take 1-2 tablets by mouth every 6 (six) hours as needed for up to 3 days for moderate pain or severe pain. SNF use only ?  ?losartan 50 MG tablet ?Commonly known as: COZAAR ?Take 1 tablet by mouth daily. ?What changed: Another medication with the same name was removed. Continue taking this medication, and follow the directions you see here. ?  ?methocarbamol 500 MG tablet ?Commonly known as: ROBAXIN ?Take 1 tablet (500 mg total) by mouth every 8 (eight) hours as needed for muscle spasms. ?  ?multivitamin-iron-minerals-folic acid chewable tablet ?Chew 1 tablet by mouth daily. ?  ?pantoprazole 40 MG tablet ?Commonly known as: PROTONIX ?Take 40 mg by mouth daily. ?  ?predniSONE 1 MG tablet ?Commonly known as: DELTASONE ?Take 1 tablet by mouth See admin instructions. Take 9 tablets (9 mg total) by mouth once daily for 14 days, THEN 8 tablets (8 mg total) once daily for 14 days,  THEN 7 tablets (7 mg total) once daily for 14 days, THEN 6 tablets (6 mg total) once daily for 14 days, THEN 5 tablets (5 mg total) once daily for 14 days, THEN 4 tablets (4 mg total) once daily for 14 days, THEN 3 tablets (3 mg total) once daily for 14 days, THEN 2 tablets (2 mg total) once daily for 14 days, THEN 1 tablet (1 mg total) once daily for 14 days. ?What changed: Another medication with the same name was added. Make sure you understand how and when to take each. ?  ?predniSONE 1 MG tablet ?Commonly known as: DELTASONE ?Take 7 tablets (7 mg total) by mouth daily with breakfast for 14 days. ?Start taking on: October 22, 2021 ?What changed: You were already taking a medication with the same name, and this prescription was added. Make sure you understand how and when to take  each. ?  ?PROBIOTIC DAILY PO ?Take by mouth daily. ?  ?senna-docusate 8.6-50 MG tablet ?Commonly known as: Senokot-S ?Take 1 tablet by mouth 2 (two) times daily. ?  ?Systane Nighttime Oint ?Apply 1 applicat

## 2021-10-21 NOTE — Progress Notes (Signed)
EMS transported pt from [facility] to [facility].  ?

## 2021-11-18 ENCOUNTER — Other Ambulatory Visit: Payer: Self-pay | Admitting: Orthopaedic Surgery

## 2021-11-18 ENCOUNTER — Ambulatory Visit
Admission: RE | Admit: 2021-11-18 | Discharge: 2021-11-18 | Disposition: A | Discharge status: Home / Self Care | Payer: Medicare PPO | Source: Ambulatory Visit | Attending: Orthopaedic Surgery | Admitting: Orthopaedic Surgery

## 2021-11-18 DIAGNOSIS — M7989 Other specified soft tissue disorders: Secondary | ICD-10-CM | POA: Diagnosis present

## 2021-12-18 ENCOUNTER — Other Ambulatory Visit: Payer: Self-pay | Admitting: Neurosurgery

## 2021-12-18 DIAGNOSIS — M48062 Spinal stenosis, lumbar region with neurogenic claudication: Secondary | ICD-10-CM

## 2021-12-25 ENCOUNTER — Ambulatory Visit
Admission: RE | Admit: 2021-12-25 | Discharge: 2021-12-25 | Disposition: A | Discharge status: Home / Self Care | Payer: Medicare PPO | Source: Ambulatory Visit | Attending: Neurosurgery | Admitting: Neurosurgery

## 2021-12-25 DIAGNOSIS — M48062 Spinal stenosis, lumbar region with neurogenic claudication: Secondary | ICD-10-CM | POA: Diagnosis present

## 2022-01-12 ENCOUNTER — Ambulatory Visit: Payer: Medicare PPO | Attending: Orthopedic Surgery

## 2022-01-12 DIAGNOSIS — M5459 Other low back pain: Secondary | ICD-10-CM | POA: Diagnosis not present

## 2022-01-12 DIAGNOSIS — R262 Difficulty in walking, not elsewhere classified: Secondary | ICD-10-CM | POA: Diagnosis present

## 2022-01-12 DIAGNOSIS — Z9181 History of falling: Secondary | ICD-10-CM | POA: Diagnosis present

## 2022-01-12 DIAGNOSIS — M5431 Sciatica, right side: Secondary | ICD-10-CM | POA: Diagnosis present

## 2022-01-12 DIAGNOSIS — M6281 Muscle weakness (generalized): Secondary | ICD-10-CM | POA: Insufficient documentation

## 2022-01-12 DIAGNOSIS — M5417 Radiculopathy, lumbosacral region: Secondary | ICD-10-CM | POA: Diagnosis present

## 2022-01-12 NOTE — Therapy (Signed)
[facility] [address], [place], [address] Phone: [phone]   Fax:  [phone]  Physical Therapy Evaluation  Patient Details  Name: Joyce Evans MRN: [id number] Date of Birth: 03/04/1941 Referring Provider (PT): Leonie Green, NP   Encounter Date: 01/12/2022   PT End of Session - 01/12/22 0803     Visit Number 1    Number of Visits 17    Date for PT Re-Evaluation 03/12/22    Authorization Type 1    Authorization Time Period 10    PT Start Time 0803    PT Stop Time 0846    PT Time Calculation (min) 43 min    Equipment Utilized During Treatment Gait belt   rw, wc, O2   Activity Tolerance Patient tolerated treatment well;Patient limited by pain    Behavior During Therapy [facility] for tasks assessed/performed             Past Medical History:  Diagnosis Date   Arthritis    osteo - shoulders, fingers   Atrial fibrillation ([place])    Dizziness    thinks because of diuretic   Family history of adverse reaction to anesthesia    daughter -PONV   Gallstones 2016, 2017   GERD (gastroesophageal reflux disease)    Glaucoma    Heart murmur    history of   Hypertension    Melanoma ([place]) 04/20/2016   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 Excised: 05/19/2016, margins free.   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   Tricuspid valve disorder    leak    Past Surgical History:  Procedure Laterality Date   ABDOMINAL HYSTERECTOMY  1987   partial   CARDIAC CATHETERIZATION  2010   [facility]   CATARACT EXTRACTION W/PHACO Right 08/28/2015   Procedure: CATARACT EXTRACTION PHACO AND INTRAOCULAR LENS PLACEMENT ([place]);  Surgeon: Leandrew Koyanagi, MD;  Location: [place];  Service: Ophthalmology;  Laterality: Right;  TORIC   INTRAMEDULLARY (IM) NAIL  INTERTROCHANTERIC Right 10/16/2021   Procedure: INTRAMEDULLARY (IM) NAIL INTERTROCHANTRIC;  Surgeon: Renee Harder, MD;  Location: [facility];  Service: Orthopedics;  Laterality: Right;   MELANOMA EXCISION Left 05/19/2016   MIS. Left mid. lat. tricep. Margins free   MOHS SURGERY  2013   TONSILLECTOMY  1971    There were no vitals filed for this visit.    Subjective Assessment - 01/12/22 0807     Subjective Low back: 0/10 currently, 6/10 at worst for the past 2 months. R LE: 6/10 currently (to posterior R thigh, not past the knee), 9/10 at worst for the past 2 months.    Pertinent History Low back pain. S/P IM nail placement for right intertrochanteric hip fracture with Dr. Sharlet Salina on 10/16/2021. Pt slipped on the kithen floor. Pt was getting something from the microwave and pt tripped on her O2 tube. Pt was independent with ambulation prior to her fall. Pt fell before in the woods and pt tripped over a root 3 years ago (no injuries per pt). Pt has been using supplemental O2 for the past 2 years in November. Pt has interstitial lung disease (idiopathic pulmonary fibrosis). Recent breathing test is stable. Back pain began when pt was in Rehab in [place] [facility]. Feels  like it is sciatical. Pt radiates along the sciatic nerve and R L 5 dermatome. Pt is healing fine as far as her R hip surgery is concerned. Also has R medial knee pain due to arthritis. Had home health PT which involves seated knee extension, hip flexion, standing hip abduction, extension. Pt did some walking wiht a rw. Feels a lot of pain all over.  Walking too far is limited due to her O2 situation.    Patient Stated Goals Be in the kitchen without her back and R LE bothering her.  Be able to get back to independent walking so she can hike and go to the grocery store.    Currently in Pain? Yes    Pain Score 6     Pain Type Acute pain    Pain Radiating Towards R sciatic and R L5 dermatome    Pain Onset More than a month ago    Pain  Frequency Occasional    Aggravating Factors  supine with pressure on posterior R leg, initial walking, Standing in the kitchen, cooking    Pain Relieving Factors sitting, Walking for a while, bending over                [facility] PT Assessment - 01/12/22 0824       Assessment   Medical Diagnosis Spinal Stenosis of lumbar region    Referring Provider (PT) Leonie Green, NP    Onset Date/Surgical Date 01/06/22   Date PT referral signed   Hand Dominance Right    Prior Therapy Home health PT      Precautions   Precaution Comments fall risk; SpO2 not under 90 %      Restrictions   Other Position/Activity Restrictions No known weight bearing restrictions      Balance Screen   Has the patient fallen in the past 6 months Yes    How many times? 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 L lumbar convexity, R lateral shirt around thoracolumbar junction, L LE weight shift,      AROM   Lumbar Flexion WFL    Lumbar Extension WFL with increased R low back/posterior hip pain and R LE pain    Lumbar - Right Side Bend WFL with R lateral thigh pain    Lumbar - Left Side Bend limited    Lumbar - Right Rotation WFL with R posterior hip pain with overpressure    Lumbar - Left Rotation WFL with R posterior hip pain      Strength   Right Hip Flexion 4-/5    Right Hip Extension 4+/5   seated manually resisted   Right Hip ABduction 4/5   seated manually resisted clamshell isometric   Left Hip Flexion 4-/5    Left Hip Extension 4+/5   seated manually resisted   Left Hip ABduction 4/5   seated manually resisted clamshell  isometric   Right Knee Flexion 4/5    Right Knee Extension 4+/5    Left Knee Flexion 4/5    Left Knee Extension 4+/5  Palpation   Palpation comment TTP R posterior hip around R piriformis, TTP R low back, B lumbar paraspinal muscle tension      Ambulation/Gait   Gait Comments Gait with RW and O2: ntalgic, decreased stance R LE with R trunk side bend, L pelvic drop. Decreased R glute med strength and trunk strength observed.                        Objective measurements completed on examination: See above findings.        SpO2 not under 90 %  Pt in WC, using supplemental O2, 2 L, usually 3 L. Uses 4 L during activity. SpO2 98% currently    TTP R posterior hip around R piriformis, TTP R low back, B lumbar paraspinal muscle tension   Gait with RW and O2: ntalgic, decreased stance R LE with R trunk side bend, L pelvic drop. Decreased R glute med strength and trunk strength observed.    Response to treatment Fair tolerance to today's session. Reproduction of symptoms during gait.    Clinical impression Pt is a 81 year old female who came to physical therapy secondary to low back and R LE pain along both the sciatic nerve and R L5 dermatome. Pt also demonstrates with altered gait pattern, posture, reproduction of symptoms with lumbar AROM, decreased trunk strength, TTP R low back and posterior hip, increased B lumbar paraspinal muscle tension, and difficulty performing tasks which involve standing and walking as well as pressure to R posterior leg due to pain. Pt will benefit from skilled physical therapy services to address the aforementioned deficits.               PT Education - 01/12/22 0923     Education Details Plan of car    Person(s) Educated Patient    Methods Explanation    Comprehension Verbalized understanding              PT Short Term Goals - 01/12/22 0912       PT SHORT TERM GOAL #1   Title Pt will be independent with her  initial HEP to decrease pain, improve strength, function, and ability to stand and ambulate more comfortably.    Time 3    Period Weeks    Status New    Target Date 02/05/22               PT Long Term Goals - 01/12/22 0913       PT LONG TERM GOAL #1   Title Pt will have a decrease in low back pain to 3/10 or less at worst to promote ability to stand and ambulate with less difficulty.    Baseline 6/10 low back pain at worst for the past 2 months (01/12/2022)    Time 8    Period Weeks    Status New    Target Date 03/12/22      PT LONG TERM GOAL #2   Title Pt will have a decrease in R LE pain to 3/10 or less at worst to promote ability to stand and ambulate will less difficulty.    Baseline 9/10 R LE pain at worst (01/12/2022)    Time 8    Period Weeks    Status New    Target Date 03/12/22      PT LONG TERM GOAL #3   Title Pt will improve her FOTO score by at least 10 points as a demontsration of  improved function.    Baseline Lumbar Spine FOTO 39 (01/12/2022)    Time 8    Period Weeks    Status New    Target Date 03/12/22      PT LONG TERM GOAL #4   Title Pt will be able to ambulate at least 100 ft independently to promote mobility and return to prior level of function.    Baseline Able to ambulate with rw short distance, about 40 ft with reproduction of pain. (01/12/2022)    Time 8    Period Weeks    Status New    Target Date 03/12/22                    Plan - 01/12/22 0858     Clinical Impression Statement Pt is a 81 year old female who came to physical therapy secondary to low back and R LE pain along both the sciatic nerve and R L5 dermatome. Pt also demonstrates with altered gait pattern, posture, reproduction of symptoms with lumbar AROM, decreased trunk strength, TTP R low back and posterior hip, increased B lumbar paraspinal muscle tension, and difficulty performing tasks which involve standing and walking as well as pressure to R posterior leg due to pain.  Pt will benefit from skilled physical therapy services to address the aforementioned deficits.    Personal Factors and Comorbidities Comorbidity 3+;Age;Time since onset of injury/illness/exacerbation;Fitness    Comorbidities Arthritis, atrial fibrillation, HTN, idiopathic pulmonary fibrosis    Examination-Activity Limitations Stairs;Bed Mobility;Stand;Sit;Lift;Transfers;Locomotion Level;Carry    Stability/Clinical Decision Making Stable/Uncomplicated    Clinical Decision Making Low    Rehab Potential Fair    PT Frequency 2x / week    PT Duration 8 weeks    PT Treatment/Interventions Therapeutic activities;Therapeutic exercise;Gait training;Balance training;Neuromuscular re-education;Patient/family education;Manual techniques;Dry needling;Electrical Stimulation;Iontophoresis '4mg'$ /ml Dexamethasone    PT Next Visit Plan Posture, thoracic extension, trunk and glute strength, lumbopelvic control during gait, manual techniques, modalities PRN    Consulted and Agree with Plan of Care Patient             Patient will benefit from skilled therapeutic intervention in order to improve the following deficits and impairments:     Visit Diagnosis: Other low back pain - Plan: PT plan of care cert/re-cert  Sciatica, right side - Plan: PT plan of care cert/re-cert  Radiculopathy, lumbosacral region - Plan: PT plan of care cert/re-cert  Difficulty in walking, not elsewhere classified - Plan: PT plan of care cert/re-cert  Muscle weakness (generalized) - Plan: PT plan of care cert/re-cert  History of falling - Plan: PT plan of care cert/re-cert     Problem List Patient Active Problem List   Diagnosis Date Noted   Closed right hip fracture ([place]) 10/16/2021   TIA (transient ischemic attack) 06/17/2020   Gallstones     Joneen Boers PT, DPT  01/12/2022, 9:29 AM  [facility] [address] [place], [address] Phone:  [phone]   Fax:  [phone]  Name: Joyce Evans MRN: [id number] Date of Birth: 04-08-1941

## 2022-01-14 ENCOUNTER — Ambulatory Visit: Payer: Medicare PPO | Admitting: Internal Medicine

## 2022-01-16 ENCOUNTER — Encounter: Payer: Medicare PPO | Attending: Internal Medicine | Admitting: Physician Assistant

## 2022-01-16 DIAGNOSIS — I480 Paroxysmal atrial fibrillation: Secondary | ICD-10-CM | POA: Diagnosis not present

## 2022-01-16 DIAGNOSIS — Z7901 Long term (current) use of anticoagulants: Secondary | ICD-10-CM | POA: Insufficient documentation

## 2022-01-16 DIAGNOSIS — J8417 Interstitial lung disease with progressive fibrotic phenotype in diseases classified elsewhere: Secondary | ICD-10-CM | POA: Insufficient documentation

## 2022-01-16 DIAGNOSIS — Z7952 Long term (current) use of systemic steroids: Secondary | ICD-10-CM | POA: Insufficient documentation

## 2022-01-16 DIAGNOSIS — I129 Hypertensive chronic kidney disease with stage 1 through stage 4 chronic kidney disease, or unspecified chronic kidney disease: Secondary | ICD-10-CM | POA: Insufficient documentation

## 2022-01-16 DIAGNOSIS — N183 Chronic kidney disease, stage 3 unspecified: Secondary | ICD-10-CM | POA: Diagnosis not present

## 2022-01-16 DIAGNOSIS — S81812A Laceration without foreign body, left lower leg, initial encounter: Secondary | ICD-10-CM | POA: Insufficient documentation

## 2022-01-16 DIAGNOSIS — W2209XA Striking against other stationary object, initial encounter: Secondary | ICD-10-CM | POA: Diagnosis not present

## 2022-01-16 DIAGNOSIS — L97822 Non-pressure chronic ulcer of other part of left lower leg with fat layer exposed: Secondary | ICD-10-CM | POA: Insufficient documentation

## 2022-01-16 DIAGNOSIS — Z9981 Dependence on supplemental oxygen: Secondary | ICD-10-CM | POA: Diagnosis not present

## 2022-01-16 NOTE — Progress Notes (Signed)
Joyce Evans, Joyce Evans ([id number]) Visit Report for 01/16/2022 Allergy List Details Patient Name: Joyce Evans, Joyce Evans Date of Service: 01/16/2022 12:45 PM Medical Record Number: [id number] Patient Account Number: [id number] Date of Birth/Sex: August 15, 1940 (80 y.o. F) Treating RN: Cornell Barman Primary Care Kamrie Fanton: Other Clinician: Referring Bari Handshoe: Treating Lyra Alaimo/Extender: Skipper Cliche in Treatment: 0 Allergies Active Allergies Prevacid Reaction: heart racing magnesium carbonate, magnesium oxide, mag sulfate Allergy Notes Electronic Signature(s) Signed: 01/16/2022 11:02:08 AM By: Gretta Cool, BSN, RN, CWS, Kim RN, BSN Entered By: Gretta Cool, BSN, RN, CWS, Kim on 01/16/2022 11:02:08

## 2022-01-20 ENCOUNTER — Ambulatory Visit: Payer: Medicare PPO

## 2022-01-20 DIAGNOSIS — R262 Difficulty in walking, not elsewhere classified: Secondary | ICD-10-CM

## 2022-01-20 DIAGNOSIS — Z9181 History of falling: Secondary | ICD-10-CM

## 2022-01-20 DIAGNOSIS — M5431 Sciatica, right side: Secondary | ICD-10-CM

## 2022-01-20 DIAGNOSIS — M5459 Other low back pain: Secondary | ICD-10-CM | POA: Diagnosis not present

## 2022-01-20 DIAGNOSIS — M6281 Muscle weakness (generalized): Secondary | ICD-10-CM

## 2022-01-20 DIAGNOSIS — M5417 Radiculopathy, lumbosacral region: Secondary | ICD-10-CM

## 2022-01-20 NOTE — Therapy (Signed)
OUTPATIENT PHYSICAL THERAPY TREATMENT NOTE   Patient Name: Joyce Evans MRN: [id number] DOB:04-05-1941, 81 y.o., female Today's Date: 01/20/2022  PCP: Derinda Late, MD  REFERRING PROVIDER: Phylliss Bob, MD   PT End of Session - 01/20/22 1146     Visit Number 2    Number of Visits 17    Date for PT Re-Evaluation 03/12/22    Authorization Type 2    Authorization Time Period 10    PT Start Time 1146    PT Stop Time 1231    PT Time Calculation (min) 45 min    Equipment Utilized During Treatment --   rw, wc, O2   Activity Tolerance Patient tolerated treatment well    Behavior During Therapy WFL for tasks assessed/performed             Past Medical History:  Diagnosis Date   Arthritis    osteo - shoulders, fingers   Atrial fibrillation ([place])    Dizziness    thinks because of diuretic   Family history of adverse reaction to anesthesia    daughter -PONV   Gallstones 2016, 2017   GERD (gastroesophageal reflux disease)    Glaucoma    Heart murmur    history of   Hypertension    Melanoma ([place]) 04/20/2016   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 Excised: 05/19/2016, margins free.   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   Tricuspid valve disorder    leak   Past Surgical History:  Procedure Laterality Date   ABDOMINAL HYSTERECTOMY  1987   partial   CARDIAC CATHETERIZATION  2010   [facility]   CATARACT EXTRACTION W/PHACO Right 08/28/2015   Procedure: CATARACT EXTRACTION PHACO AND INTRAOCULAR LENS PLACEMENT ([place]);  Surgeon: Leandrew Koyanagi, MD;  Location: [place];  Service: Ophthalmology;  Laterality: Right;  TORIC   INTRAMEDULLARY (IM) NAIL INTERTROCHANTERIC Right 10/16/2021   Procedure: INTRAMEDULLARY (IM) NAIL INTERTROCHANTRIC;  Surgeon: Renee Harder, MD;  Location: [facility];  Service:  Orthopedics;  Laterality: Right;   MELANOMA EXCISION Left 05/19/2016   MIS. Left mid. lat. tricep. Margins free   MOHS SURGERY  2013   TONSILLECTOMY  1971   Patient Active Problem List   Diagnosis Date Noted   Closed right hip fracture ([place]) 10/16/2021   TIA (transient ischemic attack) 06/17/2020   Gallstones     REFERRING DIAG: Spinal Stenosis of lumbar region  THERAPY DIAG:  Other low back pain  Sciatica, right side  Radiculopathy, lumbosacral region  Difficulty in walking, not elsewhere classified  Muscle weakness (generalized)  History of falling  Rationale for Evaluation and Treatment Rehabilitation  PERTINENT HISTORY: Low back pain. S/P IM nail placement for right intertrochanteric hip fracture with Dr. Sharlet Salina on 10/16/2021. Pt slipped on the kithen floor. Pt was getting something from the microwave and pt tripped on her O2 tube. Pt was independent with ambulation prior to her fall. Pt fell before in the woods and pt tripped over a root 3 years ago (no injuries per pt). Pt has been using supplemental O2 for the past 2 years in November. Pt has interstitial lung disease (idiopathic pulmonary fibrosis). Recent breathing test is stable. Back pain began when pt was in Rehab in [place] [facility]. Feels like it is sciatical. Pt radiates  along the sciatic nerve and R L 5 dermatome. Pt is healing fine as far as her R hip surgery is concerned. Also has R medial knee pain due to arthritis. Had home health PT which involves seated knee extension, hip flexion, standing hip abduction, extension. Pt did some walking wiht a rw. Feels a lot of pain all over. Walking too far is limited due to her O2 situation.  PRECAUTIONS: SpO2 not under 90 %  SUBJECTIVE: Did 15 minutes on the recumbent bike the other day. No back pain currently. R buttocks is a little sore when walking. Has a wound on L anterior leg (has collagen, going to wound clinic at [facility]) which is sore.   PAIN:  Are you having pain?  No     TODAY'S TREATMENT:   Therapeutic exercise  99% SpO2, 3 L, HR 53  Sitting, back unsupported on mat table  Transversus abdominis contraction 10x3 with 5 second holds    Glute max contraction 10x5 seconds for 3 sets   B scapular retraction 10x5 seconds for 2 sets  SLS with B UE assist   R 10x5 seconds   L 10x5 seconds   SpO2 3 L 83%  Increased to 4 L by pt. SpO2 improved to 95% after rest    Pt then decreased to 3 L to save battery power  Seated L trunk SB 2x10 seconds  Seated trunk flexion to the L 5x10 seconds      Improved exercise technique, movement at target joints, use of target muscles after mod verbal, visual, tactile cues.   Manual therapy  Seated STM B lumbar paraspinal and R quadratus lumborum muscles to decrease tension  Seated STM R thoracic paraspinal muscles to decrease tension     Response to treatment Fair tolerance to today's session.      Clinical impression  Worked on trunk and glute strengthening as well as thoracic extension to decrease stress to low back. Weak core and hip muscles observed. Demonstrates increased use of B paraspinal muscles and R quadratus lumborum muscle. Fair tolerance to today's session. Challenges include need for supplemental oxygen and amount of battery power her unit has. Use of 4 L decreases battery power faster. Pt will benefit from continued skilled physical therapy services to decrease pain, improve strength, gait, and function.      PATIENT EDUCATION: Education details: ther-ex, HEP Person educated: Patient Education method: Explanation, Demonstration, Tactile cues, Verbal cues, and Handouts Education comprehension: verbalized understanding and returned demonstration   HOME EXERCISE PROGRAM: Access Code: [email or web address] URL: [email or web address] Date: 01/20/2022 Prepared by: Joneen Boers  Exercises - Seated Transversus Abdominis Bracing  - 1 x daily - 7 x weekly - 3 sets - 10 reps - 5  seconds hold - Seated Gluteal Sets  - 1 x daily - 7 x weekly - 3 sets - 10 reps - 5 seconds hold - Seated Scapular Retraction  - 1 x daily - 7 x weekly - 3 sets - 10 reps - 5 seconds  hold       PT Short Term Goals - 01/12/22 0912       PT SHORT TERM GOAL #1   Title Pt will be independent with her initial HEP to decrease pain, improve strength, function, and ability to stand and ambulate more comfortably.    Time 3    Period Weeks    Status New    Target Date 02/05/22  PT Long Term Goals - 01/12/22 0913       PT LONG TERM GOAL #1   Title Pt will have a decrease in low back pain to 3/10 or less at worst to promote ability to stand and ambulate with less difficulty.    Baseline 6/10 low back pain at worst for the past 2 months (01/12/2022)    Time 8    Period Weeks    Status New    Target Date 03/12/22      PT LONG TERM GOAL #2   Title Pt will have a decrease in R LE pain to 3/10 or less at worst to promote ability to stand and ambulate will less difficulty.    Baseline 9/10 R LE pain at worst (01/12/2022)    Time 8    Period Weeks    Status New    Target Date 03/12/22      PT LONG TERM GOAL #3   Title Pt will improve her FOTO score by at least 10 points as a demontsration of improved function.    Baseline Lumbar Spine FOTO 39 (01/12/2022)    Time 8    Period Weeks    Status New    Target Date 03/12/22      PT LONG TERM GOAL #4   Title Pt will be able to ambulate at least 100 ft independently to promote mobility and return to prior level of function.    Baseline Able to ambulate with rw short distance, about 40 ft with reproduction of pain. (01/12/2022)    Time 8    Period Weeks    Status New    Target Date 03/12/22              Plan - 01/20/22 1139     Clinical Impression Statement Worked on trunk and glute strengthening as well as thoracic extension to decrease stress to low back. Weak core and hip muscles observed. Demonstrates increased use of  B paraspinal muscles and R quadratus lumborum muscle. Fair tolerance to today's session. Challenges include need for supplemental oxygen and amount of battery power her unit has. Use of 4 L decreases battery power faster. Pt will benefit from continued skilled physical therapy services to decrease pain, improve strength, gait, and function.    Personal Factors and Comorbidities Comorbidity 3+;Age;Time since onset of injury/illness/exacerbation;Fitness    Comorbidities Arthritis, atrial fibrillation, HTN, idiopathic pulmonary fibrosis    Examination-Activity Limitations Stairs;Bed Mobility;Stand;Sit;Lift;Transfers;Locomotion Level;Carry    Stability/Clinical Decision Making Stable/Uncomplicated    Rehab Potential Fair    PT Frequency 2x / week    PT Duration 8 weeks    PT Treatment/Interventions Therapeutic activities;Therapeutic exercise;Gait training;Balance training;Neuromuscular re-education;Patient/family education;Manual techniques;Dry needling;Electrical Stimulation;Iontophoresis '4mg'$ /ml Dexamethasone    PT Next Visit Plan Posture, thoracic extension, trunk and glute strength, lumbopelvic control during gait, manual techniques, modalities PRN    Consulted and Agree with Plan of Care Patient              Joneen Boers PT, DPT  01/20/2022, 12:52 PM

## 2022-01-20 NOTE — Progress Notes (Signed)
ERINN, MENDOSA ([id number]) Visit Report for 01/16/2022 Abuse Risk Screen Details Patient Name: Joyce Evans Date of Service: 01/16/2022 12:45 PM Medical Record Number: [id number] Patient Account Number: [id number] Date of Birth/Sex: 06-Sep-1940 (81 y.o. F) Treating RN: Joyce Evans Primary Care Melaina Howerton: Derinda Late Other Clinician: Referring Shiara Mcgough: Melynda Ripple Treating Berkeley Vanaken/Extender: Skipper Cliche in Treatment: 0 Abuse Risk Screen Items Answer ABUSE RISK SCREEN: Has anyone close to you tried to hurt or harm you recentlyo No Do you feel uncomfortable with anyone in your familyo No Has anyone forced you do things that you didnot want to doo No Electronic Signature(s) Signed: 01/20/2022 3:46:53 PM By: Joyce Evans, BSN, RN, CWS, Kim RN, BSN Entered By: Joyce Evans, BSN, RN, CWS, Kim on 01/16/2022 13:19:50 Joyce Evans ([id number]) -------------------------------------------------------------------------------- Activities of Daily Living Details Patient Name: Joyce Evans Date of Service: 01/16/2022 12:45 PM Medical Record Number: [id number] Patient Account Number: [id number] Date of Birth/Sex: [birth year] (81 y.o. F) Treating RN: Joyce Evans Primary Care Cloria Ciresi: Derinda Late Other Clinician: Referring Tayra Dawe: Melynda Ripple Treating Ari Engelbrecht/Extender: Skipper Cliche in Treatment: 0 Activities of Daily Living Items Answer Activities of Daily Living (Please select one for each item) Drive Automobile Not Able Take Medications Completely Able Use Telephone Completely Able Care for Appearance Completely Able Use Toilet Completely Able Bath / Shower Completely Able Dress Self Completely Able Feed Self Completely Able Walk Need Assistance Get In / Out Bed Completely Able Housework Completely Able Prepare Meals Need Assistance Handle Money Completely Able Shop for Self Completely Able Electronic Signature(s) Signed: 01/20/2022 3:46:53 PM By: Joyce Evans, BSN, RN,  CWS, Kim RN, BSN Entered By: Joyce Evans, BSN, RN, CWS, Kim on 01/16/2022 13:20:38 Joyce Evans ([id number]) -------------------------------------------------------------------------------- Education Screening Details Patient Name: Joyce Evans Date of Service: 01/16/2022 12:45 PM Medical Record Number: [id number] Patient Account Number: [id number] Date of Birth/Sex: Dec 05, 1940 (81 y.o. F) Treating RN: Joyce Evans Primary Care Alea Ryer: Derinda Late Other Clinician: Referring Jacub Waiters: Melynda Ripple Treating Steffanie Mingle/Extender: Skipper Cliche in Treatment: 0 Primary Learner Assessed: Patient Learning Preferences/Education Level/Primary Language Learning Preference: Explanation, Demonstration Highest Education Level: College or Above Preferred Language: English Cognitive Barrier Language Barrier: No Translator Needed: No Memory Deficit: No Emotional Barrier: No Physical Barrier Impaired Vision: No Impaired Hearing: No Decreased Hand dexterity: No Knowledge/Comprehension Knowledge Level: High Comprehension Level: High Ability to understand written instructions: High Ability to understand verbal instructions: High Motivation Anxiety Level: Calm Cooperation: Cooperative Education Importance: Acknowledges Need Interest in Health Problems: Asks Questions Perception: Coherent Willingness to Engage in Self-Management High Activities: Readiness to Engage in Self-Management High Activities: Engineer, maintenance) Signed: 01/20/2022 3:46:53 PM By: Joyce Evans, BSN, RN, CWS, Kim RN, BSN Entered By: Joyce Evans, BSN, RN, CWS, Kim on 01/16/2022 13:21:15 Joyce Evans ([id number]) -------------------------------------------------------------------------------- Fall Risk Assessment Details Patient Name: Joyce Evans Date of Service: 01/16/2022 12:45 PM Medical Record Number: [id number] Patient Account Number: [id number] Date of Birth/Sex: 1940/07/18 (81 y.o. F) Treating RN: Joyce Evans Primary Care Shelby Peltz: Derinda Late Other Clinician: Referring Brystol Wasilewski: Melynda Ripple Treating Precious Gilchrest/Extender: Skipper Cliche in Treatment: 0 Fall Risk Assessment Items Have you had 2 or more falls in the last 12 monthso 0 Yes Have you had any fall that resulted in injury in the last 12 monthso 0 Yes FALLS RISK SCREEN History of falling - immediate or within 3 months 0 No Secondary diagnosis (Do you have 2 or more medical diagnoseso) 15 Yes Ambulatory aid None/bed rest/wheelchair/nurse 0 No Crutches/cane/walker 15 Yes Furniture 0 No Intravenous  therapy Access/Saline/Heparin Lock 0 No Gait/Transferring Normal/ bed rest/ wheelchair 0 No Weak (short steps with or without shuffle, stooped but able to lift head while walking, may 10 Yes seek support from furniture) Impaired (short steps with shuffle, may have difficulty arising from chair, head down, impaired 0 No balance) Mental Status Oriented to own ability 0 Yes Electronic Signature(s) Signed: 01/20/2022 3:46:53 PM By: Joyce Evans, BSN, RN, CWS, Kim RN, BSN Entered By: Joyce Evans, BSN, RN, CWS, Kim on 01/16/2022 13:22:12 Joyce Evans ([id number]) -------------------------------------------------------------------------------- Foot Assessment Details Patient Name: Joyce Evans Date of Service: 01/16/2022 12:45 PM Medical Record Number: [id number] Patient Account Number: [id number] Date of Birth/Sex: [birth year] (81 y.o. F) Treating RN: Joyce Evans Primary Care Gemma Ruan: Derinda Late Other Clinician: Referring Channie Bostick: Melynda Ripple Treating Jenefer Woerner/Extender: Skipper Cliche in Treatment: 0 Foot Assessment Items Site Locations + = Sensation present, - = Sensation absent, C = Callus, U = Ulcer R = Redness, W = Warmth, M = Maceration, PU = Pre-ulcerative lesion F = Fissure, S = Swelling, D = Dryness Assessment Right: Left: Other Deformity: No No Prior Foot Ulcer: No No Prior Amputation: No No Charcot  Joint: No No Ambulatory Status: Ambulatory With Help Assistance Device: Walker GaitEnergy manager) Signed: 01/20/2022 3:46:53 PM By: Joyce Evans, BSN, RN, CWS, Kim RN, BSN Entered By: Joyce Evans, BSN, RN, CWS, Kim on 01/16/2022 13:22:48 Joyce Evans ([id number]) -------------------------------------------------------------------------------- Nutrition Risk Screening Details Patient Name: Joyce Evans Date of Service: 01/16/2022 12:45 PM Medical Record Number: [id number] Patient Account Number: [id number] Date of Birth/Sex: [birth year] (80 y.o. F) Treating RN: Joyce Evans Primary Care Maurisa Tesmer: Derinda Late Other Clinician: Referring Meryl Hubers: Melynda Ripple Treating Lilliam Chamblee/Extender: Skipper Cliche in Treatment: 0 Height (in): 61 Weight (lbs): 150.8 Body Mass Index (BMI): 28.5 Nutrition Risk Screening Items Score Screening NUTRITION RISK SCREEN: I have an illness or condition that made me change the kind and/or amount of food I eat 0 No I eat fewer than two meals per day 0 No I eat few fruits and vegetables, or milk products 0 No I have three or more drinks of beer, liquor or wine almost every day 0 No I have tooth or mouth problems that make it hard for me to eat 0 No I don't always have enough money to buy the food I need 0 No I eat alone most of the time 0 No I take three or more different prescribed or over-the-counter drugs a day 1 Yes Without wanting to, I have lost or gained 10 pounds in the last six months 0 No I am not always physically able to shop, cook and/or feed myself 0 No Nutrition Protocols Good Risk Protocol 0 No interventions needed Moderate Risk Protocol High Risk Proctocol Risk Level: Good Risk Score: 1 Electronic Signature(s) Signed: 01/20/2022 3:46:53 PM By: Joyce Evans, BSN, RN, CWS, Kim RN, BSN Entered By: Joyce Evans, BSN, RN, CWS, Kim on 01/16/2022 13:22:25

## 2022-01-20 NOTE — Progress Notes (Signed)
RENELL, COAXUM ([id number]) Visit Report for 01/16/2022 Chief Complaint Document Details Patient Name: Joyce Evans, Joyce Evans Date of Service: 01/16/2022 12:45 PM Medical Record Number: [id number] Patient Account Number: [id number] Date of Birth/Sex: 1941-04-27 (80 y.o. F) Treating RN: Cornell Barman Primary Care Provider: Derinda Late Other Clinician: Referring Provider: Melynda Ripple Treating Provider/Extender: Skipper Cliche in Treatment: 0 Information Obtained from: Patient Chief Complaint Left LE Ulcer Electronic Signature(s) Signed: 01/16/2022 1:31:01 PM By: Worthy Keeler PA-C Entered By: Worthy Keeler on 01/16/2022 13:31:01 [name] ([id number]) -------------------------------------------------------------------------------- Debridement Details Patient Name: [name] Date of Service: 01/16/2022 12:45 PM Medical Record Number: [id number] Patient Account Number: [id number] Date of Birth/Sex: 06/05/1941 (80 y.o. F) Treating RN: Cornell Barman Primary Care Provider: Derinda Late Other Clinician: Referring Provider: Melynda Ripple Treating Provider/Extender: Skipper Cliche in Treatment: 0 Debridement Performed for Wound #1 Left,Midline Lower Leg Assessment: Performed By: Physician Tommie Sams., PA-C Debridement Type: Debridement Level of Consciousness (Pre- Awake and Alert procedure): Pre-procedure Verification/Time Out Yes - 13:37 Taken: Total Area Debrided (L x W): 1.5 (cm) x 1.8 (cm) = 2.7 (cm) Tissue and other material Viable, Non-Viable, Subcutaneous, Skin: Dermis debrided: Level: Skin/Subcutaneous Tissue Debridement Description: Excisional Instrument: Curette Bleeding: Minimum Hemostasis Achieved: Pressure Response to Treatment: Procedure was tolerated well Level of Consciousness (Post- Awake and Alert procedure): Post Debridement Measurements of Total Wound Length: (cm) 1.4 Width: (cm) 1.8 Depth: (cm) 0.2 Volume: (cm)  0.396 Character of Wound/Ulcer Post Debridement: Stable Post Procedure Diagnosis Same as Pre-procedure Electronic Signature(s) Signed: 01/16/2022 5:04:18 PM By: Worthy Keeler PA-C Signed: 01/20/2022 3:46:53 PM By: Gretta Cool, BSN, RN, CWS, Kim RN, BSN Entered By: Gretta Cool, BSN, RN, CWS, Kim on 01/16/2022 13:38:10 [name] ([id number]) -------------------------------------------------------------------------------- HPI Details Patient Name: [name] Date of Service: 01/16/2022 12:45 PM Medical Record Number: [id number] Patient Account Number: [id number] Date of Birth/Sex: [birth year] (80 y.o. F) Treating RN: Cornell Barman Primary Care Provider: Derinda Late Other Clinician: Referring Provider: Melynda Ripple Treating Provider/Extender: Skipper Cliche in Treatment: 0 History of Present Illness HPI Description: 01-16-2022 upon evaluation patient appears for initial evaluation here in the clinic today concerning issues that she has been having with a wound over the anterior portion of her left lower extremity. This is a laceration that occurred on 12-11-2021. She states that she hit this on a car door. Subsequently she sustained a skin tear which has been difficult to heal since that point. No sutures have been placed. She has been using Neosporin and try to leave it open to air which was the recommendation from the physician that she saw. With that being said she tells me that its been difficult to get this to heal and she subsequently ended up with an appointment here in order for [place] to see if there is anything we can recommend to speed up the healing process. She does have long-term use of prednisone due to interstitial lung disease as well as taking fluid pills for lower extremity swelling. Her swelling is not too bad today and to be honest I think it may be better for her to be able to change the dressing more frequently than have a compression wrap on currently. We may need to wrap  her though if we do not see the improvement that we really want to see going forward we will keep a close eye on things here. Patient has a history of interstitial lung disease, hypertension, chronic kidney disease stage III, atrial fibrillation, long-term  use of anticoagulant therapy, and dependence on supplemental oxygen. She is also on prednisone long-term for the lung disease. Electronic Signature(s) Signed: 01/16/2022 2:01:33 PM By: Worthy Keeler PA-C Entered By: Worthy Keeler on 01/16/2022 14:01:32 [name] ([id number]) -------------------------------------------------------------------------------- Physical Exam Details Patient Name: [name] Date of Service: 01/16/2022 12:45 PM Medical Record Number: [id number] Patient Account Number: [id number] Date of Birth/Sex: [birth year] (80 y.o. F) Treating RN: Cornell Barman Primary Care Provider: Derinda Late Other Clinician: Referring Provider: Melynda Ripple Treating Provider/Extender: Skipper Cliche in Treatment: 0 Constitutional patient is hypertensive.. pulse regular and within target range for patient.Marland Kitchen respirations regular, non-labored and within target range for patient.Marland Kitchen temperature within target range for patient.. Well-nourished and well-hydrated in no acute distress. Eyes conjunctiva clear no eyelid edema noted. pupils equal round and reactive to light and accommodation. Ears, Nose, Mouth, and Throat no gross abnormality of ear auricles or external auditory canals. normal hearing noted during conversation. mucus membranes moist. Respiratory normal breathing without difficulty. Cardiovascular 2+ dorsalis pedis/posterior tibialis pulses. 1+ pitting edema of the bilateral lower extremities. Musculoskeletal normal gait and posture. no significant deformity or arthritic changes, no loss or range of motion, no clubbing. Psychiatric this patient is able to make decisions and demonstrates good insight into  disease process. Alert and Oriented x 3. pleasant and cooperative. Notes Upon evaluation today patient's wound actually shows mainly necrotic tissue covering the surface of the wound. I discussed that with her today. I do believe that sharp debridement is good to be necessary to clearway the necrotic debris and allow this to begin healing more appropriately. She voiced understanding. She does normally wear compression socks but has not been wearing them with the current issue going on. For that reason I do believe Tubigrip would be easier for them to get off and on for dressing changes which I would like for them to do 3 times per week. Electronic Signature(s) Signed: 01/16/2022 2:02:19 PM By: Worthy Keeler PA-C Entered By: Worthy Keeler on 01/16/2022 14:02:19 [name] ([id number]) -------------------------------------------------------------------------------- Physician Orders Details Patient Name: [name] Date of Service: 01/16/2022 12:45 PM Medical Record Number: [id number] Patient Account Number: [id number] Date of Birth/Sex: [birth year] (80 y.o. 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 Bathing/ Shower/ Hygiene o May  shower; gently cleanse wound with antibacterial soap, rinse and pat dry prior to dressing wounds Wound Treatment Wound #1 - Lower Leg Wound Laterality: Left, Midline Primary Dressing: Prisma 4.34 (in) 3 x Per Week/30 Days Discharge Instructions: Moisten w/normal saline or sterile water; Cover wound as directed. Do not remove from wound bed. Secondary Dressing: ABD Pad 5x9 (in/in) 3 x Per Week/30 Days Discharge Instructions: Cover with ABD pad Secured With: Conform 4'' - Conforming Stretch Gauze Bandage 4x75 (in/in) 3 x Per Week/30 Days Discharge Instructions: Apply as directed Electronic Signature(s) Signed: 01/16/2022 5:04:18 PM By: Worthy Keeler PA-C Signed: 01/20/2022  3:46:53 PM By: Gretta Cool, BSN, RN, CWS, Kim RN, BSN Entered By: Gretta Cool, BSN, RN, CWS, Kim on 01/16/2022 13:55:33 [name] ([id number]) -------------------------------------------------------------------------------- Problem List Details Patient Name: [name] Date of Service: 01/16/2022 12:45 PM Medical Record Number: [id number] Patient Account Number: [id number] Date of Birth/Sex: 1941/01/25 (80 y.o. F) Treating RN: Cornell Barman Primary Care Provider: Derinda Late Other Clinician: Referring Provider: Melynda Ripple Treating Provider/Extender: Skipper Cliche in Treatment: 0 Active Problems ICD-10 Encounter Code Description Active Date MDM Diagnosis S81.812A Laceration without foreign body, left lower leg, initial encounter 01/16/2022 No Yes L97.822 Non-pressure chronic ulcer of other part of left lower leg with fat layer 01/16/2022 No Yes exposed J84.170 Interstitial lung disease with progressive fibrotic phenotype in diseases 01/16/2022 No Yes classified elsewhere I10 Essential (primary) hypertension 01/16/2022 No Yes N18.30 Chronic kidney disease, stage 3 unspecified 01/16/2022 No Yes I48.0 Paroxysmal atrial fibrillation 01/16/2022 No Yes Z79.01 Long term (current) use of anticoagulants 01/16/2022 No  Yes Z99.81 Dependence on supplemental oxygen 01/16/2022 No Yes Inactive Problems Resolved Problems Electronic Signature(s) Signed: 01/16/2022 1:30:49 PM By: Worthy Keeler PA-C Entered By: Worthy Keeler on 01/16/2022 13:30:49 [name] ([id number]) -------------------------------------------------------------------------------- Progress Note Details Patient Name: [name] Date of Service: 01/16/2022 12:45 PM Medical Record Number: [id number] Patient Account Number: [id number] Date of Birth/Sex: December 30, 1940 (80 y.o. F) Treating RN: Cornell Barman Primary Care Provider: Derinda Late Other Clinician: Referring Provider: Melynda Ripple Treating Provider/Extender: Skipper Cliche in Treatment: 0 Subjective Chief Complaint Information obtained from Patient Left LE Ulcer History of Present Illness (HPI) 01-16-2022 upon evaluation patient appears for initial evaluation here in the clinic today concerning issues that she has been having with a wound over the anterior portion of her left lower extremity. This is a laceration that occurred on 12-11-2021. She states that she hit this on a car door. Subsequently she sustained a skin tear which has been difficult to heal since that point. No sutures have been placed. She has been using Neosporin and try to leave it open to air which was the recommendation from the physician that she saw. With that being said she tells me that its been difficult to get this to heal and she subsequently ended up with an appointment here in order for [place] to see if there is anything we can recommend to speed up the healing process. She does have long-term use of prednisone due to interstitial lung disease as well as taking fluid pills for lower extremity swelling. Her swelling is not too bad today and to be honest I think it may be better for her to be able to change the dressing more frequently than have a compression wrap on currently. We may need to wrap her  though if we do not see the improvement that we really want to see going forward we will keep a close eye on things here. Patient has a history of interstitial lung disease, hypertension, chronic kidney disease stage III, atrial fibrillation, long-term use of anticoagulant therapy, and dependence on supplemental oxygen. She is also on prednisone long-term for the lung disease. Patient History Information obtained from Chart. Allergies Prevacid (Reaction: heart racing), magnesium carbonate, magnesium oxide, mag sulfate Social History Never smoker. Medical History Eyes Patient has history of Glaucoma Cardiovascular Patient has history of Arrhythmia - a-fib, Hypertension Genitourinary Patient has history of End Stage Renal Disease Medical And Surgical History Notes Respiratory interstitial Lung Disease Genitourinary CKD 3 Integumentary (Skin) Squamous cell Objective Constitutional patient is  hypertensive.. pulse regular and within target range for patient.Marland Kitchen respirations regular, non-labored and within target range for patient.Marland Kitchen temperature within target range for patient.. Well-nourished and well-hydrated in no acute distress. Vitals Time Taken: 1:01 PM, Height: 61 in, Source: Stated, Weight: 150.8 lbs, Source: Measured, BMI: 28.5, Temperature: 98.2 F, Pulse: 63 bpm, Respiratory Rate: 16 breaths/min, Blood Pressure: 148/73 mmHg, Pulse Oximetry: 92 %. EVOLETH, NORDMEYER P. ([id number]) Eyes conjunctiva clear no eyelid edema noted. pupils equal round and reactive to light and accommodation. Ears, Nose, Mouth, and Throat no gross abnormality of ear auricles or external auditory canals. normal hearing noted during conversation. mucus membranes moist. Respiratory normal breathing without difficulty. Cardiovascular 2+ dorsalis pedis/posterior tibialis pulses. 1+ pitting edema of the bilateral lower extremities. Musculoskeletal normal gait and posture. no significant deformity or arthritic  changes, no loss or range of motion, no clubbing. Psychiatric this patient is able to make decisions and demonstrates good insight into disease process. Alert and Oriented x 3. pleasant and cooperative. General Notes: Upon evaluation today patient's wound actually shows mainly necrotic tissue covering the surface of the wound. I discussed that with her today. I do believe that sharp debridement is good to be necessary to clearway the necrotic debris and allow this to begin healing more appropriately. She voiced understanding. She does normally wear compression socks but has not been wearing them with the current issue going on. For that reason I do believe Tubigrip would be easier for them to get off and on for dressing changes which I would like for them to do 3 times per week. Integumentary (Hair, Skin) Wound #1 status is Open. Original cause of wound was Trauma. The date acquired was: 12/11/2021. The wound is located on the Left,Midline Lower Leg. The wound measures 1.5cm length x 1.8cm width x 0.1cm depth; 2.121cm^2 area and 0.212cm^3 volume. There is Fat Layer (Subcutaneous Tissue) exposed. There is no tunneling or undermining noted. There is a medium amount of serous drainage noted. The wound margin is distinct with the outline attached to the wound base. There is no granulation within the wound bed. There is a large (67-100%) amount of necrotic tissue within the wound bed including Adherent Slough. 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 There was a Excisional Skin/Subcutaneous Tissue Debridement with a total area of 2.7 sq cm performed by Tommie Sams., PA-C. With the following instrument(s): Curette to remove Viable and Non-Viable tissue/material. Material removed includes Subcutaneous Tissue and Skin: Dermis and. No specimens were taken. A time out was conducted at 13:37, prior to the start of the procedure. A Minimum amount of bleeding was controlled with Pressure. The procedure was tolerated well. Post Debridement Measurements: 1.4cm length x 1.8cm width x 0.2cm depth; 0.396cm^3 volume. Character of Wound/Ulcer Post Debridement is stable. Post procedure Diagnosis Wound #1: Same as Pre-Procedure Plan Follow-up Appointments: HANSIKA, LEAMING ([id number]) Return Appointment in 1 week. Bathing/ Shower/ Hygiene: May shower; gently cleanse wound with antibacterial soap, rinse and pat dry prior to dressing wounds WOUND #1: - Lower Leg Wound Laterality: Left, Midline Primary Dressing: Prisma  4.34 (in) 3 x Per Week/30 Days Discharge Instructions: Moisten w/normal saline or sterile water; Cover wound as directed. Do not remove from wound bed. Secondary Dressing: ABD Pad 5x9 (in/in) 3 x Per Week/30 Days Discharge Instructions: Cover with ABD pad Secured With: Conform 4'' - Conforming Stretch Gauze Bandage 4x75 (in/in) 3 x Per Week/30 Days Discharge Instructions: Apply as directed 1. I am going to recommend currently that we go ahead and initiate treatment with the silver collagen dressing which I think would be perfect for her currently. 2. I am also can recommend that we have the patient continue with the ABD pad to cover followed by roll gauze to secure in place. 3. I would also suggest the patient continue to use the Tubigrip which I do believe is going to do quite well for her. We will see patient back for reevaluation in 1 week here in the  clinic. If anything worsens or changes patient will contact our office for additional recommendations. Electronic Signature(s) Signed: 01/16/2022 2:02:48 PM By: Worthy Keeler PA-C Entered By: Worthy Keeler on 01/16/2022 14:02:47 [name] ([id number]) -------------------------------------------------------------------------------- ROS/PFSH Details Patient Name: [name] Date of Service: 01/16/2022 12:45 PM Medical Record Number: [id number] Patient Account Number: [id number] Date of Birth/Sex: [birth year] (80 y.o. F) Treating RN: Cornell Barman Primary Care Provider: Derinda Late Other Clinician: Referring Provider: Melynda Ripple Treating Provider/Extender: Skipper Cliche in Treatment: 0 Information Obtained From Chart Eyes Medical History: Positive for: Glaucoma Respiratory Medical History: Past Medical History Notes: interstitial Lung Disease Cardiovascular Medical History: Positive for: Arrhythmia - a-fib; Hypertension Genitourinary Medical History: Positive for: End Stage Renal Disease Past Medical History Notes: CKD 3 Integumentary (Skin) Medical History: Past Medical History Notes: Squamous cell HBO Extended History Items Eyes: Glaucoma Immunizations Pneumococcal Vaccine: Received Pneumococcal Vaccination: Yes Received Pneumococcal Vaccination On or After 60th Birthday: Yes Implantable Devices None Family and Social History 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 in diseases classified elsewhere I10 Essential (primary) hypertension N18.30 Chronic kidney disease, stage 3 unspecified I48.0 Paroxysmal atrial fibrillation Z79.01 Long term (current) use of anticoagulants Z99.81 Dependence on supplemental oxygen Facility Procedures CPT4 Code: [id number] Description: 99213 - WOUND CARE VISIT-LEV 3 EST PT Modifier: Quantity: 1 CPT4 Code: [id number] Description: 11042 - DEB SUBQ TISSUE 20 SQ CM/< Modifier: Quantity: 1 CPT4 Code: Description: ICD-10 Diagnosis Description L97.822 Non-pressure chronic ulcer of other part of left lower leg with fat layer S81.812A Laceration without foreign body, left lower leg, initial encounter Modifier: exposed Quantity: Physician Procedures CPT4 Code: 7893810 Description: WC PHYS LEVEL 3 o NEW PT Modifier: 25 Quantity: 1 CPT4 Code: Description: ICD-10 Diagnosis Description S81.812A Laceration without foreign body, left lower leg, initial encounter L97.822 Non-pressure chronic ulcer of other part of left lower leg with fat layer e  J84.170 Interstitial lung disease with progressive  fibrotic phenotype in diseases c I10 Essential (primary) hypertension Modifier: xposed lassified elsewhere Quantity: CPT4 Code: [id number] Description: 11042 - WC PHYS SUBQ TISS 20 SQ CM Modifier: Quantity: 1 CPT4 Code: Description: ICD-10 Diagnosis Description L97.822 Non-pressure chronic ulcer of other part of left lower leg with fat layer e F41.443Q Laceration without foreign body, left  lower leg, initial encounter Modifier: xposed Quantity: Electronic Signature(s) Signed: 01/16/2022 2:03:05 PM By: Worthy Keeler PA-C Entered By: Worthy Keeler on 01/16/2022 14:03:05

## 2022-01-22 ENCOUNTER — Ambulatory Visit: Payer: Medicare PPO

## 2022-01-22 ENCOUNTER — Ambulatory Visit: Payer: Medicare PPO | Admitting: Dermatology

## 2022-01-22 DIAGNOSIS — L578 Other skin changes due to chronic exposure to nonionizing radiation: Secondary | ICD-10-CM

## 2022-01-22 DIAGNOSIS — M5459 Other low back pain: Secondary | ICD-10-CM | POA: Diagnosis not present

## 2022-01-22 DIAGNOSIS — L570 Actinic keratosis: Secondary | ICD-10-CM

## 2022-01-22 DIAGNOSIS — L820 Inflamed seborrheic keratosis: Secondary | ICD-10-CM | POA: Diagnosis not present

## 2022-01-22 DIAGNOSIS — R262 Difficulty in walking, not elsewhere classified: Secondary | ICD-10-CM

## 2022-01-22 DIAGNOSIS — M6281 Muscle weakness (generalized): Secondary | ICD-10-CM

## 2022-01-22 DIAGNOSIS — M5431 Sciatica, right side: Secondary | ICD-10-CM

## 2022-01-22 DIAGNOSIS — Z9181 History of falling: Secondary | ICD-10-CM

## 2022-01-22 DIAGNOSIS — M5417 Radiculopathy, lumbosacral region: Secondary | ICD-10-CM

## 2022-01-22 MED ORDER — FLUOROURACIL 5 % EX CREA: TOPICAL_CREAM | Freq: Two times a day (BID) | CUTANEOUS | 0 refills | Status: DC

## 2022-01-22 NOTE — Therapy (Signed)
[facility] [address] [place], [address] Phone: [phone]   Fax:  [phone]  Physical Therapy Treatment  Patient Details  Name: Joyce Evans MRN: [id number] Date of Birth: October 18, 1940 Referring Provider (PT): Leonie Green, NP   Encounter Date: 01/22/2022   PT End of Session - 01/22/22 0902     Visit Number 3    Number of Visits 17    Date for PT Re-Evaluation 03/12/22    Authorization Type [employer]    Authorization Time Period cert through March 12, 2022    PT Start Time 0845    PT Stop Time 0925    PT Time Calculation (min) 40 min    Activity Tolerance Patient tolerated treatment well;No increased pain;Patient limited by fatigue    Behavior During Therapy [facility] for tasks assessed/performed             Past Medical History:  Diagnosis Date   Arthritis    osteo - shoulders, fingers   Atrial fibrillation ([place])    Dizziness    thinks because of diuretic   Family history of adverse reaction to anesthesia    daughter -PONV   Gallstones 2016, 2017   GERD (gastroesophageal reflux disease)    Glaucoma    Heart murmur    history of   Hypertension    Melanoma ([place]) 04/20/2016   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 Excised: 05/19/2016, margins free.   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   Tricuspid valve disorder    leak    Past Surgical History:  Procedure Laterality Date   ABDOMINAL HYSTERECTOMY  1987   partial   CARDIAC CATHETERIZATION  2010   [facility]   CATARACT EXTRACTION W/PHACO Right 08/28/2015   Procedure: CATARACT EXTRACTION PHACO AND INTRAOCULAR LENS PLACEMENT ([place]);  Surgeon: Leandrew Koyanagi, MD;  Location: [facility];  Service: Ophthalmology;  Laterality: Right;  TORIC   INTRAMEDULLARY (IM) NAIL  INTERTROCHANTERIC Right 10/16/2021   Procedure: INTRAMEDULLARY (IM) NAIL INTERTROCHANTRIC;  Surgeon: Renee Harder, MD;  Location: [facility];  Service: Orthopedics;  Laterality: Right;   MELANOMA EXCISION Left 05/19/2016   MIS. Left mid. lat. tricep. Margins free   MOHS SURGERY  2013   TONSILLECTOMY  1971    There were no vitals filed for this visit.   Subjective Assessment - 01/22/22 0848     Subjective Pt doing good today. Tried out HEP with success, some mild soreness. She is stiff and sore this morning, but no pain. She brings her O2 concentrator for plug-in during increased flow rate.    Pertinent History Pt here for low back pain. Pt is also s/p IM nail fixation for right intertrochanteric hip fracture with Dr. Sharlet Salina on 10/16/2021. Pt slipped on the kitchen floor. Pt was getting something from the microwave and pt tripped on her O2 tube. Pt was independent with ambulation prior to her fall. Pt fell before in the woods and pt tripped over a root 3 years ago (no injuries per pt). Pt has been using supplemental O2 for the past 2 years in November. Pt has interstitial lung disease (idiopathic pulmonary fibrosis). Recent breathing test is stable. Back pain began when pt was in Rehab in [place] [facility]. Feels  like it is sciatical. Pt radiates along the sciatic nerve and R L 5 dermatome. Pt is healing fine as far as her R hip surgery is concerned. Also has R medial knee pain due to arthritis. Had home health PT which involves seated knee extension, hip flexion, standing hip abduction, extension. Pt did some walking wiht a rw. Feels a lot of pain all over.  Walking too far is limited due to her O2 situation.    Patient Stated Goals Be in the kitchen without her back and R LE bothering her.  Be able to get back to independent walking so she can hike and go to the grocery store.    Currently in Pain? No/denies              INTERVENTION 01/22/22: -AMB OVERGROUND,  RW, 4L/min O2, 170f, 2 minutes 3  seconds, no LOB  -Seated L trunk SB 2x10 seconds -Seated trunk flexion to the L 5x10 seconds  -Transversus abdominis contraction 10x3 with 5 second holds  -seated GTB row 1x12 (latex free band)  -Glute max contraction 10x5 seconds for 3 sets  -AMB OVERGROUND,  RW, 4L/min O2, 1244f no LOB -Transversus abdominis contraction 10x3 with 5 second holds  -seated GTB row 1x12 (latex free band) (cues for scapular retraction at end range)      PT Education - 01/22/22 0903     Education Details brief desaturation 85-89% ok with exercise/activity so long as sats recover within 1-2 minutes.    Person(s) Educated Patient    Methods Explanation;Demonstration    Comprehension Verbalized understanding;Need further instruction              PT Short Term Goals - 01/12/22 0912       PT SHORT TERM GOAL #1   Title Pt will be independent with her initial HEP to decrease pain, improve strength, function, and ability to stand and ambulate more comfortably.    Time 3    Period Weeks    Status New    Target Date 02/05/22               PT Long Term Goals - 01/12/22 0913       PT LONG TERM GOAL #1   Title Pt will have a decrease in low back pain to 3/10 or less at worst to promote ability to stand and ambulate with less difficulty.    Baseline 6/10 low back pain at worst for the past 2 months (01/12/2022)    Time 8    Period Weeks    Status New    Target Date 03/12/22      PT LONG TERM GOAL #2   Title Pt will have a decrease in R LE pain to 3/10 or less at worst to promote ability to stand and ambulate will less difficulty.    Baseline 9/10 R LE pain at worst (01/12/2022)    Time 8    Period Weeks    Status New    Target Date 03/12/22      PT LONG TERM GOAL #3   Title Pt will improve her FOTO score by at least 10 points as a demontsration of improved function.    Baseline Lumbar Spine FOTO 39 (01/12/2022)    Time 8    Period Weeks    Status New    Target Date 03/12/22      PT  LONG TERM GOAL #4   Title Pt will be able to ambulate at least 100  ft independently to promote mobility and return to prior level of function.    Baseline Able to ambulate with rw short distance, about 40 ft with reproduction of pain. (01/12/2022)    Time 8    Period Weeks    Status New    Target Date 03/12/22                   Plan - 01/22/22 0903     Clinical Impression Statement Continued with gait training, lumbar spine mobility, and BLE strength. Pt bumped O2 from 3L to 4L/min for activity, sats >88% throughout. No pain exacerbation. Pt folows cues well, remain motivated to return to a high level of function to return to her previously active lifestyle.    Personal Factors and Comorbidities Comorbidity 3+;Age;Time since onset of injury/illness/exacerbation;Fitness    Comorbidities Arthritis, atrial fibrillation, HTN, idiopathic pulmonary fibrosis    Examination-Activity Limitations Stairs;Bed Mobility;Stand;Sit;Lift;Transfers;Locomotion Level;Carry    Stability/Clinical Decision Making Stable/Uncomplicated    Clinical Decision Making Low    Rehab Potential Fair    PT Frequency 2x / week    PT Duration 8 weeks    PT Treatment/Interventions Therapeutic activities;Therapeutic exercise;Gait training;Balance training;Neuromuscular re-education;Patient/family education;Manual techniques;Dry needling;Electrical Stimulation;Iontophoresis '4mg'$ /ml Dexamethasone    PT Next Visit Plan Posture, thoracic extension, trunk and glute strength, lumbopelvic control during gait, manual techniques, modalities PRN    Consulted and Agree with Plan of Care Patient             Patient will benefit from skilled therapeutic intervention in order to improve the following deficits and impairments:     Visit Diagnosis: Other low back pain  Sciatica, right side  Radiculopathy, lumbosacral region  Difficulty in walking, not elsewhere classified  Muscle weakness (generalized)  History of  falling     Problem List Patient Active Problem List   Diagnosis Date Noted   Closed right hip fracture ([place]) 10/16/2021   TIA (transient ischemic attack) 06/17/2020   Gallstones    9:26 AM, 01/22/22 Etta Grandchild, PT, DPT Physical Therapist - [place] [phone] (Office)   [place] C, PT 01/22/2022, 9:09 AM  [facility] [address] [place], [address] Phone: [phone]   Fax:  [phone]  Name: Joyce Evans MRN: [id number] Date of Birth: [birth year]

## 2022-01-22 NOTE — Progress Notes (Signed)
Follow-Up Visit   Subjective  Joyce Evans is a 81 y.o. female who presents for the following: check spot (Nose, pt said it has been txted in past, we txted AK on 10/02/21/R preauricular, ~69mL hand, scaly spot, may have txted in past, ISK txted on L hand 10/02/21). The patient has spots, moles and lesions to be evaluated, some may be new or changing and the patient has concerns that these could be cancer.  The following portions of the chart were reviewed this encounter and updated as appropriate:   Tobacco  Allergies  Meds  Problems  Med Hx  Surg Hx  Fam Hx     Review of Systems:  No other skin or systemic complaints except as noted in HPI or Assessment and Plan.  Objective  Well appearing patient in no apparent distress; mood and affect are within normal limits.  A focused examination was performed including face, neck, chest and back and face, left hand. Relevant physical exam findings are noted in the Assessment and Plan.  Nose Pink scaly macules  R preauricular x 1, L dorsum hand x 1, R dorsum hand x 1 (3) Stuck on waxy paps with erythema   Assessment & Plan   Actinic Damage - Severe, confluent actinic changes with pre-cancerous actinic keratoses  - Severe, chronic, not at goal, secondary to cumulative UV radiation exposure over time - diffuse scaly erythematous macules and papules with underlying dyspigmentation - Discussed Prescription "Field Treatment" for Severe, Chronic Confluent Actinic Changes with Pre-Cancerous Actinic Keratoses Field treatment involves treatment of an entire area of skin that has confluent Actinic Changes (Sun/ Ultraviolet light damage) and PreCancerous Actinic Keratoses by method of PhotoDynamic Therapy (PDT) and/or prescription Topical Chemotherapy agents such as 5-fluorouracil, 5-fluorouracil/calcipotriene, and/or imiquimod.  The purpose is to decrease the number of clinically evident and subclinical PreCancerous lesions to prevent  progression to development of skin cancer by chemically destroying early precancer changes that may or may not be visible.  It has been shown to reduce the risk of developing skin cancer in the treated area. As a result of treatment, redness, scaling, crusting, and open sores may occur during treatment course. One or more than one of these methods may be used and may have to be used several times to control, suppress and eliminate the PreCancerous changes. Discussed treatment course, expected reaction, and possible side effects. - Recommend daily broad spectrum sunscreen SPF 30+ to sun-exposed areas, reapply every 2 hours as needed.  - Staying in the shade or wearing long sleeves, sun glasses (UVA+UVB protection) and wide brim hats (4-inch brim around the entire circumference of the hat) are also recommended. - Call for new or changing lesions.  -On August 15 Start 5-fluorouracil/calcipotriene cream twice a day for 7 days to affected areas including nose. [facility] provided with contact information for pharmacy and advised the pharmacy will mail the prescription to their home. Patient provided with handout reviewing treatment course and side effects and advised to call or message [place] MyChart with any concerns.    AK (actinic keratosis) Nose -On August 15 Start 5-fluorouracil/calcipotriene cream twice a day for 7 days to affected areas including nose. [facility] - [facility]ose Complexity: simple   Destruction method: cryotherapy   Informed consent: discussed and consent obtained   Timeout:  patient name, date of birth, surgical site, and procedure verified Lesion destroyed using liquid nitrogen: Yes   Region frozen  until ice ball extended beyond lesion: Yes   Outcome: patient tolerated procedure well with no complications   Post-procedure details: wound care instructions given    fluorouracil (EFUDEX) 5 % cream -  Nose Apply topically 2 (two) times daily. On August 15 start cream to nose bid for 7 days  Inflamed seborrheic keratosis (3) R preauricular x 1, L dorsum hand x 1, R dorsum hand x 1 Symptomatic, irritating, patient would like treated. Destruction of lesion - R preauricular x 1, L dorsum hand x 1, R dorsum hand x 1 Complexity: simple   Destruction method: cryotherapy   Informed consent: discussed and consent obtained   Timeout:  patient name, date of birth, surgical site, and procedure verified Lesion destroyed using liquid nitrogen: Yes   Region frozen until ice ball extended beyond lesion: Yes   Outcome: patient tolerated procedure well with no complications   Post-procedure details: wound care instructions given    Return for as scheduled for f/u.  I, Othelia Pulling, RMA, am acting as scribe for Sarina Ser, MD . Documentation: I have reviewed the above documentation for accuracy and completeness, and I agree with the above.  Sarina Ser, MD

## 2022-01-22 NOTE — Patient Instructions (Addendum)
On August 15 Start 5-fluorouracil/calcipotriene cream twice a day for 7 days to affected areas including nose. Prescription sent to [facility]. [phone]  5-Fluorouracil/Calcipotriene Patient Education   Actinic keratoses are the dry, red scaly spots on the skin caused by sun damage. A portion of these spots can turn into skin cancer with time, and treating them can help prevent development of skin cancer.   Treatment of these spots requires removal of the defective skin cells. There are various ways to remove actinic keratoses, including freezing with liquid nitrogen, treatment with creams, or treatment with a blue light procedure in the office.   5-fluorouracil cream is a topical cream used to treat actinic keratoses. It works by interfering with the growth of abnormal fast-growing skin cells, such as actinic keratoses. These cells peel off and are replaced by healthy ones.   5-fluorouracil/calcipotriene is a combination of the 5-fluorouracil cream with a vitamin D analog cream called calcipotriene. The calcipotriene alone does not treat actinic keratoses. However, when it is combined with 5-fluorouracil, it helps the 5-fluorouracil treat the actinic keratoses much faster so that the same results can be achieved with a much shorter treatment time.  INSTRUCTIONS FOR 5-FLUOROURACIL/CALCIPOTRIENE CREAM:   5-fluorouracil/calcipotriene cream typically only needs to be used for 4-7 days. A thin layer should be applied twice a day to the treatment areas recommended by your physician.   If your physician prescribed you separate tubes of 5-fluourouracil and calcipotriene, apply a thin layer of 5-fluorouracil followed by a thin layer of calcipotriene.   Avoid contact with your eyes, nostrils, and mouth. Do not use 5-fluorouracil/calcipotriene cream on infected or open wounds.   You will develop redness, irritation and some crusting at areas where you have pre-cancer damage/actinic keratoses.  IF YOU DEVELOP PAIN, BLEEDING, OR SIGNIFICANT CRUSTING, STOP THE TREATMENT EARLY - you have already gotten a good response and the actinic keratoses should clear up well.  Wash your hands after applying 5-fluorouracil 5% cream on your skin.   A moisturizer or sunscreen with a minimum SPF 30 should be applied each morning.   Once you have finished the treatment, you can apply a thin layer of Vaseline twice a day to irritated areas to soothe and calm the areas more quickly. If you experience significant discomfort, contact your physician.  For some patients it is necessary to repeat the treatment for best results.  SIDE EFFECTS: When using 5-fluorouracil/calcipotriene cream, you may have mild irritation, such as redness, dryness, swelling, or a mild burning sensation. This usually resolves within 2 weeks. The more actinic keratoses you have, the more redness and inflammation you can expect during treatment. Eye irritation has been reported rarely. If this occurs, please let us know.   If you have any trouble using this cream, please call the office. If you have any other questions about this information, please do not hesitate to ask me before you leave the office.        Due to recent changes in healthcare laws, you may see results of your pathology and/or laboratory studies on MyChart before the doctors have had a chance to review them. We understand that in some cases there may be results that are confusing or concerning to you. Please understand that not all results are received at the same time and often the doctors may need to interpret multiple results in order to provide you with the best plan of care or course of treatment. Therefore, we ask that you please give [place] 2  business days to thoroughly review all your results before contacting the office for clarification. Should we see a critical lab result, you will be contacted sooner.   If You Need Anything After Your Visit  If you have  any questions or concerns for your doctor, please call our main line at [phone] and press option 4 to reach your doctor's medical assistant. If no one answers, please leave a voicemail as directed and we will return your call as soon as possible. Messages left after 4 pm will be answered the following business day.   You may also send [place] a message via [place]. We typically respond to MyChart messages within 1-2 business days.  For prescription refills, please ask your pharmacy to contact our office. Our fax number is [phone].  If you have an urgent issue when the clinic is closed that cannot wait until the next business day, you can page your doctor at the number below.    Please note that while we do our best to be available for urgent issues outside of office hours, we are not available 24/7.   If you have an urgent issue and are unable to reach [place], you may choose to seek medical care at your doctor's office, retail clinic, urgent care center, or emergency room.  If you have a medical emergency, please immediately call 911 or go to the emergency department.  Pager Numbers  - Dr. Nehemiah Massed: [phone]  - Dr. Laurence Ferrari: [phone]  - Dr. Nicole Kindred: [phone]  In the event of inclement weather, please call our main line at [phone] for an update on the status of any delays or closures.  Dermatology Medication Tips: Please keep the boxes that topical medications come in in order to help keep track of the instructions about where and how to use these. Pharmacies typically print the medication instructions only on the boxes and not directly on the medication tubes.   If your medication is too expensive, please contact our office at [phone] option 4 or send [place] a message through [place].   We are unable to tell what your co-pay for medications will be in advance as this is different depending on your insurance coverage. However, we may be able to find a substitute medication  at lower cost or fill out paperwork to get insurance to cover a needed medication.   If a prior authorization is required to get your medication covered by your insurance company, please allow [place] 1-2 business days to complete this process.  Drug prices often vary depending on where the prescription is filled and some pharmacies may offer cheaper prices.  The website www.goodrx.com contains coupons for medications through different pharmacies. The prices here do not account for what the cost may be with help from insurance (it may be cheaper with your insurance), but the website can give you the price if you did not use any insurance.  - You can print the associated coupon and take it with your prescription to the pharmacy.  - You may also stop by our office during regular business hours and pick up a GoodRx coupon card.  - If you need your prescription sent electronically to a different pharmacy, notify our office through [facility] or by phone at [phone] option 4.     Si Usted Necesita Algo Despus de Su Visita  Tambin puede enviarnos un mensaje a travs de Pharmacist, community. Por lo general respondemos a los mensajes de MyChart en el transcurso de 1 a 2 das  hbiles.  Para renovar recetas, por favor pida a su farmacia que se ponga en contacto con nuestra oficina. Harland Dingwall de fax es [place] [phone].  Si tiene un asunto urgente cuando la clnica est cerrada y que no puede esperar hasta el siguiente da hbil, puede llamar/localizar a su doctor(a) al nmero que aparece a continuacin.   Por favor, tenga en cuenta que aunque hacemos todo lo posible para estar disponibles para asuntos urgentes fuera del horario de [place], no estamos disponibles las 24 horas del da, los 7 das de la [place].   Si tiene un problema urgente y no puede comunicarse con nosotros, puede optar por buscar atencin mdica  en el consultorio de su doctor(a), en una clnica privada, en un centro de atencin  urgente o en una sala de emergencias.  Si tiene Engineering geologist, por favor llame inmediatamente al 911 o vaya a la sala de emergencias.  Nmeros de bper  - Dr. Nehemiah Massed: [phone]  - Dra. Moye: [phone]  - Dra. Nicole Kindred: [phone]  En caso de inclemencias del [place], por favor llame a Johnsie Kindred principal al [phone] para una actualizacin sobre el [place] de cualquier retraso o cierre.  Consejos para la medicacin en dermatologa: Por favor, guarde las cajas en las que vienen los medicamentos de uso tpico para ayudarle a seguir las instrucciones sobre dnde y cmo usarlos. Las farmacias generalmente imprimen las instrucciones del medicamento slo en las cajas y no directamente en los tubos del [place].   Si su medicamento es muy caro, por favor, pngase en contacto con Zigmund Daniel llamando al [phone] y presione la opcin 4 o envenos un mensaje a travs de Pharmacist, community.   No podemos decirle cul ser su copago por los medicamentos por adelantado ya que esto es diferente dependiendo de la cobertura de su seguro. Sin embargo, es posible que podamos encontrar un medicamento sustituto a Electrical engineer un formulario para que el seguro cubra el medicamento que se considera necesario.   Si se requiere una autorizacin previa para que su compaa de seguros [place] su medicamento, por favor permtanos de 1 a 2 das hbiles para completar este proceso.  Los precios de los medicamentos varan con frecuencia dependiendo del Environmental consultant de dnde se surte la receta y alguna farmacias pueden ofrecer precios ms baratos.  El sitio web www.goodrx.com tiene cupones para medicamentos de Airline pilot. Los precios aqu no tienen en cuenta lo que podra costar con la ayuda del seguro (puede ser ms barato con su seguro), pero el sitio web puede darle el precio si no utiliz Research scientist (physical sciences).  - Puede imprimir el cupn correspondiente y llevarlo con su receta a la farmacia.  -  Tambin puede pasar por nuestra oficina durante el horario de atencin regular y Charity fundraiser una tarjeta de cupones de GoodRx.  - Si necesita que su receta se enve electrnicamente a una farmacia diferente, informe a nuestra oficina a travs de MyChart de [place] o por telfono llamando al [phone] y presione la opcin 4.

## 2022-01-26 ENCOUNTER — Encounter: Payer: Medicare PPO | Admitting: Physician Assistant

## 2022-01-26 ENCOUNTER — Ambulatory Visit: Payer: Medicare PPO

## 2022-01-26 DIAGNOSIS — M5459 Other low back pain: Secondary | ICD-10-CM

## 2022-01-26 DIAGNOSIS — R262 Difficulty in walking, not elsewhere classified: Secondary | ICD-10-CM

## 2022-01-26 DIAGNOSIS — Z9181 History of falling: Secondary | ICD-10-CM

## 2022-01-26 DIAGNOSIS — S81812A Laceration without foreign body, left lower leg, initial encounter: Secondary | ICD-10-CM | POA: Diagnosis not present

## 2022-01-26 DIAGNOSIS — M5431 Sciatica, right side: Secondary | ICD-10-CM

## 2022-01-26 DIAGNOSIS — M5417 Radiculopathy, lumbosacral region: Secondary | ICD-10-CM

## 2022-01-26 DIAGNOSIS — M6281 Muscle weakness (generalized): Secondary | ICD-10-CM

## 2022-01-26 NOTE — Therapy (Signed)
[facility] [address] [place], [address] Phone: [phone]   Fax:  [phone]  Physical Therapy Treatment  Patient Details  Name: Joyce Evans MRN: [id number] Date of Birth: [birth year] Referring Provider (PT): Leonie Green, NP   Encounter Date: 01/26/2022   PT End of Session - 01/26/22 1147     Visit Number 4    Number of Visits 17    Date for PT Re-Evaluation 03/12/22    Authorization Type [employer]    Authorization Time Period cert through March 12, 2022    Progress Note Due on Visit 10    PT Start Time 1140    PT Stop Time 1220    PT Time Calculation (min) 40 min    Activity Tolerance Patient tolerated treatment well;No increased pain;Patient limited by fatigue    Behavior During Therapy [facility] for tasks assessed/performed             Past Medical History:  Diagnosis Date   Arthritis    osteo - shoulders, fingers   Atrial fibrillation ([place])    Dizziness    thinks because of diuretic   Family history of adverse reaction to anesthesia    daughter -PONV   Gallstones 2016, 2017   GERD (gastroesophageal reflux disease)    Glaucoma    Heart murmur    history of   Hypertension    Melanoma ([place]) 04/20/2016   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 Excised: 05/19/2016, margins free.   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   Tricuspid valve disorder    leak    Past Surgical History:  Procedure Laterality Date   ABDOMINAL HYSTERECTOMY  1987   partial   CARDIAC CATHETERIZATION  2010   [facility]   CATARACT EXTRACTION W/PHACO Right 08/28/2015   Procedure: CATARACT EXTRACTION PHACO AND INTRAOCULAR LENS PLACEMENT ([place]);  Surgeon: Leandrew Koyanagi, MD;  Location: [place] [facility];  Service: Ophthalmology;  Laterality: Right;  TORIC    INTRAMEDULLARY (IM) NAIL INTERTROCHANTERIC Right 10/16/2021   Procedure: INTRAMEDULLARY (IM) NAIL INTERTROCHANTRIC;  Surgeon: Renee Harder, MD;  Location: [facility];  Service: Orthopedics;  Laterality: Right;   MELANOMA EXCISION Left 05/19/2016   MIS. Left mid. lat. tricep. Margins free   MOHS SURGERY  2013   TONSILLECTOMY  1971    There were no vitals filed for this visit.   Subjective Assessment - 01/26/22 1141     Subjective Pt doing well in general, had a good weekend. Pt reports she gets achy and chill saround 7:00 in evening but it typically resolves. Pt has been working on deep breathing. Pt feels like she is getting stronger.    Pertinent History Pt here for low back pain. Pt is also s/p IM nail fixation for right intertrochanteric hip fracture with Dr. Sharlet Salina on 10/16/2021. Pt slipped on the kitchen floor. Pt was getting something from the microwave and pt tripped on her O2 tube. Pt was independent with ambulation prior to her fall. Pt fell before in the woods and pt tripped over a root 3 years ago (no injuries per pt). Pt has been using supplemental O2 for the past 2 years in November. Pt has interstitial lung disease (idiopathic pulmonary fibrosis). Recent breathing test  is stable. Back pain began when pt was in Rehab in [place]. Feels like it is sciatical. Pt radiates along the sciatic nerve and R L 5 dermatome. Pt is healing fine as far as her R hip surgery is concerned. Also has R medial knee pain due to arthritis. Had home health PT which involves seated knee extension, hip flexion, standing hip abduction, extension. Pt did some walking wiht a rw. Feels a lot of pain all over.  Walking too far is limited due to her O2 situation.    Currently in Pain? No/denies              INTERVENTION THIS DATE: -Seated L trunk SB 2x10 seconds -Seated trunk flexion to the L 5x10 seconds  -seated TrABD contraction 10x5secH   -overground AMB 263f c 4ww, 4L/min O2, 4 minutes, 84%  recovering to 92% over 90sec  -SLS 10x5sec bilat, alternating sides, ad lib support on 4WW   -STS from WC, hands on knees 1x8   -overground AMB 2233fc 4ww, 4L/min O2, 4 minutes, 84% recovering to 92% over 90sec  -STS from WC, hands on knees 1x8       PT Education - 01/26/22 1148     Education Details form for exercises overground    Person(s) Educated Patient    Methods Explanation;Demonstration    Comprehension Verbalized understanding;Returned demonstration              PT Short Term Goals - 01/12/22 0912       PT SHORT TERM GOAL #1   Title Pt will be independent with her initial HEP to decrease pain, improve strength, function, and ability to stand and ambulate more comfortably.    Time 3    Period Weeks    Status New    Target Date 02/05/22               PT Long Term Goals - 01/12/22 0913       PT LONG TERM GOAL #1   Title Pt will have a decrease in low back pain to 3/10 or less at worst to promote ability to stand and ambulate with less difficulty.    Baseline 6/10 low back pain at worst for the past 2 months (01/12/2022)    Time 8    Period Weeks    Status New    Target Date 03/12/22      PT LONG TERM GOAL #2   Title Pt will have a decrease in R LE pain to 3/10 or less at worst to promote ability to stand and ambulate will less difficulty.    Baseline 9/10 R LE pain at worst (01/12/2022)    Time 8    Period Weeks    Status New    Target Date 03/12/22      PT LONG TERM GOAL #3   Title Pt will improve her FOTO score by at least 10 points as a demontsration of improved function.    Baseline Lumbar Spine FOTO 39 (01/12/2022)    Time 8    Period Weeks    Status New    Target Date 03/12/22      PT LONG TERM GOAL #4   Title Pt will be able to ambulate at least 100 ft independently to promote mobility and return to prior level of function.    Baseline Able to ambulate with rw short distance, about 40 ft with reproduction of pain. (01/12/2022)    Time 8  Period Weeks    Status New    Target Date 03/12/22                   Plan - 01/26/22 1149     Clinical Impression Statement Continued with gait training, lumbar spine mobility, and BLE strength. Pt bumped O2 from 3L to 4L/min for activity, AMB progressed to ~ 210f which takes 4 minutes of walking, but her sats drop briefly to 85% without significant dyspnea. No pain exacerbation. Pt follows cues well, remain motivated to return to a high level of function to return to her previously active lifestyle.    Personal Factors and Comorbidities Comorbidity 3+;Age;Time since onset of injury/illness/exacerbation;Fitness    Comorbidities Arthritis, atrial fibrillation, HTN, idiopathic pulmonary fibrosis    Examination-Activity Limitations Stairs;Bed Mobility;Stand;Sit;Lift;Transfers;Locomotion Level;Carry    Stability/Clinical Decision Making Stable/Uncomplicated    Clinical Decision Making Low    Rehab Potential Fair    PT Frequency 2x / week    PT Duration 8 weeks    PT Treatment/Interventions Therapeutic activities;Therapeutic exercise;Gait training;Balance training;Neuromuscular re-education;Patient/family education;Manual techniques;Dry needling;Electrical Stimulation;Iontophoresis '4mg'$ /ml Dexamethasone    PT Next Visit Plan Posture, thoracic extension, trunk and glute strength, lumbopelvic control during gait, manual techniques, modalities PRN    Consulted and Agree with Plan of Care Patient             Patient will benefit from skilled therapeutic intervention in order to improve the following deficits and impairments:     Visit Diagnosis: Other low back pain  Sciatica, right side  Radiculopathy, lumbosacral region  Difficulty in walking, not elsewhere classified  Muscle weakness (generalized)  History of falling     Problem List Patient Active Problem List   Diagnosis Date Noted   [place]16[place]   TIA (transient ischemic attack)  06/17/2020   Gallstones    12:24 PM, 01/26/22 AEtta Grandchild PT, DPT Physical Therapist - [phone](Office)   BFort KlamathC, PT 01/26/2022, 11:50 AM  [facility] [address] [place] [address]Phone: [phone]  Fax:  [phone] Name: JJENNICA TAGLIAFERRIMRN: [id number] of Birth: 109/08/1940

## 2022-01-26 NOTE — Progress Notes (Signed)
LOUGENIA, MORRISSEY ([id number]) Visit Report for 01/26/2022 Chief Complaint Document Details Patient Name: Joyce Evans, Joyce Evans Date of Service: 01/26/2022 3:00 PM Medical Record Number: [id number] Patient Account Number: [id number] Date of Birth/Sex: Nov 29, 1940 (81 y.o. F) Treating RN: Levora Dredge Primary Care Provider: Derinda Late Other Clinician: Massie Kluver Referring Provider: Derinda Late Treating Provider/Extender: Skipper Cliche in Treatment: 1 Information Obtained from: Patient Chief Complaint Left LE Ulcer Electronic Signature(s) Signed: 01/26/2022 3:20:51 PM By: Worthy Keeler PA-C Entered By: Worthy Keeler on 01/26/2022 15:20:51 Joyce Evans ([id number]) -------------------------------------------------------------------------------- Debridement Details Patient Name: Joyce Evans Date of Service: 01/26/2022 3:00 PM Medical Record Number: [id number] Patient Account Number: [id number] Date of Birth/Sex: 23-Aug-1940 (81 y.o. F) Treating RN: Levora Dredge Primary Care Provider: Derinda Late Other Clinician: Massie Kluver Referring Provider: Derinda Late Treating Provider/Extender: Skipper Cliche in Treatment: 1 Debridement Performed for Wound #1 Left,Midline Lower Leg Assessment: Performed By: Physician Tommie Sams., PA-C Debridement Type: Debridement Level of Consciousness (Pre- Awake and Alert procedure): Pre-procedure Verification/Time Out Yes - 04:03 Taken: Start Time: 04:03 Total Area Debrided (L x W): 1.3 (cm) x 1 (cm) = 1.3 (cm) Tissue and other material Viable, Non-Viable, Slough, Subcutaneous, Slough debrided: Level: Skin/Subcutaneous Tissue Debridement Description: Excisional Instrument: Curette Bleeding: Minimum Hemostasis Achieved: Pressure End Time: 04:07 Response to Treatment: Procedure was tolerated well Level of Consciousness (Post- Awake and Alert procedure): Post Debridement Measurements of Total  Wound Length: (cm) 1.3 Width: (cm) 1 Depth: (cm) 0.2 Volume: (cm) 0.204 Character of Wound/Ulcer Post Debridement: Stable Post Procedure Diagnosis Same as Pre-procedure Electronic Signature(s) Unsigned Entered By: Massie Kluver on 01/26/2022 16:09:23 Signature(s): Date(s): Joyce Evans ([id number]) -------------------------------------------------------------------------------- Physician Orders Details Patient Name: Joyce Evans Date of Service: 01/26/2022 3:00 PM Medical Record Number: [id number] Patient Account Number: [id number] Date of Birth/Sex: 1940-11-25 (81 y.o. F) Treating RN: Levora Dredge Primary Care Provider: Derinda Late Other Clinician: Massie Kluver Referring Provider: Derinda Late Treating Provider/Extender: Skipper Cliche in Treatment: 1 Verbal / Phone Orders: No Diagnosis Coding ICD-10 Coding Code Description [phone] Laceration without foreign body, left lower leg, initial encounter L97.822 Non-pressure chronic ulcer of other part of left lower leg with fat layer exposed J84.170 Interstitial lung disease with progressive fibrotic phenotype in diseases classified elsewhere I10 Essential (primary) hypertension N18.30 Chronic kidney disease, stage 3 unspecified I48.0 Paroxysmal atrial fibrillation Z79.01 Long term (current) use of anticoagulants Z99.81 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 Bathing/ Shower/ Hygiene o May shower; gently cleanse wound with antibacterial soap, rinse and pat dry prior to dressing wounds Wound Treatment Wound #1 - Lower Leg Wound Laterality: Left, Midline Primary Dressing: Prisma 4.34 (in) 3 x Per Week/30 Days Discharge Instructions: Moisten w/normal saline or sterile water; Cover wound as directed. Do not remove from wound bed. Secondary Dressing: ABD Pad 5x9 (in/in) 3 x Per Week/30 Days Discharge Instructions: Cover with ABD pad Secured With: Conform  4'' - Conforming Stretch Gauze Bandage 4x75 (in/in) 3 x Per Week/30 Days Discharge Instructions: Apply as directed Electronic Signature(s) Unsigned Entered By: Massie Kluver on 01/26/2022 16:10:07 Signature(s): Date(s): Joyce Evans ([id number]) -------------------------------------------------------------------------------- Problem List Details Patient Name: Joyce Evans, Joyce Evans Date of Service: 01/26/2022 3:00 PM Medical Record Number: [id number] Patient Account Number: [id number] Date of Birth/Sex: 1940/08/31 (81 y.o. F) Treating RN: Levora Dredge Primary Care Provider: Derinda Late Other Clinician: Massie Kluver Referring Provider: Derinda Late Treating Provider/Extender: Skipper Cliche in Treatment: 1 Active Problems ICD-10 Encounter Code  Description Active Date MDM Diagnosis S81.812A Laceration without foreign body, left lower leg, initial encounter 01/16/2022 No Yes L97.822 Non-pressure chronic ulcer of other part of left lower leg with fat layer 01/16/2022 No Yes exposed J84.170 Interstitial lung disease with progressive fibrotic phenotype in diseases 01/16/2022 No Yes classified elsewhere I10 Essential (primary) hypertension 01/16/2022 No Yes N18.30 Chronic kidney disease, stage 3 unspecified 01/16/2022 No Yes I48.0 Paroxysmal atrial fibrillation 01/16/2022 No Yes Z79.01 Long term (current) use of anticoagulants 01/16/2022 No Yes Z99.81 Dependence on supplemental oxygen 01/16/2022 No Yes Inactive Problems Resolved Problems Electronic Signature(s) Signed: 01/26/2022 3:20:47 PM By: Worthy Keeler PA-C Entered By: Worthy Keeler on 01/26/2022 15:20:47

## 2022-01-26 NOTE — Progress Notes (Signed)
TANELLE, LANZO ([id number]) Visit Report for 01/26/2022 Arrival Information Details Patient Name: SUDIKSHA, VICTOR Date of Service: 01/26/2022 3:00 PM Medical Record Number: [id number] Patient Account Number: [id number] Date of Birth/Sex: [birth year] (81 y.o. F) Treating RN: Levora Dredge Primary Care Arcadio Cope: Derinda Late Other Clinician: Massie Kluver Referring Cynthea Zachman: Derinda Late Treating Jevonte Clanton/Extender: Skipper Cliche in Treatment: 1 Visit Information History Since Last Visit All ordered tests and consults were completed: No Patient Arrived: Wheel Chair Added or deleted any medications: No Arrival Time: 15:43 Any new allergies or adverse reactions: No Transfer Assistance: EasyPivot Patient Lift Had a fall or experienced change in No Patient Requires Transmission-Based No activities of daily living that may affect Precautions: risk of falls: Patient Has Alerts: Yes Hospitalized since last visit: No Patient Alerts: Patient on Blood Pain Present Now: No Thinner Eliquis NOT DIABETIC Electronic Signature(s) Unsigned Entered ByMassie Kluver on 01/26/2022 15:43:41 Signature(s): Date(s): [name] ([id number]) -------------------------------------------------------------------------------- Clinic Level of Care Assessment Details Patient Name: JOZIE, WULF Date of Service: 01/26/2022 3:00 PM Medical Record Number: [id number] Patient Account Number: [id number] Date of Birth/Sex: 1941/02/10 (81 y.o. F) Treating RN: Levora Dredge Primary Care Emmett Arntz: Derinda Late Other Clinician: Massie Kluver Referring Yolinda Duerr: Derinda Late Treating Kyrie Bun/Extender: Skipper Cliche in Treatment: 1 Clinic Level of Care Assessment Items TOOL 1 Quantity Score '[]'$  - Use when EandM and Procedure is performed on INITIAL visit 0 ASSESSMENTS - Nursing Assessment / Reassessment '[]'$  - General Physical Exam (combine w/ comprehensive assessment (listed just  below) when performed on new 0 pt. evals) '[]'$  - 0 Comprehensive Assessment (HX, ROS, Risk Assessments, Wounds Hx, etc.) ASSESSMENTS - Wound and Skin Assessment / Reassessment '[]'$  - Dermatologic / Skin Assessment (not related to wound area) 0 ASSESSMENTS - Ostomy and/or Continence Assessment and Care '[]'$  - Incontinence Assessment and Management 0 '[]'$  - 0 Ostomy Care Assessment and Management (repouching, etc.) PROCESS - Coordination of Care '[]'$  - Simple Patient / Family Education for ongoing care 0 '[]'$  - 0 Complex (extensive) Patient / Family Education for ongoing care '[]'$  - 0 Staff obtains Programmer, systems, Records, Test Results / Process Orders '[]'$  - 0 Staff telephones HHA, Nursing Homes / Clarify orders / etc '[]'$  - 0 Routine Transfer to another Facility (non-emergent condition) '[]'$  - 0 Routine Hospital Admission (non-emergent condition) '[]'$  - 0 New Admissions / Biomedical engineer / Ordering NPWT, Apligraf, etc. '[]'$  - 0 Emergency Hospital Admission (emergent condition) PROCESS - Special Needs '[]'$  - Pediatric / Minor Patient Management 0 '[]'$  - 0 Isolation Patient Management '[]'$  - 0 Hearing / Language / Visual special needs '[]'$  - 0 Assessment of Community assistance (transportation, D/C planning, etc.) '[]'$  - 0 Additional assistance / Altered mentation '[]'$  - 0 Support Surface(s) Assessment (bed, cushion, seat, etc.) INTERVENTIONS - Miscellaneous '[]'$  - External ear exam 0 '[]'$  - 0 Patient Transfer (multiple staff / Civil Service fast streamer / Similar devices) '[]'$  - 0 Simple Staple / Suture removal (25 or less) '[]'$  - 0 Complex Staple / Suture removal (26 or more) '[]'$  - 0 Hypo/Hyperglycemic Management (do not check if billed separately) '[]'$  - 0 Ankle / Brachial Index (ABI) - do not check if billed separately Has the patient been seen at the hospital within the last three years: Yes Total Score: 0 Level Of Care: ____ [name] ([id number]) Electronic Signature(s) Unsigned Entered By: Massie Kluver on  01/26/2022 16:10:15 Signature(s): Date(s): [name] ([id number]) -------------------------------------------------------------------------------- Encounter Discharge Information Details Patient Name: [name] Date of Service:  01/26/2022 3:00 PM Medical Record Number: [id number] Patient Account Number: [id number] Date of Birth/Sex: Jan 07, 1941 (81 y.o. F) Treating RN: Levora Dredge Primary Care Mando Blatz: Derinda Late Other Clinician: Massie Kluver Referring Josh Nicolosi: Derinda Late Treating Aimee Timmons/Extender: Skipper Cliche in Treatment: 1 Encounter Discharge Information Items Post Procedure Vitals Discharge Condition: Stable Temperature (F): 98.4 Ambulatory Status: Wheelchair Pulse (bpm): 54 Discharge Destination: Home Respiratory Rate (breaths/min): 18 Transportation: Private Auto Blood Pressure (mmHg): 143/75 Accompanied By: husband Schedule Follow-up Appointment: Yes Clinical Summary of Care: Electronic Signature(s) Unsigned Entered ByMassie Kluver on 01/26/2022 16:22:29 Signature(s): Date(s): [name] ([id number]) -------------------------------------------------------------------------------- Lower Extremity Assessment Details Patient Name: RUDOLPH, DOBLER Date of Service: 01/26/2022 3:00 PM Medical Record Number: [id number] Patient Account Number: [id number] Date of Birth/Sex: [birth year] (81 y.o. F) Treating RN: Levora Dredge Primary Care Helaina Stefano: Derinda Late Other Clinician: Massie Kluver Referring Kamela Blansett: Derinda Late Treating Breeley Bischof/Extender: Jeri Cos Weeks in Treatment: 1 Edema Assessment Assessed: [Left: No] [Right: No] Edema: [Left: Ye] [Right: s] Calf Left: Right: Point of Measurement: 29 cm From Medial Instep 40.9 cm Ankle Left: Right: Point of Measurement: 8 cm From Medial Instep 22.5 cm Vascular Assessment Pulses: Dorsalis Pedis Palpable: [Left:Yes] Posterior Tibial Palpable:  [Left:Yes] Electronic Signature(s) Unsigned Entered ByMassie Kluver on 01/26/2022 15:52:15 Signature(s): Date(s): [name] ([id number]) -------------------------------------------------------------------------------- Multi Wound Chart Details Patient Name: VIENNA, FOLDEN Date of Service: 01/26/2022 3:00 PM Medical Record Number: [id number] Patient Account Number: [id number] Date of Birth/Sex: 01-13-1941 (81 y.o. F) Treating RN: Levora Dredge Primary Care Zakyia Gagan: Derinda Late Other Clinician: Massie Kluver Referring Sirena Riddle: Derinda Late Treating Silas Sedam/Extender: Skipper Cliche in Treatment: 1 Vital Signs Height(in): 60 Pulse(bpm): 70 Weight(lbs): Blood Pressure(mmHg): 142/75 Body Mass Index(BMI): Temperature(F): 98.4 Respiratory Rate(breaths/min): 18 Photos: [N/A:N/A] Wound Location: Left, Midline Lower Leg N/A N/A Wounding Event: Trauma N/A N/A Primary Etiology: Trauma, Other N/A N/A Comorbid History: Glaucoma, Arrhythmia, N/A N/A Hypertension, End Stage Renal Disease Date Acquired: 12/11/2021 N/A N/A Weeks of Treatment: 1 N/A N/A Wound Status: Open N/A N/A Wound Recurrence: No N/A N/A Measurements L x W x D (cm) 1.3x1x0.1 N/A N/A Area (cm) : 1.021 N/A N/A Volume (cm) : 0.102 N/A N/A % Reduction in Area: 51.90% N/A N/A % Reduction in Volume: 51.90% N/A N/A Classification: Full Thickness Without Exposed N/A N/A Support Structures Exudate Amount: Medium N/A N/A Exudate Type: Serous N/A N/A Exudate Color: amber N/A N/A Wound Margin: Distinct, outline attached N/A N/A Granulation Amount: Small (1-33%) N/A N/A Granulation Quality: Pink N/A N/A Necrotic Amount: Large (67-100%) N/A N/A Exposed Structures: Fat Layer (Subcutaneous Tissue): N/A N/A Yes Fascia: No Tendon: No Muscle: No Joint: No Bone: No Epithelialization: None N/A N/A Treatment Notes Electronic Signature(s) Unsigned Entered ByMassie Kluver on 01/26/2022  15:52:32 Signature(s): Date(s): [name] ([id number] Rio, Candie Echevaria ([id number]) -------------------------------------------------------------------------------- Multi-Disciplinary Care Plan Details Patient Name: CHARIS, JULIANA Date of Service: 01/26/2022 3:00 PM Medical Record Number: [id number] Patient Account Number: [id number] Date of Birth/Sex: [birth year] (81 y.o. F) Treating RN: Levora Dredge Primary Care Tinsley Everman: Derinda Late Other Clinician: Massie Kluver Referring Nichoel Digiulio: Derinda Late Treating Omayra Tulloch/Extender: Skipper Cliche in Treatment: 1 Active Inactive Abuse / Safety / Falls / Self Care Management Nursing Diagnoses: Potential for falls Goals: Patient/caregiver will verbalize understanding of skin care regimen Date Initiated: 01/16/2022 Target Resolution Date: 01/16/2022 Goal Status: Active Interventions: Podiatry chair, stretcher in low position and side rails up as needed Notes: Necrotic Tissue Nursing Diagnoses: Impaired tissue integrity related to necrotic/devitalized tissue Knowledge deficit  related to management of necrotic/devitalized tissue Goals: Necrotic/devitalized tissue will be minimized in the wound bed Date Initiated: 01/16/2022 Target Resolution Date: 01/16/2022 Goal Status: Active Patient/caregiver will verbalize understanding of reason and process for debridement of necrotic tissue Date Initiated: 01/16/2022 Target Resolution Date: 01/16/2022 Goal Status: Active Interventions: Assess patient pain level pre-, during and post procedure and prior to discharge Provide education on necrotic tissue and debridement process Treatment Activities: Excisional debridement : 01/16/2022 Notes: Orientation to the Wound Care Program Nursing Diagnoses: Knowledge deficit related to the wound healing center program Goals: Patient/caregiver will verbalize understanding of the [place] [facility] Program Date Initiated: 01/16/2022 Target  Resolution Date: 01/16/2022 Goal Status: Active Interventions: Provide education on orientation to the wound center Notes: Wound/Skin Impairment Pacha, Mikalia P. ([id number]) Nursing Diagnoses: Knowledge deficit related to smoking impact on wound healing Goals: Patient/caregiver will verbalize understanding of skin care regimen Date Initiated: 01/16/2022 Target Resolution Date: 01/16/2022 Goal Status: Active Ulcer/skin breakdown will have a volume reduction of 30% by week 4 Date Initiated: 01/16/2022 Target Resolution Date: 02/13/2022 Goal Status: Active Interventions: Assess patient/caregiver ability to obtain necessary supplies Assess ulceration(s) every visit Treatment Activities: Skin care regimen initiated : 01/16/2022 Topical wound management initiated : 01/16/2022 Notes: Electronic Signature(s) Unsigned Entered By: Massie Kluver on 01/26/2022 15:52:22 Signature(s): Date(s): [name] ([id number]) -------------------------------------------------------------------------------- Pain Assessment Details Patient Name: JAELANI, POSA Date of Service: 01/26/2022 3:00 PM Medical Record Number: [id number] Patient Account Number: [id number] Date of Birth/Sex: [birth year] (81 y.o. F) Treating RN: Levora Dredge Primary Care Jaquelyne Firkus: Derinda Late Other Clinician: Massie Kluver Referring Valentine Barney: Derinda Late Treating Shenetta Schnackenberg/Extender: Skipper Cliche in Treatment: 1 Active Problems Location of Pain Severity and Description of Pain Patient Has Paino No Site Locations Pain Management and Medication Current Pain Management: Electronic Signature(s) Unsigned Entered By: Massie Kluver on 01/26/2022 15:45:17 Signature(s): Date(s): [name] ([id number]) -------------------------------------------------------------------------------- Patient/Caregiver Education Details Patient Name: [name] Date of Service: 01/26/2022 3:00 PM Medical Record Number:  [id number] Patient Account Number: [id number] Date of Birth/Gender: 10-01-1940 (81 y.o. F) Treating RN: Levora Dredge Primary Care Physician: Derinda Late Other Clinician: Massie Kluver Referring Physician: Derinda Late Treating Physician/Extender: Skipper Cliche in Treatment: 1 Education Assessment Education Provided To: Patient Education Topics Provided Wound/Skin Impairment: Handouts: Other: continue with wound care as directed Methods: Explain/Verbal Responses: State content correctly Electronic Signature(s) Unsigned Entered By: Massie Kluver on 01/26/2022 16:11:04 Signature(s): Date(s): [name] ([id number]) -------------------------------------------------------------------------------- Wound Assessment Details Patient Name: MARZELL, ALLEMAND Date of Service: 01/26/2022 3:00 PM Medical Record Number: [id number] Patient Account Number: [id number] Date of Birth/Sex: 29-Dec-1940 (81 y.o. F) Treating RN: Levora Dredge Primary Care Derenda Giddings: Derinda Late Other Clinician: Massie Kluver Referring Charlottie Peragine: Derinda Late Treating Rakeem Colley/Extender: Skipper Cliche in Treatment: 1 Wound Status Wound Number: 1 Primary Trauma, Other Etiology: Wound Location: Left, Midline Lower Leg Wound Status: Open Wounding Event: Trauma Comorbid Glaucoma, Arrhythmia, Hypertension, End Stage Renal Date Acquired: 12/11/2021 History: Disease Weeks Of Treatment: 1 Clustered Wound: No Photos Wound Measurements Length: (cm) 1.3 Width: (cm) 1 Depth: (cm) 0.1 Area: (cm) 1.021 Volume: (cm) 0.102 % Reduction in Area: 51.9% % Reduction in Volume: 51.9% Epithelialization: None Wound Description Classification: Full Thickness Without Exposed Support Structu Wound Margin: Distinct, outline attached Exudate Amount: Medium Exudate Type: Serous Exudate Color: amber res Foul Odor After Cleansing: No Slough/Fibrino Yes Wound Bed Granulation Amount: Small (1-33%)  Exposed Structure Granulation Quality: Pink Fascia Exposed: No Necrotic Amount: Large (67-100%) Fat Layer (Subcutaneous Tissue) Exposed: Yes  Necrotic Quality: Adherent Slough Tendon Exposed: No Muscle Exposed: No Joint Exposed: No Bone Exposed: No Treatment Notes Wound #1 (Lower Leg) Wound Laterality: Left, Midline Cleanser Peri-Wound Care Topical Gullickson, Yianna P. ([id number]) Primary Dressing Prisma 4.34 (in) Discharge Instruction: Moisten w/normal saline or sterile water; Cover wound as directed. Do not remove from wound bed. Secondary Dressing ABD Pad 5x9 (in/in) Discharge Instruction: Cover with ABD pad Secured With Conform 4'' - Conforming Stretch Gauze Bandage 4x75 (in/in) Discharge Instruction: Apply as directed Compression Wrap Compression Stockings Add-Ons Electronic Signature(s) Unsigned Entered By: Massie Kluver on 01/26/2022 15:50:36 Signature(s): Date(s): [name] ([id number]) -------------------------------------------------------------------------------- Vitals Details Patient Name: [name] Date of Service: 01/26/2022 3:00 PM Medical Record Number: [id number] Patient Account Number: [id number] Date of Birth/Sex: [birth year] (81 y.o. F) Treating RN: Levora Dredge Primary Care Noriel Guthrie: Derinda Late Other Clinician: Massie Kluver Referring Roizy Harold: Derinda Late Treating Deretha Ertle/Extender: Skipper Cliche in Treatment: 1 Vital Signs Time Taken: 15:40 Temperature (F): 98.4 Height (in): 60 Pulse (bpm): 54 Respiratory Rate (breaths/min): 18 Blood Pressure (mmHg): 142/75 Reference Range: 80 - 120 mg / dl Electronic Signature(s) Unsigned Entered ByMassie Kluver on 01/26/2022 15:45:08 Signature(s): Date(s):

## 2022-01-28 ENCOUNTER — Ambulatory Visit: Payer: Medicare PPO

## 2022-01-28 DIAGNOSIS — Z9181 History of falling: Secondary | ICD-10-CM

## 2022-01-28 DIAGNOSIS — M5459 Other low back pain: Secondary | ICD-10-CM

## 2022-01-28 DIAGNOSIS — M5431 Sciatica, right side: Secondary | ICD-10-CM

## 2022-01-28 DIAGNOSIS — M5417 Radiculopathy, lumbosacral region: Secondary | ICD-10-CM

## 2022-01-28 DIAGNOSIS — R262 Difficulty in walking, not elsewhere classified: Secondary | ICD-10-CM

## 2022-01-28 DIAGNOSIS — M6281 Muscle weakness (generalized): Secondary | ICD-10-CM

## 2022-01-28 NOTE — Therapy (Signed)
OUTPATIENT PHYSICAL THERAPY TREATMENT NOTE   Patient Name: Joyce Evans MRN: [id number] DOB:1940/12/28, 81 y.o., female Today's Date: 01/28/2022  PCP: Derinda Late, MD  REFERRING PROVIDER: Phylliss Bob, MD   PT End of Session - 01/28/22 0757     Visit Number 5    Number of Visits 17    Date for PT Re-Evaluation 03/12/22    Authorization Type [employer]    Authorization Time Period cert through March 12, 2022    Progress Note Due on Visit 10    PT Start Time 0758    PT Stop Time [phone]    PT Time Calculation (min) 41 min    Activity Tolerance Patient tolerated treatment well;No increased pain;Patient limited by fatigue    Behavior During Therapy [facility] for tasks assessed/performed              Past Medical History:  Diagnosis Date   Arthritis    osteo - shoulders, fingers   Atrial fibrillation ([place])    Dizziness    thinks because of diuretic   Family history of adverse reaction to anesthesia    daughter -PONV   Gallstones 2016, 2017   GERD (gastroesophageal reflux disease)    Glaucoma    Heart murmur    history of   Hypertension    Melanoma ([place]) 04/20/2016   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 Excised: 05/19/2016, margins free.   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   Tricuspid valve disorder    leak   Past Surgical History:  Procedure Laterality Date   ABDOMINAL HYSTERECTOMY  1987   partial   CARDIAC CATHETERIZATION  2010   [facility]   CATARACT EXTRACTION W/PHACO Right 08/28/2015   Procedure: CATARACT EXTRACTION PHACO AND INTRAOCULAR LENS PLACEMENT ([place]);  Surgeon: Leandrew Koyanagi, MD;  Location: [place];  Service: Ophthalmology;  Laterality: Right;  TORIC   INTRAMEDULLARY (IM) NAIL INTERTROCHANTERIC Right 10/16/2021   Procedure: INTRAMEDULLARY (IM) NAIL INTERTROCHANTRIC;   Surgeon: Renee Harder, MD;  Location: [facility];  Service: Orthopedics;  Laterality: Right;   MELANOMA EXCISION Left 05/19/2016   MIS. Left mid. lat. tricep. 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 S/P IM nail placement for right intertrochanteric hip fracture with Dr. Sharlet Salina on 10/16/2021. Pt slipped on the kithen floor. Pt was getting something from the microwave and pt tripped on her O2 tube. Pt was independent with ambulation prior to her fall. Pt fell before in the woods and pt tripped over a root 3 years ago (no injuries per pt). Pt has been using supplemental O2 for the past 2 years in November. Pt has interstitial lung disease (idiopathic pulmonary fibrosis). Recent breathing test is stable. Back pain began when pt was in Rehab in [place]  place. Feels like it is sciatical. Pt radiates along the sciatic nerve and R L 5 dermatome. Pt is healing fine as far as her R hip surgery is concerned. Also has R medial knee pain due to arthritis. Had home health PT which involves seated knee extension, hip flexion, standing hip abduction, extension. Pt did some walking wiht a rw. Feels a lot of pain all over. Walking too far is limited due to her O2 situation.  PRECAUTIONS: SpO2 not under 90 %  SUBJECTIVE: woke up and was achy in low back and B LE. Did her exercises and took tylenol which helped. Has not had Tylenol for 2-3 days until this morning.     PAIN:  Are you having pain?  No     TODAY'S TREATMENT:   Therapeutic exercise  96% SpO2, 4 L, HR 60  Gait with rw and 4 L O2 for 120 ft, SBA   89 % initially, improved to 94%, then 98%  with rest.    Pt demonstrates R lateral lean, L pelvic drop, increased R lumbar side bend during R LE stance phase   Seated with back unsupported  Diaphragmatic breathing   B scapular retraction 10x5 seocnds    Hip extension isometrics   R 10x5 seconds for 2 sets   L 10x5 seconds for 2 sets    Sitting on pillow so that hips are less than 90 degrees flexion    Clamshells, yellow band 10x2    Seated hip adduction folded pillow squeeze isometrics 10x2 with 5 second holds    92% SpO2, then 98%  Standing with B UE assist  Hip abduction    R 10x   L 10x    Improved exercise technique, movement at target joints, use of target muscles after mod verbal, visual, tactile cues.      Response to treatment  Pt tolerated session well without aggravation of symptoms.      Clinical impression  Worked on glute strengthening and thoracic extension to decrease stress to low back. Worked on thoracic extension and diaphragmatic breathing to improve O2 saturation. Therapeutic rest breaks provided to allow for O2 recovery. Pt tolerated session well without aggravation of symptoms. Pt will benefit from continued skilled physical therapy services to decrease pain, improve strength, gait, and function.      PATIENT EDUCATION: Education details: ther-ex, HEP Person educated: Patient Education method: Explanation, Demonstration, Tactile cues, Verbal cues, and Handouts Education comprehension: verbalized understanding and returned demonstration   HOME EXERCISE PROGRAM: Access Code: [email or web address] URL: [email or web address] Date: 01/20/2022 Prepared by: Joneen Boers  Exercises - Seated Transversus Abdominis Bracing  - 1 x daily - 7 x weekly - 3 sets - 10 reps - 5 seconds hold - Seated Gluteal Sets  - 1 x daily -  7 x weekly - 3 sets - 10 reps - 5 seconds hold - Seated Scapular Retraction  - 1 x daily - 7 x weekly - 3 sets - 10 reps - 5 seconds  hold  - Seated Hip Abduction with Resistance  - 1 x daily - 7 x weekly - 2 sets - 10 reps - Seated Hip Adduction Isometrics with Ball  - 1 x daily - 7 x weekly - 2 sets - 10 reps - 5 seconds hold     PT Short Term Goals - 01/12/22 0912       PT SHORT TERM GOAL #1   Title Pt will be independent with her initial HEP to decrease pain, improve  strength, function, and ability to stand and ambulate more comfortably.    Time 3    Period Weeks    Status New    Target Date 02/05/22              PT Long Term Goals - 01/12/22 0913       PT LONG TERM GOAL #1   Title Pt will have a decrease in low back pain to 3/10 or less at worst to promote ability to stand and ambulate with less difficulty.    Baseline 6/10 low back pain at worst for the past 2 months (01/12/2022)    Time 8    Period Weeks    Status New    Target Date 03/12/22      PT LONG TERM GOAL #2   Title Pt will have a decrease in R LE pain to 3/10 or less at worst to promote ability to stand and ambulate will less difficulty.    Baseline 9/10 R LE pain at worst (01/12/2022)    Time 8    Period Weeks    Status New    Target Date 03/12/22      PT LONG TERM GOAL #3   Title Pt will improve her FOTO score by at least 10 points as a demontsration of improved function.    Baseline Lumbar Spine FOTO 39 (01/12/2022)    Time 8    Period Weeks    Status New    Target Date 03/12/22      PT LONG TERM GOAL #4   Title Pt will be able to ambulate at least 100 ft independently to promote mobility and return to prior level of function.    Baseline Able to ambulate with rw short distance, about 40 ft with reproduction of pain. (01/12/2022)    Time 8    Period Weeks    Status New    Target Date 03/12/22              Plan - 01/28/22 0754     Clinical Impression Statement Worked on glute strengthening  and thoracic extension to decrease stress to low back. Worked on thoracic extension and diaphragmatic breathing to improve O2 saturation. Therapeutic rest breaks provided to allow for O2 recovery. Pt tolerated session well without aggravation of symptoms. Pt will benefit from continued skilled physical therapy services to decrease pain, improve strength, gait, and function.    Personal Factors and Comorbidities Comorbidity 3+;Age;Time since onset of injury/illness/exacerbation;Fitness    Comorbidities Arthritis, atrial fibrillation, HTN, idiopathic pulmonary fibrosis    Examination-Activity Limitations Stairs;Bed Mobility;Stand;Sit;Lift;Transfers;Locomotion Level;Carry    Stability/Clinical Decision Making Stable/Uncomplicated    Clinical Decision Making Low    Rehab Potential Fair    PT Frequency 2x / week    PT Duration 8 weeks    PT Treatment/Interventions Therapeutic activities;Therapeutic exercise;Gait training;Balance training;Neuromuscular re-education;Patient/family education;Manual techniques;Dry needling;Electrical Stimulation;Iontophoresis '4mg'$ /ml Dexamethasone    PT Next Visit Plan Posture, thoracic extension, trunk and glute strength, lumbopelvic control during gait, manual techniques, modalities PRN    Consulted and Agree with Plan of Care Patient               Joneen Boers PT, DPT  01/28/2022, 9:51 AM

## 2022-01-31 ENCOUNTER — Encounter: Payer: Self-pay | Admitting: Dermatology

## 2022-02-02 ENCOUNTER — Encounter: Payer: Medicare PPO | Admitting: Internal Medicine

## 2022-02-02 ENCOUNTER — Ambulatory Visit: Payer: Medicare PPO

## 2022-02-02 DIAGNOSIS — S81812A Laceration without foreign body, left lower leg, initial encounter: Secondary | ICD-10-CM | POA: Diagnosis not present

## 2022-02-02 DIAGNOSIS — M5417 Radiculopathy, lumbosacral region: Secondary | ICD-10-CM

## 2022-02-02 DIAGNOSIS — M5459 Other low back pain: Secondary | ICD-10-CM | POA: Diagnosis not present

## 2022-02-02 DIAGNOSIS — M5431 Sciatica, right side: Secondary | ICD-10-CM

## 2022-02-02 DIAGNOSIS — R262 Difficulty in walking, not elsewhere classified: Secondary | ICD-10-CM

## 2022-02-02 NOTE — Therapy (Signed)
OUTPATIENT PHYSICAL THERAPY TREATMENT NOTE   Patient Name: Joyce Evans MRN: [id number] DOB:1941/04/05, 81 y.o., female Today's Date: 02/02/2022  PCP: Derinda Late, MD  REFERRING PROVIDER: Phylliss Bob, MD   PT End of Session - 02/02/22 [phone]     Visit Number 6    Number of Visits 17    Date for PT Re-Evaluation 03/12/22    Authorization Type [employer]    Authorization Time Period cert through March 12, 2022    Progress Note Due on Visit 10    PT Start Time 0845    PT Stop Time 0929    PT Time Calculation (min) 44 min    Activity Tolerance Patient tolerated treatment well;No increased pain    Behavior During Therapy [facility] for tasks assessed/performed              Past Medical History:  Diagnosis Date   Arthritis    osteo - shoulders, fingers   Atrial fibrillation ([place])    Dizziness    thinks because of diuretic   Family history of adverse reaction to anesthesia    daughter -PONV   Gallstones 2016, 2017   GERD (gastroesophageal reflux disease)    Glaucoma    Heart murmur    history of   Hypertension    Melanoma ([place]) 04/20/2016   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 Excised: 05/19/2016, margins free.   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   Tricuspid valve disorder    leak   Past Surgical History:  Procedure Laterality Date   ABDOMINAL HYSTERECTOMY  1987   partial   CARDIAC CATHETERIZATION  2010   [facility]   CATARACT EXTRACTION W/PHACO Right 08/28/2015   Procedure: CATARACT EXTRACTION PHACO AND INTRAOCULAR LENS PLACEMENT ([place]);  Surgeon: Leandrew Koyanagi, MD;  Location: [place];  Service: Ophthalmology;  Laterality: Right;  TORIC   INTRAMEDULLARY (IM) NAIL INTERTROCHANTERIC Right 10/16/2021   Procedure: INTRAMEDULLARY (IM) NAIL INTERTROCHANTRIC;  Surgeon: Renee Harder, MD;   Location: [facility];  Service: Orthopedics;  Laterality: Right;   MELANOMA EXCISION Left 05/19/2016   MIS. Left mid. lat. tricep. 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 S/P IM nail placement for right intertrochanteric hip fracture with Dr. Sharlet Salina on 10/16/2021. Pt slipped on the kithen floor. Pt was getting something from the microwave and pt tripped on her O2 tube. Pt was independent with ambulation prior to her fall. Pt fell before in the woods and pt tripped over a root 3 years ago (no injuries per pt). Pt has been using supplemental O2 for the past 2 years in November. Pt has interstitial lung disease (idiopathic pulmonary fibrosis). Recent breathing test is stable. Back pain began when pt was in Rehab in [place] place. Feels like it is sciatical. Pt radiates along the sciatic  nerve and R L 5 dermatome. Pt is healing fine as far as her R hip surgery is concerned. Also has R medial knee pain due to arthritis. Had home health PT which involves seated knee extension, hip flexion, standing hip abduction, extension. Pt did some walking wiht a rw. Feels a lot of pain all over. Walking too far is limited due to her O2 situation.  PRECAUTIONS: SpO2 not under 90 %  SUBJECTIVE: Pain yesterday with standing in low back up to 6/10 NPS. None sitting here. Feels like PT is helping her strength and pain overall    PAIN:  Are you having pain? No     TODAY'S TREATMENT:   02/02/22:  Therapeutic exercise  96% SpO2, 3 L at rest, HR 75  BPM  Gait with RW: 120' on 4L/min with SPO2 desaturation to 80%. Quickly returns up to 90% with seated rest and PLB. HR up to 100 BPM. SBA provided.    Maintained on 3 L of O2 with SPO2 > 90% throughout seated exercises.    Seated B scap retractions: YTB, 3x10 with back unsupported. Minor posterior lean requiring VC's throughout to maintain upright posture.    Seated on pillow, hip clam shells with YTB: 3x10 with back unsupported and scapular depression and retraction with good carryover. Reports easy. Provided RTB for future performance and strengthening   STS to RW, 2x6 with VC's for upright posture for posterior chain strengthening, SBA with excellent balance without need for UE support. Some hand placement on thighs to assist in standing     Standing 3 way hip (flexion marches, abduction, extension) with BUE support on RW. SBA, 2x8. Max TC's on pelvis and shoulder for upright posture. Reports difficulty when posture corrected.Some minor R knee pain standing on RLE.      Improved exercise technique, movement at target joints, use of target muscles after mod verbal, visual, tactile cues.      Response to treatment  Pt tolerated session well without aggravation of symptoms.      Clinical impression Continuing PT POC with focus on thoracic extension, periscapular/postural and hip strengthening. Therapeutic rest breaks throughout due to history of interstitial lung disease. Intermittent need for titration to 4 L/min of O2 for standing and gait exercises. Encouraged pt with seated tasks to perform with back unsupported to improve core stability with exercise. Pt will continue to benefit from skilled PT services to progress LE strengthening and decreased back pain for improved tolerance in standing and walking ADL's.    PATIENT EDUCATION: Education details: ther-ex, HEP Person educated: Patient Education method: Explanation, Demonstration, Tactile cues, Verbal cues, and  Handouts Education comprehension: verbalized understanding and returned demonstration   HOME EXERCISE PROGRAM: Access Code: [email or web address] URL: [email or web address] Date: 01/20/2022 Prepared by: Joneen Boers  Exercises - Seated Transversus Abdominis Bracing  - 1 x daily - 7 x weekly - 3 sets - 10 reps - 5 seconds hold - Seated Gluteal Sets  - 1 x daily - 7 x weekly - 3 sets - 10 reps - 5 seconds hold - Seated Scapular Retraction  - 1 x daily - 7 x weekly - 3 sets - 10 reps - 5 seconds  hold  - Seated Hip Abduction with Resistance  - 1 x daily - 7 x weekly - 2 sets - 10 reps - Seated Hip Adduction Isometrics with Ball  - 1 x daily - 7 x weekly - 2 sets - 10 reps - 5 seconds hold  PT Short Term Goals - 01/12/22 0912       PT SHORT TERM GOAL #1   Title Pt will be independent with her initial HEP to decrease pain, improve strength, function, and ability to stand and ambulate more comfortably.    Time 3    Period Weeks    Status New    Target Date 02/05/22              PT Long Term Goals - 01/12/22 0913       PT LONG TERM GOAL #1   Title Pt will have a decrease in low back pain to 3/10 or less at worst to promote ability to stand and ambulate with less difficulty.    Baseline 6/10 low back pain at worst for the past 2 months (01/12/2022)    Time 8    Period Weeks    Status New    Target Date 03/12/22      PT LONG TERM GOAL #2   Title Pt will have a decrease in R LE pain to 3/10 or less at worst to promote ability to stand and ambulate will less difficulty.    Baseline 9/10 R LE pain at worst (01/12/2022)    Time 8    Period Weeks    Status New    Target Date 03/12/22      PT LONG TERM GOAL #3   Title Pt will improve her FOTO score by at least 10 points as a demontsration of improved function.    Baseline Lumbar Spine FOTO 39 (01/12/2022)    Time 8    Period Weeks    Status New    Target Date 03/12/22      PT LONG TERM GOAL #4   Title Pt will be able to  ambulate at least 100 ft independently to promote mobility and return to prior level of function.    Baseline Able to ambulate with rw short distance, about 40 ft with reproduction of pain. (01/12/2022)    Time 8    Period Weeks    Status New    Target Date 03/12/22             Salem Caster. Fairly IV, PT, DPT Physical Therapist- [facility]  02/02/2022, 9:39 AM

## 2022-02-04 ENCOUNTER — Ambulatory Visit: Payer: Medicare PPO

## 2022-02-04 DIAGNOSIS — M6281 Muscle weakness (generalized): Secondary | ICD-10-CM

## 2022-02-04 DIAGNOSIS — M5459 Other low back pain: Secondary | ICD-10-CM

## 2022-02-04 DIAGNOSIS — M5431 Sciatica, right side: Secondary | ICD-10-CM

## 2022-02-04 DIAGNOSIS — R262 Difficulty in walking, not elsewhere classified: Secondary | ICD-10-CM

## 2022-02-04 DIAGNOSIS — Z9181 History of falling: Secondary | ICD-10-CM

## 2022-02-04 DIAGNOSIS — M5417 Radiculopathy, lumbosacral region: Secondary | ICD-10-CM

## 2022-02-04 NOTE — Progress Notes (Signed)
Joyce, Evans ([id number]) Visit Report for 02/02/2022 Arrival Information Details Patient Name: Joyce Evans, Joyce Evans Date of Service: 02/02/2022 10:30 AM Medical Record Number: [id number] Patient Account Number: [id number] Date of Birth/Sex: 1941-03-16 (80 y.o. F) Treating RN: Cornell Barman Primary Care Donie Lemelin: Derinda Late Other Clinician: Massie Kluver Referring Jameila Keeny: Derinda Late Treating Khing Belcher/Extender: Tito Dine in Treatment: 2 Visit Information History Since Last Visit All ordered tests and consults were completed: No Patient Arrived: Wheel Chair Added or deleted any medications: No Arrival Time: 11:02 Any new allergies or adverse reactions: No Transfer Assistance: EasyPivot Patient Lift Had a fall or experienced change in No Patient Requires Transmission-Based No activities of daily living that may affect Precautions: risk of falls: Patient Has Alerts: Yes Hospitalized since last visit: No Patient Alerts: Patient on Blood Pain Present Now: Yes Thinner Eliquis NOT DIABETIC Electronic Signature(s) Signed: 02/04/2022 8:24:32 AM By: Massie Kluver Entered By: Massie Kluver on 02/02/2022 11:03:20 Joyce Evans ([id number]) -------------------------------------------------------------------------------- Clinic Level of Care Assessment Details Patient Name: Joyce Evans Date of Service: 02/02/2022 10:30 AM Medical Record Number: [id number] Patient Account Number: [id number] Date of Birth/Sex: [birth year] (80 y.o. F) Treating RN: Cornell Barman Primary Care Makinley Muscato: Derinda Late Other Clinician: Massie Kluver Referring Liesel Peckenpaugh: Derinda Late Treating Luismiguel Lamere/Extender: Tito Dine in Treatment: 2 Clinic Level of Care Assessment Items TOOL 1 Quantity Score '[]'$  - Use when EandM and Procedure is performed on INITIAL visit 0 ASSESSMENTS - Nursing Assessment / Reassessment '[]'$  - General Physical Exam (combine w/ comprehensive  assessment (listed just below) when performed on new 0 pt. evals) '[]'$  - 0 Comprehensive Assessment (HX, ROS, Risk Assessments, Wounds Hx, etc.) ASSESSMENTS - Wound and Skin Assessment / Reassessment '[]'$  - Dermatologic / Skin Assessment (not related to wound area) 0 ASSESSMENTS - Ostomy and/or Continence Assessment and Care '[]'$  - Incontinence Assessment and Management 0 '[]'$  - 0 Ostomy Care Assessment and Management (repouching, etc.) PROCESS - Coordination of Care '[]'$  - Simple Patient / Family Education for ongoing care 0 '[]'$  - 0 Complex (extensive) Patient / Family Education for ongoing care '[]'$  - 0 Staff obtains Programmer, systems, Records, Test Results / Process Orders '[]'$  - 0 Staff telephones HHA, Nursing Homes / Clarify orders / etc '[]'$  - 0 Routine Transfer to another Facility (non-emergent condition) '[]'$  - 0 Routine Hospital Admission (non-emergent condition) '[]'$  - 0 New Admissions / Biomedical engineer / Ordering NPWT, Apligraf, etc. '[]'$  - 0 Emergency Hospital Admission (emergent condition) PROCESS - Special Needs '[]'$  - Pediatric / Minor Patient Management 0 '[]'$  - 0 Isolation Patient Management '[]'$  - 0 Hearing / Language / Visual special needs '[]'$  - 0 Assessment of Community assistance (transportation, D/C planning, etc.) '[]'$  - 0 Additional assistance / Altered mentation '[]'$  - 0 Support Surface(s) Assessment (bed, cushion, seat, etc.) INTERVENTIONS - Miscellaneous '[]'$  - External ear exam 0 '[]'$  - 0 Patient Transfer (multiple staff / Civil Service fast streamer / Similar devices) '[]'$  - 0 Simple Staple / Suture removal (25 or less) '[]'$  - 0 Complex Staple / Suture removal (26 or more) '[]'$  - 0 Hypo/Hyperglycemic Management (do not check if billed separately) '[]'$  - 0 Ankle / Brachial Index (ABI) - do not check if billed separately Has the patient been seen at the hospital within the last three years: Yes Total Score: 0 Level Of Care: ____ Joyce Evans ([id number]) Electronic Signature(s) Signed:  02/04/2022 8:24:32 AM By: Massie Kluver Entered By: Massie Kluver on 02/02/2022 11:26:05 Luallen, Candie Echevaria ([id number]) -------------------------------------------------------------------------------- Encounter Discharge  Information Details Patient Name: Joyce, Evans Date of Service: 02/02/2022 10:30 AM Medical Record Number: [id number] Patient Account Number: [id number] Date of Birth/Sex: 09-30-1940 (80 y.o. F) Treating RN: Cornell Barman Primary Care Camrie Stock: Derinda Late Other Clinician: Massie Kluver Referring Manika Hast: Derinda Late Treating Sarahgrace Broman/Extender: Tito Dine in Treatment: 2 Encounter Discharge Information Items Post Procedure Vitals Discharge Condition: Stable Temperature (F): 97.8 Ambulatory Status: Wheelchair Pulse (bpm): 61 Discharge Destination: Home Respiratory Rate (breaths/min): 18 Transportation: Private Auto Blood Pressure (mmHg): 145/74 Accompanied By: husband Schedule Follow-up Appointment: Yes Clinical Summary of Care: Electronic Signature(s) Signed: 02/04/2022 8:24:32 AM By: Massie Kluver Entered By: Massie Kluver on 02/02/2022 11:37:53 Keimig, Candie Echevaria ([id number]) -------------------------------------------------------------------------------- Lower Extremity Assessment Details Patient Name: Joyce Evans Date of Service: 02/02/2022 10:30 AM Medical Record Number: [id number] Patient Account Number: [id number] Date of Birth/Sex: 11-Aug-1940 (80 y.o. F) Treating RN: Cornell Barman Primary Care Trevin Gartrell: Derinda Late Other Clinician: Massie Kluver Referring Alvino Lechuga: Derinda Late Treating Demontrae Gilbert/Extender: Tito Dine in Treatment: 2 Edema Assessment Assessed: [Left: Yes] [Right: No] Edema: [Left: Ye] [Right: s] Calf Left: Right: Point of Measurement: 29 cm From Medial Instep 41 cm Ankle Left: Right: Point of Measurement: 8 cm From Medial Instep 26.4 cm Vascular Assessment Pulses: Dorsalis  Pedis Palpable: [Left:Yes] Electronic Signature(s) Signed: 02/02/2022 5:31:53 PM By: Gretta Cool, BSN, RN, CWS, Kim RN, BSN Signed: 02/04/2022 8:24:32 AM By: Massie Kluver Entered By: Massie Kluver on 02/02/2022 11:14:42 Dornbush, Candie Echevaria ([id number]) -------------------------------------------------------------------------------- Multi Wound Chart Details Patient Name: Joyce Evans Date of Service: 02/02/2022 10:30 AM Medical Record Number: [id number] Patient Account Number: [id number] Date of Birth/Sex: 05/16/1941 (80 y.o. F) Treating RN: Cornell Barman Primary Care Jolicia Delira: Derinda Late Other Clinician: Massie Kluver Referring Waleed Dettman: Derinda Late Treating Milano Rosevear/Extender: Tito Dine in Treatment: 2 Vital Signs Height(in): 60 Pulse(bpm): 61 Weight(lbs): Blood Pressure(mmHg): 145/74 Body Mass Index(BMI): Temperature(F): 97.8 Respiratory Rate(breaths/min): 18 Photos: [N/A:N/A] Wound Location: Left, Midline Lower Leg N/A N/A Wounding Event: Trauma N/A N/A Primary Etiology: Trauma, Other N/A N/A Comorbid History: Glaucoma, Arrhythmia, N/A N/A Hypertension, End Stage Renal Disease Date Acquired: 12/11/2021 N/A N/A Weeks of Treatment: 2 N/A N/A Wound Status: Open N/A N/A Wound Recurrence: No N/A N/A Measurements L x W x D (cm) 1.3x0.8x0.1 N/A N/A Area (cm) : 0.817 N/A N/A Volume (cm) : 0.082 N/A N/A % Reduction in Area: 61.50% N/A N/A % Reduction in Volume: 61.30% N/A N/A Classification: Full Thickness Without Exposed N/A N/A Support Structures Exudate Amount: Medium N/A N/A Exudate Type: Serous N/A N/A Exudate Color: amber N/A N/A Wound Margin: Distinct, outline attached N/A N/A Granulation Amount: Small (1-33%) N/A N/A Granulation Quality: Pink N/A N/A Necrotic Amount: Large (67-100%) N/A N/A Exposed Structures: Fat Layer (Subcutaneous Tissue): N/A N/A Yes Fascia: No Tendon: No Muscle: No Joint: No Bone: No Epithelialization: None N/A  N/A Treatment Notes Electronic Signature(s) Signed: 02/04/2022 8:24:32 AM By: Massie Kluver Entered By: Massie Kluver on 02/02/2022 11:14:53 Joyce Evans ([id number] Rio, Candie Echevaria ([id number]) -------------------------------------------------------------------------------- Multi-Disciplinary Care Plan Details Patient Name: Joyce Evans Date of Service: 02/02/2022 10:30 AM Medical Record Number: [id number] Patient Account Number: [id number] Date of Birth/Sex: [birth year] (80 y.o. F) Treating RN: Cornell Barman Primary Care Arlow Spiers: Derinda Late Other Clinician: Massie Kluver Referring Kasen Adduci: Derinda Late Treating Collen Hostler/Extender: Tito Dine in Treatment: 2 Active Inactive Abuse / Safety / Falls / Self Care Management Nursing Diagnoses: Potential for falls Goals: Patient/caregiver will verbalize understanding of skin care regimen Date Initiated: 01/16/2022  Target Resolution Date: 01/16/2022 Goal Status: Active Interventions: Podiatry chair, stretcher in low position and side rails up as needed Notes: Necrotic Tissue Nursing Diagnoses: Impaired tissue integrity related to necrotic/devitalized tissue Knowledge deficit related to management of necrotic/devitalized tissue Goals: Necrotic/devitalized tissue will be minimized in the wound bed Date Initiated: 01/16/2022 Target Resolution Date: 01/16/2022 Goal Status: Active Patient/caregiver will verbalize understanding of reason and process for debridement of necrotic tissue Date Initiated: 01/16/2022 Target Resolution Date: 01/16/2022 Goal Status: Active Interventions: Assess patient pain level pre-, during and post procedure and prior to discharge Provide education on necrotic tissue and debridement process Treatment Activities: Excisional debridement : 01/16/2022 Notes: Orientation to the Wound Care Program Nursing Diagnoses: Knowledge deficit related to the wound healing center  program Goals: Patient/caregiver will verbalize understanding of the [facility] Date Initiated: 01/16/2022 Target Resolution Date: 01/16/2022 Goal Status: Active Interventions: Provide education on orientation to the wound center Notes: Wound/Skin Impairment Aufiero, Greidy P. ([id number]) Nursing Diagnoses: Knowledge deficit related to smoking impact on wound healing Goals: Patient/caregiver will verbalize understanding of skin care regimen Date Initiated: 01/16/2022 Target Resolution Date: 01/16/2022 Goal Status: Active Ulcer/skin breakdown will have a volume reduction of 30% by week 4 Date Initiated: 01/16/2022 Target Resolution Date: 02/13/2022 Goal Status: Active Interventions: Assess patient/caregiver ability to obtain necessary supplies Assess ulceration(s) every visit Treatment Activities: Skin care regimen initiated : 01/16/2022 Topical wound management initiated : 01/16/2022 Notes: Electronic Signature(s) Signed: 02/02/2022 5:31:53 PM By: Gretta Cool, BSN, RN, CWS, Kim RN, BSN Signed: 02/04/2022 8:24:32 AM By: Massie Kluver Entered By: Massie Kluver on 02/02/2022 11:14:47 Joyce Evans ([id number]) -------------------------------------------------------------------------------- Pain Assessment Details Patient Name: Joyce Evans Date of Service: 02/02/2022 10:30 AM Medical Record Number: [id number] Patient Account Number: [id number] Date of Birth/Sex: Jul 31, 1940 (80 y.o. F) Treating RN: Cornell Barman Primary Care Izan Miron: Derinda Late Other Clinician: Massie Kluver Referring Samayra Hebel: Derinda Late Treating Shloima Clinch/Extender: Tito Dine in Treatment: 2 Active Problems Location of Pain Severity and Description of Pain Patient Has Paino Yes Site Locations Pain Location: Pain in Ulcers Duration of the Pain. Constant / Intermittento Intermittent Rate the pain. Current Pain Level: 1 Worst Pain Level: 6 Character of Pain Describe the Pain:  Throbbing Pain Management and Medication Current Pain Management: Rest: Yes Electronic Signature(s) Signed: 02/02/2022 5:31:53 PM By: Gretta Cool, BSN, RN, CWS, Kim RN, BSN Signed: 02/04/2022 8:24:32 AM By: Massie Kluver Entered By: Massie Kluver on 02/02/2022 11:06:47 Joyce Evans ([id number]) -------------------------------------------------------------------------------- Patient/Caregiver Education Details Patient Name: Joyce Evans Date of Service: 02/02/2022 10:30 AM Medical Record Number: [id number] Patient Account Number: [id number] Date of Birth/Gender: 1940-07-16 (80 y.o. F) Treating RN: Cornell Barman Primary Care Physician: Derinda Late Other Clinician: Massie Kluver Referring Physician: Derinda Late Treating Physician/Extender: Tito Dine in Treatment: 2 Education Assessment Education Provided To: Patient Education Topics Provided Wound/Skin Impairment: Handouts: Other: continue wound care as directed Electronic Signature(s) Signed: 02/04/2022 8:24:32 AM By: Massie Kluver Entered By: Massie Kluver on 02/02/2022 11:26:33 Joyce Evans ([id number]) -------------------------------------------------------------------------------- Wound Assessment Details Patient Name: Joyce Evans Date of Service: 02/02/2022 10:30 AM Medical Record Number: [id number] Patient Account Number: [id number] Date of Birth/Sex: 12-06-1940 (80 y.o. F) Treating RN: Cornell Barman Primary Care Maison Kestenbaum: Derinda Late Other Clinician: Massie Kluver Referring Irmgard Rampersaud: Derinda Late Treating Eder Macek/Extender: Tito Dine in Treatment: 2 Wound Status Wound Number: 1 Primary Trauma, Other Etiology: Wound Location: Left, Midline Lower Leg Wound Status: Open Wounding Event: Trauma Comorbid Glaucoma, Arrhythmia, Hypertension, End Stage  Renal Date Acquired: 12/11/2021 History: Disease Weeks Of Treatment: 2 Clustered Wound: No Photos Wound  Measurements Length: (cm) 1.3 Width: (cm) 0.8 Depth: (cm) 0.1 Area: (cm) 0.817 Volume: (cm) 0.082 % Reduction in Area: 61.5% % Reduction in Volume: 61.3% Epithelialization: None Wound Description Classification: Full Thickness Without Exposed Support Structu Wound Margin: Distinct, outline attached Exudate Amount: Medium Exudate Type: Serous Exudate Color: amber res Foul Odor After Cleansing: No Slough/Fibrino Yes Wound Bed Granulation Amount: Small (1-33%) Exposed Structure Granulation Quality: Pink Fascia Exposed: No Necrotic Amount: Large (67-100%) Fat Layer (Subcutaneous Tissue) Exposed: Yes Necrotic Quality: Adherent Slough Tendon Exposed: No Muscle Exposed: No Joint Exposed: No Bone Exposed: No Treatment Notes Wound #1 (Lower Leg) Wound Laterality: Left, Midline Cleanser Peri-Wound Care Topical Ryles, Lauralie P. ([id number]) Primary Dressing Prisma 4.34 (in) Discharge Instruction: Moisten w/normal saline or sterile water; Cover wound as directed. Do not remove from wound bed. Secondary Dressing ABD Pad 5x9 (in/in) Discharge Instruction: Cover with ABD pad Secured With Conform 4'' - Conforming Stretch Gauze Bandage 4x75 (in/in) Discharge Instruction: Apply as directed Compression Wrap Compression Stockings Add-Ons Electronic Signature(s) Signed: 02/02/2022 5:31:53 PM By: Gretta Cool, BSN, RN, CWS, Kim RN, BSN Signed: 02/04/2022 8:24:32 AM By: Massie Kluver Entered By: Massie Kluver on 02/02/2022 11:13:29 Joyce Evans ([id number]) -------------------------------------------------------------------------------- Vitals Details Patient Name: Joyce Evans Date of Service: 02/02/2022 10:30 AM Medical Record Number: [id number] Patient Account Number: [id number] Date of Birth/Sex: Sep 21, 1940 (80 y.o. F) Treating RN: Cornell Barman Primary Care Denasia Venn: Derinda Late Other Clinician: Massie Kluver Referring Alicja Everitt: Derinda Late Treating Naelani Lafrance/Extender:  Tito Dine in Treatment: 2 Vital Signs Time Taken: 11:05 Temperature (F): 97.8 Height (in): 60 Pulse (bpm): 61 Respiratory Rate (breaths/min): 18 Blood Pressure (mmHg): 145/74 Reference Range: 80 - 120 mg / dl Electronic Signature(s) Signed: 02/04/2022 8:24:32 AM By: Massie Kluver Entered By: Massie Kluver on 02/02/2022 11:06:31

## 2022-02-04 NOTE — Therapy (Signed)
OUTPATIENT PHYSICAL THERAPY TREATMENT NOTE   Patient Name: Joyce Evans MRN: [id number] DOB:12-08-1940, 81 y.o., female Today's Date: 02/04/2022  PCP: Derinda Late, MD  REFERRING PROVIDER: Phylliss Bob, MD   PT End of Session - 02/04/22 1148     Visit Number 7    Number of Visits 17    Date for PT Re-Evaluation 03/12/22    Authorization Type [employer]    Authorization Time Period cert through March 12, 2022    Progress Note Due on Visit 10    PT Start Time 1148    PT Stop Time 1229    PT Time Calculation (min) 41 min    Activity Tolerance Patient tolerated treatment well;No increased pain    Behavior During Therapy [facility] for tasks assessed/performed               Past Medical History:  Diagnosis Date   Arthritis    osteo - shoulders, fingers   Atrial fibrillation ([place])    Dizziness    thinks because of diuretic   Family history of adverse reaction to anesthesia    daughter -PONV   Gallstones 2016, 2017   GERD (gastroesophageal reflux disease)    Glaucoma    Heart murmur    history of   Hypertension    Melanoma ([place]) 04/20/2016   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 Excised: 05/19/2016, margins free.   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   Tricuspid valve disorder    leak   Past Surgical History:  Procedure Laterality Date   ABDOMINAL HYSTERECTOMY  1987   partial   CARDIAC CATHETERIZATION  2010   [facility]   CATARACT EXTRACTION W/PHACO Right 08/28/2015   Procedure: CATARACT EXTRACTION PHACO AND INTRAOCULAR LENS PLACEMENT ([place]);  Surgeon: Leandrew Koyanagi, MD;  Location: [place];  Service: Ophthalmology;  Laterality: Right;  TORIC   INTRAMEDULLARY (IM) NAIL INTERTROCHANTERIC Right 10/16/2021   Procedure: INTRAMEDULLARY (IM) NAIL INTERTROCHANTRIC;  Surgeon: Renee Harder, MD;   Location: [facility];  Service: Orthopedics;  Laterality: Right;   MELANOMA EXCISION Left 05/19/2016   MIS. Left mid. lat. tricep. 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 S/P IM nail placement for right intertrochanteric hip fracture with Dr. Sharlet Salina on 10/16/2021. Pt slipped on the kithen floor. Pt was getting something from the microwave and pt tripped on her O2 tube. Pt was independent with ambulation prior to her fall. Pt fell before in the woods and pt tripped over a root 3 years ago (no injuries per pt). Pt has been using supplemental O2 for the past 2 years in November. Pt has interstitial lung disease (idiopathic pulmonary fibrosis). Recent breathing test is stable. Back pain began when pt was in Rehab in [place] [facility]. Feels  like it is sciatical. Pt radiates along the sciatic nerve and R L 5 dermatome. Pt is healing fine as far as her R hip surgery is concerned. Also has R medial knee pain due to arthritis. Had home health PT which involves seated knee extension, hip flexion, standing hip abduction, extension. Pt did some walking wiht a rw. Feels a lot of pain all over. Walking too far is limited due to her O2 situation.  PRECAUTIONS: SpO2 not under 90 %  SUBJECTIVE: Back bothers her when she does her band exercise as well as standing. Has been standing a lot. L heel has been bothering her at night, sometimes the right. Started about a couple of weeks ago. Usually goes away when  she does heel pumps. Heel pain usually happens at night. Exercise helps with her oxygen. Then trunk strengthening exercises help her back feel better.     PAIN:  Are you having pain? 4/10 currently     TODAY'S TREATMENT:   Therapeutic exercise  98% SpO2, 3 L, HR 69  Seated with back unsupported    B scapular retraction 10x5 seocnds     Seated transversus abdominis contraction with glute max contraction 10x2 with 5 second holds    Then with B scapular retraction 10x5 seconds    Hip extension isometrics   R 10x5 seconds then 5x5 seconds   L 10x5 seconds then 5x5 seconds     Sitting on pillow so that hips are less than 90 degrees flexion    Clamshells, red band 10x3   No pain reported    Seated hip adduction folded pillow squeeze isometrics 10x3 with 5 second holds    94% SpO2    Standing with B UE assist  Hip abduction    R 10x   L 10x    89% SpO2 (on 3 L O2), then increased to 4L. 95% then 98% SpO2 afterwards.    Then Hip abduction after rest   R 10x   L 10x    92 % to 94% SpO2 4 L    Improved exercise technique, movement at target joints, use of target muscles after mod verbal, visual, tactile cues.      Response to treatment  Pt tolerated session well without aggravation of symptoms. No back pain reported after session.      Clinical impression  Continued working on trunk and glute strengthening, as well  as thoracic extension to decrease stress to low back. Demonstrates core and glute weakness especially in standing. Therapeutic rest breaks provided as needed to allow for body to recover from exercise and due to oxygen. Pt tolerated session well without aggravation of symptoms. No back pain reported after session.  Pt will benefit from continued skilled physical therapy services to decrease pain, improve strength, gait, and function.      PATIENT EDUCATION: Education details: ther-ex, HEP Person educated: Patient Education method: Explanation,  Demonstration, Tactile cues, Verbal cues, and Handouts Education comprehension: verbalized understanding and returned demonstration   HOME EXERCISE PROGRAM: Access Code: [email or web address] URL: [email or web address] Date: 01/20/2022 Prepared by: Joneen Boers  Exercises - Seated Transversus Abdominis Bracing  - 1 x daily - 7 x weekly - 3 sets - 10 reps - 5 seconds hold - Seated Gluteal Sets  - 1 x daily - 7 x weekly - 3 sets - 10 reps - 5 seconds hold - Seated Scapular Retraction  - 1 x daily - 7 x weekly - 3 sets - 10 reps - 5 seconds  hold  - Seated Hip Abduction with Resistance  - 1 x daily - 7 x weekly - 2 sets - 10 reps - Seated Hip Adduction Isometrics with Ball  - 1 x daily - 7 x weekly - 2 sets - 10 reps - 5 seconds hold     PT Short Term Goals - 01/12/22 0912       PT SHORT TERM GOAL #1   Title Pt will be independent with her initial HEP to decrease pain, improve strength, function, and ability to stand and ambulate more comfortably.    Time 3    Period Weeks    Status New    Target Date 02/05/22              PT Long Term Goals - 01/12/22 0913       PT LONG TERM GOAL #1   Title Pt will have a decrease in low back pain to 3/10 or less at worst to promote ability to stand and ambulate with less difficulty.    Baseline 6/10 low back pain at worst for the past 2 months (01/12/2022)    Time 8    Period Weeks    Status New    Target Date 03/12/22      PT LONG TERM GOAL #2   Title Pt will have a decrease in R LE pain to 3/10 or less at worst to promote ability to stand and ambulate will less difficulty.    Baseline 9/10 R LE pain at worst (01/12/2022)    Time 8    Period Weeks    Status New    Target Date 03/12/22      PT LONG TERM GOAL #3   Title Pt will improve her FOTO score by at least 10 points as a demontsration of improved function.    Baseline Lumbar Spine FOTO 39 (01/12/2022)    Time 8    Period Weeks    Status New    Target Date 03/12/22      PT  LONG TERM GOAL #4   Title Pt will be able to ambulate at least 100 ft independently to promote mobility and return to prior level of function.    Baseline Able to ambulate with rw short distance, about 40 ft with reproduction of pain. (01/12/2022)    Time 8    Period Weeks    Status New    Target Date 03/12/22              Plan - 02/04/22 1202     Clinical Impression Statement Continued working on trunk and glute strengthening, as well  as thoracic extension to decrease stress to low back. Demonstrates core and glute weakness especially in standing. Therapeutic rest breaks provided as needed to allow for body to recover from exercise and due to oxygen. Pt tolerated session well without aggravation of symptoms. No back pain reported after session.  Pt will benefit from continued skilled physical therapy services to decrease pain, improve strength, gait, and function.    Personal Factors and Comorbidities Comorbidity 3+;Age;Time since onset of injury/illness/exacerbation;Fitness    Comorbidities Arthritis, atrial fibrillation, HTN, idiopathic pulmonary fibrosis    Examination-Activity Limitations Stairs;Bed Mobility;Stand;Sit;Lift;Transfers;Locomotion Level;Carry    Stability/Clinical Decision Making Stable/Uncomplicated    Rehab Potential Fair    PT Frequency 2x / week    PT Duration 8 weeks    PT Treatment/Interventions Therapeutic activities;Therapeutic exercise;Gait training;Balance training;Neuromuscular re-education;Patient/family education;Manual techniques;Dry needling;Electrical Stimulation;Iontophoresis '4mg'$ /ml Dexamethasone    PT  Next Visit Plan Posture, thoracic extension, trunk and glute strength, lumbopelvic control during gait, manual techniques, modalities PRN    Consulted and Agree with Plan of Care Patient                Joneen Boers PT, DPT  02/04/2022, 12:36 PM

## 2022-02-04 NOTE — Progress Notes (Signed)
BECCI, BATTY ([id number]) Visit Report for 02/02/2022 Debridement Details Patient Name: Joyce Evans, Joyce Evans Date of Service: 02/02/2022 10:30 AM Medical Record Number: [id number] Patient Account Number: [id number] Date of Birth/Sex: [birth year] (80 y.o. F) Treating RN: Cornell Barman Primary Care Provider: Derinda Late Other Clinician: Massie Kluver Referring Provider: Derinda Late Treating Provider/Extender: Tito Dine in Treatment: 2 Debridement Performed for Wound #1 Left,Midline Lower Leg Assessment: Performed By: Physician Ricard Dillon, MD Debridement Type: Debridement Level of Consciousness (Pre- Awake and Alert procedure): Pre-procedure Verification/Time Out Yes - 11:22 Taken: Start Time: 11:22 Total Area Debrided (L x W): 1.3 (cm) x 0.8 (cm) = 1.04 (cm) Tissue and other material Viable, Non-Viable, Slough, Subcutaneous, Slough debrided: Level: Skin/Subcutaneous Tissue Debridement Description: Excisional Instrument: Curette Bleeding: Minimum Hemostasis Achieved: Pressure End Time: 11:23 Response to Treatment: Procedure was tolerated well Level of Consciousness (Post- Awake and Alert procedure): Post Debridement Measurements of Total Wound Length: (cm) 1.3 Width: (cm) 0.8 Depth: (cm) 0.2 Volume: (cm) 0.163 Character of Wound/Ulcer Post Debridement: Stable Post Procedure Diagnosis Same as Pre-procedure Electronic Signature(s) Signed: 02/02/2022 4:17:57 PM By: Linton Ham MD Signed: 02/02/2022 5:31:53 PM By: Gretta Cool, BSN, RN, CWS, Kim RN, BSN Signed: 02/04/2022 8:24:32 AM By: Massie Kluver Entered By: Massie Kluver on 02/02/2022 11:23:48 Joyce Evans ([id number]) -------------------------------------------------------------------------------- HPI Details Patient Name: Joyce Evans Date of Service: 02/02/2022 10:30 AM Medical Record Number: [id number] Patient Account Number: [id number] Date of Birth/Sex: Nov 21, 1940 (80 y.o.  F) Treating RN: Cornell Barman Primary Care Provider: Derinda Late Other Clinician: Massie Kluver Referring Provider: Derinda Late Treating Provider/Extender: Tito Dine in Treatment: 2 History of Present Illness HPI Description: 01-16-2022 upon evaluation patient appears for initial evaluation here in the clinic today concerning issues that she has been having with a wound over the anterior portion of her left lower extremity. This is a laceration that occurred on 12-11-2021. She states that she hit this on a car door. Subsequently she sustained a skin tear which has been difficult to heal since that point. No sutures have been placed. She has been using Neosporin and try to leave it open to air which was the recommendation from the physician that she saw. With that being said she tells me that its been difficult to get this to heal and she subsequently ended up with an appointment here in order for [place] to see if there is anything we can recommend to speed up the healing process. She does have long-term use of prednisone due to interstitial lung disease as well as taking fluid pills for lower extremity swelling. Her swelling is not too bad today and to be honest I think it may be better for her to be able to change the dressing more frequently than have a compression wrap on currently. We may need to wrap her though if we do not see the improvement that we really want to see going forward we will keep a close eye on things here. Patient has a history of interstitial lung disease, hypertension, chronic kidney disease stage III, atrial fibrillation, long-term use of anticoagulant therapy, and dependence on supplemental oxygen. She is also on prednisone long-term for the lung disease. 01-26-2022 upon evaluation today patient's wound is actually showing signs of excellent improvement. In just 1 week's time we have seen a dramatic improvement in the overall status of the wound we did remove  the necrotic skin flap and this has started to fill in quite nicely. Fortunately I do not see  any evidence of active infection locally or systemically at this time which is great news. No fevers, chills, nausea, vomiting, or diarrhea. 7/4 initially traumatic wound on her left anterior lower leg. Patient has chronic edema and uses compression stockings. Her wound was caused by a car door injury Electronic Signature(s) Signed: 02/02/2022 4:17:57 PM By: Linton Ham MD Entered By: Linton Ham on 02/02/2022 11:27:36 Joyce Evans ([id number]) -------------------------------------------------------------------------------- Physical Exam Details Patient Name: Joyce Evans Date of Service: 02/02/2022 10:30 AM Medical Record Number: [id number] Patient Account Number: [id number] Date of Birth/Sex: [birth year] (80 y.o. F) Treating RN: Cornell Barman Primary Care Provider: Derinda Late Other Clinician: Massie Kluver Referring Provider: Derinda Late Treating Provider/Extender: Tito Dine in Treatment: 2 Constitutional Patient is hypertensive.. Pulse regular and within target range for patient.Marland Kitchen Respirations regular, non-labored and within target range.Oxygen via [place]. Temperature is normal and within the target range for the patient.Marland Kitchen appears in no distress. Notes Wound exam; peripheral pulses are palpable. She has some degree of nonpitting edema in her left lower leg. The wound is measuring slightly smaller. Very adherent slough which was debrided with a #3 curette. Hemostasis with direct pressure. Subcutaneous tissue removed Electronic Signature(s) Signed: 02/02/2022 4:17:57 PM By: Linton Ham MD Entered By: Linton Ham on 02/02/2022 11:29:23 Joyce Evans ([id number]) -------------------------------------------------------------------------------- Physician Orders Details Patient Name: Joyce Evans Date of Service: 02/02/2022 10:30 AM Medical Record  Number: [id number] Patient Account Number: [id number] Date of Birth/Sex: 01/06/1941 (80 y.o. F) Treating RN: Cornell Barman Primary Care Provider: Derinda Late Other Clinician: Massie Kluver Referring Provider: Derinda Late Treating Provider/Extender: Tito Dine in Treatment: 2 Verbal / Phone Orders: No Diagnosis Coding Follow-up Appointments o Return Appointment in 1 week. Bathing/ Shower/ Hygiene o May shower; gently cleanse wound with antibacterial soap, rinse and pat dry prior to dressing wounds Edema Control - Lymphedema / Segmental Compressive Device / Other o Tubigrip single layer applied. - Tubi D left lower leg o Elevate legs to the level of the heart and pump ankles as often as possible o DO YOUR BEST to sleep in the bed at night. DO NOT sleep in your recliner. Long hours of sitting in a recliner leads to swelling of the legs and/or potential wounds on your backside. Wound Treatment Wound #1 - Lower Leg Wound Laterality: Left, Midline Primary Dressing: Prisma 4.34 (in) 3 x Per Week/30 Days Discharge Instructions: Moisten w/normal saline or sterile water; Cover wound as directed. Do not remove from wound bed. Secondary Dressing: ABD Pad 5x9 (in/in) 3 x Per Week/30 Days Discharge Instructions: Cover with ABD pad Secured With: Conform 4'' - Conforming Stretch Gauze Bandage 4x75 (in/in) 3 x Per Week/30 Days Discharge Instructions: Apply as directed Electronic Signature(s) Signed: 02/02/2022 4:17:57 PM By: Linton Ham MD Signed: 02/04/2022 8:24:32 AM By: Massie Kluver Entered By: Massie Kluver on 02/02/2022 11:25:59 Joyce Evans ([id number]) -------------------------------------------------------------------------------- Problem List Details Patient Name: Joyce Evans Date of Service: 02/02/2022 10:30 AM Medical Record Number: [id number] Patient Account Number: [id number] Date of Birth/Sex: 1941-05-17 (80 y.o. 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/16/2022 No Yes exposed J84.170 Interstitial lung disease with progressive fibrotic  phenotype in diseases 01/16/2022 No Yes classified elsewhere I10 Essential (primary) hypertension 01/16/2022 No Yes N18.30 Chronic kidney disease, stage 3 unspecified 01/16/2022 No Yes I48.0 Paroxysmal atrial fibrillation 01/16/2022 No Yes Z79.01 Long term (current) use of anticoagulants 01/16/2022 No Yes Z99.81 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 F) Treating RN: Cornell Barman Primary Care Provider: Derinda Late Other Clinician: Massie Kluver Referring Provider: Derinda Late Treating Provider/Extender: Tito Dine in Treatment: 2 Subjective History of Present Illness (HPI) 01-16-2022 upon evaluation patient appears for initial evaluation here in the clinic today concerning issues that she has been having with a wound over the anterior portion of her left lower extremity. This is a laceration that occurred on 12-11-2021. She states that she hit this on a car door. Subsequently she sustained a  skin tear which has been difficult to heal since that point. No sutures have been placed. She has been using Neosporin and try to leave it open to air which was the recommendation from the physician that she saw. With that being said she tells me that its been difficult to get this to heal and she subsequently ended up with an appointment here in order for [place] to see if there is anything we can recommend to speed up the healing process. She does have long-term use of prednisone due to interstitial lung disease as well as taking fluid pills for lower extremity swelling. Her swelling is not too bad today and to be honest I think it may be better for her to be able to change the dressing more frequently than have a compression wrap on currently. We may need to wrap her though if we do not see the improvement that we really want to see going forward we will keep a close eye on things here. Patient has a history of interstitial lung disease, hypertension, chronic kidney disease stage III, atrial fibrillation, long-term use of anticoagulant therapy, and dependence on supplemental oxygen. She is also on prednisone long-term for the lung disease. 01-26-2022 upon evaluation today patient's wound is actually showing signs of excellent improvement. In just 1 week's time we have seen a dramatic improvement in the overall status of the wound we did remove the necrotic skin flap and this has started to fill in quite nicely. Fortunately I do not see any evidence of active infection locally or systemically at this time which is great news. No fevers, chills, nausea, vomiting, or diarrhea. 7/4 initially traumatic wound on her left anterior lower leg. Patient has chronic edema and uses compression stockings. Her wound was caused by a car door injury Objective Constitutional Patient is hypertensive.. Pulse regular and within target range for patient.Marland Kitchen Respirations regular, non-labored and within target range.Oxygen  via [place]. Temperature is normal and within the target range for the patient.Marland Kitchen appears in no distress. Vitals Time Taken: 11:05 AM, Height: 60 in, Temperature: 97.8 F, Pulse: 61 bpm, Respiratory Rate: 18 breaths/min, Blood Pressure: 145/74 mmHg. General Notes: Wound exam; peripheral pulses are palpable. She has some degree of nonpitting edema in her left lower leg. The wound is measuring slightly smaller. Very adherent slough which was debrided with a #3 curette. Hemostasis with direct pressure.  Subcutaneous tissue removed Integumentary (Hair, Skin) Wound #1 status is Open. Original cause of wound was Trauma. The date acquired was: 12/11/2021. The wound has been in treatment 2 weeks. The wound is located on the Left,Midline Lower Leg. The wound measures 1.3cm length x 0.8cm width x 0.1cm depth; 0.817cm^2 area and 0.082cm^3 volume. There is Fat Layer (Subcutaneous Tissue) exposed. There is a medium amount of serous drainage noted. The wound margin is distinct with the outline attached to the wound base. There is small (1-33%) pink granulation within the wound bed. There is a large (67-100%) amount of necrotic tissue within the wound bed including Adherent Slough. 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 There was a Excisional Skin/Subcutaneous Tissue Debridement with a total area of 1.04 sq cm performed by Ricard Dillon, MD. With the  following instrument(s): Curette to remove Viable and Non-Viable tissue/material. Material removed includes Subcutaneous Tissue and Slough and. A time out was conducted at 11:22, prior to the start of the procedure. A Minimum amount of bleeding was controlled with Pressure. The procedure was tolerated well. Post Debridement Measurements: 1.3cm length x 0.8cm width x 0.2cm depth; 0.163cm^3 volume. Character of Wound/Ulcer Post Debridement is stable. Post procedure Diagnosis Wound #1: Same as Pre-Procedure Plan Follow-up Appointments: Return Appointment in 1 week. Bathing/ Shower/ Hygiene: May shower; gently cleanse wound with antibacterial soap, rinse and pat dry prior to dressing wounds Edema Control - Lymphedema / Segmental Compressive Device / Other: Tubigrip single layer applied. - Tubi D left lower leg Elevate legs to the level of the heart and pump ankles as often as possible DO YOUR BEST to sleep in the bed at night. DO NOT sleep in your recliner. Long hours of sitting in a recliner leads to swelling of the legs and/or potential wounds on your backside. WOUND #1: - Lower Leg Wound Laterality: Left, Midline Primary Dressing: Prisma 4.34 (in) 3 x Per Week/30 Days Discharge Instructions: Moisten w/normal saline or sterile water; Cover wound as directed. Do not remove from wound bed. Secondary Dressing: ABD Pad 5x9 (in/in) 3 x Per Week/30 Days Discharge Instructions: Cover with ABD pad Secured With: Conform 4'' - Conforming Stretch Gauze Bandage 4x75 (in/in) 3 x Per Week/30 Days Discharge Instructions: Apply as directed 1. We continued with the silver alginate, gauze and Tubigrip. 2. The wound measures slightly smaller today 3. If the wound stalls she may need a more aggressive compression wrap Electronic Signature(s) Signed: 02/02/2022 4:17:57 PM By: Linton Ham MD Entered By: Linton Ham on 02/02/2022 11:30:05 Joyce Evans  ([id number]) -------------------------------------------------------------------------------- SuperBill Details Patient Name: Joyce Evans Date of Service: 02/02/2022 Medical Record Number: [id number] Patient Account Number: [id number] Date of Birth/Sex: 14-Mar-1941 (80 y.o. F) Treating RN: Cornell Barman Primary Care Provider: Derinda Late Other Clinician: Massie Kluver Referring Provider: Derinda Late Treating Provider/Extender: Tito Dine in Treatment: 2 Diagnosis Coding ICD-10 Codes Code Description [phone] Laceration without foreign body, left lower leg, initial encounter L97.822 Non-pressure chronic ulcer of other part of left lower leg with fat layer exposed J84.170 Interstitial  lung disease with progressive fibrotic phenotype in diseases classified elsewhere I10 Essential (primary) hypertension N18.30 Chronic kidney disease, stage 3 unspecified I48.0 Paroxysmal atrial fibrillation Z79.01 Long term (current) use of anticoagulants Z99.81 Dependence on supplemental oxygen Facility Procedures CPT4 Code: [id number] Description: 14431 - DEB SUBQ TISSUE 20 SQ CM/< Modifier: Quantity: 1 CPT4 Code: Description: ICD-10 Diagnosis Description L97.822 Non-pressure chronic ulcer of other part of left lower leg with fat layer S81.812A Laceration without foreign body, left lower leg, initial encounter Modifier: exposed Quantity: Physician Procedures CPT4 Code: [id number] Description: 11042 - WC PHYS SUBQ TISS 20 SQ CM Modifier: Quantity: 1 CPT4 Code: Description: ICD-10 Diagnosis Description L97.822 Non-pressure chronic ulcer of other part of left lower leg with fat layer S81.812A Laceration without foreign body, left lower leg, initial encounter Modifier: exposed Quantity: Electronic Signature(s) Signed: 02/02/2022 4:17:57 PM By: Linton Ham MD Entered By: Linton Ham on 02/02/2022 11:30:17

## 2022-02-09 ENCOUNTER — Encounter: Payer: Medicare PPO | Admitting: Physician Assistant

## 2022-02-09 DIAGNOSIS — S81812A Laceration without foreign body, left lower leg, initial encounter: Secondary | ICD-10-CM | POA: Diagnosis not present

## 2022-02-09 NOTE — Progress Notes (Addendum)
MIESHA, BACHMANN ([id number]) Visit Report for 02/09/2022 Chief Complaint Document Details Patient Name: Joyce Evans, Joyce Evans Date of Service: 02/09/2022 3:00 PM Medical Record Number: [id number] Patient Account Number: [id number] Date of Birth/Sex: Apr 30, 1941 (80 y.o. F) Treating RN: Primary Care Provider: Derinda Late Other Clinician: Referring Provider: Derinda Late Treating Provider/Extender: Skipper Cliche in Treatment: 3 Information Obtained from: Patient Chief Complaint Left LE Ulcer Electronic Signature(s) Signed: 02/09/2022 3:04:33 PM By: Worthy Keeler PA-C Entered By: Worthy Keeler on 02/09/2022 15:04:32 Joyce Evans ([id number]) -------------------------------------------------------------------------------- Debridement Details Patient Name: Joyce Evans Date of Service: 02/09/2022 3:00 PM Medical Record Number: [id number] Patient Account Number: [id number] Date of Birth/Sex: 04/27/1941 (80 y.o. F) Treating RN: Cornell Barman Primary Care Provider: Derinda Late Other Clinician: Referring Provider: Derinda Late Treating Provider/Extender: Skipper Cliche in Treatment: 3 Debridement Performed for Wound #1 Left,Midline Lower Leg Assessment: Performed By: Physician Tommie Sams., PA-C Debridement Type: Debridement Level of Consciousness (Pre- Awake and Alert procedure): Pre-procedure Verification/Time Out Yes - 15:55 Taken: Total Area Debrided (L x W): 0.5 (cm) x 0.6 (cm) = 0.3 (cm) Tissue and other material Viable, Non-Viable, Slough, Subcutaneous, Fibrin/Exudate, Slough debrided: Level: Skin/Subcutaneous Tissue Debridement Description: Excisional Instrument: Curette Bleeding: Minimum Hemostasis Achieved: Pressure Response to Treatment: Procedure was tolerated well Level of Consciousness (Post- Awake and Alert procedure): Post Debridement Measurements of Total Wound Length: (cm) 0.5 Width: (cm) 0.6 Depth: (cm) 0.2 Volume: (cm)  0.047 Character of Wound/Ulcer Post Debridement: Stable Post Procedure Diagnosis Same as Pre-procedure Electronic Signature(s) Signed: 02/09/2022 5:09:07 PM By: Gretta Cool, BSN, RN, CWS, Kim RN, BSN Signed: 02/09/2022 5:40:09 PM By: Worthy Keeler PA-C Entered By: Gretta Cool, BSN, RN, CWS, Kim on 02/09/2022 15:55:46 Joyce Evans ([id number]) -------------------------------------------------------------------------------- HPI Details Patient Name: Joyce Evans Date of Service: 02/09/2022 3:00 PM Medical Record Number: [id number] Patient Account Number: [id number] Date of Birth/Sex: February 16, 1941 (80 y.o. F) Treating RN: Primary Care Provider: Derinda Late Other Clinician: Referring Provider: Derinda Late Treating Provider/Extender: Skipper Cliche in Treatment: 3 History of Present Illness HPI Description: 01-16-2022 upon evaluation patient appears for initial evaluation here in the clinic today concerning issues that she has been having with a wound over the anterior portion of her left lower extremity. This is a laceration that occurred on 12-11-2021. She states that she hit this on a car door. Subsequently she sustained a skin tear which has been difficult to heal since that point. No sutures have been placed. She has been using Neosporin and try to leave it open to air which was the recommendation from the physician that she saw. With that being said she tells me that its been difficult to get this to heal and she subsequently ended up with an appointment here in order for [place] to see if there is anything we can recommend to speed up the healing process. She does have long-term use of prednisone due to interstitial lung disease as well as taking fluid pills for lower extremity swelling. Her swelling is not too bad today and to be honest I think it may be better for her to be able to change the dressing more frequently than have a compression wrap on currently. We may need to wrap her though if  we do not see the improvement that we really want to see going forward we will keep a close eye on things here. Patient has a history of interstitial lung disease, hypertension, chronic kidney disease stage III, atrial fibrillation, long-term use of anticoagulant  therapy, and dependence on supplemental oxygen. She is also on prednisone long-term for the lung disease. 01-26-2022 upon evaluation today patient's wound is actually showing signs of excellent improvement. In just 1 week's time we have seen a dramatic improvement in the overall status of the wound we did remove the necrotic skin flap and this has started to fill in quite nicely. Fortunately I do not see any evidence of active infection locally or systemically at this time which is great news. No fevers, chills, nausea, vomiting, or diarrhea. 7/4 initially traumatic wound on her left anterior lower leg. Patient has chronic edema and uses compression stockings. Her wound was caused by a car door injury 02-09-2022 upon evaluation today patient appears to be doing well currently in regard to her wound although this is definitely very dry the collagen is getting stuck in the wound bed. Fortunately I do not see any evidence of active infection locally or systemically which is great news. No fevers, chills, nausea, vomiting, or diarrhea. Electronic Signature(s) Signed: 02/09/2022 4:02:36 PM By: Worthy Keeler PA-C Entered By: Worthy Keeler on 02/09/2022 16:02:36 Joyce Evans ([id number]) -------------------------------------------------------------------------------- Physical Exam Details Patient Name: Joyce Evans Date of Service: 02/09/2022 3:00 PM Medical Record Number: [id number] Patient Account Number: [id number] Date of Birth/Sex: [birth year] (80 y.o. F) Treating RN: Cornell Barman Primary Care Provider: Derinda Late Other Clinician: Referring Provider: Derinda Late Treating Provider/Extender: Skipper Cliche in Treatment:  3 Constitutional Well-nourished and well-hydrated in no acute distress. Respiratory normal breathing without difficulty. Psychiatric this patient is able to make decisions and demonstrates good insight into disease process. Alert and Oriented x 3. pleasant and cooperative. Notes Upon inspection patient's wound bed actually showed signs of good granulation and epithelization at this point. Fortunately I do not see any evidence of anything worsening and overall I think she is definitely making some good progress here. With that being said the patient does seem to be making improvements slowly but surely. Electronic Signature(s) Signed: 02/09/2022 4:03:17 PM By: Worthy Keeler PA-C Entered By: Worthy Keeler on 02/09/2022 16:03:17 Joyce Evans ([id number]) -------------------------------------------------------------------------------- Physician Orders Details Patient Name: Joyce Evans Date of Service: 02/09/2022 3:00 PM Medical Record Number: [id number] Patient Account Number: [id number] Date of Birth/Sex: 30-Mar-1941 (80 y.o. F) Treating RN: Cornell Barman Primary Care Provider: Derinda Late Other Clinician: Referring Provider: Derinda Late Treating Provider/Extender: Skipper Cliche in Treatment: 3 Verbal / Phone Orders: No Diagnosis Coding ICD-10 Coding Code Description [phone] Laceration without foreign body, left lower leg, initial encounter L97.822 Non-pressure chronic ulcer of other part of left lower leg with fat layer exposed J84.170 Interstitial lung disease with progressive fibrotic phenotype in diseases classified elsewhere I10 Essential (primary) hypertension N18.30 Chronic kidney disease, stage 3 unspecified I48.0 Paroxysmal atrial fibrillation Z79.01 Long term (current) use of anticoagulants Z99.81 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 Bathing/ Shower/ Hygiene o May shower; gently cleanse wound  with antibacterial soap, rinse and pat dry prior to dressing wounds Edema Control - Lymphedema / Segmental Compressive Device / Other o Tubigrip single layer applied. - Tubi D left lower leg o Elevate legs to the level of the heart and pump ankles as often as possible o DO YOUR BEST to sleep in the bed at night. DO NOT sleep in your recliner. Long hours of sitting in a recliner leads to swelling of the legs and/or potential wounds on your backside. Wound Treatment Wound #1 - Lower Leg Wound  Laterality: Left, Midline Primary Dressing: Xeroform-HBD 2x2 (in/in) 3 x Per Week/30 Days Discharge Instructions: Apply Xeroform-HBD 2x2 (in/in) as directed Secondary Dressing: ABD Pad 5x9 (in/in) 3 x Per Week/30 Days Discharge Instructions: Cover with ABD pad Secured With: Conform 4'' - Conforming Stretch Gauze Bandage 4x75 (in/in) 3 x Per Week/30 Days Discharge Instructions: Apply as directed Electronic Signature(s) Signed: 02/09/2022 5:09:07 PM By: Gretta Cool, BSN, RN, CWS, Kim RN, BSN Signed: 02/09/2022 5:40:09 PM By: Worthy Keeler PA-C Entered By: Gretta Cool, BSN, RN, CWS, Kim on 02/09/2022 15:56:25 Joyce Evans ([id number]) -------------------------------------------------------------------------------- Problem List Details Patient Name: Joyce Evans Date of Service: 02/09/2022 3:00 PM Medical Record Number: [id number] Patient Account Number: [id number] Date of Birth/Sex: 01-07-1941 (80 y.o. F) Treating RN: Primary Care Provider: Derinda Late Other Clinician: Referring Provider: Derinda Late Treating Provider/Extender: Skipper Cliche in Treatment: 3 Active Problems ICD-10 Encounter Code Description Active Date MDM Diagnosis S81.812A Laceration without foreign body, left lower leg, initial encounter 01/16/2022 No Yes L97.822 Non-pressure chronic ulcer of other part of left lower leg with fat layer 01/16/2022 No Yes exposed J84.170 Interstitial lung disease with progressive fibrotic  phenotype in diseases 01/16/2022 No Yes classified elsewhere I10 Essential (primary) hypertension 01/16/2022 No Yes N18.30 Chronic kidney disease, stage 3 unspecified 01/16/2022 No Yes I48.0 Paroxysmal atrial fibrillation 01/16/2022 No Yes Z79.01 Long term (current) use of anticoagulants 01/16/2022 No Yes Z99.81 Dependence on supplemental oxygen 01/16/2022 No Yes Inactive Problems Resolved Problems Electronic Signature(s) Signed: 02/09/2022 3:04:28 PM By: Worthy Keeler PA-C Entered By: Worthy Keeler on 02/09/2022 15:04:28 Messler, Candie Echevaria ([id number]) -------------------------------------------------------------------------------- Progress Note Details Patient Name: Joyce Evans Date of Service: 02/09/2022 3:00 PM Medical Record Number: [id number] Patient Account Number: [id number] Date of Birth/Sex: 03-13-1941 (80 y.o. F) Treating RN: Cornell Barman Primary Care Provider: Derinda Late Other Clinician: Referring Provider: Derinda Late Treating Provider/Extender: Skipper Cliche in Treatment: 3 Subjective Chief Complaint Information obtained from Patient Left LE Ulcer History of Present Illness (HPI) 01-16-2022 upon evaluation patient appears for initial evaluation here in the clinic today concerning issues that she has been having with a wound over the anterior portion of her left lower extremity. This is a laceration that occurred on 12-11-2021. She states that she hit this on a car door. Subsequently she sustained a skin tear which has been difficult to heal since that point. No sutures have been placed. She has been using Neosporin and try to leave it open to air which was the recommendation from the physician that she saw. With that being said she tells me that its been difficult to get this to heal and she subsequently ended up with an appointment here in order for [place] to see if there is anything we can recommend to speed up the healing process. She does have long-term use of prednisone  due to interstitial lung disease as well as taking fluid pills for lower extremity swelling. Her swelling is not too bad today and to be honest I think it may be better for her to be able to change the dressing more frequently than have a compression wrap on currently. We may need to wrap her though if we do not see the improvement that we really want to see going forward we will keep a close eye on things here. Patient has a history of interstitial lung disease, hypertension, chronic kidney disease stage III, atrial fibrillation, long-term use of anticoagulant therapy, and dependence on supplemental oxygen. She is also on prednisone long-term for  the lung disease. 01-26-2022 upon evaluation today patient's wound is actually showing signs of excellent improvement. In just 1 week's time we have seen a dramatic improvement in the overall status of the wound we did remove the necrotic skin flap and this has started to fill in quite nicely. Fortunately I do not see any evidence of active infection locally or systemically at this time which is great news. No fevers, chills, nausea, vomiting, or diarrhea. 7/4 initially traumatic wound on her left anterior lower leg. Patient has chronic edema and uses compression stockings. Her wound was caused by a car door injury 02-09-2022 upon evaluation today patient appears to be doing well currently in regard to her wound although this is definitely very dry the collagen is getting stuck in the wound bed. Fortunately I do not see any evidence of active infection locally or systemically which is great news. No fevers, chills, nausea, vomiting, or diarrhea. Objective Constitutional Well-nourished and well-hydrated in no acute distress. Vitals Time Taken: 3:43 PM, Height: 60 in, Temperature: 98.0 F, Pulse: 59 bpm, Respiratory Rate: 18 breaths/min, Blood Pressure: 122/68 mmHg. Respiratory normal breathing without difficulty. Psychiatric this patient is able to  make decisions and demonstrates good insight into disease process. Alert and Oriented x 3. pleasant and cooperative. General Notes: Upon inspection patient's wound bed actually showed signs of good granulation and epithelization at this point. Fortunately I do not see any evidence of anything worsening and overall I think she is definitely making some good progress here. With that being said the patient does seem to be making improvements slowly but surely. Integumentary (Hair, Skin) Wound #1 status is Open. Original cause of wound was Trauma. The date acquired was: 12/11/2021. The wound has been in treatment 3 weeks. The wound is located on the Left,Midline Lower Leg. The wound measures 0.5cm length x 0.6cm width x 0.2cm depth; 0.236cm^2 area and 0.047cm^3 volume. There is Fat Layer (Subcutaneous Tissue) exposed. There is no tunneling or undermining noted. There is a medium amount of serous drainage noted. The wound margin is distinct with the outline attached to the wound base. There is small (1-33%) pink granulation Copeman, Saffron P. ([id number]) within the wound bed. There is a large (67-100%) amount of necrotic tissue within the wound bed including Adherent Slough. 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 There was a Excisional Skin/Subcutaneous Tissue Debridement with a total area of 0.3 sq cm performed by Tommie Sams., PA-C. With the following instrument(s):  Curette to remove Viable and Non-Viable tissue/material. Material removed includes Subcutaneous Tissue, Slough, and Fibrin/Exudate. No specimens were taken. A time out was conducted at 15:55, prior to the start of the procedure. A Minimum amount of bleeding was controlled with Pressure. The procedure was tolerated well. Post Debridement Measurements: 0.5cm length x 0.6cm width x 0.2cm depth; 0.047cm^3 volume. Character of Wound/Ulcer Post Debridement is stable. Post procedure Diagnosis Wound #1: Same as Pre-Procedure Plan Follow-up Appointments: Return Appointment in 1 week. Bathing/ Shower/ Hygiene: May shower; gently cleanse wound with antibacterial soap, rinse and pat dry prior to dressing wounds Edema  Control - Lymphedema / Segmental Compressive Device / Other: Tubigrip single layer applied. - Tubi D left lower leg Elevate legs to the level of the heart and pump ankles as often as possible DO YOUR BEST to sleep in the bed at night. DO NOT sleep in your recliner. Long hours of sitting in a recliner leads to swelling of the legs and/or potential wounds on your backside. WOUND #1: - Lower Leg Wound Laterality: Left, Midline Primary Dressing: Xeroform-HBD 2x2 (in/in) 3 x Per Week/30 Days Discharge Instructions: Apply Xeroform-HBD 2x2 (in/in) as directed Secondary Dressing: ABD Pad 5x9 (in/in) 3 x Per Week/30 Days Discharge Instructions: Cover with ABD pad Secured With: Conform 4'' - Conforming Stretch Gauze Bandage 4x75 (in/in) 3 x Per Week/30 Days Discharge Instructions: Apply as directed 1. Would recommend currently we switch over to using a Xeroform gauze dressing I think this is good to be better since she is sticking with even the collagen I think this is going to help her to get the final little bit of this healed more appropriately and quickly. 2. I am also can recommend that we keep with the ABD pad to cover followed by roll gauze to secure in place. 3. We will also continue with  the Tubigrip size D which seems to be doing a great job. We will see patient back for reevaluation in 1 week here in the clinic. If anything worsens or changes patient will contact our office for additional recommendations. Electronic Signature(s) Signed: 02/09/2022 4:03:47 PM By: Warren Lacy, Elliana P. ([id number]) Entered By: Worthy Keeler on 02/09/2022 16:03:47 CARRERA, KIESEL ([id number]) -------------------------------------------------------------------------------- SuperBill Details Patient Name: Joyce Evans Date of Service: 02/09/2022 Medical Record Number: [id number] Patient Account Number: [id number] Date of Birth/Sex: 1940-08-17 (80 y.o. F) Treating RN: Cornell Barman Primary Care Provider: Derinda Late Other Clinician: Referring Provider: Derinda Late Treating Provider/Extender: Skipper Cliche in Treatment: 3 Diagnosis Coding ICD-10 Codes Code Description [phone] Laceration without foreign body, left lower leg, initial encounter L97.822 Non-pressure chronic ulcer of other part of left lower leg with fat layer exposed J84.170 Interstitial lung disease with progressive fibrotic phenotype in diseases classified elsewhere I10 Essential (primary) hypertension N18.30 Chronic kidney disease, stage 3 unspecified I48.0 Paroxysmal atrial fibrillation Z79.01 Long term (current) use of anticoagulants Z99.81 Dependence on supplemental oxygen Facility Procedures CPT4 Code: [id number] Description: 11042 - DEB SUBQ TISSUE 20 SQ CM/< Modifier: Quantity: 1 CPT4 Code: Description: ICD-10 Diagnosis Description L97.822 Non-pressure chronic ulcer of other part of left lower leg with fat layer Modifier: exposed Quantity: Physician Procedures CPT4 Code: [id number] Description: 11042 - WC PHYS SUBQ TISS 20 SQ CM Modifier: Quantity: 1 CPT4 Code: Description: ICD-10 Diagnosis Description L97.822 Non-pressure chronic ulcer of other part of left lower leg with fat  layer Modifier: exposed Quantity: Electronic Signature(s) Signed: 02/09/2022 4:05:56 PM By: Worthy Keeler PA-C Entered By: Worthy Keeler on 02/09/2022 16:05:55

## 2022-02-09 NOTE — Progress Notes (Addendum)
Joyce, Evans ([id number]) Visit Report for 02/09/2022 Arrival Information Details Patient Name: Joyce Evans, Joyce Evans Date of Service: 02/09/2022 3:00 PM Medical Record Number: [id number] Patient Account Number: [id number] Date of Birth/Sex: 07-20-1940 (80 y.o. F) Treating RN: Cornell Barman Primary Care Harvel Meskill: Derinda Late Other Clinician: Referring Korinne Greenstein: Derinda Late Treating Earla Charlie/Extender: Skipper Cliche in Treatment: 3 Visit Information History Since Last Visit Added or deleted any medications: No Patient Arrived: Wheel Chair Has Dressing in Place as Prescribed: Yes Arrival Time: 15:35 Has Compression in Place as Prescribed: No Accompanied By: husband Pain Present Now: No Transfer Assistance: Manual Patient Identification Verified: Yes Secondary Verification Process Completed: Yes Patient Requires Transmission-Based No Precautions: Patient Has Alerts: Yes Patient Alerts: Patient on Blood Thinner Eliquis NOT DIABETIC Electronic Signature(s) Signed: 02/09/2022 5:09:07 PM By: Gretta Cool, BSN, RN, CWS, Kim RN, BSN Entered By: Gretta Cool, BSN, RN, CWS, Kim on 02/09/2022 15:43:41 Joyce Evans ([id number]) -------------------------------------------------------------------------------- Encounter Discharge Information Details Patient Name: Joyce Evans Date of Service: 02/09/2022 3:00 PM Medical Record Number: [id number] Patient Account Number: [id number] Date of Birth/Sex: [birth year] (80 y.o. F) Treating RN: Cornell Barman Primary Care Erendida Wrenn: Derinda Late Other Clinician: Referring Tracee Mccreery: Derinda Late Treating Mikea Quadros/Extender: Skipper Cliche in Treatment: 3 Encounter Discharge Information Items Post Procedure Vitals Discharge Condition: Stable Temperature (F): 97.8 Ambulatory Status: Wheelchair Pulse (bpm): 61 Discharge Destination: Home Respiratory Rate (breaths/min): 16 Transportation: Private Auto Blood Pressure (mmHg): 145/74 Accompanied By:  husband Schedule Follow-up Appointment: No Clinical Summary of Care: Electronic Signature(s) Signed: 02/09/2022 5:09:07 PM By: Gretta Cool, BSN, RN, CWS, Kim RN, BSN Entered By: Gretta Cool, BSN, RN, CWS, Kim on 02/09/2022 16:06:39 Joyce Evans ([id number]) -------------------------------------------------------------------------------- Lower Extremity Assessment Details Patient Name: Joyce Evans Date of Service: 02/09/2022 3:00 PM Medical Record Number: [id number] Patient Account Number: [id number] Date of Birth/Sex: 1940/11/21 (80 y.o. F) Treating RN: Cornell Barman Primary Care Joziyah Roblero: Derinda Late Other Clinician: Referring Giovanni Bath: Derinda Late Treating Ivylynn Hoppes/Extender: Skipper Cliche in Treatment: 3 Edema Assessment Assessed: [Left: No] [Right: No] [Left: Edema] [Right: :] Calf Left: Right: Point of Measurement: 29 cm From Medial Instep 39.8 cm Ankle Left: Right: Point of Measurement: 8 cm From Medial Instep 22 cm Vascular Assessment Pulses: Dorsalis Pedis Palpable: [Left:Yes] Electronic Signature(s) Signed: 02/09/2022 5:09:07 PM By: Gretta Cool, BSN, RN, CWS, Kim RN, BSN Entered By: Gretta Cool, BSN, RN, CWS, Kim on 02/09/2022 15:47:11 Joyce Evans ([id number]) -------------------------------------------------------------------------------- Multi Wound Chart Details Patient Name: Joyce Evans Date of Service: 02/09/2022 3:00 PM Medical Record Number: [id number] Patient Account Number: [id number] Date of Birth/Sex: Jan 16, 1941 (80 y.o. F) Treating RN: Cornell Barman Primary Care Esterlene Atiyeh: Derinda Late Other Clinician: Referring Lonney Revak: Derinda Late Treating Burkley Dech/Extender: Skipper Cliche in Treatment: 3 Vital Signs Height(in): 60 Pulse(bpm): 107 Weight(lbs): Blood Pressure(mmHg): 122/68 Body Mass Index(BMI): Temperature(F): 98.0 Respiratory Rate(breaths/min): 18 Photos: [N/A:N/A] Wound Location: Left, Midline Lower Leg N/A N/A Wounding Event: Trauma  N/A N/A Primary Etiology: Trauma, Other N/A N/A Comorbid History: Glaucoma, Arrhythmia, N/A N/A Hypertension, End Stage Renal Disease Date Acquired: 12/11/2021 N/A N/A Weeks of Treatment: 3 N/A N/A Wound Status: Open N/A N/A Wound Recurrence: No N/A N/A Measurements L x W x D (cm) 0.5x0.6x0.2 N/A N/A Area (cm) : 0.236 N/A N/A Volume (cm) : 0.047 N/A N/A % Reduction in Area: 88.90% N/A N/A % Reduction in Volume: 77.80% N/A N/A Classification: Full Thickness Without Exposed N/A N/A Support Structures Exudate Amount: Medium N/A N/A Exudate Type: Serous N/A N/A Exudate Color: amber N/A N/A Wound Margin: Distinct,  outline attached N/A N/A Granulation Amount: Small (1-33%) N/A N/A Granulation Quality: Pink N/A N/A Necrotic Amount: Large (67-100%) N/A N/A Exposed Structures: Fat Layer (Subcutaneous Tissue): N/A N/A Yes Fascia: No Tendon: No Muscle: No Joint: No Bone: No Epithelialization: Small (1-33%) N/A N/A Treatment Notes Electronic Signature(s) Signed: 02/09/2022 5:09:07 PM By: Gretta Cool, BSN, RN, CWS, Kim RN, BSN Entered By: Gretta Cool, BSN, RN, CWS, Kim on 02/09/2022 15:54:07 Joyce Evans ([id number]) MONASIA, LAIR ([id number]) -------------------------------------------------------------------------------- Joyce Evans Details Patient Name: Joyce Evans Date of Service: 02/09/2022 3:00 PM Medical Record Number: [id number] Patient Account Number: [id number] Date of Birth/Sex: [birth year] (80 y.o. F) Treating RN: Cornell Barman Primary Care Cornelius Schuitema: Derinda Late Other Clinician: Referring Ramiz Turpin: Derinda Late Treating Bishoy Cupp/Extender: Skipper Cliche in Treatment: 3 Active Inactive Abuse / Safety / Falls / Self Care Management Nursing Diagnoses: Potential for falls Goals: Patient/caregiver will verbalize understanding of skin care regimen Date Initiated: 01/16/2022 Target Resolution Date: 01/16/2022 Goal Status: Active Interventions: Podiatry  chair, stretcher in low position and side rails up as needed Notes: Necrotic Tissue Nursing Diagnoses: Impaired tissue integrity related to necrotic/devitalized tissue Knowledge deficit related to management of necrotic/devitalized tissue Goals: Necrotic/devitalized tissue will be minimized in the wound bed Date Initiated: 01/16/2022 Target Resolution Date: 01/16/2022 Goal Status: Active Patient/caregiver will verbalize understanding of reason and process for debridement of necrotic tissue Date Initiated: 01/16/2022 Target Resolution Date: 01/16/2022 Goal Status: Active Interventions: Assess patient pain level pre-, during and post procedure and prior to discharge Provide education on necrotic tissue and debridement process Treatment Activities: Excisional debridement : 01/16/2022 Notes: Orientation to the Wound Care Program Nursing Diagnoses: Knowledge deficit related to the wound healing center program Goals: Patient/caregiver will verbalize understanding of the [place] [facility] Date Initiated: 01/16/2022 Target Resolution Date: 01/16/2022 Goal Status: Active Interventions: Provide education on orientation to the wound center Notes: Wound/Skin Impairment Deerman, Chrystel P. ([id number]) Nursing Diagnoses: Knowledge deficit related to smoking impact on wound healing Goals: Patient/caregiver will verbalize understanding of skin care regimen Date Initiated: 01/16/2022 Target Resolution Date: 01/16/2022 Goal Status: Active Ulcer/skin breakdown will have a volume reduction of 30% by week 4 Date Initiated: 01/16/2022 Target Resolution Date: 02/13/2022 Goal Status: Active Interventions: Assess patient/caregiver ability to obtain necessary supplies Assess ulceration(s) every visit Treatment Activities: Skin care regimen initiated : 01/16/2022 Topical wound management initiated : 01/16/2022 Notes: Electronic Signature(s) Signed: 02/09/2022 5:09:07 PM By: Gretta Cool, BSN, RN, CWS, Kim RN,  BSN Entered By: Gretta Cool, BSN, RN, CWS, Kim on 02/09/2022 15:47:19 Joyce Evans ([id number]) -------------------------------------------------------------------------------- Pain Assessment Details Patient Name: Joyce Evans Date of Service: 02/09/2022 3:00 PM Medical Record Number: [id number] Patient Account Number: [id number] Date of Birth/Sex: 25-Jul-1940 (80 y.o. F) Treating RN: Cornell Barman Primary Care Jadon Ressler: Derinda Late Other Clinician: Referring Chadd Tollison: Derinda Late Treating Russel Morain/Extender: Skipper Cliche in Treatment: 3 Active Problems Location of Pain Severity and Description of Pain Patient Has Paino No Site Locations Pain Management and Medication Current Pain Management: Electronic Signature(s) Signed: 02/09/2022 5:09:07 PM By: Gretta Cool, BSN, RN, CWS, Kim RN, BSN Entered By: Gretta Cool, BSN, RN, CWS, Kim on 02/09/2022 15:44:04 Joyce Evans ([id number]) -------------------------------------------------------------------------------- Patient/Caregiver Education Details Patient Name: Joyce Evans Date of Service: 02/09/2022 3:00 PM Medical Record Number: [id number] Patient Account Number: [id number] Date of Birth/Gender: 1941-03-24 (80 y.o. F) Treating RN: Cornell Barman Primary Care Physician: Derinda Late Other Clinician: Referring Physician: Derinda Late Treating Physician/Extender: Skipper Cliche in Treatment: 3 Education Assessment Education Provided To: Patient Education  Topics Provided Wound Debridement: Handouts: Wound Debridement Methods: Demonstration, Explain/Verbal Responses: State content correctly Electronic Signature(s) Signed: 02/09/2022 5:09:07 PM By: Gretta Cool, BSN, RN, CWS, Kim RN, BSN Entered By: Gretta Cool, BSN, RN, CWS, Kim on 02/09/2022 16:05:51 Joyce Evans ([id number]) -------------------------------------------------------------------------------- Wound Assessment Details Patient Name: Joyce Evans Date of Service:  02/09/2022 3:00 PM Medical Record Number: [id number] Patient Account Number: [id number] Date of Birth/Sex: 04/11/1941 (80 y.o. F) Treating RN: Cornell Barman Primary Care Manar Smalling: Derinda Late Other Clinician: Referring Caylor Cerino: Derinda Late Treating Kapono Luhn/Extender: Skipper Cliche in Treatment: 3 Wound Status Wound Number: 1 Primary Trauma, Other Etiology: Wound Location: Left, Midline Lower Leg Wound Status: Open Wounding Event: Trauma Comorbid Glaucoma, Arrhythmia, Hypertension, End Stage Renal Date Acquired: 12/11/2021 History: Disease Weeks Of Treatment: 3 Clustered Wound: No Photos Wound Measurements Length: (cm) 0.5 Width: (cm) 0.6 Depth: (cm) 0.2 Area: (cm) 0.236 Volume: (cm) 0.047 % Reduction in Area: 88.9% % Reduction in Volume: 77.8% Epithelialization: Small (1-33%) Tunneling: No Undermining: No Wound Description Classification: Full Thickness Without Exposed Support Structu Wound Margin: Distinct, outline attached Exudate Amount: Medium Exudate Type: Serous Exudate Color: amber res Foul Odor After Cleansing: No Slough/Fibrino Yes Wound Bed Granulation Amount: Small (1-33%) Exposed Structure Granulation Quality: Pink Fascia Exposed: No Necrotic Amount: Large (67-100%) Fat Layer (Subcutaneous Tissue) Exposed: Yes Necrotic Quality: Adherent Slough Tendon Exposed: No Muscle Exposed: No Joint Exposed: No Bone Exposed: No Treatment Notes Wound #1 (Lower Leg) Wound Laterality: Left, Midline Cleanser Peri-Wound Care Topical Watts, Anye P. ([id number]) Primary Dressing Xeroform-HBD 2x2 (in/in) Discharge Instruction: Apply Xeroform-HBD 2x2 (in/in) as directed Secondary Dressing ABD Pad 5x9 (in/in) Discharge Instruction: Cover with ABD pad Secured With Conform 4'' - Conforming Stretch Gauze Bandage 4x75 (in/in) Discharge Instruction: Apply as directed Compression Wrap Compression Stockings Add-Ons Electronic Signature(s) Signed: 02/09/2022  5:09:07 PM By: Gretta Cool, BSN, RN, CWS, Kim RN, BSN Entered By: Gretta Cool, BSN, RN, CWS, Kim on 02/09/2022 15:53:49 Joyce Evans ([id number]) -------------------------------------------------------------------------------- Vitals Details Patient Name: Joyce Evans Date of Service: 02/09/2022 3:00 PM Medical Record Number: [id number] Patient Account Number: [id number] Date of Birth/Sex: 18-Aug-1940 (80 y.o. F) Treating RN: Cornell Barman Primary Care Tashunda Vandezande: Derinda Late Other Clinician: Referring Jazir Newey: Derinda Late Treating Jacksyn Beeks/Extender: Skipper Cliche in Treatment: 3 Vital Signs Time Taken: 15:43 Temperature (F): 98.0 Height (in): 60 Pulse (bpm): 59 Respiratory Rate (breaths/min): 18 Blood Pressure (mmHg): 122/68 Reference Range: 80 - 120 mg / dl Electronic Signature(s) Signed: 02/09/2022 5:09:07 PM By: Gretta Cool, BSN, RN, CWS, Kim RN, BSN Entered By: Gretta Cool, BSN, RN, CWS, Kim on 02/09/2022 15:44:00

## 2022-02-10 ENCOUNTER — Ambulatory Visit: Payer: Medicare PPO | Attending: Orthopedic Surgery

## 2022-02-10 DIAGNOSIS — M5459 Other low back pain: Secondary | ICD-10-CM | POA: Diagnosis present

## 2022-02-10 DIAGNOSIS — Z9181 History of falling: Secondary | ICD-10-CM

## 2022-02-10 DIAGNOSIS — M5417 Radiculopathy, lumbosacral region: Secondary | ICD-10-CM | POA: Diagnosis present

## 2022-02-10 DIAGNOSIS — M6281 Muscle weakness (generalized): Secondary | ICD-10-CM | POA: Diagnosis present

## 2022-02-10 DIAGNOSIS — M5431 Sciatica, right side: Secondary | ICD-10-CM

## 2022-02-10 DIAGNOSIS — R262 Difficulty in walking, not elsewhere classified: Secondary | ICD-10-CM

## 2022-02-10 NOTE — Therapy (Signed)
OUTPATIENT PHYSICAL THERAPY TREATMENT NOTE   Patient Name: Joyce Evans MRN: [id number] DOB:Jul 08, 1941, 81 y.o., female Today's Date: 02/10/2022  PCP: Derinda Late, MD  REFERRING PROVIDER: Phylliss Bob, MD   PT End of Session - 02/10/22 0848     Number of Visits 17    Date for PT Re-Evaluation 03/12/22    Authorization Type [employer]    Authorization Time Period cert through March 12, 2022    Progress Note Due on Visit 10    PT Start Time [phone]    PT Stop Time 0930    PT Time Calculation (min) 41 min    Activity Tolerance Patient tolerated treatment well;No increased pain    Behavior During Therapy [facility] for tasks assessed/performed                Past Medical History:  Diagnosis Date   Arthritis    osteo - shoulders, fingers   Atrial fibrillation ([place])    Dizziness    thinks because of diuretic   Family history of adverse reaction to anesthesia    daughter -PONV   Gallstones 2016, 2017   GERD (gastroesophageal reflux disease)    Glaucoma    Heart murmur    history of   Hypertension    Melanoma ([place]) 04/20/2016   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 Excised: 05/19/2016, margins free.   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   Tricuspid valve disorder    leak   Past Surgical History:  Procedure Laterality Date   ABDOMINAL HYSTERECTOMY  1987   partial   CARDIAC CATHETERIZATION  2010   [facility]   CATARACT EXTRACTION W/PHACO Right 08/28/2015   Procedure: CATARACT EXTRACTION PHACO AND INTRAOCULAR LENS PLACEMENT ([place]);  Surgeon: Leandrew Koyanagi, MD;  Location: [place];  Service: Ophthalmology;  Laterality: Right;  TORIC   INTRAMEDULLARY (IM) NAIL INTERTROCHANTERIC Right 10/16/2021   Procedure: INTRAMEDULLARY (IM) NAIL INTERTROCHANTRIC;  Surgeon: Renee Harder, MD;  Location: [facility]  ORS;  Service: Orthopedics;  Laterality: Right;   MELANOMA EXCISION Left 05/19/2016   MIS. Left mid. lat. tricep. 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 S/P IM nail placement for right intertrochanteric hip fracture with Dr. Sharlet Salina on 10/16/2021. Pt slipped on the kithen floor. Pt was getting something from the microwave and pt tripped on her O2 tube. Pt was independent with ambulation prior to her fall. Pt fell before in the woods and pt tripped over a root 3 years ago (no injuries per pt). Pt has been using supplemental O2 for the past 2 years in November. Pt has interstitial lung disease (idiopathic pulmonary fibrosis). Recent breathing test is stable. Back pain began when pt was in Rehab in [place] [facility]. Feels like it is sciatical. Pt  radiates along the sciatic nerve and R L 5 dermatome. Pt is healing fine as far as her R hip surgery is concerned. Also has R medial knee pain due to arthritis. Had home health PT which involves seated knee extension, hip flexion, standing hip abduction, extension. Pt did some walking wiht a rw. Feels a lot of pain all over. Walking too far is limited due to her O2 situation.  PRECAUTIONS: SpO2 not under 90 %  SUBJECTIVE: Has been doing her exercises at home and feels stronger. Does the recumbent bike as well, did 30 minutes 2 days ago.     PAIN:  Are you having pain? 0/10 currently     TODAY'S TREATMENT:   Therapeutic exercise  98%  SpO2, 3 L  Seated with back unsupported    With B scapular retraction and transversus abdominis contraction:     Hip extension isometrics   R 10x5 seconds then 5x5 seconds   L 10x5 seconds then 5x5 seconds    96% SpO2    Standing with B UE assist  Hip abduction    R 10x   L 10x    92-96% SpO2 (on 3 L O2),    Then Hip abduction after rest   R 10x   L 10x    92 % to 94% SpO2 3 L    Then Hip abduction after rest   R 10x   L 10x      88%  SpO2 3 L, increased to 4 L then SpO2 increased to 97%   Sitting with upright posture  Manual trunk perturbation, all planes from PT 30 seconds x 3  Sit <> stand from wc with red band around knees, to resist hip abduction/ER, B UE assist 5x  96% SpO2 4 L  Then with no UE assist to sit 5x2   87% to 93% afterwards, then to 97% after rest.    Side stepping 5 ft to the L and 5 ft to the R with B UE assist to promote glute med muscle strengthening  86 % SpO2, 4 L, then 92% to 94% after rest    Improved exercise technique, movement at target joints, use of target muscles after mod verbal, visual, tactile cues.      Response to treatment  Pt tolerated session well without aggravation of symptoms.      Clinical impression Some improvement in oxygenation observed, being able to perform more standing hip abduction exercise on 3 L prior to going up to 4 L. Improved oxygenation observed as well when pt was cued for upright posture, B scapular retraction, and slight cervical retraction to improve surface area for her lunges to receive oxygen. Weak R glute med and trunk strength in closed chain observed when performing standing L hip abduction exercise. Continued working on trunk and glute strengthening as well  as thoracic extension to decrease stress to low back. Pt tolerated session well without aggravation of symptoms.  Pt will benefit from continued skilled physical therapy services to decrease pain, improve strength, gait, and function.       PATIENT EDUCATION: Education details: ther-ex, HEP Person educated: Patient Education method: Explanation, Demonstration, Tactile cues, Verbal cues, and Handouts Education comprehension: verbalized understanding and returned demonstration   HOME EXERCISE PROGRAM: Access Code: [email or web address] URL: [email or web address] Date: 01/20/2022 Prepared by: Joneen Boers  Exercises - Seated Transversus Abdominis Bracing  - 1 x daily - 7 x weekly - 3 sets - 10 reps - 5 seconds hold - Seated  Gluteal Sets  - 1 x daily - 7 x weekly - 3 sets - 10 reps - 5 seconds hold - Seated Scapular Retraction  - 1 x daily - 7 x weekly - 3 sets - 10 reps - 5 seconds  hold  - Seated Hip Abduction with Resistance  - 1 x daily - 7 x weekly - 2 sets - 10 reps - Seated Hip Adduction Isometrics with Ball  - 1 x daily - 7 x weekly - 2 sets - 10 reps - 5 seconds hold     PT Short Term Goals - 01/12/22 0912       PT SHORT TERM GOAL #1   Title Pt will be independent with her initial HEP to decrease pain, improve strength, function, and ability to stand and ambulate more comfortably.    Time 3    Period Weeks    Status New    Target Date 02/05/22              PT Long Term Goals - 01/12/22 0913       PT LONG TERM GOAL #1   Title Pt will have a decrease in low back pain to 3/10 or less at worst to promote ability to stand and ambulate with less difficulty.    Baseline 6/10 low back pain at worst for the past 2 months (01/12/2022)    Time 8    Period Weeks    Status New    Target Date 03/12/22      PT LONG TERM GOAL #2   Title Pt will have a decrease in R LE pain to 3/10 or less at worst to promote ability to stand and ambulate will less difficulty.    Baseline 9/10 R LE pain at worst (01/12/2022)    Time 8    Period Weeks    Status New    Target Date 03/12/22      PT LONG TERM GOAL #3   Title Pt will improve her FOTO score by at least 10 points as a demontsration of improved function.     Baseline Lumbar Spine FOTO 39 (01/12/2022)    Time 8    Period Weeks    Status New    Target Date 03/12/22      PT LONG TERM GOAL #4   Title Pt will be able to ambulate at least 100 ft independently to promote mobility and return to prior level of function.    Baseline Able to ambulate with rw short distance, about 40 ft with reproduction of pain. (01/12/2022)    Time 8    Period Weeks    Status New    Target Date 03/12/22              Plan - 02/10/22 1034     Clinical Impression Statement Some improvement in oxygenation observed, being able to perform more standing hip abduction exercise on 3 L prior to going up to 4 L. Improved oxygenation observed as well when pt was cued for upright posture, B scapular retraction, and slight cervical retraction to improve surface area for her lunges to receive oxygen. Weak R glute med and trunk strength in closed chain observed when performing standing L hip abduction exercise. Continued working on trunk and glute strengthening as well  as thoracic extension to decrease stress to low back. Pt tolerated session well without aggravation of symptoms.  Pt will benefit from continued skilled physical therapy services to decrease pain, improve strength, gait,  and function.    Personal Factors and Comorbidities Comorbidity 3+;Age;Time since onset of injury/illness/exacerbation;Fitness    Comorbidities Arthritis, atrial fibrillation, HTN, idiopathic pulmonary fibrosis    Examination-Activity Limitations Stairs;Bed Mobility;Stand;Sit;Lift;Transfers;Locomotion Level;Carry    Stability/Clinical Decision Making Stable/Uncomplicated    Rehab Potential Fair    PT Frequency 2x / week    PT Duration 8 weeks    PT Treatment/Interventions Therapeutic activities;Therapeutic exercise;Gait training;Balance training;Neuromuscular re-education;Patient/family education;Manual techniques;Dry needling;Electrical Stimulation;Iontophoresis '4mg'$ /ml Dexamethasone    PT Next Visit  Plan Posture, thoracic extension, trunk and glute strength, lumbopelvic control during gait, manual techniques, modalities PRN    Consulted and Agree with Plan of Care Patient                 Joneen Boers PT, DPT  02/10/2022, 10:35 AM

## 2022-02-12 ENCOUNTER — Ambulatory Visit: Payer: Medicare PPO

## 2022-02-12 DIAGNOSIS — R262 Difficulty in walking, not elsewhere classified: Secondary | ICD-10-CM

## 2022-02-12 DIAGNOSIS — M5417 Radiculopathy, lumbosacral region: Secondary | ICD-10-CM

## 2022-02-12 DIAGNOSIS — M6281 Muscle weakness (generalized): Secondary | ICD-10-CM

## 2022-02-12 DIAGNOSIS — M5431 Sciatica, right side: Secondary | ICD-10-CM

## 2022-02-12 DIAGNOSIS — M5459 Other low back pain: Secondary | ICD-10-CM | POA: Diagnosis not present

## 2022-02-12 DIAGNOSIS — Z9181 History of falling: Secondary | ICD-10-CM

## 2022-02-12 NOTE — Therapy (Signed)
OUTPATIENT PHYSICAL THERAPY TREATMENT NOTE   Patient Name: Joyce Evans MRN: [id number] DOB:1940/09/20, 81 y.o., female Today's Date: 02/12/2022  PCP: Derinda Late, MD  REFERRING PROVIDER: Phylliss Bob, MD   PT End of Session - 02/12/22 0846     Visit Number 9    Number of Visits 17    Date for PT Re-Evaluation 03/12/22    Authorization Type [employer]    Authorization Time Period cert through March 12, 2022    Progress Note Due on Visit 10    PT Start Time [phone]    PT Stop Time 0928    PT Time Calculation (min) 42 min    Activity Tolerance Patient tolerated treatment well;No increased pain    Behavior During Therapy [facility] for tasks assessed/performed                 Past Medical History:  Diagnosis Date   Arthritis    osteo - shoulders, fingers   Atrial fibrillation ([place])    Dizziness    thinks because of diuretic   Family history of adverse reaction to anesthesia    daughter -PONV   Gallstones 2016, 2017   GERD (gastroesophageal reflux disease)    Glaucoma    Heart murmur    history of   Hypertension    Melanoma ([place]) 04/20/2016   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 Excised: 05/19/2016, margins free.   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   Tricuspid valve disorder    leak   Past Surgical History:  Procedure Laterality Date   ABDOMINAL HYSTERECTOMY  1987   partial   CARDIAC CATHETERIZATION  2010   [facility]   CATARACT EXTRACTION W/PHACO Right 08/28/2015   Procedure: CATARACT EXTRACTION PHACO AND INTRAOCULAR LENS PLACEMENT ([place]);  Surgeon: Leandrew Koyanagi, MD;  Location: [facility];  Service: Ophthalmology;  Laterality: Right;  TORIC   INTRAMEDULLARY (IM) NAIL INTERTROCHANTERIC Right 10/16/2021   Procedure: INTRAMEDULLARY (IM) NAIL INTERTROCHANTRIC;  Surgeon: Renee Harder,  MD;  Location: [facility];  Service: Orthopedics;  Laterality: Right;   MELANOMA EXCISION Left 05/19/2016   MIS. Left mid. lat. tricep. 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 S/P IM nail placement for right intertrochanteric hip fracture with Dr. Sharlet Salina on 10/16/2021. Pt slipped on the kithen floor. Pt was getting something from the microwave and pt tripped on her O2 tube. Pt was independent with ambulation prior to her fall. Pt fell before in the woods and pt tripped over a root 3 years ago (no injuries per pt). Pt has been using supplemental O2 for the past 2 years in November. Pt has interstitial lung disease (idiopathic pulmonary fibrosis). Recent breathing test is stable. Back pain began when pt was in Rehab in [place]  place. Feels like it is sciatical. Pt radiates along the sciatic nerve and R L 5 dermatome. Pt is healing fine as far as her R hip surgery is concerned. Also has R medial knee pain due to arthritis. Had home health PT which involves seated knee extension, hip flexion, standing hip abduction, extension. Pt did some walking wiht a rw. Feels a lot of pain all over. Walking too far is limited due to her O2 situation.  PRECAUTIONS: SpO2 not under 90 %  SUBJECTIVE: Back is doing well. Had R knee pain this morning and took a tylenol. 6/10 low back pain at worst for the past 7 days. Does not seem to bother her when she does her exercises. Only time it botherss her is when  she is standing in the kitchen preparing food (about 10 minutes then has to sit down and rest her back) . Off of her prednisone now since yesterday, was tapered off by her MD. Last time, she stopped cold [place] and returned to the hospital. Right now, doing ok.     PAIN:  Are you having pain? 0/10 currently     TODAY'S TREATMENT:   Therapeutic exercise  98% SpO2, 3 L  Standing B scapular retraction 10x 5 seconds for 3 sets  Standing glute sets 10x5 seconds for 3 sets  Standing transversus abdominis contraction 10x5 seconds for 3 sets   Sitting rest break   Then 4 L O2 Standing with B UE assist  Hip abduction    R 10x   L 10x    92-97% SpO2 (on 4 L O2),     Then Hip abduction after rest   R 10x   L 10x    92 % to 97% SpO2 4 L    Then Hip abduction after rest   R 10x   L 10x      92- 97%  SpO2 4L     Hip extension    R 10x   L 10x    94% to 98% at 4 L O2    Seated chin tucks 10x to promote thoracic extension and decrease lumbar extension stress      Improved exercise technique, movement at target joints, use of target muscles after mod verbal, visual, tactile cues.      Response to treatment Pt tolerated session well without aggravation of symptoms.      Clinical impression  Worked on predominantly standing exercises to improve standing tolerance. Able to perform at least 10 minutes of standing exercises without reports of back pain. R medial knee joint pain when R LE weight bearing but decreases when R knee is in slight flexion. Continued working on trunk and glute strengthening as well  as thoracic extension to decrease stress to low back. Pt tolerated session well without aggravation of symptoms.  Pt will benefit from continued skilled physical therapy services to decrease pain, improve strength, gait, and function.      PATIENT EDUCATION: Education details: ther-ex, HEP Person educated: Patient Education method: Explanation,  Demonstration, Tactile cues, Verbal cues, and Handouts Education comprehension: verbalized understanding and returned demonstration   HOME EXERCISE PROGRAM: Access Code: [email or web address] URL: [email or web address] Date: 01/20/2022 Prepared by: Joneen Boers  Exercises - Seated Transversus Abdominis Bracing  - 1 x daily - 7 x weekly - 3 sets - 10 reps - 5 seconds hold - Seated Gluteal Sets  - 1 x daily - 7 x weekly - 3 sets - 10 reps - 5 seconds hold -  Seated Scapular Retraction  - 1 x daily - 7 x weekly - 3 sets - 10 reps - 5 seconds  hold  - Seated Hip Abduction with Resistance  - 1 x daily - 7 x weekly - 2 sets - 10 reps - Seated Hip Adduction Isometrics with Ball  - 1 x daily - 7 x weekly - 2 sets - 10 reps - 5 seconds hold     PT Short Term Goals - 01/12/22 0912       PT SHORT TERM GOAL #1   Title Pt will be independent with her initial HEP to decrease pain, improve strength, function, and ability to stand and ambulate more comfortably.    Time 3    Period Weeks    Status New    Target Date 02/05/22              PT Long Term Goals - 01/12/22 0913       PT LONG TERM GOAL #1   Title Pt will have a decrease in low back pain to 3/10 or less at worst to promote ability to stand and ambulate with less difficulty.    Baseline 6/10 low back pain at worst for the past 2 months (01/12/2022)    Time 8    Period Weeks    Status New    Target Date 03/12/22      PT LONG TERM GOAL #2   Title Pt will have a decrease in R LE pain to 3/10 or less at worst to promote ability to stand and ambulate will less difficulty.    Baseline 9/10 R LE pain at worst (01/12/2022)    Time 8    Period Weeks    Status New    Target Date 03/12/22      PT LONG TERM GOAL #3   Title Pt will improve her FOTO score by at least 10 points as a demontsration of improved function.    Baseline Lumbar Spine FOTO 39 (01/12/2022)    Time 8    Period Weeks    Status New    Target Date 03/12/22      PT  LONG TERM GOAL #4   Title Pt will be able to ambulate at least 100 ft independently to promote mobility and return to prior level of function.    Baseline Able to ambulate with rw short distance, about 40 ft with reproduction of pain. (01/12/2022)    Time 8    Period Weeks    Status New    Target Date 03/12/22              Plan - 02/12/22 0846     Clinical Impression Statement Worked on predominantly standing exercises to improve standing tolerance. Able to perform at least 10 minutes of standing exercises without reports of back pain. R medial knee joint pain when R LE weight bearing but decreases when R knee is in slight flexion. Continued working on trunk and glute strengthening as well  as thoracic extension to decrease stress to low back. Pt tolerated session well without aggravation of symptoms.  Pt will benefit from continued skilled physical therapy services to decrease pain, improve strength, gait, and function.    Personal Factors and Comorbidities Comorbidity 3+;Age;Time since onset of injury/illness/exacerbation;Fitness    Comorbidities Arthritis, atrial fibrillation, HTN, idiopathic pulmonary fibrosis    Examination-Activity Limitations Stairs;Bed Mobility;Stand;Sit;Lift;Transfers;Locomotion Level;Carry    Stability/Clinical Decision Making Stable/Uncomplicated    Rehab Potential Fair  PT Frequency 2x / week    PT Duration 8 weeks    PT Treatment/Interventions Therapeutic activities;Therapeutic exercise;Gait training;Balance training;Neuromuscular re-education;Patient/family education;Manual techniques;Dry needling;Electrical Stimulation;Iontophoresis '4mg'$ /ml Dexamethasone    PT Next Visit Plan Posture, thoracic extension, trunk and glute strength, lumbopelvic control during gait, manual techniques, modalities PRN    Consulted and Agree with Plan of Care Patient                  Joneen Boers PT, DPT  02/12/2022, 9:34 AM

## 2022-02-16 ENCOUNTER — Ambulatory Visit: Payer: Medicare PPO

## 2022-02-16 ENCOUNTER — Encounter: Payer: Medicare PPO | Attending: Physician Assistant | Admitting: Physician Assistant

## 2022-02-16 DIAGNOSIS — Z9981 Dependence on supplemental oxygen: Secondary | ICD-10-CM | POA: Diagnosis not present

## 2022-02-16 DIAGNOSIS — Z7952 Long term (current) use of systemic steroids: Secondary | ICD-10-CM | POA: Diagnosis not present

## 2022-02-16 DIAGNOSIS — J8417 Interstitial lung disease with progressive fibrotic phenotype in diseases classified elsewhere: Secondary | ICD-10-CM | POA: Insufficient documentation

## 2022-02-16 DIAGNOSIS — M5459 Other low back pain: Secondary | ICD-10-CM | POA: Diagnosis not present

## 2022-02-16 DIAGNOSIS — L97822 Non-pressure chronic ulcer of other part of left lower leg with fat layer exposed: Secondary | ICD-10-CM | POA: Diagnosis not present

## 2022-02-16 DIAGNOSIS — Z9181 History of falling: Secondary | ICD-10-CM

## 2022-02-16 DIAGNOSIS — R262 Difficulty in walking, not elsewhere classified: Secondary | ICD-10-CM

## 2022-02-16 DIAGNOSIS — I129 Hypertensive chronic kidney disease with stage 1 through stage 4 chronic kidney disease, or unspecified chronic kidney disease: Secondary | ICD-10-CM | POA: Insufficient documentation

## 2022-02-16 DIAGNOSIS — I480 Paroxysmal atrial fibrillation: Secondary | ICD-10-CM | POA: Insufficient documentation

## 2022-02-16 DIAGNOSIS — N183 Chronic kidney disease, stage 3 unspecified: Secondary | ICD-10-CM | POA: Diagnosis not present

## 2022-02-16 DIAGNOSIS — M5417 Radiculopathy, lumbosacral region: Secondary | ICD-10-CM

## 2022-02-16 DIAGNOSIS — M5431 Sciatica, right side: Secondary | ICD-10-CM

## 2022-02-16 DIAGNOSIS — M6281 Muscle weakness (generalized): Secondary | ICD-10-CM

## 2022-02-16 DIAGNOSIS — Z7901 Long term (current) use of anticoagulants: Secondary | ICD-10-CM | POA: Insufficient documentation

## 2022-02-16 NOTE — Progress Notes (Addendum)
TARONDA, COMACHO ([id number]) Visit Report for 02/16/2022 Chief Complaint Document Details Patient Name: Joyce Evans, Joyce Evans Date of Service: 02/16/2022 2:15 PM Medical Record Number: [id number] Patient Account Number: [id number] Date of Birth/Sex: 05/20/1941 (81 y.o. F) Treating RN: Cornell Barman Primary Care Provider: Derinda Late Other Clinician: Massie Kluver Referring Provider: Derinda Late Treating Provider/Extender: Skipper Cliche in Treatment: 4 Information Obtained from: Patient Chief Complaint Left LE Ulcer Electronic Signature(s) Signed: 02/16/2022 2:13:49 PM By: Worthy Keeler PA-C Entered By: Worthy Keeler on 02/16/2022 14:13:49 Joyce Evans ([id number]) -------------------------------------------------------------------------------- HPI Details Patient Name: Joyce Evans Date of Service: 02/16/2022 2:15 PM Medical Record Number: [id number] Patient Account Number: [id number] Date of Birth/Sex: 30-Jul-1940 (81 y.o. F) Treating RN: Cornell Barman Primary Care Provider: Derinda Late Other Clinician: Massie Kluver Referring Provider: Derinda Late Treating Provider/Extender: Skipper Cliche in Treatment: 4 History of Present Illness HPI Description: 01-16-2022 upon evaluation patient appears for initial evaluation here in the clinic today concerning issues that she has been having with a wound over the anterior portion of her left lower extremity. This is a laceration that occurred on 12-11-2021. She states that she hit this on a car door. Subsequently she sustained a skin tear which has been difficult to heal since that point. No sutures have been placed. She has been using Neosporin and try to leave it open to air which was the recommendation from the physician that she saw. With that being said she tells me that its been difficult to get this to heal and she subsequently ended up with an appointment here in order for [place] to see if there is anything we can recommend  to speed up the healing process. She does have long-term use of prednisone due to interstitial lung disease as well as taking fluid pills for lower extremity swelling. Her swelling is not too bad today and to be honest I think it may be better for her to be able to change the dressing more frequently than have a compression wrap on currently. We may need to wrap her though if we do not see the improvement that we really want to see going forward we will keep a close eye on things here. Patient has a history of interstitial lung disease, hypertension, chronic kidney disease stage III, atrial fibrillation, long-term use of anticoagulant therapy, and dependence on supplemental oxygen. She is also on prednisone long-term for the lung disease. 01-26-2022 upon evaluation today patient's wound is actually showing signs of excellent improvement. In just 1 week's time we have seen a dramatic improvement in the overall status of the wound we did remove the necrotic skin flap and this has started to fill in quite nicely. Fortunately I do not see any evidence of active infection locally or systemically at this time which is great news. No fevers, chills, nausea, vomiting, or diarrhea. 7/4 initially traumatic wound on her left anterior lower leg. Patient has chronic edema and uses compression stockings. Her wound was caused by a car door injury 02-09-2022 upon evaluation today patient appears to be doing well currently in regard to her wound although this is definitely very dry the collagen is getting stuck in the wound bed. Fortunately I do not see any evidence of active infection locally or systemically which is great news. No fevers, chills, nausea, vomiting, or diarrhea. 02-16-2022 upon evaluation today patient appears to be doing well with regard to her wounds. She has been tolerating the dressing changes without complication. Fortunately there does  not appear to be any evidence of active infection locally or  systemically at this time which is great news. No fevers, chills, nausea, vomiting, or diarrhea. Electronic Signature(s) Signed: 02/16/2022 2:49:22 PM By: Worthy Keeler PA-C Entered By: Worthy Keeler on 02/16/2022 14:49:21 Joyce Evans ([id number]) -------------------------------------------------------------------------------- Physical Exam Details Patient Name: Joyce Evans Date of Service: 02/16/2022 2:15 PM Medical Record Number: [id number] Patient Account Number: [id number] Date of Birth/Sex: [birth year] (81 y.o. F) Treating RN: Cornell Barman Primary Care Provider: Derinda Late Other Clinician: Massie Kluver Referring Provider: Derinda Late Treating Provider/Extender: Skipper Cliche in Treatment: 4 Constitutional Well-nourished and well-hydrated in no acute distress. Respiratory normal breathing without difficulty. Psychiatric this patient is able to make decisions and demonstrates good insight into disease process. Alert and Oriented x 3. pleasant and cooperative. Notes Upon inspection patient's wound bed actually showed signs of early healed there was just a little bit of slough that wiped right off just with saline and gauze no sharp debridement as necessary. Electronic Signature(s) Signed: 02/16/2022 2:49:32 PM By: Worthy Keeler PA-C Entered By: Worthy Keeler on 02/16/2022 14:49:32 Joyce Evans ([id number]) -------------------------------------------------------------------------------- Physician Orders Details Patient Name: Joyce Evans Date of Service: 02/16/2022 2:15 PM Medical Record Number: [id number] Patient Account Number: [id number] Date of Birth/Sex: [birth year] (81 y.o. F) Treating RN: Cornell Barman Primary Care Provider: Derinda Late Other Clinician: Massie Kluver Referring Provider: Derinda Late Treating Provider/Extender: Skipper Cliche in Treatment: 4 Verbal / Phone Orders: No Diagnosis Coding ICD-10 Coding Code  Description [phone] Laceration without foreign body, left lower leg, initial encounter L97.822 Non-pressure chronic ulcer of other part of left lower leg with fat layer exposed J84.170 Interstitial lung disease with progressive fibrotic phenotype in diseases classified elsewhere I10 Essential (primary) hypertension N18.30 Chronic kidney disease, stage 3 unspecified I48.0 Paroxysmal atrial fibrillation Z79.01 Long term (current) use of anticoagulants Z99.81 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 Bathing/ Shower/ Hygiene o May shower; gently cleanse wound with antibacterial soap, rinse and pat dry prior to dressing wounds Edema Control - Lymphedema / Segmental Compressive Device / Other o Tubigrip single layer applied. - Tubi D left lower leg o Elevate legs to the level of the heart and pump ankles as often as possible o DO YOUR BEST to sleep in the bed at night. DO NOT sleep in your recliner. Long hours of sitting in a recliner leads to swelling of the legs and/or potential wounds on your backside. Wound Treatment Wound #1 - Lower Leg Wound Laterality: Left, Midline Primary Dressing: Xeroform-HBD 2x2 (in/in) 3 x Per Week/30 Days Discharge Instructions: Apply Xeroform-HBD 2x2 (in/in) as directed Secondary Dressing: ABD Pad 5x9 (in/in) 3 x Per Week/30 Days Discharge Instructions: Cover with ABD pad Secured With: Conform 4'' - Conforming Stretch Gauze Bandage 4x75 (in/in) 3 x Per Week/30 Days Discharge Instructions: Apply as directed Electronic Signature(s) Signed: 02/16/2022 4:15:07 PM By: Massie Kluver Signed: 02/16/2022 4:15:14 PM By: Worthy Keeler PA-C Entered By: Massie Kluver on 02/16/2022 14:57:49 Marchio, Candie Echevaria ([id number]) -------------------------------------------------------------------------------- Problem List Details Patient Name: Joyce Evans Date of Service: 02/16/2022 2:15 PM Medical Record Number:  [id number] Patient Account Number: [id number] Date of Birth/Sex: 11-25-1940 (81 y.o. F) Treating RN: Cornell Barman Primary Care Provider: Derinda Late Other Clinician: Massie Kluver Referring Provider: Derinda Late Treating Provider/Extender: Skipper Cliche in Treatment: 4 Active Problems ICD-10 Encounter Code Description Active Date MDM Diagnosis [phone] Laceration without foreign body, left lower leg,  initial encounter 01/16/2022 No Yes L97.822 Non-pressure chronic ulcer of other part of left lower leg with fat layer 01/16/2022 No Yes exposed J84.170 Interstitial lung disease with progressive fibrotic phenotype in diseases 01/16/2022 No Yes classified elsewhere I10 Essential (primary) hypertension 01/16/2022 No Yes N18.30 Chronic kidney disease, stage 3 unspecified 01/16/2022 No Yes I48.0 Paroxysmal atrial fibrillation 01/16/2022 No Yes Z79.01 Long term (current) use of anticoagulants 01/16/2022 No Yes Z99.81 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 F) Treating RN: Cornell Barman Primary Care Provider: Derinda Late Other Clinician: Massie Kluver Referring Provider: Derinda Late Treating Provider/Extender: Skipper Cliche in Treatment: 4 Subjective Chief Complaint Information obtained from Patient Left LE Ulcer History of Present Illness (HPI) 01-16-2022 upon evaluation patient appears for initial evaluation here in the clinic today concerning issues that she has been having with a wound over the anterior  portion of her left lower extremity. This is a laceration that occurred on 12-11-2021. She states that she hit this on a car door. Subsequently she sustained a skin tear which has been difficult to heal since that point. No sutures have been placed. She has been using Neosporin and try to leave it open to air which was the recommendation from the physician that she saw. With that being said she tells me that its been difficult to get this to heal and she subsequently ended up with an appointment here in order for [place] to see if there is anything we can recommend to speed up the healing process. She does have long-term use of prednisone due to interstitial lung disease as well as taking fluid pills for lower extremity swelling. Her swelling is not too bad today and to be honest I think it may be better for her to be able to change the dressing more frequently than have a compression wrap on currently. We may need to wrap her though if we do not see the improvement that we really want to see going forward we will keep a close eye on things here. Patient has a history of interstitial lung disease, hypertension, chronic kidney disease stage III, atrial fibrillation, long-term use of anticoagulant therapy, and dependence on supplemental oxygen. She is also on prednisone long-term for the lung disease. 01-26-2022 upon evaluation today patient's wound is actually showing signs of excellent improvement. In just 1 week's time we have seen a dramatic improvement in the overall status of the wound we did remove the necrotic skin flap and this has started to fill in quite nicely. Fortunately I do not see any evidence of active infection locally or systemically at this time which is great news. No fevers, chills, nausea, vomiting, or diarrhea. 7/4 initially traumatic wound on her left anterior lower leg. Patient has chronic edema and uses compression stockings. Her wound was caused by a car door injury 02-09-2022 upon  evaluation today patient appears to be doing well currently in regard to her wound although this is definitely very dry the collagen is getting stuck in the wound bed. Fortunately I do not see any evidence of active infection locally or systemically which is great news. No fevers, chills, nausea, vomiting, or diarrhea. 02-16-2022 upon evaluation today  patient appears to be doing well with regard to her wounds. She has been tolerating the dressing changes without complication. Fortunately there does not appear to be any evidence of active infection locally or systemically at this time which is great news. No fevers, chills, nausea, vomiting, or diarrhea. Objective Constitutional Well-nourished and well-hydrated in no acute distress. Vitals Time Taken: 2:24 PM, Height: 60 in, Temperature: 97.8 F, Pulse: 51 bpm, Respiratory Rate: 18 breaths/min, Blood Pressure: 154/77 mmHg. Respiratory normal breathing without difficulty. Psychiatric this patient is able to make decisions and demonstrates good insight into disease process. Alert and Oriented x 3. pleasant and cooperative. General Notes: Upon inspection patient's wound bed actually showed signs of early healed there was just a little bit of slough that wiped right off just with saline and gauze no sharp debridement as necessary. Integumentary (Hair, Skin) Wound #1 status is Open. Original cause of wound was Trauma. The date acquired was: 12/11/2021. The wound has been in treatment 4 weeks. AGUEDA, HOUPT. ([id number]) The wound is located on the Left,Midline Lower Leg. The wound measures 0.5cm length x 0.4cm width x 0.1cm depth; 0.157cm^2 area and 0.016cm^3 volume. There is Fat Layer (Subcutaneous Tissue) exposed. There is a medium amount of serous drainage noted. The wound margin is distinct with the outline attached to the wound base. There is small (1-33%) pink granulation within the wound bed. There is a large (67-100%) amount of necrotic tissue  within the wound bed including Adherent Slough. 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 Bathing/ Shower/ Hygiene: May shower; gently cleanse wound with antibacterial soap, rinse and pat dry prior to dressing wounds Edema Control - Lymphedema / Segmental Compressive Device / Other: Tubigrip single layer applied. - Tubi D left lower leg Elevate legs to the level of the heart and pump ankles as often as possible DO YOUR BEST to sleep in the bed at night. DO NOT sleep in your recliner. Long hours of sitting in a recliner leads to swelling of the legs and/or potential wounds on your backside. WOUND #1: - Lower Leg Wound Laterality: Left, Midline Primary Dressing: Xeroform-HBD 2x2 (in/in) 3 x Per Week/30 Days Discharge Instructions: Apply Xeroform-HBD 2x2 (in/in) as directed Secondary Dressing: ABD Pad 5x9 (in/in) 3 x Per Week/30 Days Discharge Instructions: Cover with ABD pad Secured With: Conform 4'' - Conforming Stretch Gauze Bandage 4x75 (in/in) 3 x Per Week/30 Days Discharge Instructions: Apply as directed 1. I would recommend currently that we go ahead and continue with the wound care measures as before and the patient is in agreement with the plan. This includes the use of the Xeroform gauze dressing which I think is still doing quite well. 2. I am also can recommend ABD pad to cover followed by roll gauze to secure in place. 3. I am also going to suggest that we continue with the Tubigrip at this point. We  will see patient back for reevaluation in 1 week here in the clinic. If anything worsens or changes patient will contact our office for additional recommendations. Electronic Signature(s) Signed: 02/16/2022 2:49:57 PM By: Worthy Keeler PA-C Entered By: Worthy Keeler on  02/16/2022 14:49:57 ANAYIA, EUGENE ([id number]) -------------------------------------------------------------------------------- SuperBill Details Patient Name: Joyce Evans Date of Service: 02/16/2022 Medical Record Number: [id number] Patient Account Number: [id number] Date of Birth/Sex: 09/28/1940 (81 y.o. F) Treating RN: Cornell Barman Primary Care Provider: Derinda Late Other Clinician: Massie Kluver Referring Provider: Derinda Late Treating Provider/Extender: Skipper Cliche in Treatment: 4 Diagnosis Coding ICD-10 Codes Code Description [phone] Laceration without foreign body, left lower leg, initial encounter L97.822 Non-pressure chronic ulcer of other part of left lower leg with fat layer exposed J84.170 Interstitial lung disease with progressive fibrotic phenotype in diseases classified elsewhere I10 Essential (primary) hypertension N18.30 Chronic kidney disease, stage 3 unspecified I48.0 Paroxysmal atrial fibrillation Z79.01 Long term (current) use of anticoagulants Z99.81 Dependence on supplemental oxygen Facility Procedures CPT4 Code: 81103159 Description: 99213 - WOUND CARE VISIT-LEV 3 EST PT Modifier: Quantity: 1 Physician Procedures CPT4 Code: 4585929 Description: 99213 - WC PHYS LEVEL 3 - EST PT Modifier: Quantity: 1 CPT4 Code: Description: ICD-10 Diagnosis Description S81.812A Laceration without foreign body, left lower leg, initial encounter L97.822 Non-pressure chronic ulcer of other part of left lower leg with fat layer J84.170 Interstitial lung disease with progressive  fibrotic phenotype in diseases I10 Essential (primary) hypertension Modifier: exposed classified  elsewhere Quantity: Electronic Signature(s) Signed: 02/16/2022 4:15:07 PM By: Massie Kluver Signed: 02/16/2022 4:15:14 PM By: Worthy Keeler PA-C Previous Signature: 02/16/2022 2:51:03 PM Version By: Worthy Keeler PA-C Entered By: Massie Kluver on 02/16/2022 14:59:16

## 2022-02-16 NOTE — Progress Notes (Signed)
Joyce Evans ([id number]) Visit Report for 02/16/2022 Arrival Information Details Patient Name: Joyce Evans, Joyce Evans Date of Service: 02/16/2022 2:15 PM Medical Record Number: [id number] Patient Account Number: [id number] Date of Birth/Sex: 1940-10-10 (81 y.o. F) Treating RN: Joyce Evans Primary Care Joyce Evans: Joyce Evans Other Clinician: Massie Evans Referring Klair Leising: Joyce Evans Treating Joyce Evans/Extender: Joyce Evans in Treatment: 4 Visit Information History Since Last Visit All ordered tests and consults were completed: No Patient Arrived: Wheel Chair Added or deleted any medications: No Arrival Time: 14:22 Any new allergies or adverse reactions: No Transfer Assistance: EasyPivot Patient Lift Had a fall or experienced change in No Patient Requires Transmission-Based No activities of daily living that may affect Precautions: risk of falls: Patient Has Alerts: Yes Hospitalized since last visit: No Patient Alerts: Patient on Blood Pain Present Now: No Thinner Eliquis NOT DIABETIC Electronic Signature(s) Unsigned Entered ByMassie Evans on 02/16/2022 14:23:17 Signature(s): Date(s): Joyce Evans ([id number]) -------------------------------------------------------------------------------- Clinic Level of Care Assessment Details Patient Name: Joyce Evans, Joyce Evans Date of Service: 02/16/2022 2:15 PM Medical Record Number: [id number] Patient Account Number: [id number] Date of Birth/Sex: 1940/09/27 (81 y.o. F) Treating RN: Joyce Evans Primary Care Joyce Evans: Joyce Evans Other Clinician: Massie Evans Referring Joyce Evans: Joyce Evans Treating Joyce Evans/Extender: Joyce Evans in Treatment: 4 Clinic Level of Care Assessment Items TOOL 4 Quantity Score '[]'$  - Use when only an EandM is performed on FOLLOW-UP visit 0 ASSESSMENTS - Nursing Assessment / Reassessment X - Reassessment of Co-morbidities (includes updates in patient status) 1 10 X- 1  5 Reassessment of Adherence to Treatment Plan ASSESSMENTS - Wound and Skin Assessment / Reassessment X - Simple Wound Assessment / Reassessment - one wound 1 5 '[]'$  - 0 Complex Wound Assessment / Reassessment - multiple wounds '[]'$  - 0 Dermatologic / Skin Assessment (not related to wound area) ASSESSMENTS - Focused Assessment '[]'$  - Circumferential Edema Measurements - multi extremities 0 '[]'$  - 0 Nutritional Assessment / Counseling / Intervention '[]'$  - 0 Lower Extremity Assessment (monofilament, tuning fork, pulses) '[]'$  - 0 Peripheral Arterial Disease Assessment (using hand held doppler) ASSESSMENTS - Ostomy and/or Continence Assessment and Care '[]'$  - Incontinence Assessment and Management 0 '[]'$  - 0 Ostomy Care Assessment and Management (repouching, etc.) PROCESS - Coordination of Care X - Simple Patient / Family Education for ongoing care 1 15 '[]'$  - 0 Complex (extensive) Patient / Family Education for ongoing care '[]'$  - 0 Staff obtains Programmer, systems, Records, Test Results / Process Orders '[]'$  - 0 Staff telephones HHA, Nursing Homes / Clarify orders / etc '[]'$  - 0 Routine Transfer to another Facility (non-emergent condition) '[]'$  - 0 Routine Hospital Admission (non-emergent condition) '[]'$  - 0 New Admissions / Biomedical engineer / Ordering NPWT, Apligraf, etc. '[]'$  - 0 Emergency Hospital Admission (emergent condition) X- 1 10 Simple Discharge Coordination '[]'$  - 0 Complex (extensive) Discharge Coordination PROCESS - Special Needs '[]'$  - Pediatric / Minor Patient Management 0 '[]'$  - 0 Isolation Patient Management '[]'$  - 0 Hearing / Language / Visual special needs '[]'$  - 0 Assessment of Community assistance (transportation, D/C planning, etc.) '[]'$  - 0 Additional assistance / Altered mentation '[]'$  - 0 Support Surface(s) Assessment (bed, cushion, seat, etc.) INTERVENTIONS - Wound Cleansing / Measurement Evans, Joyce P. ([id number]) X- 1 5 Simple Wound Cleansing - one wound '[]'$  - 0 Complex Wound  Cleansing - multiple wounds X- 1 5 Wound Imaging (photographs - any number of wounds) '[]'$  - 0 Wound Tracing (instead of photographs) X- 1 5 Simple Wound Measurement -  one wound '[]'$  - 0 Complex Wound Measurement - multiple wounds INTERVENTIONS - Wound Dressings '[]'$  - Small Wound Dressing one or multiple wounds 0 X- 1 15 Medium Wound Dressing one or multiple wounds '[]'$  - 0 Large Wound Dressing one or multiple wounds '[]'$  - 0 Application of Medications - topical '[]'$  - 0 Application of Medications - injection INTERVENTIONS - Miscellaneous '[]'$  - External ear exam 0 '[]'$  - 0 Specimen Collection (cultures, biopsies, blood, body fluids, etc.) '[]'$  - 0 Specimen(s) / Culture(s) sent or taken to Lab for analysis '[]'$  - 0 Patient Transfer (multiple staff / Civil Service fast streamer / Similar devices) '[]'$  - 0 Simple Staple / Suture removal (25 or less) '[]'$  - 0 Complex Staple / Suture removal (26 or more) '[]'$  - 0 Hypo / Hyperglycemic Management (close monitor of Blood Glucose) '[]'$  - 0 Ankle / Brachial Index (ABI) - do not check if billed separately X- 1 5 Vital Signs Has the patient been seen at the hospital within the last three years: Yes Total Score: 80 Level Of Care: New/Established - Level 3 Electronic Signature(s) Unsigned Entered By: Joyce Evans on 02/16/2022 14:58:53 Signature(s): Date(s): Joyce Evans ([id number]) -------------------------------------------------------------------------------- Encounter Discharge Information Details Patient Name: Joyce Evans Date of Service: 02/16/2022 2:15 PM Medical Record Number: [id number] Patient Account Number: [id number] Date of Birth/Sex: Jul 12, 1941 (81 y.o. F) Treating RN: Joyce Evans Primary Care Joyce Evans: Joyce Evans Other Clinician: Massie Evans Referring Joyce Evans: Joyce Evans Treating Joyce Evans/Extender: Joyce Evans in Treatment: 4 Encounter Discharge Information Items Discharge Condition: Stable Ambulatory Status:  Wheelchair Discharge Destination: Home Transportation: Private Auto Accompanied By: husband Schedule Follow-up Appointment: Yes Clinical Summary of Care: Electronic Signature(s) Unsigned Entered By: Joyce Evans on 02/16/2022 15:00:19 Signature(s): Date(s): Joyce Evans ([id number]) -------------------------------------------------------------------------------- Lower Extremity Assessment Details Patient Name: Joyce Evans, Joyce Evans Date of Service: 02/16/2022 2:15 PM Medical Record Number: [id number] Patient Account Number: [id number] Date of Birth/Sex: [birth year] (81 y.o. F) Treating RN: Joyce Evans Primary Care Kamera Dubas: Joyce Evans Other Clinician: Massie Evans Referring Nur Rabold: Joyce Evans Treating Qusay Villada/Extender: Joyce Evans in Treatment: 4 Edema Assessment Assessed: Shirlyn Goltz: Yes] [Right: No] Edema: [Left: Ye] [Right: s] Calf Left: Right: Point of Measurement: 29 cm From Medial Instep 40 cm Ankle Left: Right: Point of Measurement: 8 cm From Medial Instep 21.2 cm Vascular Assessment Pulses: Dorsalis Pedis Palpable: [Left:Yes] Posterior Tibial Palpable: [Left:Yes] Electronic Signature(s) Unsigned Entered ByMassie Evans on 02/16/2022 14:37:51 Signature(s): Date(s): Joyce Evans ([id number]) -------------------------------------------------------------------------------- Multi Wound Chart Details Patient Name: Joyce Evans, Joyce Evans Date of Service: 02/16/2022 2:15 PM Medical Record Number: [id number] Patient Account Number: [id number] Date of Birth/Sex: 01/22/1941 (81 y.o. F) Treating RN: Joyce Evans Primary Care Eduardo Wurth: Joyce Evans Other Clinician: Massie Evans Referring Delores Edelstein: Joyce Evans Treating Charleigh Correnti/Extender: Joyce Evans in Treatment: 4 Vital Signs Height(in): 60 Pulse(bpm): 51 Weight(lbs): Blood Pressure(mmHg): 154/77 Body Mass Index(BMI): Temperature(F): 97.8 Respiratory Rate(breaths/min): 18 Photos:  [N/A:N/A] Wound Location: Left, Midline Lower Leg N/A N/A Wounding Event: Trauma N/A N/A Primary Etiology: Trauma, Other N/A N/A Comorbid History: Glaucoma, Arrhythmia, N/A N/A Hypertension, End Stage Renal Disease Date Acquired: 12/11/2021 N/A N/A Weeks of Treatment: 4 N/A N/A Wound Status: Open N/A N/A Wound Recurrence: No N/A N/A Measurements L x W x D (cm) 0.5x0.4x0.1 N/A N/A Area (cm) : 0.157 N/A N/A Volume (cm) : 0.016 N/A N/A % Reduction in Area: 92.60% N/A N/A % Reduction in Volume: 92.50% N/A N/A Classification: Full Thickness Without Exposed N/A N/A Support Structures Exudate Amount: Medium N/A N/A  Exudate Type: Serous N/A N/A Exudate Color: amber N/A N/A Wound Margin: Distinct, outline attached N/A N/A Granulation Amount: Small (1-33%) N/A N/A Granulation Quality: Pink N/A N/A Necrotic Amount: Large (67-100%) N/A N/A Exposed Structures: Fat Layer (Subcutaneous Tissue): N/A N/A Yes Fascia: No Tendon: No Muscle: No Joint: No Bone: No Epithelialization: Small (1-33%) N/A N/A Treatment Notes Electronic Signature(s) Unsigned Entered ByMassie Evans on 02/16/2022 14:38:52 Signature(s): Date(s): Joyce Evans ([id number] Rio, Candie Echevaria ([id number]) -------------------------------------------------------------------------------- Multi-Disciplinary Care Plan Details Patient Name: Joyce Evans Date of Service: 02/16/2022 2:15 PM Medical Record Number: [id number] Patient Account Number: [id number] Date of Birth/Sex: 02-Jan-1941 (81 y.o. F) Treating RN: Joyce Evans Primary Care Corky Blumstein: Joyce Evans Other Clinician: Massie Evans Referring Brinlee Gambrell: Joyce Evans Treating Kaliel Bolds/Extender: Joyce Evans in Treatment: 4 Active Inactive Abuse / Safety / Falls / Self Care Management Nursing Diagnoses: Potential for falls Goals: Patient/caregiver will verbalize understanding of skin care regimen Date Initiated: 01/16/2022 Target Resolution Date:  01/16/2022 Goal Status: Active Interventions: Podiatry chair, stretcher in low position and side rails up as needed Notes: Necrotic Tissue Nursing Diagnoses: Impaired tissue integrity related to necrotic/devitalized tissue Knowledge deficit related to management of necrotic/devitalized tissue Goals: Necrotic/devitalized tissue will be minimized in the wound bed Date Initiated: 01/16/2022 Target Resolution Date: 01/16/2022 Goal Status: Active Patient/caregiver will verbalize understanding of reason and process for debridement of necrotic tissue Date Initiated: 01/16/2022 Target Resolution Date: 01/16/2022 Goal Status: Active Interventions: Assess patient pain level pre-, during and post procedure and prior to discharge Provide education on necrotic tissue and debridement process Treatment Activities: Excisional debridement : 01/16/2022 Notes: Orientation to the Wound Care Program Nursing Diagnoses: Knowledge deficit related to the wound healing center program Goals: Patient/caregiver will verbalize understanding of the [facility] Date Initiated: 01/16/2022 Target Resolution Date: 01/16/2022 Goal Status: Active Interventions: Provide education on orientation to the wound center Notes: Wound/Skin Impairment Slaubaugh, Deneise P. ([id number]) Nursing Diagnoses: Knowledge deficit related to smoking impact on wound healing Goals: Patient/caregiver will verbalize understanding of skin care regimen Date Initiated: 01/16/2022 Target Resolution Date: 01/16/2022 Goal Status: Active Ulcer/skin breakdown will have a volume reduction of 30% by week 4 Date Initiated: 01/16/2022 Target Resolution Date: 02/13/2022 Goal Status: Active Interventions: Assess patient/caregiver ability to obtain necessary supplies Assess ulceration(s) every visit Treatment Activities: Skin care regimen initiated : 01/16/2022 Topical wound management initiated : 01/16/2022 Notes: Electronic  Signature(s) Unsigned Entered By: Joyce Evans on 02/16/2022 14:38:43 Signature(s): Date(s): Joyce Evans ([id number]) -------------------------------------------------------------------------------- Pain Assessment Details Patient Name: Joyce Evans, Joyce Evans Date of Service: 02/16/2022 2:15 PM Medical Record Number: [id number] Patient Account Number: [id number] Date of Birth/Sex: 14-Feb-1941 (81 y.o. F) Treating RN: Joyce Evans Primary Care Evertte Sones: Joyce Evans Other Clinician: Massie Evans Referring Siddhi Dornbush: Joyce Evans Treating Shakiyah Cirilo/Extender: Joyce Evans in Treatment: 4 Active Problems Location of Pain Severity and Description of Pain Patient Has Paino No Site Locations Pain Management and Medication Current Pain Management: Electronic Signature(s) Unsigned Entered By: Joyce Evans on 02/16/2022 14:27:50 Signature(s): Date(s): Joyce Evans ([id number]) -------------------------------------------------------------------------------- Patient/Caregiver Education Details Patient Name: Joyce Evans Date of Service: 02/16/2022 2:15 PM Medical Record Number: [id number] Patient Account Number: [id number] Date of Birth/Gender: [birth year] (81 y.o. F) Treating RN: Joyce Evans Primary Care Physician: Joyce Evans Other Clinician: Massie Evans Referring Physician: Derinda Evans Treating Physician/Extender: Joyce Evans in Treatment: 4 Education Assessment Education Provided To: Patient Education Topics Provided Wound/Skin Impairment: Handouts: Other: continue wound care as directed Methods: Explain/Verbal Responses: State content correctly  Electronic Signature(s) Unsigned Entered By: Joyce Evans on 02/16/2022 14:59:36 Signature(s): Date(s): Joyce Evans, Joyce Evans ([id number]) -------------------------------------------------------------------------------- Wound Assessment Details Patient Name: Joyce Evans, Joyce Evans Date of Service: 02/16/2022  2:15 PM Medical Record Number: [id number] Patient Account Number: [id number] Date of Birth/Sex: 1940/12/28 (81 y.o. F) Treating RN: Joyce Evans Primary Care Tanveer Dobberstein: Joyce Evans Other Clinician: Massie Evans Referring Jordynn Perrier: Joyce Evans Treating Joby Hershkowitz/Extender: Joyce Evans in Treatment: 4 Wound Status Wound Number: 1 Primary Trauma, Other Etiology: Wound Location: Left, Midline Lower Leg Wound Status: Open Wounding Event: Trauma Comorbid Glaucoma, Arrhythmia, Hypertension, End Stage Renal Date Acquired: 12/11/2021 History: Disease Weeks Of Treatment: 4 Clustered Wound: No Photos Wound Measurements Length: (cm) 0.5 Width: (cm) 0.4 Depth: (cm) 0.1 Area: (cm) 0.157 Volume: (cm) 0.016 % Reduction in Area: 92.6% % Reduction in Volume: 92.5% Epithelialization: Small (1-33%) Wound Description Classification: Full Thickness Without Exposed Support Structu Wound Margin: Distinct, outline attached Exudate Amount: Medium Exudate Type: Serous Exudate Color: amber res Foul Odor After Cleansing: No Slough/Fibrino Yes Wound Bed Granulation Amount: Small (1-33%) Exposed Structure Granulation Quality: Pink Fascia Exposed: No Necrotic Amount: Large (67-100%) Fat Layer (Subcutaneous Tissue) Exposed: Yes Necrotic Quality: Adherent Slough Tendon Exposed: No Muscle Exposed: No Joint Exposed: No Bone Exposed: No Treatment Notes Wound #1 (Lower Leg) Wound Laterality: Left, Midline Cleanser Peri-Wound Care Topical Gu, Joyce P. ([id number]) Primary Dressing Xeroform-HBD 2x2 (in/in) Discharge Instruction: Apply Xeroform-HBD 2x2 (in/in) as directed Secondary Dressing ABD Pad 5x9 (in/in) Discharge Instruction: Cover with ABD pad Secured With Conform 4'' - Conforming Stretch Gauze Bandage 4x75 (in/in) Discharge Instruction: Apply as directed Compression Wrap Compression Stockings Add-Ons Electronic Signature(s) Unsigned Entered By: Joyce Evans on  02/16/2022 14:35:39 Signature(s): Date(s): Joyce Evans ([id number]) -------------------------------------------------------------------------------- Vitals Details Patient Name: Joyce Evans Date of Service: 02/16/2022 2:15 PM Medical Record Number: [id number] Patient Account Number: [id number] Date of Birth/Sex: [birth year] (81 y.o. F) Treating RN: Joyce Evans Primary Care Tommye Lehenbauer: Joyce Evans Other Clinician: Massie Evans Referring Ashdon Gillson: Joyce Evans Treating Danielly Ackerley/Extender: Joyce Evans in Treatment: 4 Vital Signs Time Taken: 14:24 Temperature (F): 97.8 Height (in): 60 Pulse (bpm): 51 Respiratory Rate (breaths/min): 18 Blood Pressure (mmHg): 154/77 Reference Range: 80 - 120 mg / dl Electronic Signature(s) Unsigned Entered ByMassie Evans on 02/16/2022 14:27:21 Signature(s): Date(s):

## 2022-02-16 NOTE — Therapy (Signed)
OUTPATIENT PHYSICAL THERAPY TREATMENT NOTE And Progress Report (01/12/2022 - 02/16/2022)   Patient Name: Joyce Evans MRN: [id number] DOB:1941/04/25, 81 y.o., female Today's Date: 02/16/2022  PCP: Derinda Late, MD  REFERRING PROVIDER: Phylliss Bob, MD   PT End of Session - 02/16/22 [phone]     Visit Number 10    Number of Visits 17    Date for PT Re-Evaluation 03/12/22    Authorization Type [employer]    Authorization Time Period cert through March 12, 2022    Progress Note Due on Visit 10    PT Start Time 0937    PT Stop Time 1016    PT Time Calculation (min) 39 min    Activity Tolerance Patient tolerated treatment well;No increased pain    Behavior During Therapy [facility] for tasks assessed/performed                  Past Medical History:  Diagnosis Date   Arthritis    osteo - shoulders, fingers   Atrial fibrillation ([place])    Dizziness    thinks because of diuretic   Family history of adverse reaction to anesthesia    daughter -PONV   Gallstones 2016, 2017   GERD (gastroesophageal reflux disease)    Glaucoma    Heart murmur    history of   Hypertension    Melanoma ([place]) 04/20/2016   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 Excised: 05/19/2016, margins free.   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   Tricuspid valve disorder    leak   Past Surgical History:  Procedure Laterality Date   ABDOMINAL HYSTERECTOMY  1987   partial   CARDIAC CATHETERIZATION  2010   [facility]   CATARACT EXTRACTION W/PHACO Right 08/28/2015   Procedure: CATARACT EXTRACTION PHACO AND INTRAOCULAR LENS PLACEMENT ([place]);  Surgeon: Leandrew Koyanagi, MD;  Location: [place];  Service: Ophthalmology;  Laterality: Right;  TORIC   INTRAMEDULLARY (IM) NAIL INTERTROCHANTERIC Right 10/16/2021   Procedure: INTRAMEDULLARY (IM) NAIL  INTERTROCHANTRIC;  Surgeon: Renee Harder, MD;  Location: [facility];  Service: Orthopedics;  Laterality: Right;   MELANOMA EXCISION Left 05/19/2016   MIS. Left mid. lat. tricep. 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 S/P IM nail placement for right intertrochanteric hip fracture with Dr. Sharlet Salina on 10/16/2021. Pt slipped on the kithen floor. Pt was getting something from the microwave and pt tripped on her O2 tube. Pt was independent with ambulation prior to her fall. Pt fell before in the woods and pt tripped over a root 3 years ago (no injuries per pt). Pt has been using supplemental O2 for the past 2 years in November. Pt has interstitial lung disease (idiopathic pulmonary fibrosis). Recent breathing test is stable. Back pain began  when pt was in Rehab in [place] place. Feels like it is sciatical. Pt radiates along the sciatic nerve and R L 5 dermatome. Pt is healing fine as far as her R hip surgery is concerned. Also has R medial knee pain due to arthritis. Had home health PT which involves seated knee extension, hip flexion, standing hip abduction, extension. Pt did some walking wiht a rw. Feels a lot of pain all over. Walking too far is limited due to her O2 situation.  PRECAUTIONS: SpO2 not under 90 %  SUBJECTIVE: R knee and L shoulder are sore. No back pain currently. Does not need lidocaine anymore to go to bed at night.     PAIN:  Are you having pain? 0/10 back pain  currently. 0/10 R knee soreness, 3/10 L shoulder soreness currently.      TODAY'S TREATMENT:   Therapeutic exercise  98% SpO2, 3 L  4L O2  Gait with rw 120 ft. 84% SpO2 to 96% after about 2 minutes rest  Standing B scapular retraction 10x 5 seconds   Then 4 L O2 Standing with B UE assist  Hip abduction    R 10x   L 10x    92-96% SpO2 (on 4 L O2),     Then Hip abduction after rest   R 10x   L 10x    98% SpO2 4 L   Then Hip abduction after rest   R 10x   L 10x      91- 97%  SpO2 4L    Hip extension    R 10x   L 10x    92% to 97% at 4 L O2   Standing B shoulder low rows yellow band 10x3  96% 99% SpO2  Seated manually resisted trunk flexion isometrics in neutral, PT manually resisted 10x5 seconds for 2 sets  95% to 98% SpO2 on 4 L    Improved exercise technique, movement at target joints, use of target muscles after mod verbal, visual, tactile cues.      Response to treatment Pt tolerated session well without aggravation of symptoms.      Clinical impression   Pt demonstrates centralization of symptoms with R LE pain decrasing to 3/10 overall at most for the past 7 days. Pt also demonstrates improved function based on her current FOTO score since initial evaluation. Continued working on predominantly standing exercises to improve standing tolerance. Continued working on trunk and glute strengthening as well  as thoracic extension to decrease stress to low back. Pt tolerated session well without aggravation of symptoms.  Challenges to progress include age and pulmonary fibrosis, limiting her exercise tolerance. Pt will benefit from continued skilled physical therapy services to decrease pain, improve strength, gait, and function.      PATIENT EDUCATION: Education details: ther-ex, HEP Person educated: Patient Education method: Explanation, Demonstration, Tactile cues, Verbal cues, and Handouts Education comprehension: verbalized understanding and  returned demonstration   HOME EXERCISE PROGRAM: Access Code: [email or web address] URL: [email or web address] Date: 01/20/2022 Prepared by: Joneen Boers  Exercises - Seated Transversus Abdominis Bracing  - 1 x daily - 7 x weekly - 3 sets - 10 reps - 5 seconds hold - Seated Gluteal Sets  - 1 x daily - 7 x weekly - 3 sets - 10 reps - 5 seconds hold - Seated Scapular Retraction  - 1 x daily - 7 x weekly - 3 sets - 10 reps - 5 seconds  hold  Can perform aforementioned exercises in standing  now.    - Seated Hip Abduction with Resistance  - 1 x daily - 7 x weekly - 2 sets - 10 reps - Seated Hip Adduction Isometrics with Ball  - 1 x daily - 7 x weekly - 2 sets - 10 reps - 5 seconds hold - Standing Hip Abduction with Counter Support  - 1 x daily - 7 x weekly - 3 sets - 10 reps    PT Short Term Goals - 02/16/22 0941       PT SHORT TERM GOAL #1   Title Pt will be independent with her initial HEP to decrease pain, improve strength, function, and ability to stand and ambulate more comfortably.    Baseline No questions with HEP, has been doing them (02/16/2022)    Time 3    Period Weeks    Status Achieved    Target Date 02/05/22              PT Long Term Goals - 02/16/22 0942       PT LONG TERM GOAL #1   Title Pt will have a decrease in low back pain to 3/10 or less at worst to promote ability to stand and ambulate with less difficulty.    Baseline 6/10 low back pain at worst for the past 2 months (01/12/2022); 7/10 at most for the past 7 days (standing in the kitchen, more standing than normal becuase pt had guests over from [facility]) ( 02/16/2022)    Time 8    Period Weeks    Status On-going    Target Date 03/12/22      PT LONG TERM GOAL #2   Title Pt will have a decrease in R LE pain to 3/10 or less at worst to promote ability to stand and ambulate will less difficulty.    Baseline 9/10 R LE pain at worst (01/12/2022); 3/10 R sciatic and L5 dermatome pain at most for the past 7  days; has R > L heel pain however which wakes her up at night. (02/16/2022)    Time 8    Period Weeks    Status Partially Met    Target Date 03/12/22      PT LONG TERM GOAL #3   Title Pt will improve her FOTO score by at least 10 points as a demontsration of improved function.    Baseline Lumbar Spine FOTO 39 (01/12/2022); 46 (02/16/2022)    Time 8    Period Weeks    Status Partially Met    Target Date 03/12/22      PT LONG TERM GOAL #4   Title Pt will be able to ambulate at least 100 ft independently to promote mobility and return to prior level of function.    Baseline Able to ambulate with rw short distance, about 40 ft with reproduction of pain. (01/12/2022); able to ambulate 120 ft with rw, SBA (02/16/2022)    Time 8    Period Weeks    Status On-going    Target Date 03/12/22              Plan - 02/16/22 1351     Clinical Impression Statement Pt demonstrates centralization of symptoms with R LE pain decrasing to 3/10 overall at most for the past 7 days. Pt also demonstrates improved function based on her current FOTO score since initial evaluation. Continued working on predominantly standing exercises to improve standing tolerance. Continued working on trunk and glute strengthening as well  as  thoracic extension to decrease stress to low back. Pt tolerated session well without aggravation of symptoms.  Challenges to progress include age and pulmonary fibrosis, limiting her exercise tolerance. Pt will benefit from continued skilled physical therapy services to decrease pain, improve strength, gait, and function.    Personal Factors and Comorbidities Comorbidity 3+;Age;Time since onset of injury/illness/exacerbation;Fitness    Comorbidities Arthritis, atrial fibrillation, HTN, idiopathic pulmonary fibrosis    Examination-Activity Limitations Stairs;Bed Mobility;Stand;Sit;Lift;Transfers;Locomotion Level;Carry    Stability/Clinical Decision Making Stable/Uncomplicated    Rehab Potential Fair     PT Frequency 2x / week    PT Duration 8 weeks    PT Treatment/Interventions Therapeutic activities;Therapeutic exercise;Gait training;Balance training;Neuromuscular re-education;Patient/family education;Manual techniques;Dry needling;Electrical Stimulation;Iontophoresis 30m/ml Dexamethasone    PT Next Visit Plan Posture, thoracic extension, trunk and glute strength, lumbopelvic control during gait, manual techniques, modalities PRN    Consulted and Agree with Plan of Care Patient                Thank you for your referral.   MJoneen BoersPT, DPT  02/16/2022, 1:58 PM

## 2022-02-25 ENCOUNTER — Ambulatory Visit: Payer: Medicare PPO

## 2022-02-25 DIAGNOSIS — M5417 Radiculopathy, lumbosacral region: Secondary | ICD-10-CM

## 2022-02-25 DIAGNOSIS — Z9181 History of falling: Secondary | ICD-10-CM

## 2022-02-25 DIAGNOSIS — R262 Difficulty in walking, not elsewhere classified: Secondary | ICD-10-CM

## 2022-02-25 DIAGNOSIS — M6281 Muscle weakness (generalized): Secondary | ICD-10-CM

## 2022-02-25 DIAGNOSIS — M5459 Other low back pain: Secondary | ICD-10-CM

## 2022-02-25 DIAGNOSIS — M5431 Sciatica, right side: Secondary | ICD-10-CM

## 2022-02-25 NOTE — Therapy (Signed)
OUTPATIENT PHYSICAL THERAPY TREATMENT NOTE    Patient Name: Joyce Evans MRN: [id number] DOB:1941/05/09, 81 y.o., female Today's Date: 02/25/2022  PCP: Derinda Late, MD  REFERRING PROVIDER: Phylliss Bob, MD   PT End of Session - 02/25/22 1107     Visit Number 11    Number of Visits 17    Date for PT Re-Evaluation 03/12/22    Authorization Type [employer]    Authorization Time Period cert through March 12, 2022    Progress Note Due on Visit 10    PT Start Time 1105    PT Stop Time 1150    PT Time Calculation (min) 45 min    Activity Tolerance Patient tolerated treatment well;No increased pain    Behavior During Therapy [facility] for tasks assessed/performed                  Past Medical History:  Diagnosis Date   Arthritis    osteo - shoulders, fingers   Atrial fibrillation ([place])    Dizziness    thinks because of diuretic   Family history of adverse reaction to anesthesia    daughter -PONV   Gallstones 2016, 2017   GERD (gastroesophageal reflux disease)    Glaucoma    Heart murmur    history of   Hypertension    Melanoma ([place]) 04/20/2016   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 Excised: 05/19/2016, margins free.   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   Tricuspid valve disorder    leak   Past Surgical History:  Procedure Laterality Date   ABDOMINAL HYSTERECTOMY  1987   partial   CARDIAC CATHETERIZATION  2010   [facility]   CATARACT EXTRACTION W/PHACO Right 08/28/2015   Procedure: CATARACT EXTRACTION PHACO AND INTRAOCULAR LENS PLACEMENT ([place]);  Surgeon: Leandrew Koyanagi, MD;  Location: [place];  Service: Ophthalmology;  Laterality: Right;  TORIC   INTRAMEDULLARY (IM) NAIL INTERTROCHANTERIC Right 10/16/2021   Procedure: INTRAMEDULLARY (IM) NAIL INTERTROCHANTRIC;  Surgeon: Renee Harder, MD;  Location: [facility];  Service: Orthopedics;  Laterality: Right;   MELANOMA EXCISION Left 05/19/2016   MIS. Left mid. lat. tricep. 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 S/P IM nail placement for right intertrochanteric hip fracture with Dr. Sharlet Salina on 10/16/2021. Pt slipped on the kithen floor. Pt was getting something from the microwave and pt tripped on her O2 tube. Pt was independent with ambulation prior to her fall. Pt fell before in the woods and pt tripped over a root 3 years ago (no injuries per pt). Pt has been using supplemental O2 for the past 2 years in November. Pt has interstitial lung disease (idiopathic pulmonary fibrosis). Recent breathing test is stable. Back pain began when pt was in Rehab  in [place] place. Feels like it is sciatical. Pt radiates along the sciatic nerve and R L 5 dermatome. Pt is healing fine as far as her R hip surgery is concerned. Also has R medial knee pain due to arthritis. Had home health PT which involves seated knee extension, hip flexion, standing hip abduction, extension. Pt did some walking wiht a rw. Feels a lot of pain all over. Walking too far is limited due to her O2 situation.  PRECAUTIONS: SpO2 not under 90 %  SUBJECTIVE: Been at sister's in [place] when husband was out of town. No falls, some minor hip and knee pain thinks due to weather. Been compliant with HEP.     PAIN:  Are you having pain? None  currently    TODAY'S TREATMENT: 02/25/22  98% SpO2, 3 L at rest  Titrated to 4L/min O2 via [place] for gait and exercise.    Therapeutic exercise:   Standing hip abduction: BLE's, x10. 2# AW on RLE no resistance LLE, 3x6. Noted decreased stance time on RLE thus warranting no resistance on LLE. SBA with BUE's on support bar  Standing hip extension: 3x6/LE with 2# AW's on BLE's. VC's for larger amplitudes through ranges of motion. SBAwith BUE's on support bar.    Gait training:   2x100' with RW supervision. Noted consistent step through gait with very little need for UE's on RW for support. Seated rest b/t bouts with desat to ~85-87% b/t bouts that recovers >95% after 1-2 min seated PLB. Education provided on trialing SPC as pt's end goals are indep ambulation.    X6 bouts of SPC use in LUE in 20 meter distances with seated rest after each bout. PT education and demo on 3 point step to pattern, use of SPC in appropriate UE. CGA and gait belt used. Pt performing all bouts with adequate step to gait with good stability only requiring very mild VC's for [facility] placement closer to pt's BOS with good carryover after cues. Mild desat on 4L/min to 86-87% that returns >95% with seated rest and PLB in < 30 seconds/bout. Education on maintained use of RW for short community distances for stability but pt demonstrates adequate, safe use of SPC to progress to [facility] training in clinic. Pt in agreement verbalizing understanding.     use of target muscles after mod verbal, visual, tactile cues.      Response to treatment Pt tolerated session well without aggravation of symptoms.      Clinical impression Progression of gait training today to [facility] use. Education, demonstration and practice performed today as pt's goals are to return to independent ambulation. Therapeutic seated rests provided throughout due to fibrosis. Pt demonstrating excellent understanding and performance of 3 point step to gait with SPC with  correct sequencing and no evidence of imbalance. Pt reports mild unsteadiness but believes this is due to first time practicing. Pt encouraged to utilize RW for unfamiliar environments and short community distances. Pt displaying ability to progress to reduced support with ambulatory activities to pt tolerance. No pain reported throughout ambulatory tasks. Pt will benefit from continued skilled physical therapy services to decrease pain, improve strength, gait, and function.      PATIENT EDUCATION: Education details: ther-ex, gait training with SPC. Person educated: Patient Education method: Explanation, Demonstration, Tactile cues, Verbal cues, and Handouts Education comprehension: verbalized understanding and returned demonstration   HOME EXERCISE PROGRAM: Access Code: [email or web address] URL: [email or web address] Date: 01/20/2022 Prepared by: Joneen Boers  Exercises - Seated Transversus Abdominis Bracing  -  1 x daily - 7 x weekly - 3 sets - 10 reps - 5 seconds hold - Seated Gluteal Sets  - 1 x daily - 7 x weekly - 3 sets - 10 reps - 5 seconds hold - Seated Scapular Retraction  - 1 x daily - 7 x weekly - 3 sets - 10 reps - 5 seconds  hold  Can perform aforementioned exercises in standing now.    - Seated Hip Abduction with Resistance  - 1 x daily - 7 x weekly - 2 sets - 10 reps - Seated Hip Adduction Isometrics with Ball  - 1 x daily - 7 x weekly - 2 sets - 10 reps - 5 seconds hold - Standing Hip Abduction with Counter Support  - 1 x daily - 7 x weekly - 3 sets - 10 reps    PT Short Term Goals - 02/16/22 0941       PT SHORT TERM GOAL #1   Title Pt will be independent with her initial HEP to decrease pain, improve strength, function, and ability to stand and ambulate more comfortably.    Baseline No questions with HEP, has been doing them (02/16/2022)    Time 3    Period Weeks    Status Achieved    Target Date 02/05/22              PT Long Term Goals - 02/16/22 0942        PT LONG TERM GOAL #1   Title Pt will have a decrease in low back pain to 3/10 or less at worst to promote ability to stand and ambulate with less difficulty.    Baseline 6/10 low back pain at worst for the past 2 months (01/12/2022); 7/10 at most for the past 7 days (standing in the kitchen, more standing than normal becuase pt had guests over from [facility]) ( 02/16/2022)    Time 8    Period Weeks    Status On-going    Target Date 03/12/22      PT LONG TERM GOAL #2   Title Pt will have a decrease in R LE pain to 3/10 or less at worst to promote ability to stand and ambulate will less difficulty.    Baseline 9/10 R LE pain at worst (01/12/2022); 3/10 R sciatic and L5 dermatome pain at most for the past 7 days; has R > L heel pain however which wakes her up at night. (02/16/2022)    Time 8    Period Weeks    Status Partially Met    Target Date 03/12/22      PT LONG TERM GOAL #3   Title Pt will improve her FOTO score by at least 10 points as a demontsration of improved function.    Baseline Lumbar Spine FOTO 39 (01/12/2022); 46 (02/16/2022)    Time 8    Period Weeks    Status Partially Met    Target Date 03/12/22      PT LONG TERM GOAL #4   Title Pt will be able to ambulate at least 100 ft independently to promote mobility and return to prior level of function.    Baseline Able to ambulate with rw short distance, about 40 ft with reproduction of pain. (01/12/2022); able to ambulate 120 ft with rw, SBA (02/16/2022)    Time 8    Period Weeks    Status On-going    Target Date 03/12/22  Salem Caster. Fairly IV, PT, DPT Physical Therapist- [facility]  02/25/2022, 12:23 PM

## 2022-02-26 ENCOUNTER — Encounter: Payer: Medicare PPO | Admitting: Physician Assistant

## 2022-02-26 DIAGNOSIS — L97822 Non-pressure chronic ulcer of other part of left lower leg with fat layer exposed: Secondary | ICD-10-CM | POA: Diagnosis not present

## 2022-02-27 NOTE — Progress Notes (Signed)
DONZELLA, CARROL ([id number]) Visit Report for 02/26/2022 Chief Complaint Document Details Patient Name: Joyce Evans, Joyce Evans Date of Service: 02/26/2022 9:00 AM Medical Record Number: [id number] Patient Account Number: [id number] Date of Birth/Sex: [birth year] (81 y.o. F) Treating RN: Cornell Barman Primary Care Provider: Derinda Late Other Clinician: Massie Kluver Referring Provider: Derinda Late Treating Provider/Extender: Skipper Cliche in Treatment: 5 Information Obtained from: Patient Chief Complaint Left LE Ulcer Electronic Signature(s) Signed: 02/26/2022 9:13:53 AM By: Worthy Keeler PA-C Entered By: Worthy Keeler on 02/26/2022 09:13:53 Joyce Evans ([id number]) -------------------------------------------------------------------------------- HPI Details Patient Name: Joyce Evans Date of Service: 02/26/2022 9:00 AM Medical Record Number: [id number] Patient Account Number: [id number] Date of Birth/Sex: [birth year] (81 y.o. F) Treating RN: Cornell Barman Primary Care Provider: Derinda Late Other Clinician: Massie Kluver Referring Provider: Derinda Late Treating Provider/Extender: Skipper Cliche in Treatment: 5 History of Present Illness HPI Description: 01-16-2022 upon evaluation patient appears for initial evaluation here in the clinic today concerning issues that she has been having with a wound over the anterior portion of her left lower extremity. This is a laceration that occurred on 12-11-2021. She states that she hit this on a car door. Subsequently she sustained a skin tear which has been difficult to heal since that point. No sutures have been placed. She has been using Neosporin and try to leave it open to air which was the recommendation from the physician that she saw. With that being said she tells me that its been difficult to get this to heal and she subsequently ended up with an appointment here in order for [place] to see if there is anything we can  recommend to speed up the healing process. She does have long-term use of prednisone due to interstitial lung disease as well as taking fluid pills for lower extremity swelling. Her swelling is not too bad today and to be honest I think it may be better for her to be able to change the dressing more frequently than have a compression wrap on currently. We may need to wrap her though if we do not see the improvement that we really want to see going forward we will keep a close eye on things here. Patient has a history of interstitial lung disease, hypertension, chronic kidney disease stage III, atrial fibrillation, long-term use of anticoagulant therapy, and dependence on supplemental oxygen. She is also on prednisone long-term for the lung disease. 01-26-2022 upon evaluation today patient's wound is actually showing signs of excellent improvement. In just 1 week's time we have seen a dramatic improvement in the overall status of the wound we did remove the necrotic skin flap and this has started to fill in quite nicely. Fortunately I do not see any evidence of active infection locally or systemically at this time which is great news. No fevers, chills, nausea, vomiting, or diarrhea. 7/4 initially traumatic wound on her left anterior lower leg. Patient has chronic edema and uses compression stockings. Her wound was caused by a car door injury 02-09-2022 upon evaluation today patient appears to be doing well currently in regard to her wound although this is definitely very dry the collagen is getting stuck in the wound bed. Fortunately I do not see any evidence of active infection locally or systemically which is great news. No fevers, chills, nausea, vomiting, or diarrhea. 02-16-2022 upon evaluation today patient appears to be doing well with regard to her wounds. She has been tolerating the dressing changes without complication. Fortunately there does  not appear to be any evidence of active infection  locally or systemically at this time which is great news. No fevers, chills, nausea, vomiting, or diarrhea. 02-26-2022 upon evaluation today patient appears to be doing excellent in fact the wound appears to be completely healed which is excellent news. I do not see any signs of active infection at this time. Electronic Signature(s) Signed: 02/26/2022 5:12:15 PM By: Worthy Keeler PA-C Entered By: Worthy Keeler on 02/26/2022 17:12:14 Joyce Evans ([id number]) -------------------------------------------------------------------------------- Physical Exam Details Patient Name: Joyce Evans Date of Service: 02/26/2022 9:00 AM Medical Record Number: [id number] Patient Account Number: [id number] Date of Birth/Sex: Jul 04, 1941 (81 y.o. F) Treating RN: Cornell Barman Primary Care Provider: Derinda Late Other Clinician: Massie Kluver Referring Provider: Derinda Late Treating Provider/Extender: Skipper Cliche in Treatment: 5 Constitutional Well-nourished and well-hydrated in no acute distress. Respiratory normal breathing without difficulty. Psychiatric this patient is able to make decisions and demonstrates good insight into disease process. Alert and Oriented x 3. pleasant and cooperative. Notes Patient's wound bed showed complete epithelization which is great news and overall I am extremely pleased with where we stand. I do believe that she is ready for discharge. Electronic Signature(s) Signed: 02/26/2022 5:12:24 PM By: Worthy Keeler PA-C Entered By: Worthy Keeler on 02/26/2022 17:12:24 Joyce Evans ([id number]) -------------------------------------------------------------------------------- Physician Orders Details Patient Name: Joyce Evans Date of Service: 02/26/2022 9:00 AM Medical Record Number: [id number] Patient Account Number: [id number] Date of Birth/Sex: 1940/11/14 (81 y.o. 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 Put garments on first thing when you wake up and remove them before bed. o Moisturize legs daily after removing compression garments. - lotion or AandD ointment Electronic Signature(s) Signed: 02/26/2022 3:11:41 PM By: Massie Kluver Signed: 02/26/2022 5:21:19 PM By: Worthy Keeler PA-C Entered By: Massie Kluver on 02/26/2022 09:27:14 Joyce Evans ([id number]) -------------------------------------------------------------------------------- Problem List Details Patient Name: Joyce Evans Date of Service: 02/26/2022 9:00 AM Medical Record Number: [id number] Patient Account Number: [id number] Date of Birth/Sex: 11/18/1940 (81 y.o. F) Treating RN: Cornell Barman Primary Care Provider: Derinda Late Other Clinician: Massie Kluver Referring Provider: Derinda Late Treating Provider/Extender: Skipper Cliche in Treatment: 5 Active Problems ICD-10 Encounter Code Description Active Date MDM Diagnosis S81.812A Laceration without foreign body, left lower leg, initial encounter 01/16/2022 No Yes L97.822 Non-pressure chronic ulcer of  other part of left lower leg with fat layer 01/16/2022 No Yes exposed J84.170 Interstitial lung disease with progressive fibrotic phenotype in diseases 01/16/2022 No Yes classified elsewhere I10 Essential (primary) hypertension 01/16/2022 No Yes N18.30 Chronic kidney disease, stage 3 unspecified 01/16/2022 No Yes I48.0 Paroxysmal atrial fibrillation 01/16/2022 No Yes Z79.01 Long term (current) use of anticoagulants 01/16/2022 No Yes Z99.81 Dependence on supplemental oxygen 01/16/2022 No Yes Inactive Problems Resolved  Problems Electronic Signature(s) Signed: 02/26/2022 9:13:48 AM By: Worthy Keeler PA-C Entered By: Worthy Keeler on 02/26/2022 09:13:48 Joyce Evans ([id number]) -------------------------------------------------------------------------------- Progress Note Details Patient Name: Joyce Evans Date of Service: 02/26/2022 9:00 AM Medical Record Number: [id number] Patient Account Number: [id number] Date of Birth/Sex: 1940-09-04 (81 y.o. F) Treating RN: Cornell Barman Primary Care Provider: Derinda Late Other Clinician: Massie Kluver Referring Provider: Derinda Late Treating Provider/Extender: Skipper Cliche in Treatment: 5 Subjective Chief Complaint Information obtained from Patient Left LE Ulcer History of Present Illness (HPI) 01-16-2022 upon evaluation patient appears for initial evaluation here in the clinic today concerning issues that she has been having with a wound over the anterior portion of her left lower extremity. This is a laceration that occurred on 12-11-2021. She states that she hit this on a car door. Subsequently she sustained a skin tear which has been difficult to heal since that point. No sutures have been placed. She has been using Neosporin and try to leave it open to air which was the recommendation from the physician that she saw. With that being said she tells me that its been difficult to get this to heal and she subsequently ended up with an  appointment here in order for [place] to see if there is anything we can recommend to speed up the healing process. She does have long-term use of prednisone due to interstitial lung disease as well as taking fluid pills for lower extremity swelling. Her swelling is not too bad today and to be honest I think it may be better for her to be able to change the dressing more frequently than have a compression wrap on currently. We may need to wrap her though if we do not see the improvement that we really want to see going forward we will keep a close eye on things here. Patient has a history of interstitial lung disease, hypertension, chronic kidney disease stage III, atrial fibrillation, long-term use of anticoagulant therapy, and dependence on supplemental oxygen. She is also on prednisone long-term for the lung disease. 01-26-2022 upon evaluation today patient's wound is actually showing signs of excellent improvement. In just 1 week's time we have seen a dramatic improvement in the overall status of the wound we did remove the necrotic skin flap and this has started to fill in quite nicely. Fortunately I do not see any evidence of active infection locally or systemically at this time which is great news. No fevers, chills, nausea, vomiting, or diarrhea. 7/4 initially traumatic wound on her left anterior lower leg. Patient has chronic edema and uses compression stockings. Her wound was caused by a car door injury 02-09-2022 upon evaluation today patient appears to be doing well currently in regard to her wound although this is definitely very dry the collagen is getting stuck in the wound bed. Fortunately I do not see any evidence of active infection locally or systemically which is great news. No fevers, chills, nausea, vomiting, or diarrhea. 02-16-2022 upon evaluation today patient appears to be doing well with regard to her wounds. She has been tolerating the dressing changes without complication.  Fortunately there does not appear to be any evidence of active infection locally or systemically at this time which is great news. No fevers, chills, nausea, vomiting, or diarrhea. 02-26-2022 upon evaluation today patient appears to be doing excellent in fact the wound appears to be completely healed which is excellent news. I do not see any signs of active infection at this  time. Objective Constitutional Well-nourished and well-hydrated in no acute distress. Vitals Time Taken: 9:03 AM, Height: 60 in, Temperature: 97.9 F, Pulse: 75 bpm, Respiratory Rate: 18 breaths/min, Blood Pressure: 137/75 mmHg. Respiratory normal breathing without difficulty. Psychiatric this patient is able to make decisions and demonstrates good insight into disease process. Alert and Oriented x 3. pleasant and cooperative. General Notes: Patient's wound bed showed complete epithelization which is great news and overall I am extremely pleased with where we stand. I do believe that she is ready for discharge. ACASIA, SKILTON ([id number]) Integumentary (Hair, Skin) Wound #1 status is Healed - Epithelialized. Original cause of wound was Trauma. The date acquired was: 12/11/2021. The wound has been in treatment 5 weeks. The wound is located on the Left,Midline Lower Leg. The wound measures 0cm length x 0cm width x 0cm depth; 0cm^2 area and 0cm^3 volume. There is no tunneling noted. There is a none present amount of drainage noted. The wound margin is distinct with the outline attached to the wound base. There is no granulation within the wound bed. There is no necrotic tissue within the wound bed. 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 Put garments on first thing when you wake up and remove them before bed. Moisturize legs daily after removing compression garments. - lotion or AandD ointment 1. I am going to suggest currently that we go ahead and discontinue wound care measures as the patient is completely healed. She should be wearing her compression garments daily and use some AandD ointment over the area in order to protect the newly healed skin. 2. I am also can recommend that continue to monitor for any signs of worsening or infection if anything changes they should contact the office and let me know. We will see patient back for reevaluation in 1 week here in the clinic. If anything worsens or changes patient will contact our office for additional recommendations. Electronic Signature(s) Signed: 02/26/2022 5:12:52 PM By: Worthy Keeler PA-C Entered By: Worthy Keeler on 02/26/2022 17:12:52 Joyce Evans ([id number]) -------------------------------------------------------------------------------- SuperBill Details Patient Name: Joyce Evans Date of Service: 02/26/2022 Medical Record Number: [id number] Patient Account Number: [id number] Date of Birth/Sex: October 02, 1940 (81 y.o. F) Treating RN: Cornell Barman Primary Care Provider: Derinda Late Other Clinician: Massie Kluver Referring Provider: Derinda Late Treating Provider/Extender: Skipper Cliche in Treatment: 5 Diagnosis Coding ICD-10 Codes Code Description [phone] Laceration without foreign body, left lower leg, initial encounter L97.822 Non-pressure chronic ulcer of other part of left lower leg with fat layer exposed J84.170 Interstitial  lung disease with progressive fibrotic phenotype in diseases classified elsewhere I10 Essential (primary) hypertension N18.30 Chronic kidney disease, stage 3 unspecified I48.0 Paroxysmal atrial fibrillation Z79.01 Long term (current) use of anticoagulants Z99.81 Dependence on supplemental oxygen Facility Procedures CPT4 Code: [id number] Description: [phone] - WOUND CARE VISIT-LEV 2 EST PT Modifier: Quantity: 1 Physician Procedures CPT4 Code: [id number] Description: 99213 - WC PHYS LEVEL 3 - EST PT Modifier: Quantity: 1 CPT4 Code: Description: ICD-10 Diagnosis Description S81.812A Laceration without foreign body,  left lower leg, initial encounter L97.822 Non-pressure chronic ulcer of other part of left lower leg with fat layer J84.170 Interstitial lung disease with progressive  fibrotic phenotype in diseases I10 Essential (primary) hypertension Modifier: exposed classified elsewhere Quantity: Electronic Signature(s) Signed: 02/26/2022 5:15:06 PM By: Worthy Keeler PA-C Previous Signature: 02/26/2022 3:11:41 PM Version By: Massie Kluver Entered By: Worthy Keeler on 02/26/2022 17:15:06

## 2022-02-28 NOTE — Progress Notes (Signed)
Joyce, Evans ([id number]) Visit Report for 02/26/2022 Arrival Information Details Patient Name: Joyce Evans, Joyce Evans Date of Service: 02/26/2022 9:00 AM Medical Record Number: [id number] Patient Account Number: [id number] Date of Birth/Sex: 09-14-1940 (80 y.o. F) Treating RN: Cornell Barman Primary Care Jarron Curley: Derinda Late Other Clinician: Massie Kluver Referring Kristin Barcus: Derinda Late Treating Orlyn Odonoghue/Extender: Skipper Cliche in Treatment: 5 Visit Information History Since Last Visit All ordered tests and consults were completed: No Patient Arrived: Wheel Chair Added or deleted any medications: No Arrival Time: 08:58 Any new allergies or adverse reactions: No Transfer Assistance: EasyPivot Patient Lift Had a fall or experienced change in No Patient Requires Transmission-Based No activities of daily living that may affect Precautions: risk of falls: Patient Has Alerts: Yes Hospitalized since last visit: No Patient Alerts: Patient on Blood Pain Present Now: No Thinner Eliquis NOT DIABETIC Electronic Signature(s) Signed: 02/26/2022 3:11:41 PM By: Massie Kluver Entered By: Massie Kluver on 02/26/2022 09:03:11 Schultes, Candie Echevaria ([id number]) -------------------------------------------------------------------------------- Clinic Level of Care Assessment Details Patient Name: Joyce Evans Date of Service: 02/26/2022 9:00 AM Medical Record Number: [id number] Patient Account Number: [id number] Date of Birth/Sex: [birth year] (80 y.o. F) Treating RN: Cornell Barman Primary Care Monique Hefty: Derinda Late Other Clinician: Massie Kluver Referring Mana Morison: Derinda Late Treating Jazzlene Huot/Extender: Skipper Cliche in Treatment: 5 Clinic Level of Care Assessment Items TOOL 4 Quantity Score '[]'$  - Use when only an EandM is performed on FOLLOW-UP visit 0 ASSESSMENTS - Nursing Assessment / Reassessment X - Reassessment of Co-morbidities (includes updates in patient status) 1  10 X- 1 5 Reassessment of Adherence to Treatment Plan ASSESSMENTS - Wound and Skin Assessment / Reassessment X - Simple Wound Assessment / Reassessment - one wound 1 5 '[]'$  - 0 Complex Wound Assessment / Reassessment - multiple wounds '[]'$  - 0 Dermatologic / Skin Assessment (not related to wound area) ASSESSMENTS - Focused Assessment '[]'$  - Circumferential Edema Measurements - multi extremities 0 '[]'$  - 0 Nutritional Assessment / Counseling / Intervention '[]'$  - 0 Lower Extremity Assessment (monofilament, tuning fork, pulses) '[]'$  - 0 Peripheral Arterial Disease Assessment (using hand held doppler) ASSESSMENTS - Ostomy and/or Continence Assessment and Care '[]'$  - Incontinence Assessment and Management 0 '[]'$  - 0 Ostomy Care Assessment and Management (repouching, etc.) PROCESS - Coordination of Care X - Simple Patient / Family Education for ongoing care 1 15 '[]'$  - 0 Complex (extensive) Patient / Family Education for ongoing care '[]'$  - 0 Staff obtains Programmer, systems, Records, Test Results / Process Orders '[]'$  - 0 Staff telephones HHA, Nursing Homes / Clarify orders / etc '[]'$  - 0 Routine Transfer to another Facility (non-emergent condition) '[]'$  - 0 Routine Hospital Admission (non-emergent condition) '[]'$  - 0 New Admissions / Biomedical engineer / Ordering NPWT, Apligraf, etc. '[]'$  - 0 Emergency Hospital Admission (emergent condition) X- 1 10 Simple Discharge Coordination '[]'$  - 0 Complex (extensive) Discharge Coordination PROCESS - Special Needs '[]'$  - Pediatric / Minor Patient Management 0 '[]'$  - 0 Isolation Patient Management '[]'$  - 0 Hearing / Language / Visual special needs '[]'$  - 0 Assessment of Community assistance (transportation, D/C planning, etc.) '[]'$  - 0 Additional assistance / Altered mentation '[]'$  - 0 Support Surface(s) Assessment (bed, cushion, seat, etc.) INTERVENTIONS - Wound Cleansing / Measurement Ramires, Qamar P. ([id number]) X- 1 5 Simple Wound Cleansing - one wound '[]'$  - 0 Complex  Wound Cleansing - multiple wounds X- 1 5 Wound Imaging (photographs - any number of wounds) '[]'$  - 0 Wound Tracing (instead of photographs) '[]'$  - 0  Simple Wound Measurement - one wound '[]'$  - 0 Complex Wound Measurement - multiple wounds INTERVENTIONS - Wound Dressings '[]'$  - Small Wound Dressing one or multiple wounds 0 '[]'$  - 0 Medium Wound Dressing one or multiple wounds '[]'$  - 0 Large Wound Dressing one or multiple wounds X- 1 5 Application of Medications - topical '[]'$  - 0 Application of Medications - injection INTERVENTIONS - Miscellaneous '[]'$  - External ear exam 0 '[]'$  - 0 Specimen Collection (cultures, biopsies, blood, body fluids, etc.) '[]'$  - 0 Specimen(s) / Culture(s) sent or taken to Lab for analysis '[]'$  - 0 Patient Transfer (multiple staff / Civil Service fast streamer / Similar devices) '[]'$  - 0 Simple Staple / Suture removal (25 or less) '[]'$  - 0 Complex Staple / Suture removal (26 or more) '[]'$  - 0 Hypo / Hyperglycemic Management (close monitor of Blood Glucose) '[]'$  - 0 Ankle / Brachial Index (ABI) - do not check if billed separately X- 1 5 Vital Signs Has the patient been seen at the hospital within the last three years: Yes Total Score: 65 Level Of Care: New/Established - Level 2 Electronic Signature(s) Signed: 02/26/2022 3:11:41 PM By: Massie Kluver Entered By: Massie Kluver on 02/26/2022 09:27:50 Bazzle, Candie Echevaria ([id number]) -------------------------------------------------------------------------------- Encounter Discharge Information Details Patient Name: Joyce Evans Date of Service: 02/26/2022 9:00 AM Medical Record Number: [id number] Patient Account Number: [id number] Date of Birth/Sex: 14-Feb-1941 (80 y.o. F) Treating RN: Cornell Barman Primary Care Jatavia Keltner: Derinda Late Other Clinician: Massie Kluver Referring Lateia Fraser: Derinda Late Treating Caitlen Worth/Extender: Skipper Cliche in Treatment: 5 Encounter Discharge Information Items Discharge Condition:  Stable Ambulatory Status: Wheelchair Discharge Destination: Home Transportation: Private Auto Accompanied By: husband Schedule Follow-up Appointment: Yes Clinical Summary of Care: Electronic Signature(s) Signed: 02/26/2022 3:11:41 PM By: Massie Kluver Entered By: Massie Kluver on 02/26/2022 09:29:22 Joyce Evans ([id number]) -------------------------------------------------------------------------------- Lower Extremity Assessment Details Patient Name: Joyce Evans Date of Service: 02/26/2022 9:00 AM Medical Record Number: [id number] Patient Account Number: [id number] Date of Birth/Sex: 1941/01/19 (80 y.o. F) Treating RN: Cornell Barman Primary Care Adanna Zuckerman: Derinda Late Other Clinician: Massie Kluver Referring Gregorio Worley: Derinda Late Treating Holden Draughon/Extender: Skipper Cliche in Treatment: 5 Edema Assessment Assessed: [Left: Yes] [Right: No] Edema: [Left: Ye] [Right: s] Calf Left: Right: Point of Measurement: From Medial Instep 39.4 cm Ankle Left: Right: Point of Measurement: From Medial Instep 22.5 cm Vascular Assessment Pulses: Dorsalis Pedis Palpable: [Left:Yes] Posterior Tibial Palpable: [Left:Yes] Electronic Signature(s) Signed: 02/26/2022 3:11:41 PM By: Massie Kluver Signed: 02/26/2022 5:20:01 PM By: Gretta Cool, BSN, RN, CWS, Kim RN, BSN Entered By: Massie Kluver on 02/26/2022 09:12:25 Joyce Evans ([id number]) -------------------------------------------------------------------------------- Multi Wound Chart Details Patient Name: Joyce Evans Date of Service: 02/26/2022 9:00 AM Medical Record Number: [id number] Patient Account Number: [id number] Date of Birth/Sex: 1940-11-22 (80 y.o. F) Treating RN: Cornell Barman Primary Care Carson Bogden: Derinda Late Other Clinician: Massie Kluver Referring Slevin Gunby: Derinda Late Treating Margaretann Abate/Extender: Skipper Cliche in Treatment: 5 Vital Signs Height(in): 60 Pulse(bpm): 75 Weight(lbs): Blood  Pressure(mmHg): 137/75 Body Mass Index(BMI): Temperature(F): 97.9 Respiratory Rate(breaths/min): 18 Photos: [N/A:N/A] Wound Location: Left, Midline Lower Leg N/A N/A Wounding Event: Trauma N/A N/A Primary Etiology: Trauma, Other N/A N/A Comorbid History: Glaucoma, Arrhythmia, N/A N/A Hypertension, End Stage Renal Disease Date Acquired: 12/11/2021 N/A N/A Weeks of Treatment: 5 N/A N/A Wound Status: Healed - Epithelialized N/A N/A Wound Recurrence: No N/A N/A Measurements L x W x D (cm) 0x0x0 N/A N/A Area (cm) : 0 N/A N/A Volume (cm) : 0 N/A N/A % Reduction  in Area: 100.00% N/A N/A % Reduction in Volume: 100.00% N/A N/A Classification: Full Thickness Without Exposed N/A N/A Support Structures Exudate Amount: None Present N/A N/A Wound Margin: Distinct, outline attached N/A N/A Granulation Amount: None Present (0%) N/A N/A Necrotic Amount: None Present (0%) N/A N/A Exposed Structures: Fascia: No N/A N/A Fat Layer (Subcutaneous Tissue): No Tendon: No Muscle: No Joint: No Bone: No Epithelialization: Large (67-100%) N/A N/A Treatment Notes Electronic Signature(s) Signed: 02/26/2022 3:11:41 PM By: Massie Kluver Entered By: Massie Kluver on 02/26/2022 09:25:45 Joyce Evans ([id number]) -------------------------------------------------------------------------------- [place] Details Patient Name: Joyce Evans Date of Service: 02/26/2022 9:00 AM Medical Record Number: [id number] Patient Account Number: [id number] Date of Birth/Sex: 12-Jul-1941 (80 y.o. F) Treating RN: Cornell Barman Primary Care Jori Frerichs: Derinda Late Other Clinician: Massie Kluver Referring Aneira Cavitt: Derinda Late Treating Keary Hanak/Extender: Skipper Cliche in Treatment: 5 Active Inactive Electronic Signature(s) Signed: 02/26/2022 3:11:41 PM By: Massie Kluver Signed: 02/26/2022 5:20:01 PM By: Gretta Cool BSN, RN, CWS, Kim RN, BSN Entered By: Massie Kluver on 02/26/2022  09:25:15 Joyce Evans ([id number]) -------------------------------------------------------------------------------- Pain Assessment Details Patient Name: Joyce Evans Date of Service: 02/26/2022 9:00 AM Medical Record Number: [id number] Patient Account Number: [id number] Date of Birth/Sex: 09-11-1940 (80 y.o. F) Treating RN: Cornell Barman Primary Care Mata Rowen: Derinda Late Other Clinician: Massie Kluver Referring Donie Moulton: Derinda Late Treating Channon Ambrosini/Extender: Skipper Cliche in Treatment: 5 Active Problems Location of Pain Severity and Description of Pain Patient Has Paino No Site Locations Pain Management and Medication Current Pain Management: Electronic Signature(s) Signed: 02/26/2022 3:11:41 PM By: Massie Kluver Signed: 02/26/2022 5:20:01 PM By: Gretta Cool, BSN, RN, CWS, Kim RN, BSN Entered By: Massie Kluver on 02/26/2022 09:05:39 Joyce Evans ([id number]) -------------------------------------------------------------------------------- Patient/Caregiver Education Details Patient Name: Joyce Evans Date of Service: 02/26/2022 9:00 AM Medical Record Number: [id number] Patient Account Number: [id number] Date of Birth/Gender: 1941/05/15 (80 y.o. F) Treating RN: Cornell Barman Primary Care Physician: Derinda Late Other Clinician: Massie Kluver Referring Physician: Derinda Late Treating Physician/Extender: Skipper Cliche in Treatment: 5 Education Assessment Education Provided To: Patient Education Topics Provided Wound/Skin Impairment: Handouts: Other: wound healed. Please call if any further issues arise Methods: Explain/Verbal Responses: State content correctly Electronic Signature(s) Signed: 02/26/2022 3:11:41 PM By: Massie Kluver Entered By: Massie Kluver on 02/26/2022 09:28:52 Strojny, Candie Echevaria ([id number]) -------------------------------------------------------------------------------- Wound Assessment Details Patient Name: Joyce Evans Date of Service: 02/26/2022 9:00 AM Medical Record Number: [id number] Patient Account Number: [id number] Date of Birth/Sex: September 18, 1940 (80 y.o. F) Treating RN: Cornell Barman Primary Care Auna Mikkelsen: Derinda Late Other Clinician: Massie Kluver Referring Paloma Grange: Derinda Late Treating Annaliza Zia/Extender: Skipper Cliche in Treatment: 5 Wound Status Wound Number: 1 Primary Trauma, Other Etiology: Wound Location: Left, Midline Lower Leg Wound Status: Healed - Epithelialized Wounding Event: Trauma Comorbid Glaucoma, Arrhythmia, Hypertension, End Stage Renal Date Acquired: 12/11/2021 History: Disease Weeks Of Treatment: 5 Clustered Wound: No Photos Wound Measurements Length: (cm) 0 Width: (cm) 0 Depth: (cm) 0 Area: (cm) 0 Volume: (cm) 0 % Reduction in Area: 100% % Reduction in Volume: 100% Epithelialization: Large (67-100%) Tunneling: No Wound Description Classification: Full Thickness Without Exposed Support Structure Wound Margin: Distinct, outline attached Exudate Amount: None Present s Foul Odor After Cleansing: No Slough/Fibrino No Wound Bed Granulation Amount: None Present (0%) Exposed Structure Necrotic Amount: None Present (0%) Fascia Exposed: No Fat Layer (Subcutaneous Tissue) Exposed: No Tendon Exposed: No Muscle Exposed: No Joint Exposed: No Bone Exposed: No Treatment Notes Wound #1 (Lower Leg) Wound Laterality: Left, Midline  Cleanser Peri-Wound Care Topical Primary Dressing ZOEE, HEENEY ([id number]) Secondary Dressing Secured With Compression Wrap Compression Stockings Add-Ons Electronic Signature(s) Signed: 02/26/2022 3:11:41 PM By: Massie Kluver Signed: 02/26/2022 5:20:01 PM By: Gretta Cool, BSN, RN, CWS, Kim RN, BSN Entered By: Massie Kluver on 02/26/2022 09:24:48 Joyce Evans ([id number]) -------------------------------------------------------------------------------- Vitals Details Patient Name: Joyce Evans Date of Service:  02/26/2022 9:00 AM Medical Record Number: [id number] Patient Account Number: [id number] Date of Birth/Sex: Sep 25, 1940 (80 y.o. F) Treating RN: Cornell Barman Primary Care Maycee Blasco: Derinda Late Other Clinician: Massie Kluver Referring Leanny Moeckel: Derinda Late Treating Sherryann Frese/Extender: Skipper Cliche in Treatment: 5 Vital Signs Time Taken: 09:03 Temperature (F): 97.9 Height (in): 60 Pulse (bpm): 75 Respiratory Rate (breaths/min): 18 Blood Pressure (mmHg): 137/75 Reference Range: 80 - 120 mg / dl Electronic Signature(s) Signed: 02/26/2022 3:11:41 PM By: Massie Kluver Entered By: Massie Kluver on 02/26/2022 09:05:36

## 2022-03-02 ENCOUNTER — Ambulatory Visit: Payer: Medicare PPO

## 2022-03-02 DIAGNOSIS — M6281 Muscle weakness (generalized): Secondary | ICD-10-CM

## 2022-03-02 DIAGNOSIS — Z9181 History of falling: Secondary | ICD-10-CM

## 2022-03-02 DIAGNOSIS — R262 Difficulty in walking, not elsewhere classified: Secondary | ICD-10-CM

## 2022-03-02 DIAGNOSIS — M5417 Radiculopathy, lumbosacral region: Secondary | ICD-10-CM

## 2022-03-02 DIAGNOSIS — M5459 Other low back pain: Secondary | ICD-10-CM | POA: Diagnosis not present

## 2022-03-02 DIAGNOSIS — M5431 Sciatica, right side: Secondary | ICD-10-CM

## 2022-03-02 NOTE — Therapy (Signed)
OUTPATIENT PHYSICAL THERAPY TREATMENT NOTE    Patient Name: Joyce Evans MRN: [id number] DOB:21-Nov-1940, 81 y.o., female Today's Date: 03/02/2022  PCP: Derinda Late, MD  REFERRING PROVIDER: Phylliss Bob, MD   PT End of Session - 03/02/22 1052     Visit Number 12    Number of Visits 17    Date for PT Re-Evaluation 03/12/22    Authorization Type [employer]    Authorization Time Period cert through March 12, 2022    Progress Note Due on Visit 10    PT Start Time 1052    PT Stop Time 1116    PT Time Calculation (min) 24 min    Activity Tolerance Patient tolerated treatment well;No increased pain    Behavior During Therapy [facility] for tasks assessed/performed                   Past Medical History:  Diagnosis Date   Arthritis    osteo - shoulders, fingers   Atrial fibrillation ([place])    Dizziness    thinks because of diuretic   Family history of adverse reaction to anesthesia    daughter -PONV   Gallstones 2016, 2017   GERD (gastroesophageal reflux disease)    Glaucoma    Heart murmur    history of   Hypertension    Melanoma ([place]) 04/20/2016   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 Excised: 05/19/2016, margins free.   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   Tricuspid valve disorder    leak   Past Surgical History:  Procedure Laterality Date   ABDOMINAL HYSTERECTOMY  1987   partial   CARDIAC CATHETERIZATION  2010   [facility]   CATARACT EXTRACTION W/PHACO Right 08/28/2015   Procedure: CATARACT EXTRACTION PHACO AND INTRAOCULAR LENS PLACEMENT ([place]);  Surgeon: Leandrew Koyanagi, MD;  Location: [place];  Service: Ophthalmology;  Laterality: Right;  TORIC   INTRAMEDULLARY (IM) NAIL INTERTROCHANTERIC Right 10/16/2021   Procedure: INTRAMEDULLARY (IM) NAIL INTERTROCHANTRIC;  Surgeon: Renee Harder, MD;  Location: [facility];  Service: Orthopedics;  Laterality: Right;   MELANOMA EXCISION Left 05/19/2016   MIS. Left mid. lat. tricep. 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 S/P IM nail placement for right intertrochanteric hip fracture with Dr. Sharlet Salina on 10/16/2021. Pt slipped on the kithen floor. Pt was getting something from the microwave and pt tripped on her O2 tube. Pt was independent with ambulation prior to her fall. Pt fell before in the woods and pt tripped over a root 3 years ago (no injuries per pt). Pt has been using supplemental O2 for the past 2 years in November. Pt has interstitial lung disease (idiopathic pulmonary fibrosis). Recent breathing test is stable. Back pain began when pt was in  Rehab in [place] [facility]. Feels like it is sciatical. Pt radiates along the sciatic nerve and R L 5 dermatome. Pt is healing fine as far as her R hip surgery is concerned. Also has R medial knee pain due to arthritis. Had home health PT which involves seated knee extension, hip flexion, standing hip abduction, extension. Pt did some walking wiht a rw. Feels a lot of pain all over. Walking too far is limited due to her O2 situation.  PRECAUTIONS: SpO2 not under 90 %  SUBJECTIVE: Last blood work, her creatinine was way up, hemoglobin was down a little bit as well. Feels like a lot of it is the medicine she is taking. Does not feel like she needs gabapentin and the muscle relaxer  anymore. Not taking anything for pain now. Has soreness in the morning but its gone once she moves around. The swelling in her legs seem to be down. Feels dizzy currently when she got to the clinic. Ate breakfast, also drank water. Has Afib, so did her mother and so does her sister.      PAIN:  Are you having pain? 0/10 back pain currently. 0/10 R knee soreness     TODAY'S TREATMENT:   Therapeutic exercise  97% SpO2, 4 L, HR 46-54 at rest at start of session  3 L O2, 100%, HR 45-60 bpm.  BP 130/62, HR 51   Seated B scapular retraction 3x5 seconds HR 43 to 64  Session stopped for now secondary to irregular heart rate as well as abnormal lab values.   Pt was recommended to call her cardiologist pertaining to her irregular heart rate.  Pt was recommended to go to ER if symptoms worsen. Pt verbalized understanding. Pt does not appear in distress.   Response to treatment Pt tolerated session well without aggravation of symptoms.      Clinical impression  Session ended early secondary to irregular heart rate, ranging from 43 to 64 bpm after 3 scapular retraction exercises as well as abnormal recent lab values and pt reports of feeling dizzy. HR and O2 monitored during session. Pt was recommended to call her cardiologist pertaining to her irregular heart rate.  Pt was recommended to go to ER if symptoms worsen. Pt verbalized understanding. Pt does not appear in distress. Pt tolerated session well without aggravation of symptoms. Pt will benefit from continued skilled physical therapy services to decrease pain, improve strength and function.      PATIENT EDUCATION: Education details: ther-ex, HEP Person educated: Patient Education method: Explanation, Demonstration, Tactile cues, Verbal cues, and Handouts Education comprehension: verbalized understanding and returned demonstration   HOME EXERCISE PROGRAM: Access Code: [email or web address] URL: [email or web address] Date:  01/20/2022 Prepared by: Joneen Boers  Exercises - Seated Transversus Abdominis Bracing  - 1 x daily - 7 x weekly - 3 sets - 10 reps - 5 seconds hold - Seated Gluteal Sets  - 1 x daily - 7 x weekly - 3 sets - 10 reps - 5 seconds hold - Seated Scapular Retraction  - 1 x daily - 7 x weekly - 3 sets - 10 reps - 5 seconds  hold  Can perform aforementioned exercises in standing now.    - Seated Hip Abduction with Resistance  - 1 x daily - 7 x weekly - 2 sets - 10 reps - Seated Hip Adduction Isometrics with Ball  - 1 x daily - 7 x weekly - 2 sets - 10 reps - 5 seconds hold - Standing Hip Abduction  with Counter Support  - 1 x daily - 7 x weekly - 3 sets - 10 reps    PT Short Term Goals - 02/16/22 0941       PT SHORT TERM GOAL #1   Title Pt will be independent with her initial HEP to decrease pain, improve strength, function, and ability to stand and ambulate more comfortably.    Baseline No questions with HEP, has been doing them (02/16/2022)    Time 3    Period Weeks    Status Achieved    Target Date 02/05/22              PT Long Term Goals - 02/16/22 0942       PT LONG TERM GOAL #1   Title Pt will have a decrease in low back pain to 3/10 or less at worst to promote ability to stand and ambulate with less difficulty.    Baseline 6/10 low back pain at worst for the past 2 months (01/12/2022); 7/10 at most for the past 7 days (standing in the kitchen, more standing than normal becuase pt had guests over from [facility]) ( 02/16/2022)    Time 8    Period Weeks    Status On-going    Target Date 03/12/22      PT LONG TERM GOAL #2   Title Pt will have a decrease in R LE pain to 3/10 or less at worst to promote ability to stand and ambulate will less difficulty.    Baseline 9/10 R LE pain at worst (01/12/2022); 3/10 R sciatic and L5 dermatome pain at most for the past 7 days; has R > L heel pain however which wakes her up at night. (02/16/2022)    Time 8    Period Weeks    Status Partially  Met    Target Date 03/12/22      PT LONG TERM GOAL #3   Title Pt will improve her FOTO score by at least 10 points as a demontsration of improved function.    Baseline Lumbar Spine FOTO 39 (01/12/2022); 46 (02/16/2022)    Time 8    Period Weeks    Status Partially Met    Target Date 03/12/22      PT LONG TERM GOAL #4   Title Pt will be able to ambulate at least 100 ft independently to promote mobility and return to prior level of function.    Baseline Able to ambulate with rw short distance, about 40 ft with reproduction of pain. (01/12/2022); able to ambulate 120 ft with rw, SBA (02/16/2022)    Time 8    Period Weeks    Status On-going    Target Date 03/12/22              Plan - 03/02/22 1050     Clinical Impression Statement Session ended early secondary to irregular heart rate, ranging from 43 to 64 bpm after 3 scapular retraction exercises as well as abnormal recent lab values and pt reports of feeling dizzy. HR and O2 monitored during session. Pt was recommended to call her cardiologist pertaining to her irregular heart rate.  Pt was recommended to go to ER if symptoms worsen. Pt verbalized understanding. Pt does not appear in distress. Pt tolerated session well without aggravation of symptoms. Pt will benefit from continued skilled physical therapy services to decrease pain, improve strength and function.    Personal Factors and Comorbidities Comorbidity 3+;Age;Time since onset of injury/illness/exacerbation;Fitness  Comorbidities Arthritis, atrial fibrillation, HTN, idiopathic pulmonary fibrosis    Examination-Activity Limitations Stairs;Bed Mobility;Stand;Sit;Lift;Transfers;Locomotion Level;Carry    Stability/Clinical Decision Making Stable/Uncomplicated    Rehab Potential Fair    PT Frequency 2x / week    PT Duration 8 weeks    PT Treatment/Interventions Therapeutic activities;Therapeutic exercise;Gait training;Balance training;Neuromuscular re-education;Patient/family  education;Manual techniques;Dry needling;Electrical Stimulation;Iontophoresis 28m/ml Dexamethasone    PT Next Visit Plan Posture, thoracic extension, trunk and glute strength, lumbopelvic control during gait, manual techniques, modalities PRN    PT Home Exercise Plan Medbridge Access Code: [email or web address]   Consulted and Agree with Plan of Care Patient                  MJoneen BoersPT, DPT  03/02/2022, 6:29 PM

## 2022-03-05 ENCOUNTER — Ambulatory Visit: Payer: Medicare PPO

## 2022-03-05 DIAGNOSIS — Z9181 History of falling: Secondary | ICD-10-CM

## 2022-03-05 DIAGNOSIS — R262 Difficulty in walking, not elsewhere classified: Secondary | ICD-10-CM

## 2022-03-05 DIAGNOSIS — M5417 Radiculopathy, lumbosacral region: Secondary | ICD-10-CM

## 2022-03-05 DIAGNOSIS — M5459 Other low back pain: Secondary | ICD-10-CM

## 2022-03-05 DIAGNOSIS — M5431 Sciatica, right side: Secondary | ICD-10-CM

## 2022-03-05 DIAGNOSIS — M6281 Muscle weakness (generalized): Secondary | ICD-10-CM

## 2022-03-05 NOTE — Therapy (Signed)
OUTPATIENT PHYSICAL THERAPY TREATMENT NOTE    Patient Name: Joyce Evans MRN: [id number] DOB:01-21-1941, 81 y.o., female Today's Date: 03/05/2022  PCP: Derinda Late, MD  REFERRING PROVIDER: Phylliss Bob, MD   PT End of Session - 03/05/22 1107     Visit Number 13    Number of Visits 17    Date for PT Re-Evaluation 03/12/22    Authorization Type [employer]    Authorization Time Period cert through March 12, 2022    Progress Note Due on Visit 10    PT Start Time 1107    PT Stop Time 1148    PT Time Calculation (min) 41 min    Activity Tolerance Patient tolerated treatment well;No increased pain    Behavior During Therapy [facility] for tasks assessed/performed                    Past Medical History:  Diagnosis Date   Arthritis    osteo - shoulders, fingers   Atrial fibrillation ([place])    Dizziness    thinks because of diuretic   Family history of adverse reaction to anesthesia    daughter -PONV   Gallstones 2016, 2017   GERD (gastroesophageal reflux disease)    Glaucoma    Heart murmur    history of   Hypertension    Melanoma ([place]) 04/20/2016   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 Excised: 05/19/2016, margins free.   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   Tricuspid valve disorder    leak   Past Surgical History:  Procedure Laterality Date   ABDOMINAL HYSTERECTOMY  1987   partial   CARDIAC CATHETERIZATION  2010   [facility]   CATARACT EXTRACTION W/PHACO Right 08/28/2015   Procedure: CATARACT EXTRACTION PHACO AND INTRAOCULAR LENS PLACEMENT ([place]);  Surgeon: Leandrew Koyanagi, MD;  Location: [place];  Service: Ophthalmology;  Laterality: Right;  TORIC   INTRAMEDULLARY (IM) NAIL INTERTROCHANTERIC Right 10/16/2021   Procedure: INTRAMEDULLARY (IM) NAIL INTERTROCHANTRIC;  Surgeon: Renee Harder, MD;  Location: [facility];  Service: Orthopedics;  Laterality: Right;   MELANOMA EXCISION Left 05/19/2016   MIS. Left mid. lat. tricep. 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 S/P IM nail placement for right intertrochanteric hip fracture with Dr. Sharlet Salina on 10/16/2021. Pt slipped on the kithen floor. Pt was getting something from the microwave and pt tripped on her O2 tube. Pt was independent with ambulation prior to her fall. Pt fell before in the woods and pt tripped over a root 3 years ago (no injuries per pt). Pt has been using supplemental O2 for the past 2 years in November. Pt has interstitial lung disease (idiopathic pulmonary fibrosis). Recent breathing test is stable. Back pain began when pt was  in Rehab in [place] [facility]. Feels like it is sciatical. Pt radiates along the sciatic nerve and R L 5 dermatome. Pt is healing fine as far as her R hip surgery is concerned. Also has R medial knee pain due to arthritis. Had home health PT which involves seated knee extension, hip flexion, standing hip abduction, extension. Pt did some walking wiht a rw. Feels a lot of pain all over. Walking too far is limited due to her O2 situation.  PRECAUTIONS: SpO2 not under 90 %  SUBJECTIVE: Heart doctor decreased her carvedilol. Was told that it is ok to go to PT. Stop if she is feeling bad.  Did a thyroid test which was fine. Off the pain medication.     PAIN:  Are you having pain?  0/10 back pain currently. 0/10 R knee soreness     TODAY'S TREATMENT:   Therapeutic exercise  98% SpO2, 3 L, HR 53-58 at rest at start of session   Standing with B UE assist                Hip abduction                          R 10x2                         L 10x2                                96% SpO2 (on 3 L O2), HR 63                    Then Hip abduction                          R 10x                         L 10x                   Hip extension                          R 10x                         L 10x                           95% at 3 L O2  HR 71   Hip extension                          R 10x                         L 10x    L hip hikes to improve R glute med closed chain strength 10x  R knee discomfort                          95% at 3 L O2  HR 64    Standing B shoulder low rows yellow band 10x3  89% at 3 L O2  HR 63   Improved to 97%, HR 66 after about 30 seconds rest.   Gait with SPC on L side 20 ft. CGA. 3 point pattern, cues to increase L LE step length  86% SpO2, HR 76 3 L  Improved to 97 %, HR 69 after about 1 min rest   Gait with SPC on L side 20 ft. CGA. 3 point pattern, cues to increase L LE step length  87% SpO2, HR 75 3 L   Improved to 95 %, HR 75 after about 1 min rest    SpO2 decreases when pt talks     SpO2 95 %, HR 56 at end of session.       Response to treatment Pt tolerated session well without aggravation of symptoms.      Clinical impression   Improved oxygenation observed with pt able to perform all exercises, including standing exercises with using 3 L instead of 4L today. HR within normal range. Continued working on improving B glute strength especially R glute med to decrease pelvic drop and low back stress during gait. Able to ambulate 2x20 ft with SPC on L side again today, steady, no LOB. Decreased SpO2 after gait with SPC but improves after rest. Pt tolerated session  well without aggravation of symptoms. Pt will benefit from continued skilled physical therapy services to decrease pain, improve strength and function.      PATIENT EDUCATION: Education details: ther-ex, HEP Person educated: Patient Education method: Explanation, Demonstration, Tactile cues, Verbal cues, and Handouts Education comprehension: verbalized understanding and returned demonstration   HOME EXERCISE PROGRAM: Access Code: [email or web address] URL: [email or web address] Date: 01/20/2022 Prepared by: Joneen Boers  Exercises - Seated Transversus Abdominis Bracing  - 1 x daily - 7 x weekly - 3 sets - 10 reps - 5 seconds hold - Seated Gluteal Sets  - 1 x daily - 7 x weekly - 3 sets - 10 reps - 5 seconds hold - Seated Scapular Retraction  - 1 x daily - 7 x weekly - 3 sets - 10 reps - 5 seconds  hold  Can perform aforementioned exercises in standing now.    - Seated Hip Abduction with Resistance  - 1 x daily - 7 x weekly - 2 sets - 10 reps - Seated Hip Adduction Isometrics with Ball  - 1 x daily - 7 x weekly - 2 sets - 10 reps - 5 seconds hold - Standing Hip Abduction with Counter Support  - 1 x daily - 7 x weekly - 3 sets - 10 reps    PT Short Term Goals - 02/16/22 0941       PT SHORT TERM GOAL #1   Title Pt will be independent with her initial HEP to decrease pain, improve strength, function, and ability to stand and ambulate more comfortably.    Baseline No questions with HEP, has been doing them (02/16/2022)    Time 3    Period Weeks    Status Achieved    Target Date 02/05/22              PT Long Term Goals - 02/16/22 0942       PT LONG TERM GOAL #1   Title Pt will have a decrease in low back pain to 3/10 or less at worst to promote ability to stand and ambulate with less difficulty.    Baseline 6/10 low back pain at worst for the past 2 months (01/12/2022); 7/10  at most for the past 7 days (standing in the kitchen, more standing than normal becuase pt had  guests over from [facility]) ( 02/16/2022)    Time 8    Period Weeks    Status On-going    Target Date 03/12/22      PT LONG TERM GOAL #2   Title Pt will have a decrease in R LE pain to 3/10 or less at worst to promote ability to stand and ambulate will less difficulty.    Baseline 9/10 R LE pain at worst (01/12/2022); 3/10 R sciatic and L5 dermatome pain at most for the past 7 days; has R > L heel pain however which wakes her up at night. (02/16/2022)    Time 8    Period Weeks    Status Partially Met    Target Date 03/12/22      PT LONG TERM GOAL #3   Title Pt will improve her FOTO score by at least 10 points as a demontsration of improved function.    Baseline Lumbar Spine FOTO 39 (01/12/2022); 46 (02/16/2022)    Time 8    Period Weeks    Status Partially Met    Target Date 03/12/22      PT LONG TERM GOAL #4   Title Pt will be able to ambulate at least 100 ft independently to promote mobility and return to prior level of function.    Baseline Able to ambulate with rw short distance, about 40 ft with reproduction of pain. (01/12/2022); able to ambulate 120 ft with rw, SBA (02/16/2022)    Time 8    Period Weeks    Status On-going    Target Date 03/12/22              Plan - 03/05/22 1204     Clinical Impression Statement Improved oxygenation observed with pt able to perform all exercises, including standing exercises with using 3 L instead of 4L today. HR within normal range. Continued working on improving B glute strength especially R glute med to decrease pelvic drop and low back stress during gait. Able to ambulate 2x20 ft with SPC on L side again today, steady, no LOB. Decreased SpO2 after gait with SPC but improves after rest. Pt tolerated session well without aggravation of symptoms. Pt will benefit from continued skilled physical therapy services to decrease pain, improve strength and function.    Personal Factors and Comorbidities Comorbidity 3+;Age;Time since onset of  injury/illness/exacerbation;Fitness    Comorbidities Arthritis, atrial fibrillation, HTN, idiopathic pulmonary fibrosis    Examination-Activity Limitations Stairs;Bed Mobility;Stand;Sit;Lift;Transfers;Locomotion Level;Carry    Stability/Clinical Decision Making Stable/Uncomplicated    Rehab Potential Fair    PT Frequency 2x / week    PT Duration 8 weeks    PT Treatment/Interventions Therapeutic activities;Therapeutic exercise;Gait training;Balance training;Neuromuscular re-education;Patient/family education;Manual techniques;Dry needling;Electrical Stimulation;Iontophoresis 73m/ml Dexamethasone    PT Next Visit Plan Posture, thoracic extension, trunk and glute strength, lumbopelvic control during gait, manual techniques, modalities PRN    PT Home Exercise Plan Medbridge Access Code: [email or web address]   Consulted and Agree with Plan of Care Patient                   MJoneen BoersPT, DPT  03/05/2022, 12:05 PM

## 2022-03-09 ENCOUNTER — Ambulatory Visit: Payer: Medicare PPO

## 2022-03-09 DIAGNOSIS — Z9181 History of falling: Secondary | ICD-10-CM

## 2022-03-09 DIAGNOSIS — M5459 Other low back pain: Secondary | ICD-10-CM

## 2022-03-09 DIAGNOSIS — M6281 Muscle weakness (generalized): Secondary | ICD-10-CM

## 2022-03-09 DIAGNOSIS — M5417 Radiculopathy, lumbosacral region: Secondary | ICD-10-CM

## 2022-03-09 DIAGNOSIS — M5431 Sciatica, right side: Secondary | ICD-10-CM

## 2022-03-09 DIAGNOSIS — R262 Difficulty in walking, not elsewhere classified: Secondary | ICD-10-CM

## 2022-03-09 NOTE — Therapy (Signed)
OUTPATIENT PHYSICAL THERAPY TREATMENT NOTE    Patient Name: Joyce Evans MRN: [id number] DOB:1940/10/08, 81 y.o., female Today's Date: 03/09/2022  PCP: Derinda Late, MD  REFERRING PROVIDER: Leonie Green, NP   PT End of Session - 03/09/22 1104     Visit Number 14    Number of Visits 17    Date for PT Re-Evaluation 03/12/22    Authorization Type [employer]    Authorization Time Period cert through March 12, 2022    Progress Note Due on Visit 10    PT Start Time 1104    PT Stop Time 1144    PT Time Calculation (min) 40 min    Activity Tolerance Patient tolerated treatment well;No increased pain    Behavior During Therapy [facility] for tasks assessed/performed                     Past Medical History:  Diagnosis Date   Arthritis    osteo - shoulders, fingers   Atrial fibrillation ([place])    Dizziness    thinks because of diuretic   Family history of adverse reaction to anesthesia    daughter -PONV   Gallstones 2016, 2017   GERD (gastroesophageal reflux disease)    Glaucoma    Heart murmur    history of   Hypertension    Melanoma ([place]) 04/20/2016   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 Excised: 05/19/2016, margins free.   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   Tricuspid valve disorder    leak   Past Surgical History:  Procedure Laterality Date   ABDOMINAL HYSTERECTOMY  1987   partial   CARDIAC CATHETERIZATION  2010   [facility]   CATARACT EXTRACTION W/PHACO Right 08/28/2015   Procedure: CATARACT EXTRACTION PHACO AND INTRAOCULAR LENS PLACEMENT ([place]);  Surgeon: Leandrew Koyanagi, MD;  Location: [place];  Service: Ophthalmology;  Laterality: Right;  TORIC   INTRAMEDULLARY (IM) NAIL INTERTROCHANTERIC Right 10/16/2021   Procedure: INTRAMEDULLARY (IM) NAIL INTERTROCHANTRIC;  Surgeon: Renee Harder, MD;  Location: [facility];  Service: Orthopedics;  Laterality: Right;   MELANOMA EXCISION Left 05/19/2016   MIS. Left mid. lat. tricep. 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 S/P IM nail placement for right intertrochanteric hip fracture with Dr. Sharlet Salina on 10/16/2021. Pt slipped on the kithen floor. Pt was getting something from the microwave and pt tripped on her O2 tube. Pt was independent with ambulation prior to her fall. Pt fell before in the woods and pt tripped over a root 3 years ago (no injuries per pt). Pt has been using supplemental O2 for the past 2 years in November. Pt has interstitial lung disease (idiopathic pulmonary fibrosis). Recent breathing test is stable. Back pain began when pt  was in Rehab in [facility]. Feels like it is sciatical. Pt radiates along the sciatic nerve and R L 5 dermatome. Pt is healing fine as far as her R hip surgery is concerned. Also has R medial knee pain due to arthritis. Had home health PT which involves seated knee extension, hip flexion, standing hip abduction, extension. Pt did some walking wiht a rw. Feels a lot of pain all over. Walking too far is limited due to her O2 situation.  PRECAUTIONS: SpO2 not under 90 %  SUBJECTIVE: Back bothers her after standing for about 30 minutes.  Pt states that the way she signed the Advanced directive is that both daughters and husband are the POA's. Can receive CPR if needed.  6-7/10 back  pain at worst for the past 7 days. Back pain at worst goes away faster since starting PT.     PAIN:  Are you having pain? 0/10 back pain currently. 0/10 R knee soreness     TODAY'S TREATMENT:   Therapeutic exercise  97% SpO2, 3 L, HR 55-61 at rest at start of session   Standing with B UE assist                Hip abduction                          R 10x2                         L 10x2                                95% SpO2 (on 3 L O2), HR 78-81                    Then Hip abduction                          R 10x                         L 10x                   Hip extension                          R 10x2                         L 10x2                           93% at 3 L O2  HR 73-78   Increased to 97% after more rest    Hip extension                          R 10x                         L 10x    R knee discomfort   L hip hikes to improve R glute med closed chain strength 10x  R knee discomfort  96% at 3 L O2  HR 80-83   Gait with SPC on L side 40 ft. CGA. 3 point pattern, cues to increase L LE step length   89% SpO2, HR 78 3 L  Improved to 97 %, HR 72 after about 1 min rest    Gait with SPC on L side 40 ft. CGA. 3 point pattern, cues to increase L LE step length  87% SpO2, HR 75 3 L   Improved to 96 %, HR 71 after about 1 min rest      Standing B shoulder low rows yellow band 10x3 to promote trunk strengthening                                         93% at 3 L O2  HR 71    Improved to 97%, HR 71 after about 30 seconds rest.      Standing with B UE assist                Hip abduction                          R 10x5 seconds for 2 sets (5 second holds added)   Easy for R side.                         91% 3 L SpO2, HR 72     Response to treatment Pt tolerated session well without aggravation of symptoms.      Clinical impression  Decreased duration of back pain at worst based on subjective reports. Able to  perform all exercises on 3 L O2 today. Continued working on improving B glute strength especially R glute med to decrease pelvic drop and low back stress during gait. Able to ambulate 2x40 ft with SPC on L side today, steady, no LOB. Longer distance ambulated with [facility] today compared to previous sessions. Pt tolerated session well without aggravation of symptoms. Pt will benefit from continued skilled physical therapy services to decrease pain, improve strength and function.      PATIENT EDUCATION: Education details: ther-ex, HEP Person educated: Patient Education method: Explanation, Demonstration, Tactile cues, Verbal cues, and Handouts Education comprehension: verbalized understanding and returned demonstration   HOME EXERCISE PROGRAM: Access Code: [email or web address] URL: [email or web address] Date: 01/20/2022 Prepared by: Joneen Boers  Exercises - Seated Transversus Abdominis Bracing  - 1 x daily - 7 x weekly - 3 sets - 10 reps - 5 seconds hold - Seated Gluteal Sets  - 1 x daily - 7 x weekly - 3 sets - 10 reps - 5 seconds hold - Seated Scapular Retraction  - 1 x daily - 7 x weekly - 3 sets - 10 reps - 5 seconds  hold  Can perform aforementioned exercises in standing now.    - Seated Hip Adduction Isometrics with Ball  - 1 x daily - 7 x weekly - 2 sets - 10 reps - 5 seconds hold - Standing Hip Abduction with Counter Support  - 1 x daily - 7 x weekly - 3 sets - 10 reps - Discontinued on 03/09/2022: Seated Hip Abduction with Resistance  - 1 x daily - 7 x weekly - 2 sets - 10 reps   PT Short Term Goals - 02/16/22 0941       PT  SHORT TERM GOAL #1   Title Pt will be independent with her initial HEP to decrease pain, improve strength, function, and ability to stand and ambulate more comfortably.    Baseline No questions with HEP, has been doing them (02/16/2022)    Time 3    Period Weeks    Status Achieved    Target Date 02/05/22              PT Long Term Goals - 02/16/22  0942       PT LONG TERM GOAL #1   Title Pt will have a decrease in low back pain to 3/10 or less at worst to promote ability to stand and ambulate with less difficulty.    Baseline 6/10 low back pain at worst for the past 2 months (01/12/2022); 7/10 at most for the past 7 days (standing in the kitchen, more standing than normal becuase pt had guests over from [facility]) ( 02/16/2022)    Time 8    Period Weeks    Status On-going    Target Date 03/12/22      PT LONG TERM GOAL #2   Title Pt will have a decrease in R LE pain to 3/10 or less at worst to promote ability to stand and ambulate will less difficulty.    Baseline 9/10 R LE pain at worst (01/12/2022); 3/10 R sciatic and L5 dermatome pain at most for the past 7 days; has R > L heel pain however which wakes her up at night. (02/16/2022)    Time 8    Period Weeks    Status Partially Met    Target Date 03/12/22      PT LONG TERM GOAL #3   Title Pt will improve her FOTO score by at least 10 points as a demontsration of improved function.    Baseline Lumbar Spine FOTO 39 (01/12/2022); 46 (02/16/2022)    Time 8    Period Weeks    Status Partially Met    Target Date 03/12/22      PT LONG TERM GOAL #4   Title Pt will be able to ambulate at least 100 ft independently to promote mobility and return to prior level of function.    Baseline Able to ambulate with rw short distance, about 40 ft with reproduction of pain. (01/12/2022); able to ambulate 120 ft with rw, SBA (02/16/2022)    Time 8    Period Weeks    Status On-going    Target Date 03/12/22              Plan - 03/09/22 1104     Clinical Impression Statement Decreased duration of back pain at worst based on subjective reports. Able to perform all exercises on 3 L O2 today. Continued working on improving B glute strength especially R glute med to decrease pelvic drop and low back stress during gait. Able to ambulate 2x40 ft with SPC on L side today, steady, no LOB. Longer distance  ambulated with [facility] today compared to previous sessions. Pt tolerated session well without aggravation of symptoms. Pt will benefit from continued skilled physical therapy services to decrease pain, improve strength and function.    Personal Factors and Comorbidities Comorbidity 3+;Age;Time since onset of injury/illness/exacerbation;Fitness    Comorbidities Arthritis, atrial fibrillation, HTN, idiopathic pulmonary fibrosis    Examination-Activity Limitations Stairs;Bed Mobility;Stand;Sit;Lift;Transfers;Locomotion Level;Carry    Stability/Clinical Decision Making Stable/Uncomplicated    Clinical Decision Making Low    Rehab Potential Fair  PT Frequency 2x / week    PT Duration 8 weeks    PT Treatment/Interventions Therapeutic activities;Therapeutic exercise;Gait training;Balance training;Neuromuscular re-education;Patient/family education;Manual techniques;Dry needling;Electrical Stimulation;Iontophoresis 58m/ml Dexamethasone    PT Next Visit Plan Posture, thoracic extension, trunk and glute strength, lumbopelvic control during gait, manual techniques, modalities PRN    PT Home Exercise Plan Medbridge Access Code: [email or web address]   Consulted and Agree with Plan of Care Patient                    MJoneen BoersPT, DPT  03/09/2022, 12:00 PM

## 2022-03-12 ENCOUNTER — Ambulatory Visit: Payer: Medicare PPO

## 2022-03-12 DIAGNOSIS — Z9181 History of falling: Secondary | ICD-10-CM

## 2022-03-12 DIAGNOSIS — M5459 Other low back pain: Secondary | ICD-10-CM

## 2022-03-12 DIAGNOSIS — M6281 Muscle weakness (generalized): Secondary | ICD-10-CM

## 2022-03-12 DIAGNOSIS — M5417 Radiculopathy, lumbosacral region: Secondary | ICD-10-CM

## 2022-03-12 DIAGNOSIS — R262 Difficulty in walking, not elsewhere classified: Secondary | ICD-10-CM

## 2022-03-12 DIAGNOSIS — M5431 Sciatica, right side: Secondary | ICD-10-CM

## 2022-03-12 NOTE — Therapy (Signed)
OUTPATIENT PHYSICAL THERAPY TREATMENT NOTE    Patient Name: Joyce Evans MRN: [id number] DOB:January 08, 1941, 81 y.o., female Today's Date: 03/12/2022  PCP: Derinda Late, MD  REFERRING PROVIDER: Leonie Green, NP   PT End of Session - 03/12/22 0802     Visit Number 15    Number of Visits 33    Date for PT Re-Evaluation 05/07/22    Authorization Type [employer]    Authorization Time Period cert through March 12, 2022    Progress Note Due on Visit 10    PT Start Time 0803    PT Stop Time 0845    PT Time Calculation (min) 42 min    Equipment Utilized During Treatment Gait belt    Activity Tolerance Patient tolerated treatment well;No increased pain    Behavior During Therapy [facility] for tasks assessed/performed                      Past Medical History:  Diagnosis Date   Arthritis    osteo - shoulders, fingers   Atrial fibrillation ([place])    Dizziness    thinks because of diuretic   Family history of adverse reaction to anesthesia    daughter -PONV   Gallstones 2016, 2017   GERD (gastroesophageal reflux disease)    Glaucoma    Heart murmur    history of   Hypertension    Melanoma ([place]) 04/20/2016   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 Excised: 05/19/2016, margins free.   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   Tricuspid valve disorder    leak   Past Surgical History:  Procedure Laterality Date   ABDOMINAL HYSTERECTOMY  1987   partial   CARDIAC CATHETERIZATION  2010   [facility]   CATARACT EXTRACTION W/PHACO Right 08/28/2015   Procedure: CATARACT EXTRACTION PHACO AND INTRAOCULAR LENS PLACEMENT ([place]);  Surgeon: Leandrew Koyanagi, MD;  Location: [place];  Service: Ophthalmology;  Laterality: Right;  TORIC   INTRAMEDULLARY (IM) NAIL INTERTROCHANTERIC Right 10/16/2021   Procedure:  INTRAMEDULLARY (IM) NAIL INTERTROCHANTRIC;  Surgeon: Renee Harder, MD;  Location: [facility];  Service: Orthopedics;  Laterality: Right;   MELANOMA EXCISION Left 05/19/2016   MIS. Left mid. lat. tricep. 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 S/P IM nail placement for right intertrochanteric hip fracture with Dr. Sharlet Salina on 10/16/2021. Pt slipped on the kithen floor. Pt was getting something from the microwave and pt tripped on her O2 tube. Pt was independent with ambulation prior to her fall. Pt fell before in the woods and pt tripped over a root 3 years ago (no injuries per pt). Pt has been using supplemental O2 for the past 2 years in November. Pt has interstitial lung disease (idiopathic pulmonary fibrosis).  Recent breathing test is stable. Back pain began when pt was in Rehab in [place] [facility]. Feels like it is sciatical. Pt radiates along the sciatic nerve and R L 5 dermatome. Pt is healing fine as far as her R hip surgery is concerned. Also has R medial knee pain due to arthritis. Had home health PT which involves seated knee extension, hip flexion, standing hip abduction, extension. Pt did some walking wiht a rw. Feels a lot of pain all over. Walking too far is limited due to her O2 situation.  PRECAUTIONS: SpO2 not under 90 %  SUBJECTIVE: Back really bothered her yesterday. The scapular retraction exercise bothers her back as well. Leaning over makes her back feel better. L  shoulder has been bothering her this morning. Does not wear compression socks/hose at night. Feels like she is getting stronger      PAIN:  Are you having pain? 0/10 back pain currently. 0/10 R knee soreness     TODAY'S TREATMENT:   Therapeutic exercise  98% SpO2, 3 L, HR 64 at rest at start of session   Gait with SPC on L side, 3 L O2 60 ft CGA  No back pain  SpO2 88%, HR 71   Improved to 98% HR 61  After 2 min rest   Gait with SPC on L side, 3 L O2 60 ft CGA  No back pain  SpO2 84%/87%, HR 100   Improved to 94  After rest   O2 increased to 4 L   SpO2 improved to 99, HR 61     Sitting with upright posture   Manually resisted L upper trunk rotation to promote more neutral lumbar posture 10x3 with 5 second holds  97 % HR 61   O2 went back down to 3 L    Manually resisted trunk flexion isometrics 10x5 seconds for 2 sets  97 % O2, HR     Trunk rotation R and L 10x    Discontinued scapular retraction HEP. Will just perform in clinic   Seated B scapular retraction 10x5 seconds  At end of session:                        99% 3 L SpO2, HR 70    Pt changes O2 L based on need Improved exercise technique, movement at target joints, use of target muscles after min to mod verbal, visual, tactile cues.        Response to treatment Pt tolerated session well without aggravation of symptoms.      Clinical impression  Pt demonstrates overall decreased R LE pain, decreased duration of low back pain at worst, improved ability to ambulate with least restrictive device [facility]), and improved function since initial evaluation. Pt making progress with PT towards goals. Challenges to progress include pulmonary fibrosis and age. Pt tolerated session well without aggravation of symptoms. Pt will benefit from continued skilled physical therapy services to decrease pain, improve strength and function.      PATIENT EDUCATION: Education details: ther-ex, HEP Person  educated: Patient Education method: Explanation, Demonstration, Tactile cues, Verbal cues, and Handouts Education comprehension: verbalized understanding and returned demonstration   HOME EXERCISE PROGRAM: Access Code: [email or web address] URL: [email or web address] Date: 01/20/2022 Prepared by: Joneen Boers  Exercises - Seated Transversus Abdominis Bracing  - 1 x daily - 7 x weekly - 3 sets - 10 reps - 5 seconds hold - Seated Gluteal Sets  - 1 x daily - 7  x weekly - 3 sets - 10 reps - 5 seconds hold - Discontinued 03/12/2022: Seated Scapular Retraction  - 1 x daily - 7 x weekly - 3 sets - 10 reps - 5 seconds  hold  Can perform aforementioned exercises in standing now.    - Seated Hip Adduction Isometrics with Ball  - 1 x daily - 7 x weekly - 2 sets - 10 reps - 5 seconds hold - Standing Hip Abduction with Counter Support  - 1 x daily - 7 x weekly - 3 sets - 10 reps - Discontinued on 03/09/2022: Seated Hip Abduction with Resistance  - 1 x daily - 7 x weekly - 2 sets - 10 reps       PT Short Term Goals - 02/16/22 0941       PT SHORT TERM GOAL #1   Title Pt will be independent with her initial HEP to decrease pain, improve strength, function, and ability to stand and ambulate more comfortably.    Baseline No questions with HEP, has been doing them (02/16/2022)    Time 3    Period Weeks    Status Achieved    Target Date 02/05/22              PT Long Term Goals - 03/12/22 0809       PT LONG TERM GOAL #1   Title Pt will have a decrease in low back pain to 3/10 or less at worst to promote ability to stand and ambulate with less difficulty.    Baseline 6/10 low back pain at worst for the past 2 months (01/12/2022); 7/10 at most for the past 7 days (standing in the kitchen, more standing than normal becuase pt had guests over from [facility]) ( 02/16/2022);  6-7/10 back pain at worst for the past 7 days. Back pain at worst goes away faster since starting PT. (03/09/2022)    Time  8    Period Weeks    Status On-going    Target Date 05/07/22      PT LONG TERM GOAL #2   Title Pt will have a decrease in R LE pain to 3/10 or less at worst to promote ability to stand and ambulate will less difficulty.    Baseline 9/10 R LE pain at worst (01/12/2022); 3/10 R sciatic and L5 dermatome pain at most for the past 7 days; has R > L heel pain however which wakes her up at night. (02/16/2022); 4/10 R LE pain at most for the past 7 days. R heel pain however wakes her up at night. (03/12/2022)    Time 8    Period Weeks    Status Partially Met    Target Date 05/07/22      PT LONG TERM GOAL #3   Title Pt will improve her FOTO score by at least 10 points as a demontsration of improved function.    Baseline Lumbar Spine FOTO 39 (01/12/2022); 46 (02/16/2022); 52 (03/12/2022)    Time 8    Period Weeks    Status Achieved    Target Date 05/07/22      PT LONG TERM GOAL #4   Title Pt will be able to ambulate at least 100 ft independently to promote mobility and return to prior level of function.    Baseline Able to ambulate with rw short distance, about 40 ft with reproduction of pain. (01/12/2022); able to ambulate 120 ft with rw, SBA (02/16/2022); SPC 40 ft, CGA (03/12/2022)  Time 8    Period Weeks    Status On-going    Target Date 05/07/22              Plan - 03/12/22 0802     Clinical Impression Statement Pt demonstrates overall decreased R LE pain, decreased duration of low back pain at worst, improved ability to ambulate with least restrictive device [facility]), and improved function since initial evaluation. Pt making progress with PT towards goals. Challenges to progress include pulmonary fibrosis and age. Pt tolerated session well without aggravation of symptoms. Pt will benefit from continued skilled physical therapy services to decrease pain, improve strength and function.    Personal Factors and Comorbidities Comorbidity 3+;Age;Time since onset of injury/illness/exacerbation;Fitness     Comorbidities Arthritis, atrial fibrillation, HTN, idiopathic pulmonary fibrosis    Examination-Activity Limitations Stairs;Bed Mobility;Stand;Sit;Lift;Transfers;Locomotion Level;Carry    Stability/Clinical Decision Making Stable/Uncomplicated    Clinical Decision Making Low    Rehab Potential Fair    PT Frequency 2x / week    PT Duration 8 weeks    PT Treatment/Interventions Therapeutic activities;Therapeutic exercise;Gait training;Balance training;Neuromuscular re-education;Patient/family education;Manual techniques;Dry needling;Electrical Stimulation;Iontophoresis 55m/ml Dexamethasone    PT Next Visit Plan Posture, thoracic extension, trunk and glute strength, lumbopelvic control during gait, manual techniques, modalities PRN    PT Home Exercise Plan Medbridge Access Code: [email or web address]   Consulted and Agree with Plan of Care Patient                     MJoneen BoersPT, DPT  03/12/2022, 12:54 PM

## 2022-03-18 ENCOUNTER — Ambulatory Visit: Payer: Medicare PPO | Attending: Orthopedic Surgery

## 2022-03-18 DIAGNOSIS — M6281 Muscle weakness (generalized): Secondary | ICD-10-CM | POA: Insufficient documentation

## 2022-03-18 DIAGNOSIS — M5459 Other low back pain: Secondary | ICD-10-CM | POA: Diagnosis present

## 2022-03-18 DIAGNOSIS — M5417 Radiculopathy, lumbosacral region: Secondary | ICD-10-CM | POA: Insufficient documentation

## 2022-03-18 DIAGNOSIS — M5431 Sciatica, right side: Secondary | ICD-10-CM | POA: Insufficient documentation

## 2022-03-18 DIAGNOSIS — Z9181 History of falling: Secondary | ICD-10-CM | POA: Insufficient documentation

## 2022-03-18 DIAGNOSIS — R262 Difficulty in walking, not elsewhere classified: Secondary | ICD-10-CM | POA: Diagnosis present

## 2022-03-18 NOTE — Addendum Note (Signed)
Addended by: Madaline Savage on: 03/18/2022 01:51 PM   Modules accepted: Orders

## 2022-03-18 NOTE — Therapy (Signed)
OUTPATIENT PHYSICAL THERAPY TREATMENT NOTE    Patient Name: Joyce Evans MRN: [id number] DOB:10/27/1940, 81 y.o., female Today's Date: 03/18/2022  PCP: Derinda Late, MD  REFERRING PROVIDER: Leonie Green, NP   PT End of Session - 03/18/22 1011     Visit Number 16    Number of Visits 33    Date for PT Re-Evaluation 05/07/22    Authorization Type [employer]    Progress Note Due on Visit 10    PT Start Time 1013    PT Stop Time 1059    PT Time Calculation (min) 46 min    Equipment Utilized During Treatment Gait belt    Activity Tolerance Patient tolerated treatment well;No increased pain    Behavior During Therapy [facility] for tasks assessed/performed                       Past Medical History:  Diagnosis Date   Arthritis    osteo - shoulders, fingers   Atrial fibrillation ([place])    Dizziness    thinks because of diuretic   Family history of adverse reaction to anesthesia    daughter -PONV   Gallstones 2016, 2017   GERD (gastroesophageal reflux disease)    Glaucoma    Heart murmur    history of   Hypertension    Melanoma ([place]) 04/20/2016   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 Excised: 05/19/2016, margins free.   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   Tricuspid valve disorder    leak   Past Surgical History:  Procedure Laterality Date   ABDOMINAL HYSTERECTOMY  1987   partial   CARDIAC CATHETERIZATION  2010   [facility]   CATARACT EXTRACTION W/PHACO Right 08/28/2015   Procedure: CATARACT EXTRACTION PHACO AND INTRAOCULAR LENS PLACEMENT ([place]);  Surgeon: Leandrew Koyanagi, MD;  Location: [place];  Service: Ophthalmology;  Laterality: Right;  TORIC   INTRAMEDULLARY (IM) NAIL INTERTROCHANTERIC Right 10/16/2021   Procedure: INTRAMEDULLARY (IM) NAIL INTERTROCHANTRIC;  Surgeon: Renee Harder, MD;  Location: [facility];  Service: Orthopedics;  Laterality: Right;   MELANOMA EXCISION Left 05/19/2016   MIS. Left mid. lat. tricep. 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 S/P IM nail placement for right intertrochanteric hip fracture with Dr. Sharlet Salina on 10/16/2021. Pt slipped on the kithen floor. Pt was getting something from the microwave and pt tripped on her O2 tube. Pt was independent with ambulation prior to her fall. Pt fell before in the woods and pt tripped over a root 3 years ago (no injuries per pt). Pt has been using supplemental O2 for the past 2 years in November. Pt has interstitial lung disease (idiopathic pulmonary fibrosis). Recent breathing test is stable. Back pain began when pt  was in Rehab in [place] [facility]. Feels like it is sciatical. Pt radiates along the sciatic nerve and R L 5 dermatome. Pt is healing fine as far as her R hip surgery is concerned. Also has R medial knee pain due to arthritis. Had home health PT which involves seated knee extension, hip flexion, standing hip abduction, extension. Pt did some walking wiht a rw. Feels a lot of pain all over. Walking too far is limited due to her O2 situation.  PRECAUTIONS: SpO2 not under 90 %  SUBJECTIVE: Feels nauseaus this morning. R LE also bothered her last night. Was taken off gabapentin. Wakes up achy at times in the morning. Carvedilol (beta blocker) medicine also makes her feel "yucky." Has also  been doing 40 min of recombent bike at home which is helping.          PAIN:  Are you having pain? 0/10 back pain currently. 0/10 R knee soreness     TODAY'S TREATMENT:   Therapeutic exercise BP R arm sitting, normal cuff, mechanically taken: 135/63, HR 58 Oral temperature 98.2 degrees F. Mechanically taken.   98% SpO2, 3 L at start of session   Reclined   Hooklying    posterior pelvic tilt 10x3 with 5 second holds       Clamshell yellow band 10x3      Hip fallouts 10x each side.    Pt demonstrates R pelvic rotation during R hip fall out.     Hip posterior hip soreness with hip fallout.      Hip adduction isometrics 10x5 seconds for 3 sets    Hip extension isometrics, leg straight     R 10x2 with 5 seocnd holds    SpO2 98- 99% during exercises, 3 L O2        Pt changes O2 L based on need Improved exercise technique, movement at target joints, use of target muscles after min to mod verbal, visual, tactile cues.        Response to treatment Pt tolerated session well without aggravation of symptoms. Pt states feeling good after session.      Clinical impression Worked on core strengthening and lumbopelvic control secondary to weakness, especially when upright. Difficulty with core control R > L during hip fallout exercise. Good oxygenation on 3 L throughout session during hooklying exercises. Pt tolerated session well without aggravation of symptoms. Pt will benefit from continued skilled physical therapy services to decrease pain, improve strength and function.      PATIENT EDUCATION: Education details: ther-ex, HEP Person educated: Patient Education method: Explanation, Demonstration, Tactile cues, Verbal cues, and Handouts Education comprehension: verbalized understanding and returned demonstration   HOME EXERCISE PROGRAM: Access Code: [email or web address] URL: [email or web address] Date: 01/20/2022 Prepared by: Joneen Boers  Exercises -  Seated Transversus Abdominis Bracing  - 1 x daily - 7 x weekly - 3 sets - 10 reps - 5 seconds hold - Seated Gluteal Sets  - 1 x daily - 7 x weekly - 3 sets - 10 reps - 5 seconds hold - Discontinued 03/12/2022: Seated Scapular Retraction  - 1 x daily - 7 x weekly - 3 sets - 10 reps - 5 seconds  hold  Can perform aforementioned exercises in standing now.    - Seated Hip Adduction Isometrics with Ball  - 1 x daily - 7 x weekly - 2 sets - 10 reps - 5 seconds hold - Standing Hip Abduction with Counter Support  - 1 x daily - 7 x  weekly - 3 sets - 10 reps - Discontinued on 03/09/2022: Seated Hip Abduction with Resistance  - 1 x daily - 7 x weekly - 2 sets - 10 reps  - Supine Posterior Pelvic Tilt  - 3 x daily - 7 x weekly - 3 sets - 10 reps - 5 seconds hold - Hooklying Clamshell with Resistance  - 1 x daily - 7 x weekly - 3 sets - 10 reps    PT Short Term Goals - 02/16/22 0941       PT SHORT TERM GOAL #1   Title Pt will be independent with her initial HEP to decrease pain, improve strength, function, and ability to stand and ambulate more comfortably.    Baseline No questions with HEP, has been doing them (02/16/2022)    Time 3    Period Weeks    Status Achieved    Target Date 02/05/22              PT Long Term Goals - 03/12/22 0809       PT LONG TERM GOAL #1   Title Pt will have a decrease in low back pain to 3/10 or less at worst to promote ability to stand and ambulate with less difficulty.    Baseline 6/10 low back pain at worst for the past 2 months (01/12/2022); 7/10 at most for the past 7 days (standing in the kitchen, more standing than normal becuase pt had guests over from [facility]) ( 02/16/2022);  6-7/10 back pain at worst for the past 7 days. Back pain at worst goes away faster since starting PT. (03/09/2022)    Time 8    Period Weeks    Status On-going    Target Date 05/07/22      PT LONG TERM GOAL #2   Title Pt will have a decrease in R LE pain to 3/10 or less at worst  to promote ability to stand and ambulate will less difficulty.    Baseline 9/10 R LE pain at worst (01/12/2022); 3/10 R sciatic and L5 dermatome pain at most for the past 7 days; has R > L heel pain however which wakes her up at night. (02/16/2022); 4/10 R LE pain at most for the past 7 days. R heel pain however wakes her up at night. (03/12/2022)    Time 8    Period Weeks    Status Partially Met    Target Date 05/07/22      PT LONG TERM GOAL #3   Title Pt will improve her FOTO score by at least 10 points as a demontsration of improved function.    Baseline Lumbar Spine FOTO 39 (01/12/2022); 46 (02/16/2022); 52 (03/12/2022)    Time 8    Period Weeks    Status Achieved    Target Date 05/07/22      PT LONG TERM GOAL #4   Title Pt will be able to ambulate at least 100 ft independently to promote mobility and return to prior level of function.    Baseline Able to ambulate with rw short distance, about 40 ft with reproduction of pain. (01/12/2022); able to ambulate 120 ft with rw, SBA (02/16/2022); SPC 40 ft, CGA (03/12/2022)    Time 8    Period Weeks    Status On-going    Target Date 05/07/22              Plan - 03/18/22 1006     Clinical Impression Statement Worked on core  strengthening and lumbopelvic control secondary to weakness, especially when upright. Difficulty with core control R > L during hip fallout exercise. Good oxygenation on 3 L throughout session during hooklying exercises. Pt tolerated session well without aggravation of symptoms. Pt will benefit from continued skilled physical therapy services to decrease pain, improve strength and function.    Personal Factors and Comorbidities Comorbidity 3+;Age;Time since onset of injury/illness/exacerbation;Fitness    Comorbidities Arthritis, atrial fibrillation, HTN, idiopathic pulmonary fibrosis    Examination-Activity Limitations Stairs;Bed Mobility;Stand;Sit;Lift;Transfers;Locomotion Level;Carry    Stability/Clinical Decision Making  Stable/Uncomplicated    Clinical Decision Making Low    Rehab Potential Fair    PT Frequency 2x / week    PT Duration 8 weeks    PT Treatment/Interventions Therapeutic activities;Therapeutic exercise;Gait training;Balance training;Neuromuscular re-education;Patient/family education;Manual techniques;Dry needling;Electrical Stimulation;Iontophoresis 63m/ml Dexamethasone    PT Next Visit Plan Posture, thoracic extension, trunk and glute strength, lumbopelvic control during gait, manual techniques, modalities PRN    PT Home Exercise Plan Medbridge Access Code: [email or web address]   Consulted and Agree with Plan of Care Patient                      MJoneen BoersPT, DPT  03/18/2022, 1:59 PM

## 2022-03-23 ENCOUNTER — Ambulatory Visit: Payer: Medicare PPO

## 2022-03-23 DIAGNOSIS — M5459 Other low back pain: Secondary | ICD-10-CM | POA: Diagnosis not present

## 2022-03-23 DIAGNOSIS — M5417 Radiculopathy, lumbosacral region: Secondary | ICD-10-CM

## 2022-03-23 DIAGNOSIS — M5431 Sciatica, right side: Secondary | ICD-10-CM

## 2022-03-23 DIAGNOSIS — R262 Difficulty in walking, not elsewhere classified: Secondary | ICD-10-CM

## 2022-03-23 DIAGNOSIS — M6281 Muscle weakness (generalized): Secondary | ICD-10-CM

## 2022-03-23 DIAGNOSIS — Z9181 History of falling: Secondary | ICD-10-CM

## 2022-03-23 NOTE — Therapy (Signed)
OUTPATIENT PHYSICAL THERAPY TREATMENT NOTE    Patient Name: Joyce Evans MRN: [id number] DOB:02-Feb-1941, 81 y.o., female Today's Date: 03/23/2022  PCP: Derinda Late, MD  REFERRING PROVIDER: Leonie Green, NP   PT End of Session - 03/23/22 1412     Visit Number 17    Number of Visits 33    Date for PT Re-Evaluation 05/07/22    Authorization Type [employer]    Progress Note Due on Visit 10    PT Start Time 1412    PT Stop Time 1501    PT Time Calculation (min) 49 min    Equipment Utilized During Treatment Gait belt    Activity Tolerance Patient tolerated treatment well;No increased pain    Behavior During Therapy [facility] for tasks assessed/performed                        Past Medical History:  Diagnosis Date   Arthritis    osteo - shoulders, fingers   Atrial fibrillation ([place])    Dizziness    thinks because of diuretic   Family history of adverse reaction to anesthesia    daughter -PONV   Gallstones 2016, 2017   GERD (gastroesophageal reflux disease)    Glaucoma    Heart murmur    history of   Hypertension    Melanoma ([place]) 04/20/2016   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 Excised: 05/19/2016, margins free.   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   Tricuspid valve disorder    leak   Past Surgical History:  Procedure Laterality Date   ABDOMINAL HYSTERECTOMY  1987   partial   CARDIAC CATHETERIZATION  2010   [facility]   CATARACT EXTRACTION W/PHACO Right 08/28/2015   Procedure: CATARACT EXTRACTION PHACO AND INTRAOCULAR LENS PLACEMENT ([place]);  Surgeon: Leandrew Koyanagi, MD;  Location: [place];  Service: Ophthalmology;  Laterality: Right;  TORIC   INTRAMEDULLARY (IM) NAIL INTERTROCHANTERIC Right 10/16/2021   Procedure: INTRAMEDULLARY (IM) NAIL INTERTROCHANTRIC;  Surgeon: Renee Harder, MD;  Location: [facility];  Service: Orthopedics;  Laterality: Right;   MELANOMA EXCISION Left 05/19/2016   MIS. Left mid. lat. tricep. 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 S/P IM nail placement for right intertrochanteric hip fracture with Dr. Sharlet Salina on 10/16/2021. Pt slipped on the kithen floor. Pt was getting something from the microwave and pt tripped on her O2 tube. Pt was independent with ambulation prior to her fall. Pt fell before in the woods and pt tripped over a root 3 years ago (no injuries per pt). Pt has been using supplemental O2 for the past 2 years in November. Pt has interstitial lung disease (idiopathic pulmonary fibrosis). Recent breathing test is stable. Back pain began when  pt was in Rehab in [place] [facility]. Feels like it is sciatical. Pt radiates along the sciatic nerve and R L 5 dermatome. Pt is healing fine as far as her R hip surgery is concerned. Also has R medial knee pain due to arthritis. Had home health PT which involves seated knee extension, hip flexion, standing hip abduction, extension. Pt did some walking wiht a rw. Feels a lot of pain all over. Walking too far is limited due to her O2 situation.  PRECAUTIONS: SpO2 not under 90 %  SUBJECTIVE: Did not use her wc when she went to [place]. Used a walker. Did not go anywhere except to eat. Was able to go down and up up 5 steps with her [facility] and a rail and CGA with her daughter, O2 went down to  94% on 4 L. Pt walked distances of about 50 ft. Back was bothering her some this morning. Standing and leaning forward also bothers her back.         PAIN:  Are you having pain? 0/10 back pain currently. 0/10 R knee soreness     TODAY'S TREATMENT:     Gait training:   99% SpO2, 4 L, HR 62  at start of session  SPC adjusted to appropriate height  Gait with SPC on L side, 4 L O2 76 ft CGA                No back pain                SpO2 86%,                 Improved to 97% HR 61                After about 2 min rest    Gait with SPC on L side, 4 L O2 76 ft CGA                No back pain                SpO2 86%                 Improved to 96%                 After about 2 min rest   Rollator adjusted to appropriate height. Gait with rollator 100 ft x 2 CGA to SBA, with pt practicing to sit down   4 L O2, 86% after each bout, improves to 98 % with about 2 minutes rest.     Improved exercise technique, movement at target joints, use of target muscles after mod verbal, visual, tactile cues.    Therapeutic exercise  SLS with B UE assist   R 10x5 seconds for 2 sets to promote R glute med muscel strength during R LE stance phase and decrease stress to her low back    SpO2 91%, improves to 98 % after rest.    Then 10x5 seconds more    Sitting with upright posture                  Manually resisted L upper trunk rotation to promote more neutral lumbar posture 10x3 with 5 second holds      SpO2 98%, at end of session, 4L O2    Pt changes O2 L based on need Improved exercise technique, movement at target joints, use of target muscles after min to mod verbal, visual, tactile cues.  Response to treatment Pt tolerated session well without aggravation of symptoms.      Clinical impression Continued working gait with least restrictive device. Able to ambulate 76 ft twice using SPC today. Worked on gait with rollator as well with pt sitting at the  end of each 100 ft bout, cues to lock brakes prior to sitting secondary to pt expressing desire to not use her wc anymore. No LOB with gait, limiting factor seems to be her oxygenation.  Continued working on posture, trunk and R glute med strength to decrease stress to low back during gait and standing tasks. Pt tolerated session well without aggravation of symptoms. Pt will benefit from continued skilled physical therapy services to decrease pain, improve strength and function.      PATIENT EDUCATION: Education details: ther-ex, HEP Person educated: Patient Education method: Explanation, Demonstration, Tactile cues, Verbal cues, and Handouts Education comprehension: verbalized understanding and returned demonstration   HOME EXERCISE PROGRAM: Access Code: [email or web address] URL: [email or web address] Date: 01/20/2022 Prepared by: Joneen Boers  Exercises - Seated Transversus Abdominis Bracing  - 1 x daily - 7 x weekly - 3 sets - 10 reps - 5 seconds hold - Seated Gluteal Sets  - 1 x daily - 7 x weekly - 3 sets - 10 reps - 5 seconds hold - Discontinued 03/12/2022: Seated Scapular Retraction  - 1 x daily - 7 x weekly - 3 sets - 10 reps - 5 seconds  hold  Can perform aforementioned exercises in standing now.    - Seated Hip Adduction Isometrics with Ball  - 1 x daily - 7 x weekly - 2 sets - 10 reps - 5 seconds hold - Standing Hip Abduction with Counter Support  - 1 x daily - 7 x weekly - 3 sets - 10 reps - Discontinued on 03/09/2022: Seated Hip Abduction with Resistance  - 1 x daily - 7 x weekly - 2 sets - 10 reps  - Supine Posterior Pelvic Tilt  - 3 x daily - 7 x weekly - 3 sets - 10 reps - 5 seconds hold - Hooklying Clamshell with Resistance  - 1 x daily - 7 x weekly - 3 sets - 10 reps    PT Short Term Goals - 02/16/22 0941       PT SHORT TERM GOAL #1   Title Pt will be independent with her initial HEP to decrease pain, improve strength, function, and ability to stand and  ambulate more comfortably.    Baseline No questions with HEP, has been doing them (02/16/2022)    Time 3    Period Weeks    Status Achieved    Target Date 02/05/22              PT Long Term Goals - 03/12/22 0809       PT LONG TERM GOAL #1   Title Pt will have a decrease in low back pain to 3/10 or less at worst to promote ability to stand and ambulate with less difficulty.    Baseline 6/10 low back pain at worst for the past 2 months (01/12/2022); 7/10 at most for the past 7 days (standing in the kitchen, more standing than normal becuase pt had guests over from [facility]) ( 02/16/2022);  6-7/10 back pain at worst for the past 7 days. Back pain at worst goes away faster since starting PT. (03/09/2022)    Time 8    Period Weeks    Status On-going  Target Date 05/07/22      PT LONG TERM GOAL #2   Title Pt will have a decrease in R LE pain to 3/10 or less at worst to promote ability to stand and ambulate will less difficulty.    Baseline 9/10 R LE pain at worst (01/12/2022); 3/10 R sciatic and L5 dermatome pain at most for the past 7 days; has R > L heel pain however which wakes her up at night. (02/16/2022); 4/10 R LE pain at most for the past 7 days. R heel pain however wakes her up at night. (03/12/2022)    Time 8    Period Weeks    Status Partially Met    Target Date 05/07/22      PT LONG TERM GOAL #3   Title Pt will improve her FOTO score by at least 10 points as a demontsration of improved function.    Baseline Lumbar Spine FOTO 39 (01/12/2022); 46 (02/16/2022); 52 (03/12/2022)    Time 8    Period Weeks    Status Achieved    Target Date 05/07/22      PT LONG TERM GOAL #4   Title Pt will be able to ambulate at least 100 ft independently to promote mobility and return to prior level of function.    Baseline Able to ambulate with rw short distance, about 40 ft with reproduction of pain. (01/12/2022); able to ambulate 120 ft with rw, SBA (02/16/2022); SPC 40 ft, CGA (03/12/2022)    Time 8     Period Weeks    Status On-going    Target Date 05/07/22              Plan - 03/23/22 1409     Clinical Impression Statement Continued working gait with least restrictive device. Able to ambulate 76 ft twice using SPC today. Worked on gait with rollator as well with pt sitting at the end of each 100 ft bout, cues to lock brakes prior to sitting secondary to pt expressing desire to not use her wc anymore. No LOB with gait, limiting factor seems to be her oxygenation.  Continued working on posture, trunk and R glute med strength to decrease stress to low back during gait and standing tasks. Pt tolerated session well without aggravation of symptoms. Pt will benefit from continued skilled physical therapy services to decrease pain, improve strength and function.    Personal Factors and Comorbidities Comorbidity 3+;Age;Time since onset of injury/illness/exacerbation;Fitness    Comorbidities Arthritis, atrial fibrillation, HTN, idiopathic pulmonary fibrosis    Examination-Activity Limitations Stairs;Bed Mobility;Stand;Sit;Lift;Transfers;Locomotion Level;Carry    Stability/Clinical Decision Making Stable/Uncomplicated    Clinical Decision Making Low    Rehab Potential Fair    PT Frequency 2x / week    PT Duration 8 weeks    PT Treatment/Interventions Therapeutic activities;Therapeutic exercise;Gait training;Balance training;Neuromuscular re-education;Patient/family education;Manual techniques;Dry needling;Electrical Stimulation;Iontophoresis 69m/ml Dexamethasone    PT Next Visit Plan Posture, thoracic extension, trunk and glute strength, lumbopelvic control during gait, manual techniques, modalities PRN    PT Home Exercise Plan Medbridge Access Code: [email or web address]   Consulted and Agree with Plan of Care Patient                       MJoneen BoersPT, DPT  03/23/2022, 3:16 PM

## 2022-03-26 ENCOUNTER — Ambulatory Visit: Payer: Medicare PPO

## 2022-03-26 DIAGNOSIS — M5417 Radiculopathy, lumbosacral region: Secondary | ICD-10-CM

## 2022-03-26 DIAGNOSIS — M5459 Other low back pain: Secondary | ICD-10-CM | POA: Diagnosis not present

## 2022-03-26 DIAGNOSIS — R262 Difficulty in walking, not elsewhere classified: Secondary | ICD-10-CM

## 2022-03-26 DIAGNOSIS — Z9181 History of falling: Secondary | ICD-10-CM

## 2022-03-26 DIAGNOSIS — M6281 Muscle weakness (generalized): Secondary | ICD-10-CM

## 2022-03-26 DIAGNOSIS — M5431 Sciatica, right side: Secondary | ICD-10-CM

## 2022-03-26 NOTE — Therapy (Signed)
OUTPATIENT PHYSICAL THERAPY TREATMENT NOTE    Patient Name: Joyce Evans MRN: [id number] DOB:1941/05/10, 81 y.o., female Today's Date: 03/26/2022  PCP: Derinda Late, MD  REFERRING PROVIDER: Leonie Green, NP   PT End of Session - 03/26/22 0809     Visit Number 18    Number of Visits 33    Date for PT Re-Evaluation 05/07/22    Authorization Type [employer]    Progress Note Due on Visit 10    PT Start Time 0809    PT Stop Time 0900    PT Time Calculation (min) 51 min    Equipment Utilized During Treatment Gait belt    Activity Tolerance Patient tolerated treatment well;No increased pain    Behavior During Therapy [facility] for tasks assessed/performed                         Past Medical History:  Diagnosis Date   Arthritis    osteo - shoulders, fingers   Atrial fibrillation ([place])    Dizziness    thinks because of diuretic   Family history of adverse reaction to anesthesia    daughter -PONV   Gallstones 2016, 2017   GERD (gastroesophageal reflux disease)    Glaucoma    Heart murmur    history of   Hypertension    Melanoma ([place]) 04/20/2016   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 Excised: 05/19/2016, margins free.   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   Tricuspid valve disorder    leak   Past Surgical History:  Procedure Laterality Date   ABDOMINAL HYSTERECTOMY  1987   partial   CARDIAC CATHETERIZATION  2010   [facility]   CATARACT EXTRACTION W/PHACO Right 08/28/2015   Procedure: CATARACT EXTRACTION PHACO AND INTRAOCULAR LENS PLACEMENT ([place]);  Surgeon: Leandrew Koyanagi, MD;  Location: [facility];  Service: Ophthalmology;  Laterality: Right;  TORIC   INTRAMEDULLARY (IM) NAIL INTERTROCHANTERIC Right 10/16/2021   Procedure: INTRAMEDULLARY (IM) NAIL INTERTROCHANTRIC;  Surgeon: Renee Harder, MD;  Location: [facility];  Service: Orthopedics;  Laterality: Right;   MELANOMA EXCISION Left 05/19/2016   MIS. Left mid. lat. tricep. 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 S/P IM nail placement for right intertrochanteric hip fracture with Dr. Sharlet Salina on 10/16/2021. Pt slipped on the kithen floor. Pt was getting something from the microwave and pt tripped on her O2 tube. Pt was independent with ambulation prior to her fall. Pt fell before in the woods and pt tripped over a root 3 years ago (no injuries per pt). Pt has been using supplemental O2 for the past 2 years in November. Pt has interstitial lung disease (idiopathic pulmonary fibrosis). Recent breathing test is stable. Back pain began  when pt was in Rehab in [place] [facility]. Feels like it is sciatical. Pt radiates along the sciatic nerve and R L 5 dermatome. Pt is healing fine as far as her R hip surgery is concerned. Also has R medial knee pain due to arthritis. Had home health PT which involves seated knee extension, hip flexion, standing hip abduction, extension. Pt did some walking wiht a rw. Feels a lot of pain all over. Walking too far is limited due to her O2 situation.  PRECAUTIONS: SpO2 not under 90 %  SUBJECTIVE: Went to her pulmonary doctor yesterday. The capacity of her lungs is the same. The diffusion dropped a little bit probably because of her accident. Pt was told to keep moving around as much as she  could. Try not to  get under 88% SpO2. Can do anything she can do but not to get under 88%. Can take her O2 off when sitting so long as it does not go under 88%. Back is fine now. R upper back around the shoulder blade bothered her this morning under her R shoulder blade such as when she is doing certain things such as brushing her teeth or doing her make up. O2 levels do not go down when she does her recumbent bike when sitting down. Using the [facility] more at home rather than the walker. Low back did not bother her this morning.         PAIN:  Are you having pain? 0/10 back pain currently. 0/10 R knee soreness     TODAY'S TREATMENT:    Therapeutic exercise   Seated manually resisted scapular retraction targeting the lower trap  R 10x3 with 5 second holds   SpO2 98 %, HR 64, 3 L O2   Seated chin tucks with B scapular retraction 10x3 with 5 second holds   Standing B shoulder extension with B scapular retraction yellow band 10x5 seconds for 3 sets  Decreased low back pressure with posterior pelvic tilt  Standing posterior pelvic tilt 10x5 seconds for 3 sets   Then with B shoulder horizontal abduction 10x5 seconds for 3 sets     Pt changes O2 L based on need Improved exercise technique, movement at target joints, use of target muscles after min to mod verbal, visual, tactile cues.          Response to treatment Pt tolerated session well without aggravation of symptoms.  Neck feels more lose after session reported     Clinical impression  Worked on improving scapular strength, thoracic extension, trunk strength and posterior pelvic tilt to decrease lumbar stress in standing. Worked on lower trap strengthening to promote ability to use R UE for tasks such as brushing her teeth and putting her make up with less scapular area pain. Pt tolerated session well without aggravation of symptoms. Decreased neck tightness reported after session. Pt will benefit from continued  skilled physical therapy services to decrease pain, improve strength and function.      PATIENT EDUCATION: Education details: ther-ex, HEP Person educated: Patient Education method: Explanation, Demonstration, Tactile cues, Verbal cues, and Handouts Education comprehension: verbalized understanding and returned demonstration   HOME EXERCISE PROGRAM: Access Code: [email or web address] URL: [email or web address] Date: 01/20/2022 Prepared by: Joneen Boers  Exercises - Seated Transversus Abdominis Bracing  - 1 x daily - 7 x weekly - 3 sets - 10 reps - 5 seconds hold - Seated Gluteal Sets  - 1 x daily - 7 x weekly - 3 sets - 10 reps -  5 seconds hold - Discontinued 03/12/2022: Seated Scapular Retraction  - 1 x daily - 7 x weekly - 3 sets - 10 reps - 5 seconds  hold  Can perform aforementioned exercises in standing now.    - Seated Hip Adduction Isometrics with Ball  - 1 x daily - 7 x weekly - 2 sets - 10 reps - 5 seconds hold - Standing Hip Abduction with Counter Support  - 1 x daily - 7 x weekly - 3 sets - 10 reps - Discontinued on 03/09/2022: Seated Hip Abduction with Resistance  - 1 x daily - 7 x weekly - 2 sets - 10 reps  - Supine Posterior Pelvic Tilt  - 3 x daily - 7 x weekly - 3 sets - 10 reps - 5 seconds hold - Hooklying Clamshell with Resistance  - 1 x daily - 7 x weekly - 3 sets - 10 reps - Standing Posterior Pelvic Tilt  - 3 x daily - 7 x weekly - 3 sets - 10 reps - 5 seconds hold   PT Short Term Goals - 02/16/22 0941       PT SHORT TERM GOAL #1   Title Pt will be independent with her initial HEP to decrease pain, improve strength, function, and ability to stand and ambulate more comfortably.    Baseline No questions with HEP, has been doing them (02/16/2022)    Time 3    Period Weeks    Status Achieved    Target Date 02/05/22              PT Long Term Goals - 03/12/22 0809       PT LONG TERM GOAL #1   Title Pt will have a decrease in low back pain to 3/10 or  less at worst to promote ability to stand and ambulate with less difficulty.    Baseline 6/10 low back pain at worst for the past 2 months (01/12/2022); 7/10 at most for the past 7 days (standing in the kitchen, more standing than normal becuase pt had guests over from [facility]) ( 02/16/2022);  6-7/10 back pain at worst for the past 7 days. Back pain at worst goes away faster since starting PT. (03/09/2022)    Time 8    Period Weeks    Status On-going    Target Date 05/07/22      PT LONG TERM GOAL #2   Title Pt will have a decrease in R LE pain to 3/10 or less at worst to promote ability to stand and ambulate will less difficulty.    Baseline 9/10 R LE pain at worst (01/12/2022); 3/10 R sciatic and L5 dermatome pain at most for the past 7 days; has R > L heel pain however which wakes her up at night. (02/16/2022); 4/10 R LE pain at most for the past 7 days. R heel pain however wakes her up at night. (03/12/2022)    Time 8    Period Weeks    Status Partially Met    Target Date 05/07/22      PT LONG TERM GOAL #3   Title Pt will improve her FOTO score by at least 10 points as a demontsration of improved function.    Baseline Lumbar Spine FOTO 39 (01/12/2022); 46 (02/16/2022); 52 (03/12/2022)    Time 8    Period Weeks    Status Achieved    Target Date 05/07/22      PT LONG TERM GOAL #4  Title Pt will be able to ambulate at least 100 ft independently to promote mobility and return to prior level of function.    Baseline Able to ambulate with rw short distance, about 40 ft with reproduction of pain. (01/12/2022); able to ambulate 120 ft with rw, SBA (02/16/2022); SPC 40 ft, CGA (03/12/2022)    Time 8    Period Weeks    Status On-going    Target Date 05/07/22              Plan - 03/26/22 0908     Clinical Impression Statement Worked on improving scapular strength, thoracic extension, trunk strength and posterior pelvic tilt to decrease lumbar stress in standing. Worked on lower trap strengthening  to promote ability to use R UE for tasks such as brushing her teeth and putting her make up with less scapular area pain. Pt tolerated session well without aggravation of symptoms. Decreased neck tightness reported after session. Pt will benefit from continued skilled physical therapy services to decrease pain, improve strength and    Personal Factors and Comorbidities Comorbidity 3+;Age;Time since onset of injury/illness/exacerbation;Fitness    Comorbidities Arthritis, atrial fibrillation, HTN, idiopathic pulmonary fibrosis    Examination-Activity Limitations Stairs;Bed Mobility;Stand;Sit;Lift;Transfers;Locomotion Level;Carry    Stability/Clinical Decision Making Stable/Uncomplicated    Rehab Potential Fair    PT Frequency 2x / week    PT Duration 8 weeks    PT Treatment/Interventions Therapeutic activities;Therapeutic exercise;Gait training;Balance training;Neuromuscular re-education;Patient/family education;Manual techniques;Dry needling;Electrical Stimulation;Iontophoresis 75m/ml Dexamethasone    PT Next Visit Plan Posture, thoracic extension, trunk and glute strength, lumbopelvic control during gait, manual techniques, modalities PRN    PT Home Exercise Plan Medbridge Access Code: [email or web address]   Consulted and Agree with Plan of Care Patient                        MJoneen BoersPT, DPT  03/26/2022, 9:09 AM

## 2022-03-31 ENCOUNTER — Ambulatory Visit: Payer: Medicare PPO

## 2022-03-31 DIAGNOSIS — Z9181 History of falling: Secondary | ICD-10-CM

## 2022-03-31 DIAGNOSIS — M6281 Muscle weakness (generalized): Secondary | ICD-10-CM

## 2022-03-31 DIAGNOSIS — M5431 Sciatica, right side: Secondary | ICD-10-CM

## 2022-03-31 DIAGNOSIS — R262 Difficulty in walking, not elsewhere classified: Secondary | ICD-10-CM

## 2022-03-31 DIAGNOSIS — M5417 Radiculopathy, lumbosacral region: Secondary | ICD-10-CM

## 2022-03-31 DIAGNOSIS — M5459 Other low back pain: Secondary | ICD-10-CM | POA: Diagnosis not present

## 2022-03-31 NOTE — Therapy (Signed)
OUTPATIENT PHYSICAL THERAPY TREATMENT NOTE    Patient Name: Joyce Evans MRN: [id number] DOB:1941-03-27, 81 y.o., female Today's Date: 03/31/2022  PCP: Derinda Late, MD  REFERRING PROVIDER: Leonie Green, NP   PT End of Session - 03/31/22 1329     Visit Number 19    Number of Visits 33    Date for PT Re-Evaluation 05/07/22    Authorization Type [employer]    Progress Note Due on Visit 10    PT Start Time 1329    PT Stop Time 1420    PT Time Calculation (min) 51 min    Equipment Utilized During Treatment Gait belt    Activity Tolerance Patient tolerated treatment well;No increased pain    Behavior During Therapy [facility] for tasks assessed/performed                          Past Medical History:  Diagnosis Date   Arthritis    osteo - shoulders, fingers   Atrial fibrillation ([place])    Dizziness    thinks because of diuretic   Family history of adverse reaction to anesthesia    daughter -PONV   Gallstones 2016, 2017   GERD (gastroesophageal reflux disease)    Glaucoma    Heart murmur    history of   Hypertension    Melanoma ([place]) 04/20/2016   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 Excised: 05/19/2016, margins free.   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   Tricuspid valve disorder    leak   Past Surgical History:  Procedure Laterality Date   ABDOMINAL HYSTERECTOMY  1987   partial   CARDIAC CATHETERIZATION  2010   [facility]   CATARACT EXTRACTION W/PHACO Right 08/28/2015   Procedure: CATARACT EXTRACTION PHACO AND INTRAOCULAR LENS PLACEMENT ([place]);  Surgeon: Leandrew Koyanagi, MD;  Location: [facility];  Service: Ophthalmology;  Laterality: Right;  TORIC   INTRAMEDULLARY (IM) NAIL INTERTROCHANTERIC Right 10/16/2021   Procedure: INTRAMEDULLARY (IM) NAIL INTERTROCHANTRIC;  Surgeon: Renee Harder, MD;  Location: [facility];  Service: Orthopedics;  Laterality: Right;   MELANOMA EXCISION Left 05/19/2016   MIS. Left mid. lat. tricep. 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 S/P IM nail placement for right intertrochanteric hip fracture with Dr. Sharlet Salina on 10/16/2021. Pt slipped on the kithen floor. Pt was getting something from the microwave and pt tripped on her O2 tube. Pt was independent with ambulation prior to her fall. Pt fell before in the woods and pt tripped over a root 3 years ago (no injuries per pt). Pt has been using supplemental O2 for the past 2 years in November. Pt has interstitial lung disease (idiopathic pulmonary fibrosis). Recent breathing test is stable. Back pain  began when pt was in Rehab in [place] [facility]. Feels like it is sciatical. Pt radiates along the sciatic nerve and R L 5 dermatome. Pt is healing fine as far as her R hip surgery is concerned. Also has R medial knee pain due to arthritis. Had home health PT which involves seated knee extension, hip flexion, standing hip abduction, extension. Pt did some walking wiht a rw. Feels a lot of pain all over. Walking too far is limited due to her O2 situation.  PRECAUTIONS: SpO2 not under 90 %  SUBJECTIVE: Back has not been bothering her. Has been doing her exercises. 5/10 at worst for the past 7 days. 2-3/10 at worst since Friday (5 days ago). The pelvic tilt helps in standing. 5/10 hip pain (no R thigh  pain) at worst for the past 7 days. R heel has been bothering her, usually at night. Rubbing her R heel makes it feel better. No R thigh pain. R knee bothers her.       PAIN:  Are you having pain? 0/10 back pain currently. 0/10 R knee soreness. No R heel pain.      TODAY'S TREATMENT:    Manual therapy   Seated STM R heel and plantar foot to improve fascial mobility  Therapeutic exercise   Seated R ankle gastroc stretch 30 seconds x 2    SpO2 99 %, HR 51, 3 L O2   Gait with SPC on L side, 4 L O2 88 ft CGA                No back pain                SpO2 86%,                 Improved to 96%                After about 2 min rest and increasing to 4 L    O2 4 L  Gait with SPC on L side, 4 L O2 40 ft CGA                No back pain                SpO2 88%,                 Improved to 98%                After about 2 min rest  Seated chin tucks with B scapular retraction 10x3 with 5 second holds   Standing B shoulder extension with B scapular retraction yellow band 10x5 seconds for 3 sets  Decreased low back pressure with posterior pelvic tilt   Standing with B UE assist   R hip abduction 10x, then 10x5 seconds    L hip pressure felt in standing.   98% SpO2 4 L after session.     Pt changes O2 L based on need Improved exercise technique, movement at target joints, use of target muscles after min to mod verbal, visual, tactile cues.      Response to treatment Pt tolerated session well without aggravation of symptoms.      Clinical impression   Improving low back pain and decreased R LE radiating symptoms based on subjective reports. R hip pain seems to be localized at the joint. Continued working on improving thoracic extension, trunk and glute strength and posterior pelvic tilt to decrease lumbar extension stress. Pt able  to ambulate up to 88 ft today with SPC, CGA. Pt tolerated session well without aggravation of symptoms. Pt will benefit from  continued skilled physical therapy services to decrease pain, improve strength and function.      PATIENT EDUCATION: Education details: ther-ex, HEP Person educated: Patient Education method: Explanation, Demonstration, Tactile cues, Verbal cues, and Handouts Education comprehension: verbalized understanding and returned demonstration   HOME EXERCISE PROGRAM: Access Code: [email or web address] URL: [email or web address] Date: 01/20/2022 Prepared by: Joneen Boers  Exercises - Seated Transversus Abdominis Bracing  - 1 x daily - 7 x weekly - 3 sets - 10 reps - 5 seconds hold - Seated Gluteal Sets  - 1 x daily - 7 x weekly - 3 sets - 10 reps - 5 seconds hold - Discontinued 03/12/2022: Seated Scapular Retraction  - 1 x daily - 7 x weekly - 3 sets - 10 reps - 5 seconds  hold  Can perform aforementioned exercises in standing now.    - Seated Hip Adduction Isometrics with Ball  - 1 x daily - 7 x weekly - 2 sets - 10 reps - 5 seconds hold - Standing Hip Abduction with Counter Support  - 1 x daily - 7 x weekly - 3 sets - 10 reps - Discontinued on 03/09/2022: Seated Hip Abduction with Resistance  - 1 x daily - 7 x weekly - 2 sets - 10 reps  - Supine Posterior Pelvic Tilt  - 3 x daily - 7 x weekly - 3 sets - 10 reps - 5 seconds hold - Hooklying Clamshell with Resistance  - 1 x daily - 7 x weekly - 3 sets - 10 reps - Standing Posterior Pelvic Tilt  - 3 x daily - 7 x weekly - 3 sets - 10 reps - 5 seconds hold   PT Short Term Goals - 02/16/22 0941       PT SHORT TERM GOAL #1   Title Pt will be independent with her initial HEP to decrease pain, improve strength, function, and ability to stand and ambulate more comfortably.    Baseline No questions with HEP, has been doing them (02/16/2022)    Time 3    Period Weeks    Status Achieved    Target Date 02/05/22              PT Long Term Goals - 03/12/22 0809       PT LONG TERM GOAL #1   Title Pt will have a decrease in low back pain  to 3/10 or less at worst to promote ability to stand and ambulate with less difficulty.    Baseline 6/10 low back pain at worst for the past 2 months (01/12/2022); 7/10 at most for the past 7 days (standing in the kitchen, more standing than normal becuase pt had guests over from [facility]) ( 02/16/2022);  6-7/10 back pain at worst for the past 7 days. Back pain at worst goes away faster since starting PT. (03/09/2022)    Time 8    Period Weeks    Status On-going    Target Date 05/07/22      PT LONG TERM GOAL #2   Title Pt will have a decrease in R LE pain to 3/10 or less at worst to promote ability to stand and ambulate will less difficulty.    Baseline 9/10 R LE pain at worst (01/12/2022); 3/10 R sciatic and L5 dermatome pain at most for the past 7 days; has R > L  heel pain however which wakes her up at night. (02/16/2022); 4/10 R LE pain at most for the past 7 days. R heel pain however wakes her up at night. (03/12/2022)    Time 8    Period Weeks    Status Partially Met    Target Date 05/07/22      PT LONG TERM GOAL #3   Title Pt will improve her FOTO score by at least 10 points as a demontsration of improved function.    Baseline Lumbar Spine FOTO 39 (01/12/2022); 46 (02/16/2022); 52 (03/12/2022)    Time 8    Period Weeks    Status Achieved    Target Date 05/07/22      PT LONG TERM GOAL #4   Title Pt will be able to ambulate at least 100 ft independently to promote mobility and return to prior level of function.    Baseline Able to ambulate with rw short distance, about 40 ft with reproduction of pain. (01/12/2022); able to ambulate 120 ft with rw, SBA (02/16/2022); SPC 40 ft, CGA (03/12/2022)    Time 8    Period Weeks    Status On-going    Target Date 05/07/22              Plan - 03/31/22 1326     Clinical Impression Statement Improving low back pain and decreased R LE radiating symptoms based on subjective reports. R hip pain seems to be localized at the joint. Continued working on  improving thoracic extension, trunk and glute strength and posterior pelvic tilt to decrease lumbar extension stress. Pt able to ambulate up to 88 ft today with SPC, CGA. Pt tolerated session well without aggravation of symptoms. Pt will benefit from continued skilled physical therapy services to decrease pain, improve strength and function.    Personal Factors and Comorbidities Comorbidity 3+;Age;Time since onset of injury/illness/exacerbation;Fitness    Comorbidities Arthritis, atrial fibrillation, HTN, idiopathic pulmonary fibrosis    Examination-Activity Limitations Stairs;Bed Mobility;Stand;Sit;Lift;Transfers;Locomotion Level;Carry    Stability/Clinical Decision Making Stable/Uncomplicated    Clinical Decision Making Low    Rehab Potential Fair    PT Frequency 2x / week    PT Duration 8 weeks    PT Treatment/Interventions Therapeutic activities;Therapeutic exercise;Gait training;Balance training;Neuromuscular re-education;Patient/family education;Manual techniques;Dry needling;Electrical Stimulation;Iontophoresis 14m/ml Dexamethasone    PT Next Visit Plan Posture, thoracic extension, trunk and glute strength, lumbopelvic control during gait, manual techniques, modalities PRN    PT Home Exercise Plan Medbridge Access Code: [email or web address]   Consulted and Agree with Plan of Care Patient                         MJoneen BoersPT, DPT  03/31/2022, 2:36 PM

## 2022-04-02 ENCOUNTER — Ambulatory Visit: Payer: Medicare PPO

## 2022-04-02 DIAGNOSIS — Z9181 History of falling: Secondary | ICD-10-CM

## 2022-04-02 DIAGNOSIS — M5417 Radiculopathy, lumbosacral region: Secondary | ICD-10-CM

## 2022-04-02 DIAGNOSIS — M5459 Other low back pain: Secondary | ICD-10-CM

## 2022-04-02 DIAGNOSIS — M6281 Muscle weakness (generalized): Secondary | ICD-10-CM

## 2022-04-02 DIAGNOSIS — M5431 Sciatica, right side: Secondary | ICD-10-CM

## 2022-04-02 DIAGNOSIS — R262 Difficulty in walking, not elsewhere classified: Secondary | ICD-10-CM

## 2022-04-02 NOTE — Therapy (Signed)
OUTPATIENT PHYSICAL THERAPY TREATMENT NOTE And Progress Report (02/16/2022 - 04/02/2022)    Patient Name: Joyce Evans MRN: [id number] DOB:10-Nov-1940, 81 y.o., female Today's Date: 04/02/2022  PCP: Derinda Late, MD  REFERRING PROVIDER: Leonie Green, NP   PT End of Session - 04/02/22 1019     Visit Number 20    Number of Visits 33    Date for PT Re-Evaluation 05/07/22    Authorization Type [employer]    Progress Note Due on Visit 10    PT Start Time 1019    PT Stop Time 1102    PT Time Calculation (min) 43 min    Equipment Utilized During Treatment Gait belt    Activity Tolerance Patient tolerated treatment well;No increased pain    Behavior During Therapy [facility] for tasks assessed/performed                           Past Medical History:  Diagnosis Date   Arthritis    osteo - shoulders, fingers   Atrial fibrillation ([place])    Dizziness    thinks because of diuretic   Family history of adverse reaction to anesthesia    daughter -PONV   Gallstones 2016, 2017   GERD (gastroesophageal reflux disease)    Glaucoma    Heart murmur    history of   Hypertension    Melanoma ([place]) 04/20/2016   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 Excised: 05/19/2016, margins free.   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   Tricuspid valve disorder    leak   Past Surgical History:  Procedure Laterality Date   ABDOMINAL HYSTERECTOMY  1987   partial   CARDIAC CATHETERIZATION  2010   [facility]   CATARACT EXTRACTION W/PHACO Right 08/28/2015   Procedure: CATARACT EXTRACTION PHACO AND INTRAOCULAR LENS PLACEMENT ([place]);  Surgeon: Leandrew Koyanagi, MD;  Location: [place];  Service: Ophthalmology;  Laterality: Right;  TORIC   INTRAMEDULLARY (IM) NAIL INTERTROCHANTERIC Right 10/16/2021   Procedure: INTRAMEDULLARY  (IM) NAIL INTERTROCHANTRIC;  Surgeon: Renee Harder, MD;  Location: [facility];  Service: Orthopedics;  Laterality: Right;   MELANOMA EXCISION Left 05/19/2016   MIS. Left mid. lat. tricep. 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 S/P IM nail placement for right intertrochanteric hip fracture with Dr. Sharlet Salina on 10/16/2021. Pt slipped on the kithen floor. Pt was getting something from the microwave and pt tripped on her O2 tube. Pt was independent with ambulation prior to her fall. Pt fell before in the woods and pt tripped over a root 3 years ago (no injuries per pt). Pt has been using supplemental O2 for the past 2 years in November. Pt has interstitial lung disease (idiopathic pulmonary fibrosis).  Recent breathing test is stable. Back pain began when pt was in Rehab in [place] [facility]. Feels like it is sciatical. Pt radiates along the sciatic nerve and R L 5 dermatome. Pt is healing fine as far as her R hip surgery is concerned. Also has R medial knee pain due to arthritis. Had home health PT which involves seated knee extension, hip flexion, standing hip abduction, extension. Pt did some walking wiht a rw. Feels a lot of pain all over. Walking too far is limited due to her O2 situation.  PRECAUTIONS: SpO2 not under 90 %  SUBJECTIVE: Pt walks from her back door to the car with her rw, about 40 ft. No back pain currently. Bothered her yesterday some around the bra line. R foot did great  yesterday. R low back has not been bothering her. Her thoracic back pain has.      PAIN:  Are you having pain? 0/10 back pain currently. 0/10 R knee soreness. No R heel pain.      TODAY'S TREATMENT:    Gait training  SpO2 99 %, HR 51, 4 L O2    Gait with rollator 110 ft, 87% SpO2, increased to 99% after rest   Then 127 ft, 89% SpO2, increased to 98 % after rest   Then 140 ft, 89% SpO2, increased to 98% after rest    Per pt inquiry on when to transition to a rollator    Pt informed she can use her rollator walker, starting at short distances and monitor O2   Improved  technique, movement at target joints, use of target muscles after min verbal, visual, tactile cues.      Therapeutic exercise    Standing B shoulder extension with B scapular retraction yellow band 10x5 seconds for 3 sets    Seated chin tucks with B scapular retraction 10x3 with 5 second holds     98% SpO2 4 L after session.     Pt changes O2 L based on need Improved exercise technique, movement at target joints, use of target muscles after min to mod verbal, visual, tactile cues.      Response to treatment Pt tolerated session well without aggravation of symptoms.      Clinical impression  Pt demonstrates overall decreased low back and R LE pain as well as improved function (improved FOTO score) since inintial evaluation. Pt also currently able to ambulate at least 88 ft with SPC and up to 140 ft with rollator walker safely, no LOB. Pt making progress with PT towards goals. Pt still demonstrates thoracic back pain, weakness, decreased endurance, and difficulty ambulating longer distances and will benefit from continued skilled physical therapy services to address the aforementioned deficits.  Challenges to progress include age and idiopathic pulmonary fibrosis.      PATIENT EDUCATION: Education details: ther-ex, HEP Person educated: Patient Education method: Explanation,  Demonstration, Tactile cues, Verbal cues, and Handouts Education comprehension: verbalized understanding and returned demonstration   HOME EXERCISE PROGRAM: Access Code: [email or web address] URL: [email or web address] Date: 01/20/2022 Prepared by: Joneen Boers  Exercises - Seated Transversus Abdominis Bracing  - 1 x daily - 7 x weekly - 3 sets - 10 reps - 5 seconds hold - Seated Gluteal Sets  - 1 x daily - 7 x weekly - 3 sets - 10 reps - 5 seconds hold - Discontinued 03/12/2022: Seated Scapular Retraction  - 1 x daily - 7 x weekly - 3 sets - 10 reps - 5 seconds  hold  Can  perform aforementioned exercises in standing now.    - Seated Hip Adduction Isometrics with Ball  - 1 x daily - 7 x weekly - 2 sets - 10 reps - 5 seconds hold - Standing Hip Abduction with Counter Support  - 1 x daily - 7 x weekly - 3 sets - 10 reps - Discontinued on 03/09/2022: Seated Hip Abduction with Resistance  - 1 x daily - 7 x weekly - 2 sets - 10 reps  - Supine Posterior Pelvic Tilt  - 3 x daily - 7 x weekly - 3 sets - 10 reps - 5 seconds hold - Hooklying Clamshell with Resistance  - 1 x daily - 7 x weekly - 3 sets - 10 reps - Standing Posterior Pelvic Tilt  - 3 x daily - 7 x weekly - 3 sets - 10 reps - 5 seconds hold   PT Short Term Goals - 02/16/22 0941       PT SHORT TERM GOAL #1   Title Pt will be independent with her initial HEP to decrease pain, improve strength, function, and ability to stand and ambulate more comfortably.    Baseline No questions with HEP, has been doing them (02/16/2022)    Time 3    Period Weeks    Status Achieved    Target Date 02/05/22              PT Long Term Goals - 04/02/22 1024       PT LONG TERM GOAL #1   Title Pt will have a decrease in low back pain to 3/10 or less at worst to promote ability to stand and ambulate with less difficulty.    Baseline 6/10 low back pain at worst for the past 2 months (01/12/2022); 7/10 at most for the past 7 days (standing in the  kitchen, more standing than normal becuase pt had guests over from [facility]) ( 02/16/2022);  6-7/10 back pain at worst for the past 7 days. Back pain at worst goes away faster since starting PT. (03/09/2022). R low back pain has not been bothering her. Thoracic back pain is about a 6/10 at worst for the past 7 days (04/02/2022)    Time 8    Period Weeks    Status On-going    Target Date 05/07/22      PT LONG TERM GOAL #2   Title Pt will have a decrease in R LE pain to 3/10 or less at worst to promote ability to stand and ambulate will less difficulty.    Baseline 9/10 R LE pain at worst (01/12/2022); 3/10 R sciatic and L5 dermatome pain at most for the past 7 days; has R > L heel pain however which wakes her up at night. (02/16/2022); 4/10 R LE pain at most for the past 7 days. R heel pain however wakes her up at night. (03/12/2022), 3/10 R LE pain at worst for the past 7 days (04/02/2022)    Time 8    Period Weeks    Status Achieved    Target Date 05/07/22      PT LONG TERM GOAL #3   Title Pt will improve her FOTO score by at least 10 points as a demontsration of improved function.    Baseline Lumbar Spine FOTO 39 (01/12/2022); 46 (02/16/2022); 52 (03/12/2022)    Time 8    Period Weeks    Status Achieved    Target Date 05/07/22      PT LONG  TERM GOAL #4   Title Pt will be able to ambulate at least 100 ft independently to promote mobility and return to prior level of function.    Baseline Able to ambulate with rw short distance, about 40 ft with reproduction of pain. (01/12/2022); able to ambulate 120 ft with rw, SBA (02/16/2022); SPC 40 ft, CGA (03/12/2022); Pt able to ambulate 88 ft wiht SPC on L side (03/31/2022)    Time 8    Period Weeks    Status On-going    Target Date 05/07/22              Plan - 04/02/22 1019     Clinical Impression Statement Pt demonstrates overall decreased low back and R LE pain as well as improved function (improved FOTO score) since inintial evaluation. Pt also  currently able to ambulate at least 88 ft with SPC and up to 140 ft with rollator walker safely, no LOB. Pt making progress with PT towards goals. Pt still demonstrates thoracic back pain, weakness, decreased endurance, and difficulty ambulating longer distances and will benefit from continued skilled physical therapy services to address the aforementioned deficits.  Challenges to progress include age and idiopathic pulmonary fibrosis.    Personal Factors and Comorbidities Comorbidity 3+;Age;Time since onset of injury/illness/exacerbation;Fitness    Comorbidities Arthritis, atrial fibrillation, HTN, idiopathic pulmonary fibrosis    Examination-Activity Limitations Stairs;Bed Mobility;Stand;Sit;Lift;Transfers;Locomotion Level;Carry    Stability/Clinical Decision Making Stable/Uncomplicated    Clinical Decision Making Low    Rehab Potential Fair    PT Frequency 2x / week    PT Duration 8 weeks    PT Treatment/Interventions Therapeutic activities;Therapeutic exercise;Gait training;Balance training;Neuromuscular re-education;Patient/family education;Manual techniques;Dry needling;Electrical Stimulation;Iontophoresis '4mg'$ /ml Dexamethasone    PT Next Visit Plan Posture, thoracic extension, trunk and glute strength, lumbopelvic control during gait, manual techniques, modalities PRN    PT Home Exercise Plan Medbridge Access Code: [email or web address]    Consulted and Agree with Plan of Care Patient                          Joneen Boers PT, DPT  04/02/2022, 1:35 PM

## 2022-04-07 ENCOUNTER — Ambulatory Visit: Payer: Medicare PPO

## 2022-04-07 DIAGNOSIS — M5431 Sciatica, right side: Secondary | ICD-10-CM

## 2022-04-07 DIAGNOSIS — M6281 Muscle weakness (generalized): Secondary | ICD-10-CM

## 2022-04-07 DIAGNOSIS — R262 Difficulty in walking, not elsewhere classified: Secondary | ICD-10-CM

## 2022-04-07 DIAGNOSIS — Z9181 History of falling: Secondary | ICD-10-CM

## 2022-04-07 DIAGNOSIS — M5417 Radiculopathy, lumbosacral region: Secondary | ICD-10-CM

## 2022-04-07 DIAGNOSIS — M5459 Other low back pain: Secondary | ICD-10-CM

## 2022-04-07 NOTE — Therapy (Signed)
OUTPATIENT PHYSICAL THERAPY TREATMENT NOTE      Patient Name: Joyce Evans MRN: [id number] DOB:03/22/1941, 81 y.o., female Today's Date: 04/07/2022  PCP: Derinda Late, MD  REFERRING PROVIDER: Leonie Green, NP   PT End of Session - 04/07/22 1332     Visit Number 21    Number of Visits 33    Date for PT Re-Evaluation 05/07/22    Authorization Type [employer]    Progress Note Due on Visit 10    PT Start Time 1332    PT Stop Time 1413    PT Time Calculation (min) 41 min    Equipment Utilized During Treatment Gait belt    Activity Tolerance Patient tolerated treatment well;No increased pain    Behavior During Therapy [facility] for tasks assessed/performed                            Past Medical History:  Diagnosis Date   Arthritis    osteo - shoulders, fingers   Atrial fibrillation ([place])    Dizziness    thinks because of diuretic   Family history of adverse reaction to anesthesia    daughter -PONV   Gallstones 2016, 2017   GERD (gastroesophageal reflux disease)    Glaucoma    Heart murmur    history of   Hypertension    Melanoma ([place]) 04/20/2016   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 Excised: 05/19/2016, margins free.   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   Tricuspid valve disorder    leak   Past Surgical History:  Procedure Laterality Date   ABDOMINAL HYSTERECTOMY  1987   partial   CARDIAC CATHETERIZATION  2010   [facility]   CATARACT EXTRACTION W/PHACO Right 08/28/2015   Procedure: CATARACT EXTRACTION PHACO AND INTRAOCULAR LENS PLACEMENT ([place]);  Surgeon: Leandrew Koyanagi, MD;  Location: [place];  Service: Ophthalmology;  Laterality: Right;  TORIC   INTRAMEDULLARY (IM) NAIL INTERTROCHANTERIC Right 10/16/2021   Procedure: INTRAMEDULLARY (IM) NAIL INTERTROCHANTRIC;  Surgeon:  Renee Harder, MD;  Location: [facility];  Service: Orthopedics;  Laterality: Right;   MELANOMA EXCISION Left 05/19/2016   MIS. Left mid. lat. tricep. 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 S/P IM nail placement for right intertrochanteric hip fracture with Dr. Sharlet Salina on 10/16/2021. Pt slipped on the kithen floor. Pt was getting something from the microwave and pt tripped on her O2 tube. Pt was independent with ambulation prior to her fall. Pt fell before in the woods and pt tripped over a root 3 years ago (no injuries per pt). Pt has been using supplemental O2 for the past 2 years in November. Pt has interstitial lung disease (idiopathic pulmonary fibrosis). Recent breathing test  is stable. Back pain began when pt was in Rehab in [place] [facility]. Feels like it is sciatical. Pt radiates along the sciatic nerve and R L 5 dermatome. Pt is healing fine as far as her R hip surgery is concerned. Also has R medial knee pain due to arthritis. Had home health PT which involves seated knee extension, hip flexion, standing hip abduction, extension. Pt did some walking wiht a rw. Feels a lot of pain all over. Walking too far is limited due to her O2 situation.  PRECAUTIONS: SpO2 not under 90 %  SUBJECTIVE: No back/mid back pain currently.     PAIN:  Are you having pain? 0/10 back pain currently. 0/10 R knee soreness. No R heel pain.      TODAY'S TREATMENT:    04/07/2022     Therapeutic exercise    SpO2 96%, HR 48, 3 L O2   Seated chin tucks with B scapular retraction 10x2 with 5 second holds   Standing B shoulder extension with B scapular retraction yellow band 10x5 seconds for 3 sets   Increased to 4 L O2  Standing bent over hip extension  R 10x. 91 % SpO2, increased to 97 % after rest   Breathing in through nose and out with mouth (to "blow candles out")  Pt states taking a deep breath makes her cough  Standing hip extension with B UE assist  L 10x2. SpO2 decreased to 86%, improved to 93%+ after rest    Increased therapeutic rest breaks to allow increase in oxygen  SpO2 96% 4 L  Standing hip extension with B UE assist  L 10x (last set). SpO2 decreased to about 91%, improved to 97%+ after rest   Pt was recommended to still bring her wc secondary to decreased O2 saturation with increased time returning to above 90%. Pt verbalized understanding.     Improved exercise technique, movement at target joints, use of target muscles after mod verbal, visual, tactile cues.      Response to treatment Pt tolerated session well without aggravation of pain.      Clinical impression  Continued working on improving thoracic extension and improving trunk and hip extension strength to decrease stress to low back. Increased therapeutic rest breaks today secondary to increased time returning oxygen saturation to 90%+ after exercises. Pt tolerated session well without aggravation of pain. Pt will benefit from continued skilled physical therapy services to decrease pain, improve strength and function.        PATIENT EDUCATION: Education details: ther-ex, HEP Person educated: Patient Education method: Explanation, Demonstration, Tactile cues, Verbal cues, and Handouts Education comprehension: verbalized understanding and returned demonstration   HOME EXERCISE PROGRAM: Access Code: [email or web address] URL:  [email or web address] Date: 01/20/2022 Prepared by: Joneen Boers  Exercises - Seated Transversus Abdominis Bracing  - 1 x daily - 7 x weekly - 3 sets - 10 reps - 5 seconds hold - Seated Gluteal Sets  - 1 x daily - 7 x weekly - 3 sets - 10 reps - 5 seconds hold - Discontinued 03/12/2022: Seated Scapular Retraction  - 1 x daily - 7 x weekly - 3 sets - 10 reps - 5 seconds  hold  Can perform aforementioned exercises in standing now.    - Seated Hip Adduction Isometrics with Ball  - 1 x daily - 7 x weekly - 2 sets - 10 reps - 5 seconds hold - Standing Hip Abduction with Counter Support  - 1 x daily - 7 x weekly -  3 sets - 10 reps - Discontinued on 03/09/2022: Seated Hip Abduction with Resistance  - 1 x daily - 7 x weekly - 2 sets - 10 reps  - Supine Posterior Pelvic Tilt  - 3 x daily - 7 x weekly - 3 sets - 10 reps - 5 seconds hold - Hooklying Clamshell with Resistance  - 1 x daily - 7 x weekly - 3 sets - 10 reps - Standing Posterior Pelvic Tilt  - 3 x daily - 7 x weekly - 3 sets - 10 reps - 5 seconds hold   PT Short Term Goals - 02/16/22 0941       PT SHORT TERM GOAL #1   Title Pt will be independent with her initial HEP to decrease pain, improve strength, function, and ability to stand and ambulate more comfortably.    Baseline No questions with HEP, has been doing them (02/16/2022)    Time 3    Period Weeks    Status Achieved    Target Date 02/05/22              PT Long Term Goals - 04/02/22 1024       PT LONG TERM GOAL #1   Title Pt will have a decrease in low back pain to 3/10 or less at worst to promote ability to stand and ambulate with less difficulty.    Baseline 6/10 low back pain at worst for the past 2 months (01/12/2022); 7/10 at most for the past 7 days (standing in the kitchen, more standing than normal becuase pt had guests over from [facility]) ( 02/16/2022);  6-7/10 back pain at worst for the past 7 days. Back pain at worst goes away faster since  starting PT. (03/09/2022). R low back pain has not been bothering her. Thoracic back pain is about a 6/10 at worst for the past 7 days (04/02/2022)    Time 8    Period Weeks    Status On-going    Target Date 05/07/22      PT LONG TERM GOAL #2   Title Pt will have a decrease in R LE pain to 3/10 or less at worst to promote ability to stand and ambulate will less difficulty.    Baseline 9/10 R LE pain at worst (01/12/2022); 3/10 R sciatic and L5 dermatome pain at most for the past 7 days; has R > L heel pain however which wakes her up at night. (02/16/2022); 4/10 R LE pain at most for the past 7 days. R heel pain however wakes her up at night. (03/12/2022), 3/10 R LE pain at worst for the past 7 days (04/02/2022)    Time 8    Period Weeks    Status Achieved    Target Date 05/07/22      PT LONG TERM GOAL #3   Title Pt will improve her FOTO score by at least 10 points as a demontsration of improved function.    Baseline Lumbar Spine FOTO 39 (01/12/2022); 46 (02/16/2022); 52 (03/12/2022)    Time 8    Period Weeks    Status Achieved    Target Date 05/07/22      PT LONG TERM GOAL #4   Title Pt will be able to ambulate at least 100 ft independently to promote mobility and return to prior level of function.    Baseline Able to ambulate with rw short distance, about 40 ft with reproduction of pain. (01/12/2022); able to ambulate 120 ft with  rw, SBA (02/16/2022); SPC 40 ft, CGA (03/12/2022); Pt able to ambulate 88 ft wiht SPC on L side (03/31/2022)    Time 8    Period Weeks    Status On-going    Target Date 05/07/22              Plan - 04/07/22 1326     Clinical Impression Statement Continued working on improving thoracic extension and improving trunk and hip extension strength to decrease stress to low back. Increased therapeutic rest breaks today secondary to increased time returning oxygen saturation to 90%+ after exercises. Pt tolerated session well without aggravation of pain. Pt will benefit from  continued skilled physical therapy services to decrease pain, improve strength and function.    Personal Factors and Comorbidities Comorbidity 3+;Age;Time since onset of injury/illness/exacerbation;Fitness    Comorbidities Arthritis, atrial fibrillation, HTN, idiopathic pulmonary fibrosis    Examination-Activity Limitations Stairs;Bed Mobility;Stand;Sit;Lift;Transfers;Locomotion Level;Carry    Stability/Clinical Decision Making Stable/Uncomplicated    Rehab Potential Fair    PT Frequency 2x / week    PT Duration 8 weeks    PT Treatment/Interventions Therapeutic activities;Therapeutic exercise;Gait training;Balance training;Neuromuscular re-education;Patient/family education;Manual techniques;Dry needling;Electrical Stimulation;Iontophoresis '4mg'$ /ml Dexamethasone    PT Next Visit Plan Posture, thoracic extension, trunk and glute strength, lumbopelvic control during gait, manual techniques, modalities PRN    PT Home Exercise Plan Medbridge Access Code: [email or web address]    Consulted and Agree with Plan of Care Patient                          Joneen Boers PT, DPT  04/07/2022, 5:51 PM

## 2022-04-09 ENCOUNTER — Ambulatory Visit: Payer: Medicare PPO

## 2022-04-09 ENCOUNTER — Encounter: Payer: Self-pay | Admitting: Dermatology

## 2022-04-09 ENCOUNTER — Ambulatory Visit: Payer: Medicare PPO | Admitting: Dermatology

## 2022-04-09 DIAGNOSIS — M5459 Other low back pain: Secondary | ICD-10-CM | POA: Diagnosis not present

## 2022-04-09 DIAGNOSIS — R262 Difficulty in walking, not elsewhere classified: Secondary | ICD-10-CM

## 2022-04-09 DIAGNOSIS — L578 Other skin changes due to chronic exposure to nonionizing radiation: Secondary | ICD-10-CM

## 2022-04-09 DIAGNOSIS — M5417 Radiculopathy, lumbosacral region: Secondary | ICD-10-CM

## 2022-04-09 DIAGNOSIS — L570 Actinic keratosis: Secondary | ICD-10-CM

## 2022-04-09 DIAGNOSIS — M5431 Sciatica, right side: Secondary | ICD-10-CM

## 2022-04-09 DIAGNOSIS — M6281 Muscle weakness (generalized): Secondary | ICD-10-CM

## 2022-04-09 DIAGNOSIS — Z9181 History of falling: Secondary | ICD-10-CM

## 2022-04-09 NOTE — Patient Instructions (Addendum)
Cryotherapy Aftercare  Wash gently with soap and water everyday.   Apply Vaseline and Band-Aid daily until healed.     If you feel a rough spot on nose after the area heals from the freezing, may use that cream (5 fluorouracil/calcipotriene cream 2 times a day for 7 days to nose.      Due to recent changes in healthcare laws, you may see results of your pathology and/or laboratory studies on MyChart before the doctors have had a chance to review them. We understand that in some cases there may be results that are confusing or concerning to you. Please understand that not all results are received at the same time and often the doctors may need to interpret multiple results in order to provide you with the best plan of care or course of treatment. Therefore, we ask that you please give [place] 2 business days to thoroughly review all your results before contacting the office for clarification. Should we see a critical lab result, you will be contacted sooner.   If You Need Anything After Your Visit  If you have any questions or concerns for your doctor, please call our main line at [phone] and press option 4 to reach your doctor's medical assistant. If no one answers, please leave a voicemail as directed and we will return your call as soon as possible. Messages left after 4 pm will be answered the following business day.   You may also send [place] a message via [place]. We typically respond to MyChart messages within 1-2 business days.  For prescription refills, please ask your pharmacy to contact our office. Our fax number is [phone].  If you have an urgent issue when the clinic is closed that cannot wait until the next business day, you can page your doctor at the number below.    Please note that while we do our best to be available for urgent issues outside of office hours, we are not available 24/7.   If you have an urgent issue and are unable to reach [place], you may choose to seek  medical care at your doctor's office, retail clinic, urgent care center, or emergency room.  If you have a medical emergency, please immediately call 911 or go to the emergency department.  Pager Numbers  - Dr. Nehemiah Massed: [phone]  - Dr. Laurence Ferrari: [phone]  - Dr. Nicole Kindred: [phone]  In the event of inclement weather, please call our main line at [phone] for an update on the status of any delays or closures.  Dermatology Medication Tips: Please keep the boxes that topical medications come in in order to help keep track of the instructions about where and how to use these. Pharmacies typically print the medication instructions only on the boxes and not directly on the medication tubes.   If your medication is too expensive, please contact our office at [phone] option 4 or send [place] a message through [place].   We are unable to tell what your co-pay for medications will be in advance as this is different depending on your insurance coverage. However, we may be able to find a substitute medication at lower cost or fill out paperwork to get insurance to cover a needed medication.   If a prior authorization is required to get your medication covered by your insurance company, please allow [place] 1-2 business days to complete this process.  Drug prices often vary depending on where the prescription is filled and some pharmacies may offer cheaper prices.  The  website www.goodrx.com contains coupons for medications through different pharmacies. The prices here do not account for what the cost may be with help from insurance (it may be cheaper with your insurance), but the website can give you the price if you did not use any insurance.  - You can print the associated coupon and take it with your prescription to the pharmacy.  - You may also stop by our office during regular business hours and pick up a GoodRx coupon card.  - If you need your prescription sent electronically to a  different pharmacy, notify our office through [facility] or by phone at [phone] option 4.     Si Usted Necesita Algo Despus de Su Visita  Tambin puede enviarnos un mensaje a travs de Pharmacist, community. Por lo general respondemos a los mensajes de MyChart en el transcurso de 1 a 2 das hbiles.  Para renovar recetas, por favor pida a su farmacia que se ponga en contacto con nuestra oficina. Harland Dingwall de fax es [place] [phone].  Si tiene un asunto urgente cuando la clnica est cerrada y que no puede esperar hasta el siguiente da hbil, puede llamar/localizar a su doctor(a) al nmero que aparece a continuacin.   Por favor, tenga en cuenta que aunque hacemos todo lo posible para estar disponibles para asuntos urgentes fuera del horario de [place], no estamos disponibles las 24 horas del da, los 7 das de la [place].   Si tiene un problema urgente y no puede comunicarse con nosotros, puede optar por buscar atencin mdica  en el consultorio de su doctor(a), en una clnica privada, en un centro de atencin urgente o en una sala de emergencias.  Si tiene Engineering geologist, por favor llame inmediatamente al 911 o vaya a la sala de emergencias.  Nmeros de bper  - Dr. Nehemiah Massed: [phone]  - Dra. Moye: [phone]  - Dra. Nicole Kindred: [phone]  En caso de inclemencias del [place], por favor llame a Johnsie Kindred principal al [phone] para una actualizacin sobre el [place] de cualquier retraso o cierre.  Consejos para la medicacin en dermatologa: Por favor, guarde las cajas en las que vienen los medicamentos de uso tpico para ayudarle a seguir las instrucciones sobre dnde y cmo usarlos. Las farmacias generalmente imprimen las instrucciones del medicamento slo en las cajas y no directamente en los tubos del [place].   Si su medicamento es muy caro, por favor, pngase en contacto con Zigmund Daniel llamando al [phone] y presione la opcin 4 o envenos un  mensaje a travs de Pharmacist, community.   No podemos decirle cul ser su copago por los medicamentos por adelantado ya que esto es diferente dependiendo de la cobertura de su seguro. Sin embargo, es posible que podamos encontrar un medicamento sustituto a Electrical engineer un formulario para que el seguro cubra el medicamento que se considera necesario.   Si se requiere una autorizacin previa para que su compaa de seguros [place] su medicamento, por favor permtanos de 1 a 2 das hbiles para completar este proceso.  Los precios de los medicamentos varan con frecuencia dependiendo del Environmental consultant de dnde se surte la receta y alguna farmacias pueden ofrecer precios ms baratos.  El sitio web www.goodrx.com tiene cupones para medicamentos de Airline pilot. Los precios aqu no tienen en cuenta lo que podra costar con la ayuda del seguro (puede ser ms barato con su seguro), pero el sitio web puede darle el precio si no utiliz Research scientist (physical sciences).  - Puede  imprimir el cupn correspondiente y llevarlo con su receta a la farmacia.  - Tambin puede pasar por nuestra oficina durante el horario de atencin regular y Charity fundraiser una tarjeta de cupones de GoodRx.  - Si necesita que su receta se enve electrnicamente a una farmacia diferente, informe a nuestra oficina a travs de MyChart de [place] o por telfono llamando al [phone] y presione la opcin 4.

## 2022-04-09 NOTE — Therapy (Signed)
OUTPATIENT PHYSICAL THERAPY TREATMENT NOTE      Patient Name: Joyce Evans MRN: [id number] DOB:[birth year], 81 y.o., female Today's Date: 04/09/2022  PCP: Derinda Late, MD  REFERRING PROVIDER: Leonie Green, NP   PT End of Session - 04/09/22 1415     Visit Number 22    Number of Visits 33    Date for PT Re-Evaluation 05/07/22    Authorization Type [employer] Medicare    Progress Note Due on Visit 10    PT Start Time 1415    PT Stop Time 1438    PT Time Calculation (min) 23 min    Equipment Utilized During Treatment Gait belt    Activity Tolerance Patient tolerated treatment well;No increased pain    Behavior During Therapy WFL for tasks assessed/performed                             Past Medical History:  Diagnosis Date   Actinic keratosis    Arthritis    osteo - shoulders, fingers   Atrial fibrillation (HCC)    Dizziness    thinks because of diuretic   Family history of adverse reaction to anesthesia    daughter -PONV   Gallstones 2016, 2017   GERD (gastroesophageal reflux disease)    Glaucoma    Heart murmur    history of   Hypertension    Melanoma ([place]) 04/20/2016   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 Excised: 05/19/2016, margins free.   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   Tricuspid valve disorder    leak   Past Surgical History:  Procedure Laterality Date   ABDOMINAL HYSTERECTOMY  1987   partial   CARDIAC CATHETERIZATION  2010   [facility]   CATARACT EXTRACTION W/PHACO Right 08/28/2015   Procedure: CATARACT EXTRACTION PHACO AND INTRAOCULAR LENS PLACEMENT ([place]);  Surgeon: Leandrew Koyanagi, MD;  Location: [facility];  Service: Ophthalmology;  Laterality: Right;  TORIC   INTRAMEDULLARY (IM) NAIL INTERTROCHANTERIC Right 10/16/2021   Procedure: INTRAMEDULLARY (IM) NAIL  INTERTROCHANTRIC;  Surgeon: Renee Harder, MD;  Location: [facility];  Service: Orthopedics;  Laterality: Right;   MELANOMA EXCISION Left 05/19/2016   MIS. Left mid. lat. tricep. 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 S/P IM nail placement for right intertrochanteric hip fracture with Dr. Sharlet Salina on 10/16/2021. Pt slipped on the kithen floor. Pt was getting something from the microwave and pt tripped on her O2 tube. Pt was independent with ambulation prior to her fall. Pt fell before in the woods and pt tripped over a root 3 years ago (no injuries per pt). Pt has been using supplemental O2 for the past 2 years in November. Pt has interstitial lung disease (  idiopathic pulmonary fibrosis). Recent breathing test is stable. Back pain began when pt was in Rehab in [place] [facility]. Feels like it is sciatical. Pt radiates along the sciatic nerve and R L 5 dermatome. Pt is healing fine as far as her R hip surgery is concerned. Also has R medial knee pain due to arthritis. Had home health PT which involves seated knee extension, hip flexion, standing hip abduction, extension. Pt did some walking wiht a rw. Feels a lot of pain all over. Walking too far is limited due to her O2 situation.  PRECAUTIONS: SpO2 not under 90 %  SUBJECTIVE: NBack is doing fine today and at home. Has not been bothering her. L shoulder and R knee has been bothering her; does not bother her now. R heel pain bothered her  last night.     PAIN:  Are you having pain? 0/10 back pain currently. 0/10 R knee soreness. No R heel pain.      TODAY'S TREATMENT:   04/07/2022     Therapeutic exercise    SpO2 96%, HR 48, 4 L O2 Then 3 L O2, 98% to 100%, HR 45-51   Seated B scapular retraction 10x5 seconds for 2 sets   HR averaged about 60 BPM but then decreased to 32 BPM, then back up to 56 BPM  Session stopped. Pt was recommended to call her doctor pertaining to her heart rate. Go to the ER if pt needs to. Pt and husband verbalized understanding.     Improved exercise technique, movement at target joints, use of target muscles after mod verbal, visual, tactile cues.      Response to treatment Pt tolerated session well without aggravation of pain.      Clinical impression Unstable HR ranging from about 32 bpm to 60 bpm. Session ended. Pt was recommended to call her doctor pertaining to her heart rate. Go to the ER if pt needs to. Pt and husband verbalized understanding.        PATIENT EDUCATION: Education details: ther-ex, HEP Person educated: Patient Education method: Explanation, Demonstration, Tactile cues, Verbal cues, and Handouts Education comprehension: verbalized understanding and returned demonstration   HOME EXERCISE PROGRAM: Access Code: [email or web address] URL: [email or web address] Date: 01/20/2022 Prepared by: Joneen Boers  Exercises - Seated Transversus Abdominis Bracing  - 1 x daily - 7 x weekly - 3 sets - 10 reps - 5 seconds hold - Seated Gluteal Sets  - 1 x daily - 7 x weekly - 3 sets - 10 reps - 5 seconds hold - Discontinued 03/12/2022: Seated Scapular Retraction  - 1 x daily - 7 x weekly - 3 sets - 10 reps - 5 seconds  hold  Can perform aforementioned exercises in standing now.    - Seated Hip Adduction Isometrics with Ball  - 1 x daily - 7 x weekly - 2 sets - 10 reps - 5 seconds hold - Standing Hip Abduction with Counter Support  - 1 x daily - 7 x weekly  - 3 sets - 10 reps - Discontinued on 03/09/2022: Seated Hip Abduction with Resistance  - 1 x daily - 7 x weekly - 2 sets - 10 reps  - Supine Posterior Pelvic Tilt  - 3 x daily - 7 x weekly - 3 sets - 10 reps - 5 seconds hold - Hooklying Clamshell with Resistance  - 1 x daily - 7 x weekly - 3 sets - 10 reps - Standing Posterior Pelvic Tilt  - 3 x daily -  7 x weekly - 3 sets - 10 reps - 5 seconds hold   PT Short Term Goals - 02/16/22 0941       PT SHORT TERM GOAL #1   Title Pt will be independent with her initial HEP to decrease pain, improve strength, function, and ability to stand and ambulate more comfortably.    Baseline No questions with HEP, has been doing them (02/16/2022)    Time 3    Period Weeks    Status Achieved    Target Date 02/05/22              PT Long Term Goals - 04/02/22 1024       PT LONG TERM GOAL #1   Title Pt will have a decrease in low back pain to 3/10 or less at worst to promote ability to stand and ambulate with less difficulty.    Baseline 6/10 low back pain at worst for the past 2 months (01/12/2022); 7/10 at most for the past 7 days (standing in the kitchen, more standing than normal becuase pt had guests over from [facility]) ( 02/16/2022);  6-7/10 back pain at worst for the past 7 days. Back pain at worst goes away faster since starting PT. (03/09/2022). R low back pain has not been bothering her. Thoracic back pain is about a 6/10 at worst for the past 7 days (04/02/2022)    Time 8    Period Weeks    Status On-going    Target Date 05/07/22      PT LONG TERM GOAL #2   Title Pt will have a decrease in R LE pain to 3/10 or less at worst to promote ability to stand and ambulate will less difficulty.    Baseline 9/10 R LE pain at worst (01/12/2022); 3/10 R sciatic and L5 dermatome pain at most for the past 7 days; has R > L heel pain however which wakes her up at night. (02/16/2022); 4/10 R LE pain at most for the past 7 days. R heel pain however wakes her up at  night. (03/12/2022), 3/10 R LE pain at worst for the past 7 days (04/02/2022)    Time 8    Period Weeks    Status Achieved    Target Date 05/07/22      PT LONG TERM GOAL #3   Title Pt will improve her FOTO score by at least 10 points as a demontsration of improved function.    Baseline Lumbar Spine FOTO 39 (01/12/2022); 46 (02/16/2022); 52 (03/12/2022)    Time 8    Period Weeks    Status Achieved    Target Date 05/07/22      PT LONG TERM GOAL #4   Title Pt will be able to ambulate at least 100 ft independently to promote mobility and return to prior level of function.    Baseline Able to ambulate with rw short distance, about 40 ft with reproduction of pain. (01/12/2022); able to ambulate 120 ft with rw, SBA (02/16/2022); SPC 40 ft, CGA (03/12/2022); Pt able to ambulate 88 ft wiht SPC on L side (03/31/2022)    Time 8    Period Weeks    Status On-going    Target Date 05/07/22              Plan - 04/09/22 1414     Clinical Impression Statement Unstable HR ranging from about 32 bpm to 60 bpm. Session ended. Pt was recommended to call her doctor pertaining  to her heart rate. Go to the ER if pt needs to. Pt and husband verbalized understanding.    Personal Factors and Comorbidities Comorbidity 3+;Age;Time since onset of injury/illness/exacerbation;Fitness    Comorbidities Arthritis, atrial fibrillation, HTN, idiopathic pulmonary fibrosis    Examination-Activity Limitations Stairs;Bed Mobility;Stand;Sit;Lift;Transfers;Locomotion Level;Carry    Stability/Clinical Decision Making Evolving/Moderate complexity    Clinical Decision Making Moderate    Rehab Potential Fair    PT Frequency 2x / week    PT Duration 8 weeks    PT Treatment/Interventions Therapeutic activities;Therapeutic exercise;Gait training;Balance training;Neuromuscular re-education;Patient/family education;Manual techniques;Dry needling;Electrical Stimulation;Iontophoresis '4mg'$ /ml Dexamethasone    PT Next Visit Plan Posture, thoracic  extension, trunk and glute strength, lumbopelvic control during gait, manual techniques, modalities PRN    PT Home Exercise Plan Medbridge Access Code: [email or web address]    Consulted and Agree with Plan of Care Patient                           Joneen Boers PT, DPT  04/09/2022, 2:47 PM

## 2022-04-09 NOTE — Progress Notes (Signed)
   Follow-Up Visit   Subjective  Joyce Evans is a 81 y.o. female who presents for the following: Actinic Keratosis (Nose, 5mf/u, used 5FU/Calcipotriene cr bid x 7 days in July). The patient has spots, moles and lesions to be evaluated, some may be new or changing and the patient has concerns that these could be cancer.  The following portions of the chart were reviewed this encounter and updated as appropriate:   Tobacco  Allergies  Meds  Problems  Med Hx  Surg Hx  Fam Hx     Review of Systems:  No other skin or systemic complaints except as noted in HPI or Assessment and Plan.  Objective  Well appearing patient in no apparent distress; mood and affect are within normal limits.  A focused examination was performed including face. Relevant physical exam findings are noted in the Assessment and Plan.  nose x 1 Pink scaly macules   Assessment & Plan   Actinic Damage - Severe, confluent actinic changes with pre-cancerous actinic keratoses  - Severe, chronic, not at goal, secondary to cumulative UV radiation exposure over time - diffuse scaly erythematous macules and papules with underlying dyspigmentation - Discussed Prescription "Field Treatment" for Severe, Chronic Confluent Actinic Changes with Pre-Cancerous Actinic Keratoses Field treatment involves treatment of an entire area of skin that has confluent Actinic Changes (Sun/ Ultraviolet light damage) and PreCancerous Actinic Keratoses by method of PhotoDynamic Therapy (PDT) and/or prescription Topical Chemotherapy agents such as 5-fluorouracil, 5-fluorouracil/calcipotriene, and/or imiquimod.  The purpose is to decrease the number of clinically evident and subclinical PreCancerous lesions to prevent progression to development of skin cancer by chemically destroying early precancer changes that may or may not be visible.  It has been shown to reduce the risk of developing skin cancer in the treated area. As a result of treatment,  redness, scaling, crusting, and open sores may occur during treatment course. One or more than one of these methods may be used and may have to be used several times to control, suppress and eliminate the PreCancerous changes. Discussed treatment course, expected reaction, and possible side effects. - Recommend daily broad spectrum sunscreen SPF 30+ to sun-exposed areas, reapply every 2 hours as needed.  - Staying in the shade or wearing long sleeves, sun glasses (UVA+UVB protection) and wide brim hats (4-inch brim around the entire circumference of the hat) are also recommended. - Call for new or changing lesions.   AK (actinic keratosis) nose x 1  Good results with 5FU/Calcipotriene cream  If pt feels a rough area on nose may use 5FU/Calcipotriene cream bid for 7 days  Destruction of lesion - nose x 1 Complexity: simple   Destruction method: cryotherapy   Informed consent: discussed and consent obtained   Timeout:  patient name, date of birth, surgical site, and procedure verified Lesion destroyed using liquid nitrogen: Yes   Region frozen until ice ball extended beyond lesion: Yes   Outcome: patient tolerated procedure well with no complications   Post-procedure details: wound care instructions given    Related Medications fluorouracil (EFUDEX) 5 % cream Apply topically 2 (two) times daily. On August 15 start cream to nose bid for 7 days   Return in about 1 year (around 04/10/2023) for AK f/u.  I, SOthelia Pulling RMA, am acting as scribe for DSarina Ser MD . Documentation: I have reviewed the above documentation for accuracy and completeness, and I agree with the above.  DSarina Ser MD

## 2022-04-14 ENCOUNTER — Ambulatory Visit: Payer: Medicare PPO

## 2022-04-16 ENCOUNTER — Ambulatory Visit: Payer: Medicare PPO

## 2022-04-21 ENCOUNTER — Ambulatory Visit: Payer: Medicare PPO | Attending: Orthopedic Surgery

## 2022-04-21 DIAGNOSIS — M6281 Muscle weakness (generalized): Secondary | ICD-10-CM

## 2022-04-21 DIAGNOSIS — M5459 Other low back pain: Secondary | ICD-10-CM

## 2022-04-21 DIAGNOSIS — M5431 Sciatica, right side: Secondary | ICD-10-CM | POA: Diagnosis present

## 2022-04-21 DIAGNOSIS — M5417 Radiculopathy, lumbosacral region: Secondary | ICD-10-CM | POA: Diagnosis present

## 2022-04-21 DIAGNOSIS — Z9181 History of falling: Secondary | ICD-10-CM | POA: Diagnosis present

## 2022-04-21 DIAGNOSIS — R262 Difficulty in walking, not elsewhere classified: Secondary | ICD-10-CM | POA: Diagnosis present

## 2022-04-21 NOTE — Therapy (Signed)
OUTPATIENT PHYSICAL THERAPY TREATMENT NOTE      Patient Name: Joyce Evans MRN: [id number] DOB:[birth year], 81 y.o., female Today's Date: 04/21/2022  PCP: Derinda Late, MD  REFERRING PROVIDER: Leonie Green, NP   PT End of Session - 04/21/22 1543     Visit Number 23    Number of Visits 33    Date for PT Re-Evaluation 05/07/22    Authorization Type [employer]    Progress Note Due on Visit 10    PT Start Time 1544    PT Stop Time 1628    PT Time Calculation (min) 44 min    Equipment Utilized During Treatment Gait belt    Activity Tolerance Patient tolerated treatment well;No increased pain    Behavior During Therapy WFL for tasks assessed/performed                              Past Medical History:  Diagnosis Date   Actinic keratosis    Arthritis    osteo - shoulders, fingers   Atrial fibrillation (HCC)    Dizziness    thinks because of diuretic   Family history of adverse reaction to anesthesia    daughter -PONV   Gallstones 2016, 2017   GERD (gastroesophageal reflux disease)    Glaucoma    Heart murmur    history of   Hypertension    Melanoma ([place]) 04/20/2016   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 Excised: 05/19/2016, margins free.   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   Tricuspid valve disorder    leak   Past Surgical History:  Procedure Laterality Date   ABDOMINAL HYSTERECTOMY  1987   partial   CARDIAC CATHETERIZATION  2010   [facility]   CATARACT EXTRACTION W/PHACO Right 08/28/2015   Procedure: CATARACT EXTRACTION PHACO AND INTRAOCULAR LENS PLACEMENT ([place]);  Surgeon: Leandrew Koyanagi, MD;  Location: [place];  Service: Ophthalmology;  Laterality: Right;  TORIC   INTRAMEDULLARY (IM) NAIL INTERTROCHANTERIC Right 10/16/2021   Procedure: INTRAMEDULLARY (IM) NAIL  INTERTROCHANTRIC;  Surgeon: Renee Harder, MD;  Location: [facility];  Service: Orthopedics;  Laterality: Right;   MELANOMA EXCISION Left 05/19/2016   MIS. Left mid. lat. tricep. 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 S/P IM nail placement for right intertrochanteric hip fracture with Dr. Sharlet Salina on 10/16/2021. Pt slipped on the kithen floor. Pt was getting something from the microwave and pt tripped on her O2 tube. Pt was independent with ambulation prior to her fall. Pt fell before in the woods and pt tripped over a root 3 years ago (no injuries per pt). Pt has been using supplemental O2 for the past 2 years in November. Pt has interstitial lung  disease (idiopathic pulmonary fibrosis). Recent breathing test is stable. Back pain began when pt was in Rehab in [place] [facility]. Feels like it is sciatical. Pt radiates along the sciatic nerve and R L 5 dermatome. Pt is healing fine as far as her R hip surgery is concerned. Also has R medial knee pain due to arthritis. Had home health PT which involves seated knee extension, hip flexion, standing hip abduction, extension. Pt did some walking wiht a rw. Feels a lot of pain all over. Walking too far is limited due to her O2 situation.  PRECAUTIONS: SpO2 not under 90 %  SUBJECTIVE: Currently wearing a Holter monitor for 3 days, goes off it on Thursday, 2:42 pm. A-fib might be an electrical thing. HR goes up when moving. No symptoms currently.  Back seems to be doing well, no pain currently, bothers her sometimes. The pelvic tilts help. L shoulder is bothering her.       PAIN:  Are you having pain? 0/10 back pain currently.       TODAY'S TREATMENT:   04/21/2022     Therapeutic exercise    SpO2 94%, HR 64, 3 L O2   Seated B scapular retraction 10x5 seconds for 3 sets   Pt states exercise helps her low back    SpO2 96%+HR 60+,   Standing with B UE assist   Hip extension   R 10x  L 10x   SLS    R 10x5 seconds for 2 sets   L 10x5 seconds for 2 sets    SpO2 89% +, HR 90+ (improves with rest breaks)    Then with contralateral foot on furniture slider     R clockwise and counter clockwise 10x2 each direction    L clockwise and counter clockwise 10x2 each direction   Seated manually resisted trunk flexion isometrics in neutral 10x5 seconds for 2 sets  92%, HR 80+  After session:  3L SpO2 96%, HR 75    Improved exercise technique, movement at target joints, use of target muscles after mod verbal, visual, tactile cues.      Response to treatment Pt tolerated session well without aggravation of pain.      Clinical impression  Pt heart rate maintained to 60+ beats per minute today. Rest breaks after exercises needed to improve O2 saturation on 3 L. Continued working on improving glute and trunk strength as well as thoracic extension to decrease low back extension pressure when standing. Pt tolerated session well without aggravation of symptoms. Pt will benefit from continued skilled physical therapy services to decrease pain, improve strength and function.        PATIENT EDUCATION: Education details: ther-ex, HEP Person educated: Patient Education method: Explanation, Demonstration, Tactile cues, Verbal cues, and Handouts Education comprehension: verbalized understanding and returned demonstration   HOME EXERCISE PROGRAM: Access Code: [email or web address] URL:  [email or web address] Date: 01/20/2022 Prepared by: Joneen Boers  Exercises - Seated Transversus Abdominis Bracing  - 1 x daily - 7 x weekly - 3 sets - 10 reps - 5 seconds hold - Seated Gluteal Sets  - 1 x daily - 7 x weekly - 3 sets - 10 reps - 5 seconds hold - Discontinued 03/12/2022: Seated Scapular Retraction  - 1 x daily - 7 x weekly - 3 sets - 10 reps - 5 seconds  hold  Can perform aforementioned exercises in standing now.    - Seated Hip Adduction Isometrics with Ball  - 1 x daily - 7 x weekly -  2 sets - 10 reps - 5 seconds hold - Standing Hip Abduction with Counter Support  - 1 x daily - 7 x weekly - 3 sets - 10 reps - Discontinued on 03/09/2022: Seated Hip Abduction with Resistance  - 1 x daily - 7 x weekly - 2 sets - 10 reps  - Supine Posterior Pelvic Tilt  - 3 x daily - 7 x weekly - 3 sets - 10 reps - 5 seconds hold - Hooklying Clamshell with Resistance  - 1 x daily - 7 x weekly - 3 sets - 10 reps - Standing Posterior Pelvic Tilt  - 3 x daily - 7 x weekly - 3 sets - 10 reps - 5 seconds hold   PT Short Term Goals - 02/16/22 0941       PT SHORT TERM GOAL #1   Title Pt will be independent with her initial HEP to decrease pain, improve strength, function, and ability to stand and ambulate more comfortably.    Baseline No questions with HEP, has been doing them (02/16/2022)    Time 3    Period Weeks    Status Achieved    Target Date 02/05/22              PT Long Term Goals - 04/02/22 1024       PT LONG TERM GOAL #1   Title Pt will have a decrease in low back pain to 3/10 or less at worst to promote ability to stand and ambulate with less difficulty.    Baseline 6/10 low back pain at worst for the past 2 months (01/12/2022); 7/10 at most for the past 7 days (standing in the kitchen, more standing than normal becuase pt had guests over from [facility]) ( 02/16/2022);  6-7/10 back pain at worst for the past 7 days. Back pain at worst goes away faster since  starting PT. (03/09/2022). R low back pain has not been bothering her. Thoracic back pain is about a 6/10 at worst for the past 7 days (04/02/2022)    Time 8    Period Weeks    Status On-going    Target Date 05/07/22      PT LONG TERM GOAL #2   Title Pt will have a decrease in R LE pain to 3/10 or less at worst to promote ability to stand and ambulate will less difficulty.    Baseline 9/10 R LE pain at worst (01/12/2022); 3/10 R sciatic and L5 dermatome pain at most for the past 7 days; has R > L heel pain however which wakes her up at night. (02/16/2022); 4/10 R LE pain at most for the past 7 days. R heel pain however wakes her up at night. (03/12/2022), 3/10 R LE pain at worst for the past 7 days (04/02/2022)    Time 8    Period Weeks    Status Achieved    Target Date 05/07/22      PT LONG TERM GOAL #3   Title Pt will improve her FOTO score by at least 10 points as a demontsration of improved function.    Baseline Lumbar Spine FOTO 39 (01/12/2022); 46 (02/16/2022); 52 (03/12/2022)    Time 8    Period Weeks    Status Achieved    Target Date 05/07/22      PT LONG TERM GOAL #4   Title Pt will be able to ambulate at least 100 ft independently to promote mobility and return to prior level of  function.    Baseline Able to ambulate with rw short distance, about 40 ft with reproduction of pain. (01/12/2022); able to ambulate 120 ft with rw, SBA (02/16/2022); SPC 40 ft, CGA (03/12/2022); Pt able to ambulate 88 ft wiht SPC on L side (03/31/2022)    Time 8    Period Weeks    Status On-going    Target Date 05/07/22              Plan - 04/21/22 1543     Clinical Impression Statement Pt heart rate maintained to 60+ beats per minute today. Rest breaks after exercises needed to improve O2 saturation on 3 L. Continued working on improving glute and trunk strength as well as thoracic extension to decrease low back extension pressure when standing. Pt tolerated session well without aggravation of symptoms. Pt will  benefit from continued skilled physical therapy services to decrease pain, improve strength and function.    Personal Factors and Comorbidities Comorbidity 3+;Age;Time since onset of injury/illness/exacerbation;Fitness    Comorbidities Arthritis, atrial fibrillation, HTN, idiopathic pulmonary fibrosis    Examination-Activity Limitations Stairs;Bed Mobility;Stand;Sit;Lift;Transfers;Locomotion Level;Carry    Stability/Clinical Decision Making Stable/Uncomplicated    Clinical Decision Making Low    Rehab Potential Fair    PT Frequency 2x / week    PT Duration 8 weeks    PT Treatment/Interventions Therapeutic activities;Therapeutic exercise;Gait training;Balance training;Neuromuscular re-education;Patient/family education;Manual techniques;Dry needling;Electrical Stimulation;Iontophoresis '4mg'$ /ml Dexamethasone    PT Next Visit Plan Posture, thoracic extension, trunk and glute strength, lumbopelvic control during gait, manual techniques, modalities PRN    PT Home Exercise Plan Medbridge Access Code: [email or web address]    Consulted and Agree with Plan of Care Patient                            Joneen Boers PT, DPT  04/21/2022, 4:35 PM

## 2022-04-23 ENCOUNTER — Ambulatory Visit: Payer: Medicare PPO

## 2022-04-23 DIAGNOSIS — R262 Difficulty in walking, not elsewhere classified: Secondary | ICD-10-CM

## 2022-04-23 DIAGNOSIS — M6281 Muscle weakness (generalized): Secondary | ICD-10-CM

## 2022-04-23 DIAGNOSIS — M5431 Sciatica, right side: Secondary | ICD-10-CM

## 2022-04-23 DIAGNOSIS — M5459 Other low back pain: Secondary | ICD-10-CM | POA: Diagnosis not present

## 2022-04-23 DIAGNOSIS — Z9181 History of falling: Secondary | ICD-10-CM

## 2022-04-23 DIAGNOSIS — M5417 Radiculopathy, lumbosacral region: Secondary | ICD-10-CM

## 2022-04-23 NOTE — Therapy (Signed)
OUTPATIENT PHYSICAL THERAPY TREATMENT NOTE      Patient Name: Joyce Evans MRN: [id number] DOB:[birth year], 81 y.o., female Today's Date: 04/23/2022  PCP: Derinda Late, MD  REFERRING PROVIDER: Leonie Green, NP   PT End of Session - 04/23/22 1626     Visit Number 24    Number of Visits 33    Date for PT Re-Evaluation 05/07/22    Authorization Type [employer]    Progress Note Due on Visit 10    PT Start Time 1627    PT Stop Time 1708    PT Time Calculation (min) 41 min    Equipment Utilized During Treatment Gait belt    Activity Tolerance Patient tolerated treatment well;No increased pain    Behavior During Therapy WFL for tasks assessed/performed                               Past Medical History:  Diagnosis Date   Actinic keratosis    Arthritis    osteo - shoulders, fingers   Atrial fibrillation (HCC)    Dizziness    thinks because of diuretic   Family history of adverse reaction to anesthesia    daughter -PONV   Gallstones 2016, 2017   GERD (gastroesophageal reflux disease)    Glaucoma    Heart murmur    history of   Hypertension    Melanoma ([place]) 04/20/2016   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 Excised: 05/19/2016, margins free.   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   Tricuspid valve disorder    leak   Past Surgical History:  Procedure Laterality Date   ABDOMINAL HYSTERECTOMY  1987   partial   CARDIAC CATHETERIZATION  2010   [facility]   CATARACT EXTRACTION W/PHACO Right 08/28/2015   Procedure: CATARACT EXTRACTION PHACO AND INTRAOCULAR LENS PLACEMENT ([place]);  Surgeon: Leandrew Koyanagi, MD;  Location: [place];  Service: Ophthalmology;  Laterality: Right;  TORIC   INTRAMEDULLARY (IM) NAIL INTERTROCHANTERIC Right 10/16/2021   Procedure: INTRAMEDULLARY (IM) NAIL  INTERTROCHANTRIC;  Surgeon: Renee Harder, MD;  Location: [facility];  Service: Orthopedics;  Laterality: Right;   MELANOMA EXCISION Left 05/19/2016   MIS. Left mid. lat. tricep. 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 S/P IM nail placement for right intertrochanteric hip fracture with Dr. Sharlet Salina on 10/16/2021. Pt slipped on the kithen floor. Pt was getting something from the microwave and pt tripped on her O2 tube. Pt was independent with ambulation prior to her fall. Pt fell before in the woods and pt tripped over a root 3 years ago (no injuries per pt). Pt has been using supplemental O2 for the past 2 years in November. Pt has interstitial  lung disease (idiopathic pulmonary fibrosis). Recent breathing test is stable. Back pain began when pt was in Rehab in [place] [facility]. Feels like it is sciatical. Pt radiates along the sciatic nerve and R L 5 dermatome. Pt is healing fine as far as her R hip surgery is concerned. Also has R medial knee pain due to arthritis. Had home health PT which involves seated knee extension, hip flexion, standing hip abduction, extension. Pt did some walking wiht a rw. Feels a lot of pain all over. Walking too far is limited due to her O2 situation.  PRECAUTIONS: SpO2 not under 90 %  SUBJECTIVE: HR has been good but still goes down in the 40's at night. The R LE bothered a little bit after last session. No R LE pain currently. 6/10 R hip pain at worst for  the past 7 days. No R LE symptoms. Can feel the rod in her R hip when she lays on her R side. The R heel pain has been fine the past 2 nights, walking makes it go away. L shoulder has been bothering her some.     PAIN:  Are you having pain? 0/10 back pain currently.       TODAY'S TREATMENT:   04/23/2022     Therapeutic exercise    SpO2 97%, HR 71, 3 L O2  Seated manually resisted scapular retraction targeting the lower trap muscles to promote thoracic extension to decrease lumbar extension pressure as well as help decrease L shoulder pain.   R 10x5 seconds for 2 sets  L 10x5 seconds for 2 sets   SpO2 95%, 75 HR  TTP R greater trochanter  Seated manually resisted clamshell isometrics, hip less than 90 degrees flexion 10x3 with 5 second holds  Seated hip extension isometrics, on elevated mat table   R 10x5 seconds   L 10x5 seconds   SpO2 94%, HR 89+  SpO2 96%, HR 100  Gait with rw 50 ft SBA. SpO2 85%, HR 112, improves to 97%, HR 85 after rest  Then another 50 ft SpO2 85%, improves to 98%, HR 75 after rest   50 more ft, SpO2 86%, HR 91, improves to 96%, HR 68 after rest    L shoulder discomfort when using rw      Improved exercise technique, movement at target joints, use of target muscles after mod verbal, visual, tactile cues.      Response to treatment Pt tolerated session well without aggravation of pain.      Clinical impression   Decreased R LE radiating symptoms based on subjective reports. TTP R greater trochanter suggesting bursa involvement in R hip pain. Worked on isometric glute med muscle contraction to help address. Continued working on improving glute strength as well as thoracic extension to decrease low back extension pressure when standing. Worked on gait tolerance. Rest breaks secondary to decreased O2 saturation.  Pt tolerated session well without aggravation of symptoms. Pt will benefit from continued skilled physical therapy services  to decrease pain, improve strength and function.          PATIENT EDUCATION: Education details: ther-ex, HEP Person educated: Patient Education method: Explanation, Demonstration, Tactile cues, Verbal cues, and Handouts Education comprehension: verbalized understanding and returned demonstration   HOME EXERCISE PROGRAM: Access Code: [email or web address] URL: [email or web address] Date: 01/20/2022 Prepared by: Joneen Boers  Exercises - Seated Transversus Abdominis Bracing  - 1 x daily - 7 x weekly - 3 sets - 10 reps - 5 seconds hold - Seated  Gluteal Sets  - 1 x daily - 7 x weekly - 3 sets - 10 reps - 5 seconds hold - Discontinued 03/12/2022: Seated Scapular Retraction  - 1 x daily - 7 x weekly - 3 sets - 10 reps - 5 seconds  hold  Can perform aforementioned exercises in standing now.    - Seated Hip Adduction Isometrics with Ball  - 1 x daily - 7 x weekly - 2 sets - 10 reps - 5 seconds hold - Standing Hip Abduction with Counter Support  - 1 x daily - 7 x weekly - 3 sets - 10 reps - Discontinued on 03/09/2022: Seated Hip Abduction with Resistance  - 1 x daily - 7 x weekly - 2 sets - 10 reps  - Supine Posterior Pelvic Tilt  - 3 x daily - 7 x weekly - 3 sets - 10 reps - 5 seconds hold - Hooklying Clamshell with Resistance  - 1 x daily - 7 x weekly - 3 sets - 10 reps - Standing Posterior Pelvic Tilt  - 3 x daily - 7 x weekly - 3 sets - 10 reps - 5 seconds hold   PT Short Term Goals - 02/16/22 0941       PT SHORT TERM GOAL #1   Title Pt will be independent with her initial HEP to decrease pain, improve strength, function, and ability to stand and ambulate more comfortably.    Baseline No questions with HEP, has been doing them (02/16/2022)    Time 3    Period Weeks    Status Achieved    Target Date 02/05/22              PT Long Term Goals - 04/02/22 1024       PT LONG TERM GOAL #1   Title Pt will have a decrease in low back pain to 3/10 or less at worst to promote  ability to stand and ambulate with less difficulty.    Baseline 6/10 low back pain at worst for the past 2 months (01/12/2022); 7/10 at most for the past 7 days (standing in the kitchen, more standing than normal becuase pt had guests over from [facility]) ( 02/16/2022);  6-7/10 back pain at worst for the past 7 days. Back pain at worst goes away faster since starting PT. (03/09/2022). R low back pain has not been bothering her. Thoracic back pain is about a 6/10 at worst for the past 7 days (04/02/2022)    Time 8    Period Weeks    Status On-going    Target Date 05/07/22      PT LONG TERM GOAL #2   Title Pt will have a decrease in R LE pain to 3/10 or less at worst to promote ability to stand and ambulate will less difficulty.    Baseline 9/10 R LE pain at worst (01/12/2022); 3/10 R sciatic and L5 dermatome pain at most for the past 7 days; has R > L heel pain however which wakes her up at night. (02/16/2022); 4/10 R LE pain at most for the past 7 days. R heel pain however wakes her up at night. (03/12/2022), 3/10 R LE pain at worst for the past 7 days (04/02/2022)    Time 8    Period Weeks    Status Achieved    Target Date 05/07/22      PT LONG TERM GOAL #3   Title Pt will improve her FOTO score by at least  10 points as a demontsration of improved function.    Baseline Lumbar Spine FOTO 39 (01/12/2022); 46 (02/16/2022); 52 (03/12/2022)    Time 8    Period Weeks    Status Achieved    Target Date 05/07/22      PT LONG TERM GOAL #4   Title Pt will be able to ambulate at least 100 ft independently to promote mobility and return to prior level of function.    Baseline Able to ambulate with rw short distance, about 40 ft with reproduction of pain. (01/12/2022); able to ambulate 120 ft with rw, SBA (02/16/2022); SPC 40 ft, CGA (03/12/2022); Pt able to ambulate 88 ft wiht SPC on L side (03/31/2022)    Time 8    Period Weeks    Status On-going    Target Date 05/07/22              Plan - 04/23/22 1621      Clinical Impression Statement Decreased R LE radiating symptoms based on subjective reports. TTP R greater trochanter suggesting bursa involvement in R hip pain. Worked on isometric glute med muscle contraction to help address. Continued working on improving glute strength as well as thoracic extension to decrease low back extension pressure when standing. Worked on gait tolerance. Rest breaks secondary to decreased O2 saturation.  Pt tolerated session well without aggravation of symptoms. Pt will benefit from continued skilled physical therapy services to decrease pain, improve strength and function.    Personal Factors and Comorbidities Comorbidity 3+;Age;Time since onset of injury/illness/exacerbation;Fitness    Comorbidities Arthritis, atrial fibrillation, HTN, idiopathic pulmonary fibrosis    Examination-Activity Limitations Stairs;Bed Mobility;Stand;Sit;Lift;Transfers;Locomotion Level;Carry    Stability/Clinical Decision Making Stable/Uncomplicated    Rehab Potential Fair    PT Frequency 2x / week    PT Duration 8 weeks    PT Treatment/Interventions Therapeutic activities;Therapeutic exercise;Gait training;Balance training;Neuromuscular re-education;Patient/family education;Manual techniques;Dry needling;Electrical Stimulation;Iontophoresis '4mg'$ /ml Dexamethasone    PT Next Visit Plan Posture, thoracic extension, trunk and glute strength, lumbopelvic control during gait, manual techniques, modalities PRN    PT Home Exercise Plan Medbridge Access Code: [email or web address]    Consulted and Agree with Plan of Care Patient                             Joneen Boers PT, DPT  04/23/2022, 5:21 PM

## 2022-04-28 ENCOUNTER — Ambulatory Visit: Payer: Medicare PPO

## 2022-04-30 ENCOUNTER — Ambulatory Visit: Payer: Medicare PPO

## 2022-05-04 ENCOUNTER — Ambulatory Visit: Payer: Medicare PPO

## 2022-05-04 DIAGNOSIS — M5459 Other low back pain: Secondary | ICD-10-CM | POA: Diagnosis not present

## 2022-05-04 DIAGNOSIS — Z9181 History of falling: Secondary | ICD-10-CM

## 2022-05-04 DIAGNOSIS — M5417 Radiculopathy, lumbosacral region: Secondary | ICD-10-CM

## 2022-05-04 DIAGNOSIS — M6281 Muscle weakness (generalized): Secondary | ICD-10-CM

## 2022-05-04 DIAGNOSIS — R262 Difficulty in walking, not elsewhere classified: Secondary | ICD-10-CM

## 2022-05-04 DIAGNOSIS — M5431 Sciatica, right side: Secondary | ICD-10-CM

## 2022-05-04 NOTE — Therapy (Signed)
OUTPATIENT PHYSICAL THERAPY TREATMENT NOTE      Patient Name: Joyce Evans MRN: [id number] DOB:10/24/1940, 81 y.o., female Today's Date: 05/04/2022  PCP: Derinda Late, MD  REFERRING PROVIDER: Leonie Green, NP   PT End of Session - 05/04/22 1543     Visit Number 25    Number of Visits 33    Date for PT Re-Evaluation 05/07/22    Authorization Type [employer]    Progress Note Due on Visit 10    PT Start Time 1543    PT Stop Time 1626    PT Time Calculation (min) 43 min    Equipment Utilized During Treatment Gait belt    Activity Tolerance Patient tolerated treatment well;No increased pain    Behavior During Therapy WFL for tasks assessed/performed                                Past Medical History:  Diagnosis Date   Actinic keratosis    Arthritis    osteo - shoulders, fingers   Atrial fibrillation (HCC)    Dizziness    thinks because of diuretic   Family history of adverse reaction to anesthesia    daughter -PONV   Gallstones 2016, 2017   GERD (gastroesophageal reflux disease)    Glaucoma    Heart murmur    history of   Hypertension    Melanoma ([place]) 04/20/2016   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 Excised: 05/19/2016, margins free.   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   Tricuspid valve disorder    leak   Past Surgical History:  Procedure Laterality Date   ABDOMINAL HYSTERECTOMY  1987   partial   CARDIAC CATHETERIZATION  2010   [facility]   CATARACT EXTRACTION W/PHACO Right 08/28/2015   Procedure: CATARACT EXTRACTION PHACO AND INTRAOCULAR LENS PLACEMENT ([place]);  Surgeon: Leandrew Koyanagi, MD;  Location: [place];  Service: Ophthalmology;  Laterality: Right;  TORIC   INTRAMEDULLARY (IM) NAIL INTERTROCHANTERIC Right 10/16/2021   Procedure: INTRAMEDULLARY (IM)  NAIL INTERTROCHANTRIC;  Surgeon: Renee Harder, MD;  Location: [facility];  Service: Orthopedics;  Laterality: Right;   MELANOMA EXCISION Left 05/19/2016   MIS. Left mid. lat. tricep. 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 S/P IM nail placement for right intertrochanteric hip fracture with Dr. Sharlet Salina on 10/16/2021. Pt slipped on the kithen floor. Pt was getting something from the microwave and pt tripped on her O2 tube. Pt was independent with ambulation prior to her fall. Pt fell before in the woods and pt tripped over a root 3 years ago (no injuries per pt). Pt has been using supplemental O2 for the past 2 years in November. Pt has  interstitial lung disease (idiopathic pulmonary fibrosis). Recent breathing test is stable. Back pain began when pt was in Rehab in [facility]. Feels like it is sciatical. Pt radiates along the sciatic nerve and R L 5 dermatome. Pt is healing fine as far as her R hip surgery is concerned. Also has R medial knee pain due to arthritis. Had home health PT which involves seated knee extension, hip flexion, standing hip abduction, extension. Pt did some walking wiht a rw. Feels a lot of pain all over. Walking too far is limited due to her O2 situation.  PRECAUTIONS: SpO2 not under 90 %  SUBJECTIVE: Dr. said there were no gaps in her heart. Most of the time, her HR is in the 60s to 70s. Went to [employer] and adjusted O2 to 5 L when walking with rw  across the parking lot then put it back down to 3L. [place] and walked with the walker. Has been using her wc very little. Goes to the car with the walker. Uses wc if distance unknown. Did 50 minutes at the recumbent bike and O2 levels did not go down. Sitting is fine for her oxygen, on 3 L. Uses a SPC at home from one room to another/short distances. Back does not bother her except when standing to brush her teeth and hair and using her recumbent bike. R heel still comes and goes. Feels better with tylenol and bio freeze.    PAIN:  Are you having pain? 0/10 back pain currently in sitting.       TODAY'S TREATMENT:   05/04/2022     Therapeutic exercise    SpO2 95%, HR 66, 3 L O2  Standing posterior pelvic tilt 10x5 seconds for 3 sets   93%-97% , HR 85  SLS with B UE assist  foot on furniture slider   Circles clockwise/counter clockwise     R 10x3    L 10x3    SpO2 90%, improves to 96%, HR 84  Forward step up onto 4 inch step with B UE assist   R 5x3  L 5x3  89% SpO2, HR 90s, improves to (after first set) 97%, HR 84 after rest  88 % SpO2, HR 89 after second set, improves to 98%, HR 76 after rest 86% SpO2, HR 90s after third set, improves to 98%, HR 82 after rest    No low back pain with exercise    Therapeutic rest breaks utilized for oxygen and muscle recovery.       Improved exercise technique, movement at target joints, use of target muscles after mod verbal, visual, tactile cues.      Response to treatment Pt tolerated session well without aggravation of pain. Pt states that the exercises helped her back.       Clinical impression  Worked on trunk and glute strength to decrease extension stress to low back. Good muscle use felt reported. Per pt, the exercises helped with her back. Pt tolerated session well without aggravation of symptoms.  Pt will benefit from continued skilled physical therapy services to decrease pain,  improve strength and function.          PATIENT EDUCATION: Education details: ther-ex, HEP Person educated: Patient Education method: Explanation, Demonstration, Tactile cues, Verbal cues, and Handouts Education comprehension: verbalized understanding and returned demonstration   HOME EXERCISE PROGRAM: Access Code: [email or web address] URL: [email or web address] Date: 01/20/2022 Prepared by: Joneen Boers  Exercises - Seated Transversus Abdominis Bracing  - 1  x daily - 7 x weekly - 3 sets - 10 reps - 5 seconds hold - Seated Gluteal Sets  - 1 x daily - 7 x weekly - 3 sets - 10 reps - 5 seconds hold - Discontinued 03/12/2022: Seated Scapular Retraction  - 1 x daily - 7 x weekly - 3 sets - 10 reps - 5 seconds  hold  Can perform aforementioned exercises in standing now.    - Seated Hip Adduction Isometrics with Ball  - 1 x daily - 7 x weekly - 2 sets - 10 reps - 5 seconds hold - Standing Hip Abduction with Counter Support  - 1 x daily - 7 x weekly - 3 sets - 10 reps - Discontinued on 03/09/2022: Seated Hip Abduction with Resistance  - 1 x daily - 7 x weekly - 2 sets - 10 reps  - Supine Posterior Pelvic Tilt  - 3 x daily - 7 x weekly - 3 sets - 10 reps - 5 seconds hold - Hooklying Clamshell with Resistance  - 1 x daily - 7 x weekly - 3 sets - 10 reps - Standing Posterior Pelvic Tilt  - 3 x daily - 7 x weekly - 3 sets - 10 reps - 5 seconds hold   PT Short Term Goals - 02/16/22 0941       PT SHORT TERM GOAL #1   Title Pt will be independent with her initial HEP to decrease pain, improve strength, function, and ability to stand and ambulate more comfortably.    Baseline No questions with HEP, has been doing them (02/16/2022)    Time 3    Period Weeks    Status Achieved    Target Date 02/05/22              PT Long Term Goals - 04/02/22 1024       PT LONG TERM GOAL #1   Title Pt will have a decrease in low back pain to 3/10 or less at worst to promote ability to stand and  ambulate with less difficulty.    Baseline 6/10 low back pain at worst for the past 2 months (01/12/2022); 7/10 at most for the past 7 days (standing in the kitchen, more standing than normal becuase pt had guests over from [facility]) ( 02/16/2022);  6-7/10 back pain at worst for the past 7 days. Back pain at worst goes away faster since starting PT. (03/09/2022). R low back pain has not been bothering her. Thoracic back pain is about a 6/10 at worst for the past 7 days (04/02/2022)    Time 8    Period Weeks    Status On-going    Target Date 05/07/22      PT LONG TERM GOAL #2   Title Pt will have a decrease in R LE pain to 3/10 or less at worst to promote ability to stand and ambulate will less difficulty.    Baseline 9/10 R LE pain at worst (01/12/2022); 3/10 R sciatic and L5 dermatome pain at most for the past 7 days; has R > L heel pain however which wakes her up at night. (02/16/2022); 4/10 R LE pain at most for the past 7 days. R heel pain however wakes her up at night. (03/12/2022), 3/10 R LE pain at worst for the past 7 days (04/02/2022)    Time 8    Period Weeks    Status Achieved    Target Date 05/07/22  PT LONG TERM GOAL #3   Title Pt will improve her FOTO score by at least 10 points as a demontsration of improved function.    Baseline Lumbar Spine FOTO 39 (01/12/2022); 46 (02/16/2022); 52 (03/12/2022)    Time 8    Period Weeks    Status Achieved    Target Date 05/07/22      PT LONG TERM GOAL #4   Title Pt will be able to ambulate at least 100 ft independently to promote mobility and return to prior level of function.    Baseline Able to ambulate with rw short distance, about 40 ft with reproduction of pain. (01/12/2022); able to ambulate 120 ft with rw, SBA (02/16/2022); SPC 40 ft, CGA (03/12/2022); Pt able to ambulate 88 ft wiht SPC on L side (03/31/2022)    Time 8    Period Weeks    Status On-going    Target Date 05/07/22              Plan - 05/04/22 1542     Clinical Impression  Statement Worked on trunk and glute strength to decrease extension stress to low back. Good muscle use felt reported. Per pt, the exercises helped with her back. Pt tolerated session well without aggravation of symptoms.  Pt will benefit from continued skilled physical therapy services to decrease pain, improve strength and function.    Personal Factors and Comorbidities Comorbidity 3+;Age;Time since onset of injury/illness/exacerbation;Fitness    Comorbidities Arthritis, atrial fibrillation, HTN, idiopathic pulmonary fibrosis    Examination-Activity Limitations Stairs;Bed Mobility;Stand;Sit;Lift;Transfers;Locomotion Level;Carry    Stability/Clinical Decision Making Stable/Uncomplicated    Rehab Potential Fair    PT Frequency 2x / week    PT Duration 8 weeks    PT Treatment/Interventions Therapeutic activities;Therapeutic exercise;Gait training;Balance training;Neuromuscular re-education;Patient/family education;Manual techniques;Dry needling;Electrical Stimulation;Iontophoresis '4mg'$ /ml Dexamethasone    PT Next Visit Plan Posture, thoracic extension, trunk and glute strength, lumbopelvic control during gait, manual techniques, modalities PRN    PT Home Exercise Plan Medbridge Access Code: [email or web address]    Consulted and Agree with Plan of Care Patient                             Joneen Boers PT, DPT  05/04/2022, 4:31 PM

## 2022-05-06 ENCOUNTER — Ambulatory Visit: Payer: Medicare PPO

## 2022-05-06 DIAGNOSIS — M5459 Other low back pain: Secondary | ICD-10-CM | POA: Diagnosis not present

## 2022-05-06 DIAGNOSIS — M6281 Muscle weakness (generalized): Secondary | ICD-10-CM

## 2022-05-06 DIAGNOSIS — R262 Difficulty in walking, not elsewhere classified: Secondary | ICD-10-CM

## 2022-05-06 DIAGNOSIS — M5431 Sciatica, right side: Secondary | ICD-10-CM

## 2022-05-06 DIAGNOSIS — Z9181 History of falling: Secondary | ICD-10-CM

## 2022-05-06 DIAGNOSIS — M5417 Radiculopathy, lumbosacral region: Secondary | ICD-10-CM

## 2022-05-06 NOTE — Therapy (Signed)
OUTPATIENT PHYSICAL THERAPY TREATMENT NOTE      Patient Name: Joyce Evans MRN: [id number] DOB:05-23-1941, 81 y.o., female Today's Date: 05/06/2022  PCP: Derinda Late, MD  REFERRING PROVIDER: Leonie Green, NP   PT End of Session - 05/06/22 1626     Visit Number 26    Number of Visits 43    Date for PT Re-Evaluation 07/02/22    Authorization Type [employer]    Progress Note Due on Visit 10    PT Start Time 1626    PT Stop Time 1708    PT Time Calculation (min) 42 min    Equipment Utilized During Treatment Gait belt    Activity Tolerance Patient tolerated treatment well;No increased pain    Behavior During Therapy WFL for tasks assessed/performed                                 Past Medical History:  Diagnosis Date   Actinic keratosis    Arthritis    osteo - shoulders, fingers   Atrial fibrillation (HCC)    Dizziness    thinks because of diuretic   Family history of adverse reaction to anesthesia    daughter -PONV   Gallstones 2016, 2017   GERD (gastroesophageal reflux disease)    Glaucoma    Heart murmur    history of   Hypertension    Melanoma ([place]) 04/20/2016   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 Excised: 05/19/2016, margins free.   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   Tricuspid valve disorder    leak   Past Surgical History:  Procedure Laterality Date   ABDOMINAL HYSTERECTOMY  1987   partial   CARDIAC CATHETERIZATION  2010   [facility]   CATARACT EXTRACTION W/PHACO Right 08/28/2015   Procedure: CATARACT EXTRACTION PHACO AND INTRAOCULAR LENS PLACEMENT ([place]);  Surgeon: Leandrew Koyanagi, MD;  Location: [facility];  Service: Ophthalmology;  Laterality: Right;  TORIC   INTRAMEDULLARY (IM) NAIL INTERTROCHANTERIC Right 10/16/2021   Procedure: INTRAMEDULLARY (IM)  NAIL INTERTROCHANTRIC;  Surgeon: Renee Harder, MD;  Location: [facility];  Service: Orthopedics;  Laterality: Right;   MELANOMA EXCISION Left 05/19/2016   MIS. Left mid. lat. tricep. 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 S/P IM nail placement for right intertrochanteric hip fracture with Dr. Sharlet Salina on 10/16/2021. Pt slipped on the kithen floor. Pt was getting something from the microwave and pt tripped on her O2 tube.  Pt was independent with ambulation prior to her fall. Pt fell before in the woods and pt tripped over a root 3 years ago (no injuries per pt). Pt has been using supplemental O2 for the past 2 years in November. Pt has interstitial lung disease (idiopathic pulmonary fibrosis). Recent breathing test is stable. Back pain began when pt was in Rehab in [place]. Feels like it is sciatical. Pt radiates along the sciatic nerve and R L 5 dermatome. Pt is healing fine as far as her R hip surgery is concerned. Also has R medial knee pain due to arthritis. Had home health PT which involves seated knee extension, hip flexion, standing hip abduction, extension. Pt did some walking wiht a rw. Feels a lot of pain all over. Walking too far is limited due to her O2  situation.  PRECAUTIONS: SpO2 not under 90 %  SUBJECTIVE: Used the walker yesterday when she went out and did fine. The pelvic tilts help with the back pain. Has been working on her breathing and the recumbent bike at home.    PAIN:  Are you having pain? 0/10 back pain currently in sitting.       TODAY'S TREATMENT:   05/06/2022     Therapeutic exercise   At start of session:   SpO2 98%, HR 57, 4 L O2  Then at 3 L O2: SpO2 99%, HR 54  Gait with rw SBA 200 ft. SpO2 94%, HR 70   Gait with SPC L side 210 ft, CGA SpO2 90% HR 103  (improved to 97 %, HR 79 after about 1 minute rest)   Reviewed progress with PT towards goals.    Forward step up onto 4 inch step with B UE assist   R 5x2  L 5x2  93% SpO2, HR 107, improves to (after first set) 97%, HR 81 after sitting rest break  89 % SpO2, HR 105  after second set, improves to 98%, HR 98 after sitting rest break   Therapeutic rest breaks utilized for oxygen and muscle recovery.     Improved exercise technique, movement at target joints, use of target muscles after mod verbal, visual, tactile cues.      Response to treatment Pt tolerated session well without reports of pain      Clinical impression  Pt demonstrates significant improvement in ability to ambulate longer distances with both rw (200 ft) and SPC (210 ft) in level surface with 3 L O2 and SpO2 90% or above. Pt also demontrates overall decreased low back and R LE pain since initial evaluation. Pt is making progress with PT towards goals. Pt will benefit from continued skilled physical therapy services to improve ability to ambulate independently and on uneven surfaces, as well as improve activity tolerance and continue to decrease low back pain. Challenges to progress include bradycardia and pulmonary fibrosis.         PATIENT EDUCATION: Education details: ther-ex, HEP Person educated: Patient Education method: Explanation, Demonstration,  Tactile cues, Verbal cues, and Handouts Education comprehension: verbalized understanding and returned demonstration   HOME EXERCISE PROGRAM: Access Code: [email or web address] URL: [email or web address] Date: 01/20/2022 Prepared by: Joneen Boers  Exercises - Seated Transversus Abdominis Bracing  - 1 x daily - 7 x weekly - 3 sets - 10 reps - 5 seconds hold - Seated Gluteal Sets  - 1 x daily - 7 x weekly - 3 sets - 10 reps - 5 seconds hold - Discontinued 03/12/2022: Seated Scapular Retraction  - 1  x daily - 7 x weekly - 3 sets - 10 reps - 5 seconds  hold  Can perform aforementioned exercises in standing now.    - Seated Hip Adduction Isometrics with Ball  - 1 x daily - 7 x weekly - 2 sets - 10 reps - 5 seconds hold - Standing Hip Abduction with Counter Support  - 1 x daily - 7 x weekly - 3 sets - 10 reps - Discontinued on 03/09/2022: Seated Hip Abduction with Resistance  - 1 x daily - 7 x weekly - 2 sets - 10 reps  - Supine Posterior Pelvic Tilt  - 3 x daily - 7 x weekly - 3 sets - 10 reps - 5 seconds hold - Hooklying Clamshell with Resistance  - 1 x daily - 7 x weekly - 3 sets - 10 reps - Standing Posterior Pelvic Tilt  - 3 x daily - 7 x weekly - 3 sets - 10 reps - 5 seconds hold   PT Short Term Goals - 02/16/22 0941       PT SHORT TERM GOAL #1   Title Pt will be independent with her initial HEP to decrease pain, improve strength, function, and ability to stand and ambulate more comfortably.    Baseline No questions with HEP, has been doing them (02/16/2022)    Time 3    Period Weeks    Status Achieved    Target Date 02/05/22              PT Long Term Goals - 05/06/22 1629       PT LONG TERM GOAL #1   Title Pt will have a decrease in low back pain to 3/10 or less at worst to promote ability to stand and ambulate with less difficulty.    Baseline 6/10 low back pain at worst for the past 2 months (01/12/2022); 7/10 at most for the past 7 days (standing in the kitchen, more  standing than normal becuase pt had guests over from [facility]: [place] Rhonda W) ( 02/16/2022);  6-7/10 back pain at worst for the past 7 days. Back pain at worst goes away faster since starting PT. (03/09/2022). R low back pain has not been bothering her. Thoracic back pain is about a 6/10 at worst for the past 7 days (04/02/2022); 4/10 low back pain at worst for the past 7 days (05/06/2022)    Time 8    Period Weeks    Status Partially Met    Target Date 07/02/22      PT LONG TERM GOAL #2   Title Pt will have a decrease in R LE pain to 3/10 or less at worst to promote ability to stand and ambulate will less difficulty.    Baseline 9/10 R LE pain at worst (01/12/2022); 3/10 R sciatic and L5 dermatome pain at most for the past 7 days; has R > L heel pain however which wakes her up at night. (02/16/2022); 4/10 R LE pain at most for the past 7 days. R heel pain however wakes her up at night. (03/12/2022), 3/10 R LE pain at worst for the past 7 days (04/02/2022); R heel bothered her last night (possibly plantar fasciitis), 3/10 R LE pain at most for the past 7 days (05/06/2022)    Time 8    Period Weeks    Status Achieved    Target Date 05/07/22      PT LONG TERM GOAL #3   Title Pt will improve her FOTO  score by at least 10 points as a demontsration of improved function.    Baseline Lumbar Spine FOTO 39 (01/12/2022); 46 (02/16/2022); 52 (03/12/2022)    Time 8    Period Weeks    Status Achieved    Target Date 05/07/22      PT LONG TERM GOAL #4   Title Pt will be able to ambulate at least 100 ft independently to promote mobility and return to prior level of function.    Baseline Able to ambulate with rw short distance, about 40 ft with reproduction of pain. (01/12/2022); able to ambulate 120 ft with rw, SBA (02/16/2022); SPC 40 ft, CGA (03/12/2022); Pt able to ambulate 88 ft with SPC on L side (03/31/2022); 210 ft with SPC on L side, rw 200 ft, no pain (05/06/2022)    Time 8    Period Weeks    Status Partially Met    Target  Date 07/02/22              Plan - 05/06/22 1620     Clinical Impression Statement Pt demonstrates significant improvement in ability to ambulate longer distances with both rw (200 ft) and SPC (210 ft) in level surface with 3 L O2 and SpO2 90% or above. Pt also demontrates overall decreased low back and R LE pain since initial evaluation. Pt is making progress with PT towards goals. Pt will benefit from continued skilled physical therapy services to improve ability to ambulate independently and on uneven surfaces, as well as improve activity tolerance and continue to decrease low back pain. Challenges to progress include bradycardia and pulmonary fibrosis.    Personal Factors and Comorbidities Comorbidity 3+;Age;Time since onset of injury/illness/exacerbation;Fitness    Comorbidities Arthritis, atrial fibrillation, HTN, idiopathic pulmonary fibrosis    Examination-Activity Limitations Stairs;Bed Mobility;Stand;Sit;Lift;Transfers;Locomotion Level;Carry    Stability/Clinical Decision Making Stable/Uncomplicated    Clinical Decision Making Low    Rehab Potential Fair    PT Frequency 2x / week    PT Duration 8 weeks    PT Treatment/Interventions Therapeutic activities;Therapeutic exercise;Gait training;Balance training;Neuromuscular re-education;Patient/family education;Manual techniques;Dry needling;Electrical Stimulation;Iontophoresis 45m/ml Dexamethasone    PT Next Visit Plan Posture, thoracic extension, trunk and glute strength, lumbopelvic control during gait, manual techniques, modalities PRN    PT Home Exercise Plan Medbridge Access Code: [email or web address]   Consulted and Agree with Plan of Care Patient                             MJoneen BoersPT, DPT  05/06/2022, 6:23 PM

## 2022-05-11 ENCOUNTER — Ambulatory Visit: Payer: Medicare PPO

## 2022-05-11 DIAGNOSIS — M5417 Radiculopathy, lumbosacral region: Secondary | ICD-10-CM

## 2022-05-11 DIAGNOSIS — M5459 Other low back pain: Secondary | ICD-10-CM

## 2022-05-11 DIAGNOSIS — R262 Difficulty in walking, not elsewhere classified: Secondary | ICD-10-CM

## 2022-05-11 DIAGNOSIS — M5431 Sciatica, right side: Secondary | ICD-10-CM

## 2022-05-11 DIAGNOSIS — Z9181 History of falling: Secondary | ICD-10-CM

## 2022-05-11 DIAGNOSIS — M6281 Muscle weakness (generalized): Secondary | ICD-10-CM

## 2022-05-11 NOTE — Therapy (Signed)
OUTPATIENT PHYSICAL THERAPY TREATMENT NOTE      Patient Name: Joyce Evans MRN: [id number] DOB:Sep 01, 1940, 81 y.o., female Today's Date: 05/11/2022  PCP: Derinda Late, MD  REFERRING PROVIDER: Leonie Green, NP   PT End of Session - 05/11/22 1331     Visit Number 27    Number of Visits 48    Date for PT Re-Evaluation 07/02/22    Authorization Type [employer]    Progress Note Due on Visit 10    PT Start Time 1331    PT Stop Time 1411    PT Time Calculation (min) 40 min    Equipment Utilized During Treatment Gait belt    Activity Tolerance Patient tolerated treatment well;No increased pain    Behavior During Therapy WFL for tasks assessed/performed                                  Past Medical History:  Diagnosis Date   Actinic keratosis    Arthritis    osteo - shoulders, fingers   Atrial fibrillation (HCC)    Dizziness    thinks because of diuretic   Family history of adverse reaction to anesthesia    daughter -PONV   Gallstones 2016, 2017   GERD (gastroesophageal reflux disease)    Glaucoma    Heart murmur    history of   Hypertension    Melanoma ([place]) 04/20/2016   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 Excised: 05/19/2016, margins free.   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   Tricuspid valve disorder    leak   Past Surgical History:  Procedure Laterality Date   ABDOMINAL HYSTERECTOMY  1987   partial   CARDIAC CATHETERIZATION  2010   [facility]   CATARACT EXTRACTION W/PHACO Right 08/28/2015   Procedure: CATARACT EXTRACTION PHACO AND INTRAOCULAR LENS PLACEMENT ([place]);  Surgeon: Leandrew Koyanagi, MD;  Location: [place];  Service: Ophthalmology;  Laterality: Right;  TORIC   INTRAMEDULLARY (IM) NAIL INTERTROCHANTERIC Right 10/16/2021   Procedure: INTRAMEDULLARY (IM)  NAIL INTERTROCHANTRIC;  Surgeon: Renee Harder, MD;  Location: [facility];  Service: Orthopedics;  Laterality: Right;   MELANOMA EXCISION Left 05/19/2016   MIS. Left mid. lat. tricep. 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 S/P IM nail placement for right intertrochanteric hip fracture with Dr. Sharlet Salina on 10/16/2021. Pt slipped on the kithen floor. Pt was getting something from the microwave and pt tripped on her O2 tube. Pt was independent with ambulation prior to her fall. Pt fell before in the woods and pt tripped over a root 3 years ago (no injuries per pt). Pt has been using supplemental O2 for the past 2 years in November.  Pt has interstitial lung disease (idiopathic pulmonary fibrosis). Recent breathing test is stable. Back pain began when pt was in Rehab in [place] [facility]. Feels like it is sciatical. Pt radiates along the sciatic nerve and R L 5 dermatome. Pt is healing fine as far as her R hip surgery is concerned. Also has R medial knee pain due to arthritis. Had home health PT which involves seated knee extension, hip flexion, standing hip abduction, extension. Pt did some walking wiht a rw. Feels a lot of pain all over. Walking too far is limited due to her O2 situation.  PRECAUTIONS: SpO2 not under 90 %  SUBJECTIVE: R knee and L shoulder are bothering hre today. Has a little fluid on her knee. 7/10 R knee when walking and straightening her R leg currently. Cardiologist is  not doing anything for her heart rate at this point. Just stay off the beta blocker. Was walking on gravel with a rw which bothered her R knee afterwards.    PAIN:  Are you having pain?        TODAY'S TREATMENT:   05/11/2022   Therapeutic exercise   Seated R leg extension : Pt demonstrates R hip IR     At start of session:   3 L O2: SpO2 99%, HR 61  Seated hip ER   R 10x5 seconds for 3 sets  Seated hip adduction folded pillow squeeze isometrics 10x5 seconds for 3 sest   Reclined   Hooklying   Posterior pelvic tilt 10x5 seconds for 2 sets    Then with march 10x2    R sciatic/posterior thigh symptoms which eases with rest.      Clamshell isometrics 10x5 seconds for 3 sets    B scapular retraction to promote thoracic extension 10x5 seconds    Therapeutic rest breaks utilized for oxygen and muscle recovery.     Improved exercise technique, movement at target joints, use of target muscles after mod verbal, visual, tactile cues.      Response to treatment Decreased R knee pain reported after session        Clinical impression  Worked on nonweightbearing exercises for her back secondary to R knee pain. Continued working on trunk and hip strengthening and gentle thoracic extension to decrease stress to her low back. Also worked on hip strengthening to decrease R femoral IR and adduction with standing activities. Decreased R knee pain reported after session. Pt will benefit from continued skilled physical therapy services to improve ability to ambulate independently and on uneven surfaces, as well as improve activity tolerance and continue to decrease low back pain. Challenges to progress include bradycardia and pulmonary fibrosis.         PATIENT EDUCATION: Education details: ther-ex, HEP Person educated: Patient Education method: Explanation, Demonstration, Tactile cues, Verbal cues, and Handouts Education comprehension: verbalized understanding and  returned demonstration   HOME EXERCISE PROGRAM: Access Code: [email or web address] URL: [email or web address] Date: 01/20/2022 Prepared by: Joneen Boers  Exercises - Seated Transversus Abdominis Bracing  - 1 x daily - 7 x weekly - 3 sets - 10 reps - 5 seconds hold - Seated Gluteal Sets  - 1 x daily - 7 x weekly - 3 sets - 10 reps - 5 seconds hold - Discontinued 03/12/2022: Seated Scapular Retraction  - 1 x daily - 7 x weekly - 3 sets - 10 reps - 5 seconds  hold  Can perform aforementioned exercises in standing now.    - Seated Hip Adduction Isometrics with Diona Foley  -  1 x daily - 7 x weekly - 2 sets - 10 reps - 5 seconds hold - Standing Hip Abduction with Counter Support  - 1 x daily - 7 x weekly - 3 sets - 10 reps - Discontinued on 03/09/2022: Seated Hip Abduction with Resistance  - 1 x daily - 7 x weekly - 2 sets - 10 reps  - Supine Posterior Pelvic Tilt  - 3 x daily - 7 x weekly - 3 sets - 10 reps - 5 seconds hold - Hooklying Clamshell with Resistance  - 1 x daily - 7 x weekly - 3 sets - 10 reps - Standing Posterior Pelvic Tilt  - 3 x daily - 7 x weekly - 3 sets - 10 reps - 5 seconds hold   PT Short Term Goals - 02/16/22 0941       PT SHORT TERM GOAL #1   Title Pt will be independent with her initial HEP to decrease pain, improve strength, function, and ability to stand and ambulate more comfortably.    Baseline No questions with HEP, has been doing them (02/16/2022)    Time 3    Period Weeks    Status Achieved    Target Date 02/05/22              PT Long Term Goals - 05/06/22 1629       PT LONG TERM GOAL #1   Title Pt will have a decrease in low back pain to 3/10 or less at worst to promote ability to stand and ambulate with less difficulty.    Baseline 6/10 low back pain at worst for the past 2 months (01/12/2022); 7/10 at most for the past 7 days (standing in the kitchen, more standing than normal becuase pt had guests over from [facility]) ( 02/16/2022);  6-7/10 back  pain at worst for the past 7 days. Back pain at worst goes away faster since starting PT. (03/09/2022). R low back pain has not been bothering her. Thoracic back pain is about a 6/10 at worst for the past 7 days (04/02/2022); 4/10 low back pain at worst for the past 7 days (05/06/2022)    Time 8    Period Weeks    Status Partially Met    Target Date 07/02/22      PT LONG TERM GOAL #2   Title Pt will have a decrease in R LE pain to 3/10 or less at worst to promote ability to stand and ambulate will less difficulty.    Baseline 9/10 R LE pain at worst (01/12/2022); 3/10 R sciatic and L5 dermatome pain at most for the past 7 days; has R > L heel pain however which wakes her up at night. (02/16/2022); 4/10 R LE pain at most for the past 7 days. R heel pain however wakes her up at night. (03/12/2022), 3/10 R LE pain at worst for the past 7 days (04/02/2022); R heel bothered her last night (possibly plantar fasciitis), 3/10 R LE pain at most for the past 7 days (05/06/2022)    Time 8    Period Weeks    Status Achieved    Target Date 05/07/22      PT LONG TERM GOAL #3   Title Pt will improve her FOTO score by at least 10 points as a demontsration of improved function.    Baseline Lumbar Spine FOTO 39 (01/12/2022); 46 (02/16/2022); 52 (03/12/2022)    Time 8    Period Weeks  Status Achieved    Target Date 05/07/22      PT LONG TERM GOAL #4   Title Pt will be able to ambulate at least 100 ft independently to promote mobility and return to prior level of function.    Baseline Able to ambulate with rw short distance, about 40 ft with reproduction of pain. (01/12/2022); able to ambulate 120 ft with rw, SBA (02/16/2022); SPC 40 ft, CGA (03/12/2022); Pt able to ambulate 88 ft with SPC on L side (03/31/2022); 210 ft with SPC on L side, rw 200 ft, no pain (05/06/2022)    Time 8    Period Weeks    Status Partially Met    Target Date 07/02/22              Plan - 05/11/22 1330     Clinical Impression Statement  Worked on nonweightbearing exercises for her back secondary to R knee pain. Continued working on trunk and hip strengthening and gentle thoracic extension to decrease stress to her low back. Also worked on hip strengthening to decrease R femoral IR and adduction with standing activities. Decreased R knee pain reported after session. Pt will benefit from continued skilled physical therapy services to improve ability to ambulate independently and on uneven surfaces, as well as improve activity tolerance and continue to decrease low back pain. Challenges to progress include bradycardia and pulmonary fibrosis.    Personal Factors and Comorbidities Comorbidity 3+;Age;Time since onset of injury/illness/exacerbation;Fitness    Comorbidities Arthritis, atrial fibrillation, HTN, idiopathic pulmonary fibrosis    Examination-Activity Limitations Stairs;Bed Mobility;Stand;Sit;Lift;Transfers;Locomotion Level;Carry    Stability/Clinical Decision Making Stable/Uncomplicated    Rehab Potential Fair    PT Frequency 2x / week    PT Duration 8 weeks    PT Treatment/Interventions Therapeutic activities;Therapeutic exercise;Gait training;Balance training;Neuromuscular re-education;Patient/family education;Manual techniques;Dry needling;Electrical Stimulation;Iontophoresis 74m/ml Dexamethasone    PT Next Visit Plan Posture, thoracic extension, trunk and glute strength, lumbopelvic control during gait, manual techniques, modalities PRN    PT Home Exercise Plan Medbridge Access Code: [email or web address]   Consulted and Agree with Plan of Care Patient                              MJoneen BoersPT, DPT  05/11/2022, 4:41 PM

## 2022-05-13 ENCOUNTER — Ambulatory Visit: Payer: Medicare PPO | Attending: Orthopedic Surgery

## 2022-05-13 DIAGNOSIS — M6281 Muscle weakness (generalized): Secondary | ICD-10-CM | POA: Diagnosis present

## 2022-05-13 DIAGNOSIS — M5459 Other low back pain: Secondary | ICD-10-CM | POA: Diagnosis not present

## 2022-05-13 DIAGNOSIS — Z9181 History of falling: Secondary | ICD-10-CM | POA: Diagnosis present

## 2022-05-13 DIAGNOSIS — M5431 Sciatica, right side: Secondary | ICD-10-CM | POA: Diagnosis present

## 2022-05-13 DIAGNOSIS — R262 Difficulty in walking, not elsewhere classified: Secondary | ICD-10-CM

## 2022-05-13 DIAGNOSIS — M5417 Radiculopathy, lumbosacral region: Secondary | ICD-10-CM

## 2022-05-13 NOTE — Therapy (Signed)
OUTPATIENT PHYSICAL THERAPY TREATMENT NOTE      Patient Name: Joyce Evans MRN: [id number] DOB:Sep 05, 1940, 81 y.o., female Today's Date: 05/13/2022  PCP: Derinda Late, MD  REFERRING PROVIDER: Leonie Green, NP   PT End of Session - 05/13/22 1550     Visit Number 28    Number of Visits 54    Date for PT Re-Evaluation 07/02/22    Authorization Type [employer]    Progress Note Due on Visit 10    PT Start Time 1550    PT Stop Time 1630    PT Time Calculation (min) 40 min    Equipment Utilized During Treatment --    Activity Tolerance Patient tolerated treatment well;No increased pain    Behavior During Therapy WFL for tasks assessed/performed                                   Past Medical History:  Diagnosis Date   Actinic keratosis    Arthritis    osteo - shoulders, fingers   Atrial fibrillation (HCC)    Dizziness    thinks because of diuretic   Family history of adverse reaction to anesthesia    daughter -PONV   Gallstones 2016, 2017   GERD (gastroesophageal reflux disease)    Glaucoma    Heart murmur    history of   Hypertension    Melanoma ([place]) 04/20/2016   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 Excised: 05/19/2016, margins free.   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   Tricuspid valve disorder    leak   Past Surgical History:  Procedure Laterality Date   ABDOMINAL HYSTERECTOMY  1987   partial   CARDIAC CATHETERIZATION  2010   [facility]   CATARACT EXTRACTION W/PHACO Right 08/28/2015   Procedure: CATARACT EXTRACTION PHACO AND INTRAOCULAR LENS PLACEMENT ([place]);  Surgeon: Leandrew Koyanagi, MD;  Location: [place];  Service: Ophthalmology;  Laterality: Right;  TORIC   INTRAMEDULLARY (IM) NAIL INTERTROCHANTERIC Right 10/16/2021   Procedure: INTRAMEDULLARY (IM) NAIL  INTERTROCHANTRIC;  Surgeon: Renee Harder, MD;  Location: [facility];  Service: Orthopedics;  Laterality: Right;   MELANOMA EXCISION Left 05/19/2016   MIS. Left mid. lat. tricep. 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 S/P IM nail placement for right intertrochanteric hip fracture with Dr. Sharlet Salina on 10/16/2021. Pt slipped on the kithen floor. Pt was getting something from the microwave and pt tripped on her O2 tube. Pt was independent with ambulation prior to her fall. Pt fell before in the woods and pt tripped over a root 3 years ago (no injuries per pt). Pt has been using supplemental O2 for the past 2 years in November.  Pt has interstitial lung disease (idiopathic pulmonary fibrosis). Recent breathing test is stable. Back pain began when pt was in Rehab in [place] [facility]. Feels like it is sciatical. Pt radiates along the sciatic nerve and R L 5 dermatome. Pt is healing fine as far as her R hip surgery is concerned. Also has R medial knee pain due to arthritis. Had home health PT which involves seated knee extension, hip flexion, standing hip abduction, extension. Pt did some walking wiht a rw. Feels a lot of pain all over. Walking too far is limited due to her O2 situation.  PRECAUTIONS: SpO2 not under 90 %  SUBJECTIVE: R knee is doing much better. Also took tylenol. The walking on the gravel might have aggravated it. L shoulder still bothers her. No R knee or L shoulder pain  currently.   PAIN:  Are you having pain? See subjective       TODAY'S TREATMENT:   05/13/2022   Therapeutic exercise      At start of session:   3 L O2: SpO2 96%, HR 61  Seated hip ER   R 10x5 seconds for 2 sets  Seated hip adduction folded pillow squeeze isometrics 10x5 seconds for 3 sest   98% SpO2, HR 70s   Reclined   Hooklying   Posterior pelvic tilt 10x5 seconds for 2 sets     Then with march 10x3       Hip extension isometrics, leg straight    R 10x5 seconds for 3 sets    L 10x5 seconds for 3 sets     Therapeutic rest breaks utilized for oxygen and muscle recovery.     Improved exercise technique, movement at target joints, use of target muscles after mod verbal, visual, tactile cues.      Response to treatment Decreased R knee pain reported after session        Clinical impression  Significant decrease in R knee pain today compared to previous visit based on subjective reports. Continued working on non weight bearing trunk and glute exercises to promote lumbopelvic strength to decrease stress to low back while not placing stress to her R knee as well. Pt tolerated session well without aggravation of symptoms. Pt will benefit from continued skilled physical therapy services to improve ability to ambulate independently and on uneven surfaces, as well as improve activity tolerance and continue to decrease low back pain. Challenges to progress include bradycardia and pulmonary fibrosis.         PATIENT EDUCATION: Education details: ther-ex, HEP Person educated: Patient Education method: Explanation, Demonstration, Tactile cues, Verbal cues, and Handouts Education comprehension: verbalized understanding and returned demonstration   HOME EXERCISE PROGRAM: Access Code: [email or web address] URL: [email or web address] Date: 01/20/2022 Prepared by: Joneen Boers  Exercises - Seated Transversus Abdominis Bracing  - 1 x daily - 7 x  weekly - 3 sets - 10 reps - 5 seconds hold - Seated Gluteal Sets  - 1 x daily - 7 x weekly - 3 sets - 10 reps - 5 seconds hold - Discontinued 03/12/2022: Seated Scapular Retraction  - 1 x daily - 7 x weekly - 3 sets - 10 reps - 5 seconds  hold  Can perform aforementioned exercises in standing now.    - Seated Hip Adduction Isometrics with Ball  - 1 x daily - 7 x weekly - 2 sets - 10 reps - 5 seconds hold - Standing Hip Abduction with Counter Support  - 1 x daily - 7 x weekly -  3 sets - 10 reps - Discontinued on 03/09/2022: Seated Hip Abduction with Resistance  - 1 x daily - 7 x weekly - 2 sets - 10 reps  - Supine Posterior Pelvic Tilt  - 3 x daily - 7 x weekly - 3 sets - 10 reps - 5 seconds hold - Hooklying Clamshell with Resistance  - 1 x daily - 7 x weekly - 3 sets - 10 reps - Standing Posterior Pelvic Tilt  - 3 x daily - 7 x weekly - 3 sets - 10 reps - 5 seconds hold   PT Short Term Goals - 02/16/22 0941       PT SHORT TERM GOAL #1   Title Pt will be independent with her initial HEP to decrease pain, improve strength, function, and ability to stand and ambulate more comfortably.    Baseline No questions with HEP, has been doing them (02/16/2022)    Time 3    Period Weeks    Status Achieved    Target Date 02/05/22              PT Long Term Goals - 05/06/22 1629       PT LONG TERM GOAL #1   Title Pt will have a decrease in low back pain to 3/10 or less at worst to promote ability to stand and ambulate with less difficulty.    Baseline 6/10 low back pain at worst for the past 2 months (01/12/2022); 7/10 at most for the past 7 days (standing in the kitchen, more standing than normal becuase pt had guests over from [facility]) ( 02/16/2022);  6-7/10 back pain at worst for the past 7 days. Back pain at worst goes away faster since starting PT. (03/09/2022). R low back pain has not been bothering her. Thoracic back pain is about a 6/10 at worst for the past 7 days (04/02/2022); 4/10 low  back pain at worst for the past 7 days (05/06/2022)    Time 8    Period Weeks    Status Partially Met    Target Date 07/02/22      PT LONG TERM GOAL #2   Title Pt will have a decrease in R LE pain to 3/10 or less at worst to promote ability to stand and ambulate will less difficulty.    Baseline 9/10 R LE pain at worst (01/12/2022); 3/10 R sciatic and L5 dermatome pain at most for the past 7 days; has R > L heel pain however which wakes her up at night. (02/16/2022); 4/10 R LE pain at most for the past 7 days. R heel pain however wakes her up at night. (03/12/2022), 3/10 R LE pain at worst for the past 7 days (04/02/2022); R heel bothered her last night (possibly plantar fasciitis), 3/10 R LE pain at most for the past 7 days (05/06/2022)    Time 8    Period Weeks    Status Achieved    Target Date 05/07/22      PT LONG TERM GOAL #3   Title Pt will improve her FOTO score by at least 10 points as a demontsration of improved function.    Baseline Lumbar Spine FOTO 39 (01/12/2022); 46 (02/16/2022); 52 (03/12/2022)    Time 8    Period Weeks    Status Achieved    Target Date 05/07/22      PT LONG TERM GOAL #4   Title Pt will be able to ambulate at least 100 ft independently to  promote mobility and return to prior level of function.    Baseline Able to ambulate with rw short distance, about 40 ft with reproduction of pain. (01/12/2022); able to ambulate 120 ft with rw, SBA (02/16/2022); SPC 40 ft, CGA (03/12/2022); Pt able to ambulate 88 ft with SPC on L side (03/31/2022); 210 ft with SPC on L side, rw 200 ft, no pain (05/06/2022)    Time 8    Period Weeks    Status Partially Met    Target Date 07/02/22              Plan - 05/13/22 1635     Clinical Impression Statement Significant decrease in R knee pain today compared to previous visit based on subjective reports. Continued working on non weight bearing trunk and glute exercises to promote lumbopelvic strength to decrease stress to low back while not  placing stress to her R knee as well. Pt tolerated session well without aggravation of symptoms. Pt will benefit from continued skilled physical therapy services to improve ability to ambulate independently and on uneven surfaces, as well as improve activity tolerance and continue to decrease low back pain. Challenges to progress include bradycardia and pulmonary fibrosis.    Personal Factors and Comorbidities Comorbidity 3+;Age;Time since onset of injury/illness/exacerbation;Fitness    Comorbidities Arthritis, atrial fibrillation, HTN, idiopathic pulmonary fibrosis    Examination-Activity Limitations Stairs;Bed Mobility;Stand;Sit;Lift;Transfers;Locomotion Level;Carry    Stability/Clinical Decision Making Stable/Uncomplicated    Rehab Potential Fair    PT Frequency 2x / week    PT Duration 8 weeks    PT Treatment/Interventions Therapeutic activities;Therapeutic exercise;Gait training;Balance training;Neuromuscular re-education;Patient/family education;Manual techniques;Dry needling;Electrical Stimulation;Iontophoresis 75m/ml Dexamethasone    PT Next Visit Plan Posture, thoracic extension, trunk and glute strength, lumbopelvic control during gait, manual techniques, modalities PRN    PT Home Exercise Plan Medbridge Access Code: [email or web address]   Consulted and Agree with Plan of Care Patient                               MJoneen BoersPT, DPT  05/13/2022, 4:38 PM

## 2022-05-18 ENCOUNTER — Ambulatory Visit: Payer: Medicare PPO

## 2022-05-18 DIAGNOSIS — M5417 Radiculopathy, lumbosacral region: Secondary | ICD-10-CM

## 2022-05-18 DIAGNOSIS — R262 Difficulty in walking, not elsewhere classified: Secondary | ICD-10-CM

## 2022-05-18 DIAGNOSIS — M5459 Other low back pain: Secondary | ICD-10-CM | POA: Diagnosis not present

## 2022-05-18 DIAGNOSIS — Z9181 History of falling: Secondary | ICD-10-CM

## 2022-05-18 DIAGNOSIS — M5431 Sciatica, right side: Secondary | ICD-10-CM

## 2022-05-18 DIAGNOSIS — M6281 Muscle weakness (generalized): Secondary | ICD-10-CM

## 2022-05-18 NOTE — Therapy (Signed)
OUTPATIENT PHYSICAL THERAPY TREATMENT NOTE      Patient Name: Joyce Evans MRN: [id number] DOB:[birth year], 81 y.o., female Today's Date: 05/18/2022  PCP: Derinda Late, MD  REFERRING PROVIDER: Leonie Green, NP   PT End of Session - 05/18/22 0802     Visit Number 29    Number of Visits 22    Date for PT Re-Evaluation 07/02/22    Authorization Type [employer]    Progress Note Due on Visit 10    PT Start Time 0803    PT Stop Time 4098    PT Time Calculation (min) 41 min    Activity Tolerance Patient tolerated treatment well;No increased pain    Behavior During Therapy WFL for tasks assessed/performed                                    Past Medical History:  Diagnosis Date   Actinic keratosis    Arthritis    osteo - shoulders, fingers   Atrial fibrillation (HCC)    Dizziness    thinks because of diuretic   Family history of adverse reaction to anesthesia    daughter -PONV   Gallstones 2016, 2017   GERD (gastroesophageal reflux disease)    Glaucoma    Heart murmur    history of   Hypertension    Melanoma ([place]) 04/20/2016   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 Excised: 05/19/2016, margins free.   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   Tricuspid valve disorder    leak   Past Surgical History:  Procedure Laterality Date   ABDOMINAL HYSTERECTOMY  1987   partial   CARDIAC CATHETERIZATION  2010   [facility]   CATARACT EXTRACTION W/PHACO Right 08/28/2015   Procedure: CATARACT EXTRACTION PHACO AND INTRAOCULAR LENS PLACEMENT ([place]);  Surgeon: Leandrew Koyanagi, MD;  Location: [facility];  Service: Ophthalmology;  Laterality: Right;  TORIC   INTRAMEDULLARY (IM) NAIL INTERTROCHANTERIC Right 10/16/2021   Procedure: INTRAMEDULLARY (IM) NAIL INTERTROCHANTRIC;  Surgeon: Renee Harder, MD;  Location: [facility];  Service: Orthopedics;  Laterality: Right;   MELANOMA EXCISION Left 05/19/2016   MIS. Left mid. lat. tricep. 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 S/P IM nail placement for right intertrochanteric hip fracture with Dr. Sharlet Salina on 10/16/2021. Pt slipped on the kithen floor. Pt was getting something from the microwave and pt tripped on her O2 tube. Pt was independent with ambulation prior to her fall. Pt fell before in the woods and pt tripped over a root 3 years ago (no injuries per pt). Pt has been using supplemental O2 for the past 2 years in November. Pt has interstitial lung disease (idiopathic pulmonary  fibrosis). Recent breathing test is stable. Back pain began when pt was in Rehab in [place]. Feels like it is sciatical. Pt radiates along the sciatic nerve and R L 5 dermatome. Pt is healing fine as far as her R hip surgery is concerned. Also has R medial knee pain due to arthritis. Had home health PT which involves seated knee extension, hip flexion, standing hip abduction, extension. Pt did some walking wiht a rw. Feels a lot of pain all over. Walking too far is limited due to her O2 situation.  PRECAUTIONS: SpO2 not under 90 %  SUBJECTIVE: Back is ok, no pain. R heel throbs and wakes her up in the morning around 4:30 am. Rubs it and feels better. Currently feels it in her arch when she turns her foot in (ankle IV). Has been doing her  recumbent bike everyday.   PAIN:  Are you having pain? See subjective       TODAY'S TREATMENT:   05/18/2022   Therapeutic exercise    At start of session:   3 L O2: SpO2 98%, HR 62  Seated B ankle DF/PF 10x3  Standing B heel toe raises with B UE assist 10x3  Decreased R plantar arch pain reported.   SpO2 95%, HR 60   Gait with SPC on L side, CGA 105 ft, SpO2 90% HR 79, improved to 98 % and HR 67  100 ft (88%, HR 73; improves to 96 %, HR 61)  100 ft    (90% , HR  74; improves to 98% HR 61)   Forward step up onto 4 inch step with B UE assist                 R 5x3                L 5x3 95%, HR 72 after first 2 sets, (90 % after 3rd set), improves to 97%, HR 65    Therapeutic rest breaks utilized for oxygen and muscle recovery.     Improved exercise technique, movement at target joints, use of target muscles after mod verbal, visual, tactile cues.      Response to treatment Decreased R knee pain reported after session        Clinical impression Continued working on ambulation with SPC as well as with closed chain LE strengthening (step ups) to decrease difficulty with locomotion and curb negotiation. Worked on concentric gastroc/soleus muscle contraction to promote proper stress to R Achilles to help decrease heel pain. Decreased R heel/foot pain reported after treatment. Pt will benefit from continued skilled physical therapy services to improve ability to ambulate independently and on uneven surfaces, as well as improve activity tolerance and continue to decrease low back pain. Challenges to progress include bradycardia and pulmonary fibrosis.         PATIENT EDUCATION: Education details: ther-ex, HEP Person educated: Patient Education method: Explanation, Demonstration, Tactile cues, Verbal cues, and Handouts Education comprehension: verbalized understanding and returned demonstration   HOME EXERCISE PROGRAM: Access Code: [email or web address] URL:  [email or web address] Date: 01/20/2022 Prepared by: Joneen Boers  Exercises - Seated Transversus Abdominis Bracing  - 1 x daily - 7 x weekly - 3 sets - 10 reps - 5 seconds hold - Seated Gluteal Sets  - 1 x daily - 7 x weekly - 3 sets - 10 reps - 5 seconds hold - Discontinued 03/12/2022: Seated Scapular Retraction  - 1 x daily - 7 x weekly - 3 sets - 10  reps - 5 seconds  hold  Can perform aforementioned exercises in standing now.    - Seated Hip Adduction Isometrics with Ball  - 1 x daily - 7 x weekly - 2 sets - 10 reps - 5 seconds hold - Standing Hip Abduction with Counter Support  - 1 x daily - 7 x weekly - 3 sets - 10 reps - Discontinued on 03/09/2022: Seated Hip Abduction with Resistance  - 1 x daily - 7 x weekly - 2 sets - 10 reps  - Supine Posterior Pelvic Tilt  - 3 x daily - 7 x weekly - 3 sets - 10 reps - 5 seconds hold - Hooklying Clamshell with Resistance  - 1 x daily - 7 x weekly - 3 sets - 10 reps - Standing Posterior Pelvic Tilt  - 3 x daily - 7 x weekly - 3 sets - 10 reps - 5 seconds hold   PT Short Term Goals - 02/16/22 0941       PT SHORT TERM GOAL #1   Title Pt will be independent with her initial HEP to decrease pain, improve strength, function, and ability to stand and ambulate more comfortably.    Baseline No questions with HEP, has been doing them (02/16/2022)    Time 3    Period Weeks    Status Achieved    Target Date 02/05/22              PT Long Term Goals - 05/06/22 1629       PT LONG TERM GOAL #1   Title Pt will have a decrease in low back pain to 3/10 or less at worst to promote ability to stand and ambulate with less difficulty.    Baseline 6/10 low back pain at worst for the past 2 months (01/12/2022); 7/10 at most for the past 7 days (standing in the kitchen, more standing than normal becuase pt had guests over from [facility]) ( 02/16/2022);  6-7/10 back pain at worst for the past 7 days. Back pain at worst goes away faster since  starting PT. (03/09/2022). R low back pain has not been bothering her. Thoracic back pain is about a 6/10 at worst for the past 7 days (04/02/2022); 4/10 low back pain at worst for the past 7 days (05/06/2022)    Time 8    Period Weeks    Status Partially Met    Target Date 07/02/22      PT LONG TERM GOAL #2   Title Pt will have a decrease in R LE pain to 3/10 or less at worst to promote ability to stand and ambulate will less difficulty.    Baseline 9/10 R LE pain at worst (01/12/2022); 3/10 R sciatic and L5 dermatome pain at most for the past 7 days; has R > L heel pain however which wakes her up at night. (02/16/2022); 4/10 R LE pain at most for the past 7 days. R heel pain however wakes her up at night. (03/12/2022), 3/10 R LE pain at worst for the past 7 days (04/02/2022); R heel bothered her last night (possibly plantar fasciitis), 3/10 R LE pain at most for the past 7 days (05/06/2022)    Time 8    Period Weeks    Status Achieved    Target Date 05/07/22      PT LONG TERM GOAL #3   Title Pt will improve her FOTO score by at least 10 points as a demontsration of improved  function.    Baseline Lumbar Spine FOTO 39 (01/12/2022); 46 (02/16/2022); 52 (03/12/2022)    Time 8    Period Weeks    Status Achieved    Target Date 05/07/22      PT LONG TERM GOAL #4   Title Pt will be able to ambulate at least 100 ft independently to promote mobility and return to prior level of function.    Baseline Able to ambulate with rw short distance, about 40 ft with reproduction of pain. (01/12/2022); able to ambulate 120 ft with rw, SBA (02/16/2022); SPC 40 ft, CGA (03/12/2022); Pt able to ambulate 88 ft with SPC on L side (03/31/2022); 210 ft with SPC on L side, rw 200 ft, no pain (05/06/2022)    Time 8    Period Weeks    Status Partially Met    Target Date 07/02/22              Plan - 05/18/22 0802     Clinical Impression Statement Continued working on ambulation with SPC as well as with closed chain LE  strengthening (step ups) to decrease difficulty with locomotion and curb negotiation. Worked on concentric gastroc/soleus muscle contraction to promote proper stress to R Achilles to help decrease heel pain. Decreased R heel/foot pain reported after treatment. Pt will benefit from continued skilled physical therapy services to improve ability to ambulate independently and on uneven surfaces, as well as improve activity tolerance and continue to decrease low back pain. Challenges to progress include bradycardia and pulmonary fibrosis.    Personal Factors and Comorbidities Comorbidity 3+;Age;Time since onset of injury/illness/exacerbation;Fitness    Comorbidities Arthritis, atrial fibrillation, HTN, idiopathic pulmonary fibrosis    Examination-Activity Limitations Stairs;Bed Mobility;Stand;Sit;Lift;Transfers;Locomotion Level;Carry    Stability/Clinical Decision Making Stable/Uncomplicated    Clinical Decision Making Low    Rehab Potential Fair    PT Frequency 2x / week    PT Duration 8 weeks    PT Treatment/Interventions Therapeutic activities;Therapeutic exercise;Gait training;Balance training;Neuromuscular re-education;Patient/family education;Manual techniques;Dry needling;Electrical Stimulation;Iontophoresis 24m/ml Dexamethasone    PT Next Visit Plan Posture, thoracic extension, trunk and glute strength, lumbopelvic control during gait, manual techniques, modalities PRN    PT Home Exercise Plan Medbridge Access Code: [email or web address]   Consulted and Agree with Plan of Care Patient                                MJoneen BoersPT, DPT  05/18/2022, 10:38 AM

## 2022-05-20 ENCOUNTER — Ambulatory Visit: Payer: Medicare PPO

## 2022-05-20 DIAGNOSIS — M5459 Other low back pain: Secondary | ICD-10-CM | POA: Diagnosis not present

## 2022-05-20 DIAGNOSIS — R262 Difficulty in walking, not elsewhere classified: Secondary | ICD-10-CM

## 2022-05-20 DIAGNOSIS — M5417 Radiculopathy, lumbosacral region: Secondary | ICD-10-CM

## 2022-05-20 DIAGNOSIS — M5431 Sciatica, right side: Secondary | ICD-10-CM

## 2022-05-20 DIAGNOSIS — M6281 Muscle weakness (generalized): Secondary | ICD-10-CM

## 2022-05-20 DIAGNOSIS — Z9181 History of falling: Secondary | ICD-10-CM

## 2022-05-20 NOTE — Therapy (Signed)
OUTPATIENT PHYSICAL THERAPY TREATMENT NOTE And Progress Report (04/02/2022 - 05/20/2022)      Patient Name: Joyce Evans MRN: [id number] DOB:1940/10/12, 81 y.o., female Today's Date: 05/20/2022  PCP: Derinda Late, MD  REFERRING PROVIDER: Leonie Green, NP   PT End of Session - 05/20/22 0539     Visit Number 30    Number of Visits 53    Date for PT Re-Evaluation 07/02/22    Authorization Type [employer]    Progress Note Due on Visit 10    PT Start Time 0922    PT Stop Time 1001    PT Time Calculation (min) 39 min    Activity Tolerance Patient tolerated treatment well;No increased pain    Behavior During Therapy WFL for tasks assessed/performed                                     Past Medical History:  Diagnosis Date   Actinic keratosis    Arthritis    osteo - shoulders, fingers   Atrial fibrillation (HCC)    Dizziness    thinks because of diuretic   Family history of adverse reaction to anesthesia    daughter -PONV   Gallstones 2016, 2017   GERD (gastroesophageal reflux disease)    Glaucoma    Heart murmur    history of   Hypertension    Melanoma ([place]) 04/20/2016   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 Excised: 05/19/2016, margins free.   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   Tricuspid valve disorder    leak   Past Surgical History:  Procedure Laterality Date   ABDOMINAL HYSTERECTOMY  1987   partial   CARDIAC CATHETERIZATION  2010   [facility]   CATARACT EXTRACTION W/PHACO Right 08/28/2015   Procedure: CATARACT EXTRACTION PHACO AND INTRAOCULAR LENS PLACEMENT ([place]);  Surgeon: Leandrew Koyanagi, MD;  Location: [facility];  Service: Ophthalmology;  Laterality: Right;  TORIC   INTRAMEDULLARY (IM) NAIL INTERTROCHANTERIC Right 10/16/2021   Procedure: INTRAMEDULLARY  (IM) NAIL INTERTROCHANTRIC;  Surgeon: Renee Harder, MD;  Location: [facility];  Service: Orthopedics;  Laterality: Right;   MELANOMA EXCISION Left 05/19/2016   MIS. Left mid. lat. tricep. 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 S/P IM nail placement for right intertrochanteric hip fracture with Dr. Sharlet Salina on 10/16/2021. Pt slipped on the kithen floor. Pt was getting something from the microwave and pt tripped on her O2 tube. Pt was independent with ambulation prior to her fall. Pt fell before in the woods and pt tripped over a root 3 years ago (no injuries per pt). Pt has been using supplemental O2 for the past 2 years in November.  Pt has interstitial lung disease (idiopathic pulmonary fibrosis). Recent breathing test is stable. Back pain began when pt was in Rehab in [place] [facility]. Feels like it is sciatical. Pt radiates along the sciatic nerve and R L 5 dermatome. Pt is healing fine as far as her R hip surgery is concerned. Also has R medial knee pain due to arthritis. Had home health PT which involves seated knee extension, hip flexion, standing hip abduction, extension. Pt did some walking wiht a rw. Feels a lot of pain all over. Walking too far is limited due to her O2 situation.  PRECAUTIONS: SpO2 not under 90 %  SUBJECTIVE: Used a rollator to the basketball game which worked fine. No back pain currently. Feels like she uses less energy when not using and AD such as a SPC. R  heel did much better last night.   PAIN:  Are you having pain? See subjective       TODAY'S TREATMENT:   05/20/2022   Therapeutic exercise    At start of session:   3 L O2: SpO2 98%, HR 72  Gait with SPC on L side 200 ft SBA to CGA  SpO2 89%, HR 90 (improves to 97%, HR 77  after rest)   Gait without AD 100 ft, CGA  89-90% SpO2, improves to 98%, HR 70 after rest  A little R knee discomfort, not much. Has not yet had Tylenol per pt.    Then without AD another 100 ft.    91%, HR 75, improves to   Standing with B UE assist   Hip abduction    R with 2 lbs ankle weight 10x    Then with yellow band around ankles     R 10x3 (96%, HR 66)   Side stepping 5 ft to the L and 5 ft to the R with yellow band around ankles   R knee pain     Therapeutic rest breaks utilized for oxygen and muscle recovery.     Improved exercise technique, movement at target joints, use of target muscles after mod verbal, visual, tactile cues.      Response to treatment Pt tolerated session well without aggravation of back pain. Improved overall activity/gait tolerance observed.         Clinical impression Improved ability to ambulate with less need for AD with pt able to ambulate 100 ft 2x today without AD, no LOB and SpO2 did not go lower thatn 89% on 3 L O2, no back pain, just slight R knee pain. Back pain at worst ranges to 4-5/10 for the past 7 days in which core and hip weakness and endurance seem to play a factor. Overall, pt making progress with PT towards goals. Pt will benefit from continued skilled physical therapy services to improve ability to ambulate independently and on uneven surfaces, as well as improve activity tolerance and continue to decrease low back pain. Challenges to progress include bradycardia and pulmonary fibrosis.         PATIENT EDUCATION: Education details: ther-ex, HEP Person educated: Patient Education method: Explanation, Demonstration, Tactile  cues, Verbal cues, and Handouts Education comprehension: verbalized understanding and returned demonstration   HOME EXERCISE PROGRAM: Access Code: [email or web address] URL: [email or web address] Date: 01/20/2022 Prepared by: Joneen Boers  Exercises - Seated Transversus Abdominis Bracing  - 1 x daily - 7 x weekly - 3 sets - 10 reps - 5 seconds hold - Seated Gluteal Sets  - 1 x daily - 7 x weekly - 3  sets - 10 reps - 5 seconds hold - Discontinued 03/12/2022: Seated Scapular Retraction  - 1 x daily - 7 x weekly - 3 sets - 10 reps - 5 seconds  hold  Can perform aforementioned exercises in standing now.    - Seated Hip Adduction Isometrics with Ball  - 1 x daily - 7 x weekly - 2 sets - 10 reps - 5 seconds hold - Standing Hip Abduction with Counter Support  - 1 x daily - 7 x weekly - 3 sets - 10 reps - Discontinued on 03/09/2022: Seated Hip Abduction with Resistance  - 1 x daily - 7 x weekly - 2 sets - 10 reps  - Supine Posterior Pelvic Tilt  - 3 x daily - 7 x weekly - 3 sets - 10 reps - 5 seconds hold - Hooklying Clamshell with Resistance  - 1 x daily - 7 x weekly - 3 sets - 10 reps - Standing Posterior Pelvic Tilt  - 3 x daily - 7 x weekly - 3 sets - 10 reps - 5 seconds hold   PT Short Term Goals - 02/16/22 0941       PT SHORT TERM GOAL #1   Title Pt will be independent with her initial HEP to decrease pain, improve strength, function, and ability to stand and ambulate more comfortably.    Baseline No questions with HEP, has been doing them (02/16/2022)    Time 3    Period Weeks    Status Achieved    Target Date 02/05/22              PT Long Term Goals - 05/20/22 0926       PT LONG TERM GOAL #1   Title Pt will have a decrease in low back pain to 3/10 or less at worst to promote ability to stand and ambulate with less difficulty.    Baseline 6/10 low back pain at worst for the past 2 months (01/12/2022); 7/10 at most for the past 7 days (standing in the kitchen, more standing  than normal becuase pt had guests over from [facility]) ( 02/16/2022);  6-7/10 back pain at worst for the past 7 days. Back pain at worst goes away faster since starting PT. (03/09/2022). R low back pain has not been bothering her. Thoracic back pain is about a 6/10 at worst for the past 7 days (04/02/2022); 4/10 low back pain at worst for the past 7 days (05/06/2022); 4-5/10 at worst for the past 7 days, comes and goes (05/20/2022)    Time 8    Period Weeks    Status Partially Met    Target Date 07/02/22      PT LONG TERM GOAL #2   Title Pt will have a decrease in R LE pain to 3/10 or less at worst to promote ability to stand and ambulate will less difficulty.    Baseline 9/10 R LE pain at worst (01/12/2022); 3/10 R sciatic and L5 dermatome pain at most for the past 7 days; has R > L heel pain however which wakes her up at night. (02/16/2022); 4/10 R LE pain at most for the past 7 days. R heel pain however wakes her up at night. (03/12/2022), 3/10 R LE pain at worst for the past 7 days (04/02/2022); R heel bothered her last night (possibly plantar fasciitis), 3/10 R LE pain at most for the past 7 days (05/06/2022)    Time 8    Period Weeks  Status Achieved    Target Date 05/07/22      PT LONG TERM GOAL #3   Title Pt will improve her FOTO score by at least 10 points as a demontsration of improved function.    Baseline Lumbar Spine FOTO 39 (01/12/2022); 46 (02/16/2022); 52 (03/12/2022)    Time 8    Period Weeks    Status Achieved    Target Date 05/07/22      PT LONG TERM GOAL #4   Title Pt will be able to ambulate at least 100 ft independently to promote mobility and return to prior level of function.    Baseline Able to ambulate with rw short distance, about 40 ft with reproduction of pain. (01/12/2022); able to ambulate 120 ft with rw, SBA (02/16/2022); SPC 40 ft, CGA (03/12/2022); Pt able to ambulate 88 ft with SPC on L side (03/31/2022); 210 ft with SPC on L side, rw 200 ft, no pain (05/06/2022); SPC on L  side 200 ft, then no AD but CGA 100 ft x 2 with rest break in between bouts, no back pain reported (05/20/2022)    Time 8    Period Weeks    Status Partially Met    Target Date 07/02/22              Plan - 05/20/22 0921     Clinical Impression Statement Improved ability to ambulate with less need for AD with pt able to ambulate 100 ft 2x today without AD, no LOB and SpO2 did not go lower thatn 89% on 3 L O2, no back pain, just slight R knee pain. Back pain at worst ranges to 4-5/10 for the past 7 days in which core and hip weakness and endurance seem to play a factor. Overall, pt making progress with PT towards goals. Pt will benefit from continued skilled physical therapy services to improve ability to ambulate independently and on uneven surfaces, as well as improve activity tolerance and continue to decrease low back pain. Challenges to progress include bradycardia and pulmonary fibrosis.    Personal Factors and Comorbidities Comorbidity 3+;Age;Time since onset of injury/illness/exacerbation;Fitness    Comorbidities Arthritis, atrial fibrillation, HTN, idiopathic pulmonary fibrosis    Examination-Activity Limitations Stairs;Bed Mobility;Stand;Sit;Lift;Transfers;Locomotion Level;Carry    Stability/Clinical Decision Making Stable/Uncomplicated    Clinical Decision Making Low    Rehab Potential Fair    PT Frequency 2x / week    PT Duration 8 weeks    PT Treatment/Interventions Therapeutic activities;Therapeutic exercise;Gait training;Balance training;Neuromuscular re-education;Patient/family education;Manual techniques;Dry needling;Electrical Stimulation;Iontophoresis 48m/ml Dexamethasone    PT Next Visit Plan Posture, thoracic extension, trunk and glute strength, lumbopelvic control during gait, manual techniques, modalities PRN    PT Home Exercise Plan Medbridge Access Code: [email or web address]   Consulted and Agree with Plan of Care Patient                                 Thank you for your referral.  MJoneen BoersPT, DPT  05/20/2022, 1:10 PM

## 2022-05-25 ENCOUNTER — Ambulatory Visit: Payer: Medicare PPO

## 2022-05-25 DIAGNOSIS — R262 Difficulty in walking, not elsewhere classified: Secondary | ICD-10-CM

## 2022-05-25 DIAGNOSIS — M5459 Other low back pain: Secondary | ICD-10-CM | POA: Diagnosis not present

## 2022-05-25 DIAGNOSIS — M5431 Sciatica, right side: Secondary | ICD-10-CM

## 2022-05-25 DIAGNOSIS — M6281 Muscle weakness (generalized): Secondary | ICD-10-CM

## 2022-05-25 DIAGNOSIS — Z9181 History of falling: Secondary | ICD-10-CM

## 2022-05-25 DIAGNOSIS — M5417 Radiculopathy, lumbosacral region: Secondary | ICD-10-CM

## 2022-05-25 NOTE — Therapy (Signed)
OUTPATIENT PHYSICAL THERAPY TREATMENT NOTE        Patient Name: Joyce Evans MRN: [id number] DOB:[birth year], 81 y.o., female Today's Date: 05/25/2022  PCP: Derinda Late, MD  REFERRING PROVIDER: Leonie Green, NP   PT End of Session - 05/25/22 1706     Visit Number 31    Number of Visits 91    Date for PT Re-Evaluation 07/02/22    Authorization Type [employer]    Progress Note Due on Visit 10    PT Start Time 2505    PT Stop Time 3976    PT Time Calculation (min) 41 min    Activity Tolerance Patient tolerated treatment well;No increased pain    Behavior During Therapy WFL for tasks assessed/performed                                      Past Medical History:  Diagnosis Date   Actinic keratosis    Arthritis    osteo - shoulders, fingers   Atrial fibrillation (HCC)    Dizziness    thinks because of diuretic   Family history of adverse reaction to anesthesia    daughter -PONV   Gallstones 2016, 2017   GERD (gastroesophageal reflux disease)    Glaucoma    Heart murmur    history of   Hypertension    Melanoma ([place]) 04/20/2016   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 Excised: 05/19/2016, margins free.   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   Tricuspid valve disorder    leak   Past Surgical History:  Procedure Laterality Date   ABDOMINAL HYSTERECTOMY  1987   partial   CARDIAC CATHETERIZATION  2010   [facility]   CATARACT EXTRACTION W/PHACO Right 08/28/2015   Procedure: CATARACT EXTRACTION PHACO AND INTRAOCULAR LENS PLACEMENT ([place]);  Surgeon: Leandrew Koyanagi, MD;  Location: [place];  Service: Ophthalmology;  Laterality: Right;  TORIC   INTRAMEDULLARY (IM) NAIL INTERTROCHANTERIC Right 10/16/2021   Procedure: INTRAMEDULLARY (IM) NAIL INTERTROCHANTRIC;  Surgeon:  Renee Harder, MD;  Location: [facility];  Service: Orthopedics;  Laterality: Right;   MELANOMA EXCISION Left 05/19/2016   MIS. Left mid. lat. tricep. 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 S/P IM nail placement for right intertrochanteric hip fracture with Dr. Sharlet Salina on 10/16/2021. Pt slipped on the kithen floor. Pt was getting something from the microwave and pt tripped on her O2 tube. Pt was independent with ambulation prior to her fall. Pt fell before in the woods and pt tripped over a root 3 years ago (no injuries per pt). Pt has been using supplemental O2 for the past 2 years in November. Pt has interstitial  lung disease (idiopathic pulmonary fibrosis). Recent breathing test is stable. Back pain began when pt was in Rehab in [place] [facility]. Feels like it is sciatical. Pt radiates along the sciatic nerve and R L 5 dermatome. Pt is healing fine as far as her R hip surgery is concerned. Also has R medial knee pain due to arthritis. Had home health PT which involves seated knee extension, hip flexion, standing hip abduction, extension. Pt did some walking wiht a rw. Feels a lot of pain all over. Walking too far is limited due to her O2 situation.  PRECAUTIONS: SpO2 not under 90 %  SUBJECTIVE: Back is good. Visited her friends and used her rollator. Did not use her O2 at times when sitting and oxygen was 94% to 96% in sitting for about 15 minutes.   PAIN:  Are you having pain?  See subjective       TODAY'S TREATMENT:   05/25/2022   Therapeutic exercise    At start of session:   3 L O2: SpO2 99%, HR 66  Gait with SPC on L side 200 ft SBA to CGA  SpO2 88%, HR 93 (improves to 98%, HR 77  after rest)    Standing with B UE assist    Forward step up onto 4 inch step 5x3 each LE alternating    93%, HR 100 first set (improves to 98%  with rest)    91%, HR 115 after seconds set (99%, HR 70 with rest)    95%, HR 106    Hip abduction     with yellow band around ankles     R 10x3 (99%, HR 101)   Side stepping 5 ft to the L and 5 ft to the R with yellow band around ankles 3x   R knee pain  86%, HR 101 (improves to 96%, HR 83)  Seated R hip ER 10x3  Seated hip adduction small green physioball squeeze 10x5 seconds for 3 sets    Therapeutic rest breaks utilized for oxygen and muscle recovery.      Improved exercise technique, movement at target joints, use of target muscles after mod verbal, visual, tactile cues.      Response to treatment Pt tolerated session well without aggravation of back pain.        Clinical impression  Continued working on improving ability to ambulate longer distances with least restrictive AD such as a SPC. Continues to be able to ambulate 200 ft with SPC CGA to SBA. Continued working on glute med muscle strengthening to decrease pelvic drop while ambulating to decrease stress to low back. Pt tolerated session well without aggravation of symptoms. R knee pain during closed chain exercises, eases with sitting rest. Pt will benefit from continued skilled physical therapy services to improve ability to ambulate independently and on uneven surfaces, as well as improve activity tolerance and continue to decrease low back pain. Challenges to progress include bradycardia and pulmonary fibrosis.         PATIENT EDUCATION: Education details: ther-ex, HEP Person educated: Patient Education method: Explanation,  Demonstration, Tactile cues, Verbal cues, and Handouts Education comprehension: verbalized understanding and returned demonstration   HOME EXERCISE PROGRAM: Access Code: [email or web address] URL: [email or web address] Date: 01/20/2022 Prepared by: Joneen Boers  Exercises - Seated Transversus Abdominis Bracing  - 1 x daily - 7 x weekly - 3 sets - 10 reps - 5 seconds hold - Seated Gluteal Sets  - 1 x daily - 7 x weekly - 3 sets -  10 reps - 5 seconds hold - Discontinued 03/12/2022: Seated Scapular Retraction  - 1 x daily - 7 x weekly - 3 sets - 10 reps - 5 seconds  hold  Can perform aforementioned exercises in standing now.    - Seated Hip Adduction Isometrics with Ball  - 1 x daily - 7 x weekly - 2 sets - 10 reps - 5 seconds hold - Standing Hip Abduction with Counter Support  - 1 x daily - 7 x weekly - 3 sets - 10 reps - Discontinued on 03/09/2022: Seated Hip Abduction with Resistance  - 1 x daily - 7 x weekly - 2 sets - 10 reps  - Supine Posterior Pelvic Tilt  - 3 x daily - 7 x weekly - 3 sets - 10 reps - 5 seconds hold - Hooklying Clamshell with Resistance  - 1 x daily - 7 x weekly - 3 sets - 10 reps - Standing Posterior Pelvic Tilt  - 3 x daily - 7 x weekly - 3 sets - 10 reps - 5 seconds hold   PT Short Term Goals - 02/16/22 0941       PT SHORT TERM GOAL #1   Title Pt will be independent with her initial HEP to decrease pain, improve strength, function, and ability to stand and ambulate more comfortably.    Baseline No questions with HEP, has been doing them (02/16/2022)    Time 3    Period Weeks    Status Achieved    Target Date 02/05/22              PT Long Term Goals - 05/20/22 0926       PT LONG TERM GOAL #1   Title Pt will have a decrease in low back pain to 3/10 or less at worst to promote ability to stand and ambulate with less difficulty.    Baseline 6/10 low back pain at worst for the past 2 months (01/12/2022); 7/10 at most for the past 7 days (standing in the  kitchen, more standing than normal becuase pt had guests over from [facility]) ( 02/16/2022);  6-7/10 back pain at worst for the past 7 days. Back pain at worst goes away faster since starting PT. (03/09/2022). R low back pain has not been bothering her. Thoracic back pain is about a 6/10 at worst for the past 7 days (04/02/2022); 4/10 low back pain at worst for the past 7 days (05/06/2022); 4-5/10 at worst for the past 7 days, comes and goes (05/20/2022)    Time 8    Period Weeks    Status Partially Met    Target Date 07/02/22      PT LONG TERM GOAL #2   Title Pt will have a decrease in R LE pain to 3/10 or less at worst to promote ability to stand and ambulate will less difficulty.    Baseline 9/10 R LE pain at worst (01/12/2022); 3/10 R sciatic and L5 dermatome pain at most for the past 7 days; has R > L heel pain however which wakes her up at night. (02/16/2022); 4/10 R LE pain at most for the past 7 days. R heel pain however wakes her up at night. (03/12/2022), 3/10 R LE pain at worst for the past 7 days (04/02/2022); R heel bothered her last night (possibly plantar fasciitis), 3/10 R LE pain at most for the past 7 days (05/06/2022)    Time 8    Period Weeks  Status Achieved    Target Date 05/07/22      PT LONG TERM GOAL #3   Title Pt will improve her FOTO score by at least 10 points as a demontsration of improved function.    Baseline Lumbar Spine FOTO 39 (01/12/2022); 46 (02/16/2022); 52 (03/12/2022)    Time 8    Period Weeks    Status Achieved    Target Date 05/07/22      PT LONG TERM GOAL #4   Title Pt will be able to ambulate at least 100 ft independently to promote mobility and return to prior level of function.    Baseline Able to ambulate with rw short distance, about 40 ft with reproduction of pain. (01/12/2022); able to ambulate 120 ft with rw, SBA (02/16/2022); SPC 40 ft, CGA (03/12/2022); Pt able to ambulate 88 ft with SPC on L side (03/31/2022); 210 ft with SPC on L side, rw 200 ft, no pain  (05/06/2022); SPC on L side 200 ft, then no AD but CGA 100 ft x 2 with rest break in between bouts, no back pain reported (05/20/2022)    Time 8    Period Weeks    Status Partially Met    Target Date 07/02/22              Plan - 05/25/22 1705     Clinical Impression Statement Continued working on improving ability to ambulate longer distances with least restrictive AD such as a SPC. Continues to be able to ambulate 200 ft with SPC CGA to SBA. Continued working on glute med muscle strengthening to decrease pelvic drop while ambulating to decrease stress to low back. Pt tolerated session well without aggravation of symptoms. R knee pain during closed chain exercises, eases with sitting rest. Pt will benefit from continued skilled physical therapy services to improve ability to ambulate independently and on uneven surfaces, as well as improve activity tolerance and continue to decrease low back pain. Challenges to progress include bradycardia and pulmonary fibrosis.    Personal Factors and Comorbidities Comorbidity 3+;Age;Time since onset of injury/illness/exacerbation;Fitness    Comorbidities Arthritis, atrial fibrillation, HTN, idiopathic pulmonary fibrosis    Examination-Activity Limitations Stairs;Bed Mobility;Stand;Sit;Lift;Transfers;Locomotion Level;Carry    Stability/Clinical Decision Making Stable/Uncomplicated    Rehab Potential Fair    PT Frequency 2x / week    PT Duration 8 weeks    PT Treatment/Interventions Therapeutic activities;Therapeutic exercise;Gait training;Balance training;Neuromuscular re-education;Patient/family education;Manual techniques;Dry needling;Electrical Stimulation;Iontophoresis 57m/ml Dexamethasone    PT Next Visit Plan Posture, thoracic extension, trunk and glute strength, lumbopelvic control during gait, manual techniques, modalities PRN    PT Home Exercise Plan Medbridge Access Code: [email or web address]   Consulted and Agree with Plan of Care Patient                                   MJoneen BoersPT, DPT  05/25/2022, 6:11 PM

## 2022-05-27 ENCOUNTER — Ambulatory Visit: Payer: Medicare PPO

## 2022-05-27 DIAGNOSIS — Z9181 History of falling: Secondary | ICD-10-CM

## 2022-05-27 DIAGNOSIS — R262 Difficulty in walking, not elsewhere classified: Secondary | ICD-10-CM

## 2022-05-27 DIAGNOSIS — M5459 Other low back pain: Secondary | ICD-10-CM | POA: Diagnosis not present

## 2022-05-27 DIAGNOSIS — M5431 Sciatica, right side: Secondary | ICD-10-CM

## 2022-05-27 DIAGNOSIS — M6281 Muscle weakness (generalized): Secondary | ICD-10-CM

## 2022-05-27 DIAGNOSIS — M5417 Radiculopathy, lumbosacral region: Secondary | ICD-10-CM

## 2022-05-27 NOTE — Therapy (Signed)
OUTPATIENT PHYSICAL THERAPY TREATMENT NOTE        Patient Name: Joyce Evans MRN: [id number] DOB:[birth year], 81 y.o., female Today's Date: 05/27/2022  PCP: Derinda Late, MD  REFERRING PROVIDER: Leonie Green, NP   PT End of Session - 05/27/22 1627     Visit Number 32    Number of Visits 10    Date for PT Re-Evaluation 07/02/22    Authorization Type [employer]    Progress Note Due on Visit 10    PT Start Time 1627    PT Stop Time 1710    PT Time Calculation (min) 43 min    Activity Tolerance Patient tolerated treatment well;No increased pain    Behavior During Therapy WFL for tasks assessed/performed                                       Past Medical History:  Diagnosis Date   Actinic keratosis    Arthritis    osteo - shoulders, fingers   Atrial fibrillation (HCC)    Dizziness    thinks because of diuretic   Family history of adverse reaction to anesthesia    daughter -PONV   Gallstones 2016, 2017   GERD (gastroesophageal reflux disease)    Glaucoma    Heart murmur    history of   Hypertension    Melanoma ([place]) 04/20/2016   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 Excised: 05/19/2016, margins free.   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   Tricuspid valve disorder    leak   Past Surgical History:  Procedure Laterality Date   ABDOMINAL HYSTERECTOMY  1987   partial   CARDIAC CATHETERIZATION  2010   [facility]   CATARACT EXTRACTION W/PHACO Right 08/28/2015   Procedure: CATARACT EXTRACTION PHACO AND INTRAOCULAR LENS PLACEMENT ([place]);  Surgeon: Leandrew Koyanagi, MD;  Location: [place];  Service: Ophthalmology;  Laterality: Right;  TORIC   INTRAMEDULLARY (IM) NAIL INTERTROCHANTERIC Right 10/16/2021   Procedure: INTRAMEDULLARY (IM) NAIL INTERTROCHANTRIC;  Surgeon:  Renee Harder, MD;  Location: [facility];  Service: Orthopedics;  Laterality: Right;   MELANOMA EXCISION Left 05/19/2016   MIS. Left mid. lat. tricep. 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 S/P IM nail placement for right intertrochanteric hip fracture with Dr. Sharlet Salina on 10/16/2021. Pt slipped on the kithen floor. Pt was getting something from the microwave and pt tripped on her O2 tube. Pt was independent with ambulation prior to her fall. Pt fell before in the woods and pt tripped over a root 3 years ago (no injuries per pt). Pt has been using supplemental O2 for the past 2 years in November. Pt has  interstitial lung disease (idiopathic pulmonary fibrosis). Recent breathing test is stable. Back pain began when pt was in Rehab in [place] [facility]. Feels like it is sciatical. Pt radiates along the sciatic nerve and R L 5 dermatome. Pt is healing fine as far as her R hip surgery is concerned. Also has R medial knee pain due to arthritis. Had home health PT which involves seated knee extension, hip flexion, standing hip abduction, extension. Pt did some walking wiht a rw. Feels a lot of pain all over. Walking too far is limited due to her O2 situation.  PRECAUTIONS: SpO2 not under 90 %  SUBJECTIVE: Back is fine. 2-3/10 at worst for the past 7 days   PAIN:  Are you having pain? See subjective       TODAY'S TREATMENT:   05/27/2022   Therapeutic exercise    At start of  session:   3 L O2: SpO2 99%, HR 52     Standing with B UE assist    Hip abduction     with yellow band around ankles     R 10x4 (99%, HR 101)    Side stepping 5 ft to the L and 5 ft to the R with yellow band around ankles 1x   R knee pain  91%, HR 73, improves to 98%, HR 62    Forward step up onto 4 inch step 5x3 each LE alternating     86%, HR 113 after first 2 sets (improves to 96% HR 82 with standing rest)     93%, HR 115 after third set (96%, HR 94 with rest)   Seated R hip ER 10x3  Seated hip adduction small green physioball squeeze 10x5 seconds for 3 sets   97%, HR 74    Gait with SPC on L side with pt now carrying her supplemental O2 R shoulder strap   100 ft CGA to SBA  SpO2 87%, HR 102 (improves to 97%, HR 69  after rest)    100 ft  CGA to SBA  85%, HR 101 (improves to 98%, HR 73  after rest)     100 ft  SBA  87%, HR 96 (improves to 97%, HR 66  after rest)        Therapeutic rest breaks utilized for oxygen and muscle recovery.      Improved exercise technique, movement at target joints, use of target muscles after mod verbal, visual, tactile cues.      Response to treatment Pt tolerated session well without aggravation of back pain.        Clinical impression    Pt making good progress with decreased low back pain based on subjective reports. Continued working on improving ability to ambulate longer distances with least restrictive AD such as a SPC. Continued working on glute med muscle strengthening to decrease pelvic drop while ambulating to decrease stress to low back. Pt tolerated session well without aggravation of back pain symptoms. Pt will benefit from continued skilled physical therapy services to improve ability to ambulate independently and on uneven surfaces, as well as improve activity tolerance and continue to decrease low back pain.        PATIENT EDUCATION: Education details: ther-ex, HEP Person educated:  Patient Education method: Explanation, Demonstration, Tactile cues, Verbal cues, and Handouts Education comprehension: verbalized understanding and returned demonstration   HOME EXERCISE PROGRAM: Access Code: [email or web address] URL: [email or web address] Date: 01/20/2022 Prepared by: Joneen Boers  Exercises - Seated Transversus Abdominis Bracing  - 1  x daily - 7 x weekly - 3 sets - 10 reps - 5 seconds hold - Seated Gluteal Sets  - 1 x daily - 7 x weekly - 3 sets - 10 reps - 5 seconds hold - Discontinued 03/12/2022: Seated Scapular Retraction  - 1 x daily - 7 x weekly - 3 sets - 10 reps - 5 seconds  hold  Can perform aforementioned exercises in standing now.    - Seated Hip Adduction Isometrics with Ball  - 1 x daily - 7 x weekly - 2 sets - 10 reps - 5 seconds hold - Standing Hip Abduction with Counter Support  - 1 x daily - 7 x weekly - 3 sets - 10 reps - Discontinued on 03/09/2022: Seated Hip Abduction with Resistance  - 1 x daily - 7 x weekly - 2 sets - 10 reps  - Supine Posterior Pelvic Tilt  - 3 x daily - 7 x weekly - 3 sets - 10 reps - 5 seconds hold - Hooklying Clamshell with Resistance  - 1 x daily - 7 x weekly - 3 sets - 10 reps - Standing Posterior Pelvic Tilt  - 3 x daily - 7 x weekly - 3 sets - 10 reps - 5 seconds hold   PT Short Term Goals - 02/16/22 0941       PT SHORT TERM GOAL #1   Title Pt will be independent with her initial HEP to decrease pain, improve strength, function, and ability to stand and ambulate more comfortably.    Baseline No questions with HEP, has been doing them (02/16/2022)    Time 3    Period Weeks    Status Achieved    Target Date 02/05/22              PT Long Term Goals - 05/20/22 0926       PT LONG TERM GOAL #1   Title Pt will have a decrease in low back pain to 3/10 or less at worst to promote ability to stand and ambulate with less difficulty.    Baseline 6/10 low back pain at worst for the past 2 months (01/12/2022); 7/10 at  most for the past 7 days (standing in the kitchen, more standing than normal becuase pt had guests over from [facility]) ( 02/16/2022);  6-7/10 back pain at worst for the past 7 days. Back pain at worst goes away faster since starting PT. (03/09/2022). R low back pain has not been bothering her. Thoracic back pain is about a 6/10 at worst for the past 7 days (04/02/2022); 4/10 low back pain at worst for the past 7 days (05/06/2022); 4-5/10 at worst for the past 7 days, comes and goes (05/20/2022)    Time 8    Period Weeks    Status Partially Met    Target Date 07/02/22      PT LONG TERM GOAL #2   Title Pt will have a decrease in R LE pain to 3/10 or less at worst to promote ability to stand and ambulate will less difficulty.    Baseline 9/10 R LE pain at worst (01/12/2022); 3/10 R sciatic and L5 dermatome pain at most for the past 7 days; has R > L heel pain however which wakes her up at night. (02/16/2022); 4/10 R LE pain at most for the past 7 days. R heel pain however wakes her up at night. (03/12/2022), 3/10 R LE pain at worst for the past 7 days (04/02/2022);  R heel bothered her last night (possibly plantar fasciitis), 3/10 R LE pain at most for the past 7 days (05/06/2022)    Time 8    Period Weeks    Status Achieved    Target Date 05/07/22      PT LONG TERM GOAL #3   Title Pt will improve her FOTO score by at least 10 points as a demontsration of improved function.    Baseline Lumbar Spine FOTO 39 (01/12/2022); 46 (02/16/2022); 52 (03/12/2022)    Time 8    Period Weeks    Status Achieved    Target Date 05/07/22      PT LONG TERM GOAL #4   Title Pt will be able to ambulate at least 100 ft independently to promote mobility and return to prior level of function.    Baseline Able to ambulate with rw short distance, about 40 ft with reproduction of pain. (01/12/2022); able to ambulate 120 ft with rw, SBA (02/16/2022); SPC 40 ft, CGA (03/12/2022); Pt able to ambulate 88 ft with SPC on L side (03/31/2022); 210  ft with SPC on L side, rw 200 ft, no pain (05/06/2022); SPC on L side 200 ft, then no AD but CGA 100 ft x 2 with rest break in between bouts, no back pain reported (05/20/2022)    Time 8    Period Weeks    Status Partially Met    Target Date 07/02/22              Plan - 05/27/22 1626     Clinical Impression Statement Pt making good progress with decreased low back pain based on subjective reports. Continued working on improving ability to ambulate longer distances with least restrictive AD such as a SPC. Continued working on glute med muscle strengthening to decrease pelvic drop while ambulating to decrease stress to low back. Pt tolerated session well without aggravation of back pain symptoms. Pt will benefit from continued skilled physical therapy services to improve ability to ambulate independently and on uneven surfaces, as well as improve activity tolerance and continue to decrease low back pain.    Personal Factors and Comorbidities Comorbidity 3+;Age;Time since onset of injury/illness/exacerbation;Fitness    Comorbidities Arthritis, atrial fibrillation, HTN, idiopathic pulmonary fibrosis    Examination-Activity Limitations Stairs;Bed Mobility;Stand;Sit;Lift;Transfers;Locomotion Level;Carry    Stability/Clinical Decision Making Stable/Uncomplicated    Rehab Potential Fair    PT Frequency 2x / week    PT Duration 8 weeks    PT Treatment/Interventions Therapeutic activities;Therapeutic exercise;Gait training;Balance training;Neuromuscular re-education;Patient/family education;Manual techniques;Dry needling;Electrical Stimulation;Iontophoresis 68m/ml Dexamethasone    PT Next Visit Plan Posture, thoracic extension, trunk and glute strength, lumbopelvic control during gait, manual techniques, modalities PRN    PT Home Exercise Plan Medbridge Access Code: [email or web address]   Consulted and Agree with Plan of Care Patient                                   MJoneen BoersPT,  DPT  05/27/2022, 5:15 PM

## 2022-06-01 ENCOUNTER — Other Ambulatory Visit: Payer: Self-pay | Admitting: Family Medicine

## 2022-06-01 ENCOUNTER — Ambulatory Visit: Payer: Medicare PPO

## 2022-06-01 DIAGNOSIS — M5417 Radiculopathy, lumbosacral region: Secondary | ICD-10-CM

## 2022-06-01 DIAGNOSIS — M6281 Muscle weakness (generalized): Secondary | ICD-10-CM

## 2022-06-01 DIAGNOSIS — R262 Difficulty in walking, not elsewhere classified: Secondary | ICD-10-CM

## 2022-06-01 DIAGNOSIS — Z1231 Encounter for screening mammogram for malignant neoplasm of breast: Secondary | ICD-10-CM

## 2022-06-01 DIAGNOSIS — Z9181 History of falling: Secondary | ICD-10-CM

## 2022-06-01 DIAGNOSIS — M5459 Other low back pain: Secondary | ICD-10-CM | POA: Diagnosis not present

## 2022-06-01 DIAGNOSIS — M5431 Sciatica, right side: Secondary | ICD-10-CM

## 2022-06-01 NOTE — Therapy (Deleted)
OUTPATIENT PHYSICAL THERAPY TREATMENT NOTE        Patient Name: Joyce Evans MRN: [id number] DOB:Dec 16, 1940, 81 y.o., female Today's Date: 06/01/2022  PCP: Derinda Late, MD  REFERRING PROVIDER: Leonie Green, NP   PT End of Session - 06/01/22 1636     Visit Number 33    Number of Visits 89    Date for PT Re-Evaluation 07/02/22    Authorization Type [employer]    Progress Note Due on Visit 10    PT Start Time 1636    PT Stop Time 1716    PT Time Calculation (min) 40 min    Equipment Utilized During Treatment Gait belt    Activity Tolerance Patient tolerated treatment well;No increased pain    Behavior During Therapy WFL for tasks assessed/performed                                        Past Medical History:  Diagnosis Date   Actinic keratosis    Arthritis    osteo - shoulders, fingers   Atrial fibrillation (HCC)    Dizziness    thinks because of diuretic   Family history of adverse reaction to anesthesia    daughter -PONV   Gallstones 2016, 2017   GERD (gastroesophageal reflux disease)    Glaucoma    Heart murmur    history of   Hypertension    Melanoma ([place]) 04/20/2016   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 Excised: 05/19/2016, margins free.   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   Tricuspid valve disorder    leak   Past Surgical History:  Procedure Laterality Date   ABDOMINAL HYSTERECTOMY  1987   partial   CARDIAC CATHETERIZATION  2010   [facility]   CATARACT EXTRACTION W/PHACO Right 08/28/2015   Procedure: CATARACT EXTRACTION PHACO AND INTRAOCULAR LENS PLACEMENT ([facility]);  Surgeon: Leandrew Koyanagi, MD;  Location: [facility];  Service: Ophthalmology;  Laterality: Right;  TORIC   INTRAMEDULLARY (IM) NAIL INTERTROCHANTERIC Right 10/16/2021   Procedure:  INTRAMEDULLARY (IM) NAIL INTERTROCHANTRIC;  Surgeon: Renee Harder, MD;  Location: [facility];  Service: Orthopedics;  Laterality: Right;   MELANOMA EXCISION Left 05/19/2016   MIS. Left mid. lat. tricep. 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 S/P IM nail placement for right intertrochanteric hip fracture with Dr. Sharlet [place] on 10/16/2021. Pt slipped on the kithen floor. Pt was getting something from the microwave and pt tripped on her O2 tube. Pt was independent with ambulation prior to her fall. Pt fell before in the woods and pt tripped over a root 3 years ago (no injuries per pt). Pt has been using supplemental  O2 for the past 2 years in November. Pt has interstitial lung disease (idiopathic pulmonary fibrosis). Recent breathing test is stable. Back pain began when pt was in Rehab in [place] [facility]. Feels like it is sciatical. Pt radiates along the sciatic nerve and R L 5 dermatome. Pt is healing fine as far as her R hip surgery is concerned. Also has R medial knee pain due to arthritis. Had home health PT which involves seated knee extension, hip flexion, standing hip abduction, extension. Pt did some walking wiht a rw. Feels a lot of pain all over. Walking too far is limited due to her O2 situation.  PRECAUTIONS: SpO2 not under 90 %  SUBJECTIVE: Feels nauseous. Does not know if the medicines play a factor. Low back was bothering her a little bit this morning. Did not take Tylenol  last night. Usually takes Tylenol at night. 4-5/10 mid back (thoracolunbar area)  pain currently.   PAIN:  Are you having pain? See subjective       TODAY'S TREATMENT:   06/01/2022   Therapeutic exercise    At start of session:   Blood pressure R arm sitting, mechanically taken, normal cuff: 130/63, HR 66 SpO2 3 L 98%  Seated manually resisted R trunk lateral shift isometrics in neutral, 10x5 seconds for 2 sets  Decreased L thoracolumbar paraspinal muscle tension     Seated B scapular retraction 10x2 with 5 second holds   No thoracolumbar pain reported after aforementioned exercises.    Standing with B UE assist    Hip abduction     with yellow band around ankles     R 10x4 (94%, HR 95), improves to 98%, HR 73    Forward step up onto 4 inch step 5x3 each LE alternating     94%, HR 104 after first set    90%, HR 99 after 2nd set, improves to   94%, HR 99  after rest    91 %, HR 96 after 3rd set, improves to 96%, HR 71 after rest.    Seated R hip ER 10x3  Seated hip adduction small green physioball squeeze 10x5 seconds for 2 sets  Gait with SPC on L side with pt now carrying her supplemental O2 R shoulder strap   100 ft CGA to SBA  SpO2 91%, HR 112 (improves to 97%, HR 65  after rest)    Therapeutic rest breaks utilized for oxygen and muscle recovery.      Improved exercise technique, movement at target joints, use of target muscles after mod verbal, visual, tactile cues.      Response to treatment Pt tolerated session well without aggravation of back pain.        Clinical impression  Decreased mid back pain with treatment to improve trunk posture and thoracic extension. Continued working on glute and functional LE strength to promote ability to ambulate with less difficulty.  Pt tolerated session well without aggravation of back pain symptoms. Pt will benefit from continued skilled physical therapy services to improve ability to ambulate  independently and on uneven surfaces, as well as improve activity tolerance and continue to decrease low back pain.        PATIENT EDUCATION: Education details: ther-ex, HEP Person educated: Patient Education method: Explanation, Demonstration, Tactile cues, Verbal cues, and Handouts Education comprehension: verbalized understanding and returned demonstration   HOME EXERCISE PROGRAM: Access Code: [email or web address] URL: [email or web address] Date: 01/20/2022 Prepared by: Joneen Boers  Exercises - Seated Transversus Abdominis Bracing  -  1 x daily - 7 x weekly - 3 sets - 10 reps - 5 seconds hold - Seated Gluteal Sets  - 1 x daily - 7 x weekly - 3 sets - 10 reps - 5 seconds hold - Discontinued 03/12/2022: Seated Scapular Retraction  - 1 x daily - 7 x weekly - 3 sets - 10 reps - 5 seconds  hold  Can perform aforementioned exercises in standing now.    - Seated Hip Adduction Isometrics with Ball  - 1 x daily - 7 x weekly - 2 sets - 10 reps - 5 seconds hold - Standing Hip Abduction with Counter Support  - 1 x daily - 7 x weekly - 3 sets - 10 reps - Discontinued on 03/09/2022: Seated Hip Abduction with Resistance  - 1 x daily - 7 x weekly - 2 sets - 10 reps  - Supine Posterior Pelvic Tilt  - 3 x daily - 7 x weekly - 3 sets - 10 reps - 5 seconds hold - Hooklying Clamshell with Resistance  - 1 x daily - 7 x weekly - 3 sets - 10 reps - Standing Posterior Pelvic Tilt  - 3 x daily - 7 x weekly - 3 sets - 10 reps - 5 seconds hold   PT Short Term Goals - 02/16/22 0941       PT SHORT TERM GOAL #1   Title Pt will be independent with her initial HEP to decrease pain, improve strength, function, and ability to stand and ambulate more comfortably.    Baseline No questions with HEP, has been doing them (02/16/2022)    Time 3    Period Weeks    Status Achieved    Target Date 02/05/22              PT Long Term Goals - 05/20/22 0926       PT LONG TERM GOAL #1   Title Pt will have a  decrease in low back pain to 3/10 or less at worst to promote ability to stand and ambulate with less difficulty.    Baseline 6/10 low back pain at worst for the past 2 months (01/12/2022); 7/10 at most for the past 7 days (standing in the kitchen, more standing than normal becuase pt had guests over from [facility]) ( 02/16/2022);  6-7/10 back pain at worst for the past 7 days. Back pain at worst goes away faster since starting PT. (03/09/2022). R low back pain has not been bothering her. Thoracic back pain is about a 6/10 at worst for the past 7 days (04/02/2022); 4/10 low back pain at worst for the past 7 days (05/06/2022); 4-5/10 at worst for the past 7 days, comes and goes (05/20/2022)    Time 8    Period Weeks    Status Partially Met    Target Date 07/02/22      PT LONG TERM GOAL #2   Title Pt will have a decrease in R LE pain to 3/10 or less at worst to promote ability to stand and ambulate will less difficulty.    Baseline 9/10 R LE pain at worst (01/12/2022); 3/10 R sciatic and L5 dermatome pain at most for the past 7 days; has R > L heel pain however which wakes her up at night. (02/16/2022); 4/10 R LE pain at most for the past 7 days. R heel pain however wakes her up at night. (03/12/2022), 3/10 R LE pain at worst for the past 7 days (  04/02/2022); R heel bothered her last night (possibly plantar fasciitis), 3/10 R LE pain at most for the past 7 days (05/06/2022)    Time 8    Period Weeks    Status Achieved    Target Date 05/07/22      PT LONG TERM GOAL #3   Title Pt will improve her FOTO score by at least 10 points as a demontsration of improved function.    Baseline Lumbar Spine FOTO 39 (01/12/2022); 46 (02/16/2022); 52 (03/12/2022)    Time 8    Period Weeks    Status Achieved    Target Date 05/07/22      PT LONG TERM GOAL #4   Title Pt will be able to ambulate at least 100 ft independently to promote mobility and return to prior level of function.    Baseline Able to ambulate with rw short  distance, about 40 ft with reproduction of pain. (01/12/2022); able to ambulate 120 ft with rw, SBA (02/16/2022); SPC 40 ft, CGA (03/12/2022); Pt able to ambulate 88 ft with SPC on L side (03/31/2022); 210 ft with SPC on L side, rw 200 ft, no pain (05/06/2022); SPC on L side 200 ft, then no AD but CGA 100 ft x 2 with rest break in between bouts, no back pain reported (05/20/2022)    Time 8    Period Weeks    Status Partially Met    Target Date 07/02/22              Plan - 06/01/22 1731     Clinical Impression Statement Decreased mid back pain with treatment to improve trunk posture and thoracic extension. Continued working on glute and functional LE strength to promote ability to ambulate with less difficulty.  Pt tolerated session well without aggravation of back pain symptoms. Pt will benefit from continued skilled physical therapy services to improve ability to ambulate independently and on uneven surfaces, as well as improve activity tolerance and continue to decrease low back pain.    Personal Factors and Comorbidities Comorbidity 3+;Age;Time since onset of injury/illness/exacerbation;Fitness    Comorbidities Arthritis, atrial fibrillation, HTN, idiopathic pulmonary fibrosis    Examination-Activity Limitations Stairs;Bed Mobility;Stand;Sit;Lift;Transfers;Locomotion Level;Carry    Stability/Clinical Decision Making Stable/Uncomplicated    Clinical Decision Making Low    Rehab Potential Fair    PT Frequency 2x / week    PT Duration 8 weeks    PT Treatment/Interventions Therapeutic activities;Therapeutic exercise;Gait training;Balance training;Neuromuscular re-education;Patient/family education;Manual techniques;Dry needling;Electrical Stimulation;Iontophoresis 60m/ml Dexamethasone    PT Next Visit Plan Posture, thoracic extension, trunk and glute strength, lumbopelvic control during gait, manual techniques, modalities PRN    PT Home Exercise Plan Medbridge Access Code: [email or web address]   Consulted and  Agree with Plan of Care Patient                                    MJoneen BoersPT, DPT  06/01/2022, 5:36 PM

## 2022-06-01 NOTE — Therapy (Signed)
OUTPATIENT PHYSICAL THERAPY TREATMENT NOTE        Patient Name: Joyce Evans MRN: [id number] DOB:Dec 16, 1940, 81 y.o., female Today's Date: 06/01/2022  PCP: Derinda Late, MD  REFERRING PROVIDER: Leonie Green, NP   PT End of Session - 06/01/22 1636     Visit Number 33    Number of Visits 89    Date for PT Re-Evaluation 07/02/22    Authorization Type [employer]    Progress Note Due on Visit 10    PT Start Time 1636    PT Stop Time 1716    PT Time Calculation (min) 40 min    Equipment Utilized During Treatment Gait belt    Activity Tolerance Patient tolerated treatment well;No increased pain    Behavior During Therapy WFL for tasks assessed/performed                                        Past Medical History:  Diagnosis Date   Actinic keratosis    Arthritis    osteo - shoulders, fingers   Atrial fibrillation (HCC)    Dizziness    thinks because of diuretic   Family history of adverse reaction to anesthesia    daughter -PONV   Gallstones 2016, 2017   GERD (gastroesophageal reflux disease)    Glaucoma    Heart murmur    history of   Hypertension    Melanoma ([place]) 04/20/2016   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 Excised: 05/19/2016, margins free.   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   Tricuspid valve disorder    leak   Past Surgical History:  Procedure Laterality Date   ABDOMINAL HYSTERECTOMY  1987   partial   CARDIAC CATHETERIZATION  2010   [facility]   CATARACT EXTRACTION W/PHACO Right 08/28/2015   Procedure: CATARACT EXTRACTION PHACO AND INTRAOCULAR LENS PLACEMENT ([place]);  Surgeon: Leandrew Koyanagi, MD;  Location: [facility];  Service: Ophthalmology;  Laterality: Right;  TORIC   INTRAMEDULLARY (IM) NAIL INTERTROCHANTERIC Right 10/16/2021   Procedure:  INTRAMEDULLARY (IM) NAIL INTERTROCHANTRIC;  Surgeon: Renee Harder, MD;  Location: [facility];  Service: Orthopedics;  Laterality: Right;   MELANOMA EXCISION Left 05/19/2016   MIS. Left mid. lat. tricep. 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 S/P IM nail placement for right intertrochanteric hip fracture with Dr. Sharlet [place] on 10/16/2021. Pt slipped on the kithen floor. Pt was getting something from the microwave and pt tripped on her O2 tube. Pt was independent with ambulation prior to her fall. Pt fell before in the woods and pt tripped over a root 3 years ago (no injuries per pt). Pt has been using supplemental  O2 for the past 2 years in November. Pt has interstitial lung disease (idiopathic pulmonary fibrosis). Recent breathing test is stable. Back pain began when pt was in Rehab in [place] [facility]. Feels like it is sciatical. Pt radiates along the sciatic nerve and R L 5 dermatome. Pt is healing fine as far as her R hip surgery is concerned. Also has R medial knee pain due to arthritis. Had home health PT which involves seated knee extension, hip flexion, standing hip abduction, extension. Pt did some walking wiht a rw. Feels a lot of pain all over. Walking too far is limited due to her O2 situation.  PRECAUTIONS: SpO2 not under 90 %  SUBJECTIVE: Feels nauseous. Does not know if the medicines play a factor. Low back was bothering her a little bit this morning. Did not take Tylenol  last night. Usually takes Tylenol at night. 4-5/10 mid back (thoracolunbar area)  pain currently.   PAIN:  Are you having pain? See subjective       TODAY'S TREATMENT:   06/01/2022   Therapeutic exercise    At start of session:   Blood pressure R arm sitting, mechanically taken, normal cuff: 130/63, HR 66 SpO2 3 L 98%  Seated manually resisted R trunk lateral shift isometrics in neutral, 10x5 seconds for 2 sets  Decreased L thoracolumbar paraspinal muscle tension     Seated B scapular retraction 10x2 with 5 second holds   No thoracolumbar pain reported after aforementioned exercises.    Standing with B UE assist    Hip abduction     with yellow band around ankles     R 10x4 (94%, HR 95), improves to 98%, HR 73    Forward step up onto 4 inch step 5x3 each LE alternating     94%, HR 104 after first set    90%, HR 99 after 2nd set, improves to   94%, HR 99  after rest    91 %, HR 96 after 3rd set, improves to 96%, HR 71 after rest.    Seated R hip ER 10x3  Seated hip adduction small green physioball squeeze 10x5 seconds for 2 sets  Gait with SPC on L side with pt now carrying her supplemental O2 R shoulder strap   100 ft CGA to SBA  SpO2 91%, HR 112 (improves to 97%, HR 65  after rest)    Therapeutic rest breaks utilized for oxygen and muscle recovery.      Improved exercise technique, movement at target joints, use of target muscles after mod verbal, visual, tactile cues.      Response to treatment Pt tolerated session well without aggravation of back pain.        Clinical impression  Decreased mid back pain with treatment to improve trunk posture and thoracic extension. Continued working on glute and functional LE strength to promote ability to ambulate with less difficulty.  Pt tolerated session well without aggravation of back pain symptoms. Pt will benefit from continued skilled physical therapy services to improve ability to ambulate  independently and on uneven surfaces, as well as improve activity tolerance and continue to decrease low back pain.        PATIENT EDUCATION: Education details: ther-ex, HEP Person educated: Patient Education method: Explanation, Demonstration, Tactile cues, Verbal cues, and Handouts Education comprehension: verbalized understanding and returned demonstration   HOME EXERCISE PROGRAM: Access Code: [email or web address] URL: [email or web address] Date: 01/20/2022 Prepared by: Joneen Boers  Exercises - Seated Transversus Abdominis Bracing  -  1 x daily - 7 x weekly - 3 sets - 10 reps - 5 seconds hold - Seated Gluteal Sets  - 1 x daily - 7 x weekly - 3 sets - 10 reps - 5 seconds hold - Discontinued 03/12/2022: Seated Scapular Retraction  - 1 x daily - 7 x weekly - 3 sets - 10 reps - 5 seconds  hold  Can perform aforementioned exercises in standing now.    - Seated Hip Adduction Isometrics with Ball  - 1 x daily - 7 x weekly - 2 sets - 10 reps - 5 seconds hold - Standing Hip Abduction with Counter Support  - 1 x daily - 7 x weekly - 3 sets - 10 reps - Discontinued on 03/09/2022: Seated Hip Abduction with Resistance  - 1 x daily - 7 x weekly - 2 sets - 10 reps  - Supine Posterior Pelvic Tilt  - 3 x daily - 7 x weekly - 3 sets - 10 reps - 5 seconds hold - Hooklying Clamshell with Resistance  - 1 x daily - 7 x weekly - 3 sets - 10 reps - Standing Posterior Pelvic Tilt  - 3 x daily - 7 x weekly - 3 sets - 10 reps - 5 seconds hold   PT Short Term Goals - 02/16/22 0941       PT SHORT TERM GOAL #1   Title Pt will be independent with her initial HEP to decrease pain, improve strength, function, and ability to stand and ambulate more comfortably.    Baseline No questions with HEP, has been doing them (02/16/2022)    Time 3    Period Weeks    Status Achieved    Target Date 02/05/22              PT Long Term Goals - 05/20/22 0926       PT LONG TERM GOAL #1   Title Pt will have a  decrease in low back pain to 3/10 or less at worst to promote ability to stand and ambulate with less difficulty.    Baseline 6/10 low back pain at worst for the past 2 months (01/12/2022); 7/10 at most for the past 7 days (standing in the kitchen, more standing than normal becuase pt had guests over from [facility]) ( 02/16/2022);  6-7/10 back pain at worst for the past 7 days. Back pain at worst goes away faster since starting PT. (03/09/2022). R low back pain has not been bothering her. Thoracic back pain is about a 6/10 at worst for the past 7 days (04/02/2022); 4/10 low back pain at worst for the past 7 days (05/06/2022); 4-5/10 at worst for the past 7 days, comes and goes (05/20/2022)    Time 8    Period Weeks    Status Partially Met    Target Date 07/02/22      PT LONG TERM GOAL #2   Title Pt will have a decrease in R LE pain to 3/10 or less at worst to promote ability to stand and ambulate will less difficulty.    Baseline 9/10 R LE pain at worst (01/12/2022); 3/10 R sciatic and L5 dermatome pain at most for the past 7 days; has R > L heel pain however which wakes her up at night. (02/16/2022); 4/10 R LE pain at most for the past 7 days. R heel pain however wakes her up at night. (03/12/2022), 3/10 R LE pain at worst for the past 7 days (  04/02/2022); R heel bothered her last night (possibly plantar fasciitis), 3/10 R LE pain at most for the past 7 days (05/06/2022)    Time 8    Period Weeks    Status Achieved    Target Date 05/07/22      PT LONG TERM GOAL #3   Title Pt will improve her FOTO score by at least 10 points as a demontsration of improved function.    Baseline Lumbar Spine FOTO 39 (01/12/2022); 46 (02/16/2022); 52 (03/12/2022)    Time 8    Period Weeks    Status Achieved    Target Date 05/07/22      PT LONG TERM GOAL #4   Title Pt will be able to ambulate at least 100 ft independently to promote mobility and return to prior level of function.    Baseline Able to ambulate with rw short  distance, about 40 ft with reproduction of pain. (01/12/2022); able to ambulate 120 ft with rw, SBA (02/16/2022); SPC 40 ft, CGA (03/12/2022); Pt able to ambulate 88 ft with SPC on L side (03/31/2022); 210 ft with SPC on L side, rw 200 ft, no pain (05/06/2022); SPC on L side 200 ft, then no AD but CGA 100 ft x 2 with rest break in between bouts, no back pain reported (05/20/2022)    Time 8    Period Weeks    Status Partially Met    Target Date 07/02/22              Plan - 06/01/22 1731     Clinical Impression Statement Decreased mid back pain with treatment to improve trunk posture and thoracic extension. Continued working on glute and functional LE strength to promote ability to ambulate with less difficulty.  Pt tolerated session well without aggravation of back pain symptoms. Pt will benefit from continued skilled physical therapy services to improve ability to ambulate independently and on uneven surfaces, as well as improve activity tolerance and continue to decrease low back pain.    Personal Factors and Comorbidities Comorbidity 3+;Age;Time since onset of injury/illness/exacerbation;Fitness    Comorbidities Arthritis, atrial fibrillation, HTN, idiopathic pulmonary fibrosis    Examination-Activity Limitations Stairs;Bed Mobility;Stand;Sit;Lift;Transfers;Locomotion Level;Carry    Stability/Clinical Decision Making Stable/Uncomplicated    Clinical Decision Making Low    Rehab Potential Fair    PT Frequency 2x / week    PT Duration 8 weeks    PT Treatment/Interventions Therapeutic activities;Therapeutic exercise;Gait training;Balance training;Neuromuscular re-education;Patient/family education;Manual techniques;Dry needling;Electrical Stimulation;Iontophoresis 4m/ml Dexamethasone    PT Next Visit Plan Posture, thoracic extension, trunk and glute strength, lumbopelvic control during gait, manual techniques, modalities PRN    PT Home Exercise Plan Medbridge Access Code: [email or web address]   Consulted and  Agree with Plan of Care Patient                                    MJoneen BoersPT, DPT  06/01/2022, 5:40 PM

## 2022-06-08 ENCOUNTER — Ambulatory Visit: Payer: Medicare PPO

## 2022-06-08 DIAGNOSIS — M5459 Other low back pain: Secondary | ICD-10-CM | POA: Diagnosis not present

## 2022-06-08 DIAGNOSIS — M6281 Muscle weakness (generalized): Secondary | ICD-10-CM

## 2022-06-08 DIAGNOSIS — M5417 Radiculopathy, lumbosacral region: Secondary | ICD-10-CM

## 2022-06-08 DIAGNOSIS — R262 Difficulty in walking, not elsewhere classified: Secondary | ICD-10-CM

## 2022-06-08 DIAGNOSIS — M5431 Sciatica, right side: Secondary | ICD-10-CM

## 2022-06-08 DIAGNOSIS — Z9181 History of falling: Secondary | ICD-10-CM

## 2022-06-08 NOTE — Therapy (Signed)
OUTPATIENT PHYSICAL THERAPY TREATMENT NOTE        Patient Name: Joyce Evans MRN: [id number] DOB:April 16, 1941, 81 y.o., female Today's Date: 06/08/2022  PCP: Derinda Late, MD  REFERRING PROVIDER: Leonie Green, NP   PT End of Session - 06/08/22 1632     Visit Number 34    Number of Visits 51    Date for PT Re-Evaluation 07/02/22    Authorization Type [employer]    Progress Note Due on Visit 10    PT Start Time 1632    PT Stop Time 1714    PT Time Calculation (min) 42 min    Equipment Utilized During Treatment Gait belt    Activity Tolerance Patient tolerated treatment well;No increased pain    Behavior During Therapy WFL for tasks assessed/performed                                         Past Medical History:  Diagnosis Date   Actinic keratosis    Arthritis    osteo - shoulders, fingers   Atrial fibrillation (HCC)    Dizziness    thinks because of diuretic   Family history of adverse reaction to anesthesia    daughter -PONV   Gallstones 2016, 2017   GERD (gastroesophageal reflux disease)    Glaucoma    Heart murmur    history of   Hypertension    Melanoma ([place]) 04/20/2016   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 Excised: 05/19/2016, margins free.   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   Tricuspid valve disorder    leak   Past Surgical History:  Procedure Laterality Date   ABDOMINAL HYSTERECTOMY  1987   partial   CARDIAC CATHETERIZATION  2010   [facility]   CATARACT EXTRACTION W/PHACO Right 08/28/2015   Procedure: CATARACT EXTRACTION PHACO AND INTRAOCULAR LENS PLACEMENT ([place]);  Surgeon: Leandrew Koyanagi, MD;  Location: [place];  Service: Ophthalmology;  Laterality: Right;  TORIC   INTRAMEDULLARY (IM) NAIL INTERTROCHANTERIC Right 10/16/2021   Procedure:  INTRAMEDULLARY (IM) NAIL INTERTROCHANTRIC;  Surgeon: Renee Harder, MD;  Location: [facility];  Service: Orthopedics;  Laterality: Right;   MELANOMA EXCISION Left 05/19/2016   MIS. Left mid. lat. tricep. 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 S/P IM nail placement for right intertrochanteric hip fracture with Dr. Sharlet Salina on 10/16/2021. Pt slipped on the kithen floor. Pt was getting something from the microwave and pt tripped on her O2 tube. Pt was independent with ambulation prior to her fall. Pt fell before in the woods and pt tripped over a root 3 years ago (no injuries per pt). Pt has been using  supplemental O2 for the past 2 years in November. Pt has interstitial lung disease (idiopathic pulmonary fibrosis). Recent breathing test is stable. Back pain began when pt was in Rehab in [place] [facility]. Feels like it is sciatical. Pt radiates along the sciatic nerve and R L 5 dermatome. Pt is healing fine as far as her R hip surgery is concerned. Also has R medial knee pain due to arthritis. Had home health PT which involves seated knee extension, hip flexion, standing hip abduction, extension. Pt did some walking wiht a rw. Feels a lot of pain all over. Walking too far is limited due to her O2 situation.  PRECAUTIONS: SpO2 not under 90 %  SUBJECTIVE:No back pain currently. Had back pain when at the beach.   PAIN:  Are you having pain? See subjective       TODAY'S TREATMENT:    06/08/2022   Therapeutic exercise    At start of session:    SpO2 3 L 99%, HR 50   Gait with SPC on L side with pt now carrying her supplemental O2 R shoulder strap   100 ft CGA to SBA  SpO2 87%, HR 63 (improves to 98%, HR 55 after rest  . Forward step up onto Air Ex pad with contralateral UE assist   R 10x  L 10x   After first set 87%, HR 84 (improves to 100%, HR 57 after rest)    After first set 88%, HR 68 (improves to 96%, HR 54 after rest)   Forward stepping over 2 mini hurdles, alternating feet with one UE assist 10x  83%, HR 83 (improves to 98%, HR 55 after rest)    Standing with B UE assist    Hip abduction     with yellow band around ankles     R 10x4 (98%, HR 70),   Seated manually resisted R trunk lateral shift isometrics in neutral, 10x5 seconds for 3 sets  Decreased L thoracolumbar paraspinal muscle tension   98%, HR 69 (after first 2 sets)  96%, HR 61 (after 3rd set)   Seated R hip ER 10x3  Seated hip adduction small green physioball squeeze 10x5 seconds for 2 sets    98%, HR 57 after session     Therapeutic rest breaks utilized for oxygen and muscle recovery.      Improved exercise technique, movement at target joints, use of target muscles after mod verbal, visual, tactile cues.      Response to treatment Pt tolerated session well without aggravation of back pain.        Clinical impression  Worked on obstacle negotiation as well as stepping onto uneven surface with one UE assist to improve balance. Continued working on posture, trunk and hip strength to promote ability to ambulate with less difficulty.  Pt tolerated session well without aggravation of back pain symptoms. Pt will benefit from continued skilled physical therapy services to improve ability to ambulate independently and on uneven surfaces, as well as improve activity tolerance and continue to decrease low back pain.        PATIENT EDUCATION: Education  details: ther-ex, HEP Person educated: Patient Education method: Explanation, Demonstration, Tactile cues, Verbal cues, and Handouts Education comprehension: verbalized understanding and returned demonstration   HOME EXERCISE PROGRAM: Access Code: [email or web address] URL: [email or web address] Date: 01/20/2022 Prepared by: Joneen Boers  Exercises - Seated Transversus Abdominis Bracing  - 1 x daily - 7 x weekly - 3 sets - 10 reps - 5 seconds hold -  Seated Gluteal Sets  - 1 x daily - 7 x weekly - 3 sets - 10 reps - 5 seconds hold - Discontinued 03/12/2022: Seated Scapular Retraction  - 1 x daily - 7 x weekly - 3 sets - 10 reps - 5 seconds  hold  Can perform aforementioned exercises in standing now.    - Seated Hip Adduction Isometrics with Ball  - 1 x daily - 7 x weekly - 2 sets - 10 reps - 5 seconds hold - Standing Hip Abduction with Counter Support  - 1 x daily - 7 x weekly - 3 sets - 10 reps - Discontinued on 03/09/2022: Seated Hip Abduction with Resistance  - 1 x daily - 7 x weekly - 2 sets - 10 reps  - Supine Posterior Pelvic Tilt  - 3 x daily - 7 x weekly - 3 sets - 10 reps - 5 seconds hold - Hooklying Clamshell with Resistance  - 1 x daily - 7 x weekly - 3 sets - 10 reps - Standing Posterior Pelvic Tilt  - 3 x daily - 7 x weekly - 3 sets - 10 reps - 5 seconds hold   PT Short Term Goals - 02/16/22 0941       PT SHORT TERM GOAL #1   Title Pt will be independent with her initial HEP to decrease pain, improve strength, function, and ability to stand and ambulate more comfortably.    Baseline No questions with HEP, has been doing them (02/16/2022)    Time 3    Period Weeks    Status Achieved    Target Date 02/05/22              PT Long Term Goals - 05/20/22 0926       PT LONG TERM GOAL #1   Title Pt will have a decrease in low back pain to 3/10 or less at worst to promote ability to stand and ambulate with less difficulty.    Baseline 6/10 low back pain at worst for  the past 2 months (01/12/2022); 7/10 at most for the past 7 days (standing in the kitchen, more standing than normal becuase pt had guests over from [facility]) ( 02/16/2022);  6-7/10 back pain at worst for the past 7 days. Back pain at worst goes away faster since starting PT. (03/09/2022). R low back pain has not been bothering her. Thoracic back pain is about a 6/10 at worst for the past 7 days (04/02/2022); 4/10 low back pain at worst for the past 7 days (05/06/2022); 4-5/10 at worst for the past 7 days, comes and goes (05/20/2022)    Time 8    Period Weeks    Status Partially Met    Target Date 07/02/22      PT LONG TERM GOAL #2   Title Pt will have a decrease in R LE pain to 3/10 or less at worst to promote ability to stand and ambulate will less difficulty.    Baseline 9/10 R LE pain at worst (01/12/2022); 3/10 R sciatic and L5 dermatome pain at most for the past 7 days; has R > L heel pain however which wakes her up at night. (02/16/2022); 4/10 R LE pain at most for the past 7 days. R heel pain however wakes her up at night. (03/12/2022), 3/10 R LE pain at worst for the past 7 days (04/02/2022); R heel bothered her last night (possibly plantar fasciitis), 3/10 R LE pain at most for the  past 7 days (05/06/2022)    Time 8    Period Weeks    Status Achieved    Target Date 05/07/22      PT LONG TERM GOAL #3   Title Pt will improve her FOTO score by at least 10 points as a demontsration of improved function.    Baseline Lumbar Spine FOTO 39 (01/12/2022); 46 (02/16/2022); 52 (03/12/2022)    Time 8    Period Weeks    Status Achieved    Target Date 05/07/22      PT LONG TERM GOAL #4   Title Pt will be able to ambulate at least 100 ft independently to promote mobility and return to prior level of function.    Baseline Able to ambulate with rw short distance, about 40 ft with reproduction of pain. (01/12/2022); able to ambulate 120 ft with rw, SBA (02/16/2022); SPC 40 ft, CGA (03/12/2022); Pt able to ambulate 88  ft with SPC on L side (03/31/2022); 210 ft with SPC on L side, rw 200 ft, no pain (05/06/2022); SPC on L side 200 ft, then no AD but CGA 100 ft x 2 with rest break in between bouts, no back pain reported (05/20/2022)    Time 8    Period Weeks    Status Partially Met    Target Date 07/02/22              Plan - 06/08/22 1632     Clinical Impression Statement Worked on obstacle negotiation as well as stepping onto uneven surface with one UE assist to improve balance. Continued working on posture, trunk and hip strength to promote ability to ambulate with less difficulty.  Pt tolerated session well without aggravation of back pain symptoms. Pt will benefit from continued skilled physical therapy services to improve ability to ambulate independently and on uneven surfaces, as well as improve activity tolerance and continue to decrease low back pain.    Personal Factors and Comorbidities Comorbidity 3+;Age;Time since onset of injury/illness/exacerbation;Fitness    Comorbidities Arthritis, atrial fibrillation, HTN, idiopathic pulmonary fibrosis    Examination-Activity Limitations Stairs;Bed Mobility;Stand;Sit;Lift;Transfers;Locomotion Level;Carry    Stability/Clinical Decision Making Stable/Uncomplicated    Rehab Potential Fair    PT Frequency 2x / week    PT Duration 8 weeks    PT Treatment/Interventions Therapeutic activities;Therapeutic exercise;Gait training;Balance training;Neuromuscular re-education;Patient/family education;Manual techniques;Dry needling;Electrical Stimulation;Iontophoresis 57m/ml Dexamethasone    PT Next Visit Plan Posture, thoracic extension, trunk and glute strength, lumbopelvic control during gait, manual techniques, modalities PRN    PT Home Exercise Plan Medbridge Access Code: [email or web address]   Consulted and Agree with Plan of Care Patient                                     MJoneen BoersPT, DPT  06/08/2022, 5:24 PM

## 2022-06-10 ENCOUNTER — Ambulatory Visit: Payer: Medicare PPO

## 2022-06-16 ENCOUNTER — Ambulatory Visit: Payer: Medicare PPO | Attending: Orthopedic Surgery

## 2022-06-16 DIAGNOSIS — M5459 Other low back pain: Secondary | ICD-10-CM | POA: Insufficient documentation

## 2022-06-16 DIAGNOSIS — Z9181 History of falling: Secondary | ICD-10-CM | POA: Insufficient documentation

## 2022-06-16 DIAGNOSIS — R262 Difficulty in walking, not elsewhere classified: Secondary | ICD-10-CM | POA: Insufficient documentation

## 2022-06-16 DIAGNOSIS — M6281 Muscle weakness (generalized): Secondary | ICD-10-CM | POA: Diagnosis present

## 2022-06-16 DIAGNOSIS — M5417 Radiculopathy, lumbosacral region: Secondary | ICD-10-CM | POA: Diagnosis present

## 2022-06-16 DIAGNOSIS — M5431 Sciatica, right side: Secondary | ICD-10-CM | POA: Insufficient documentation

## 2022-06-16 NOTE — Therapy (Signed)
OUTPATIENT PHYSICAL THERAPY TREATMENT NOTE        Patient Name: Joyce Evans MRN: [id number] DOB:20-Jan-1941, 81 y.o., female Today's Date: 06/16/2022  PCP: Derinda Late, MD  REFERRING PROVIDER: Leonie Green, NP   PT End of Session - 06/16/22 1503     Visit Number 35    Number of Visits 79    Date for PT Re-Evaluation 07/02/22    Authorization Type [employer]    Progress Note Due on Visit 10    PT Start Time 1503    PT Stop Time 1545    PT Time Calculation (min) 42 min    Equipment Utilized During Treatment Gait belt    Activity Tolerance Patient tolerated treatment well;No increased pain    Behavior During Therapy WFL for tasks assessed/performed                                          Past Medical History:  Diagnosis Date   Actinic keratosis    Arthritis    osteo - shoulders, fingers   Atrial fibrillation (HCC)    Dizziness    thinks because of diuretic   Family history of adverse reaction to anesthesia    daughter -PONV   Gallstones 2016, 2017   GERD (gastroesophageal reflux disease)    Glaucoma    Heart murmur    history of   Hypertension    Melanoma ([place]) 04/20/2016   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 Excised: 05/19/2016, margins free.   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   Tricuspid valve disorder    leak   Past Surgical History:  Procedure Laterality Date   ABDOMINAL HYSTERECTOMY  1987   partial   CARDIAC CATHETERIZATION  2010   [facility]   CATARACT EXTRACTION W/PHACO Right 08/28/2015   Procedure: CATARACT EXTRACTION PHACO AND INTRAOCULAR LENS PLACEMENT ([place]);  Surgeon: Leandrew Koyanagi, MD;  Location: [facility];  Service: Ophthalmology;  Laterality: Right;  TORIC   INTRAMEDULLARY (IM) NAIL INTERTROCHANTERIC Right 10/16/2021   Procedure:  INTRAMEDULLARY (IM) NAIL INTERTROCHANTRIC;  Surgeon: Renee Harder, MD;  Location: [facility];  Service: Orthopedics;  Laterality: Right;   MELANOMA EXCISION Left 05/19/2016   MIS. Left mid. lat. tricep. 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 S/P IM nail placement for right intertrochanteric hip fracture with Dr. Sharlet Salina on 10/16/2021. Pt slipped on the kithen floor. Pt was getting something from the microwave and pt tripped on her O2 tube. Pt was independent with ambulation prior to her fall. Pt fell before in the woods and pt tripped over a root 3 years ago (no injuries per pt). Pt has been  using supplemental O2 for the past 2 years in November. Pt has interstitial lung disease (idiopathic pulmonary fibrosis). Recent breathing test is stable. Back pain began when pt was in Rehab in [place] [facility]. Feels like it is sciatical. Pt radiates along the sciatic nerve and R L 5 dermatome. Pt is healing fine as far as her R hip surgery is concerned. Also has R medial knee pain due to arthritis. Had home health PT which involves seated knee extension, hip flexion, standing hip abduction, extension. Pt did some walking wiht a rw. Feels a lot of pain all over. Walking too far is limited due to her O2 situation.  PRECAUTIONS: SpO2 not under 90 %  SUBJECTIVE: Got a good report from the cardiologist office.  L atrium is enlarged. As far as heart rate going down, with A-fib, one just has to take an  average. As far as with the heart monitor, they look for if there is a gap in the heart rate and there was none. Back feels fine. Was told to keep on doing the exercises.    PAIN:  Are you having pain? See subjective       TODAY'S TREATMENT:   06/16/2022   Therapeutic exercise    At start of session:   SpO2 3 L 98%, HR 53  Gait with SPC on L side with pt now carrying her supplemental O2 R shoulder strap   100 ft CGA to SBA  SpO2 84%, HR 77 (improves to 97%, HR 60 after rest   .100 ft CGA to SBA  SpO2 85%, HR 73 (improves to 96%, HR 67 after rest   Forward stepping over 2 mini hurdles, alternating feet with one UE assist 10x, then 8x. R knee discomfort after second round.   83%, HR 89 (improves to 97%, HR 73 after rest)   After 2nd set  (8x) 83%, HR 79 (improves to 96%, HR 73)    Seated B scapular retraction 10x5 seconds for 3 sets. Improved SpO2 observed.    Seated R hip ER 10x2 with 5 second holds   97%, HR 67 SpO2 at 4L after session.      Therapeutic rest breaks utilized for oxygen and muscle recovery.      Improved exercise technique, movement at target joints, use of target muscles after mod verbal, visual, tactile cues.      Response to treatment Pt tolerated session well without aggravation of back pain.        Clinical impression  Worked on gait with SPC as well as obstacle negotiation to promote ability to ambulate with less difficulty and decrease fall risk. Challenges to progress with mobility includes pulmonary fibrosis which limit exercise progression. Worked on scapular retraction to improve lung surface area when breathing. No complain of back pain throughout session. Pt will benefit from continued skilled physical therapy services to improve ability to ambulate independently and on uneven surfaces, as well as improve activity tolerance and continue to decrease low back pain.        PATIENT EDUCATION: Education details: ther-ex,  HEP Person educated: Patient Education method: Explanation, Demonstration, Tactile cues, Verbal cues, and Handouts Education comprehension: verbalized understanding and returned demonstration   HOME EXERCISE PROGRAM: Access Code: [email or web address] URL: [email or web address] Date: 01/20/2022 Prepared by: Joneen Boers  Exercises - Seated Transversus Abdominis Bracing  - 1 x daily - 7 x weekly - 3 sets - 10 reps - 5 seconds hold - Seated Gluteal Sets  - 1 x daily -  7 x weekly - 3 sets - 10 reps - 5 seconds hold - Discontinued 03/12/2022: Seated Scapular Retraction  - 1 x daily - 7 x weekly - 3 sets - 10 reps - 5 seconds  hold  Can perform aforementioned exercises in standing now.    - Seated Hip Adduction Isometrics with Ball  - 1 x daily - 7 x weekly - 2 sets - 10 reps - 5 seconds hold - Standing Hip Abduction with Counter Support  - 1 x daily - 7 x weekly - 3 sets - 10 reps - Discontinued on 03/09/2022: Seated Hip Abduction with Resistance  - 1 x daily - 7 x weekly - 2 sets - 10 reps  - Supine Posterior Pelvic Tilt  - 3 x daily - 7 x weekly - 3 sets - 10 reps - 5 seconds hold - Hooklying Clamshell with Resistance  - 1 x daily - 7 x weekly - 3 sets - 10 reps - Standing Posterior Pelvic Tilt  - 3 x daily - 7 x weekly - 3 sets - 10 reps - 5 seconds hold   PT Short Term Goals - 02/16/22 0941       PT SHORT TERM GOAL #1   Title Pt will be independent with her initial HEP to decrease pain, improve strength, function, and ability to stand and ambulate more comfortably.    Baseline No questions with HEP, has been doing them (02/16/2022)    Time 3    Period Weeks    Status Achieved    Target Date 02/05/22              PT Long Term Goals - 05/20/22 0926       PT LONG TERM GOAL #1   Title Pt will have a decrease in low back pain to 3/10 or less at worst to promote ability to stand and ambulate with less difficulty.    Baseline 6/10 low back pain at worst for the past 2 months  (01/12/2022); 7/10 at most for the past 7 days (standing in the kitchen, more standing than normal becuase pt had guests over from [facility]) ( 02/16/2022);  6-7/10 back pain at worst for the past 7 days. Back pain at worst goes away faster since starting PT. (03/09/2022). R low back pain has not been bothering her. Thoracic back pain is about a 6/10 at worst for the past 7 days (04/02/2022); 4/10 low back pain at worst for the past 7 days (05/06/2022); 4-5/10 at worst for the past 7 days, comes and goes (05/20/2022)    Time 8    Period Weeks    Status Partially Met    Target Date 07/02/22      PT LONG TERM GOAL #2   Title Pt will have a decrease in R LE pain to 3/10 or less at worst to promote ability to stand and ambulate will less difficulty.    Baseline 9/10 R LE pain at worst (01/12/2022); 3/10 R sciatic and L5 dermatome pain at most for the past 7 days; has R > L heel pain however which wakes her up at night. (02/16/2022); 4/10 R LE pain at most for the past 7 days. R heel pain however wakes her up at night. (03/12/2022), 3/10 R LE pain at worst for the past 7 days (04/02/2022); R heel bothered her last night (possibly plantar fasciitis), 3/10 R LE pain at most for the past 7 days (05/06/2022)    Time 8  Period Weeks    Status Achieved    Target Date 05/07/22      PT LONG TERM GOAL #3   Title Pt will improve her FOTO score by at least 10 points as a demontsration of improved function.    Baseline Lumbar Spine FOTO 39 (01/12/2022); 46 (02/16/2022); 52 (03/12/2022)    Time 8    Period Weeks    Status Achieved    Target Date 05/07/22      PT LONG TERM GOAL #4   Title Pt will be able to ambulate at least 100 ft independently to promote mobility and return to prior level of function.    Baseline Able to ambulate with rw short distance, about 40 ft with reproduction of pain. (01/12/2022); able to ambulate 120 ft with rw, SBA (02/16/2022); SPC 40 ft, CGA (03/12/2022); Pt able to ambulate 88 ft with SPC on L  side (03/31/2022); 210 ft with SPC on L side, rw 200 ft, no pain (05/06/2022); SPC on L side 200 ft, then no AD but CGA 100 ft x 2 with rest break in between bouts, no back pain reported (05/20/2022)    Time 8    Period Weeks    Status Partially Met    Target Date 07/02/22              Plan - 06/16/22 1502     Clinical Impression Statement Worked on gait with SPC as well as obstacle negotiation to promote ability to ambulate with less difficulty and decrease fall risk. Challenges to progress with mobility includes pulmonary fibrosis which limit exercise progression. Worked on scapular retraction to improve lung surface area when breathing. No complain of back pain throughout session. Pt will benefit from continued skilled physical therapy services to improve ability to ambulate independently and on uneven surfaces, as well as improve activity tolerance and continue to decrease low back pain.    Personal Factors and Comorbidities Comorbidity 3+;Age;Time since onset of injury/illness/exacerbation;Fitness    Comorbidities Arthritis, atrial fibrillation, HTN, idiopathic pulmonary fibrosis    Examination-Activity Limitations Stairs;Bed Mobility;Stand;Sit;Lift;Transfers;Locomotion Level;Carry    Stability/Clinical Decision Making Stable/Uncomplicated    Rehab Potential Fair    PT Frequency 2x / week    PT Duration 8 weeks    PT Treatment/Interventions Therapeutic activities;Therapeutic exercise;Gait training;Balance training;Neuromuscular re-education;Patient/family education;Manual techniques;Dry needling;Electrical Stimulation;Iontophoresis 61m/ml Dexamethasone    PT Next Visit Plan Posture, thoracic extension, trunk and glute strength, lumbopelvic control during gait, manual techniques, modalities PRN    PT Home Exercise Plan Medbridge Access Code: [email or web address]   Consulted and Agree with Plan of Care Patient                                      MJoneen BoersPT,  DPT  06/16/2022, 5:36 PM

## 2022-06-18 ENCOUNTER — Ambulatory Visit: Payer: Medicare PPO

## 2022-06-18 DIAGNOSIS — M5417 Radiculopathy, lumbosacral region: Secondary | ICD-10-CM

## 2022-06-18 DIAGNOSIS — M5459 Other low back pain: Secondary | ICD-10-CM | POA: Diagnosis not present

## 2022-06-18 DIAGNOSIS — R262 Difficulty in walking, not elsewhere classified: Secondary | ICD-10-CM

## 2022-06-18 DIAGNOSIS — M5431 Sciatica, right side: Secondary | ICD-10-CM

## 2022-06-18 DIAGNOSIS — M6281 Muscle weakness (generalized): Secondary | ICD-10-CM

## 2022-06-18 DIAGNOSIS — Z9181 History of falling: Secondary | ICD-10-CM

## 2022-06-18 NOTE — Therapy (Signed)
OUTPATIENT PHYSICAL THERAPY TREATMENT NOTE        Patient Name: Joyce Evans MRN: [id number] DOB:1941-02-17, 81 y.o., female Today's Date: 06/18/2022  PCP: Derinda Late, MD  REFERRING PROVIDER: Leonie Green, NP   PT End of Session - 06/18/22 0930     Visit Number 36    Number of Visits 71    Date for PT Re-Evaluation 07/02/22    Authorization Type [employer]    Progress Note Due on Visit 10    PT Start Time 0930    PT Stop Time 1013    PT Time Calculation (min) 43 min    Equipment Utilized During Treatment Gait belt    Activity Tolerance Patient tolerated treatment well;No increased pain    Behavior During Therapy WFL for tasks assessed/performed                                           Past Medical History:  Diagnosis Date   Actinic keratosis    Arthritis    osteo - shoulders, fingers   Atrial fibrillation (HCC)    Dizziness    thinks because of diuretic   Family history of adverse reaction to anesthesia    daughter -PONV   Gallstones 2016, 2017   GERD (gastroesophageal reflux disease)    Glaucoma    Heart murmur    history of   Hypertension    Melanoma ([place]) 04/20/2016   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 Excised: 05/19/2016, margins free.   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   Tricuspid valve disorder    leak   Past Surgical History:  Procedure Laterality Date   ABDOMINAL HYSTERECTOMY  1987   partial   CARDIAC CATHETERIZATION  2010   [facility]   CATARACT EXTRACTION W/PHACO Right 08/28/2015   Procedure: CATARACT EXTRACTION PHACO AND INTRAOCULAR LENS PLACEMENT ([place]);  Surgeon: Leandrew Koyanagi, MD;  Location: [facility];  Service: Ophthalmology;  Laterality: Right;  TORIC   INTRAMEDULLARY (IM) NAIL INTERTROCHANTERIC Right 10/16/2021    Procedure: INTRAMEDULLARY (IM) NAIL INTERTROCHANTRIC;  Surgeon: Renee Harder, MD;  Location: [facility];  Service: Orthopedics;  Laterality: Right;   MELANOMA EXCISION Left 05/19/2016   MIS. Left mid. lat. tricep. 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 S/P IM nail placement for right intertrochanteric hip fracture with Dr. Sharlet Salina on 10/16/2021. Pt slipped on the kithen floor. Pt was getting something from the microwave and pt tripped on her O2 tube. Pt was independent with ambulation prior to her fall. Pt fell before in the woods and pt tripped over a root 3 years ago (no injuries per pt). Pt has  been using supplemental O2 for the past 2 years in November. Pt has interstitial lung disease (idiopathic pulmonary fibrosis). Recent breathing test is stable. Back pain began when pt was in Rehab in [place] [facility]. Feels like it is sciatical. Pt radiates along the sciatic nerve and R L 5 dermatome. Pt is healing fine as far as her R hip surgery is concerned. Also has R medial knee pain due to arthritis. Had home health PT which involves seated knee extension, hip flexion, standing hip abduction, extension. Pt did some walking wiht a rw. Feels a lot of pain all over. Walking too far is limited due to her O2 situation.  PRECAUTIONS: SpO2 not under 90 %  SUBJECTIVE: Feeling nauseous in the mornings, that happens though most very morning, might be due to the medicines. Stomach usually feels  upset in the morning. Has Loose stools which comes and goes. No back pain currently. Had a little back pain when was at the recumbent bike, did the shoulder blade squeezes which helped. Usually feels nauseous until noon. Can walk at home with a cane a ways, O2 is usually 94%. The rod in her R hip bothers her now and again. Still has difficulty laying on her R side because of the rod.     PAIN:  Are you having pain? See subjective       TODAY'S TREATMENT:   06/18/2022   Therapeutic exercise    At start of session:   SpO2 3 L 99%, HR 59  Blood pressure, R arm sitting, mechanically taken, normal cuff   114/59, HR 49  Gait with SPC on L side with pt carrying her supplemental O2 R shoulder strap   100 ft CGA to SBA  SpO2 86%, HR 97 (improves to 97%, HR 56 after rest   .100 ft CGA to SBA  SpO2 90%, HR 79 (improves to 97%, HR 66 after rest   Forward stepping over 2 mini hurdles, alternating feet with one UE assist 10x  86%, HR 70 (improves to 98%, HR 66 after rest) .  Standing with B UE assist                   Hip abduction                           with red band around ankles                                                 R 10x2 (97%, HR 70)   Forward step up onto Dyna Disc with B UE assist   R 4x. R hip and knee pain.   L 10x2 (first set: 88%, HR 78; improves to  98%, HR 59 after rest)   Second set 90%, HR 88; improves to  97%, HR 65   after rest    Seated manually resisted R trunk lateral shift isometrics in neutral, 10x5 seconds for 2 sets   To decrease stress to low back.   Therapeutic rest breaks utilized for oxygen and muscle recovery.      Improved exercise technique, movement at target joints, use of target muscles after mod verbal, visual, tactile cues.      Response to treatment Pt tolerated session well without aggravation of back pain.   Decreased nausea reported  after session. Feels better per pt.        Clinical impression  Continued  working on gait with SPC as well as obstacle negotiation to promote ability to ambulate with less difficulty and decrease fall risk. Pt tolerated session well without aggravation of symptoms. Decreased nausea reported after session. Pt will benefit from continued skilled physical therapy services to improve ability to ambulate independently and on uneven surfaces, as well as improve activity tolerance and continue to decrease low back pain.        PATIENT EDUCATION: Education details: ther-ex, HEP Person educated: Patient Education method: Explanation, Demonstration, Tactile cues, Verbal cues, and Handouts Education comprehension: verbalized understanding and returned demonstration   HOME EXERCISE PROGRAM: Access Code: [email or web address] URL: [email or web address] Date: 01/20/2022 Prepared by: Joneen Boers  Exercises - Seated Transversus Abdominis Bracing  - 1 x daily - 7 x weekly - 3 sets - 10 reps - 5 seconds hold - Seated Gluteal Sets  - 1 x daily - 7 x weekly - 3 sets - 10 reps - 5 seconds hold - Discontinued 03/12/2022: Seated Scapular Retraction  - 1 x daily - 7 x weekly - 3 sets - 10 reps - 5 seconds  hold  Can perform aforementioned exercises in standing now.    - Seated Hip Adduction Isometrics with Ball  - 1 x daily - 7 x weekly - 2 sets - 10 reps - 5 seconds hold - Standing Hip Abduction with Counter Support  - 1 x daily - 7 x weekly - 3 sets - 10 reps - Discontinued on 03/09/2022: Seated Hip Abduction with Resistance  - 1 x daily - 7 x weekly - 2 sets - 10 reps  - Supine Posterior Pelvic Tilt  - 3 x daily - 7 x weekly - 3 sets - 10 reps - 5 seconds hold - Hooklying Clamshell with Resistance  - 1 x daily - 7 x weekly - 3 sets - 10 reps - Standing Posterior Pelvic Tilt  - 3 x daily - 7 x weekly - 3 sets - 10 reps - 5 seconds hold   PT Short Term Goals - 02/16/22 0941       PT SHORT TERM GOAL #1   Title Pt will be independent with her initial HEP to decrease pain,  improve strength, function, and ability to stand and ambulate more comfortably.    Baseline No questions with HEP, has been doing them (02/16/2022)    Time 3    Period Weeks    Status Achieved    Target Date 02/05/22              PT Long Term Goals - 05/20/22 0926       PT LONG TERM GOAL #1   Title Pt will have a decrease in low back pain to 3/10 or less at worst to promote ability to stand and ambulate with less difficulty.    Baseline 6/10 low back pain at worst for the past 2 months (01/12/2022); 7/10 at most for the past 7 days (standing in the kitchen, more standing than normal becuase pt had guests over from [facility]) ( 02/16/2022);  6-7/10 back pain at worst for the past 7 days. Back pain at worst goes away faster since starting PT. (03/09/2022). R low back pain has not been bothering her. Thoracic back pain is about a 6/10 at worst for the past 7 days (04/02/2022); 4/10 low back pain at worst for the past 7 days (05/06/2022);  4-5/10 at worst for the past 7 days, comes and goes (05/20/2022)    Time 8    Period Weeks    Status Partially Met    Target Date 07/02/22      PT LONG TERM GOAL #2   Title Pt will have a decrease in R LE pain to 3/10 or less at worst to promote ability to stand and ambulate will less difficulty.    Baseline 9/10 R LE pain at worst (01/12/2022); 3/10 R sciatic and L5 dermatome pain at most for the past 7 days; has R > L heel pain however which wakes her up at night. (02/16/2022); 4/10 R LE pain at most for the past 7 days. R heel pain however wakes her up at night. (03/12/2022), 3/10 R LE pain at worst for the past 7 days (04/02/2022); R heel bothered her last night (possibly plantar fasciitis), 3/10 R LE pain at most for the past 7 days (05/06/2022)    Time 8    Period Weeks    Status Achieved    Target Date 05/07/22      PT LONG TERM GOAL #3   Title Pt will improve her FOTO score by at least 10 points as a demontsration of improved function.    Baseline Lumbar  Spine FOTO 39 (01/12/2022); 46 (02/16/2022); 52 (03/12/2022)    Time 8    Period Weeks    Status Achieved    Target Date 05/07/22      PT LONG TERM GOAL #4   Title Pt will be able to ambulate at least 100 ft independently to promote mobility and return to prior level of function.    Baseline Able to ambulate with rw short distance, about 40 ft with reproduction of pain. (01/12/2022); able to ambulate 120 ft with rw, SBA (02/16/2022); SPC 40 ft, CGA (03/12/2022); Pt able to ambulate 88 ft with SPC on L side (03/31/2022); 210 ft with SPC on L side, rw 200 ft, no pain (05/06/2022); SPC on L side 200 ft, then no AD but CGA 100 ft x 2 with rest break in between bouts, no back pain reported (05/20/2022)    Time 8    Period Weeks    Status Partially Met    Target Date 07/02/22              Plan - 06/18/22 0928     Clinical Impression Statement Continued working on gait with SPC as well as obstacle negotiation to promote ability to ambulate with less difficulty and decrease fall risk. Pt tolerated session well without aggravation of symptoms. Decreased nausea reported after session. Pt will benefit from continued skilled physical therapy services to improve ability to ambulate independently and on uneven surfaces, as well as improve activity tolerance and continue to decrease low back pain.    Personal Factors and Comorbidities Comorbidity 3+;Age;Time since onset of injury/illness/exacerbation;Fitness    Comorbidities Arthritis, atrial fibrillation, HTN, idiopathic pulmonary fibrosis    Examination-Activity Limitations Stairs;Bed Mobility;Stand;Sit;Lift;Transfers;Locomotion Level;Carry    Stability/Clinical Decision Making Stable/Uncomplicated    Rehab Potential Fair    PT Frequency 2x / week    PT Duration 8 weeks    PT Treatment/Interventions Therapeutic activities;Therapeutic exercise;Gait training;Balance training;Neuromuscular re-education;Patient/family education;Manual techniques;Dry  needling;Electrical Stimulation;Iontophoresis 52m/ml Dexamethasone    PT Next Visit Plan Posture, thoracic extension, trunk and glute strength, lumbopelvic control during gait, manual techniques, modalities PRN    PT Home Exercise Plan Medbridge Access Code: [email or web address]    IRCVELFYBand Agree  with Plan of Care Patient                                      Joneen Boers PT, DPT  06/18/2022, 12:50 PM

## 2022-06-22 ENCOUNTER — Ambulatory Visit: Payer: Medicare PPO

## 2022-06-22 DIAGNOSIS — Z9181 History of falling: Secondary | ICD-10-CM

## 2022-06-22 DIAGNOSIS — M5459 Other low back pain: Secondary | ICD-10-CM

## 2022-06-22 DIAGNOSIS — M5431 Sciatica, right side: Secondary | ICD-10-CM

## 2022-06-22 DIAGNOSIS — M5417 Radiculopathy, lumbosacral region: Secondary | ICD-10-CM

## 2022-06-22 DIAGNOSIS — R262 Difficulty in walking, not elsewhere classified: Secondary | ICD-10-CM

## 2022-06-22 NOTE — Therapy (Signed)
OUTPATIENT PHYSICAL THERAPY TREATMENT NOTE     Patient Name: Joyce Evans MRN: [id number] DOB:04-27-1941, 81 y.o., female Today's Date: 06/22/2022  PCP: Derinda Late, MD  REFERRING PROVIDER: Leonie Green, NP   PT End of Session - 06/22/22 1553     Visit Number 37    Number of Visits 42    Date for PT Re-Evaluation 07/02/22    Authorization Type [employer]    Authorization Time Period 05/06/22-07/02/22    Progress Note Due on Visit 40    PT Start Time 1550    PT Stop Time 1638    PT Time Calculation (min) 48 min    Equipment Utilized During Treatment Oxygen    Activity Tolerance Patient tolerated treatment well;No increased pain    Behavior During Therapy WFL for tasks assessed/performed                Past Medical History:  Diagnosis Date   Actinic keratosis    Arthritis    osteo - shoulders, fingers   Atrial fibrillation (HCC)    Dizziness    thinks because of diuretic   Family history of adverse reaction to anesthesia    daughter -PONV   Gallstones 2016, 2017   GERD (gastroesophageal reflux disease)    Glaucoma    Heart murmur    history of   Hypertension    Melanoma ([place]) 04/20/2016   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 Excised: 05/19/2016, margins free.   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   Tricuspid valve disorder    leak   Past Surgical History:  Procedure Laterality Date   ABDOMINAL HYSTERECTOMY  1987   partial   CARDIAC CATHETERIZATION  2010   [facility]   CATARACT EXTRACTION W/PHACO Right 08/28/2015   Procedure: CATARACT EXTRACTION PHACO AND INTRAOCULAR LENS PLACEMENT ([place]);  Surgeon: Leandrew Koyanagi, MD;  Location: [facility];  Service: Ophthalmology;  Laterality: Right;  TORIC   INTRAMEDULLARY (IM) NAIL INTERTROCHANTERIC Right 10/16/2021   Procedure:  INTRAMEDULLARY (IM) NAIL INTERTROCHANTRIC;  Surgeon: Renee Harder, MD;  Location: [facility];  Service: Orthopedics;  Laterality: Right;   MELANOMA EXCISION Left 05/19/2016   MIS. Left mid. lat. tricep. 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 S/P IM nail placement for right intertrochanteric hip fracture with Dr. Sharlet Salina on 10/16/2021. Pt slipped on the kithen floor. Pt was getting something from the microwave and pt tripped on her O2 tube. Pt was independent with ambulation prior to her fall. Pt fell before in the woods and pt tripped over a root 3 years ago (no injuries per pt). Pt has been using supplemental O2 for the past 2 years in November. Pt has interstitial lung disease (idiopathic pulmonary fibrosis). Recent breathing test is stable. Back pain began when  pt was in Rehab in [place] [facility]. Feels like it is sciatical. Pt radiates along the sciatic nerve and R L 5 dermatome. Pt is healing fine as far as her R hip surgery is concerned. Also has R medial knee pain due to arthritis. Had home health PT which involves seated knee extension, hip flexion, standing hip abduction, extension. Pt did some walking wiht a rw. Feels a lot of pain all over. Walking too far is limited due to her O2 situation.  PRECAUTIONS: SpO2 not under 90%  SUBJECTIVE: Pt doing well today, no updates since last session. She continues to work on [employer] and continues to work on her riding her recumbent bike at home.    PAIN:  Are you having  pain? No pain today   TODAY'S TREATMENT:  -overground AMB in clinic 573f, c rollator on 5L/min -4 minutes 28 sec -mild dyspnea at end of walk, Rt knee pain is the most limiting part, but distance limited by   -Forward step up/step down plastic step x20 c 180 degree turns c SPC LUE (up with good, down with bad)  -airex normal stance horizontal head turns x12, vertical head turns x10, eyes closed x30sec  -AMB up folded tumble mat, across, step down, 180 degree turnaround, repeat x8 c SPC LUE      Clinical impression Continued working on dynamic overground balance with SPC. Pt does excellent tolerating 500+ft AMB c 4WW on 4L. Pt will benefit from continued skilled physical therapy services to improve ability to ambulate independently and on uneven surfaces, as well as improve activity tolerance and continue to decrease low back pain.        PATIENT EDUCATION: Education details: ther-ex, HEP Person educated: Patient Education method: Explanation, Demonstration, Tactile cues, Verbal cues, and Handouts Education comprehension: verbalized understanding and returned demonstration   HOME EXERCISE PROGRAM: No updates this session    PT Short Term Goals - 02/16/22 0941       PT SHORT TERM GOAL #1   Title Pt will be independent with her initial HEP to decrease pain, improve strength, function, and ability to stand and ambulate more comfortably.    Baseline No questions with HEP, has been doing them (02/16/2022)    Time 3    Period Weeks    Status Achieved    Target Date 02/05/22              PT Long Term Goals - 05/20/22 0926       PT LONG TERM GOAL #1   Title Pt will have a decrease in low back pain to 3/10 or less at worst to promote ability to stand and ambulate with less difficulty.    Baseline 6/10 low back pain at worst for the past 2 months (01/12/2022); 7/10 at most for the past 7 days (standing in the kitchen, more standing than normal becuase [facility] ( 02/[facility]);  6-7/10 back pain at worst for the past 7 days. Back pain at worst goes away faster since starting PT. (03/09/2022). R low back pain has not been bothering her. Thoracic back pain is about a 6/10 at worst for the past 7 days (04/02/2022); 4/10 low back pain at worst for the past 7 days (05/06/2022); 4-5/10 at worst for the past 7 days, comes and goes (05/20/2022)    Time 8    Period Weeks    Status Partially Met    Target Date 07/02/22  PT LONG TERM GOAL #2   Title Pt will have a decrease in R LE pain to 3/10 or less at worst to promote ability to stand and ambulate will less difficulty.    Baseline 9/10 R LE pain at worst (01/12/2022); 3/10 R sciatic and L5 dermatome pain at most for the past 7 days; has R > L heel pain however which wakes her up at night. (02/16/2022); 4/10 R LE pain at most for the past 7 days. R heel pain however wakes her up at night. (03/12/2022), 3/10 R LE pain at worst for the past 7 days (04/02/2022); R heel bothered her last night (possibly plantar fasciitis), 3/10 R LE pain at most for the past 7 days (05/06/2022)    Time 8    Period Weeks    Status Achieved    Target Date 05/07/22      PT LONG TERM GOAL #3   Title Pt will improve her FOTO score by at least 10 points as a demontsration of improved function.    Baseline Lumbar Spine FOTO 39 (01/12/2022); 46 (02/16/2022); 52 (03/12/2022)    Time 8    Period Weeks    Status Achieved    Target Date 05/07/22      PT LONG TERM GOAL #4   Title Pt will be able to ambulate at least 100 ft independently to promote mobility and return to prior level of function.    Baseline Able to ambulate with rw short distance, about 40 ft with reproduction of pain. (01/12/2022); able to ambulate 120 ft with rw, SBA (02/16/2022); SPC 40 ft, CGA (03/12/2022); Pt able to ambulate 88 ft with SPC on L side (03/31/2022); 210 ft with SPC on L side, rw 200 ft, no pain (05/06/2022); SPC on L side 200 ft, then no AD but CGA 100 ft x 2  with rest break in between bouts, no back pain reported (05/20/2022)    Time 8    Period Weeks    Status Partially Met    Target Date 07/02/22              Plan - 06/22/22 1557     Clinical Impression Statement Continued working on dynamic overground balance with SPC. Pt does excellent tolerating 500+ft AMB c 4WW on 4L. Pt will benefit from continued skilled physical therapy services to improve ability to ambulate independently and on uneven surfaces, as well as improve activity tolerance and continue to decrease low back pain.    Personal Factors and Comorbidities Comorbidity 3+;Age;Time since onset of injury/illness/exacerbation;Fitness    Comorbidities Arthritis, atrial fibrillation, HTN, idiopathic pulmonary fibrosis    Examination-Activity Limitations Stairs;Bed Mobility;Stand;Sit;Lift;Transfers;Locomotion Level;Carry    Stability/Clinical Decision Making Stable/Uncomplicated    Clinical Decision Making Low    Rehab Potential Fair    PT Frequency 2x / week    PT Duration 8 weeks    PT Treatment/Interventions Therapeutic activities;Therapeutic exercise;Gait training;Balance training;Neuromuscular re-education;Patient/family education;Manual techniques;Dry needling;Electrical Stimulation;Iontophoresis 67m/ml Dexamethasone    PT Next Visit Plan Posture, thoracic extension, trunk and glute strength, lumbopelvic control during gait, manual techniques, modalities PRN    PT Home Exercise Plan Medbridge Access Code: [email or web address]   Consulted and Agree with Plan of Care Patient               4:37 PM, 06/22/22 AEtta Grandchild PT, DPT Physical Therapist - [phone](Office)    06/22/2022, 3:59 PM

## 2022-06-24 ENCOUNTER — Ambulatory Visit: Payer: Medicare PPO

## 2022-06-24 DIAGNOSIS — M5459 Other low back pain: Secondary | ICD-10-CM

## 2022-06-24 DIAGNOSIS — M5431 Sciatica, right side: Secondary | ICD-10-CM

## 2022-06-24 DIAGNOSIS — M6281 Muscle weakness (generalized): Secondary | ICD-10-CM

## 2022-06-24 DIAGNOSIS — R262 Difficulty in walking, not elsewhere classified: Secondary | ICD-10-CM

## 2022-06-24 DIAGNOSIS — Z9181 History of falling: Secondary | ICD-10-CM

## 2022-06-24 DIAGNOSIS — M5417 Radiculopathy, lumbosacral region: Secondary | ICD-10-CM

## 2022-06-24 NOTE — Therapy (Signed)
OUTPATIENT PHYSICAL THERAPY TREATMENT NOTE        Patient Name: Joyce Evans MRN: [id number] DOB:03/21/1941, 81 y.o., female Today's Date: 06/24/2022  PCP: Derinda Late, MD  REFERRING PROVIDER: Leonie Green, NP   PT End of Session - 06/24/22 1502     Visit Number 38    Number of Visits 22    Date for PT Re-Evaluation 07/02/22    Authorization Type [employer]    Authorization Time Period 05/06/22-07/02/22    Progress Note Due on Visit 40    PT Start Time 1501    PT Stop Time 1541    PT Time Calculation (min) 40 min    Equipment Utilized During Treatment Oxygen    Activity Tolerance Patient tolerated treatment well;No increased pain    Behavior During Therapy WFL for tasks assessed/performed                                            Past Medical History:  Diagnosis Date   Actinic keratosis    Arthritis    osteo - shoulders, fingers   Atrial fibrillation (HCC)    Dizziness    thinks because of diuretic   Family history of adverse reaction to anesthesia    daughter -PONV   Gallstones 2016, 2017   GERD (gastroesophageal reflux disease)    Glaucoma    Heart murmur    history of   Hypertension    Melanoma ([place]) 04/20/2016   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 Excised: 05/19/2016, margins free.   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   Tricuspid valve disorder    leak   Past Surgical History:  Procedure Laterality Date   ABDOMINAL HYSTERECTOMY  1987   partial   CARDIAC CATHETERIZATION  2010   [facility]   CATARACT EXTRACTION W/PHACO Right 08/28/2015   Procedure: CATARACT EXTRACTION PHACO AND INTRAOCULAR LENS PLACEMENT ([place]);  Surgeon: Leandrew Koyanagi, MD;  Location: [place];  Service: Ophthalmology;  Laterality: Right;  TORIC   INTRAMEDULLARY (IM)  NAIL INTERTROCHANTERIC Right 10/16/2021   Procedure: INTRAMEDULLARY (IM) NAIL INTERTROCHANTRIC;  Surgeon: Renee Harder, MD;  Location: [facility];  Service: Orthopedics;  Laterality: Right;   MELANOMA EXCISION Left 05/19/2016   MIS. Left mid. lat. tricep. 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 S/P IM nail placement for right intertrochanteric hip fracture with Dr. Sharlet Salina on 10/16/2021. Pt slipped on the kithen floor. Pt was getting something from the microwave and pt tripped on her O2 tube. Pt was independent with ambulation prior to her fall. Pt fell before in the woods and pt tripped over a root 3 years  ago (no injuries per pt). Pt has been using supplemental O2 for the past 2 years in November. Pt has interstitial lung disease (idiopathic pulmonary fibrosis). Recent breathing test is stable. Back pain began when pt was in Rehab in [place] [facility]. Feels like it is sciatical. Pt radiates along the sciatic nerve and R L 5 dermatome. Pt is healing fine as far as her R hip surgery is concerned. Also has R medial knee pain due to arthritis. Had home health PT which involves seated knee extension, hip flexion, standing hip abduction, extension. Pt did some walking wiht a rw. Feels a lot of pain all over. Walking too far is limited due to her O2 situation.  PRECAUTIONS: SpO2 not under 90 %  SUBJECTIVE: Back is fine, no pain. Also did the recumbent bike for an hour yesterday. Back bothered  her but improved.     PAIN:  Are you having pain? See subjective       TODAY'S TREATMENT:   06/24/2022   Therapeutic exercise    At start of session:   SpO2 3 L 96%, HR 62   Gait with SPC on L side with pt carrying her supplemental O2 R shoulder strap   100 ft CGA to SBA  SpO2 85%, HR 106 (improves to 98%, HR 63 after rest   130 ft CGA to SBA  SpO2 87%, HR 81 (improves to 97%, HR 64 after rest   Forward stepping over 2 mini hurdles, alternating feet with SPC assist 10x   81 % on 3 L. Increased to 4 L.  (improves to 98%, HR 65 after rest) . 4L O2   Standing B shoulder extension with scapular retraction yellow band 10x3   Gait with SPC over tumble mat with 180 degrees turnaround 3x   87%, HR 72 (improves to 98%, HR 69 after rest)    Then with 2 ankle weights under mat to simulate uneven surface outside. 2x    85%, HR 81 (improves to  97%, HR 65  after rest)   Therapeutic rest breaks utilized for oxygen and muscle recovery.      Improved exercise technique, movement at target joints, use of target muscles after mod verbal, visual, tactile cues.      Response to treatment Pt tolerated session well without aggravation of back pain. O2 increased to 4L during exercises today.         Clinical impression   Continued working on gait with SPC on even and uneven surfaces as well as obstacle negotiation to promote ability to ambulate with less difficulty and decrease fall risk. O2 was increased to 4 L during exercises. Pt currently ambulating with a rollator instead of using wc now. Pt tolerated session well without aggravation of back pain. Pt will benefit from continued skilled physical therapy services to improve ability to ambulate independently and on uneven surfaces, as well as improve activity tolerance and continue to decrease low back pain.        PATIENT EDUCATION: Education details: ther-ex, HEP Person educated: Patient Education method:  Explanation, Demonstration, Tactile cues, Verbal cues, and Handouts Education comprehension: verbalized understanding and returned demonstration   HOME EXERCISE PROGRAM: Access Code: [email or web address] URL: [email or web address] Date: 01/20/2022 Prepared by: Joneen Boers  Exercises - Seated Transversus Abdominis Bracing  - 1 x daily - 7 x weekly - 3 sets - 10 reps - 5 seconds hold - Seated Gluteal Sets  - 1 x daily - 7 x weekly - 3 sets - 10  reps - 5 seconds hold - Discontinued 03/12/2022: Seated Scapular Retraction  - 1 x daily - 7 x weekly - 3 sets - 10 reps - 5 seconds  hold  Can perform aforementioned exercises in standing now.    - Seated Hip Adduction Isometrics with Ball  - 1 x daily - 7 x weekly - 2 sets - 10 reps - 5 seconds hold - Standing Hip Abduction with Counter Support  - 1 x daily - 7 x weekly - 3 sets - 10 reps - Discontinued on 03/09/2022: Seated Hip Abduction with Resistance  - 1 x daily - 7 x weekly - 2 sets - 10 reps  - Supine Posterior Pelvic Tilt  - 3 x daily - 7 x weekly - 3 sets - 10 reps - 5 seconds hold - Hooklying Clamshell with Resistance  - 1 x daily - 7 x weekly - 3 sets - 10 reps - Standing Posterior Pelvic Tilt  - 3 x daily - 7 x weekly - 3 sets - 10 reps - 5 seconds hold   PT Short Term Goals - 02/16/22 0941       PT SHORT TERM GOAL #1   Title Pt will be independent with her initial HEP to decrease pain, improve strength, function, and ability to stand and ambulate more comfortably.    Baseline No questions with HEP, has been doing them (02/16/2022)    Time 3    Period Weeks    Status Achieved    Target Date 02/05/22              PT Long Term Goals - 05/20/22 0926       PT LONG TERM GOAL #1   Title Pt will have a decrease in low back pain to 3/10 or less at worst to promote ability to stand and ambulate with less difficulty.    Baseline 6/10 low back pain at worst for the past 2 months (01/12/2022); 7/10 at most for the past 7 days  (standing in the kitchen, more standing than normal becuase pt had guests over from [facility]) ( 02/16/2022);  6-7/10 back pain at worst for the past 7 days. Back pain at worst goes away faster since starting PT. (03/09/2022). R low back pain has not been bothering her. Thoracic back pain is about a 6/10 at worst for the past 7 days (04/02/2022); 4/10 low back pain at worst for the past 7 days (05/06/2022); 4-5/10 at worst for the past 7 days, comes and goes (05/20/2022)    Time 8    Period Weeks    Status Partially Met    Target Date 07/02/22      PT LONG TERM GOAL #2   Title Pt will have a decrease in R LE pain to 3/10 or less at worst to promote ability to stand and ambulate will less difficulty.    Baseline 9/10 R LE pain at worst (01/12/2022); 3/10 R sciatic and L5 dermatome pain at most for the past 7 days; has R > L heel pain however which wakes her up at night. (02/16/2022); 4/10 R LE pain at most for the past 7 days. R heel pain however wakes her up at night. (03/12/2022), 3/10 R LE pain at worst for the past 7 days (04/02/2022); R heel bothered her last night (possibly plantar fasciitis), 3/10 R LE pain at most for the past 7 days (05/06/2022)    Time 8    Period Weeks  Status Achieved    Target Date 05/07/22      PT LONG TERM GOAL #3   Title Pt will improve her FOTO score by at least 10 points as a demontsration of improved function.    Baseline Lumbar Spine FOTO 39 (01/12/2022); 46 (02/16/2022); 52 (03/12/2022)    Time 8    Period Weeks    Status Achieved    Target Date 05/07/22      PT LONG TERM GOAL #4   Title Pt will be able to ambulate at least 100 ft independently to promote mobility and return to prior level of function.    Baseline Able to ambulate with rw short distance, about 40 ft with reproduction of pain. (01/12/2022); able to ambulate 120 ft with rw, SBA (02/16/2022); SPC 40 ft, CGA (03/12/2022); Pt able to ambulate 88 ft with SPC on L side (03/31/2022); 210 ft with SPC on L side, rw  200 ft, no pain (05/06/2022); SPC on L side 200 ft, then no AD but CGA 100 ft x 2 with rest break in between bouts, no back pain reported (05/20/2022)    Time 8    Period Weeks    Status Partially Met    Target Date 07/02/22              Plan - 06/24/22 1502     Clinical Impression Statement Continued working on gait with SPC on even and uneven surfaces as well as obstacle negotiation to promote ability to ambulate with less difficulty and decrease fall risk. O2 was increased to 4 L during exercises. Pt currently ambulating with a rollator instead of using wc now. Pt tolerated session well without aggravation of back pain. Pt will benefit from continued skilled physical therapy services to improve ability to ambulate independently and on uneven surfaces, as well as improve activity tolerance and continue to decrease low back pain.    Personal Factors and Comorbidities Comorbidity 3+;Age;Time since onset of injury/illness/exacerbation;Fitness    Comorbidities Arthritis, atrial fibrillation, HTN, idiopathic pulmonary fibrosis    Examination-Activity Limitations Stairs;Bed Mobility;Stand;Sit;Lift;Transfers;Locomotion Level;Carry    Stability/Clinical Decision Making Stable/Uncomplicated    Clinical Decision Making Low    Rehab Potential Fair    PT Frequency 2x / week    PT Duration 8 weeks    PT Treatment/Interventions Therapeutic activities;Therapeutic exercise;Gait training;Balance training;Neuromuscular re-education;Patient/family education;Manual techniques;Dry needling;Electrical Stimulation;Iontophoresis 20m/ml Dexamethasone    PT Next Visit Plan Posture, thoracic extension, trunk and glute strength, lumbopelvic control during gait, manual techniques, modalities PRN    PT Home Exercise Plan Medbridge Access Code: [email or web address]   Consulted and Agree with Plan of Care Patient                                       MJoneen BoersPT, DPT  06/24/2022, 5:30 PM

## 2022-06-29 ENCOUNTER — Ambulatory Visit: Payer: Medicare PPO

## 2022-06-29 DIAGNOSIS — M5431 Sciatica, right side: Secondary | ICD-10-CM

## 2022-06-29 DIAGNOSIS — M5417 Radiculopathy, lumbosacral region: Secondary | ICD-10-CM

## 2022-06-29 DIAGNOSIS — Z9181 History of falling: Secondary | ICD-10-CM

## 2022-06-29 DIAGNOSIS — M6281 Muscle weakness (generalized): Secondary | ICD-10-CM

## 2022-06-29 DIAGNOSIS — M5459 Other low back pain: Secondary | ICD-10-CM

## 2022-06-29 DIAGNOSIS — R262 Difficulty in walking, not elsewhere classified: Secondary | ICD-10-CM

## 2022-06-29 NOTE — Therapy (Signed)
OUTPATIENT PHYSICAL THERAPY TREATMENT NOTE        Patient Name: Joyce Evans MRN: [id number] DOB:1940-08-10, 81 y.o., female Today's Date: 06/29/2022  PCP: Derinda Late, MD  REFERRING PROVIDER: Leonie Green, NP   PT End of Session - 06/29/22 1633     Visit Number 39    Number of Visits 2    Date for PT Re-Evaluation 07/02/22    Authorization Type [employer]    Authorization Time Period 05/06/22-07/02/22    Progress Note Due on Visit 40    PT Start Time 1633    PT Stop Time 1712    PT Time Calculation (min) 39 min    Equipment Utilized During Treatment Oxygen    Activity Tolerance Patient tolerated treatment well;No increased pain    Behavior During Therapy WFL for tasks assessed/performed                                             Past Medical History:  Diagnosis Date   Actinic keratosis    Arthritis    osteo - shoulders, fingers   Atrial fibrillation (HCC)    Dizziness    thinks because of diuretic   Family history of adverse reaction to anesthesia    daughter -PONV   Gallstones 2016, 2017   GERD (gastroesophageal reflux disease)    Glaucoma    Heart murmur    history of   Hypertension    Melanoma ([place]) 04/20/2016   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 Excised: 05/19/2016, margins free.   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   Tricuspid valve disorder    leak   Past Surgical History:  Procedure Laterality Date   ABDOMINAL HYSTERECTOMY  1987   partial   CARDIAC CATHETERIZATION  2010   [facility]   CATARACT EXTRACTION W/PHACO Right 08/28/2015   Procedure: CATARACT EXTRACTION PHACO AND INTRAOCULAR LENS PLACEMENT ([place]);  Surgeon: Leandrew Koyanagi, MD;  Location: [place];  Service: Ophthalmology;  Laterality: Right;  TORIC   INTRAMEDULLARY (IM)  NAIL INTERTROCHANTERIC Right 10/16/2021   Procedure: INTRAMEDULLARY (IM) NAIL INTERTROCHANTRIC;  Surgeon: Renee Harder, MD;  Location: [facility];  Service: Orthopedics;  Laterality: Right;   MELANOMA EXCISION Left 05/19/2016   MIS. Left mid. lat. tricep. 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 S/P IM nail placement for right intertrochanteric hip fracture with Dr. Sharlet Salina on 10/16/2021. Pt slipped on the kithen floor. Pt was getting something from the microwave and pt tripped on her O2 tube. Pt was independent with ambulation prior to her fall. Pt fell before in the woods and pt tripped over a root 3  years ago (no injuries per pt). Pt has been using supplemental O2 for the past 2 years in November. Pt has interstitial lung disease (idiopathic pulmonary fibrosis). Recent breathing test is stable. Back pain began when pt was in Rehab in [place] [facility]. Feels like it is sciatical. Pt radiates along the sciatic nerve and R L 5 dermatome. Pt is healing fine as far as her R hip surgery is concerned. Also has R medial knee pain due to arthritis. Had home health PT which involves seated knee extension, hip flexion, standing hip abduction, extension. Pt did some walking wiht a rw. Feels a lot of pain all over. Walking too far is limited due to her O2 situation.  PRECAUTIONS: SpO2 not under 90 %  SUBJECTIVE: stopped using her wc.  Feels like PT helps. Wants to do another month. Mid thoracic  spine is currently bothering her, 5/10 currently. No R low back pain currently, 4/10 low back pain at worst for the past 7 days. No R LE pain for the past 7 days. Might do pulmonary rehab in the future.     PAIN:  Are you having pain? See subjective       TODAY'S TREATMENT:   06/29/2022   Therapeutic exercise    At start of session:   SpO2 4 L 98%, HR 49   Gait with SPC on L side with pt carrying her supplemental O2 R shoulder strap  4L O2  100 ft CGA to SBA  SpO2 91%, HR 76 (improves to 97%, HR 59 after rest)    130 ft CGA to SBA  SpO2 89%, HR 73 (improves to 99%, HR 58 after rest   Seated B scapular retraction 10x5 seconds for 3 sets  Seated manually resisted L lateral shift isometrics 10x5 seconds   Seated manually resisted trunk flexion isometrics to improve core strength in neutral 10x5 seconds   Decreased upper lumbar pain.   Seated chin tucks 10x5 seconds for 3 sets  To promote thoracic extension   96%, 59 HR  Therapeutic rest breaks utilized for oxygen and muscle recovery.      Improved exercise technique, movement at target joints, use of target muscles after mod verbal, visual, tactile cues.      Response to treatment Pt tolerated session well without aggravation of low back pain. Decreased upper back pain during session.      Clinical impression  Pt used 4 L O2 today. Continued working on gait with SPC to improve ability to ambulate with least restrictive AD.  Worked on thoracic extension to decrease kyphosis posture and stress to upper back. Decreased upper back pain during session but returned at the end of session. Pt overall making very good progress with decreased low back and R LE pain based on subjective reports. Pt will benefit from continued skilled physical therapy services to improve ability to ambulate on uneven surfaces, as well as improve activity tolerance and continue to decrease low back pain.        PATIENT  EDUCATION: Education details: ther-ex, HEP Person educated: Patient Education method: Explanation, Demonstration, Tactile cues, Verbal cues, and Handouts Education comprehension: verbalized understanding and returned demonstration   HOME EXERCISE PROGRAM: Access Code: [email or web address] URL: [email or web address] Date: 01/20/2022 Prepared by: Joneen Boers  Exercises - Seated Transversus Abdominis Bracing  - 1 x daily - 7 x weekly - 3 sets - 10 reps - 5 seconds hold - Seated Gluteal Sets  - 1 x daily - 7 x weekly - 3  sets - 10 reps - 5 seconds hold - Continued again 06/29/2022: Seated Scapular Retraction  - 1 x daily - 7 x weekly - 3 sets - 10 reps - 5 seconds  hold  Can perform aforementioned exercises in standing now.    - Seated Hip Adduction Isometrics with Ball  - 1 x daily - 7 x weekly - 2 sets - 10 reps - 5 seconds hold - Standing Hip Abduction with Counter Support  - 1 x daily - 7 x weekly - 3 sets - 10 reps - Discontinued on 03/09/2022: Seated Hip Abduction with Resistance  - 1 x daily - 7 x weekly - 2 sets - 10 reps  - Supine Posterior Pelvic Tilt  - 3 x daily - 7 x weekly - 3 sets - 10 reps - 5 seconds hold - Hooklying Clamshell with Resistance  - 1 x daily - 7 x weekly - 3 sets - 10 reps - Standing Posterior Pelvic Tilt  - 3 x daily - 7 x weekly - 3 sets - 10 reps - 5 seconds hold - Seated Passive Cervical Retraction  - 1 x daily - 7 x weekly - 3 sets - 10 reps - 5 seconds hold      PT Short Term Goals - 02/16/22 0941       PT SHORT TERM GOAL #1   Title Pt will be independent with her initial HEP to decrease pain, improve strength, function, and ability to stand and ambulate more comfortably.    Baseline No questions with HEP, has been doing them (02/16/2022)    Time 3    Period Weeks    Status Achieved    Target Date 02/05/22              PT Long Term Goals - 05/20/22 0926       PT LONG TERM GOAL #1   Title Pt will have a decrease in low back pain to  3/10 or less at worst to promote ability to stand and ambulate with less difficulty.    Baseline 6/10 low back pain at worst for the past 2 months (01/12/2022); 7/10 at most for the past 7 days (standing in the kitchen, more standing than normal becuase pt had guests over from [facility]) ( 02/16/2022);  6-7/10 back pain at worst for the past 7 days. Back pain at worst goes away faster since starting PT. (03/09/2022). R low back pain has not been bothering her. Thoracic back pain is about a 6/10 at worst for the past 7 days (04/02/2022); 4/10 low back pain at worst for the past 7 days (05/06/2022); 4-5/10 at worst for the past 7 days, comes and goes (05/20/2022)    Time 8    Period Weeks    Status Partially Met    Target Date 07/02/22      PT LONG TERM GOAL #2   Title Pt will have a decrease in R LE pain to 3/10 or less at worst to promote ability to stand and ambulate will less difficulty.    Baseline 9/10 R LE pain at worst (01/12/2022); 3/10 R sciatic and L5 dermatome pain at most for the past 7 days; has R > L heel pain however which wakes her up at night. (02/16/2022); 4/10 R LE pain at most for the past 7 days. R heel pain however wakes her up at night. (03/12/2022), 3/10 R LE pain at worst for the past 7 days (04/02/2022); R heel bothered  her last night (possibly plantar fasciitis), 3/10 R LE pain at most for the past 7 days (05/06/2022)    Time 8    Period Weeks    Status Achieved    Target Date 05/07/22      PT LONG TERM GOAL #3   Title Pt will improve her FOTO score by at least 10 points as a demontsration of improved function.    Baseline Lumbar Spine FOTO 39 (01/12/2022); 46 (02/16/2022); 52 (03/12/2022)    Time 8    Period Weeks    Status Achieved    Target Date 05/07/22      PT LONG TERM GOAL #4   Title Pt will be able to ambulate at least 100 ft independently to promote mobility and return to prior level of function.    Baseline Able to ambulate with rw short distance, about 40 ft with  reproduction of pain. (01/12/2022); able to ambulate 120 ft with rw, SBA (02/16/2022); SPC 40 ft, CGA (03/12/2022); Pt able to ambulate 88 ft with SPC on L side (03/31/2022); 210 ft with SPC on L side, rw 200 ft, no pain (05/06/2022); SPC on L side 200 ft, then no AD but CGA 100 ft x 2 with rest break in between bouts, no back pain reported (05/20/2022)    Time 8    Period Weeks    Status Partially Met    Target Date 07/02/22              Plan - 06/29/22 1632     Clinical Impression Statement Pt used 4 L O2 today. Continued working on gait with SPC to improve ability to ambulate with least restrictive AD.  Worked on thoracic extension to decrease kyphosis posture and stress to upper back. Decreased upper back pain during session but returned at the end of session. Pt overall making very good progress with decreased low back and R LE pain based on subjective reports. Pt will benefit from continued skilled physical therapy services to improve ability to ambulate on uneven surfaces, as well as improve activity tolerance and continue to decrease low back pain.    Personal Factors and Comorbidities Comorbidity 3+;Age;Time since onset of injury/illness/exacerbation;Fitness    Comorbidities Arthritis, atrial fibrillation, HTN, idiopathic pulmonary fibrosis    Examination-Activity Limitations Stairs;Bed Mobility;Stand;Sit;Lift;Transfers;Locomotion Level;Carry    Stability/Clinical Decision Making Stable/Uncomplicated    Rehab Potential Fair    PT Frequency 2x / week    PT Duration 8 weeks    PT Treatment/Interventions Therapeutic activities;Therapeutic exercise;Gait training;Balance training;Neuromuscular re-education;Patient/family education;Manual techniques;Dry needling;Electrical Stimulation;Iontophoresis 84m/ml Dexamethasone    PT Next Visit Plan Posture, thoracic extension, trunk and glute strength, lumbopelvic control during gait, manual techniques, modalities PRN    PT Home Exercise Plan Medbridge  Access Code: [email or web address]   Consulted and Agree with Plan of Care Patient                                        MJoneen BoersPT, DPT  06/29/2022, 5:24 PM

## 2022-07-02 ENCOUNTER — Ambulatory Visit: Payer: Medicare PPO

## 2022-07-02 DIAGNOSIS — M6281 Muscle weakness (generalized): Secondary | ICD-10-CM

## 2022-07-02 DIAGNOSIS — Z9181 History of falling: Secondary | ICD-10-CM

## 2022-07-02 DIAGNOSIS — M5459 Other low back pain: Secondary | ICD-10-CM | POA: Diagnosis not present

## 2022-07-02 DIAGNOSIS — R262 Difficulty in walking, not elsewhere classified: Secondary | ICD-10-CM

## 2022-07-02 DIAGNOSIS — M5417 Radiculopathy, lumbosacral region: Secondary | ICD-10-CM

## 2022-07-02 DIAGNOSIS — M5431 Sciatica, right side: Secondary | ICD-10-CM

## 2022-07-02 NOTE — Addendum Note (Signed)
Addended by: Madaline Savage on: 07/02/2022 05:05 PM   Modules accepted: Orders

## 2022-07-02 NOTE — Therapy (Addendum)
OUTPATIENT PHYSICAL THERAPY TREATMENT NOTE  Re-certification and Progress Report  (05/20/2022 - 07/02/2022)    Patient Name: Joyce Evans MRN: [id number] DOB:1941/06/03, 81 y.o., female Today's Date: 07/02/2022  PCP: Derinda Late, MD  REFERRING PROVIDER: Leonie Green, NP   PT End of Session - 07/02/22 1502     Visit Number 40    Number of Visits 26    Date for PT Re-Evaluation 08/27/22    Authorization Type [employer]    Authorization Time Period 05/06/22-07/02/22    Progress Note Due on Visit 40    PT Start Time 1503    PT Stop Time 1545    PT Time Calculation (min) 42 min    Equipment Utilized During Treatment Oxygen    Activity Tolerance Patient tolerated treatment well;No increased pain    Behavior During Therapy WFL for tasks assessed/performed                                              Past Medical History:  Diagnosis Date   Actinic keratosis    Arthritis    osteo - shoulders, fingers   Atrial fibrillation (HCC)    Dizziness    thinks because of diuretic   Family history of adverse reaction to anesthesia    daughter -PONV   Gallstones 2016, 2017   GERD (gastroesophageal reflux disease)    Glaucoma    Heart murmur    history of   Hypertension    Melanoma ([place]) 04/20/2016   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 Excised: 05/19/2016, margins free.   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   Tricuspid valve disorder    leak   Past Surgical History:  Procedure Laterality Date   ABDOMINAL HYSTERECTOMY  1987   partial   CARDIAC CATHETERIZATION  2010   [facility]   CATARACT EXTRACTION W/PHACO Right 08/28/2015   Procedure: CATARACT EXTRACTION PHACO AND INTRAOCULAR LENS PLACEMENT ([place]);  Surgeon: Leandrew Koyanagi, MD;  Location: [facility];  Service:  Ophthalmology;  Laterality: Right;  TORIC   INTRAMEDULLARY (IM) NAIL INTERTROCHANTERIC Right 10/16/2021   Procedure: INTRAMEDULLARY (IM) NAIL INTERTROCHANTRIC;  Surgeon: Renee Harder, MD;  Location: [facility];  Service: Orthopedics;  Laterality: Right;   MELANOMA EXCISION Left 05/19/2016   MIS. Left mid. lat. tricep. 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 S/P IM nail placement for right intertrochanteric hip  fracture with Dr. Sharlet Salina on 10/16/2021. Pt slipped on the kithen floor. Pt was getting something from the microwave and pt tripped on her O2 tube. Pt was independent with ambulation prior to her fall. Pt fell before in the woods and pt tripped over a root 3 years ago (no injuries per pt). Pt has been using supplemental O2 for the past 2 years in November. Pt has interstitial lung disease (idiopathic pulmonary fibrosis). Recent breathing test is stable. Back pain began when pt was in Rehab in [place] [facility]. Feels like it is sciatical. Pt radiates along the sciatic nerve and R L 5 dermatome. Pt is healing fine as far as her R hip surgery is concerned. Also has R medial knee pain due to arthritis. Had home health PT which involves seated knee extension, hip flexion,  standing hip abduction, extension. Pt did some walking wiht a rw. Feels a lot of pain all over. Walking too far is limited due to her O2 situation.  PRECAUTIONS: SpO2 not under 90 %  SUBJECTIVE: low back and mid back are fine currently. Lays down and rests for a few minutes when her back bothers her which helps. Took her [facility] while going out to dinner 06/30/2022 for her birthday and stood a lot and did well. Has also been using her recumbent bike. Pretty much ditched the wc now. Wants to continue PT to February to bridge to her pulmonary appointment.      PAIN:  Are you having pain? See subjective       TODAY'S TREATMENT:   07/02/2022   Therapeutic exercise    At start of session:   SpO2 4 L 97%, HR 67  Gait 100 ft without using AD (just holding the SPC) SBA  89, HR 98 (improves to  93+ %  after rest)   Gait 100 ft without using AD (just holding the SPC) SBA  86, HR 77 (improves to  98 %, HR 71  after rest)    Side stepping 20 ft to the R and 20 ft to the L  without AD   83% HR 101, improves to  97%, HR 77  after rest  R knee discomfort with side stepping to the L    reviewed POC: continue 2x/week for 8 weeks to continue progress   Standing with B UE assist                   Hip abduction                           with red band around ankles                                                 R 10x3  (94%, Hr 77, improves to 98%, HR 59 after rest)    Mini squats with B foot on R and L Dyna discs (uneven surface) 5x3   ([email or web address] and Dyna Disc)   First set 92%, HR 70 (improves to  97% 62 after rest)    Second set: 92%, HR 61 (improves to  97%, HR 73  after rest)   Third set: 95%, HR 87     Therapeutic rest breaks utilized for oxygen and muscle recovery.      Improved exercise technique, movement at target joints,  use of target muscles after mod verbal, visual, tactile cues.      Response to treatment Pt tolerated session well without aggravation of low  back pain.       Clinical impression  Pt used 4 L O2 today. R low back pain stabilizing to 4/10 at worst. Demonstrates mid back pain which increases to 5/10 at most for the past 7 days. No R LE pain reported for the past 7 days on 06/29/2022. Pt also improving ability to ambulate on level surface without use of AD. Overall, pt making progress with PT towards goals. Challenges to progress include chronicity of symptoms and pulmonary fibrosis. Pt will benefit from continued skilled physical therapy services to improve ability to ambulate on uneven surfaces, as well as improve activity tolerance and continue to decrease low back pain.        PATIENT EDUCATION: Education details: ther-ex, HEP Person educated: Patient Education method: Explanation, Demonstration, Tactile cues, Verbal cues, and Handouts Education comprehension: verbalized understanding and returned demonstration   HOME EXERCISE PROGRAM: Access Code: [email or web address] URL: [email or web address] Date: 01/20/2022 Prepared by: Joneen Boers  Exercises - Seated Transversus Abdominis Bracing  - 1 x daily - 7 x weekly - 3 sets - 10 reps - 5 seconds hold - Seated Gluteal Sets  - 1 x daily - 7 x weekly - 3 sets - 10 reps - 5 seconds hold - Continued again 06/29/2022: Seated Scapular Retraction  - 1 x daily - 7 x weekly - 3 sets - 10 reps - 5 seconds  hold  Can perform aforementioned exercises in standing now.    - Seated Hip Adduction Isometrics with Ball  - 1 x daily - 7 x weekly - 2 sets - 10 reps - 5 seconds hold - Standing Hip Abduction with Counter Support  - 1 x daily - 7 x weekly - 3 sets - 10 reps - Discontinued on 03/09/2022: Seated Hip Abduction with Resistance  - 1 x daily - 7 x weekly - 2 sets - 10 reps  - Supine Posterior Pelvic Tilt  - 3 x daily - 7 x weekly - 3 sets - 10 reps - 5 seconds hold - Hooklying Clamshell with Resistance  - 1 x daily - 7 x weekly - 3 sets - 10 reps - Standing Posterior Pelvic Tilt   - 3 x daily - 7 x weekly - 3 sets - 10 reps - 5 seconds hold - Seated Passive Cervical Retraction  - 1 x daily - 7 x weekly - 3 sets - 10 reps - 5 seconds hold      PT Short Term Goals - 02/16/22 0941       PT SHORT TERM GOAL #1   Title Pt will be independent with her initial HEP to decrease pain, improve strength, function, and ability to stand and ambulate more comfortably.    Baseline No questions with HEP, has been doing them (02/16/2022)    Time 3    Period Weeks    Status Achieved    Target Date 02/05/22              PT Long Term Goals - 07/02/22 1508       PT LONG TERM GOAL #1   Title Pt will have a decrease in low back pain to 3/10 or less at worst to promote ability to stand and ambulate with less difficulty.    Baseline 6/10 low back pain at worst for the past 2 months (01/12/2022);  7/10 at most for the past 7 days (standing in the kitchen, more standing than normal becuase pt had guests over from [facility]) ( 02/16/2022);  6-7/10 back pain at worst for the past 7 days. Back pain at worst goes away faster since starting PT. (03/09/2022). R low back pain has not been bothering her. Thoracic back pain is about a 6/10 at worst for the past 7 days (04/02/2022); 4/10 low back pain at worst for the past 7 days (05/06/2022); 4-5/10 at worst for the past 7 days, comes and goes (05/20/2022); 4/10 R low back at most for the past 7 days, 5/10 thoracic back pain at most for the past 7 days (07/02/2022)    Time 8    Period Weeks    Status Partially Met    Target Date 08/27/22      PT LONG TERM GOAL #2   Title Pt will have a decrease in R LE pain to 3/10 or less at worst to promote ability to stand and ambulate will less difficulty.    Baseline 9/10 R LE pain at worst (01/12/2022); 3/10 R sciatic and L5 dermatome pain at most for the past 7 days; has R > L heel pain however which wakes her up at night. (02/16/2022); 4/10 R LE pain at most for the past 7 days. R heel pain however wakes her up  at night. (03/12/2022), 3/10 R LE pain at worst for the past 7 days (04/02/2022); R heel bothered her last night (possibly plantar fasciitis), 3/10 R LE pain at most for the past 7 days (05/06/2022)    Time 8    Period Weeks    Status Achieved    Target Date 05/07/22      PT LONG TERM GOAL #3   Title Pt will improve her FOTO score by at least 10 points as a demontsration of improved function.    Baseline Lumbar Spine FOTO 39 (01/12/2022); 46 (02/16/2022); 52 (03/12/2022); 49 (06/22/2022)    Time 8    Period Weeks    Status Achieved    Target Date 05/07/22      PT LONG TERM GOAL #4   Title Pt will be able to ambulate at least 100 ft independently to promote mobility and return to prior level of function.    Baseline Able to ambulate with rw short distance, about 40 ft with reproduction of pain. (01/12/2022); able to ambulate 120 ft with rw, SBA (02/16/2022); SPC 40 ft, CGA (03/12/2022); Pt able to ambulate 88 ft with SPC on L side (03/31/2022); 210 ft with SPC on L side, rw 200 ft, no pain (05/06/2022); SPC on L side 200 ft, then no AD but CGA 100 ft x 2 with rest break in between bouts, no back pain reported (05/20/2022); No AD, 100 ft, SBA (07/02/2022)    Time 8    Period Weeks    Status Partially Met    Target Date 08/27/22              Plan - 07/02/22 1456     Clinical Impression Statement Pt used 4 L O2 today. R low back pain stabilizing to 4/10 at worst. Demonstrates mid back pain which increases to 5/10 at most for the past 7 days. No R LE pain reported for the past 7 days on 06/29/2022. Pt also improving ability to ambulate on level surface without use of AD. Overall, pt making progress with PT towards goals. Challenges to progress include chronicity of symptoms  and pulmonary fibrosis. Pt will benefit from continued skilled physical therapy services to improve ability to ambulate on uneven surfaces, as well as improve activity tolerance and continue to decrease low back pain.    Personal  Factors and Comorbidities Comorbidity 3+;Age;Time since onset of injury/illness/exacerbation;Fitness    Comorbidities Arthritis, atrial fibrillation, HTN, idiopathic pulmonary fibrosis    Examination-Activity Limitations Stairs;Bed Mobility;Stand;Sit;Lift;Transfers;Locomotion Level;Carry    Stability/Clinical Decision Making Stable/Uncomplicated    Clinical Decision Making Low    Rehab Potential Fair    PT Frequency 2x / week    PT Duration 8 weeks    PT Treatment/Interventions Therapeutic activities;Therapeutic exercise;Gait training;Balance training;Neuromuscular re-education;Patient/family education;Manual techniques;Dry needling;Electrical Stimulation;Iontophoresis 83m/ml Dexamethasone    PT Next Visit Plan Posture, thoracic extension, trunk and glute strength, lumbopelvic control during gait, manual techniques, modalities PRN    PT Home Exercise Plan Medbridge Access Code: [email or web address]   Consulted and Agree with Plan of Care Patient                                         MJoneen BoersPT, DPT  07/02/2022, 5:05 PM

## 2022-07-08 ENCOUNTER — Ambulatory Visit: Payer: Medicare PPO

## 2022-07-08 DIAGNOSIS — M5417 Radiculopathy, lumbosacral region: Secondary | ICD-10-CM

## 2022-07-08 DIAGNOSIS — M5431 Sciatica, right side: Secondary | ICD-10-CM

## 2022-07-08 DIAGNOSIS — M6281 Muscle weakness (generalized): Secondary | ICD-10-CM

## 2022-07-08 DIAGNOSIS — M5459 Other low back pain: Secondary | ICD-10-CM | POA: Diagnosis not present

## 2022-07-08 DIAGNOSIS — R262 Difficulty in walking, not elsewhere classified: Secondary | ICD-10-CM

## 2022-07-08 DIAGNOSIS — Z9181 History of falling: Secondary | ICD-10-CM

## 2022-07-08 NOTE — Therapy (Signed)
OUTPATIENT PHYSICAL THERAPY TREATMENT NOTE    Patient Name: Joyce Evans MRN: [id number] DOB:12-06-1940, 81 y.o., female Today's Date: 07/08/2022  PCP: Derinda Late, MD  REFERRING PROVIDER: Leonie Green, NP   PT End of Session - 07/08/22 1503     Visit Number 41    Number of Visits 15    Date for PT Re-Evaluation 08/27/22    Authorization Type [employer]    Authorization Time Period 05/06/22-07/02/22    Progress Note Due on Visit 40    PT Start Time 1503    PT Stop Time 1544    PT Time Calculation (min) 41 min    Equipment Utilized During Treatment Oxygen    Activity Tolerance Patient tolerated treatment well;No increased pain    Behavior During Therapy WFL for tasks assessed/performed                                               Past Medical History:  Diagnosis Date   Actinic keratosis    Arthritis    osteo - shoulders, fingers   Atrial fibrillation (HCC)    Dizziness    thinks because of diuretic   Family history of adverse reaction to anesthesia    daughter -PONV   Gallstones 2016, 2017   GERD (gastroesophageal reflux disease)    Glaucoma    Heart murmur    history of   Hypertension    Melanoma ([place]) 04/20/2016   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 Excised: 05/19/2016, margins free.   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   Tricuspid valve disorder    leak   Past Surgical History:  Procedure Laterality Date   ABDOMINAL HYSTERECTOMY  1987   partial   CARDIAC CATHETERIZATION  2010   [facility]   CATARACT EXTRACTION W/PHACO Right 08/28/2015   Procedure: CATARACT EXTRACTION PHACO AND INTRAOCULAR LENS PLACEMENT ([place]);  Surgeon: Leandrew Koyanagi, MD;  Location: [place];  Service: Ophthalmology;  Laterality: Right;  TORIC   INTRAMEDULLARY (IM) NAIL  INTERTROCHANTERIC Right 10/16/2021   Procedure: INTRAMEDULLARY (IM) NAIL INTERTROCHANTRIC;  Surgeon: Renee Harder, MD;  Location: [facility];  Service: Orthopedics;  Laterality: Right;   MELANOMA EXCISION Left 05/19/2016   MIS. Left mid. lat. tricep. 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 S/P IM nail placement for right intertrochanteric hip fracture with Dr. Sharlet Salina on 10/16/2021. Pt slipped on the kithen floor. Pt was getting something from the microwave and pt tripped on her O2 tube. Pt was independent with ambulation prior to her fall. Pt fell before in the woods and pt tripped over a root 3 years ago (  no injuries per pt). Pt has been using supplemental O2 for the past 2 years in November. Pt has interstitial lung disease (idiopathic pulmonary fibrosis). Recent breathing test is stable. Back pain began when pt was in Rehab in [place] [facility]. Feels like it is sciatical. Pt radiates along the sciatic nerve and R L 5 dermatome. Pt is healing fine as far as her R hip surgery is concerned. Also has R medial knee pain due to arthritis. Had home health PT which involves seated knee extension, hip flexion, standing hip abduction, extension. Pt did some walking wiht a rw. Feels a lot of pain all over. Walking too far is limited due to her O2 situation.  PRECAUTIONS: SpO2 not under 90 %  SUBJECTIVE: Has been doing a lot of walking. Low back did pretty good. Bothered her a little bit but not  a whole lot. Has been coughing a lot. Next pulmonology appointment is in February. Going to call her pulmonologist. R heel no longer gives her problems.      PAIN:  Are you having pain? See subjective       TODAY'S TREATMENT:   07/08/2022   Therapeutic exercise    At start of session:   SpO2 4 L 97%, HR 59  Gait 120 ft without using AD (just holding the SPC) SBA  85, HR 81 (improves to  98%, HR 65 after rest)   Standing B scapular retraction red band 10x3  94%, HR 78 (improves to 97%, HR 63 after rest)  Stepping over 2 mini hurdles with one UE assist 10x2  Set 1 (88% HR 91, improves to 98%, HR 73 after rest)    Set 2 ( 84%, HR 136, improves to 99%, HR 73 after rest)   Pt states she has the tendency to hold her breath  Seated hip ER   R 10x, then 5x3 with 5 second holds  Seated hip adduction small green ball squeeze isometrics 10x5 seconds for 3 sets   Standing with B UE assist                   Hip abduction                           with red band around ankles                                                 R 10x2      94%, HR 103 (improves to  98%, HR 63  after rest)   Therapeutic rest breaks utilized for oxygen and muscle recovery.      Improved exercise technique, movement at target joints, use of target muscles after mod verbal, visual, tactile cues.      Response to treatment Pt tolerated session well without aggravation of low back pain.       Clinical impression  Pt used 4 L O2 today. Continued working on gait with least restrictive AD to promote mobility. Continued with stepping over mini obstacles to improve balance and decrease fall risk. Continued working on improving hip strength to promote R femoral control and decrease stress to R knee. Pt tolerated session well without aggravation of pain. Challenges to progress include pulmonary fibrosis which limit her activity tolerance.  Pt will benefit from continued skilled physical therapy  services to improve ability to ambulate and perform standing tasks with less difficulty as well as to continue to decrease low back pain.      PATIENT EDUCATION: Education details: ther-ex, HEP Person educated: Patient Education method: Explanation, Demonstration, Tactile cues, Verbal cues, and Handouts Education comprehension: verbalized understanding and returned demonstration   HOME EXERCISE PROGRAM: Access Code: [email or web address] URL: [email or web address] Date: 01/20/2022 Prepared by: Joneen Boers  Exercises - Seated Transversus Abdominis Bracing  - 1 x daily - 7 x weekly - 3 sets - 10 reps - 5 seconds hold - Seated Gluteal Sets  - 1 x daily - 7 x weekly - 3 sets - 10 reps - 5 seconds hold - Continued again 06/29/2022: Seated Scapular Retraction  - 1 x daily - 7 x weekly - 3 sets - 10 reps - 5 seconds  hold  Can perform aforementioned exercises in standing now.    - Seated Hip Adduction Isometrics with Ball  - 1 x daily - 7 x weekly - 2 sets - 10 reps - 5 seconds hold - Standing Hip Abduction with Counter Support  - 1 x daily - 7 x weekly - 3 sets - 10 reps - Discontinued on 03/09/2022: Seated Hip Abduction with Resistance  - 1 x daily - 7 x weekly - 2 sets - 10 reps  - Supine Posterior Pelvic Tilt  - 3 x daily - 7 x weekly - 3 sets - 10 reps - 5 seconds hold - Hooklying Clamshell with Resistance  - 1 x daily - 7 x weekly - 3 sets - 10 reps - Standing Posterior Pelvic Tilt  - 3 x daily - 7 x weekly - 3 sets - 10 reps - 5 seconds hold - Seated Passive Cervical Retraction  - 1 x daily - 7 x weekly - 3 sets - 10 reps - 5 seconds hold      PT Short Term Goals - 02/16/22 0941       PT SHORT TERM GOAL #1   Title Pt will be independent with her initial HEP to decrease pain, improve strength, function, and ability to stand and ambulate more comfortably.    Baseline No questions with HEP, has been doing them (02/16/2022)    Time 3    Period Weeks    Status Achieved     Target Date 02/05/22              PT Long Term Goals - 07/02/22 1508       PT LONG TERM GOAL #1   Title Pt will have a decrease in low back pain to 3/10 or less at worst to promote ability to stand and ambulate with less difficulty.    Baseline 6/10 low back pain at worst for the past 2 months (01/12/2022); 7/10 at most for the past 7 days (standing in the kitchen, more standing than normal becuase pt had guests over from [facility]) ( 02/16/2022);  6-7/10 back pain at worst for the past 7 days. Back pain at worst goes away faster since starting PT. (03/09/2022). R low back pain has not been bothering her. Thoracic back pain is about a 6/10 at worst for the past 7 days (04/02/2022); 4/10 low back pain at worst for the past 7 days (05/06/2022); 4-5/10 at worst for the past 7 days, comes and goes (05/20/2022); 4/10 R low back at most for the past 7 days, 5/10 thoracic back pain at most for the past 7 days (07/02/2022)  Time 8    Period Weeks    Status Partially Met    Target Date 08/27/22      PT LONG TERM GOAL #2   Title Pt will have a decrease in R LE pain to 3/10 or less at worst to promote ability to stand and ambulate will less difficulty.    Baseline 9/10 R LE pain at worst (01/12/2022); 3/10 R sciatic and L5 dermatome pain at most for the past 7 days; has R > L heel pain however which wakes her up at night. (02/16/2022); 4/10 R LE pain at most for the past 7 days. R heel pain however wakes her up at night. (03/12/2022), 3/10 R LE pain at worst for the past 7 days (04/02/2022); R heel bothered her last night (possibly plantar fasciitis), 3/10 R LE pain at most for the past 7 days (05/06/2022)    Time 8    Period Weeks    Status Achieved    Target Date 05/07/22      PT LONG TERM GOAL #3   Title Pt will improve her FOTO score by at least 10 points as a demontsration of improved function.    Baseline Lumbar Spine FOTO 39 (01/12/2022); 46 (02/16/2022); 52 (03/12/2022); 49 (06/22/2022)    Time 8     Period Weeks    Status Achieved    Target Date 05/07/22      PT LONG TERM GOAL #4   Title Pt will be able to ambulate at least 100 ft independently to promote mobility and return to prior level of function.    Baseline Able to ambulate with rw short distance, about 40 ft with reproduction of pain. (01/12/2022); able to ambulate 120 ft with rw, SBA (02/16/2022); SPC 40 ft, CGA (03/12/2022); Pt able to ambulate 88 ft with SPC on L side (03/31/2022); 210 ft with SPC on L side, rw 200 ft, no pain (05/06/2022); SPC on L side 200 ft, then no AD but CGA 100 ft x 2 with rest break in between bouts, no back pain reported (05/20/2022); No AD, 100 ft, SBA (07/02/2022)    Time 8    Period Weeks    Status Partially Met    Target Date 08/27/22              Plan - 07/08/22 1502     Clinical Impression Statement Pt used 4 L O2 today. Continued working on gait with least restrictive AD to promote mobility. Continued with stepping over mini obstacles to improve balance and decrease fall risk. Continued working on improving hip strength to promote R femoral control and decrease stress to R knee. Pt tolerated session well without aggravation of pain. Challenges to progress include pulmonary fibrosis which limit her activity tolerance.  Pt will benefit from continued skilled physical therapy services to improve ability to ambulate and perform standing tasks with less difficulty as well as to continue to decrease low back pain.    Personal Factors and Comorbidities Comorbidity 3+;Age;Time since onset of injury/illness/exacerbation;Fitness    Comorbidities Arthritis, atrial fibrillation, HTN, idiopathic pulmonary fibrosis    Examination-Activity Limitations Stairs;Bed Mobility;Stand;Sit;Lift;Transfers;Locomotion Level;Carry    Stability/Clinical Decision Making Stable/Uncomplicated    Rehab Potential Fair    PT Frequency 2x / week    PT Duration 8 weeks    PT Treatment/Interventions Therapeutic activities;Therapeutic  exercise;Gait training;Balance training;Neuromuscular re-education;Patient/family education;Manual techniques;Dry needling;Electrical Stimulation;Iontophoresis 93m/ml Dexamethasone    PT Next Visit Plan Posture, thoracic extension, trunk and glute strength, lumbopelvic control  during gait, manual techniques, modalities PRN    PT Home Exercise Plan Medbridge Access Code: [email or web address]    Consulted and Agree with Plan of Care Patient                                         Joneen Boers PT, DPT  07/08/2022, 3:49 PM

## 2022-07-15 ENCOUNTER — Ambulatory Visit: Payer: Medicare PPO

## 2022-07-21 ENCOUNTER — Ambulatory Visit: Payer: Medicare PPO

## 2022-07-23 ENCOUNTER — Ambulatory Visit: Payer: Medicare PPO

## 2022-07-27 ENCOUNTER — Ambulatory Visit: Payer: Medicare PPO

## 2022-07-30 ENCOUNTER — Ambulatory Visit: Payer: Medicare PPO

## 2022-08-04 ENCOUNTER — Ambulatory Visit: Payer: Medicare PPO | Attending: Orthopedic Surgery

## 2022-08-04 DIAGNOSIS — R262 Difficulty in walking, not elsewhere classified: Secondary | ICD-10-CM

## 2022-08-04 DIAGNOSIS — M5417 Radiculopathy, lumbosacral region: Secondary | ICD-10-CM | POA: Diagnosis present

## 2022-08-04 DIAGNOSIS — M6281 Muscle weakness (generalized): Secondary | ICD-10-CM | POA: Diagnosis present

## 2022-08-04 DIAGNOSIS — Z9181 History of falling: Secondary | ICD-10-CM | POA: Diagnosis present

## 2022-08-04 DIAGNOSIS — M5431 Sciatica, right side: Secondary | ICD-10-CM | POA: Diagnosis present

## 2022-08-04 DIAGNOSIS — M5459 Other low back pain: Secondary | ICD-10-CM | POA: Insufficient documentation

## 2022-08-04 NOTE — Therapy (Addendum)
OUTPATIENT PHYSICAL THERAPY TREATMENT NOTE    Patient Name: Joyce Evans MRN: [id number] DOB:01-11-1941, 82 y.o., female Today's Date: 08/04/2022  PCP: Derinda Late, MD  REFERRING PROVIDER: Leonie Green, NP   PT End of Session - 08/04/22 1150     Visit Number 42    Number of Visits 24    Date for PT Re-Evaluation 08/27/22    Authorization Type [employer]    Authorization Time Period --    Progress Note Due on Visit 40    PT Start Time 1150    PT Stop Time 1219    PT Time Calculation (min) 29 min    Equipment Utilized During Treatment Oxygen    Activity Tolerance Patient tolerated treatment well;No increased pain    Behavior During Therapy WFL for tasks assessed/performed                                                Past Medical History:  Diagnosis Date   Actinic keratosis    Arthritis    osteo - shoulders, fingers   Atrial fibrillation (HCC)    Dizziness    thinks because of diuretic   Family history of adverse reaction to anesthesia    daughter -PONV   Gallstones 2016, 2017   GERD (gastroesophageal reflux disease)    Glaucoma    Heart murmur    history of   Hypertension    Melanoma ([place]) 04/20/2016   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 Excised: 05/19/2016, margins free.   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   Tricuspid valve disorder    leak   Past Surgical History:  Procedure Laterality Date   ABDOMINAL HYSTERECTOMY  1987   partial   CARDIAC CATHETERIZATION  2010   [facility]   CATARACT EXTRACTION W/PHACO Right 08/28/2015   Procedure: CATARACT EXTRACTION PHACO AND INTRAOCULAR LENS PLACEMENT ([place]);  Surgeon: Leandrew Koyanagi, MD;  Location: [place];  Service: Ophthalmology;  Laterality: Right;  TORIC   INTRAMEDULLARY (IM) NAIL  INTERTROCHANTERIC Right 10/16/2021   Procedure: INTRAMEDULLARY (IM) NAIL INTERTROCHANTRIC;  Surgeon: Renee Harder, MD;  Location: [facility];  Service: Orthopedics;  Laterality: Right;   MELANOMA EXCISION Left 05/19/2016   MIS. Left mid. lat. tricep. 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 S/P IM nail placement for right intertrochanteric hip fracture with Dr. Sharlet Salina on 10/16/2021. Pt slipped on the kithen floor. Pt was getting something from the microwave and pt tripped on her O2 tube. Pt was independent with ambulation prior to her fall. Pt fell before in the woods and pt tripped over a root 3 years  ago (no injuries per pt). Pt has been using supplemental O2 for the past 2 years in November. Pt has interstitial lung disease (idiopathic pulmonary fibrosis). Recent breathing test is stable. Back pain began when pt was in Rehab in [place] [facility]. Feels like it is sciatical. Pt radiates along the sciatic nerve and R L 5 dermatome. Pt is healing fine as far as her R hip surgery is concerned. Also has R medial knee pain due to arthritis. Had home health PT which involves seated knee extension, hip flexion, standing hip abduction, extension. Pt did some walking wiht a rw. Feels a lot of pain all over. Walking too far is limited due to her O2 situation.  PRECAUTIONS: SpO2 not under 90 %  SUBJECTIVE: had Covid. Back feels ok now. Was bothering her a little bit. Was able to do her exercises  for her back. Was also on her bike for an hour. Has been doing her bike most days. 3/10 low back pain currently. Happens when standing for any length of time. Better when she sits down.      PAIN:  Are you having pain? See subjective       TODAY'S TREATMENT:   08/04/2022   Therapeutic exercise    At start of session:   SpO2 4 L 100%, HR 48  Gait 125 ft with SPC L hand CGA to SBA  93%, HR 68 (improves to  98%, HR 65 after rest)   Gait 125 ft without using AD (just holding the SPC) CGA                93%, HR 81 (improves to  98%, HR 65 after rest)   Standing B scapular retraction red band 10x3 with 5 second holds   98%, HR 62   Forward step up onto 4 inch step with B UE assist   L 10x2   99%, HR 77 after 2 sets     Therapeutic rest breaks utilized for oxygen and muscle recovery.      Improved exercise technique, movement at target joints, use of target muscles after mod verbal, visual, tactile cues.      Response to treatment Pt tolerated session well without aggravation of low back pain.       Clinical impression  Lighter session today secondary to pt just returning to PT after having COVID. Continued working on gait, thoracic extension, and LE strengthening to promote function and mobility. O2 saturation on 4 L maintained above 90% today (used to go below 90% after gait). Able to maintain function prior to getting COVID based on today's session. Pt tolerated session well without aggravation of low back pain. Pt will benefit from continued skilled physical therapy services to improve ability to ambulate and perform standing tasks with less difficulty as well as to continue to decrease low back pain.      PATIENT EDUCATION: Education details: ther-ex, HEP Person educated: Patient Education method: Explanation, Demonstration, Tactile cues, Verbal cues, and Handouts Education comprehension: verbalized understanding and returned demonstration   HOME  EXERCISE PROGRAM: Access Code: [email or web address] URL: [email or web address] Date: 01/20/2022 Prepared by: Joneen Boers  Exercises - Seated Transversus Abdominis Bracing  - 1 x daily - 7 x weekly - 3 sets - 10 reps - 5 seconds hold - Seated Gluteal Sets  - 1 x daily - 7 x weekly - 3 sets - 10 reps - 5 seconds hold - Continued again 06/29/2022: Seated Scapular Retraction  - 1 x daily - 7  x weekly - 3 sets - 10 reps - 5 seconds  hold  Can perform aforementioned exercises in standing now.    - Seated Hip Adduction Isometrics with Ball  - 1 x daily - 7 x weekly - 2 sets - 10 reps - 5 seconds hold - Standing Hip Abduction with Counter Support  - 1 x daily - 7 x weekly - 3 sets - 10 reps - Discontinued on 03/09/2022: Seated Hip Abduction with Resistance  - 1 x daily - 7 x weekly - 2 sets - 10 reps  - Supine Posterior Pelvic Tilt  - 3 x daily - 7 x weekly - 3 sets - 10 reps - 5 seconds hold - Hooklying Clamshell with Resistance  - 1 x daily - 7 x weekly - 3 sets - 10 reps - Standing Posterior Pelvic Tilt  - 3 x daily - 7 x weekly - 3 sets - 10 reps - 5 seconds hold - Seated Passive Cervical Retraction  - 1 x daily - 7 x weekly - 3 sets - 10 reps - 5 seconds hold      PT Short Term Goals - 02/16/22 0941       PT SHORT TERM GOAL #1   Title Pt will be independent with her initial HEP to decrease pain, improve strength, function, and ability to stand and ambulate more comfortably.    Baseline No questions with HEP, has been doing them (02/16/2022)    Time 3    Period Weeks    Status Achieved    Target Date 02/05/22              PT Long Term Goals - 07/02/22 1508       PT LONG TERM GOAL #1   Title Pt will have a decrease in low back pain to 3/10 or less at worst to promote ability to stand and ambulate with less difficulty.    Baseline 6/10 low back pain at worst for the past 2 months (01/12/2022); 7/10 at most for the past 7 days (standing in the kitchen, more standing than normal  becuase pt had guests over from [facility]) ( 02/16/2022);  6-7/10 back pain at worst for the past 7 days. Back pain at worst goes away faster since starting PT. (03/09/2022). R low back pain has not been bothering her. Thoracic back pain is about a 6/10 at worst for the past 7 days (04/02/2022); 4/10 low back pain at worst for the past 7 days (05/06/2022); 4-5/10 at worst for the past 7 days, comes and goes (05/20/2022); 4/10 R low back at most for the past 7 days, 5/10 thoracic back pain at most for the past 7 days (07/02/2022)    Time 8    Period Weeks    Status Partially Met    Target Date 08/27/22      PT LONG TERM GOAL #2   Title Pt will have a decrease in R LE pain to 3/10 or less at worst to promote ability to stand and ambulate will less difficulty.    Baseline 9/10 R LE pain at worst (01/12/2022); 3/10 R sciatic and L5 dermatome pain at most for the past 7 days; has R > L heel pain however which wakes her up at night. (02/16/2022); 4/10 R LE pain at most for the past 7 days. R heel pain however wakes her up at night. (03/12/2022), 3/10 R LE pain at worst for the past 7 days (04/02/2022); R heel  bothered her last night (possibly plantar fasciitis), 3/10 R LE pain at most for the past 7 days (05/06/2022)    Time 8    Period Weeks    Status Achieved    Target Date 05/07/22      PT LONG TERM GOAL #3   Title Pt will improve her FOTO score by at least 10 points as a demontsration of improved function.    Baseline Lumbar Spine FOTO 39 (01/12/2022); 46 (02/16/2022); 52 (03/12/2022); 49 (06/22/2022)    Time 8    Period Weeks    Status Achieved    Target Date 05/07/22      PT LONG TERM GOAL #4   Title Pt will be able to ambulate at least 100 ft independently to promote mobility and return to prior level of function.    Baseline Able to ambulate with rw short distance, about 40 ft with reproduction of pain. (01/12/2022); able to ambulate 120 ft with rw, SBA (02/16/2022); SPC 40 ft, CGA (03/12/2022); Pt able to  ambulate 88 ft with SPC on L side (03/31/2022); 210 ft with SPC on L side, rw 200 ft, no pain (05/06/2022); SPC on L side 200 ft, then no AD but CGA 100 ft x 2 with rest break in between bouts, no back pain reported (05/20/2022); No AD, 100 ft, SBA (07/02/2022)    Time 8    Period Weeks    Status Partially Met    Target Date 08/27/22              Plan - 08/04/22 1149     Clinical Impression Statement Lighter session today secondary to pt just returning to PT after having COVID. Continued working on gait, thoracic extension, and LE strengthening to promote function and mobility. O2 saturation on 4 L maintained above 90% today (used to go below 90% after gait). Able to maintain function prior to getting COVID based on today's session. Pt tolerated session well without aggravation of low back pain. Pt will benefit from continued skilled physical therapy services to improve ability to ambulate and perform standing tasks with less difficulty as well as to continue to decrease low back pain.    Personal Factors and Comorbidities Comorbidity 3+;Age;Time since onset of injury/illness/exacerbation;Fitness    Comorbidities Arthritis, atrial fibrillation, HTN, idiopathic pulmonary fibrosis    Examination-Activity Limitations Stairs;Bed Mobility;Stand;Sit;Lift;Transfers;Locomotion Level;Carry    Stability/Clinical Decision Making Stable/Uncomplicated    Clinical Decision Making Low    Rehab Potential Fair    PT Frequency 2x / week    PT Duration 8 weeks    PT Treatment/Interventions Therapeutic activities;Therapeutic exercise;Gait training;Balance training;Neuromuscular re-education;Patient/family education;Manual techniques;Dry needling;Electrical Stimulation;Iontophoresis '4mg'$ /ml Dexamethasone    PT Next Visit Plan Posture, thoracic extension, trunk and glute strength, lumbopelvic control during gait, manual techniques, modalities PRN    PT Home Exercise Plan Medbridge Access Code: [email or web address]    Consulted  and Agree with Plan of Care Patient                                         Joneen Boers PT, DPT  08/04/2022, 12:36 PM

## 2022-08-06 ENCOUNTER — Ambulatory Visit: Payer: Medicare PPO

## 2022-08-06 DIAGNOSIS — R262 Difficulty in walking, not elsewhere classified: Secondary | ICD-10-CM

## 2022-08-06 DIAGNOSIS — M5459 Other low back pain: Secondary | ICD-10-CM

## 2022-08-06 DIAGNOSIS — Z9181 History of falling: Secondary | ICD-10-CM

## 2022-08-06 DIAGNOSIS — M5431 Sciatica, right side: Secondary | ICD-10-CM

## 2022-08-06 DIAGNOSIS — M5417 Radiculopathy, lumbosacral region: Secondary | ICD-10-CM

## 2022-08-06 DIAGNOSIS — M6281 Muscle weakness (generalized): Secondary | ICD-10-CM

## 2022-08-06 NOTE — Therapy (Signed)
OUTPATIENT PHYSICAL THERAPY TREATMENT NOTE    Patient Name: Joyce Evans MRN: [id number] DOB:Jan 03, 1941, 82 y.o., female Today's Date: 08/06/2022  PCP: Derinda Late, MD  REFERRING PROVIDER: Leonie Green, NP   PT End of Session - 08/06/22 1508     Visit Number 43    Number of Visits 65    Date for PT Re-Evaluation 08/27/22    Authorization Type [employer]    PT Start Time 1508    PT Stop Time 1549    PT Time Calculation (min) 41 min    Equipment Utilized During Treatment Oxygen    Activity Tolerance Patient tolerated treatment well;No increased pain    Behavior During Therapy WFL for tasks assessed/performed                                                 Past Medical History:  Diagnosis Date   Actinic keratosis    Arthritis    osteo - shoulders, fingers   Atrial fibrillation (HCC)    Dizziness    thinks because of diuretic   Family history of adverse reaction to anesthesia    daughter -PONV   Gallstones 2016, 2017   GERD (gastroesophageal reflux disease)    Glaucoma    Heart murmur    history of   Hypertension    Melanoma ([place]) 04/20/2016   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 Excised: 05/19/2016, margins free.   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   Tricuspid valve disorder    leak   Past Surgical History:  Procedure Laterality Date   ABDOMINAL HYSTERECTOMY  1987   partial   CARDIAC CATHETERIZATION  2010   [facility]   CATARACT EXTRACTION W/PHACO Right 08/28/2015   Procedure: CATARACT EXTRACTION PHACO AND INTRAOCULAR LENS PLACEMENT ([place]);  Surgeon: Leandrew Koyanagi, MD;  Location: [place];  Service: Ophthalmology;  Laterality: Right;  TORIC   INTRAMEDULLARY (IM) NAIL INTERTROCHANTERIC Right 10/16/2021   Procedure: INTRAMEDULLARY (IM) NAIL  INTERTROCHANTRIC;  Surgeon: Renee Harder, MD;  Location: [facility];  Service: Orthopedics;  Laterality: Right;   MELANOMA EXCISION Left 05/19/2016   MIS. Left mid. lat. tricep. 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 S/P IM nail placement for right intertrochanteric hip fracture with Dr. Sharlet Salina on 10/16/2021. Pt slipped on the kithen floor. Pt was getting something from the microwave and pt tripped on her O2 tube. Pt was independent with ambulation prior to her fall. Pt fell before in the woods and pt tripped over a root 3 years ago (no injuries per pt). Pt has been using supplemental O2 for the past 2  years in November. Pt has interstitial lung disease (idiopathic pulmonary fibrosis). Recent breathing test is stable. Back pain began when pt was in Rehab in [place]. Feels like it is sciatical. Pt radiates along the sciatic nerve and R L 5 dermatome. Pt is healing fine as far as her R hip surgery is concerned. Also has R medial knee pain due to arthritis. Had home health PT which involves seated knee extension, hip flexion, standing hip abduction, extension. Pt did some walking wiht a rw. Feels a lot of pain all over. Walking too far is limited due to her O2 situation.  PRECAUTIONS: SpO2 not under 90 %  SUBJECTIVE: Wants to try 3 L of O2 today instead of 4L. Back is good. The red band shoulder squeeze helps with back pain.      PAIN:  Are you having pain? See  subjective       TODAY'S TREATMENT:   08/06/2022   Therapeutic exercise    At start of session:   SpO2 3 L 99%, HR 51  Gait 150 ft with SPC L hand CGA to SBA  86%, HR 92 (improves to  97%, after rest)    Gait 125 ft without using AD (just holding the SPC) CGA                87%, HR 75 (improves to  98%, HR 73 after rest)   Stepping over 2 mini hurdles 4x with SPC CGA   89%, HR 105 (improves to  98%, HR 63 after rest)  Again: Stepping over 2 mini hurdles 4x with SPC CGA   90%, HR 95 (improves to  99%, HR 63 after rest)  Standing B scapular retraction red band 10x with 5 second holds   98%, HR 62   Standing B shoulder extension red band 10x2 to promote trunk strengthening.    Forward step up onto 4 inch step with B UE assist   L 10x2    89%, HR 103 after 2 sets,    Increased back up to 4 L   97%, HRr 87 after rest  again Forward step up onto 4 inch step with B UE assist   L 10x    91%, HR 99 afterwards   98%, HR 72 after rest   Seated hip ER   R 10x5 seconds    Seated hip adduction small green ball squeeze 10x5 seconds    97%, HR 78 afterwards   Therapeutic rest breaks utilized for oxygen and muscle recovery.      Improved exercise technique, movement at target joints, use of target muscles after mod verbal, visual, tactile cues.      Response to treatment Pt tolerated session well without aggravation of symptoms      Clinical impression  Continued working on gait and balance to decrease fall risk. Utilized 3 L O2 initially per pt request but returned to 4L afterwards to maintain appropriate O2 levels after exertion. Pt able to ambulate up to 150 ft with [facility] today as well as able to negotiate 2 mini hurdles multiple times with [facility] assist. Pt tolerated session well without aggravation of low back pain. Pt will benefit from continued skilled physical therapy services to improve ability to ambulate and perform standing tasks with less  difficulty as well as to continue to decrease low back pain.      PATIENT EDUCATION: Education details: ther-ex, HEP Person educated: Patient Education method: Explanation, Demonstration, Tactile cues, Verbal cues, and Handouts  Education comprehension: verbalized understanding and returned demonstration   HOME EXERCISE PROGRAM: Access Code: [email or web address] URL: [email or web address] Date: 01/20/2022 Prepared by: Joneen Boers  Exercises - Seated Transversus Abdominis Bracing  - 1 x daily - 7 x weekly - 3 sets - 10 reps - 5 seconds hold - Seated Gluteal Sets  - 1 x daily - 7 x weekly - 3 sets - 10 reps - 5 seconds hold - Continued again 06/29/2022: Seated Scapular Retraction  - 1 x daily - 7 x weekly - 3 sets - 10 reps - 5 seconds  hold  Can perform aforementioned exercises in standing now.    - Seated Hip Adduction Isometrics with Ball  - 1 x daily - 7 x weekly - 2 sets - 10 reps - 5 seconds hold - Standing Hip Abduction with Counter Support  - 1 x daily - 7 x weekly - 3 sets - 10 reps - Discontinued on 03/09/2022: Seated Hip Abduction with Resistance  - 1 x daily - 7 x weekly - 2 sets - 10 reps  - Supine Posterior Pelvic Tilt  - 3 x daily - 7 x weekly - 3 sets - 10 reps - 5 seconds hold - Hooklying Clamshell with Resistance  - 1 x daily - 7 x weekly - 3 sets - 10 reps - Standing Posterior Pelvic Tilt  - 3 x daily - 7 x weekly - 3 sets - 10 reps - 5 seconds hold - Seated Passive Cervical Retraction  - 1 x daily - 7 x weekly - 3 sets - 10 reps - 5 seconds hold      PT Short Term Goals - 02/16/22 0941       PT SHORT TERM GOAL #1   Title Pt will be independent with her initial HEP to decrease pain, improve strength, function, and ability to stand and ambulate more comfortably.    Baseline No questions with HEP, has been doing them (02/16/2022)    Time 3    Period Weeks    Status Achieved    Target Date 02/05/22              PT Long Term Goals - 07/02/22 1508        PT LONG TERM GOAL #1   Title Pt will have a decrease in low back pain to 3/10 or less at worst to promote ability to stand and ambulate with less difficulty.    Baseline 6/10 low back pain at worst for the past 2 months (01/12/2022); 7/10 at most for the past 7 days (standing in the kitchen, more standing than normal becuase pt had guests over from [facility]) ( 02/16/2022);  6-7/10 back pain at worst for the past 7 days. Back pain at worst goes away faster since starting PT. (03/09/2022). R low back pain has not been bothering her. Thoracic back pain is about a 6/10 at worst for the past 7 days (04/02/2022); 4/10 low back pain at worst for the past 7 days (05/06/2022); 4-5/10 at worst for the past 7 days, comes and goes (05/20/2022); 4/10 R low back at most for the past 7 days, 5/10 thoracic back pain at most for the past 7 days (07/02/2022)    Time 8    Period Weeks    Status Partially Met    Target Date 08/27/22      PT LONG TERM GOAL #2   Title Pt will have a decrease in R LE pain to 3/10 or less  at worst to promote ability to stand and ambulate will less difficulty.    Baseline 9/10 R LE pain at worst (01/12/2022); 3/10 R sciatic and L5 dermatome pain at most for the past 7 days; has R > L heel pain however which wakes her up at night. (02/16/2022); 4/10 R LE pain at most for the past 7 days. R heel pain however wakes her up at night. (03/12/2022), 3/10 R LE pain at worst for the past 7 days (04/02/2022); R heel bothered her last night (possibly plantar fasciitis), 3/10 R LE pain at most for the past 7 days (05/06/2022)    Time 8    Period Weeks    Status Achieved    Target Date 05/07/22      PT LONG TERM GOAL #3   Title Pt will improve her FOTO score by at least 10 points as a demontsration of improved function.    Baseline Lumbar Spine FOTO 39 (01/12/2022); 46 (02/16/2022); 52 (03/12/2022); 49 (06/22/2022)    Time 8    Period Weeks    Status Achieved    Target Date 05/07/22      PT LONG TERM  GOAL #4   Title Pt will be able to ambulate at least 100 ft independently to promote mobility and return to prior level of function.    Baseline Able to ambulate with rw short distance, about 40 ft with reproduction of pain. (01/12/2022); able to ambulate 120 ft with rw, SBA (02/16/2022); SPC 40 ft, CGA (03/12/2022); Pt able to ambulate 88 ft with SPC on L side (03/31/2022); 210 ft with SPC on L side, rw 200 ft, no pain (05/06/2022); SPC on L side 200 ft, then no AD but CGA 100 ft x 2 with rest break in between bouts, no back pain reported (05/20/2022); No AD, 100 ft, SBA (07/02/2022)    Time 8    Period Weeks    Status Partially Met    Target Date 08/27/22              Plan - 08/06/22 1507     Clinical Impression Statement Continued working on gait and balance to decrease fall risk. Utilized 3 L O2 initially per pt request but returned to 4L afterwards to maintain appropriate O2 levels after exertion. Pt able to ambulate up to 150 ft with [facility] today as well as able to negotiate 2 mini hurdles multiple times with [facility] assist. Pt tolerated session well without aggravation of low back pain. Pt will benefit from continued skilled physical therapy services to improve ability to ambulate and perform standing tasks with less difficulty as well as to continue to decrease low back pain.    Personal Factors and Comorbidities Comorbidity 3+;Age;Time since onset of injury/illness/exacerbation;Fitness    Comorbidities Arthritis, atrial fibrillation, HTN, idiopathic pulmonary fibrosis    Examination-Activity Limitations Stairs;Bed Mobility;Stand;Sit;Lift;Transfers;Locomotion Level;Carry    Stability/Clinical Decision Making Stable/Uncomplicated    Rehab Potential Fair    PT Frequency 2x / week    PT Duration 8 weeks    PT Treatment/Interventions Therapeutic activities;Therapeutic exercise;Gait training;Balance training;Neuromuscular re-education;Patient/family education;Manual techniques;Dry needling;Electrical  Stimulation;Iontophoresis '4mg'$ /ml Dexamethasone    PT Next Visit Plan Posture, thoracic extension, trunk and glute strength, lumbopelvic control during gait, manual techniques, modalities PRN    PT Home Exercise Plan Medbridge Access Code: [email or web address]    Consulted and Agree with Plan of Care Patient  [address] PT, DPT  08/06/2022, 4:10 PM

## 2022-08-10 ENCOUNTER — Ambulatory Visit: Payer: Medicare PPO

## 2022-08-10 DIAGNOSIS — Z9181 History of falling: Secondary | ICD-10-CM

## 2022-08-10 DIAGNOSIS — M6281 Muscle weakness (generalized): Secondary | ICD-10-CM

## 2022-08-10 DIAGNOSIS — R262 Difficulty in walking, not elsewhere classified: Secondary | ICD-10-CM

## 2022-08-10 DIAGNOSIS — M5459 Other low back pain: Secondary | ICD-10-CM | POA: Diagnosis not present

## 2022-08-10 NOTE — Therapy (Signed)
OUTPATIENT PHYSICAL THERAPY TREATMENT NOTE    Patient Name: Joyce Evans MRN: [id number] DOB:[birth year], 82 y.o., female Today's Date: 08/10/2022  PCP: Derinda Late, MD  REFERRING PROVIDER: Leonie Green, NP   PT End of Session - 08/10/22 1544     Visit Number 44    Number of Visits 29    Date for PT Re-Evaluation 08/27/22    Authorization Type [employer]    PT Start Time 1025    PT Stop Time 1626    PT Time Calculation (min) 42 min    Equipment Utilized During Treatment Oxygen    Activity Tolerance Patient tolerated treatment well;No increased pain    Behavior During Therapy WFL for tasks assessed/performed                                                  Past Medical History:  Diagnosis Date   Actinic keratosis    Arthritis    osteo - shoulders, fingers   Atrial fibrillation (HCC)    Dizziness    thinks because of diuretic   Family history of adverse reaction to anesthesia    daughter -PONV   Gallstones 2016, 2017   GERD (gastroesophageal reflux disease)    Glaucoma    Heart murmur    history of   Hypertension    Melanoma ([place]) 04/20/2016   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 Excised: 05/19/2016, margins free.   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   Tricuspid valve disorder    leak   Past Surgical History:  Procedure Laterality Date   ABDOMINAL HYSTERECTOMY  1987   partial   CARDIAC CATHETERIZATION  2010   [facility]   CATARACT EXTRACTION W/PHACO Right 08/28/2015   Procedure: CATARACT EXTRACTION PHACO AND INTRAOCULAR LENS PLACEMENT ([place]);  Surgeon: Leandrew Koyanagi, MD;  Location: [place];  Service: Ophthalmology;  Laterality: Right;  TORIC   INTRAMEDULLARY (IM) NAIL INTERTROCHANTERIC Right 10/16/2021   Procedure: INTRAMEDULLARY (IM) NAIL  INTERTROCHANTRIC;  Surgeon: Renee Harder, MD;  Location: [facility];  Service: Orthopedics;  Laterality: Right;   MELANOMA EXCISION Left 05/19/2016   MIS. Left mid. lat. tricep. 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 S/P IM nail placement for right intertrochanteric hip fracture with Dr. Sharlet Salina on 10/16/2021. Pt slipped on the kithen floor. Pt was getting something from the microwave and pt tripped on her O2 tube. Pt was independent with ambulation prior to her fall. Pt fell before in the woods and pt tripped over a root 3 years ago (no injuries per pt). Pt has been using supplemental O2 for the past 2 years in November. Pt has interstitial lung disease (idiopathic pulmonary fibrosis). Recent  breathing test is stable. Back pain began when pt was in Rehab in [place] [facility]. Feels like it is sciatical. Pt radiates along the sciatic nerve and R L 5 dermatome. Pt is healing fine as far as her R hip surgery is concerned. Also has R medial knee pain due to arthritis. Had home health PT which involves seated knee extension, hip flexion, standing hip abduction, extension. Pt did some walking wiht a rw. Feels a lot of pain all over. Walking too far is limited due to her O2 situation.  PRECAUTIONS: SpO2 not under 90 %  SUBJECTIVE: Next pulmonary appointment is on 09/10/2022. Might be going to pulmonary rehab then. Wants to continue PT until then. No back pain. It has done real well.      PAIN:  Are you having pain? See subjective       TODAY'S TREATMENT:    08/10/2022   Therapeutic exercise    At start of session:   SpO2 4 L 99%, HR 54  Gait 150 ft with SPC L hand SBA  91%, HR 82 (improves to  98%, HR 51 after rest)   Gait 150 ft without using AD (just holding the SPC) SBA                89%, HR 70 (improves to  98%, HR 61 after rest)   Gait with SPC on floor mat with 2 ankle weights under mat to provide uneven surface. 10x  90%, HR 70 (improves to 98%, HR 58 after rest)  Stepping over 2 mini hurdles 8x with SPC CGA   85%, HR 83 (improves to  98%, HR 69 after rest)   Reviewed POC: continue PT until Pulmonary appointment to maintain gains.    Standing B scapular retraction red band 10x3 with 5 second holds   98%, HR 62   Forward step up onto 4 inch step with B UE assist   L 10x  93%, HR 84, improves to 99%, HR 63 after rest   again Forward step up onto 4 inch step with B UE assist   L 10x    94%, HR 98, improves to 99%, HR 63 after rest        Therapeutic rest breaks utilized for oxygen and muscle recovery.      Improved exercise technique, movement at target joints, use of target muscles after mod verbal, visual, tactile cues.      Response to treatment Pt tolerated session well without aggravation of symptoms      Clinical impression  Utilized 4 L O2 today to maintain more appropriate O2 levels after standing exercises compared to 3L. Worked on gait on uneven surface again today with SPC to promote ability to negotiate uneven terrain with less difficulty and to help decrease fall risk. Continued working on LE strengthening and gait with SPC on even surface to promote mobility. Challenges to progress include age and pulmonary fibrosis, affecting exercise tolerance. Pt tolerated session well without aggravation of low back pain. Pt will benefit from continued skilled physical therapy services to improve ability to ambulate and perform standing tasks with less difficulty as well as to continue to decrease  low back pain.      PATIENT EDUCATION: Education details: ther-ex, HEP Person educated: Patient Education method: Explanation, Demonstration, Tactile cues, Verbal cues, and Handouts Education comprehension: verbalized understanding and returned demonstration   HOME EXERCISE PROGRAM: Access Code: [email or web address] URL: [email or web address] Date: 01/20/2022 Prepared by: Joneen Boers  Exercises - Seated Transversus  Abdominis Bracing  - 1 x daily - 7 x weekly - 3 sets - 10 reps - 5 seconds hold - Seated Gluteal Sets  - 1 x daily - 7 x weekly - 3 sets - 10 reps - 5 seconds hold - Continued again 06/29/2022: Seated Scapular Retraction  - 1 x daily - 7 x weekly - 3 sets - 10 reps - 5 seconds  hold  Can perform aforementioned exercises in standing now.    - Seated Hip Adduction Isometrics with Ball  - 1 x daily - 7 x weekly - 2 sets - 10 reps - 5 seconds hold - Standing Hip Abduction with Counter Support  - 1 x daily - 7 x weekly - 3 sets - 10 reps - Discontinued on 03/09/2022: Seated Hip Abduction with Resistance  - 1 x daily - 7 x weekly - 2 sets - 10 reps  - Supine Posterior Pelvic Tilt  - 3 x daily - 7 x weekly - 3 sets - 10 reps - 5 seconds hold - Hooklying Clamshell with Resistance  - 1 x daily - 7 x weekly - 3 sets - 10 reps - Standing Posterior Pelvic Tilt  - 3 x daily - 7 x weekly - 3 sets - 10 reps - 5 seconds hold - Seated Passive Cervical Retraction  - 1 x daily - 7 x weekly - 3 sets - 10 reps - 5 seconds hold      PT Short Term Goals - 02/16/22 0941       PT SHORT TERM GOAL #1   Title Pt will be independent with her initial HEP to decrease pain, improve strength, function, and ability to stand and ambulate more comfortably.    Baseline No questions with HEP, has been doing them (02/16/2022)    Time 3    Period Weeks    Status Achieved    Target Date 02/05/22              PT Long Term Goals - 07/02/22 1508       PT LONG TERM GOAL #1   Title Pt will  have a decrease in low back pain to 3/10 or less at worst to promote ability to stand and ambulate with less difficulty.    Baseline 6/10 low back pain at worst for the past 2 months (01/12/2022); 7/10 at most for the past 7 days (standing in the kitchen, more standing than normal becuase pt had guests over from [facility]) ( 02/16/2022);  6-7/10 back pain at worst for the past 7 days. Back pain at worst goes away faster since starting PT. (03/09/2022). R low back pain has not been bothering her. Thoracic back pain is about a 6/10 at worst for the past 7 days (04/02/2022); 4/10 low back pain at worst for the past 7 days (05/06/2022); 4-5/10 at worst for the past 7 days, comes and goes (05/20/2022); 4/10 R low back at most for the past 7 days, 5/10 thoracic back pain at most for the past 7 days (07/02/2022)    Time 8    Period Weeks    Status Partially Met    Target Date 08/27/22      PT LONG TERM GOAL #2   Title Pt will have a decrease in R LE pain to 3/10 or less at worst to promote ability to stand and ambulate will less difficulty.    Baseline 9/10 R LE pain at worst (01/12/2022); 3/10 R sciatic and L5  dermatome pain at most for the past 7 days; has R > L heel pain however which wakes her up at night. (02/16/2022); 4/10 R LE pain at most for the past 7 days. R heel pain however wakes her up at night. (03/12/2022), 3/10 R LE pain at worst for the past 7 days (04/02/2022); R heel bothered her last night (possibly plantar fasciitis), 3/10 R LE pain at most for the past 7 days (05/06/2022)    Time 8    Period Weeks    Status Achieved    Target Date 05/07/22      PT LONG TERM GOAL #3   Title Pt will improve her FOTO score by at least 10 points as a demontsration of improved function.    Baseline Lumbar Spine FOTO 39 (01/12/2022); 46 (02/16/2022); 52 (03/12/2022); 49 (06/22/2022)    Time 8    Period Weeks    Status Achieved    Target Date 05/07/22      PT LONG TERM GOAL #4   Title Pt will be able to ambulate  at least 100 ft independently to promote mobility and return to prior level of function.    Baseline Able to ambulate with rw short distance, about 40 ft with reproduction of pain. (01/12/2022); able to ambulate 120 ft with rw, SBA (02/16/2022); SPC 40 ft, CGA (03/12/2022); Pt able to ambulate 88 ft with SPC on L side (03/31/2022); 210 ft with SPC on L side, rw 200 ft, no pain (05/06/2022); SPC on L side 200 ft, then no AD but CGA 100 ft x 2 with rest break in between bouts, no back pain reported (05/20/2022); No AD, 100 ft, SBA (07/02/2022)    Time 8    Period Weeks    Status Partially Met    Target Date 08/27/22              Plan - 08/10/22 1542     Clinical Impression Statement Utilized 4 L O2 today to maintain more appropriate O2 levels after standing exercises compared to 3L. Worked on gait on uneven surface again today with SPC to promote ability to negotiate uneven terrain with less difficulty and to help decrease fall risk. Continued working on LE strengthening and gait with SPC on even surface to promote mobility. Challenges to progress include age and pulmonary fibrosis, affecting exercise tolerance. Pt tolerated session well without aggravation of low back pain. Pt will benefit from continued skilled physical therapy services to improve ability to ambulate and perform standing tasks with less difficulty as well as to continue to decrease low back pain.    Personal Factors and Comorbidities Comorbidity 3+;Age;Time since onset of injury/illness/exacerbation;Fitness    Comorbidities Arthritis, atrial fibrillation, HTN, idiopathic pulmonary fibrosis    Examination-Activity Limitations Stairs;Bed Mobility;Stand;Sit;Lift;Transfers;Locomotion Level;Carry    Stability/Clinical Decision Making Stable/Uncomplicated    Rehab Potential Fair    PT Frequency 2x / week    PT Duration 8 weeks    PT Treatment/Interventions Therapeutic activities;Therapeutic exercise;Gait training;Balance  training;Neuromuscular re-education;Patient/family education;Manual techniques;Dry needling;Electrical Stimulation;Iontophoresis '4mg'$ /ml Dexamethasone    PT Next Visit Plan Posture, thoracic extension, trunk and glute strength, lumbopelvic control during gait, manual techniques, modalities PRN    PT Home Exercise Plan Medbridge Access Code: [email or web address]    Consulted and Agree with Plan of Care Patient  [address] PT, DPT  08/10/2022, 6:19 PM

## 2022-08-11 ENCOUNTER — Ambulatory Visit
Admission: RE | Admit: 2022-08-11 | Discharge: 2022-08-11 | Disposition: A | Discharge status: Home / Self Care | Payer: Medicare PPO | Source: Ambulatory Visit | Attending: Family Medicine | Admitting: Family Medicine

## 2022-08-11 DIAGNOSIS — Z1231 Encounter for screening mammogram for malignant neoplasm of breast: Secondary | ICD-10-CM | POA: Insufficient documentation

## 2022-08-13 ENCOUNTER — Ambulatory Visit: Payer: Medicare PPO | Attending: Orthopedic Surgery

## 2022-08-13 DIAGNOSIS — M6281 Muscle weakness (generalized): Secondary | ICD-10-CM | POA: Diagnosis present

## 2022-08-13 DIAGNOSIS — M5431 Sciatica, right side: Secondary | ICD-10-CM | POA: Diagnosis present

## 2022-08-13 DIAGNOSIS — M5459 Other low back pain: Secondary | ICD-10-CM | POA: Insufficient documentation

## 2022-08-13 DIAGNOSIS — Z9181 History of falling: Secondary | ICD-10-CM

## 2022-08-13 DIAGNOSIS — M5417 Radiculopathy, lumbosacral region: Secondary | ICD-10-CM | POA: Diagnosis present

## 2022-08-13 DIAGNOSIS — R262 Difficulty in walking, not elsewhere classified: Secondary | ICD-10-CM

## 2022-08-13 NOTE — Therapy (Signed)
OUTPATIENT PHYSICAL THERAPY TREATMENT NOTE    Patient Name: Joyce Evans MRN: [id number] DOB:[birth year], 82 y.o., female Today's Date: 08/13/2022  PCP: Derinda Late, MD  REFERRING PROVIDER: Leonie Green, NP   PT End of Session - 08/13/22 1544     Visit Number 45    Number of Visits 71    Date for PT Re-Evaluation 08/27/22    Authorization Type [employer]    PT Start Time 3716    PT Stop Time 1628    PT Time Calculation (min) 44 min    Equipment Utilized During Treatment Oxygen    Activity Tolerance Patient tolerated treatment well;No increased pain    Behavior During Therapy WFL for tasks assessed/performed                                                   Past Medical History:  Diagnosis Date   Actinic keratosis    Arthritis    osteo - shoulders, fingers   Atrial fibrillation (HCC)    Dizziness    thinks because of diuretic   Family history of adverse reaction to anesthesia    daughter -PONV   Gallstones 2016, 2017   GERD (gastroesophageal reflux disease)    Glaucoma    Heart murmur    history of   Hypertension    Melanoma ([place]) 04/20/2016   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 Excised: 05/19/2016, margins free.   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   Tricuspid valve disorder    leak   Past Surgical History:  Procedure Laterality Date   ABDOMINAL HYSTERECTOMY  1987   partial   CARDIAC CATHETERIZATION  2010   [facility]   CATARACT EXTRACTION W/PHACO Right 08/28/2015   Procedure: CATARACT EXTRACTION PHACO AND INTRAOCULAR LENS PLACEMENT ([place]);  Surgeon: Leandrew Koyanagi, MD;  Location: [facility];  Service: Ophthalmology;  Laterality: Right;  TORIC   INTRAMEDULLARY (IM) NAIL INTERTROCHANTERIC Right 10/16/2021   Procedure: INTRAMEDULLARY (IM) NAIL  INTERTROCHANTRIC;  Surgeon: Renee Harder, MD;  Location: [facility];  Service: Orthopedics;  Laterality: Right;   MELANOMA EXCISION Left 05/19/2016   MIS. Left mid. lat. tricep. 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 S/P IM nail placement for right intertrochanteric hip fracture with Dr. Sharlet Salina on 10/16/2021. Pt slipped on the kithen floor. Pt was getting something from the microwave and pt tripped on her O2 tube. Pt was independent with ambulation prior to her fall. Pt fell before in the woods and pt tripped over a root 3 years ago (no injuries per pt). Pt has been using supplemental O2 for the past 2 years in November. Pt has interstitial lung disease (idiopathic pulmonary fibrosis).  Recent breathing test is stable. Back pain began when pt was in Rehab in [place] place. Feels like it is sciatical. Pt radiates along the sciatic nerve and R L 5 dermatome. Pt is healing fine as far as her R hip surgery is concerned. Also has R medial knee pain due to arthritis. Had home health PT which involves seated knee extension, hip flexion, standing hip abduction, extension. Pt did some walking wiht a rw. Feels a lot of pain all over. Walking too far is limited due to her O2 situation.  PRECAUTIONS: SpO2 not under 90 %  SUBJECTIVE: Felt a little short of breath this morning.      PAIN:  Are you having pain? See subjective       TODAY'S TREATMENT:   08/13/2022   Gait training  At start of session:   SpO2 4 L 98%, HR 58  Gait 175 ft with SPC L hand SBA to  CGA  86%, HR 71 (improves to  100%, HR 58 after rest)   Gait 120 ft without using AD (just holding the SPC) SBA                91%, HR 58 (improves to  100%, HR 62 after rest)    Gait with SPC on floor mat with 2 ankle weights under mat to provide uneven surface. 10x  85%, HR 66 (improves to 100%, HR 53 after rest)   Therapeutic rest breaks utilized for oxygen and muscle recovery.   Improved technique after min verbal, visual, tactile cues.     Therapeutic exercise   Forward step up onto 4 inch step (to simulate curb) with SPC on R side  Step up L LE secondary to R knee pain 10x   93%, HR 105 afterwards, improves to 98%, HR 67 after rest    Forward step up onto and over 4 inch step to simulate curb negotiation 4x   93%, HR 66 improves to 98%, HR 69 after rest   Then another 4x    98%, HR 77   Then another 4x    91%, HR 92, improves to 98%, HR 60 after rest     Therapeutic rest breaks utilized for oxygen and muscle recovery.      Improved exercise technique, movement at target joints, use of target muscles after min to mod verbal, visual, tactile cues.      Response to treatment Pt tolerated session well without aggravation of symptoms      Clinical impression  Worked on gait on even and uneven surface with SPC to promote ability to negotiate even and uneven terrain with less difficulty and to help decrease fall risk using least restrictive AD. Continued working on LE strengthening to promote mobility. Challenges to progress include age and pulmonary fibrosis, affecting exercise tolerance. Pt tolerated session well without aggravation of low back pain. Pt will benefit from continued skilled physical therapy services to improve ability to ambulate and perform standing tasks with less difficulty as well as to continue to decrease low back pain.      PATIENT EDUCATION: Education details: ther-ex, HEP Person educated: Patient Education method: Explanation,  Demonstration, Tactile cues, Verbal cues, and Handouts Education comprehension: verbalized understanding and returned demonstration   HOME EXERCISE PROGRAM: Access Code: [email or web address] URL: [email or web address] Date: 01/20/2022 Prepared by: Joneen Boers  Exercises - Seated Transversus Abdominis Bracing  - 1 x daily - 7 x weekly - 3 sets - 10 reps - 5 seconds hold - Seated  Gluteal Sets  - 1 x daily - 7 x weekly - 3 sets - 10 reps - 5 seconds hold - Continued again 06/29/2022: Seated Scapular Retraction  - 1 x daily - 7 x weekly - 3 sets - 10 reps - 5 seconds  hold  Can perform aforementioned exercises in standing now.    - Seated Hip Adduction Isometrics with Ball  - 1 x daily - 7 x weekly - 2 sets - 10 reps - 5 seconds hold - Standing Hip Abduction with Counter Support  - 1 x daily - 7 x weekly - 3 sets - 10 reps - Discontinued on 03/09/2022: Seated Hip Abduction with Resistance  - 1 x daily - 7 x weekly - 2 sets - 10 reps  - Supine Posterior Pelvic Tilt  - 3 x daily - 7 x weekly - 3 sets - 10 reps - 5 seconds hold - Hooklying Clamshell with Resistance  - 1 x daily - 7 x weekly - 3 sets - 10 reps - Standing Posterior Pelvic Tilt  - 3 x daily - 7 x weekly - 3 sets - 10 reps - 5 seconds hold - Seated Passive Cervical Retraction  - 1 x daily - 7 x weekly - 3 sets - 10 reps - 5 seconds hold      PT Short Term Goals - 02/16/22 0941       PT SHORT TERM GOAL #1   Title Pt will be independent with her initial HEP to decrease pain, improve strength, function, and ability to stand and ambulate more comfortably.    Baseline No questions with HEP, has been doing them (02/16/2022)    Time 3    Period Weeks    Status Achieved    Target Date 02/05/22              PT Long Term Goals - 07/02/22 1508       PT LONG TERM GOAL #1   Title Pt will have a decrease in low back pain to 3/10 or less at worst to promote ability to stand and ambulate with less difficulty.    Baseline  6/10 low back pain at worst for the past 2 months (01/12/2022); 7/10 at most for the past 7 days (standing in the kitchen, more standing than normal becuase pt had guests over from [facility]) ( 02/16/2022);  6-7/10 back pain at worst for the past 7 days. Back pain at worst goes away faster since starting PT. (03/09/2022). R low back pain has not been bothering her. Thoracic back pain is about a 6/10 at worst for the past 7 days (04/02/2022); 4/10 low back pain at worst for the past 7 days (05/06/2022); 4-5/10 at worst for the past 7 days, comes and goes (05/20/2022); 4/10 R low back at most for the past 7 days, 5/10 thoracic back pain at most for the past 7 days (07/02/2022)    Time 8    Period Weeks    Status Partially Met    Target Date 08/27/22      PT LONG TERM GOAL #2   Title Pt will have a decrease in R LE pain to 3/10 or less at worst to promote ability to stand and ambulate will less difficulty.    Baseline 9/10 R LE pain at worst (01/12/2022); 3/10 R sciatic and L5 dermatome pain at most for the past 7 days; has R > L heel pain however which wakes her up at night. (02/16/2022);  4/10 R LE pain at most for the past 7 days. R heel pain however wakes her up at night. (03/12/2022), 3/10 R LE pain at worst for the past 7 days (04/02/2022); R heel bothered her last night (possibly plantar fasciitis), 3/10 R LE pain at most for the past 7 days (05/06/2022)    Time 8    Period Weeks    Status Achieved    Target Date 05/07/22      PT LONG TERM GOAL #3   Title Pt will improve her FOTO score by at least 10 points as a demontsration of improved function.    Baseline Lumbar Spine FOTO 39 (01/12/2022); 46 (02/16/2022); 52 (03/12/2022); 49 (06/22/2022)    Time 8    Period Weeks    Status Achieved    Target Date 05/07/22      PT LONG TERM GOAL #4   Title Pt will be able to ambulate at least 100 ft independently to promote mobility and return to prior level of function.    Baseline Able to ambulate with rw short  distance, about 40 ft with reproduction of pain. (01/12/2022); able to ambulate 120 ft with rw, SBA (02/16/2022); SPC 40 ft, CGA (03/12/2022); Pt able to ambulate 88 ft with SPC on L side (03/31/2022); 210 ft with SPC on L side, rw 200 ft, no pain (05/06/2022); SPC on L side 200 ft, then no AD but CGA 100 ft x 2 with rest break in between bouts, no back pain reported (05/20/2022); No AD, 100 ft, SBA (07/02/2022)    Time 8    Period Weeks    Status Partially Met    Target Date 08/27/22              Plan - 08/13/22 1540     Clinical Impression Statement Worked on gait on even and uneven surface with SPC to promote ability to negotiate even and uneven terrain with less difficulty and to help decrease fall risk using least restrictive AD. Continued working on LE strengthening to promote mobility. Challenges to progress include age and pulmonary fibrosis, affecting exercise tolerance. Pt tolerated session well without aggravation of low back pain. Pt will benefit from continued skilled physical therapy services to improve ability to ambulate and perform standing tasks with less difficulty as well as to continue to decrease low back pain.    Personal Factors and Comorbidities Comorbidity 3+;Age;Time since onset of injury/illness/exacerbation;Fitness    Comorbidities Arthritis, atrial fibrillation, HTN, idiopathic pulmonary fibrosis    Examination-Activity Limitations Stairs;Bed Mobility;Stand;Sit;Lift;Transfers;Locomotion Level;Carry    Stability/Clinical Decision Making Stable/Uncomplicated    Rehab Potential Fair    PT Frequency 2x / week    PT Duration 8 weeks    PT Treatment/Interventions Therapeutic activities;Therapeutic exercise;Gait training;Balance training;Neuromuscular re-education;Patient/family education;Manual techniques;Dry needling;Electrical Stimulation;Iontophoresis '4mg'$ /ml Dexamethasone    PT Next Visit Plan Posture, thoracic extension, trunk and glute strength, lumbopelvic control during  gait, manual techniques, modalities PRN    PT Home Exercise Plan Medbridge Access Code: [email or web address]    Consulted and Agree with Plan of Care Patient                                          Joneen Boers PT, DPT  08/13/2022, 4:54 PM

## 2022-08-17 ENCOUNTER — Ambulatory Visit: Payer: Medicare PPO

## 2022-08-17 DIAGNOSIS — M5459 Other low back pain: Secondary | ICD-10-CM

## 2022-08-17 DIAGNOSIS — M5417 Radiculopathy, lumbosacral region: Secondary | ICD-10-CM

## 2022-08-17 DIAGNOSIS — M5431 Sciatica, right side: Secondary | ICD-10-CM

## 2022-08-17 DIAGNOSIS — M6281 Muscle weakness (generalized): Secondary | ICD-10-CM

## 2022-08-17 DIAGNOSIS — Z9181 History of falling: Secondary | ICD-10-CM

## 2022-08-17 DIAGNOSIS — R262 Difficulty in walking, not elsewhere classified: Secondary | ICD-10-CM

## 2022-08-17 NOTE — Therapy (Signed)
OUTPATIENT PHYSICAL THERAPY TREATMENT NOTE    Patient Name: Joyce Evans MRN: [id number] DOB:May 30, 1941, 82 y.o., female Today's Date: 08/17/2022  PCP: Derinda Late, MD  REFERRING PROVIDER: Leonie Green, NP   PT End of Session - 08/17/22 1505     Visit Number 46    Number of Visits 26    Date for PT Re-Evaluation 08/27/22    Authorization Type [employer]    PT Start Time 1505    PT Stop Time 1546    PT Time Calculation (min) 41 min    Equipment Utilized During Treatment Oxygen    Activity Tolerance Patient tolerated treatment well;No increased pain    Behavior During Therapy WFL for tasks assessed/performed                                                    Past Medical History:  Diagnosis Date   Actinic keratosis    Arthritis    osteo - shoulders, fingers   Atrial fibrillation (HCC)    Dizziness    thinks because of diuretic   Family history of adverse reaction to anesthesia    daughter -PONV   Gallstones 2016, 2017   GERD (gastroesophageal reflux disease)    Glaucoma    Heart murmur    history of   Hypertension    Melanoma ([place]) 04/20/2016   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 Excised: 05/19/2016, margins free.   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   Tricuspid valve disorder    leak   Past Surgical History:  Procedure Laterality Date   ABDOMINAL HYSTERECTOMY  1987   partial   CARDIAC CATHETERIZATION  2010   [facility]   CATARACT EXTRACTION W/PHACO Right 08/28/2015   Procedure: CATARACT EXTRACTION PHACO AND INTRAOCULAR LENS PLACEMENT ([place]);  Surgeon: Leandrew Koyanagi, MD;  Location: [facility];  Service: Ophthalmology;  Laterality: Right;  TORIC   INTRAMEDULLARY (IM) NAIL INTERTROCHANTERIC Right 10/16/2021   Procedure: INTRAMEDULLARY (IM) NAIL  INTERTROCHANTRIC;  Surgeon: Renee Harder, MD;  Location: [facility];  Service: Orthopedics;  Laterality: Right;   MELANOMA EXCISION Left 05/19/2016   MIS. Left mid. lat. tricep. 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 S/P IM nail placement for right intertrochanteric hip fracture with Dr. Sharlet Salina on 10/16/2021. Pt slipped on the kithen floor. Pt was getting something from the microwave and pt tripped on her O2 tube. Pt was independent with ambulation prior to her fall. Pt fell before in the woods and pt tripped over a root 3 years ago (no injuries per pt). Pt has been using supplemental O2 for  the past 2 years in November. Pt has interstitial lung disease (idiopathic pulmonary fibrosis). Recent breathing test is stable. Back pain began when pt was in Rehab in [place] place. Feels like it is sciatical. Pt radiates along the sciatic nerve and R L 5 dermatome. Pt is healing fine as far as her R hip surgery is concerned. Also has R medial knee pain due to arthritis. Had home health PT which involves seated knee extension, hip flexion, standing hip abduction, extension. Pt did some walking wiht a rw. Feels a lot of pain all over. Walking too far is limited due to her O2 situation.  PRECAUTIONS: SpO2 not under 90 %  SUBJECTIVE: Feels like PT is more beneficial than pulmonary rehab functionally. No pain currently in back, R knee has been bothering her (medial knee)     PAIN:  Are you  having pain? See subjective       TODAY'S TREATMENT:   08/17/2022   Gait training   At start of session:   SpO2 4 L 100%, HR 67  Gait 200 ft with SPC L hand SBA to CGA  86%, HR 79 (improves to  98 %, HR 66 after rest)   Gait 105 ft without using AD (just holding the SPC) SBA to CGA                 90%, HR 77 (improves to  98%, HR 69 after rest)   Gait with SPC on floor mat with 2 ankle weights under mat to provide uneven surface. 8x  88%, HR 93 (improves to 98%, HR 64 after rest)    Therapeutic rest breaks utilized for oxygen and muscle recovery.   Improved technique after min verbal, visual, tactile cues.     Therapeutic exercise   Forward step up onto 4 inch step (to simulate curb) with SPC on R side  Step up L LE secondary to R knee pain 10x   94%, HR 98 afterwards, improves to 98%, HR 67 after rest     Forward step up onto and over 4 inch step to simulate curb negotiation 10x   88%, HR 74 improves to 99%, HR 76 after rest   Then another 4x    94%, HR 95   (improves to 98%, HR 66 after rest)   Seated B scapular retraction 10x5 seconds to improve lung space   100 % SpO2 afterwards   Therapeutic rest breaks utilized for oxygen and muscle recovery.      Improved exercise technique, movement at target joints, use of target muscles after min to mod verbal, visual, tactile cues.      Response to treatment Pt tolerated session well without aggravation of symptoms      Clinical impression  Continued working on gait on even and uneven surface with SPC to promote ability to negotiate even and uneven terrain with less difficulty and to help decrease fall risk using least restrictive AD. Improving overall ability to perform safely observed. Continued working on LE strengthening to promote mobility. Challenges to progress include age and pulmonary fibrosis, affecting exercise tolerance. Pt tolerated session well without aggravation of low back pain. Pt  will benefit from continued skilled physical therapy services to improve ability to ambulate and perform standing tasks with less difficulty as well as to continue to decrease low back pain.      PATIENT EDUCATION: Education details: ther-ex, HEP Person educated: Patient Education method: Explanation, Demonstration, Corporate treasurer cues, Verbal cues, and Handouts Education comprehension:  verbalized understanding and returned demonstration   HOME EXERCISE PROGRAM: Access Code: [email or web address] URL: [email or web address] Date: 01/20/2022 Prepared by: Joneen Boers  Exercises - Seated Transversus Abdominis Bracing  - 1 x daily - 7 x weekly - 3 sets - 10 reps - 5 seconds hold - Seated Gluteal Sets  - 1 x daily - 7 x weekly - 3 sets - 10 reps - 5 seconds hold - Continued again 06/29/2022: Seated Scapular Retraction  - 1 x daily - 7 x weekly - 3 sets - 10 reps - 5 seconds  hold  Can perform aforementioned exercises in standing now.    - Seated Hip Adduction Isometrics with Ball  - 1 x daily - 7 x weekly - 2 sets - 10 reps - 5 seconds hold - Standing Hip Abduction with Counter Support  - 1 x daily - 7 x weekly - 3 sets - 10 reps - Discontinued on 03/09/2022: Seated Hip Abduction with Resistance  - 1 x daily - 7 x weekly - 2 sets - 10 reps  - Supine Posterior Pelvic Tilt  - 3 x daily - 7 x weekly - 3 sets - 10 reps - 5 seconds hold - Hooklying Clamshell with Resistance  - 1 x daily - 7 x weekly - 3 sets - 10 reps - Standing Posterior Pelvic Tilt  - 3 x daily - 7 x weekly - 3 sets - 10 reps - 5 seconds hold - Seated Passive Cervical Retraction  - 1 x daily - 7 x weekly - 3 sets - 10 reps - 5 seconds hold      PT Short Term Goals - 02/16/22 0941       PT SHORT TERM GOAL #1   Title Pt will be independent with her initial HEP to decrease pain, improve strength, function, and ability to stand and ambulate more comfortably.    Baseline No questions with HEP, has been doing them (02/16/2022)     Time 3    Period Weeks    Status Achieved    Target Date 02/05/22              PT Long Term Goals - 07/02/22 1508       PT LONG TERM GOAL #1   Title Pt will have a decrease in low back pain to 3/10 or less at worst to promote ability to stand and ambulate with less difficulty.    Baseline 6/10 low back pain at worst for the past 2 months (01/12/2022); 7/10 at most for the past 7 days (standing in the kitchen, more standing than normal becuase pt had guests over from [facility]) ( 02/16/2022);  6-7/10 back pain at worst for the past 7 days. Back pain at worst goes away faster since starting PT. (03/09/2022). R low back pain has not been bothering her. Thoracic back pain is about a 6/10 at worst for the past 7 days (04/02/2022); 4/10 low back pain at worst for the past 7 days (05/06/2022); 4-5/10 at worst for the past 7 days, comes and goes (05/20/2022); 4/10 R low back at most for the past 7 days, 5/10 thoracic back pain at most for the past 7 days (07/02/2022)    Time 8    Period Weeks    Status Partially Met    Target Date 08/27/22      PT LONG TERM GOAL #2   Title Pt will have a decrease in R LE pain to 3/10 or less at worst  to promote ability to stand and ambulate will less difficulty.    Baseline 9/10 R LE pain at worst (01/12/2022); 3/10 R sciatic and L5 dermatome pain at most for the past 7 days; has R > L heel pain however which wakes her up at night. (02/16/2022); 4/10 R LE pain at most for the past 7 days. R heel pain however wakes her up at night. (03/12/2022), 3/10 R LE pain at worst for the past 7 days (04/02/2022); R heel bothered her last night (possibly plantar fasciitis), 3/10 R LE pain at most for the past 7 days (05/06/2022)    Time 8    Period Weeks    Status Achieved    Target Date 05/07/22      PT LONG TERM GOAL #3   Title Pt will improve her FOTO score by at least 10 points as a demontsration of improved function.    Baseline Lumbar Spine FOTO 39 (01/12/2022); 46  (02/16/2022); 52 (03/12/2022); 49 (06/22/2022)    Time 8    Period Weeks    Status Achieved    Target Date 05/07/22      PT LONG TERM GOAL #4   Title Pt will be able to ambulate at least 100 ft independently to promote mobility and return to prior level of function.    Baseline Able to ambulate with rw short distance, about 40 ft with reproduction of pain. (01/12/2022); able to ambulate 120 ft with rw, SBA (02/16/2022); SPC 40 ft, CGA (03/12/2022); Pt able to ambulate 88 ft with SPC on L side (03/31/2022); 210 ft with SPC on L side, rw 200 ft, no pain (05/06/2022); SPC on L side 200 ft, then no AD but CGA 100 ft x 2 with rest break in between bouts, no back pain reported (05/20/2022); No AD, 100 ft, SBA (07/02/2022)    Time 8    Period Weeks    Status Partially Met    Target Date 08/27/22              Plan - 08/17/22 1504     Clinical Impression Statement Continued working on gait on even and uneven surface with SPC to promote ability to negotiate even and uneven terrain with less difficulty and to help decrease fall risk using least restrictive AD. Improving overall ability to perform safely observed. Continued working on LE strengthening to promote mobility. Challenges to progress include age and pulmonary fibrosis, affecting exercise tolerance. Pt tolerated session well without aggravation of low back pain. Pt will benefit from continued skilled physical therapy services to improve ability to ambulate and perform standing tasks with less difficulty as well as to continue to decrease low back pain.    Personal Factors and Comorbidities Comorbidity 3+;Age;Time since onset of injury/illness/exacerbation;Fitness    Comorbidities Arthritis, atrial fibrillation, HTN, idiopathic pulmonary fibrosis    Examination-Activity Limitations Stairs;Bed Mobility;Stand;Sit;Lift;Transfers;Locomotion Level;Carry    Stability/Clinical Decision Making Stable/Uncomplicated    Rehab Potential Fair    PT Frequency 2x /  week    PT Duration 8 weeks    PT Treatment/Interventions Therapeutic activities;Therapeutic exercise;Gait training;Balance training;Neuromuscular re-education;Patient/family education;Manual techniques;Dry needling;Electrical Stimulation;Iontophoresis '4mg'$ /ml Dexamethasone    PT Next Visit Plan Posture, thoracic extension, trunk and glute strength, lumbopelvic control during gait, manual techniques, modalities PRN    PT Home Exercise Plan Medbridge Access Code: [email or web address]    Consulted and Agree with Plan of Care Patient  [address] PT, DPT  08/17/2022, 4:47 PM

## 2022-08-20 ENCOUNTER — Ambulatory Visit: Payer: Medicare PPO

## 2022-08-20 DIAGNOSIS — M6281 Muscle weakness (generalized): Secondary | ICD-10-CM

## 2022-08-20 DIAGNOSIS — M5459 Other low back pain: Secondary | ICD-10-CM

## 2022-08-20 DIAGNOSIS — R262 Difficulty in walking, not elsewhere classified: Secondary | ICD-10-CM | POA: Diagnosis not present

## 2022-08-20 DIAGNOSIS — Z9181 History of falling: Secondary | ICD-10-CM

## 2022-08-20 NOTE — Therapy (Signed)
OUTPATIENT PHYSICAL THERAPY TREATMENT NOTE    Patient Name: Joyce Evans MRN: [id number] DOB:1941/05/26, 82 y.o., female Today's Date: 08/21/2022  PCP: Derinda Late, MD  REFERRING PROVIDER: Leonie Green, NP   PT End of Session - 08/20/22 1637     Visit Number 72    Number of Visits 44    Date for PT Re-Evaluation 08/27/22    Authorization Type [employer]    PT Start Time [id number]    PT Stop Time [id number]    PT Time Calculation (min) 40 min    Equipment Utilized During Treatment Oxygen    Activity Tolerance Patient tolerated treatment well;No increased pain    Behavior During Therapy WFL for tasks assessed/performed                                                    Past Medical History:  Diagnosis Date   Actinic keratosis    Arthritis    osteo - shoulders, fingers   Atrial fibrillation (HCC)    Dizziness    thinks because of diuretic   Family history of adverse reaction to anesthesia    daughter -PONV   Gallstones 2016, 2017   GERD (gastroesophageal reflux disease)    Glaucoma    Heart murmur    history of   Hypertension    Melanoma ([place]) 04/20/2016   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 Excised: 05/19/2016, margins free.   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   Tricuspid valve disorder    leak   Past Surgical History:  Procedure Laterality Date   ABDOMINAL HYSTERECTOMY  1987   partial   CARDIAC CATHETERIZATION  2010   [facility]   CATARACT EXTRACTION W/PHACO Right 08/28/2015   Procedure: CATARACT EXTRACTION PHACO AND INTRAOCULAR LENS PLACEMENT ([place]);  Surgeon: Leandrew Koyanagi, MD;  Location: [facility];  Service: Ophthalmology;  Laterality: Right;  TORIC   INTRAMEDULLARY (IM) NAIL INTERTROCHANTERIC Right 10/16/2021   Procedure: INTRAMEDULLARY (IM) NAIL  INTERTROCHANTRIC;  Surgeon: Renee Harder, MD;  Location: [facility];  Service: Orthopedics;  Laterality: Right;   MELANOMA EXCISION Left 05/19/2016   MIS. Left mid. lat. tricep. 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 S/P IM nail placement for right intertrochanteric hip fracture with Dr. Sharlet Salina on 10/16/2021. Pt slipped on the kithen floor. Pt was getting something from the microwave and pt tripped on her O2 tube. Pt was independent with ambulation prior to her fall. Pt fell before in the woods and pt tripped over a root 3 years ago (no injuries per pt). Pt has been using supplemental O2 for the past 2 years in November. Pt has  interstitial lung disease (idiopathic pulmonary fibrosis). Recent breathing test is stable. Back pain began when pt was in Rehab in [place] [facility]. Feels like it is sciatical. Pt radiates along the sciatic nerve and R L 5 dermatome. Pt is healing fine as far as her R hip surgery is concerned. Also has R medial knee pain due to arthritis. Had home health PT which involves seated knee extension, hip flexion, standing hip abduction, extension. Pt did some walking wiht a rw. Feels a lot of pain all over. Walking too far is limited due to her O2 situation.  PRECAUTIONS: SpO2 not under 90 %  SUBJECTIVE: Pt reports consistent SoB since having covid. Still recovering well with her SPO2 with functional activity but plans to try and move up her pulmonologist appointment.     PAIN:  Are you having pain? See  subjective       TODAY'S TREATMENT:   08/20/2022   There.ex:  At start of session: SpO2 4 L 84% ambulating into clinic. 96-97% at rest.   Gait 1x200' with SPC L hand. Supervision. 82%, HR 101 (improves to  > 90%, after ~ 1 minute seated rest and PLB)   Gait 1 x 100' carrying SPC L hand supervision. 88-90%, HR: 89 BPM.   3x6, STS no UE support. Excellent knee and hip extension. Maintaining 88% to upper 90's after STS exercises checked after each set.   Neuro Re-Ed:   Obstacle course: 1/2 bolster --> yellow hurdle --> 1/2 bolster --> yellow hurdle --> x4 alternating cone taps --> airex pad step up and over. 2x2 reps, seated rest b/t bouts. SBA. Improved SLS on second bout.   Alternating step ups 4" step: x8/LE for SLS time, CGA.     Therapeutic rest breaks utilized for oxygen and muscle recovery throughout session.         Response to treatment Pt tolerated session well without aggravation of symptoms      Clinical impression Continuing PT POC focusing on imbalance and LE strength and gait. Pt overall demonstrates improved steadiness with consistent SPC use. Mild imbalance with stepping strategy with gait in setting without SPC. Pt is able to recover without external support. Pt continuing to need seated recovery due to her worsened SOB but recovers quickly on 4 L/min after gait and balance activities. Pt progressed in dynamic balance with various step over heights and SLS activities with good completion using LRAD without evidence of imbalance but does fatigue quickly. Pt will benefit from continued skilled physical therapy services to improve ability to ambulate and perform standing tasks with less difficulty as well as to continue to decrease low back pain.     PATIENT EDUCATION: Education details: ther-ex, HEP Person educated: Patient Education method: Explanation, Demonstration, Tactile cues, Verbal cues, and Handouts Education comprehension: verbalized  understanding and returned demonstration   HOME EXERCISE PROGRAM: Access Code: [email or web address] URL: [email or web address] Date: 01/20/2022 Prepared by: Joneen Boers  Exercises - Seated Transversus Abdominis Bracing  - 1 x daily - 7 x weekly - 3 sets - 10 reps - 5 seconds hold - Seated Gluteal Sets  - 1 x daily - 7 x weekly - 3 sets - 10 reps - 5 seconds hold - Continued again 06/29/2022: Seated Scapular Retraction  - 1 x daily - 7 x weekly - 3 sets - 10 reps - 5 seconds  hold  Can perform aforementioned exercises in standing now.    - Seated Hip Adduction Isometrics with Ball  - 1 x daily -  7 x weekly - 2 sets - 10 reps - 5 seconds hold - Standing Hip Abduction with Counter Support  - 1 x daily - 7 x weekly - 3 sets - 10 reps - Discontinued on 03/09/2022: Seated Hip Abduction with Resistance  - 1 x daily - 7 x weekly - 2 sets - 10 reps  - Supine Posterior Pelvic Tilt  - 3 x daily - 7 x weekly - 3 sets - 10 reps - 5 seconds hold - Hooklying Clamshell with Resistance  - 1 x daily - 7 x weekly - 3 sets - 10 reps - Standing Posterior Pelvic Tilt  - 3 x daily - 7 x weekly - 3 sets - 10 reps - 5 seconds hold - Seated Passive Cervical Retraction  - 1 x daily - 7 x weekly - 3 sets - 10 reps - 5 seconds hold      PT Short Term Goals - 02/16/22 0941       PT SHORT TERM GOAL #1   Title Pt will be independent with her initial HEP to decrease pain, improve strength, function, and ability to stand and ambulate more comfortably.    Baseline No questions with HEP, has been doing them (02/16/2022)    Time 3    Period Weeks    Status Achieved    Target Date 02/05/22              PT Long Term Goals - 07/02/22 1508       PT LONG TERM GOAL #1   Title Pt will have a decrease in low back pain to 3/10 or less at worst to promote ability to stand and ambulate with less difficulty.    Baseline 6/10 low back pain at worst for the past 2 months (01/12/2022); 7/10 at most for the past 7 days  (standing in the kitchen, more standing than normal becuase pt had guests over from [facility]) ( 02/16/2022);  6-7/10 back pain at worst for the past 7 days. Back pain at worst goes away faster since starting PT. (03/09/2022). R low back pain has not been bothering her. Thoracic back pain is about a 6/10 at worst for the past 7 days (04/02/2022); 4/10 low back pain at worst for the past 7 days (05/06/2022); 4-5/10 at worst for the past 7 days, comes and goes (05/20/2022); 4/10 R low back at most for the past 7 days, 5/10 thoracic back pain at most for the past 7 days (07/02/2022)    Time 8    Period Weeks    Status Partially Met    Target Date 08/27/22      PT LONG TERM GOAL #2   Title Pt will have a decrease in R LE pain to 3/10 or less at worst to promote ability to stand and ambulate will less difficulty.    Baseline 9/10 R LE pain at worst (01/12/2022); 3/10 R sciatic and L5 dermatome pain at most for the past 7 days; has R > L heel pain however which wakes her up at night. (02/16/2022); 4/10 R LE pain at most for the past 7 days. R heel pain however wakes her up at night. (03/12/2022), 3/10 R LE pain at worst for the past 7 days (04/02/2022); R heel bothered her last night (possibly plantar fasciitis), 3/10 R LE pain at most for the past 7 days (05/06/2022)    Time 8    Period Weeks    Status Achieved  Target Date 05/07/22      PT LONG TERM GOAL #3   Title Pt will improve her FOTO score by at least 10 points as a demontsration of improved function.    Baseline Lumbar Spine FOTO 39 (01/12/2022); 46 (02/16/2022); 52 (03/12/2022); 49 (06/22/2022)    Time 8    Period Weeks    Status Achieved    Target Date 05/07/22      PT LONG TERM GOAL #4   Title Pt will be able to ambulate at least 100 ft independently to promote mobility and return to prior level of function.    Baseline Able to ambulate with rw short distance, about 40 ft with reproduction of pain. (01/12/2022); able to ambulate 120 ft with rw, SBA  (02/16/2022); SPC 40 ft, CGA (03/12/2022); Pt able to ambulate 88 ft with SPC on L side (03/31/2022); 210 ft with SPC on L side, rw 200 ft, no pain (05/06/2022); SPC on L side 200 ft, then no AD but CGA 100 ft x 2 with rest break in between bouts, no back pain reported (05/20/2022); No AD, 100 ft, SBA (07/02/2022)    Time 8    Period Weeks    Status Partially Met    Target Date 08/27/22                       Salem Caster. Fairly IV, PT, DPT Physical Therapist- [facility]  08/21/2022, 8:12 AM

## 2022-08-24 ENCOUNTER — Ambulatory Visit: Payer: Medicare PPO

## 2022-08-27 ENCOUNTER — Ambulatory Visit: Payer: Medicare PPO

## 2022-08-27 ENCOUNTER — Encounter: Payer: Self-pay | Admitting: Physical Therapy

## 2022-08-27 DIAGNOSIS — R262 Difficulty in walking, not elsewhere classified: Secondary | ICD-10-CM | POA: Diagnosis not present

## 2022-08-27 DIAGNOSIS — M5459 Other low back pain: Secondary | ICD-10-CM

## 2022-08-27 DIAGNOSIS — Z9181 History of falling: Secondary | ICD-10-CM

## 2022-08-27 DIAGNOSIS — M6281 Muscle weakness (generalized): Secondary | ICD-10-CM

## 2022-08-27 NOTE — Therapy (Addendum)
OUTPATIENT PHYSICAL THERAPY RE-CERTIFICATION NOTE    Patient Name: Joyce Evans MRN: [id number] DOB:11-06-1940, 82 y.o., female Today's Date: 08/30/2022  PCP: Derinda Late, MD  REFERRING PROVIDER: Leonie Green, NP     08/27/22 1420  PT Visits / Re-Eval  Visit Number 48  Number of Visits 65  Date for PT Re-Evaluation 10/22/22  Authorization  Authorization Type [employer]  Authorization Time Period 06/16/22-10/09/22  Authorization - Visit Number 14  Authorization - Number of Visits 34  Progress Note Due on Visit 50  PT Time Calculation  PT Start Time [id number]  PT Stop Time 1455  PT Time Calculation (min) 38 min  PT - End of Session  Equipment Utilized During Treatment Oxygen  Activity Tolerance Patient tolerated treatment well;No increased pain  Behavior During Therapy WFL for tasks assessed/performed      Past Medical History:  Diagnosis Date   Actinic keratosis    Arthritis    osteo - shoulders, fingers   Atrial fibrillation (HCC)    Dizziness    thinks because of diuretic   Family history of adverse reaction to anesthesia    daughter -PONV   Gallstones 2016, 2017   GERD (gastroesophageal reflux disease)    Glaucoma    Heart murmur    history of   Hypertension    Melanoma ([place]) 04/20/2016   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 Excised: 05/19/2016, margins free.   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   Tricuspid valve disorder    leak   Past Surgical History:  Procedure Laterality Date   ABDOMINAL HYSTERECTOMY  1987   partial   CARDIAC CATHETERIZATION  2010   [facility]   CATARACT EXTRACTION W/PHACO Right 08/28/2015   Procedure: CATARACT EXTRACTION PHACO AND INTRAOCULAR LENS PLACEMENT ([place]);  Surgeon: Leandrew Koyanagi, MD;  Location: [facility];  Service: Ophthalmology;  Laterality:  Right;  TORIC   INTRAMEDULLARY (IM) NAIL INTERTROCHANTERIC Right 10/16/2021   Procedure: INTRAMEDULLARY (IM) NAIL INTERTROCHANTRIC;  Surgeon: Renee Harder, MD;  Location: [facility];  Service: Orthopedics;  Laterality: Right;   MELANOMA EXCISION Left 05/19/2016   MIS. Left mid. lat. tricep. 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 S/P IM nail placement for right intertrochanteric hip fracture with Dr. Sharlet Salina on 10/16/2021. Pt slipped on the kithen floor. Pt was getting something from the microwave and pt tripped on her O2 tube. Pt was independent with ambulation prior to her fall. Pt fell before in the woods and pt tripped over a root 3 years ago (no injuries per pt). Pt has been using supplemental O2 for the past 2 years in November. Pt has  interstitial lung disease (idiopathic pulmonary fibrosis). Recent breathing test is stable. Back pain began when pt was in Rehab in [place] [facility]. Feels like it is sciatical. Pt radiates along the sciatic nerve and R L 5 dermatome. Pt is healing fine as far as her R hip surgery is concerned. Also has R medial knee pain due to arthritis. Had home health PT which involves seated knee extension, hip flexion, standing hip abduction, extension. Pt did some walking wiht a rw. Feels a lot of pain all over. Walking too far is limited due to her O2  situation.  PRECAUTIONS: SpO2 not under 90 %  SUBJECTIVE: Pt reports still feeling more SOB with activity. Pulmonologist appointment later this month. Reports he goals with PT are to improve strength and balance and walk more safely without need for AD. Reports she feels like she is not where she hopes to be.     PAIN:  Are you having pain? See subjective       TODAY'S TREATMENT:   08/27/22:   Today's session focused on assessing prior goals and making new goals. Pt presents with muscular weakness, decreased endurance with gait, and decreased gait speed placing pt at risk for falls.   SPO2 monitored throughout testing. After 2 MWT, pt did desaturate on 4 L/min to 79-80%. After 2 minutes rest and PLB, pt returns to >95%. HR normal throughout all testing. Other desaturations on 4 L/min in upper 80's with quick return > 90% with seated rest and PLB.   Please see goals and clinical impression for details.    Response to treatment Pt tolerated session well without aggravation of symptoms      Clinical impression Pt at end of certification with PT. Pt remains with LBP that has not much improved with PT. However pt's main goals are functional. She still remains needing close supervision with gait but generally completes gait without physical assistance or AD today. Pt presents below norms for 8th decade of pt's life with 30 sec STS test indicating decreased LE strength and endurance needed for her age group with functional mobility like transfers and gait. Pt also scoring .75 m/s with self selected gait speed placing pt below norm for safe household distances (.8 m/s per research). Pt's goals are to be ambulating in household and community distances without AD thus this gait speed does place pt at risk for falls. Pt will continue to benefit from skilled PT services for additional 8 weeks to address pt's muscular weakness, gait abnormality, and imbalance to optimize pt's functional capacity  and reduce risk of falls.   PATIENT EDUCATION: Education details: ther-ex, HEP Person educated: Patient Education method: Explanation, Demonstration, Tactile cues, Verbal cues, and Handouts Education comprehension: verbalized understanding and returned demonstration   HOME EXERCISE PROGRAM: Access Code: [email or web address] URL: [email or web address] Date: 01/20/2022 Prepared by: Joneen Boers  Exercises - Seated Transversus Abdominis Bracing  - 1 x daily - 7 x weekly - 3 sets - 10 reps - 5 seconds hold - Seated Gluteal Sets  - 1 x daily - 7 x weekly - 3 sets - 10 reps - 5 seconds hold - Continued again 06/29/2022: Seated Scapular Retraction  - 1 x daily - 7 x weekly - 3 sets - 10 reps - 5 seconds  hold  Can perform aforementioned exercises in standing now.    - Seated Hip Adduction Isometrics with Ball  - 1 x daily - 7 x weekly - 2 sets - 10 reps - 5 seconds hold -  Standing Hip Abduction with Counter Support  - 1 x daily - 7 x weekly - 3 sets - 10 reps - Discontinued on 03/09/2022: Seated Hip Abduction with Resistance  - 1 x daily - 7 x weekly - 2 sets - 10 reps  - Supine Posterior Pelvic Tilt  - 3 x daily - 7 x weekly - 3 sets - 10 reps - 5 seconds hold - Hooklying Clamshell with Resistance  - 1 x daily - 7 x weekly - 3 sets - 10 reps - Standing Posterior Pelvic Tilt  - 3 x daily - 7 x weekly - 3 sets - 10 reps - 5 seconds hold - Seated Passive Cervical Retraction  - 1 x daily - 7 x weekly - 3 sets - 10 reps - 5 seconds hold      PT Short Term Goals - 02/16/22 0941     PT SHORT TERM GOAL #1           Title Pt will be independent with her initial HEP to decrease pain, improve strength, function, and ability to stand and ambulate more comfortably.    Baseline No questions with HEP, has been doing them (02/16/2022)    Time 3    Period Weeks    Status Achieved    Target Date 02/05/22                   PT Long Term Goals - 08/27/22 1420     PT LONG TERM GOAL #1            Title Pt will have a decrease in low back pain to 3/10 or less at worst to promote ability to stand and ambulate with less difficulty.    Baseline 6/10 low back pain at worst for the past 2 months (01/12/2022); 7/10 at most for the past 7 days (standing in the kitchen, more standing than normal becuase pt had guests over from [facility]) ( 02/16/2022);  6-7/10 back pain at worst for the past 7 days. Back pain at worst goes away faster since starting PT. (03/09/2022). R low back pain has not been bothering her. Thoracic back pain is about a 6/10 at worst for the past 7 days (04/02/2022); 4/10 low back pain at worst for the past 7 days (05/06/2022); 4-5/10 at worst for the past 7 days, comes and goes (05/20/2022); 4/10 R low back at most for the past 7 days, 5/10 thoracic back pain at most for the past 7 days (07/02/2022); 08/27/22:  6-7/10 NPS.    Time 8    Period Weeks    Status Partially Met    Target Date 10/22/22          PT LONG TERM GOAL #2           Title Pt will have a decrease in R LE pain to 3/10 or less at worst to promote ability to stand and ambulate will less difficulty.    Baseline 9/10 R LE pain at worst (01/12/2022); 3/10 R sciatic and L5 dermatome pain at most for the past 7 days; has R > L heel pain however which wakes her up at night. (02/16/2022); 4/10 R LE pain at most for the past 7 days. R heel pain however wakes her up at night. (03/12/2022), 3/10 R LE pain at worst for the past 7 days (04/02/2022); R heel bothered her last night (possibly plantar fasciitis), 3/10 R LE pain at most for the past 7 days (  05/06/2022);    Time 8    Period Weeks    Status Achieved    Target Date 05/07/22          PT LONG TERM GOAL #3           Title Pt will improve her FOTO score by at least 10 points as a demontsration of improved function.    Baseline Lumbar Spine FOTO 39 (01/12/2022); 46 (02/16/2022); 52 (03/12/2022); 49 (06/22/2022)    Time 8    Period Weeks    Status Achieved     Target Date 05/07/22          PT LONG TERM GOAL #4           Title Pt will be able to ambulate at least 100 ft independently to promote mobility and return to prior level of function.    Baseline Able to ambulate with rw short distance, about 40 ft with reproduction of pain. (01/12/2022); able to ambulate 120 ft with rw, SBA (02/16/2022); SPC 40 ft, CGA (03/12/2022); Pt able to ambulate 88 ft with SPC on L side (03/31/2022); 210 ft with SPC on L side, rw 200 ft, no pain (05/06/2022); SPC on L side 200 ft, then no AD but CGA 100 ft x 2 with rest break in between bouts, no back pain reported (05/20/2022); No AD, 100 ft, SBA (07/02/2022); no AD, 100' supervision, L antalgic gait.    Time 8    Period Weeks    Status Partially Met    Target Date 10/22/22          PT LONG TERM GOAL #5           Title Pt will improve 30 second STS test to > / = 12 reps to demonstrate clinically significant improvement in LE endurance and strength for transfers and gait for age matched norms.    Baseline 08/27/22: 10 reps. 8th decade of life norm is 12 reps    Time 8    Period Weeks    Status New    Target Date 10/22/22          Additional Long Term Goals           Additional Long Term Goals Yes          PT LONG TERM GOAL #6           Title Pt will improve 2 minute walk test by the MCID of 40' to demonstrate improved capacity to complete household and short community distances at a decreased risk for falls.    Baseline 08/27/22: 263' no AD    Time 8    Period Weeks    Status New    Target Date 10/22/22          PT LONG TERM GOAL #7           Title Pt will improve self selected gait speed to 1 m/s to demonstrate decreased falls risk with short community distances.    Baseline 08/27/22: self selected .75 m/s. Fast paced, .87 m/s    Time 8    Period Weeks    Target Date 10/22/22                            Salem Caster. Fairly IV, PT, DPT Physical Therapist- [facility]  08/30/2022, 4:39 PM

## 2022-09-01 ENCOUNTER — Ambulatory Visit: Payer: Medicare PPO

## 2022-09-01 DIAGNOSIS — R262 Difficulty in walking, not elsewhere classified: Secondary | ICD-10-CM

## 2022-09-01 DIAGNOSIS — Z9181 History of falling: Secondary | ICD-10-CM

## 2022-09-01 DIAGNOSIS — M6281 Muscle weakness (generalized): Secondary | ICD-10-CM

## 2022-09-01 NOTE — Therapy (Signed)
OUTPATIENT PHYSICAL THERAPY TREATMENT NOTE    Patient Name: Joyce Evans MRN: [id number] DOB:1940/12/04, 82 y.o., female Today's Date: 09/01/2022  PCP: Derinda Late, MD  REFERRING PROVIDER: Leonie Green, NP   PT End of Session - 09/01/22 1102     Visit Number 80    Number of Visits 61    Date for PT Re-Evaluation 10/22/22    Authorization Type [employer]    Authorization Time Period 06/16/22-10/09/22    Authorization - Number of Visits 34    Progress Note Due on Visit 47    PT Start Time 1103    PT Stop Time 1147    PT Time Calculation (min) 44 min    Equipment Utilized During Treatment Oxygen    Activity Tolerance Patient tolerated treatment well;No increased pain    Behavior During Therapy WFL for tasks assessed/performed                                                     Past Medical History:  Diagnosis Date   Actinic keratosis    Arthritis    osteo - shoulders, fingers   Atrial fibrillation (HCC)    Dizziness    thinks because of diuretic   Family history of adverse reaction to anesthesia    daughter -PONV   Gallstones 2016, 2017   GERD (gastroesophageal reflux disease)    Glaucoma    Heart murmur    history of   Hypertension    Melanoma ([place]) 04/20/2016   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 Excised: 05/19/2016, margins free.   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   Tricuspid valve disorder    leak   Past Surgical History:  Procedure Laterality Date   ABDOMINAL HYSTERECTOMY  1987   partial   CARDIAC CATHETERIZATION  2010   [facility]   CATARACT EXTRACTION W/PHACO Right 08/28/2015   Procedure: CATARACT EXTRACTION PHACO AND INTRAOCULAR LENS PLACEMENT ([place]);  Surgeon: Leandrew Koyanagi, MD;  Location: [facility];  Service: Ophthalmology;   Laterality: Right;  TORIC   INTRAMEDULLARY (IM) NAIL INTERTROCHANTERIC Right 10/16/2021   Procedure: INTRAMEDULLARY (IM) NAIL INTERTROCHANTRIC;  Surgeon: Renee Harder, MD;  Location: [facility];  Service: Orthopedics;  Laterality: Right;   MELANOMA EXCISION Left 05/19/2016   MIS. Left mid. lat. tricep. 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 S/P IM nail placement for right intertrochanteric hip fracture with Dr. Sharlet Salina on 10/16/2021. Pt slipped on the kithen floor. Pt was getting something from the microwave and pt tripped on her O2 tube. Pt was independent with ambulation prior to her fall. Pt fell before in the woods and pt tripped over a root 3  years ago (no injuries per pt). Pt has been using supplemental O2 for the past 2 years in November. Pt has interstitial lung disease (idiopathic pulmonary fibrosis). Recent breathing test is stable. Back pain began when pt was in Rehab in [place]. Feels like it is sciatical. Pt radiates along the sciatic nerve and R L 5 dermatome. Pt is healing fine as far as her R hip surgery is concerned. Also has R medial knee pain due to arthritis. Had home health PT which involves seated knee extension, hip flexion, standing hip abduction, extension. Pt did some walking wiht a rw. Feels a lot of pain all over. Walking too far is limited due to her O2 situation.  PRECAUTIONS: SpO2 not under 90 %  SUBJECTIVE: Able to perform sit <> stand 12 x in 30 seconds at home. No pian currently. Mid back was bothering her last night. Able to  do 1 hour at home at her recumbent bike     PAIN:  Are you having pain? See subjective       TODAY'S TREATMENT:   09/01/2022   Gait training  At start of session:   SpO2 4 L 97%, HR 64  Gait 150 ft without SPC L hand SBA to CGA  89%, HR 110 (improves to  98 %, HR 66 after rest)   Gait 150 ft without using AD (just holding the SPC) SBA                89%, HR 90 (improves to  98%, HR 66 after rest)   Side stepping 10 ft to the R and 10 ft to the L for 2 sets without AD assist   93%, HR 105   (improves to  99%, HR 71 after rest)   Gait without AD, stepping over 2 mini hurdles with CGA 4x  88%, HR 95   (improves to  98%, HR 63 after rest)    Gait without AD, stepping over 2 mini hurdles with CGA 4x  90%, HR 98   (improves to  98%, HR 61 after rest)    Therapeutic rest breaks utilized for oxygen and muscle recovery.   Improved technique after min verbal, visual, tactile cues.     Therapeutic exercise  Sit <> stand from regular chair with B UE assist 12x quickly.   98%, HR 114  Forward step up with L LE onto 6 inch step with R UE assist 10x2    First set 93%, HR 94 (improves to 97%, HR 68 after rest)    second set 91%, HR 87 (improves to 98%, HR 66 after rest)   Standing hip abduction red band around ankles with B UE assist   R 10x3  99%, HR 93   Standing B shoulder extension with B scapular retraction green band 10x2  To promote thoracic extension and trunk strength to decrease stress to low back.     Therapeutic rest breaks utilized for oxygen and muscle recovery.      Improved exercise technique, movement at target joints, use of target muscles after min to mod verbal, visual, tactile cues.      Response to treatment Pt tolerated session well without aggravation of symptoms      Clinical impression  Continued working on gait without use of AD as well as with stepping over obstacles to help decrease fall risk. Continued working on  functional LE strength as well to promote mobility and decrease fall risk. Pt tolerated session well without  aggravation of low back pain. Pt will benefit from continued skilled physical therapy services to improve ability to ambulate and perform standing tasks with less difficulty as well as to continue to decrease low back pain.      PATIENT EDUCATION: Education details: ther-ex, HEP Person educated: Patient Education method: Explanation, Demonstration, Tactile cues, Verbal cues, and Handouts Education comprehension: verbalized understanding and returned demonstration   HOME EXERCISE PROGRAM: Access Code: [email or web address] URL: [email or web address] Date: 01/20/2022 Prepared by: Joneen Boers  Exercises - Seated Transversus Abdominis Bracing  - 1 x daily - 7 x weekly - 3 sets - 10 reps - 5 seconds hold - Seated Gluteal Sets  - 1 x daily - 7 x weekly - 3 sets - 10 reps - 5 seconds hold - Continued again 06/29/2022: Seated Scapular Retraction  - 1 x daily - 7 x weekly - 3 sets - 10 reps - 5 seconds  hold  Can perform aforementioned exercises in standing now.    - Seated Hip Adduction Isometrics with Ball  - 1 x daily - 7 x weekly - 2 sets - 10 reps - 5 seconds hold - Standing Hip Abduction with Counter Support  - 1 x daily - 7 x weekly - 3 sets - 10 reps - Discontinued on 03/09/2022: Seated Hip Abduction with Resistance  - 1 x daily - 7 x weekly - 2 sets - 10 reps  - Supine Posterior Pelvic Tilt  - 3 x daily - 7 x weekly - 3 sets - 10 reps - 5 seconds hold - Hooklying Clamshell with Resistance  - 1 x daily - 7 x weekly - 3 sets - 10 reps - Standing Posterior Pelvic Tilt  - 3 x daily - 7 x weekly - 3 sets - 10 reps - 5 seconds hold - Seated Passive Cervical Retraction  - 1 x daily - 7 x weekly - 3 sets - 10 reps - 5 seconds hold      PT Short Term Goals - 02/16/22 0941       PT SHORT TERM GOAL #1   Title Pt will be independent with her initial HEP to decrease pain, improve  strength, function, and ability to stand and ambulate more comfortably.    Baseline No questions with HEP, has been doing them (02/16/2022)    Time 3    Period Weeks    Status Achieved    Target Date 02/05/22              PT Long Term Goals - 08/27/22 1420       PT LONG TERM GOAL #1   Title Pt will have a decrease in low back pain to 3/10 or less at worst to promote ability to stand and ambulate with less difficulty.    Baseline 6/10 low back pain at worst for the past 2 months (01/12/2022); 7/10 at most for the past 7 days (standing in the kitchen, more standing than normal becuase pt had guests over from [facility]) ( 02/16/2022);  6-7/10 back pain at worst for the past 7 days. Back pain at worst goes away faster since starting PT. (03/09/2022). R low back pain has not been bothering her. Thoracic back pain is about a 6/10 at worst for the past 7 days (04/02/2022); 4/10 low back pain at worst for the past 7 days (05/06/2022); 4-5/10 at worst for the past 7 days, comes and goes (05/20/2022); 4/10 R low back at most for the past 7 days,  5/10 thoracic back pain at most for the past 7 days (07/02/2022); 08/27/22:  6-7/10 NPS.    Time 8    Period Weeks    Status Partially Met    Target Date 10/22/22      PT LONG TERM GOAL #2   Title Pt will have a decrease in R LE pain to 3/10 or less at worst to promote ability to stand and ambulate will less difficulty.    Baseline 9/10 R LE pain at worst (01/12/2022); 3/10 R sciatic and L5 dermatome pain at most for the past 7 days; has R > L heel pain however which wakes her up at night. (02/16/2022); 4/10 R LE pain at most for the past 7 days. R heel pain however wakes her up at night. (03/12/2022), 3/10 R LE pain at worst for the past 7 days (04/02/2022); R heel bothered her last night (possibly plantar fasciitis), 3/10 R LE pain at most for the past 7 days (05/06/2022);    Time 8    Period Weeks    Status Achieved    Target Date 05/07/22      PT LONG TERM GOAL  #3   Title Pt will improve her FOTO score by at least 10 points as a demontsration of improved function.    Baseline Lumbar Spine FOTO 39 (01/12/2022); 46 (02/16/2022); 52 (03/12/2022); 49 (06/22/2022)    Time 8    Period Weeks    Status Achieved    Target Date 05/07/22      PT LONG TERM GOAL #4   Title Pt will be able to ambulate at least 100 ft independently to promote mobility and return to prior level of function.    Baseline Able to ambulate with rw short distance, about 40 ft with reproduction of pain. (01/12/2022); able to ambulate 120 ft with rw, SBA (02/16/2022); SPC 40 ft, CGA (03/12/2022); Pt able to ambulate 88 ft with SPC on L side (03/31/2022); 210 ft with SPC on L side, rw 200 ft, no pain (05/06/2022); SPC on L side 200 ft, then no AD but CGA 100 ft x 2 with rest break in between bouts, no back pain reported (05/20/2022); No AD, 100 ft, SBA (07/02/2022); no AD, 100' supervision, L antalgic gait.    Time 8    Period Weeks    Status Partially Met    Target Date 10/22/22      PT LONG TERM GOAL #5   Title Pt will improve 30 second STS test to > / = 12 reps to demonstrate clinically significant improvement in LE endurance and strength for transfers and gait for age matched norms.    Baseline 08/27/22: 10 reps. 8th decade of life norm is 12 reps    Time 8    Period Weeks    Status New    Target Date 10/22/22      Additional Long Term Goals   Additional Long Term Goals Yes      PT LONG TERM GOAL #6   Title Pt will improve 2 minute walk test by the MCID of 40' to demonstrate improved capacity to complete household and short community distances at a decreased risk for falls.    Baseline 08/27/22: 263' no AD    Time 8    Period Weeks    Status New    Target Date 10/22/22      PT LONG TERM GOAL #7   Title Pt will improve self selected gait speed to 1 m/s  to demonstrate decreased falls risk with short community distances.    Baseline 08/27/22: self selected .75 m/s. Fast paced, .87 m/s     Time 8    Period Weeks    Target Date 10/22/22              Plan - 09/01/22 1204     Clinical Impression Statement Continued working on gait without use of AD as well as with stepping over obstacles to help decrease fall risk. Continued working on functional LE strength as well to promote mobility and decrease fall risk. Pt tolerated session well without aggravation of low back pain. Pt will benefit from continued skilled physical therapy services to improve ability to ambulate and perform standing tasks with less difficulty as well as to continue to decrease low back pain.    Personal Factors and Comorbidities Comorbidity 3+;Age;Time since onset of injury/illness/exacerbation;Fitness    Comorbidities Arthritis, atrial fibrillation, HTN, idiopathic pulmonary fibrosis    Examination-Activity Limitations Stairs;Bed Mobility;Stand;Sit;Lift;Transfers;Locomotion Level;Carry    Stability/Clinical Decision Making Stable/Uncomplicated    Rehab Potential Fair    PT Frequency 2x / week    PT Duration 8 weeks    PT Treatment/Interventions Therapeutic activities;Therapeutic exercise;Gait training;Balance training;Neuromuscular re-education;Patient/family education;Manual techniques;Dry needling;Electrical Stimulation;Iontophoresis 73m/ml Dexamethasone    PT Next Visit Plan Posture, thoracic extension, trunk and glute strength, lumbopelvic control during gait, manual techniques, modalities PRN    PT Home Exercise Plan Medbridge Access Code: [email or web address]   Consulted and Agree with Plan of Care Patient                                           MJoneen BoersPT, DPT  09/01/2022, 12:05 PM

## 2022-09-03 ENCOUNTER — Ambulatory Visit: Payer: Medicare PPO

## 2022-09-03 DIAGNOSIS — M6281 Muscle weakness (generalized): Secondary | ICD-10-CM

## 2022-09-03 DIAGNOSIS — Z9181 History of falling: Secondary | ICD-10-CM

## 2022-09-03 DIAGNOSIS — R262 Difficulty in walking, not elsewhere classified: Secondary | ICD-10-CM | POA: Diagnosis not present

## 2022-09-03 NOTE — Therapy (Signed)
OUTPATIENT PHYSICAL THERAPY TREATMENT NOTE And Progress Report (07/02/2022 - 09/03/2022)    Patient Name: Joyce Evans MRN: [id number] DOB:1941/01/14, 82 y.o., female Today's Date: 09/03/2022  PCP: Derinda Late, MD  REFERRING PROVIDER: Leonie Green, NP   PT End of Session - 09/03/22 0935     Visit Number 56    Number of Visits 31    Date for PT Re-Evaluation 10/22/22    Authorization Type [employer]    Authorization Time Period 06/16/22-10/09/22    Authorization - Number of Visits 34    Progress Note Due on Visit 66    PT Start Time 0935    PT Stop Time 1015    PT Time Calculation (min) 40 min    Equipment Utilized During Treatment Oxygen    Activity Tolerance Patient tolerated treatment well;No increased pain    Behavior During Therapy WFL for tasks assessed/performed                                                      Past Medical History:  Diagnosis Date   Actinic keratosis    Arthritis    osteo - shoulders, fingers   Atrial fibrillation (HCC)    Dizziness    thinks because of diuretic   Family history of adverse reaction to anesthesia    daughter -PONV   Gallstones 2016, 2017   GERD (gastroesophageal reflux disease)    Glaucoma    Heart murmur    history of   Hypertension    Melanoma ([place]) 04/20/2016   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 Excised: 05/19/2016, margins free.   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   Tricuspid valve disorder    leak   Past Surgical History:  Procedure Laterality Date   ABDOMINAL HYSTERECTOMY  1987   partial   CARDIAC CATHETERIZATION  2010   [facility]   CATARACT EXTRACTION W/PHACO Right 08/28/2015   Procedure: CATARACT EXTRACTION PHACO AND INTRAOCULAR LENS PLACEMENT ([place]);  Surgeon: Leandrew Koyanagi, MD;  Location:  [facility];  Service: Ophthalmology;  Laterality: Right;  TORIC   INTRAMEDULLARY (IM) NAIL INTERTROCHANTERIC Right 10/16/2021   Procedure: INTRAMEDULLARY (IM) NAIL INTERTROCHANTRIC;  Surgeon: Renee Harder, MD;  Location: [facility];  Service: Orthopedics;  Laterality: Right;   MELANOMA EXCISION Left 05/19/2016   MIS. Left mid. lat. tricep. 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 S/P IM nail placement for right intertrochanteric hip fracture with Dr. Sharlet Salina on 10/16/2021. Pt slipped on the kithen floor. Pt was getting something from the microwave and pt tripped on her O2 tube. Pt was independent with ambulation prior to her fall. Pt fell before in the woods  and pt tripped over a root 3 years ago (no injuries per pt). Pt has been using supplemental O2 for the past 2 years in November. Pt has interstitial lung disease (idiopathic pulmonary fibrosis). Recent breathing test is stable. Back pain began when pt was in Rehab in [facility]. Feels like it is sciatical. Pt radiates along the sciatic nerve and R L 5 dermatome. Pt is healing fine as far as her R hip surgery is concerned. Also has R medial knee pain due to arthritis. Had home health PT which involves seated knee extension, hip flexion, standing hip abduction, extension. Pt did some walking wiht a rw. Feels a lot of pain all over. Walking too far is limited due to her O2 situation.  PRECAUTIONS: SpO2 not under 90 %  SUBJECTIVE: Blood pressure yesterday was 177/95 (around 9 pm) after taking amlodine an  hour before. Feeling a littld wobbly and dizzy this morning which is usually common for her. Did an hour on the bike last night and her stand up and sit downs. No pain currently.      PAIN:  Are you having pain? See subjective       TODAY'S TREATMENT:   09/03/2022    Therapeutic exercise At start of session:  Blood pressure, L arm sitting, mechanically taken, normal cuff 150/81, HR 65  100%, HR  85 on 4 L O2  Sit <> stand from regular chair height 30 seconds quickly  88%, HR 99 (improves to 98%, HR 77 after rest)   Gait x 10 meters with SPC at self selected gait speed   13.13 seconds and 12.75 seconds (12.94 seconds average; 0.77 m/s average selected gait speed)   98%, HR 88 after rest.   Reclined   posterior pelvic tilt 10x5 seconds for 3 sets to promote trunk strength     Mini crunch 10x3   Seated chin tucks to promote thoracic extension to decrease stress to mid and low back 10x5 seconds for 2 sets    Therapeutic rest breaks utilized for oxygen and muscle recovery.    Improved exercise technique, movement at target joints, use of target muscles after min to mod verbal, visual, tactile cues.      Response to treatment Pt tolerated session well without aggravation of symptoms      Clinical impression  Pt demonstrates improved functional LE strength with 30 second sit <> stand from regular chair height without UE assist compared to previous measurement meeting her goal of 12 repetitions. Pt however demonstrates increase lumbar extension compensation when performing the transfers so worked on abdominal strengthening and thoracic extension today to help address.  Pt tolerated session well without aggravation of low back pain. Pt will benefit from continued skilled physical therapy services to improve ability to ambulate and perform standing tasks with less difficulty as well as to continue to decrease low back pain.      PATIENT EDUCATION: Education  details: ther-ex, HEP Person educated: Patient Education method: Explanation, Demonstration, Tactile cues, Verbal cues, and Handouts Education comprehension: verbalized understanding and returned demonstration   HOME EXERCISE PROGRAM: Access Code: [email or web address] URL: [email or web address] Date: 01/20/2022 Prepared by: Joneen Boers  Exercises - Seated Transversus Abdominis Bracing  - 1 x daily - 7 x weekly - 3 sets - 10 reps - 5 seconds hold - Seated Gluteal Sets  - 1 x daily - 7 x weekly - 3 sets - 10 reps - 5 seconds hold - Continued again 06/29/2022: Seated Scapular Retraction  -  1 x daily - 7 x weekly - 3 sets - 10 reps - 5 seconds  hold  Can perform aforementioned exercises in standing now.    - Seated Hip Adduction Isometrics with Ball  - 1 x daily - 7 x weekly - 2 sets - 10 reps - 5 seconds hold - Standing Hip Abduction with Counter Support  - 1 x daily - 7 x weekly - 3 sets - 10 reps - Discontinued on 03/09/2022: Seated Hip Abduction with Resistance  - 1 x daily - 7 x weekly - 2 sets - 10 reps  - Supine Posterior Pelvic Tilt  - 3 x daily - 7 x weekly - 3 sets - 10 reps - 5 seconds hold - Hooklying Clamshell with Resistance  - 1 x daily - 7 x weekly - 3 sets - 10 reps - Standing Posterior Pelvic Tilt  - 3 x daily - 7 x weekly - 3 sets - 10 reps - 5 seconds hold - Seated Passive Cervical Retraction  - 1 x daily - 7 x weekly - 3 sets - 10 reps - 5 seconds hold      PT Short Term Goals - 02/16/22 0941       PT SHORT TERM GOAL #1   Title Pt will be independent with her initial HEP to decrease pain, improve strength, function, and ability to stand and ambulate more comfortably.    Baseline No questions with HEP, has been doing them (02/16/2022)    Time 3    Period Weeks    Status Achieved    Target Date 02/05/22              PT Long Term Goals - 09/03/22 0943       PT LONG TERM GOAL #1   Title Pt will have a decrease in low back pain to 3/10 or less at worst  to promote ability to stand and ambulate with less difficulty.    Baseline 6/10 low back pain at worst for the past 2 months (01/12/2022); 7/10 at most for the past 7 days (standing in the kitchen, more standing than normal becuase pt had guests over from [facility]) ( 02/16/2022);  6-7/10 back pain at worst for the past 7 days. Back pain at worst goes away faster since starting PT. (03/09/2022). R low back pain has not been bothering her. Thoracic back pain is about a 6/10 at worst for the past 7 days (04/02/2022); 4/10 low back pain at worst for the past 7 days (05/06/2022); 4-5/10 at worst for the past 7 days, comes and goes (05/20/2022); 4/10 R low back at most for the past 7 days, 5/10 thoracic back pain at most for the past 7 days (07/02/2022); 08/27/22:  6-7/10 NPS. 4/10 low back pain at most for the past 7 days (original area of pain), 6/10 mid back pain at worst for the past 7 days, but fine when lying down (09/03/2022)    Time 8    Period Weeks    Status Partially Met    Target Date 10/22/22      PT LONG TERM GOAL #2   Title Pt will have a decrease in R LE pain to 3/10 or less at worst to promote ability to stand and ambulate will less difficulty.    Baseline 9/10 R LE pain at worst (01/12/2022); 3/10 R sciatic and L5 dermatome pain at most for the past 7 days; has R > L heel pain however which  wakes her up at night. (02/16/2022); 4/10 R LE pain at most for the past 7 days. R heel pain however wakes her up at night. (03/12/2022), 3/10 R LE pain at worst for the past 7 days (04/02/2022); R heel bothered her last night (possibly plantar fasciitis), 3/10 R LE pain at most for the past 7 days (05/06/2022);    Time 8    Period Weeks    Status Achieved    Target Date 05/07/22      PT LONG TERM GOAL #3   Title Pt will improve her FOTO score by at least 10 points as a demontsration of improved function.    Baseline Lumbar Spine FOTO 39 (01/12/2022); 46 (02/16/2022); 52 (03/12/2022); 49 (06/22/2022)    Time 8     Period Weeks    Status Achieved    Target Date 05/07/22      PT LONG TERM GOAL #4   Title Pt will be able to ambulate at least 100 ft independently to promote mobility and return to prior level of function.    Baseline Able to ambulate with rw short distance, about 40 ft with reproduction of pain. (01/12/2022); able to ambulate 120 ft with rw, SBA (02/16/2022); SPC 40 ft, CGA (03/12/2022); Pt able to ambulate 88 ft with SPC on L side (03/31/2022); 210 ft with SPC on L side, rw 200 ft, no pain (05/06/2022); SPC on L side 200 ft, then no AD but CGA 100 ft x 2 with rest break in between bouts, no back pain reported (05/20/2022); No AD, 100 ft, SBA (07/02/2022); no AD, 100' supervision, L antalgic gait.    Time 8    Period Weeks    Status Partially Met    Target Date 10/22/22      PT LONG TERM GOAL #5   Title Pt will improve 30 second STS test to > / = 12 reps to demonstrate clinically significant improvement in LE endurance and strength for transfers and gait for age matched norms.    Baseline 08/27/22: 10 reps. 8th decade of life norm is 12 reps; 12 reps in 30 seconds, no UE assist (09/03/2022)    Time 8    Period Weeks    Status Achieved    Target Date 10/22/22      PT LONG TERM GOAL #6   Title Pt will improve 2 minute walk test by the MCID of 40' to demonstrate improved capacity to complete household and short community distances at a decreased risk for falls.    Baseline 08/27/22: 263' no AD    Time 8    Period Weeks    Status Deferred    Target Date 10/22/22      PT LONG TERM GOAL #7   Title Pt will improve self selected gait speed to 1 m/s to demonstrate decreased falls risk with short community distances.    Baseline 08/27/22: self selected .75 m/s. Fast paced, .87 m/s; self selected 0.77 m/s (09/03/2022)    Time 8    Period Weeks    Status On-going    Target Date 10/22/22              Plan - 09/03/22 1300     Clinical Impression Statement Pt demonstrates improved functional LE  strength with 30 second sit <> stand from regular chair height without UE assist compared to previous measurement meeting her goal of 12 repetitions. Pt however demonstrates increase lumbar extension compensation when performing the transfers so worked on  abdominal strengthening and thoracic extension today to help address.  Pt tolerated session well without aggravation of low back pain. Pt will benefit from continued skilled physical therapy services to improve ability to ambulate and perform standing tasks with less difficulty as well as to continue to decrease low back pain.    Personal Factors and Comorbidities Comorbidity 3+;Age;Time since onset of injury/illness/exacerbation;Fitness    Comorbidities Arthritis, atrial fibrillation, HTN, idiopathic pulmonary fibrosis    Examination-Activity Limitations Stairs;Bed Mobility;Stand;Sit;Lift;Transfers;Locomotion Level;Carry    Stability/Clinical Decision Making Stable/Uncomplicated    Clinical Decision Making Low    Rehab Potential Fair    PT Frequency 2x / week    PT Duration 8 weeks    PT Treatment/Interventions Therapeutic activities;Therapeutic exercise;Gait training;Balance training;Neuromuscular re-education;Patient/family education;Manual techniques;Dry needling;Electrical Stimulation;Iontophoresis 33m/ml Dexamethasone    PT Next Visit Plan Posture, thoracic extension, trunk and glute strength, lumbopelvic control during gait, manual techniques, modalities PRN    PT Home Exercise Plan Medbridge Access Code: [email or web address]   Consulted and Agree with Plan of Care Patient                                           Thank you for your referral.  MJoneen BoersPT, DPT  09/03/2022, 1:08 PM

## 2022-09-07 ENCOUNTER — Ambulatory Visit: Payer: Medicare PPO

## 2022-09-07 DIAGNOSIS — R262 Difficulty in walking, not elsewhere classified: Secondary | ICD-10-CM | POA: Diagnosis not present

## 2022-09-07 DIAGNOSIS — M5417 Radiculopathy, lumbosacral region: Secondary | ICD-10-CM

## 2022-09-07 DIAGNOSIS — M5459 Other low back pain: Secondary | ICD-10-CM

## 2022-09-07 DIAGNOSIS — M5431 Sciatica, right side: Secondary | ICD-10-CM

## 2022-09-07 DIAGNOSIS — Z9181 History of falling: Secondary | ICD-10-CM

## 2022-09-07 DIAGNOSIS — M6281 Muscle weakness (generalized): Secondary | ICD-10-CM

## 2022-09-07 NOTE — Therapy (Signed)
OUTPATIENT PHYSICAL THERAPY TREATMENT NOTE     Patient Name: Joyce Evans MRN: [id number] DOB:08-21-1940, 82 y.o., female Today's Date: 09/07/2022  PCP: Derinda Late, MD  REFERRING PROVIDER: Leonie Green, NP   PT End of Session - 09/07/22 1634     Visit Number 51    Number of Visits 6    Date for PT Re-Evaluation 10/22/22    Authorization Type [employer]    Authorization Time Period 06/16/22-10/09/22    Authorization - Visit Number 57    Authorization - Number of Visits 34    Progress Note Due on Visit 43    PT Start Time 1634    PT Stop Time 1715    PT Time Calculation (min) 41 min    Equipment Utilized During Treatment Oxygen    Activity Tolerance Patient tolerated treatment well;No increased pain    Behavior During Therapy WFL for tasks assessed/performed                                                       Past Medical History:  Diagnosis Date   Actinic keratosis    Arthritis    osteo - shoulders, fingers   Atrial fibrillation (HCC)    Dizziness    thinks because of diuretic   Family history of adverse reaction to anesthesia    daughter -PONV   Gallstones 2016, 2017   GERD (gastroesophageal reflux disease)    Glaucoma    Heart murmur    history of   Hypertension    Melanoma ([place]) 04/20/2016   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 Excised: 05/19/2016, margins free.   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   Tricuspid valve disorder    leak   Past Surgical History:  Procedure Laterality Date   ABDOMINAL HYSTERECTOMY  1987   partial   CARDIAC CATHETERIZATION  2010   [facility]   CATARACT EXTRACTION W/PHACO Right 08/28/2015   Procedure: CATARACT EXTRACTION PHACO AND INTRAOCULAR LENS PLACEMENT ([place]);  Surgeon: Leandrew Koyanagi, MD;  Location: [facility];  Service: Ophthalmology;  Laterality: Right;  TORIC   INTRAMEDULLARY (IM) NAIL INTERTROCHANTERIC Right 10/16/2021   Procedure: INTRAMEDULLARY (IM) NAIL INTERTROCHANTRIC;  Surgeon: Renee Harder, MD;  Location: [facility];  Service: Orthopedics;  Laterality: Right;   MELANOMA EXCISION Left 05/19/2016   MIS. Left mid. lat. tricep. 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 S/P IM nail placement for right intertrochanteric hip fracture with Dr. Sharlet Salina on 10/16/2021. Pt slipped on the kithen floor. Pt was getting something from the microwave and pt tripped on her  O2 tube. Pt was independent with ambulation prior to her fall. Pt fell before in the woods and pt tripped over a root 3 years ago (no injuries per pt). Pt has been using supplemental O2 for the past 2 years in November. Pt has interstitial lung disease (idiopathic pulmonary fibrosis). Recent breathing test is stable. Back pain began when pt was in Rehab in [place] [facility]. Feels like it is sciatical. Pt radiates along the sciatic nerve and R L 5 dermatome. Pt is healing fine as far as her R hip surgery is concerned. Also has R medial knee pain due to arthritis. Had home health PT which involves seated knee extension, hip flexion, standing hip abduction, extension. Pt did some walking wiht a rw. Feels a lot of pain all over. Walking too far is limited due to her O2 situation.  PRECAUTIONS: SpO2 not under 90  %  SUBJECTIVE:Back is fine right now. Feels like she has inflammation in her body but cannot take anti inflammatories due to elequis. No back pain currently. L hip bothered her yesterday.      PAIN:  Are you having pain? See subjective       TODAY'S TREATMENT:   09/07/2022    Therapeutic exercise At start of session:    99%, HR  55 on 4 L O2   Reclined   posterior pelvic tilt 10x, then 10x5 seconds for 3 sets to promote trunk strength     Then with march 10x3 each LE     Mini crunch 10x3    99%, HR 52 after aforementioned exercise   Hooklying hip abduction yellow band around mid leg (stationary part on ankle)   R leg straight 10x3   L leg straight 10x3   Good B glute med muscle reported   98%, HR 64   Mini crunch    to the L 5x4   To the R 5x4   98%, HR 67   Clamshell red band 10x5  seconds for 3 sets   Hooklying   Hip adduction isometric 10x5 seconds     100%, HR 55 after session    Therapeutic rest breaks utilized for oxygen and muscle recovery.    Improved exercise technique, movement at target joints, use of target muscles after min to mod verbal, visual, tactile cues.      Response to treatment Pt tolerated session well without aggravation of symptoms      Clinical impression  Worked predominantly on trunk and glute med strengthening to decrease lumbar extension posture and stress during standing tasks. Good muscle use reported with exercises. Good O2 saturation while performing reclined exercises. No back pain reported throughout session. Pt will benefit from continued skilled physical therapy services to improve ability to ambulate and perform standing tasks with less difficulty as well as to continue to decrease low back pain.      PATIENT EDUCATION: Education details: ther-ex, HEP Person educated: Patient Education method: Explanation, Demonstration, Tactile cues, Verbal cues, and Handouts Education comprehension: verbalized  understanding and returned demonstration   HOME EXERCISE PROGRAM: Access Code: [email or web address] URL: [email or web address] Date: 01/20/2022 Prepared by: Joneen Boers  Exercises - Seated Transversus Abdominis Bracing  - 1 x daily - 7 x weekly - 3 sets - 10 reps - 5 seconds hold - Seated Gluteal Sets  - 1 x daily - 7 x weekly - 3 sets - 10 reps - 5 seconds hold - Continued again 06/29/2022: Seated Scapular Retraction  - 1 x daily - 7  x weekly - 3 sets - 10 reps - 5 seconds  hold  Can perform aforementioned exercises in standing now.    - Seated Hip Adduction Isometrics with Ball  - 1 x daily - 7 x weekly - 2 sets - 10 reps - 5 seconds hold - Standing Hip Abduction with Counter Support  - 1 x daily - 7 x weekly - 3 sets - 10 reps - Discontinued on 03/09/2022: Seated Hip Abduction with Resistance  - 1 x daily - 7 x weekly - 2 sets - 10 reps  - Supine Posterior Pelvic Tilt  - 3 x daily - 7 x weekly - 3 sets - 10 reps - 5 seconds hold - Hooklying Clamshell with Resistance  - 1 x daily - 7 x weekly - 3 sets - 10 reps - Standing Posterior Pelvic Tilt  - 3 x daily - 7 x weekly - 3 sets - 10 reps - 5 seconds hold - Seated Passive Cervical Retraction  - 1 x daily - 7 x weekly - 3 sets - 10 reps - 5 seconds hold      PT Short Term Goals - 02/16/22 0941       PT SHORT TERM GOAL #1   Title Pt will be independent with her initial HEP to decrease pain, improve strength, function, and ability to stand and ambulate more comfortably.    Baseline No questions with HEP, has been doing them (02/16/2022)    Time 3    Period Weeks    Status Achieved    Target Date 02/05/22              PT Long Term Goals - 09/03/22 0943       PT LONG TERM GOAL #1   Title Pt will have a decrease in low back pain to 3/10 or less at worst to promote ability to stand and ambulate with less difficulty.    Baseline 6/10 low back pain at worst for the past 2 months (01/12/2022); 7/10 at most for the past 7 days  (standing in the kitchen, more standing than normal becuase pt had guests over from [facility]) ( 02/16/2022);  6-7/10 back pain at worst for the past 7 days. Back pain at worst goes away faster since starting PT. (03/09/2022). R low back pain has not been bothering her. Thoracic back pain is about a 6/10 at worst for the past 7 days (04/02/2022); 4/10 low back pain at worst for the past 7 days (05/06/2022); 4-5/10 at worst for the past 7 days, comes and goes (05/20/2022); 4/10 R low back at most for the past 7 days, 5/10 thoracic back pain at most for the past 7 days (07/02/2022); 08/27/22:  6-7/10 NPS. 4/10 low back pain at most for the past 7 days (original area of pain), 6/10 mid back pain at worst for the past 7 days, but fine when lying down (09/03/2022)    Time 8    Period Weeks    Status Partially Met    Target Date 10/22/22      PT LONG TERM GOAL #2   Title Pt will have a decrease in R LE pain to 3/10 or less at worst to promote ability to stand and ambulate will less difficulty.    Baseline 9/10 R LE pain at worst (01/12/2022); 3/10 R sciatic and L5 dermatome pain at most for the past 7 days; has R > L heel pain however which wakes her up at night. (  02/16/2022); 4/10 R LE pain at most for the past 7 days. R heel pain however wakes her up at night. (03/12/2022), 3/10 R LE pain at worst for the past 7 days (04/02/2022); R heel bothered her last night (possibly plantar fasciitis), 3/10 R LE pain at most for the past 7 days (05/06/2022);    Time 8    Period Weeks    Status Achieved    Target Date 05/07/22      PT LONG TERM GOAL #3   Title Pt will improve her FOTO score by at least 10 points as a demontsration of improved function.    Baseline Lumbar Spine FOTO 39 (01/12/2022); 46 (02/16/2022); 52 (03/12/2022); 49 (06/22/2022)    Time 8    Period Weeks    Status Achieved    Target Date 05/07/22      PT LONG TERM GOAL #4   Title Pt will be able to ambulate at least 100 ft independently to promote  mobility and return to prior level of function.    Baseline Able to ambulate with rw short distance, about 40 ft with reproduction of pain. (01/12/2022); able to ambulate 120 ft with rw, SBA (02/16/2022); SPC 40 ft, CGA (03/12/2022); Pt able to ambulate 88 ft with SPC on L side (03/31/2022); 210 ft with SPC on L side, rw 200 ft, no pain (05/06/2022); SPC on L side 200 ft, then no AD but CGA 100 ft x 2 with rest break in between bouts, no back pain reported (05/20/2022); No AD, 100 ft, SBA (07/02/2022); no AD, 100' supervision, L antalgic gait.    Time 8    Period Weeks    Status Partially Met    Target Date 10/22/22      PT LONG TERM GOAL #5   Title Pt will improve 30 second STS test to > / = 12 reps to demonstrate clinically significant improvement in LE endurance and strength for transfers and gait for age matched norms.    Baseline 08/27/22: 10 reps. 8th decade of life norm is 12 reps; 12 reps in 30 seconds, no UE assist (09/03/2022)    Time 8    Period Weeks    Status Achieved    Target Date 10/22/22      PT LONG TERM GOAL #6   Title Pt will improve 2 minute walk test by the MCID of 40' to demonstrate improved capacity to complete household and short community distances at a decreased risk for falls.    Baseline 08/27/22: 263' no AD    Time 8    Period Weeks    Status Deferred    Target Date 10/22/22      PT LONG TERM GOAL #7   Title Pt will improve self selected gait speed to 1 m/s to demonstrate decreased falls risk with short community distances.    Baseline 08/27/22: self selected .75 m/s. Fast paced, .87 m/s; self selected 0.77 m/s (09/03/2022)    Time 8    Period Weeks    Status On-going    Target Date 10/22/22              Plan - 09/07/22 1629     Clinical Impression Statement Worked predominantly on trunk and glute med strengthening to decrease lumbar extension posture and stress during standing tasks. Good muscle use reported with exercises. Good O2 saturation while performing  reclined exercises. No back pain reported throughout session. Pt will benefit from continued skilled physical therapy services to improve  ability to ambulate and perform standing tasks with less difficulty as well as to continue to decrease low back pain.    Personal Factors and Comorbidities Comorbidity 3+;Age;Time since onset of injury/illness/exacerbation;Fitness    Comorbidities Arthritis, atrial fibrillation, HTN, idiopathic pulmonary fibrosis    Examination-Activity Limitations Stairs;Bed Mobility;Stand;Sit;Lift;Transfers;Locomotion Level;Carry    Stability/Clinical Decision Making Stable/Uncomplicated    Rehab Potential Fair    PT Frequency 2x / week    PT Duration 8 weeks    PT Treatment/Interventions Therapeutic activities;Therapeutic exercise;Gait training;Balance training;Neuromuscular re-education;Patient/family education;Manual techniques;Dry needling;Electrical Stimulation;Iontophoresis '4mg'$ /ml Dexamethasone    PT Next Visit Plan Posture, thoracic extension, trunk and glute strength, lumbopelvic control during gait, manual techniques, modalities PRN    PT Home Exercise Plan Medbridge Access Code: [email or web address]    Consulted and Agree with Plan of Care Patient                                             Joneen Boers PT, DPT  09/07/2022, 5:22 PM

## 2022-09-09 ENCOUNTER — Encounter: Payer: Medicare PPO | Admitting: Physical Therapy

## 2022-09-10 ENCOUNTER — Ambulatory Visit: Payer: Medicare PPO

## 2022-09-10 DIAGNOSIS — Z9181 History of falling: Secondary | ICD-10-CM

## 2022-09-10 DIAGNOSIS — R262 Difficulty in walking, not elsewhere classified: Secondary | ICD-10-CM

## 2022-09-10 DIAGNOSIS — M5417 Radiculopathy, lumbosacral region: Secondary | ICD-10-CM

## 2022-09-10 DIAGNOSIS — M6281 Muscle weakness (generalized): Secondary | ICD-10-CM

## 2022-09-10 DIAGNOSIS — M5431 Sciatica, right side: Secondary | ICD-10-CM

## 2022-09-10 DIAGNOSIS — M5459 Other low back pain: Secondary | ICD-10-CM

## 2022-09-10 NOTE — Therapy (Signed)
OUTPATIENT PHYSICAL THERAPY TREATMENT NOTE     Patient Name: Joyce Evans MRN: [id number] DOB:May 22, 1941, 82 y.o., female Today's Date: 09/10/2022  PCP: Derinda Late, MD  REFERRING PROVIDER: Leonie Green, NP   PT End of Session - 09/10/22 1547     Visit Number 61    Number of Visits 73    Date for PT Re-Evaluation 10/22/22    Authorization Type [employer]    Authorization Time Period 06/16/22-10/09/22    Authorization - Visit Number 18    Authorization - Number of Visits 34    Progress Note Due on Visit 39    PT Start Time [id number]    PT Stop Time 1630    PT Time Calculation (min) 43 min    Equipment Utilized During Treatment Oxygen    Activity Tolerance Patient tolerated treatment well;No increased pain    Behavior During Therapy WFL for tasks assessed/performed                                                        Past Medical History:  Diagnosis Date   Actinic keratosis    Arthritis    osteo - shoulders, fingers   Atrial fibrillation (HCC)    Dizziness    thinks because of diuretic   Family history of adverse reaction to anesthesia    daughter -PONV   Gallstones 2016, 2017   GERD (gastroesophageal reflux disease)    Glaucoma    Heart murmur    history of   Hypertension    Melanoma ([place]) 04/20/2016   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 Excised: 05/19/2016, margins free.   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   Tricuspid valve disorder    leak   Past Surgical History:  Procedure Laterality Date   ABDOMINAL HYSTERECTOMY  1987   partial   CARDIAC CATHETERIZATION  2010   [facility]   CATARACT EXTRACTION W/PHACO Right 08/28/2015   Procedure: CATARACT EXTRACTION PHACO AND INTRAOCULAR LENS PLACEMENT ([place]);  Surgeon: Leandrew Koyanagi, MD;  Location: [facility];  Service: Ophthalmology;  Laterality: Right;  TORIC   INTRAMEDULLARY (IM) NAIL INTERTROCHANTERIC Right 10/16/2021   Procedure: INTRAMEDULLARY (IM) NAIL INTERTROCHANTRIC;  Surgeon: Renee Harder, MD;  Location: [facility];  Service: Orthopedics;  Laterality: Right;   MELANOMA EXCISION Left 05/19/2016   MIS. Left mid. lat. tricep. 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 S/P IM nail placement for right intertrochanteric hip fracture with Dr. Sharlet [place]ina on 10/16/2021. Pt slipped on the kithen floor. Pt was getting something from the microwave and pt tripped on  her O2 tube. Pt was independent with ambulation prior to her fall. Pt fell before in the woods and pt tripped over a root 3 years ago (no injuries per pt). Pt has been using supplemental O2 for the past 2 years in November. Pt has interstitial lung disease (idiopathic pulmonary fibrosis). Recent breathing test is stable. Back pain began when pt was in Rehab in [facility]. Feels like it is sciatical. Pt radiates along the sciatic nerve and R L 5 dermatome. Pt is healing fine as far as her R hip surgery is concerned. Also has R medial knee pain due to arthritis. Had home health PT which involves seated knee extension, hip flexion, standing hip abduction, extension. Pt did some walking wiht a rw. Feels a lot of pain all over. Walking too far is limited due to her O2 situation.  PRECAUTIONS: SpO2 not under 90  %  SUBJECTIVE:Got an excellent report with the pulmonologist. Her breathing test had improved. Doctor said her fibrosis is no worse. Pulmonologist prescribed her mucinex for her mucus. Her cardiologist also says that her heart failure is not bad (which was a while ago, goes back to her May 2024). L hip and thigh bothered her since last session. Feels like the supine/reclined marches might have played a factor.       PAIN:  Are you having pain? See subjective       TODAY'S TREATMENT:   09/10/2022    Therapeutic exercise At start of session:   Blood pressure, L arm sitting, mechanically taken, normal cuff: 174/78, HR 58.   Just a little dizziness per pt.    179/65, HR 53   R arm sitting: 150/86, HR 65  99%, HR  57 on 3 L O2  Seated trunk flexion  Decreases L L5 pain   Improved blood pressure after manual therapy to low back to decrease L thigh pain And no dizziness reported    Reclined     Hooklying   posterior pelvic tilt  10x5 seconds for 3 sets to promote trunk strength       B scapular retraction to promote thoracic extension 10x5 seconds for 2 sets    L arm propped up with towels.     Chin tucks 4x5 seconds. L upper trap discomfort      Therapeutic rest breaks utilized for oxygen and muscle recovery.    Improved exercise technique, movement at target joints, use of target muscles after min to mod verbal, visual, tactile cues.    Manual therapy  Seated STM low back paraspinal muscle tension   No L L5 pain reported afterwards in hip and thigh  Blood pressure, L arm sitting, mechanically taken, normal cuff: 159/67, HR 52     Response to treatment Pt tolerated session well without aggravation of symptoms      Clinical impression  Decreased L hip and thigh pain (along L5 dermatome) after manual therapy to decrease lumbar paraspinal muscle tension to low back, resulting in decreased blood pressure and no dizziness afterwards. Worked on gentle  trunk strengthening and thoracic extension in the reclined position to help decrease lumbar paraspinal muscle tension to low back. No pain or dizziness reported after session. Pt will benefit from continued skilled physical therapy services to improve ability to ambulate and perform standing tasks with less difficulty as well as to continue to decrease low back pain.      PATIENT EDUCATION: Education details: ther-ex, HEP Person educated: Patient Education method: Explanation, Demonstration, Tactile cues,  Verbal cues, and Handouts Education comprehension: verbalized understanding and returned demonstration   HOME EXERCISE PROGRAM: Access Code: [email or web address] URL: [email or web address] Date: 01/20/2022 Prepared by: Joneen Boers  Exercises - Seated Transversus Abdominis Bracing  - 1 x daily - 7 x weekly - 3 sets - 10 reps - 5 seconds hold - Seated Gluteal Sets  - 1 x daily - 7 x weekly - 3 sets - 10 reps - 5 seconds hold - Continued again 06/29/2022: Seated Scapular Retraction  - 1 x daily - 7 x weekly - 3 sets - 10 reps - 5 seconds  hold  Can perform aforementioned exercises in standing now.    - Seated Hip Adduction Isometrics with Ball  - 1 x daily - 7 x weekly - 2 sets - 10 reps - 5 seconds hold - Standing Hip Abduction with Counter Support  - 1 x daily - 7 x weekly - 3 sets - 10 reps - Discontinued on 03/09/2022: Seated Hip Abduction with Resistance  - 1 x daily - 7 x weekly - 2 sets - 10 reps  - Supine Posterior Pelvic Tilt  - 3 x daily - 7 x weekly - 3 sets - 10 reps - 5 seconds hold - Hooklying Clamshell with Resistance  - 1 x daily - 7 x weekly - 3 sets - 10 reps - Standing Posterior Pelvic Tilt  - 3 x daily - 7 x weekly - 3 sets - 10 reps - 5 seconds hold - Seated Passive Cervical Retraction  - 1 x daily - 7 x weekly - 3 sets - 10 reps - 5 seconds hold      PT Short Term Goals - 02/16/22 0941       PT SHORT TERM GOAL #1   Title Pt will be independent with her  initial HEP to decrease pain, improve strength, function, and ability to stand and ambulate more comfortably.    Baseline No questions with HEP, has been doing them (02/16/2022)    Time 3    Period Weeks    Status Achieved    Target Date 02/05/22              PT Long Term Goals - 09/03/22 0943       PT LONG TERM GOAL #1   Title Pt will have a decrease in low back pain to 3/10 or less at worst to promote ability to stand and ambulate with less difficulty.    Baseline 6/10 low back pain at worst for the past 2 months (01/12/2022); 7/10 at most for the past 7 days (standing in the kitchen, more standing than normal becuase pt had guests over from [facility]) ( 02/16/2022);  6-7/10 back pain at worst for the past 7 days. Back pain at worst goes away faster since starting PT. (03/09/2022). R low back pain has not been bothering her. Thoracic back pain is about a 6/10 at worst for the past 7 days (04/02/2022); 4/10 low back pain at worst for the past 7 days (05/06/2022); 4-5/10 at worst for the past 7 days, comes and goes (05/20/2022); 4/10 R low back at most for the past 7 days, 5/10 thoracic back pain at most for the past 7 days (07/02/2022); 08/27/22:  6-7/10 NPS. 4/10 low back pain at most for the past 7 days (original area of pain), 6/10 mid back pain at worst for the past 7 days, but fine when lying down (09/03/2022)    Time 8  Period Weeks    Status Partially Met    Target Date 10/22/22      PT LONG TERM GOAL #2   Title Pt will have a decrease in R LE pain to 3/10 or less at worst to promote ability to stand and ambulate will less difficulty.    Baseline 9/10 R LE pain at worst (01/12/2022); 3/10 R sciatic and L5 dermatome pain at most for the past 7 days; has R > L heel pain however which wakes her up at night. (02/16/2022); 4/10 R LE pain at most for the past 7 days. R heel pain however wakes her up at night. (03/12/2022), 3/10 R LE pain at worst for the past 7 days (04/02/2022); R heel bothered her  last night (possibly plantar fasciitis), 3/10 R LE pain at most for the past 7 days (05/06/2022);    Time 8    Period Weeks    Status Achieved    Target Date 05/07/22      PT LONG TERM GOAL #3   Title Pt will improve her FOTO score by at least 10 points as a demontsration of improved function.    Baseline Lumbar Spine FOTO 39 (01/12/2022); 46 (02/16/2022); 52 (03/12/2022); 49 (06/22/2022)    Time 8    Period Weeks    Status Achieved    Target Date 05/07/22      PT LONG TERM GOAL #4   Title Pt will be able to ambulate at least 100 ft independently to promote mobility and return to prior level of function.    Baseline Able to ambulate with rw short distance, about 40 ft with reproduction of pain. (01/12/2022); able to ambulate 120 ft with rw, SBA (02/16/2022); SPC 40 ft, CGA (03/12/2022); Pt able to ambulate 88 ft with SPC on L side (03/31/2022); 210 ft with SPC on L side, rw 200 ft, no pain (05/06/2022); SPC on L side 200 ft, then no AD but CGA 100 ft x 2 with rest break in between bouts, no back pain reported (05/20/2022); No AD, 100 ft, SBA (07/02/2022); no AD, 100' supervision, L antalgic gait.    Time 8    Period Weeks    Status Partially Met    Target Date 10/22/22      PT LONG TERM GOAL #5   Title Pt will improve 30 second STS test to > / = 12 reps to demonstrate clinically significant improvement in LE endurance and strength for transfers and gait for age matched norms.    Baseline 08/27/22: 10 reps. 8th decade of life norm is 12 reps; 12 reps in 30 seconds, no UE assist (09/03/2022)    Time 8    Period Weeks    Status Achieved    Target Date 10/22/22      PT LONG TERM GOAL #6   Title Pt will improve 2 minute walk test by the MCID of 40' to demonstrate improved capacity to complete household and short community distances at a decreased risk for falls.    Baseline 08/27/22: 263' no AD    Time 8    Period Weeks    Status Deferred    Target Date 10/22/22      PT LONG TERM GOAL #7   Title  Pt will improve self selected gait speed to 1 m/s to demonstrate decreased falls risk with short community distances.    Baseline 08/27/22: self selected .75 m/s. Fast paced, .87 m/s; self selected 0.77 m/s (09/03/2022)  Time 8    Period Weeks    Status On-going    Target Date 10/22/22              Plan - 09/10/22 1546     Clinical Impression Statement Decreased L hip and thigh pain (along L5 dermatome) after manual therapy to decrease lumbar paraspinal muscle tension to low back, resulting in decreased blood pressure and no dizziness afterwards. Worked on gentle trunk strengthening and thoracic extension in the reclined position to help decrease lumbar paraspinal muscle tension to low back. No pain or dizziness reported after session. Pt will benefit from continued skilled physical therapy services to improve ability to ambulate and perform standing tasks with less difficulty as well as to continue to decrease low back pain.    Personal Factors and Comorbidities Comorbidity 3+;Age;Time since onset of injury/illness/exacerbation;Fitness    Comorbidities Arthritis, atrial fibrillation, HTN, idiopathic pulmonary fibrosis    Examination-Activity Limitations Stairs;Bed Mobility;Stand;Sit;Lift;Transfers;Locomotion Level;Carry    Stability/Clinical Decision Making Stable/Uncomplicated    Rehab Potential Fair    PT Frequency 2x / week    PT Duration 8 weeks    PT Treatment/Interventions Therapeutic activities;Therapeutic exercise;Gait training;Balance training;Neuromuscular re-education;Patient/family education;Manual techniques;Dry needling;Electrical Stimulation;Iontophoresis '4mg'$ /ml Dexamethasone    PT Next Visit Plan Posture, thoracic extension, trunk and glute strength, lumbopelvic control during gait, manual techniques, modalities PRN    PT Home Exercise Plan Medbridge Access Code: [email or web address]    Consulted and Agree with Plan of Care Patient                                              Joneen Boers PT, DPT  09/10/2022, 4:38 PM

## 2022-09-14 ENCOUNTER — Ambulatory Visit: Payer: Medicare PPO

## 2022-09-17 ENCOUNTER — Ambulatory Visit: Payer: Medicare PPO | Attending: Neurosurgery

## 2022-09-17 DIAGNOSIS — R262 Difficulty in walking, not elsewhere classified: Secondary | ICD-10-CM | POA: Diagnosis present

## 2022-09-17 DIAGNOSIS — Z9181 History of falling: Secondary | ICD-10-CM | POA: Insufficient documentation

## 2022-09-17 DIAGNOSIS — M5417 Radiculopathy, lumbosacral region: Secondary | ICD-10-CM | POA: Insufficient documentation

## 2022-09-17 DIAGNOSIS — M6281 Muscle weakness (generalized): Secondary | ICD-10-CM | POA: Insufficient documentation

## 2022-09-17 DIAGNOSIS — M5431 Sciatica, right side: Secondary | ICD-10-CM | POA: Insufficient documentation

## 2022-09-17 DIAGNOSIS — M5459 Other low back pain: Secondary | ICD-10-CM | POA: Diagnosis present

## 2022-09-17 NOTE — Therapy (Signed)
OUTPATIENT PHYSICAL THERAPY TREATMENT NOTE     Patient Name: Joyce Evans MRN: [id number] DOB:01-24-1941, 82 y.o., female Today's Date: 09/17/2022  PCP: Derinda Late, MD  REFERRING PROVIDER: Leonie Green, NP   PT End of Session - 09/17/22 0933     Visit Number 52    Number of Visits 42    Date for PT Re-Evaluation 10/22/22    Authorization Type [employer]    Authorization Time Period 06/16/22-10/09/22    Authorization - Visit Number 16    Authorization - Number of Visits 34    Progress Note Due on Visit 68    PT Start Time 0934    PT Stop Time 1014    PT Time Calculation (min) 40 min    Equipment Utilized During Treatment Oxygen    Activity Tolerance Patient tolerated treatment well;No increased pain    Behavior During Therapy WFL for tasks assessed/performed                                                         Past Medical History:  Diagnosis Date   Actinic keratosis    Arthritis    osteo - shoulders, fingers   Atrial fibrillation (HCC)    Dizziness    thinks because of diuretic   Family history of adverse reaction to anesthesia    daughter -PONV   Gallstones 2016, 2017   GERD (gastroesophageal reflux disease)    Glaucoma    Heart murmur    history of   Hypertension    Melanoma ([place]) 04/20/2016   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 Excised: 05/19/2016, margins free.   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   Tricuspid valve disorder    leak   Past Surgical History:  Procedure Laterality Date   ABDOMINAL HYSTERECTOMY  1987   partial   CARDIAC CATHETERIZATION  2010   [facility]   CATARACT EXTRACTION W/PHACO Right 08/28/2015   Procedure: CATARACT EXTRACTION PHACO AND INTRAOCULAR LENS PLACEMENT ([place]);  Surgeon: Leandrew Koyanagi, MD;  Location: [facility];  Service: Ophthalmology;  Laterality: Right;  TORIC   INTRAMEDULLARY (IM) NAIL INTERTROCHANTERIC Right 10/16/2021   Procedure: INTRAMEDULLARY (IM) NAIL INTERTROCHANTRIC;  Surgeon: Renee Harder, MD;  Location: [facility];  Service: Orthopedics;  Laterality: Right;   MELANOMA EXCISION Left 05/19/2016   MIS. Left mid. lat. tricep. 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 S/P IM nail placement for right intertrochanteric hip fracture with Dr. Sharlet [place]ina on 10/16/2021. Pt slipped on the kithen floor. Pt was getting something from the microwave and pt tripped  on her O2 tube. Pt was independent with ambulation prior to her fall. Pt fell before in the woods and pt tripped over a root 3 years ago (no injuries per pt). Pt has been using supplemental O2 for the past 2 years in November. Pt has interstitial lung disease (idiopathic pulmonary fibrosis). Recent breathing test is stable. Back pain began when pt was in Rehab in [place] [facility]. Feels like it is sciatical. Pt radiates along the sciatic nerve and R L 5 dermatome. Pt is healing fine as far as her R hip surgery is concerned. Also has R medial knee pain due to arthritis. Had home health PT which involves seated knee extension, hip flexion, standing hip abduction, extension. Pt did some walking wiht a rw. Feels a lot of pain all over. Walking too far is limited due to her O2 situation.  PRECAUTIONS: SpO2 not under 90  %  SUBJECTIVE:Feels better today. Laying off the tizanadine because it makes her feel dizzy and nauseated. Has been up and down at night with low back pain. Can sleep up to 2:30 am. Rubbing her back feels better. Primarily bothers her when she lays down on her back or side. Mainly on L low back.  3/10 low back pain currently. Can start pulmonary rehab when done with PT. Sore from arthritis all over her body. Feels pretty achy all over today   PAIN:  Are you having pain? See subjective       TODAY'S TREATMENT:   09/17/2022    Therapeutic exercise  Palpation: B lumbar paraspinal muscle tension L > R   At start of session:   97%, HR  65 on 4 L O2  Seated trunk flexion stretch 15 seconds x 5  Seated trunk rotation R and L 10x5 seconds, then 5x5 seconds each direction  Reclined     Hooklying   Lower trunk rotation 10x5 seconds each direction     B scapular retraction 10x5 seconds pain free level      Then with posterior pelvic tilt 10x5 seconds     Posterior pelvic tilt with gentle PT manual isometrics lower trunk rotation resistance in neutral 8x5 seconds each side  Seated B scapular retraction 10x5 seconds  99%, HR 67   Gait with SPC L side 160 ft SBA  90%, HR 80 (increases to 97%, HR 64  after rest)    Therapeutic rest breaks utilized for oxygen and muscle recovery.    Improved exercise technique, movement at target joints, use of target muscles after min to mod verbal, visual, tactile cues.       Response to treatment Fair tolerance to today's session.      Clinical impression  Worked on thoracic extension, lumbar mobility and lumbopelvic strength to decrease stress to low back. Fair tolerance to today's session. Limited by discomfort at different areas of her body such as her neck, L shoulder, and her phalangeal joints.  Pt will benefit from continued skilled physical therapy services to improve ability to ambulate and perform standing tasks with less  difficulty as well as to continue to decrease low back pain.      PATIENT EDUCATION: Education details: ther-ex, HEP Person educated: Patient Education method: Explanation, Demonstration, Tactile cues, Verbal cues, and Handouts Education comprehension: verbalized understanding and returned demonstration   HOME EXERCISE PROGRAM: Access Code: [email or web address] URL: [email or web address] Date: 01/20/2022 Prepared by: Joneen Boers  Exercises - Seated Transversus Abdominis Bracing  - 1 x daily - 7 x weekly - 3 sets -  10 reps - 5 seconds hold - Seated Gluteal Sets  - 1 x daily - 7 x weekly - 3 sets - 10 reps - 5 seconds hold - Continued again 06/29/2022: Seated Scapular Retraction  - 1 x daily - 7 x weekly - 3 sets - 10 reps - 5 seconds  hold  Can perform aforementioned exercises in standing now.    - Seated Hip Adduction Isometrics with Ball  - 1 x daily - 7 x weekly - 2 sets - 10 reps - 5 seconds hold - Standing Hip Abduction with Counter Support  - 1 x daily - 7 x weekly - 3 sets - 10 reps - Discontinued on 03/09/2022: Seated Hip Abduction with Resistance  - 1 x daily - 7 x weekly - 2 sets - 10 reps  - Supine Posterior Pelvic Tilt  - 3 x daily - 7 x weekly - 3 sets - 10 reps - 5 seconds hold - Hooklying Clamshell with Resistance  - 1 x daily - 7 x weekly - 3 sets - 10 reps - Standing Posterior Pelvic Tilt  - 3 x daily - 7 x weekly - 3 sets - 10 reps - 5 seconds hold - Seated Passive Cervical Retraction  - 1 x daily - 7 x weekly - 3 sets - 10 reps - 5 seconds hold      PT Short Term Goals - 02/16/22 0941       PT SHORT TERM GOAL #1   Title Pt will be independent with her initial HEP to decrease pain, improve strength, function, and ability to stand and ambulate more comfortably.    Baseline No questions with HEP, has been doing them (02/16/2022)    Time 3    Period Weeks    Status Achieved    Target Date 02/05/22              PT Long Term Goals - 09/03/22 0943        PT LONG TERM GOAL #1   Title Pt will have a decrease in low back pain to 3/10 or less at worst to promote ability to stand and ambulate with less difficulty.    Baseline 6/10 low back pain at worst for the past 2 months (01/12/2022); 7/10 at most for the past 7 days (standing in the kitchen, more standing than normal becuase pt had guests over from [facility]) ( 02/16/2022);  6-7/10 back pain at worst for the past 7 days. Back pain at worst goes away faster since starting PT. (03/09/2022). R low back pain has not been bothering her. Thoracic back pain is about a 6/10 at worst for the past 7 days (04/02/2022); 4/10 low back pain at worst for the past 7 days (05/06/2022); 4-5/10 at worst for the past 7 days, comes and goes (05/20/2022); 4/10 R low back at most for the past 7 days, 5/10 thoracic back pain at most for the past 7 days (07/02/2022); 08/27/22:  6-7/10 NPS. 4/10 low back pain at most for the past 7 days (original area of pain), 6/10 mid back pain at worst for the past 7 days, but fine when lying down (09/03/2022)    Time 8    Period Weeks    Status Partially Met    Target Date 10/22/22      PT LONG TERM GOAL #2   Title Pt will have a decrease in R LE pain to 3/10 or less at worst to promote ability to stand  and ambulate will less difficulty.    Baseline 9/10 R LE pain at worst (01/12/2022); 3/10 R sciatic and L5 dermatome pain at most for the past 7 days; has R > L heel pain however which wakes her up at night. (02/16/2022); 4/10 R LE pain at most for the past 7 days. R heel pain however wakes her up at night. (03/12/2022), 3/10 R LE pain at worst for the past 7 days (04/02/2022); R heel bothered her last night (possibly plantar fasciitis), 3/10 R LE pain at most for the past 7 days (05/06/2022);    Time 8    Period Weeks    Status Achieved    Target Date 05/07/22      PT LONG TERM GOAL #3   Title Pt will improve her FOTO score by at least 10 points as a demontsration of improved function.     Baseline Lumbar Spine FOTO 39 (01/12/2022); 46 (02/16/2022); 52 (03/12/2022); 49 (06/22/2022)    Time 8    Period Weeks    Status Achieved    Target Date 05/07/22      PT LONG TERM GOAL #4   Title Pt will be able to ambulate at least 100 ft independently to promote mobility and return to prior level of function.    Baseline Able to ambulate with rw short distance, about 40 ft with reproduction of pain. (01/12/2022); able to ambulate 120 ft with rw, SBA (02/16/2022); SPC 40 ft, CGA (03/12/2022); Pt able to ambulate 88 ft with SPC on L side (03/31/2022); 210 ft with SPC on L side, rw 200 ft, no pain (05/06/2022); SPC on L side 200 ft, then no AD but CGA 100 ft x 2 with rest break in between bouts, no back pain reported (05/20/2022); No AD, 100 ft, SBA (07/02/2022); no AD, 100' supervision, L antalgic gait.    Time 8    Period Weeks    Status Partially Met    Target Date 10/22/22      PT LONG TERM GOAL #5   Title Pt will improve 30 second STS test to > / = 12 reps to demonstrate clinically significant improvement in LE endurance and strength for transfers and gait for age matched norms.    Baseline 08/27/22: 10 reps. 8th decade of life norm is 12 reps; 12 reps in 30 seconds, no UE assist (09/03/2022)    Time 8    Period Weeks    Status Achieved    Target Date 10/22/22      PT LONG TERM GOAL #6   Title Pt will improve 2 minute walk test by the MCID of 40' to demonstrate improved capacity to complete household and short community distances at a decreased risk for falls.    Baseline 08/27/22: 263' no AD    Time 8    Period Weeks    Status Deferred    Target Date 10/22/22      PT LONG TERM GOAL #7   Title Pt will improve self selected gait speed to 1 m/s to demonstrate decreased falls risk with short community distances.    Baseline 08/27/22: self selected .75 m/s. Fast paced, .87 m/s; self selected 0.77 m/s (09/03/2022)    Time 8    Period Weeks    Status On-going    Target Date 10/22/22               Plan - 09/17/22 0932     Clinical Impression Statement Worked on thoracic extension, lumbar  mobility and lumbopelvic strength to decrease stress to low back. Fair tolerance to today's session. Limited by discomfort at different areas of her body such as her neck, L shoulder, and her phalangeal joints.  Pt will benefit from continued skilled physical therapy services to improve ability to ambulate and perform standing tasks with less difficulty as well as to continue to decrease low back pain.    Personal Factors and Comorbidities Comorbidity 3+;Age;Time since onset of injury/illness/exacerbation;Fitness    Comorbidities Arthritis, atrial fibrillation, HTN, idiopathic pulmonary fibrosis    Examination-Activity Limitations Stairs;Bed Mobility;Stand;Sit;Lift;Transfers;Locomotion Level;Carry    Stability/Clinical Decision Making Stable/Uncomplicated    Rehab Potential Fair    PT Frequency 2x / week    PT Duration 8 weeks    PT Treatment/Interventions Therapeutic activities;Therapeutic exercise;Gait training;Balance training;Neuromuscular re-education;Patient/family education;Manual techniques;Dry needling;Electrical Stimulation;Iontophoresis '4mg'$ /ml Dexamethasone    PT Next Visit Plan Posture, thoracic extension, trunk and glute strength, lumbopelvic control during gait, manual techniques, modalities PRN    PT Home Exercise Plan Medbridge Access Code: [email or web address]    Consulted and Agree with Plan of Care Patient                                              Joneen Boers PT, DPT  09/17/2022, 10:26 AM

## 2022-09-22 ENCOUNTER — Ambulatory Visit: Payer: Medicare PPO

## 2022-09-24 ENCOUNTER — Ambulatory Visit: Payer: Medicare PPO

## 2022-09-28 ENCOUNTER — Ambulatory Visit: Payer: Medicare PPO

## 2022-09-30 ENCOUNTER — Ambulatory Visit: Payer: Medicare PPO

## 2022-09-30 ENCOUNTER — Telehealth: Payer: Self-pay

## 2022-09-30 NOTE — Telephone Encounter (Signed)
Pt called and said that her white blood cells are elevated based on her blood work from yesterday. Doctors think she might have some sort of infection. Still has diarrhea. Pt was recommended to hold off from today's PT session until her condition is taken care of. Pt verbalized understanding.

## 2022-10-05 ENCOUNTER — Ambulatory Visit: Payer: Medicare PPO

## 2022-10-05 DIAGNOSIS — M6281 Muscle weakness (generalized): Secondary | ICD-10-CM

## 2022-10-05 DIAGNOSIS — R262 Difficulty in walking, not elsewhere classified: Secondary | ICD-10-CM | POA: Diagnosis not present

## 2022-10-05 DIAGNOSIS — Z9181 History of falling: Secondary | ICD-10-CM

## 2022-10-05 NOTE — Therapy (Signed)
OUTPATIENT PHYSICAL THERAPY TREATMENT NOTE     Patient Name: Joyce Evans MRN: [id number] DOB:1941-02-14, 82 y.o., female Today's Date: 10/05/2022  PCP: Derinda Late, MD  REFERRING PROVIDER: Leonie Green, NP   PT End of Session - 10/05/22 1149     Visit Number 29    Number of Visits 15    Date for PT Re-Evaluation 10/22/22    Authorization Type [employer]    Authorization Time Period 06/16/22-10/09/22    Authorization - Visit Number 87    Authorization - Number of Visits 34    Progress Note Due on Visit 71    PT Start Time 1149    PT Stop Time 1229    PT Time Calculation (min) 40 min    Equipment Utilized During Treatment Oxygen    Activity Tolerance Patient tolerated treatment well;No increased pain    Behavior During Therapy WFL for tasks assessed/performed                                                          Past Medical History:  Diagnosis Date   Actinic keratosis    Arthritis    osteo - shoulders, fingers   Atrial fibrillation (HCC)    Dizziness    thinks because of diuretic   Family history of adverse reaction to anesthesia    daughter -PONV   Gallstones 2016, 2017   GERD (gastroesophageal reflux disease)    Glaucoma    Heart murmur    history of   Hypertension    Melanoma ([place]) 04/20/2016   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 Excised: 05/19/2016, margins free.   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   Tricuspid valve disorder    leak   Past Surgical History:  Procedure Laterality Date   ABDOMINAL HYSTERECTOMY  1987   partial   CARDIAC CATHETERIZATION  2010   [facility]   CATARACT EXTRACTION W/PHACO Right 08/28/2015   Procedure: CATARACT EXTRACTION PHACO AND INTRAOCULAR LENS PLACEMENT ([place]);  Surgeon: Leandrew Koyanagi, MD;  Location:  [facility];  Service: Ophthalmology;  Laterality: Right;  TORIC   INTRAMEDULLARY (IM) NAIL INTERTROCHANTERIC Right 10/16/2021   Procedure: INTRAMEDULLARY (IM) NAIL INTERTROCHANTRIC;  Surgeon: Renee Harder, MD;  Location: [facility];  Service: Orthopedics;  Laterality: Right;   MELANOMA EXCISION Left 05/19/2016   MIS. Left mid. lat. tricep. 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 S/P IM nail placement for right intertrochanteric hip fracture with Dr. Sharlet Salina on 10/16/2021. Pt slipped on the kithen floor. Pt was getting something from the microwave and pt tripped on her O2 tube. Pt was independent with ambulation prior  to her fall. Pt fell before in the woods and pt tripped over a root 3 years ago (no injuries per pt). Pt has been using supplemental O2 for the past 2 years in November. Pt has interstitial lung disease (idiopathic pulmonary fibrosis). Recent breathing test is stable. Back pain began when pt was in Rehab in [facility]. Feels like it is sciatical. Pt radiates along the sciatic nerve and R L 5 dermatome. Pt is healing fine as far as her R hip surgery is concerned. Also has R medial knee pain due to arthritis. Had home health PT which involves seated knee extension, hip flexion, standing hip abduction, extension. Pt did some walking wiht a rw. Feels a lot of pain all over. Walking too far is limited due to her O2 situation.  PRECAUTIONS: SpO2 not under 90 %  SUBJECTIVE: Thinks she had a virus. Went to a cardiologist, no gaps in heart beat,  heart rate goes up and down due to A-fib. Forgot to put her oxygen in one time one day for a while, was up and cooking, and her SpO2 was 94%. Does breathing exercises every day, spirometer has been running an average of 1250. Did 40 minutes on the recumbent bike, R hip bothered her, did stretches which helped. The back and heel are not bothering her. Dr. Baldemar Lenis increased her Celexa. Might also have depression due to her lifestyle change from her medical condition. Had diarrhea for 5 days. Went out to eat (get out from the house) and felt better. Feels like she is getting stronger. Has a red and yellow band at home. Back feels a lot better.    PAIN:  Are you having pain? See subjective       TODAY'S TREATMENT:   10/05/2022    Therapeutic exercise  Increased time secondary to listening to pt subjective reports.    At start of session:   99%, HR 71 on 4 L O2   Gait with SPC on L 220 ft, CGA  85%, HR 112 (increases to 97%, HR 74 after rest)  Stepping over 2 mini hurdles with SPC CGA 6x2  After first set 86%, HR 98 (increases to 98%, HR 72 after rest)   After second set 88 %, HR 92 (increases to 98%, HR 75 after rest)   Standing with B UE assist   Hip abduction R    with yellow band around knees 10x    With red band around knees 10x3     98%, HR 85  Standing bent over onto table   Hip extension    R 5x3   L 5x. Possible low back discomfort.      94%, HR 73 afterwards    96%, HR 69 at end of session.     Therapeutic rest breaks utilized for oxygen and muscle recovery.    Improved exercise technique, movement at target joints, use of target muscles after min to mod verbal, visual, tactile cues.       Response to treatment  Pt tolerated session well without aggravation of symptoms.       Clinical impression  Decreased overall back pain to 4/10 at worst for the past 7 days based on subjective reports. Per pt, back feels much better. Pt returns to PT  after a few weeks hiatus secondary to being sick. Continued working on gait with least restrictive AD, and obstacle negotiation to improve ability to ambulate and decrease fall risk. Also worked on [employer] and max  strengthening to decrease stress to low back during standing, closed chain tasks. Pt tolerated session well without aggravation of symptoms. Pt will benefit from continued skilled physical therapy services to improve ability to ambulate and perform standing tasks with less difficulty as well as to continue to decrease low back pain.      PATIENT EDUCATION: Education details: ther-ex, HEP Person educated: Patient Education method: Explanation, Demonstration, Tactile cues, Verbal cues, and Handouts Education comprehension: verbalized understanding and returned demonstration   HOME EXERCISE PROGRAM: Access Code: [email or web address] URL: [email or web address] Date: 01/20/2022 Prepared by: Joneen Boers  Exercises - Seated Transversus Abdominis Bracing  - 1 x daily - 7 x weekly - 3 sets - 10 reps - 5 seconds hold - Seated Gluteal Sets  - 1 x daily - 7 x weekly - 3 sets - 10 reps - 5 seconds hold - Continued again 06/29/2022: Seated Scapular Retraction  - 1 x daily - 7 x weekly - 3 sets - 10 reps - 5 seconds  hold  Can perform aforementioned exercises in standing now.    - Seated Hip Adduction Isometrics with Ball  - 1 x daily - 7 x weekly - 2 sets - 10 reps - 5 seconds hold - Standing Hip Abduction with Counter Support  - 1 x daily - 7 x weekly - 3 sets - 10 reps  Upgraded to red band on 10/05/2022  - Discontinued on 03/09/2022: Seated Hip Abduction with Resistance  - 1 x daily - 7 x weekly - 2 sets - 10 reps  - Supine Posterior Pelvic Tilt  - 3 x daily - 7 x weekly - 3 sets - 10 reps - 5 seconds hold - Hooklying Clamshell with Resistance  - 1 x daily - 7 x weekly - 3 sets - 10 reps - Standing Posterior Pelvic Tilt  - 3 x daily - 7 x weekly - 3 sets - 10 reps - 5 seconds  hold - Seated Passive Cervical Retraction  - 1 x daily - 7 x weekly - 3 sets - 10 reps - 5 seconds hold      PT Short Term Goals - 02/16/22 0941       PT SHORT TERM GOAL #1   Title Pt will be independent with her initial HEP to decrease pain, improve strength, function, and ability to stand and ambulate more comfortably.    Baseline No questions with HEP, has been doing them (02/16/2022)    Time 3    Period Weeks    Status Achieved    Target Date 02/05/22              PT Long Term Goals - 10/05/22 1159       PT LONG TERM GOAL #1   Title Pt will have a decrease in low back pain to 3/10 or less at worst to promote ability to stand and ambulate with less difficulty.    Baseline 6/10 low back pain at worst for the past 2 months (01/12/2022); 7/10 at most for the past 7 days (standing in the kitchen, more standing than normal becuase pt had guests over from [facility]) ( 02/16/2022);  6-7/10 back pain at worst for the past 7 days. Back pain at worst goes away faster since starting PT. (03/09/2022). R low back pain has not been bothering her. Thoracic back pain is about a 6/10 at worst for the past 7 days (04/02/2022); 4/10 low back pain at worst for the past 7 days (05/06/2022);  4-5/10 at worst for the past 7 days, comes and goes (05/20/2022); 4/10 R low back at most for the past 7 days, 5/10 thoracic back pain at most for the past 7 days (07/02/2022); 08/27/22:  6-7/10 NPS. 4/10 low back pain at most for the past 7 days (original area of pain), 6/10 mid back pain at worst for the past 7 days, but fine when lying down (09/03/2022); 4/10 back pain at worst for the past 7 days. (10/05/2022)    Time 8    Period Weeks    Status Partially Met    Target Date 10/22/22      PT LONG TERM GOAL #2   Title Pt will have a decrease in R LE pain to 3/10 or less at worst to promote ability to stand and ambulate will less difficulty.    Baseline 9/10 R LE pain at worst (01/12/2022); 3/10 R sciatic and L5  dermatome pain at most for the past 7 days; has R > L heel pain however which wakes her up at night. (02/16/2022); 4/10 R LE pain at most for the past 7 days. R heel pain however wakes her up at night. (03/12/2022), 3/10 R LE pain at worst for the past 7 days (04/02/2022); R heel bothered her last night (possibly plantar fasciitis), 3/10 R LE pain at most for the past 7 days (05/06/2022);    Time 8    Period Weeks    Status Achieved    Target Date 05/07/22      PT LONG TERM GOAL #3   Title Pt will improve her FOTO score by at least 10 points as a demontsration of improved function.    Baseline Lumbar Spine FOTO 39 (01/12/2022); 46 (02/16/2022); 52 (03/12/2022); 49 (06/22/2022)    Time 8    Period Weeks    Status Achieved    Target Date 05/07/22      PT LONG TERM GOAL #4   Title Pt will be able to ambulate at least 100 ft independently to promote mobility and return to prior level of function.    Baseline Able to ambulate with rw short distance, about 40 ft with reproduction of pain. (01/12/2022); able to ambulate 120 ft with rw, SBA (02/16/2022); SPC 40 ft, CGA (03/12/2022); Pt able to ambulate 88 ft with SPC on L side (03/31/2022); 210 ft with SPC on L side, rw 200 ft, no pain (05/06/2022); SPC on L side 200 ft, then no AD but CGA 100 ft x 2 with rest break in between bouts, no back pain reported (05/20/2022); No AD, 100 ft, SBA (07/02/2022); no AD, 100' supervision, L antalgic gait.    Time 8    Period Weeks    Status Partially Met    Target Date 10/22/22      PT LONG TERM GOAL #5   Title Pt will improve 30 second STS test to > / = 12 reps to demonstrate clinically significant improvement in LE endurance and strength for transfers and gait for age matched norms.    Baseline 08/27/22: 10 reps. 8th decade of life norm is 12 reps; 12 reps in 30 seconds, no UE assist (09/03/2022)    Time 8    Period Weeks    Status Achieved    Target Date 10/22/22      PT LONG TERM GOAL #6   Title Pt will improve 2 minute  walk test by the MCID of 40' to demonstrate improved capacity to complete household and short  community distances at a decreased risk for falls.    Baseline 08/27/22: 263' no AD    Time 8    Period Weeks    Status Deferred    Target Date 10/22/22      PT LONG TERM GOAL #7   Title Pt will improve self selected gait speed to 1 m/s to demonstrate decreased falls risk with short community distances.    Baseline 08/27/22: self selected .75 m/s. Fast paced, .87 m/s; self selected 0.77 m/s (09/03/2022)    Time 8    Period Weeks    Status On-going    Target Date 10/22/22              Plan - 10/05/22 1148     Clinical Impression Statement Decreased overall back pain to 4/10 at worst for the past 7 days based on subjective reports. Per pt, back feels much better. Pt returns to PT after a few weeks hiatus secondary to being sick. Continued working on gait with least restrictive AD, and obstacle negotiation to improve ability to ambulate and decrease fall risk. Also worked on [employer] and max strengthening to decrease stress to low back during standing, closed chain tasks. Pt tolerated session well without aggravation of symptoms. Pt will benefit from continued skilled physical therapy services to improve ability to ambulate and perform standing tasks with less difficulty as well as to continue to decrease low back pain.    Personal Factors and Comorbidities Comorbidity 3+;Age;Time since onset of injury/illness/exacerbation;Fitness    Comorbidities Arthritis, atrial fibrillation, HTN, idiopathic pulmonary fibrosis    Examination-Activity Limitations Stairs;Bed Mobility;Stand;Sit;Lift;Transfers;Locomotion Level;Carry    Stability/Clinical Decision Making Stable/Uncomplicated    Clinical Decision Making Low    Rehab Potential Fair    PT Frequency 2x / week    PT Duration 8 weeks    PT Treatment/Interventions Therapeutic activities;Therapeutic exercise;Gait training;Balance training;Neuromuscular  re-education;Patient/family education;Manual techniques;Dry needling;Electrical Stimulation;Iontophoresis 4mg /ml Dexamethasone    PT Next Visit Plan Posture, thoracic extension, trunk and glute strength, lumbopelvic control during gait, manual techniques, modalities PRN    PT Home Exercise Plan Medbridge Access Code: [email or web address]    Consulted and Agree with Plan of Care Patient                 Joneen Boers PT, DPT  10/05/2022, 12:44 PM

## 2022-10-07 ENCOUNTER — Ambulatory Visit: Payer: Medicare PPO

## 2022-10-07 DIAGNOSIS — M6281 Muscle weakness (generalized): Secondary | ICD-10-CM

## 2022-10-07 DIAGNOSIS — Z9181 History of falling: Secondary | ICD-10-CM

## 2022-10-07 DIAGNOSIS — R262 Difficulty in walking, not elsewhere classified: Secondary | ICD-10-CM

## 2022-10-07 NOTE — Therapy (Signed)
OUTPATIENT PHYSICAL THERAPY TREATMENT NOTE     Patient Name: Joyce Evans MRN: [id number] DOB:January 31, 1941, 82 y.o., female Today's Date: 10/07/2022  PCP: Derinda Late, MD  REFERRING PROVIDER: Leonie Green, NP   PT End of Session - 10/07/22 1150     Visit Number 68    Number of Visits 40    Date for PT Re-Evaluation 10/22/22    Authorization Type [employer]    Authorization Time Period 06/16/22-10/09/22    Authorization - Number of Visits 34    Progress Note Due on Visit 40    PT Start Time 1150    PT Stop Time 1232    PT Time Calculation (min) 42 min    Equipment Utilized During Treatment Oxygen    Activity Tolerance Patient tolerated treatment well;No increased pain    Behavior During Therapy WFL for tasks assessed/performed                                                           Past Medical History:  Diagnosis Date   Actinic keratosis    Arthritis    osteo - shoulders, fingers   Atrial fibrillation (HCC)    Dizziness    thinks because of diuretic   Family history of adverse reaction to anesthesia    daughter -PONV   Gallstones 2016, 2017   GERD (gastroesophageal reflux disease)    Glaucoma    Heart murmur    history of   Hypertension    Melanoma ([place]) 04/20/2016   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 Excised: 05/19/2016, margins free.   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   Tricuspid valve disorder    leak   Past Surgical History:  Procedure Laterality Date   ABDOMINAL HYSTERECTOMY  1987   partial   CARDIAC CATHETERIZATION  2010   [facility]   CATARACT EXTRACTION W/PHACO Right 08/28/2015   Procedure: CATARACT EXTRACTION PHACO AND INTRAOCULAR LENS PLACEMENT ([place]);  Surgeon: Leandrew Koyanagi, MD;  Location: [place];  Service:  Ophthalmology;  Laterality: Right;  TORIC   INTRAMEDULLARY (IM) NAIL INTERTROCHANTERIC Right 10/16/2021   Procedure: INTRAMEDULLARY (IM) NAIL INTERTROCHANTRIC;  Surgeon: Renee Harder, MD;  Location: [facility];  Service: Orthopedics;  Laterality: Right;   MELANOMA EXCISION Left 05/19/2016   MIS. Left mid. lat. tricep. 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 S/P IM nail placement for right intertrochanteric hip fracture with Dr. Sharlet Salina on 10/16/2021. Pt slipped on the kithen floor. Pt was getting something from the microwave and pt tripped on her O2 tube. Pt was independent with ambulation prior to her fall. Pt fell before in  [place] woods and pt tripped over a root 3 years ago (no injuries per pt). Pt has been using supplemental O2 for the past 2 years in November. Pt has interstitial lung disease (idiopathic pulmonary fibrosis). Recent breathing test is stable. Back pain began when pt was in Rehab in [place] [facility]. Feels like it is sciatical. Pt radiates along the sciatic nerve and R L 5 dermatome. Pt is healing fine as far as her R hip surgery is concerned. Also has R medial knee pain due to arthritis. Had home health PT which involves seated knee extension, hip flexion, standing hip abduction, extension. Pt did some walking wiht a rw. Feels a lot of pain all over. Walking too far is limited due to her O2 situation.  PRECAUTIONS: SpO2 not under 90 %  SUBJECTIVE: Back is good. R knee just bothers her but other than that things are good. 4/10 R knee pain during gait.     PAIN:  Are you having pain? See subjective       TODAY'S TREATMENT:   10/07/2022    Therapeutic exercise At start of session:   97%, HR 76 on 4 L O2  TTP R medial joint line  Seated R knee flexion targeting medial hamstrings, green band 10x3  Decreased R medial knee pain afterwards  Seated hip hip ER green band 5x. Difficult. (98%, HR 62)  Then red band 10x2  Seated hip adduction small physioball squeeze isometrics 10x10 seconds   Standing hip abduction with B UE assist, red band around ankles  R 10x3   93% - 97%   Standing with B UE assist   Red band around knees   Standing forward weight shift onto Air Ex pad, staggered stance     R 10x5 seconds for 2 sets   (95%, HR 70 after 2 sets; feels a little dizzy afterwards    Blood pressure L arm sitting, mechanically taken, normal cuff: 98/46, HR 55     To promote glute med, max,strength, femoral control, and stability at her R knee.     Seated transversus abdominis contraction with glute max squeeze 10x5 seconds for 3 sets  Blood pressure L arm sitting, mechanically taken, normal cuff: 95/50, HR 60       Therapeutic rest breaks utilized for oxygen and muscle recovery.    Improved exercise technique, movement at target joints, use of target muscles after min to mod verbal, visual, tactile cues.       Response to treatment  Pt tolerated session well without aggravation of symptoms. No R knee pain reported during gait after session.       Clinical impression  Worked on improving R medial hamstrings, glute med, max muscle strengthening to improve R knee mechanics during gait. No R knee pain reported during gait after session. Worked on decreasing R knee pain to promote ability to perform standing tasks for her back as well as ambulate, and improve balance more comfortably. Pt will benefit from continued skilled physical therapy services to improve ability to ambulate and perform standing tasks with  less difficulty as well as to continue to decrease low back pain.      PATIENT EDUCATION: Education details: ther-ex, HEP Person educated: Patient Education method: Explanation, Demonstration, Tactile cues, Verbal cues, and Handouts Education comprehension: verbalized understanding and returned demonstration   HOME EXERCISE PROGRAM: Access Code: [email or web address] URL: [email or web address] Date: 01/20/2022 Prepared by: Joneen Boers  Exercises - Seated Transversus Abdominis Bracing  - 1 x daily - 7  x weekly - 3 sets - 10 reps - 5 seconds hold - Seated Gluteal Sets  - 1 x daily - 7 x weekly - 3 sets - 10 reps - 5 seconds hold - Continued again 06/29/2022: Seated Scapular Retraction  - 1 x daily - 7 x weekly - 3 sets - 10 reps - 5 seconds  hold  Can perform aforementioned exercises in standing now.    - Seated Hip Adduction Isometrics with Ball  - 1 x daily - 7 x weekly - 2 sets - 10 reps - 5 seconds hold - Standing Hip Abduction with Counter Support  - 1 x daily - 7 x weekly - 3 sets - 10 reps  Upgraded to red band on 10/05/2022  - Discontinued on 03/09/2022: Seated Hip Abduction with Resistance  - 1 x daily - 7 x weekly - 2 sets - 10 reps  - Supine Posterior Pelvic Tilt  - 3 x daily - 7 x weekly - 3 sets - 10 reps - 5 seconds hold - Hooklying Clamshell with Resistance  - 1 x daily - 7 x weekly - 3 sets - 10 reps - Standing Posterior Pelvic Tilt  - 3 x daily - 7 x weekly - 3 sets - 10 reps - 5 seconds hold - Seated Passive Cervical Retraction  - 1 x daily - 7 x weekly - 3 sets - 10 reps - 5 seconds hold      PT Short Term Goals - 02/16/22 0941       PT SHORT TERM GOAL #1   Title Pt will be independent with her initial HEP to decrease pain, improve strength, function, and ability to stand and ambulate more comfortably.    Baseline No questions with HEP, has been doing them (02/16/2022)    Time 3    Period Weeks    Status Achieved    Target Date 02/05/22               PT Long Term Goals - 10/05/22 1159       PT LONG TERM GOAL #1   Title Pt will have a decrease in low back pain to 3/10 or less at worst to promote ability to stand and ambulate with less difficulty.    Baseline 6/10 low back pain at worst for the past 2 months (01/12/2022); 7/10 at most for the past 7 days (standing in the kitchen, more standing than normal becuase pt had guests over from [facility]) ( 02/16/2022);  6-7/10 back pain at worst for the past 7 days. Back pain at worst goes away faster since starting PT. (03/09/2022). R low back pain has not been bothering her. Thoracic back pain is about a 6/10 at worst for the past 7 days (04/02/2022); 4/10 low back pain at worst for the past 7 days (05/06/2022); 4-5/10 at worst for the past 7 days, comes and goes (05/20/2022); 4/10 R low back at most for the past 7 days, 5/10 thoracic back pain at most for the past 7 days (07/02/2022); 08/27/22:  6-7/10 NPS. 4/10 low back pain at most for the past 7 days (original area of pain), 6/10 mid back pain at worst for the past 7 days, but fine when lying down (09/03/2022); 4/10 back pain at worst for the past 7 days. (10/05/2022)    Time 8    Period Weeks    Status Partially Met    Target Date 10/22/22      PT LONG TERM  GOAL #2   Title Pt will have a decrease in R LE pain to 3/10 or less at worst to promote ability to stand and ambulate will less difficulty.    Baseline 9/10 R LE pain at worst (01/12/2022); 3/10 R sciatic and L5 dermatome pain at most for the past 7 days; has R > L heel pain however which wakes her up at night. (02/16/2022); 4/10 R LE pain at most for the past 7 days. R heel pain however wakes her up at night. (03/12/2022), 3/10 R LE pain at worst for the past 7 days (04/02/2022); R heel bothered her last night (possibly plantar fasciitis), 3/10 R LE pain at most for the past 7 days (05/06/2022);    Time 8    Period Weeks    Status Achieved    Target Date 05/07/22      PT LONG TERM GOAL #3    Title Pt will improve her FOTO score by at least 10 points as a demontsration of improved function.    Baseline Lumbar Spine FOTO 39 (01/12/2022); 46 (02/16/2022); 52 (03/12/2022); 49 (06/22/2022)    Time 8    Period Weeks    Status Achieved    Target Date 05/07/22      PT LONG TERM GOAL #4   Title Pt will be able to ambulate at least 100 ft independently to promote mobility and return to prior level of function.    Baseline Able to ambulate with rw short distance, about 40 ft with reproduction of pain. (01/12/2022); able to ambulate 120 ft with rw, SBA (02/16/2022); SPC 40 ft, CGA (03/12/2022); Pt able to ambulate 88 ft with SPC on L side (03/31/2022); 210 ft with SPC on L side, rw 200 ft, no pain (05/06/2022); SPC on L side 200 ft, then no AD but CGA 100 ft x 2 with rest break in between bouts, no back pain reported (05/20/2022); No AD, 100 ft, SBA (07/02/2022); no AD, 100' supervision, L antalgic gait.    Time 8    Period Weeks    Status Partially Met    Target Date 10/22/22      PT LONG TERM GOAL #5   Title Pt will improve 30 second STS test to > / = 12 reps to demonstrate clinically significant improvement in LE endurance and strength for transfers and gait for age matched norms.    Baseline 08/27/22: 10 reps. 8th decade of life norm is 12 reps; 12 reps in 30 seconds, no UE assist (09/03/2022)    Time 8    Period Weeks    Status Achieved    Target Date 10/22/22      PT LONG TERM GOAL #6   Title Pt will improve 2 minute walk test by the MCID of 40' to demonstrate improved capacity to complete household and short community distances at a decreased risk for falls.    Baseline 08/27/22: 263' no AD    Time 8    Period Weeks    Status Deferred    Target Date 10/22/22      PT LONG TERM GOAL #7   Title Pt will improve self selected gait speed to 1 m/s to demonstrate decreased falls risk with short community distances.    Baseline 08/27/22: self selected .75 m/s. Fast paced, .87 m/s; self selected 0.77  m/s (09/03/2022)    Time 8    Period Weeks    Status On-going    Target Date 10/22/22  Plan - 10/07/22 1150     Clinical Impression Statement Worked on improving R medial hamstrings, glute med, max muscle strengthening to improve R knee mechanics during gait. No R knee pain reported during gait after session. Worked on decreasing R knee pain to promote ability to perform standing tasks for her back as well as ambulate, and improve balance more comfortably. Pt will benefit from continued skilled physical therapy services to improve ability to ambulate and perform standing tasks with less difficulty as well as to continue to decrease low back pain.    Personal Factors and Comorbidities Comorbidity 3+;Age;Time since onset of injury/illness/exacerbation;Fitness    Comorbidities Arthritis, atrial fibrillation, HTN, idiopathic pulmonary fibrosis    Examination-Activity Limitations Stairs;Bed Mobility;Stand;Sit;Lift;Transfers;Locomotion Level;Carry    Stability/Clinical Decision Making Stable/Uncomplicated    Rehab Potential Fair    PT Frequency 2x / week    PT Duration 8 weeks    PT Treatment/Interventions Therapeutic activities;Therapeutic exercise;Gait training;Balance training;Neuromuscular re-education;Patient/family education;Manual techniques;Dry needling;Electrical Stimulation;Iontophoresis 4mg /ml Dexamethasone    PT Next Visit Plan Posture, thoracic extension, trunk and glute strength, lumbopelvic control during gait, manual techniques, modalities PRN    PT Home Exercise Plan Medbridge Access Code: [email or web address]    Consulted and Agree with Plan of Care Patient                 Joneen Boers PT, DPT  10/07/2022, 1:17 PM

## 2022-10-12 ENCOUNTER — Ambulatory Visit: Payer: Medicare PPO

## 2022-10-14 ENCOUNTER — Ambulatory Visit: Payer: Medicare PPO | Attending: Neurosurgery

## 2022-10-14 DIAGNOSIS — M5459 Other low back pain: Secondary | ICD-10-CM | POA: Insufficient documentation

## 2022-10-14 DIAGNOSIS — M6281 Muscle weakness (generalized): Secondary | ICD-10-CM | POA: Diagnosis present

## 2022-10-14 DIAGNOSIS — R262 Difficulty in walking, not elsewhere classified: Secondary | ICD-10-CM

## 2022-10-14 DIAGNOSIS — M5417 Radiculopathy, lumbosacral region: Secondary | ICD-10-CM | POA: Insufficient documentation

## 2022-10-14 DIAGNOSIS — M5431 Sciatica, right side: Secondary | ICD-10-CM | POA: Insufficient documentation

## 2022-10-14 DIAGNOSIS — Z9181 History of falling: Secondary | ICD-10-CM | POA: Insufficient documentation

## 2022-10-14 NOTE — Therapy (Signed)
OUTPATIENT PHYSICAL THERAPY TREATMENT NOTE     Patient Name: Joyce Evans MRN: [id number] DOB:June 10, 1941, 82 y.o., female Today's Date: 10/14/2022  PCP: Derinda Late, MD  REFERRING PROVIDER: Leonie Green, NP   PT End of Session - 10/14/22 1147     Visit Number 87    Number of Visits 20    Date for PT Re-Evaluation 10/22/22    Authorization Type [employer]    Authorization Time Period 06/16/22-10/09/22    Authorization - Visit Number 56    Authorization - Number of Visits 34    Progress Note Due on Visit 68    PT Start Time 1148    PT Stop Time 1232    PT Time Calculation (min) 44 min    Equipment Utilized During Treatment Oxygen    Activity Tolerance Patient tolerated treatment well;No increased pain    Behavior During Therapy WFL for tasks assessed/performed                                                            Past Medical History:  Diagnosis Date   Actinic keratosis    Arthritis    osteo - shoulders, fingers   Atrial fibrillation (HCC)    Dizziness    thinks because of diuretic   Family history of adverse reaction to anesthesia    daughter -PONV   Gallstones 2016, 2017   GERD (gastroesophageal reflux disease)    Glaucoma    Heart murmur    history of   Hypertension    Melanoma ([place]) 04/20/2016   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 Excised: 05/19/2016, margins free.   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   Tricuspid valve disorder    leak   Past Surgical History:  Procedure Laterality Date   ABDOMINAL HYSTERECTOMY  1987   partial   CARDIAC CATHETERIZATION  2010   [facility]   CATARACT EXTRACTION W/PHACO Right 08/28/2015   Procedure: CATARACT EXTRACTION PHACO AND INTRAOCULAR LENS PLACEMENT ([place]);  Surgeon: Leandrew Koyanagi, MD;   Location: [place];  Service: Ophthalmology;  Laterality: Right;  TORIC   INTRAMEDULLARY (IM) NAIL INTERTROCHANTERIC Right 10/16/2021   Procedure: INTRAMEDULLARY (IM) NAIL INTERTROCHANTRIC;  Surgeon: Renee Harder, MD;  Location: [facility];  Service: Orthopedics;  Laterality: Right;   MELANOMA EXCISION Left 05/19/2016   MIS. Left mid. lat. tricep. 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 S/P IM nail placement for right intertrochanteric hip fracture with Dr. Sharlet Salina on 10/16/2021. Pt slipped on the kithen floor. Pt was getting something from the microwave and pt tripped on her O2 tube. Pt was independent with ambulation  prior to her fall. Pt fell before in the woods and pt tripped over a root 3 years ago (no injuries per pt). Pt has been using supplemental O2 for the past 2 years in November. Pt has interstitial lung disease (idiopathic pulmonary fibrosis). Recent breathing test is stable. Back pain began when pt was in Rehab in [place] [facility]. Feels like it is sciatical. Pt radiates along the sciatic nerve and R L 5 dermatome. Pt is healing fine as far as her R hip surgery is concerned. Also has R medial knee pain due to arthritis. Had home health PT which involves seated knee extension, hip flexion, standing hip abduction, extension. Pt did some walking wiht a rw. Feels a lot of pain all over. Walking too far is limited due to her O2 situation.  PRECAUTIONS: SpO2 not under 90 %  SUBJECTIVE: R  knee has been bothering her. Going up and down steps bothers her R  knee. 5/10 R knee pain currently. Felt short of breath after getting out of the car. Standing up and sitting or moving around helps decrease R knee pain. Had  a very active weekend. Back has been doing fine.    PAIN:  Are you having pain? See subjective       TODAY'S TREATMENT:   10/14/2022    TTP R medial joint line   Therapeutic exercise At start of session:   89% at 4 L at start of session, time taken to allow for increased oxygen saturation.   Prior to exercises:  97%, HR 58 on 4 L O2  Seated R knee flexion targeting medial hamstrings, green band 10x3  Seated hip hip ER red band 10x3  Seated hip adduction small physioball squeeze isometrics 10x10 seconds   93% SpO2 afterwards.(Improved to 98%, HR 60)   Standing hip abduction with B UE assist, red band around ankles  R 10x3   93% - 97%  Rest break secondary to fatigue   Reviewed POC: discharge planning for next week then transition to HEP and pulmonary rehab.   Seated B scapular retraction 10x5 seconds for 3 sets        Therapeutic rest breaks utilized for oxygen and muscle recovery.    Improved exercise technique, movement at target joints, use of target muscles after min to mod verbal, visual, tactile cues.       Response to treatment  Pt tolerated session well without aggravation of symptoms.      Clinical impression  Continued working on improving R medial hamstrings, glute med, max muscle strengthening to improve R knee mechanics during gait. Pt demonstrates overall lower starting SpO2 levels today, therefore gait with and without SPC not performed today so as to not overstress pt's body systems. Pt tolerated session well without aggravation of symptoms. Pt will benefit from continued skilled physical therapy services to improve ability to ambulate and perform standing tasks with less difficulty as well as to continue to decrease low back pain.      PATIENT EDUCATION: Education details:  ther-ex, HEP Person educated: Patient Education method: Explanation, Demonstration, Tactile cues, Verbal cues, and Handouts Education comprehension: verbalized understanding and returned demonstration   HOME EXERCISE PROGRAM: Access Code: [email or web address] URL: [email or web address] Date: 01/20/2022 Prepared by: Joneen Boers  Exercises - Seated Transversus Abdominis Bracing  - 1 x daily - 7 x weekly - 3 sets - 10 reps - 5 seconds hold - Seated Gluteal Sets  - 1 x daily - 7 x weekly - 3 sets -  10 reps - 5 seconds hold - Continued again 06/29/2022: Seated Scapular Retraction  - 1 x daily - 7 x weekly - 3 sets - 10 reps - 5 seconds  hold  Can perform aforementioned exercises in standing now.    - Seated Hip Adduction Isometrics with Ball  - 1 x daily - 7 x weekly - 2 sets - 10 reps - 5 seconds hold - Standing Hip Abduction with Counter Support  - 1 x daily - 7 x weekly - 3 sets - 10 reps  Upgraded to red band on 10/05/2022  - Discontinued on 03/09/2022: Seated Hip Abduction with Resistance  - 1 x daily - 7 x weekly - 2 sets - 10 reps  - Supine Posterior Pelvic Tilt  - 3 x daily - 7 x weekly - 3 sets - 10 reps - 5 seconds hold - Hooklying Clamshell with Resistance  - 1 x daily - 7 x weekly - 3 sets - 10 reps - Standing Posterior Pelvic Tilt  - 3 x daily - 7 x weekly - 3 sets - 10 reps - 5 seconds hold - Seated Passive Cervical Retraction  - 1 x daily - 7 x weekly - 3 sets - 10 reps - 5 seconds hold  Seated R knee flexion targeting medial hamstrings, green band 10x3  - Seated Hip External Rotation with Resistance  - 1 x daily - 7 x weekly - 3 sets - 10 reps  Red band        PT Short Term Goals - 02/16/22 0941       PT SHORT TERM GOAL #1   Title Pt will be independent with her initial HEP to decrease pain, improve strength, function, and ability to stand and ambulate more comfortably.    Baseline No questions with HEP, has been doing them (02/16/2022)    Time 3    Period  Weeks    Status Achieved    Target Date 02/05/22              PT Long Term Goals - 10/05/22 1159       PT LONG TERM GOAL #1   Title Pt will have a decrease in low back pain to 3/10 or less at worst to promote ability to stand and ambulate with less difficulty.    Baseline 6/10 low back pain at worst for the past 2 months (01/12/2022); 7/10 at most for the past 7 days (standing in the kitchen, more standing than normal becuase pt had guests over from [facility]) ( 02/16/2022);  6-7/10 back pain at worst for the past 7 days. Back pain at worst goes away faster since starting PT. (03/09/2022). R low back pain has not been bothering her. Thoracic back pain is about a 6/10 at worst for the past 7 days (04/02/2022); 4/10 low back pain at worst for the past 7 days (05/06/2022); 4-5/10 at worst for the past 7 days, comes and goes (05/20/2022); 4/10 R low back at most for the past 7 days, 5/10 thoracic back pain at most for the past 7 days (07/02/2022); 08/27/22:  6-7/10 NPS. 4/10 low back pain at most for the past 7 days (original area of pain), 6/10 mid back pain at worst for the past 7 days, but fine when lying down (09/03/2022); 4/10 back pain at worst for the past 7 days. (10/05/2022)    Time 8    Period Weeks    Status Partially Met    Target  Date 10/22/22      PT LONG TERM GOAL #2   Title Pt will have a decrease in R LE pain to 3/10 or less at worst to promote ability to stand and ambulate will less difficulty.    Baseline 9/10 R LE pain at worst (01/12/2022); 3/10 R sciatic and L5 dermatome pain at most for the past 7 days; has R > L heel pain however which wakes her up at night. (02/16/2022); 4/10 R LE pain at most for the past 7 days. R heel pain however wakes her up at night. (03/12/2022), 3/10 R LE pain at worst for the past 7 days (04/02/2022); R heel bothered her last night (possibly plantar fasciitis), 3/10 R LE pain at most for the past 7 days (05/06/2022);    Time 8    Period Weeks    Status  Achieved    Target Date 05/07/22      PT LONG TERM GOAL #3   Title Pt will improve her FOTO score by at least 10 points as a demontsration of improved function.    Baseline Lumbar Spine FOTO 39 (01/12/2022); 46 (02/16/2022); 52 (03/12/2022); 49 (06/22/2022)    Time 8    Period Weeks    Status Achieved    Target Date 05/07/22      PT LONG TERM GOAL #4   Title Pt will be able to ambulate at least 100 ft independently to promote mobility and return to prior level of function.    Baseline Able to ambulate with rw short distance, about 40 ft with reproduction of pain. (01/12/2022); able to ambulate 120 ft with rw, SBA (02/16/2022); SPC 40 ft, CGA (03/12/2022); Pt able to ambulate 88 ft with SPC on L side (03/31/2022); 210 ft with SPC on L side, rw 200 ft, no pain (05/06/2022); SPC on L side 200 ft, then no AD but CGA 100 ft x 2 with rest break in between bouts, no back pain reported (05/20/2022); No AD, 100 ft, SBA (07/02/2022); no AD, 100' supervision, L antalgic gait.    Time 8    Period Weeks    Status Partially Met    Target Date 10/22/22      PT LONG TERM GOAL #5   Title Pt will improve 30 second STS test to > / = 12 reps to demonstrate clinically significant improvement in LE endurance and strength for transfers and gait for age matched norms.    Baseline 08/27/22: 10 reps. 8th decade of life norm is 12 reps; 12 reps in 30 seconds, no UE assist (09/03/2022)    Time 8    Period Weeks    Status Achieved    Target Date 10/22/22      PT LONG TERM GOAL #6   Title Pt will improve 2 minute walk test by the MCID of 40' to demonstrate improved capacity to complete household and short community distances at a decreased risk for falls.    Baseline 08/27/22: 263' no AD    Time 8    Period Weeks    Status Deferred    Target Date 10/22/22      PT LONG TERM GOAL #7   Title Pt will improve self selected gait speed to 1 m/s to demonstrate decreased falls risk with short community distances.    Baseline  08/27/22: self selected .75 m/s. Fast paced, .87 m/s; self selected 0.77 m/s (09/03/2022)    Time 8    Period Weeks    Status On-going  Target Date 10/22/22              Plan - 10/14/22 1146     Clinical Impression Statement Continued working on improving R medial hamstrings, glute med, max muscle strengthening to improve R knee mechanics during gait. Pt demonstrates overall lower starting SpO2 levels today, therefore gait with and without SPC not performed today so as to not overstress pt's body systems. Pt tolerated session well without aggravation of symptoms. Pt will benefit from continued skilled physical therapy services to improve ability to ambulate and perform standing tasks with less difficulty as well as to continue to decrease low back pain.    Personal Factors and Comorbidities Comorbidity 3+;Age;Time since onset of injury/illness/exacerbation;Fitness    Comorbidities Arthritis, atrial fibrillation, HTN, idiopathic pulmonary fibrosis    Examination-Activity Limitations Stairs;Bed Mobility;Stand;Sit;Lift;Transfers;Locomotion Level;Carry    Stability/Clinical Decision Making Stable/Uncomplicated    Rehab Potential Fair    PT Frequency 2x / week    PT Duration 8 weeks    PT Treatment/Interventions Therapeutic activities;Therapeutic exercise;Gait training;Balance training;Neuromuscular re-education;Patient/family education;Manual techniques;Dry needling;Electrical Stimulation;Iontophoresis 4mg /ml Dexamethasone    PT Next Visit Plan Posture, thoracic extension, trunk and glute strength, lumbopelvic control during gait, manual techniques, modalities PRN    PT Home Exercise Plan Medbridge Access Code: [email or web address]    Consulted and Agree with Plan of Care Patient                 Joneen Boers PT, DPT  10/14/2022, 1:34 PM

## 2022-10-20 ENCOUNTER — Ambulatory Visit: Payer: Medicare PPO

## 2022-10-20 DIAGNOSIS — Z9181 History of falling: Secondary | ICD-10-CM

## 2022-10-20 DIAGNOSIS — M6281 Muscle weakness (generalized): Secondary | ICD-10-CM

## 2022-10-20 DIAGNOSIS — R262 Difficulty in walking, not elsewhere classified: Secondary | ICD-10-CM | POA: Diagnosis not present

## 2022-10-20 NOTE — Therapy (Signed)
OUTPATIENT PHYSICAL THERAPY TREATMENT NOTE     Patient Name: Joyce Evans MRN: [id number] DOB:1941/06/19, 82 y.o., female Today's Date: 10/20/2022  PCP: Kandyce Rud, MD  REFERRING PROVIDER: Rulon Abide, NP   PT End of Session - 10/20/22 1633     Visit Number 57    Number of Visits 65    Date for PT Re-Evaluation 10/22/22    Authorization Type [employer]    Authorization Time Period 06/16/22-10/09/22    Authorization - Number of Visits 34    Progress Note Due on Visit 50    PT Start Time 1633    PT Stop Time 1717    PT Time Calculation (min) 44 min    Equipment Utilized During Treatment Oxygen    Activity Tolerance Patient tolerated treatment well;No increased pain    Behavior During Therapy WFL for tasks assessed/performed                                                             Past Medical History:  Diagnosis Date   Actinic keratosis    Arthritis    osteo - shoulders, fingers   Atrial fibrillation (HCC)    Dizziness    thinks because of diuretic   Family history of adverse reaction to anesthesia    daughter -PONV   Gallstones 2016, 2017   GERD (gastroesophageal reflux disease)    Glaucoma    Heart murmur    history of   Hypertension    Melanoma (HCC) 04/20/2016   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 Excised: 05/19/2016, margins free.   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   Tricuspid valve disorder    leak   Past Surgical History:  Procedure Laterality Date   ABDOMINAL HYSTERECTOMY  1987   partial   CARDIAC CATHETERIZATION  2010   [facility]   CATARACT EXTRACTION W/PHACO Right 08/28/2015   Procedure: CATARACT EXTRACTION PHACO AND INTRAOCULAR LENS PLACEMENT (IOC);  Surgeon: Lockie Mola, MD;  Location: [facility];  Service:  Ophthalmology;  Laterality: Right;  TORIC   INTRAMEDULLARY (IM) NAIL INTERTROCHANTERIC Right 10/16/2021   Procedure: INTRAMEDULLARY (IM) NAIL INTERTROCHANTRIC;  Surgeon: Ross Marcus, MD;  Location: [facility];  Service: Orthopedics;  Laterality: Right;   MELANOMA EXCISION Left 05/19/2016   MIS. Left mid. lat. tricep. 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 S/P IM nail placement for right intertrochanteric hip fracture with Dr. Okey Dupre on 10/16/2021. Pt slipped on the kithen floor. Pt was getting something from the microwave and pt tripped on her O2 tube. Pt was independent with ambulation prior to her fall. Pt fell before  in the woods and pt tripped over a root 3 years ago (no injuries per pt). Pt has been using supplemental O2 for the past 2 years in November. Pt has interstitial lung disease (idiopathic pulmonary fibrosis). Recent breathing test is stable. Back pain began when pt was in Rehab in [place] [facility]. Feels like it is sciatical. Pt radiates along the sciatic nerve and R L 5 dermatome. Pt is healing fine as far as her R hip surgery is concerned. Also has R medial knee pain due to arthritis. Had home health PT which involves seated knee extension, hip flexion, standing hip abduction, extension. Pt did some walking wiht a rw. Feels a lot of pain all over. Walking too far is limited due to her O2 situation.  PRECAUTIONS: SpO2 not under 90 %  SUBJECTIVE: Doing good. R knee has really been bothering her. R lateral hip and medial knee have been bothering her. Called  pulmonary rehab and was told to wait to schedule until done with PT.     PAIN:  Are you having pain? See subjective       TODAY'S TREATMENT:   10/20/2022    TTP R medial joint line   Therapeutic exercise At start of session:   91% at 4 L at start of session, time taken to allow for increased oxygen saturation.   Prior to exercises:  98%, HR 65 on 4 L O2  Gait with SPC on R, 199 ft   84%, HR 60, improves to 99%, HR 64 after rest  Gait with SPC on R, 200 ft   84%, HR 98, improves to 98%, HR 68 after rest   Standing with B UE assist   Hip abduction (no resistance)   R 10x5 seconds with eccentric return    Then with red band around ankles 10x3  Standing B shoulder extension resisting red band 10x3  Stepping over 4 mini hurdles with SPC 2x  90%, HR 97, improves to 98%, HR 60 after rest      Therapeutic rest breaks utilized for oxygen and muscle recovery.    Improved exercise technique, movement at target joints, use of target muscles after min to mod verbal, visual, tactile cues.       Response to treatment  Pt tolerated session well without aggravation of symptoms. Decreased R hip and knee pain reported after session.     Clinical impression  Continued working on gait with SPC to promote mobility with least restrictive AD at home. Worked on resisted concentric hip abduction open chain for R LE to provide proper stress to R greater trochanter to promote healing and decrease pain as well as improve R LE single stance strength during gait. Decreased R hip and knee pain reported after session. Pt will benefit from continued skilled physical therapy services to improve ability to ambulate and perform standing tasks with less difficulty as well as to continue to decrease low back pain.      PATIENT EDUCATION: Education details: ther-ex, HEP Person educated: Patient Education method: Explanation, Demonstration, Tactile cues, Verbal cues, and  Handouts Education comprehension: verbalized understanding and returned demonstration   HOME EXERCISE PROGRAM: Access Code: [email or web address] URL: [email or web address] Date: 01/20/2022 Prepared by: Loralyn Freshwater  Exercises - Seated Transversus Abdominis Bracing  - 1 x daily - 7 x weekly - 3 sets - 10 reps - 5 seconds hold - Seated Gluteal Sets  - 1 x daily - 7 x weekly - 3 sets - 10 reps - 5 seconds  hold - Continued again 06/29/2022: Seated Scapular Retraction  - 1 x daily - 7 x weekly - 3 sets - 10 reps - 5 seconds  hold  Can perform aforementioned exercises in standing now.    - Seated Hip Adduction Isometrics with Ball  - 1 x daily - 7 x weekly - 2 sets - 10 reps - 5 seconds hold - Standing Hip Abduction with Counter Support  - 1 x daily - 7 x weekly - 3 sets - 10 reps  Upgraded to red band on 10/05/2022  - Discontinued on 03/09/2022: Seated Hip Abduction with Resistance  - 1 x daily - 7 x weekly - 2 sets - 10 reps  - Supine Posterior Pelvic Tilt  - 3 x daily - 7 x weekly - 3 sets - 10 reps - 5 seconds hold - Hooklying Clamshell with Resistance  - 1 x daily - 7 x weekly - 3 sets - 10 reps - Standing Posterior Pelvic Tilt  - 3 x daily - 7 x weekly - 3 sets - 10 reps - 5 seconds hold - Seated Passive Cervical Retraction  - 1 x daily - 7 x weekly - 3 sets - 10 reps - 5 seconds hold  Seated R knee flexion targeting medial hamstrings, green band 10x3  - Seated Hip External Rotation with Resistance  - 1 x daily - 7 x weekly - 3 sets - 10 reps  Red band        PT Short Term Goals - 02/16/22 0941       PT SHORT TERM GOAL #1   Title Pt will be independent with her initial HEP to decrease pain, improve strength, function, and ability to stand and ambulate more comfortably.    Baseline No questions with HEP, has been doing them (02/16/2022)    Time 3    Period Weeks    Status Achieved    Target Date 02/05/22              PT Long Term Goals - 10/05/22 1159       PT  LONG TERM GOAL #1   Title Pt will have a decrease in low back pain to 3/10 or less at worst to promote ability to stand and ambulate with less difficulty.    Baseline 6/10 low back pain at worst for the past 2 months (01/12/2022); 7/10 at most for the past 7 days (standing in the kitchen, more standing than normal becuase pt had guests over from [facility]) ( 02/16/2022);  6-7/10 back pain at worst for the past 7 days. Back pain at worst goes away faster since starting PT. (03/09/2022). R low back pain has not been bothering her. Thoracic back pain is about a 6/10 at worst for the past 7 days (04/02/2022); 4/10 low back pain at worst for the past 7 days (05/06/2022); 4-5/10 at worst for the past 7 days, comes and goes (05/20/2022); 4/10 R low back at most for the past 7 days, 5/10 thoracic back pain at most for the past 7 days (07/02/2022); 08/27/22:  6-7/10 NPS. 4/10 low back pain at most for the past 7 days (original area of pain), 6/10 mid back pain at worst for the past 7 days, but fine when lying down (09/03/2022); 4/10 back pain at worst for the past 7 days. (10/05/2022)    Time 8    Period Weeks    Status Partially Met    Target Date 10/22/22  PT LONG TERM GOAL #2   Title Pt will have a decrease in R LE pain to 3/10 or less at worst to promote ability to stand and ambulate will less difficulty.    Baseline 9/10 R LE pain at worst (01/12/2022); 3/10 R sciatic and L5 dermatome pain at most for the past 7 days; has R > L heel pain however which wakes her up at night. (02/16/2022); 4/10 R LE pain at most for the past 7 days. R heel pain however wakes her up at night. (03/12/2022), 3/10 R LE pain at worst for the past 7 days (04/02/2022); R heel bothered her last night (possibly plantar fasciitis), 3/10 R LE pain at most for the past 7 days (05/06/2022);    Time 8    Period Weeks    Status Achieved    Target Date 05/07/22      PT LONG TERM GOAL #3   Title Pt will improve her FOTO score by at least 10  points as a demontsration of improved function.    Baseline Lumbar Spine FOTO 39 (01/12/2022); 46 (02/16/2022); 52 (03/12/2022); 49 (06/22/2022)    Time 8    Period Weeks    Status Achieved    Target Date 05/07/22      PT LONG TERM GOAL #4   Title Pt will be able to ambulate at least 100 ft independently to promote mobility and return to prior level of function.    Baseline Able to ambulate with rw short distance, about 40 ft with reproduction of pain. (01/12/2022); able to ambulate 120 ft with rw, SBA (02/16/2022); SPC 40 ft, CGA (03/12/2022); Pt able to ambulate 88 ft with SPC on L side (03/31/2022); 210 ft with SPC on L side, rw 200 ft, no pain (05/06/2022); SPC on L side 200 ft, then no AD but CGA 100 ft x 2 with rest break in between bouts, no back pain reported (05/20/2022); No AD, 100 ft, SBA (07/02/2022); no AD, 100' supervision, L antalgic gait.    Time 8    Period Weeks    Status Partially Met    Target Date 10/22/22      PT LONG TERM GOAL #5   Title Pt will improve 30 second STS test to > / = 12 reps to demonstrate clinically significant improvement in LE endurance and strength for transfers and gait for age matched norms.    Baseline 08/27/22: 10 reps. 8th decade of life norm is 12 reps; 12 reps in 30 seconds, no UE assist (09/03/2022)    Time 8    Period Weeks    Status Achieved    Target Date 10/22/22      PT LONG TERM GOAL #6   Title Pt will improve 2 minute walk test by the MCID of 40' to demonstrate improved capacity to complete household and short community distances at a decreased risk for falls.    Baseline 08/27/22: 263' no AD    Time 8    Period Weeks    Status Deferred    Target Date 10/22/22      PT LONG TERM GOAL #7   Title Pt will improve self selected gait speed to 1 m/s to demonstrate decreased falls risk with short community distances.    Baseline 08/27/22: self selected .75 m/s. Fast paced, .87 m/s; self selected 0.77 m/s (09/03/2022)    Time 8    Period Weeks     Status On-going    Target Date 10/22/22  Plan - 10/20/22 1632     Clinical Impression Statement Continued working on gait with SPC to promote mobility with least restrictive AD at home. Worked on resisted concentric hip abduction open chain for R LE to provide proper stress to R greater trochanter to promote healing and decrease pain as well as improve R LE single stance strength during gait. Decreased R hip and knee pain reported after session. Pt will benefit from continued skilled physical therapy services to improve ability to ambulate and perform standing tasks with less difficulty as well as to continue to decrease low back pain.    Personal Factors and Comorbidities Comorbidity 3+;Age;Time since onset of injury/illness/exacerbation;Fitness    Comorbidities Arthritis, atrial fibrillation, HTN, idiopathic pulmonary fibrosis    Examination-Activity Limitations Stairs;Bed Mobility;Stand;Sit;Lift;Transfers;Locomotion Level;Carry    Stability/Clinical Decision Making Stable/Uncomplicated    Clinical Decision Making Low    Rehab Potential Fair    PT Frequency 2x / week    PT Duration 8 weeks    PT Treatment/Interventions Therapeutic activities;Therapeutic exercise;Gait training;Balance training;Neuromuscular re-education;Patient/family education;Manual techniques;Dry needling;Electrical Stimulation;Iontophoresis 4mg /ml Dexamethasone    PT Next Visit Plan Posture, thoracic extension, trunk and glute strength, lumbopelvic control during gait, manual techniques, modalities PRN    PT Home Exercise Plan Medbridge Access Code: [email or web address]    Consulted and Agree with Plan of Care Patient                 Loralyn FreshwaterMiguel Taner Rzepka PT, DPT  10/20/2022, 5:29 PM

## 2022-10-22 ENCOUNTER — Ambulatory Visit: Payer: Medicare PPO

## 2022-10-22 DIAGNOSIS — R262 Difficulty in walking, not elsewhere classified: Secondary | ICD-10-CM | POA: Diagnosis not present

## 2022-10-22 DIAGNOSIS — M5459 Other low back pain: Secondary | ICD-10-CM

## 2022-10-22 DIAGNOSIS — Z9181 History of falling: Secondary | ICD-10-CM

## 2022-10-22 DIAGNOSIS — M6281 Muscle weakness (generalized): Secondary | ICD-10-CM

## 2022-10-22 DIAGNOSIS — M5417 Radiculopathy, lumbosacral region: Secondary | ICD-10-CM

## 2022-10-22 DIAGNOSIS — M5431 Sciatica, right side: Secondary | ICD-10-CM

## 2022-10-22 NOTE — Therapy (Signed)
OUTPATIENT PHYSICAL THERAPY TREATMENT NOTE And Discharge Summary     Patient Name: Joyce Evans MRN: [id number] DOB:Jan 18, 1941, 82 y.o., female Today's Date: 10/22/2022  PCP: Kandyce Rud, MD  REFERRING PROVIDER: Rulon Abide, NP   PT End of Session - 10/22/22 1632     Visit Number 58    Number of Visits 65    Date for PT Re-Evaluation 10/22/22    Authorization Type [employer]    Authorization Time Period 06/16/22-10/09/22    Authorization - Number of Visits 34    Progress Note Due on Visit 50    PT Start Time 1632    PT Stop Time 1715    PT Time Calculation (min) 43 min    Equipment Utilized During Treatment Oxygen    Activity Tolerance Patient tolerated treatment well;No increased pain    Behavior During Therapy WFL for tasks assessed/performed                                                              Past Medical History:  Diagnosis Date   Actinic keratosis    Arthritis    osteo - shoulders, fingers   Atrial fibrillation (HCC)    Dizziness    thinks because of diuretic   Family history of adverse reaction to anesthesia    daughter -PONV   Gallstones 2016, 2017   GERD (gastroesophageal reflux disease)    Glaucoma    Heart murmur    history of   Hypertension    Melanoma (HCC) 04/20/2016   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 Excised: 05/19/2016, margins free.   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   Tricuspid valve disorder    leak   Past Surgical History:  Procedure Laterality Date   ABDOMINAL HYSTERECTOMY  1987   partial   CARDIAC CATHETERIZATION  2010   [facility]   CATARACT EXTRACTION W/PHACO Right 08/28/2015   Procedure: CATARACT EXTRACTION PHACO AND INTRAOCULAR LENS PLACEMENT (IOC);  Surgeon: Lockie Mola, MD;  Location:  [facility];  Service: Ophthalmology;  Laterality: Right;  TORIC   INTRAMEDULLARY (IM) NAIL INTERTROCHANTERIC Right 10/16/2021   Procedure: INTRAMEDULLARY (IM) NAIL INTERTROCHANTRIC;  Surgeon: Ross Marcus, MD;  Location: [facility];  Service: Orthopedics;  Laterality: Right;   MELANOMA EXCISION Left 05/19/2016   MIS. Left mid. lat. tricep. 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 S/P IM nail placement for right intertrochanteric hip fracture with Dr. Okey Dupre on 10/16/2021. Pt slipped on the kithen floor. Pt was getting something from the microwave and pt  tripped on her O2 tube. Pt was independent with ambulation prior to her fall. Pt fell before in the woods and pt tripped over a root 3 years ago (no injuries per pt). Pt has been using supplemental O2 for the past 2 years in November. Pt has interstitial lung disease (idiopathic pulmonary fibrosis). Recent breathing test is stable. Back pain [facility]. Feels like it is sciatical. Pt radiates along the sciatic nerve and R L 5 dermatome. Pt is healing fine as far as her R hip surgery is concerned. Also has R medial knee pain due to arthritis. Had home health PT which involves seated knee extension, hip flexion, standing hip abduction, extension. Pt did some walking wiht a rw. Feels a lot of pain all over. Walking too far is limited due to her O2 situation.  PRECAUTIONS: SpO2 not under 90  %  SUBJECTIVE: No back pain currently. R knee is still a little bit sore. Able to ambulate short distances at home without AD.    PAIN:  Are you having pain? See subjective       TODAY'S TREATMENT:   10/22/2022    TTP R medial joint line   Therapeutic exercise At start of session:   96%, HR 77 at 4 L at start of session   Gait x 10 meters with SPC 2x  12.72 sec, 12.12 seconds (12.42 seconds average; 0.81 m/sec average gait speed)  86% afterwards, improved to 97% after rest   Gait 100 ft without AD, able to perform independently  84 %, increases to 98% after rest   Reviewed progress with PT towards goals.     Stepping over 4 mini hurdles with SPC 2x2  After first set 88%, HR 99, improves to 97%, HR 62 after rest    After second set 83%, HR 103, improves to 98%, HR 73 after rest   Standing with B UE assist   Hip abduction    R with red band around ankles 10x3  Standing B shoulder extension resisting red band 10x3    91 %, HR 103 afterwards, improves to 98%, HR 75 after rest.    Seated chin tucks to promote thoracic extension and decrease stress to low back 10x5 seconds for 2 sets    Therapeutic rest breaks utilized for oxygen and muscle recovery.    Improved exercise technique, movement at target joints, use of target muscles after min verbal, visual, tactile cues.       Response to treatment  Pt tolerated session well without aggravation of symptoms.      Clinical impression  Pt demonstrates overall decreased low back and R LE pain, improved functional LE strength, ability to ambulate with rollator and SPC, and improved function since initial evaluation. Skilled physical therapy services discharged with pt continuing with her exercises at home. Challenges to progress include bradycardia and pulmonary fibrosis.      PATIENT EDUCATION: Education details: ther-ex, HEP Person educated: Patient Education method: Explanation, Demonstration,  Tactile cues, Verbal cues, and Handouts Education comprehension: verbalized understanding and returned demonstration   HOME EXERCISE PROGRAM: Access Code: [email or web address] URL: [email or web address] Date: 01/20/2022 Prepared by: Loralyn FreshwaterMiguel Arienna Benegas  Exercises - Seated Transversus Abdominis Bracing  - 1 x daily - 7 x weekly - 3 sets - 10 reps - 5 seconds hold - Seated Gluteal Sets  - 1 x daily - 7 x weekly - 3 sets - 10 reps - 5 seconds hold - Continued again 06/29/2022:  Seated Scapular Retraction  - 1 x daily - 7 x weekly - 3 sets - 10 reps - 5 seconds  hold  Can perform aforementioned exercises in standing now.    - Seated Hip Adduction Isometrics with Ball  - 1 x daily - 7 x weekly - 2 sets - 10 reps - 5 seconds hold - Standing Hip Abduction with Counter Support  - 1 x daily - 7 x weekly - 3 sets - 10 reps  Upgraded to red band on 10/05/2022  - Discontinued on 03/09/2022: Seated Hip Abduction with Resistance  - 1 x daily - 7 x weekly - 2 sets - 10 reps  - Supine Posterior Pelvic Tilt  - 3 x daily - 7 x weekly - 3 sets - 10 reps - 5 seconds hold - Hooklying Clamshell with Resistance  - 1 x daily - 7 x weekly - 3 sets - 10 reps - Standing Posterior Pelvic Tilt  - 3 x daily - 7 x weekly - 3 sets - 10 reps - 5 seconds hold - Seated Passive Cervical Retraction  - 1 x daily - 7 x weekly - 3 sets - 10 reps - 5 seconds hold  Seated R knee flexion targeting medial hamstrings, green band 10x3  - Seated Hip External Rotation with Resistance  - 1 x daily - 7 x weekly - 3 sets - 10 reps  Red band        PT Short Term Goals - 02/16/22 0941       PT SHORT TERM GOAL #1   Title Pt will be independent with her initial HEP to decrease pain, improve strength, function, and ability to stand and ambulate more comfortably.    Baseline No questions with HEP, has been doing them (02/16/2022)    Time 3    Period Weeks    Status Achieved    Target Date 02/05/22              PT Long Term  Goals - 10/22/22 1644       PT LONG TERM GOAL #1   Title Pt will have a decrease in low back pain to 3/10 or less at worst to promote ability to stand and ambulate with less difficulty.    Baseline 6/10 low back pain at worst for the past 2 months (01/12/2022); 7/10 at most for the past 7 days (standing in the kitchen, more standing than normal becuase pt had guests over from [facility]) ( 02/16/2022);  6-7/10 back pain at worst for the past 7 days. Back pain at worst goes away faster since starting PT. (03/09/2022). R low back pain has not been bothering her. Thoracic back pain is about a 6/10 at worst for the past 7 days (04/02/2022); 4/10 low back pain at worst for the past 7 days (05/06/2022); 4-5/10 at worst for the past 7 days, comes and goes (05/20/2022); 4/10 R low back at most for the past 7 days, 5/10 thoracic back pain at most for the past 7 days (07/02/2022); 08/27/22:  6-7/10 NPS. 4/10 low back pain at most for the past 7 days (original area of pain), 6/10 mid back pain at worst for the past 7 days, but fine when lying down (09/03/2022); 4/10 back pain at worst for the past 7 days. (10/05/2022)    Time 8    Period Weeks    Status Partially Met    Target Date 10/22/22      PT LONG TERM  GOAL #2   Title Pt will have a decrease in R LE pain to 3/10 or less at worst to promote ability to stand and ambulate will less difficulty.    Baseline 9/10 R LE pain at worst (01/12/2022); 3/10 R sciatic and L5 dermatome pain at most for the past 7 days; has R > L heel pain however which wakes her up at night. (02/16/2022); 4/10 R LE pain at most for the past 7 days. R heel pain however wakes her up at night. (03/12/2022), 3/10 R LE pain at worst for the past 7 days (04/02/2022); R heel bothered her last night (possibly plantar fasciitis), 3/10 R LE pain at most for the past 7 days (05/06/2022);    Time 8    Period Weeks    Status Achieved    Target Date 05/07/22      PT LONG TERM GOAL #3   Title Pt will improve  her FOTO score by at least 10 points as a demontsration of improved function.    Baseline Lumbar Spine FOTO 39 (01/12/2022); 46 (02/16/2022); 52 (03/12/2022); 49 (06/22/2022)    Time 8    Period Weeks    Status Achieved    Target Date 05/07/22      PT LONG TERM GOAL #4   Title Pt will be able to ambulate at least 100 ft independently to promote mobility and return to prior level of function.    Baseline Able to ambulate with rw short distance, about 40 ft with reproduction of pain. (01/12/2022); able to ambulate 120 ft with rw, SBA (02/16/2022); SPC 40 ft, CGA (03/12/2022); Pt able to ambulate 88 ft with SPC on L side (03/31/2022); 210 ft with SPC on L side, rw 200 ft, no pain (05/06/2022); SPC on L side 200 ft, then no AD but CGA 100 ft x 2 with rest break in between bouts, no back pain reported (05/20/2022); No AD, 100 ft, SBA (07/02/2022); no AD, 100' supervision, L antalgic gait; 100 ft no AD, independent. (10/22/2022)    Time 8    Period Weeks    Status Achieved    Target Date 10/22/22      PT LONG TERM GOAL #5   Title Pt will improve 30 second STS test to > / = 12 reps to demonstrate clinically significant improvement in LE endurance and strength for transfers and gait for age matched norms.    Baseline 08/27/22: 10 reps. 8th decade of life norm is 12 reps; 12 reps in 30 seconds, no UE assist (09/03/2022)    Time 8    Period Weeks    Status Achieved    Target Date 10/22/22      PT LONG TERM GOAL #6   Title Pt will improve 2 minute walk test by the MCID of 40' to demonstrate improved capacity to complete household and short community distances at a decreased risk for falls.    Baseline 08/27/22: 263' no AD    Time 8    Period Weeks    Status Deferred    Target Date 10/22/22      PT LONG TERM GOAL #7   Title Pt will improve self selected gait speed to 1 m/s to demonstrate decreased falls risk with short community distances.    Baseline 08/27/22: self selected .75 m/s. Fast paced, .87 m/s; self  selected 0.77 m/s (09/03/2022); 0.81 m/sec self selected (felt comfortable for pt). (10/22/2022)    Time 8    Period Weeks  Status Partially Met    Target Date 10/22/22              Plan - 10/22/22 1629     Clinical Impression Statement Pt demonstrates overall decreased low back and R LE pain, improved functional LE strength, ability to ambulate with rollator and SPC, and improved function since initial evaluation. Skilled physical therapy services discharged with pt continuing with her exercises at home. Challenges to progress include bradycardia and pulmonary fibrosis.    Personal Factors and Comorbidities Comorbidity 3+;Age;Time since onset of injury/illness/exacerbation;Fitness    Comorbidities Arthritis, atrial fibrillation, HTN, idiopathic pulmonary fibrosis    Examination-Activity Limitations Stairs;Bed Mobility;Stand;Sit;Lift;Transfers;Locomotion Level;Carry    Stability/Clinical Decision Making --    Rehab Potential --    PT Frequency --    PT Duration --    PT Treatment/Interventions Therapeutic activities;Therapeutic exercise;Gait training;Balance training;Neuromuscular re-education;Patient/family education;Manual techniques    PT Next Visit Plan Continue progress with her exercises at home.    PT Home Exercise Plan Medbridge Access Code: [email or web address]    Consulted and Agree with Plan of Care Patient              Thank you for your referral.    Loralyn Freshwater PT, DPT  10/22/2022, 5:23 PM

## 2022-10-27 ENCOUNTER — Ambulatory Visit: Payer: Medicare PPO

## 2022-10-28 ENCOUNTER — Encounter: Payer: Medicare PPO | Attending: Internal Medicine

## 2022-10-28 ENCOUNTER — Other Ambulatory Visit: Payer: Self-pay

## 2022-10-28 DIAGNOSIS — Z713 Dietary counseling and surveillance: Secondary | ICD-10-CM | POA: Insufficient documentation

## 2022-10-28 DIAGNOSIS — J849 Interstitial pulmonary disease, unspecified: Secondary | ICD-10-CM | POA: Insufficient documentation

## 2022-10-28 NOTE — Progress Notes (Signed)
Virtual Visit completed. Patient informed on EP and RD appointment and 6 Minute walk test. Patient also informed of patient health questionnaires on My Chart. Patient Verbalizes understanding. Visit diagnosis can be found in [facility] 09/10/2022.

## 2022-11-02 ENCOUNTER — Encounter: Payer: Medicare PPO | Admitting: *Deleted

## 2022-11-02 VITALS — Ht 62.0 in | Wt 129.0 lb

## 2022-11-02 DIAGNOSIS — J849 Interstitial pulmonary disease, unspecified: Secondary | ICD-10-CM | POA: Diagnosis not present

## 2022-11-02 DIAGNOSIS — Z713 Dietary counseling and surveillance: Secondary | ICD-10-CM | POA: Diagnosis present

## 2022-11-02 NOTE — Progress Notes (Signed)
Pulmonary Individual Treatment Plan  Patient Details  Name: Joyce Evans MRN: [id number] Date of Birth: 20-Mar-1941 Referring Provider:   Flowsheet Row Pulmonary Rehab from 11/02/2022 in [facility] Cardiac and Pulmonary Rehab  Referring Provider Kloefkorn       Initial Encounter Date:  Flowsheet Row Pulmonary Rehab from 11/02/2022 in [facility] Cardiac and Pulmonary Rehab  Date 11/02/22       Visit Diagnosis: ILD (interstitial lung disease)  Patient's Home Medications on Admission:  Current Outpatient Medications:    acetaminophen (TYLENOL) 500 MG tablet, Take 500 mg by mouth every 6 (six) hours as needed., Disp: , Rfl:    amLODipine (NORVASC) 5 MG tablet, Take 5 mg by mouth daily. (Patient not taking: Reported on 10/16/2021), Disp: , Rfl:    apixaban (ELIQUIS) 5 MG TABS tablet, Take 5 mg by mouth 2 (two) times daily.  (Patient not taking: Reported on 10/28/2022), Disp: , Rfl:    apixaban (ELIQUIS) 5 MG TABS tablet, Take 1 tablet by mouth 2 (two) times daily., Disp: , Rfl:    Apple Cider Vinegar 300 MG TABS, Take 1 tablet by mouth daily., Disp: , Rfl:    atorvastatin (LIPITOR) 40 MG tablet, Take by mouth. (Patient not taking: Reported on 10/28/2022), Disp: , Rfl:    atorvastatin (LIPITOR) 40 MG tablet, Take 1 tablet by mouth daily., Disp: , Rfl:    brimonidine (ALPHAGAN P) 0.1 % SOLN, Place 1 drop into the right eye 2 (two) times daily., Disp: , Rfl:    brimonidine (ALPHAGAN) 0.2 % ophthalmic solution, SMARTSIG:In Eye(s) (Patient not taking: Reported on 10/28/2022), Disp: , Rfl:    Brinzolamide-Brimonidine 1-0.2 % SUSP, Place 1 drop into the left eye 2 (two) times daily., Disp: , Rfl:    carvedilol (COREG) 6.25 MG tablet, Take 1 tablet (6.25 mg total) by mouth 2 (two) times daily with a meal. (Patient not taking: Reported on 10/28/2022), Disp: , Rfl:    cetirizine (ZYRTEC) 10 MG tablet, Take 10 mg by mouth daily., Disp: , Rfl:    Cholecalciferol (VITAMIN D3) 50 MCG (2000 UT) capsule, Take by mouth.,  Disp: , Rfl:    Cholecalciferol 50 MCG (2000 UT) CAPS, Take 3 capsules by mouth in the morning. (Patient not taking: Reported on 10/28/2022), Disp: , Rfl:    citalopram (CELEXA) 20 MG tablet, Take 10 mg by mouth daily. (Patient not taking: Reported on 10/28/2022), Disp: , Rfl:    citalopram (CELEXA) 20 MG tablet, Take by mouth., Disp: , Rfl:    famotidine (PEPCID) 40 MG tablet, Take 40 mg by mouth at bedtime., Disp: , Rfl:    fluorouracil (EFUDEX) 5 % cream, Apply topically 2 (two) times daily. On August 15 start cream to nose bid for 7 days (Patient not taking: Reported on 10/28/2022), Disp: 15 g, Rfl: 0   fluticasone (FLONASE) 50 MCG/ACT nasal spray, Place 2 sprays into the nose daily., Disp: , Rfl:    fluticasone (FLONASE) 50 MCG/ACT nasal spray, Place into the nose., Disp: , Rfl:    furosemide (LASIX) 20 MG tablet, Take 20 mg by mouth daily., Disp: , Rfl:    furosemide (LASIX) 20 MG tablet, TAKE 1 TABLET ONE TIME DAILY, Disp: , Rfl:    gabapentin (NEURONTIN) 300 MG capsule, Take 1 capsule (300 mg total) by mouth 2 (two) times daily for 14 days., Disp: 28 capsule, Rfl: 0   losartan (COZAAR) 50 MG tablet, Take 1 tablet by mouth daily. (Patient not taking: Reported on 10/16/2021), Disp: , Rfl:  magnesium gluconate 54mg /59ml syringe, Take by mouth., Disp: , Rfl:    methocarbamol (ROBAXIN) 500 MG tablet, Take 1 tablet (500 mg total) by mouth every 8 (eight) hours as needed for muscle spasms., Disp: , Rfl:    multivitamin-iron-minerals-folic acid (CENTRUM) chewable tablet, Chew 1 tablet by mouth daily., Disp: , Rfl:    omeprazole (PRILOSEC) 40 MG capsule, Take by mouth., Disp: , Rfl:    pantoprazole (PROTONIX) 40 MG tablet, Take 40 mg by mouth daily., Disp: , Rfl:    predniSONE (DELTASONE) 1 MG tablet, Take 1 tablet by mouth See admin instructions. Take 9 tablets (9 mg total) by mouth once daily for 14 days, THEN 8 tablets (8 mg total) once daily for 14 days, THEN 7 tablets (7 mg total) once daily for 14  days, THEN 6 tablets (6 mg total) once daily for 14 days, THEN 5 tablets (5 mg total) once daily for 14 days, THEN 4 tablets (4 mg total) once daily for 14 days, THEN 3 tablets (3 mg total) once daily for 14 days, THEN 2 tablets (2 mg total) once daily for 14 days, THEN 1 tablet (1 mg total) once daily for 14 days., Disp: , Rfl:    Probiotic Product (PROBIOTIC DAILY PO), Take by mouth daily. (Patient not taking: Reported on 10/28/2022), Disp: , Rfl:    senna-docusate (SENOKOT-S) 8.6-50 MG tablet, Take 1 tablet by mouth 2 (two) times daily., Disp: , Rfl:    timolol (BETIMOL) 0.5 % ophthalmic solution, Place 1 drop into both eyes daily., Disp: , Rfl:    White Petrolatum-Mineral Oil (SYSTANE NIGHTTIME) OINT, Apply 1 application. to eye at bedtime., Disp: , Rfl:    ZINC CITRATE PO, Take 1 tablet by mouth 2 (two) times daily. 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 Excised: 05/19/2016, margins free.   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   Tricuspid valve disorder    leak    Tobacco Use: Social History   Tobacco Use  Smoking Status Never  Smokeless Tobacco Never    Labs: Review Flowsheet       Latest Ref Rng & Units 06/17/2020 06/18/2020  Labs for ITP Cardiac and Pulmonary Rehab   Cholestrol 0 - 200 mg/dL - 409   LDL (calc) 0 - 99 mg/dL - 811   Direct LDL 0 - 99 mg/dL 914.7  -  HDL-C >82 mg/dL - 51   Trlycerides <956 mg/dL - 57   Hemoglobin O1H 4.8 - 5.6 % 5.4  -     Pulmonary Assessment Scores:  Pulmonary Assessment Scores  Row Name 11/02/22 1737         ADL UCSD   ADL Phase Entry     SOB Score total 67     Rest 2     Walk 3     Stairs 4     Bath 2     Dress 2     Shop 3       CAT Score   CAT Score 23       mMRC Score   mMRC Score 2              UCSD: Self-administered rating of dyspnea associated with activities of daily living (ADLs) 6-point scale (0 = "not at all" to 5 = "maximal or unable to do because of breathlessness")  Scoring Scores range from 0 to 120.  Minimally important difference is 5 units  CAT: CAT can identify the health impairment of COPD patients and is better correlated with disease progression.  CAT has a scoring range of zero to 40. The CAT score is classified into four groups of low (less than 10), medium (10 - 20), high (21-30) and very high (31-40) based on the impact level of disease on health status. A CAT score over 10 suggests significant symptoms.  A worsening CAT score could be explained by an exacerbation, poor medication adherence, poor inhaler technique, or progression of COPD or comorbid conditions.  CAT MCID is 2 points  mMRC: mMRC (Modified Medical Research Council) Dyspnea Scale is used to assess the degree of baseline functional disability in patients of respiratory disease due to dyspnea. No minimal important difference is established. A decrease in score of 1 point or greater is considered a positive change.   Pulmonary Function Assessment:  Pulmonary Function Assessment - 10/28/22 1350       Breath   Shortness of Breath Yes;Limiting activity             Exercise Target Goals: Exercise Program Goal: Individual exercise prescription set using results from initial 6 min walk test and  THRR while considering  patient's activity barriers and safety.   Exercise Prescription Goal: Initial exercise prescription builds to 30-45 minutes a day of aerobic activity, 2-3 days per week.  Home exercise guidelines will be given to patient during program as part of exercise prescription that the participant will acknowledge.  Education: Aerobic Exercise: - Group verbal and visual presentation on the components of exercise prescription. Introduces F.I.T.T principle from ACSM for exercise prescriptions.  Reviews F.I.T.T. principles of aerobic exercise including progression. Written material given at graduation.   Education: Resistance Exercise: - Group verbal and visual presentation on the components of exercise prescription. Introduces F.I.T.T principle from ACSM for exercise prescriptions  Reviews F.I.T.T. principles of resistance exercise including progression. Written material given at graduation. Flowsheet Row Pulmonary Rehab from 12/25/2020 in [facility] Cardiac and Pulmonary Rehab  Date 11/06/20  Educator AS  Instruction Review Code 1- Verbalizes Understanding        Education: Exercise & Equipment Safety: - Individual verbal instruction and demonstration of equipment use and safety with use of the equipment. Flowsheet Row Pulmonary Rehab from 11/02/2022 in [facility] Cardiac and Pulmonary Rehab  Date 11/02/22  Educator [facility]  Instruction Review Code 1- Verbalizes Understanding       Education: Exercise Physiology & General Exercise Guidelines: - Group verbal and written instruction with models to review the exercise physiology of the cardiovascular system and associated critical values. Provides general exercise guidelines with specific guidelines to those  with heart or lung disease.  Flowsheet Row Pulmonary Rehab from 12/25/2020 in [facility] Cardiac and Pulmonary Rehab  Date 10/23/20  Educator AS  Instruction Review Code 1- Verbalizes Understanding       Education: Flexibility, Balance,  Mind/Body Relaxation: - Group verbal and visual presentation with interactive activity on the components of exercise prescription. Introduces F.I.T.T principle from ACSM for exercise prescriptions. Reviews F.I.T.T. principles of flexibility and balance exercise training including progression. Also discusses the mind body connection.  Reviews various relaxation techniques to help reduce and manage stress (i.e. Deep breathing, progressive muscle relaxation, and visualization). Balance handout provided to take home. Written material given at graduation. Flowsheet Row Pulmonary Rehab from 12/25/2020 in [facility] Cardiac and Pulmonary Rehab  Date 09/11/20  Educator AS  Instruction Review Code 1- Verbalizes Understanding       Activity Barriers & Risk Stratification:  Activity Barriers & Cardiac Risk Stratification - 11/02/22 1728       Activity Barriers & Cardiac Risk Stratification   Activity Barriers Deconditioning;Muscular Weakness;Shortness of Breath;History of Falls;Balance Concerns;Joint Problems             6 Minute Walk:  6 Minute Walk     Row Name 11/02/22 1726         6 Minute Walk   Phase Initial     Distance 750 feet     Walk Time 6 minutes     # of Rest Breaks 0     MPH 1.42     METS 1.48     RPE 13     Perceived Dyspnea  2     VO2 Peak 5.16     Symptoms No     Resting HR 69 bpm     Resting BP 122/72     Resting Oxygen Saturation  96 %     Exercise Oxygen Saturation  during 6 min walk 85 %     Max Ex. HR 104 bpm     Max Ex. BP 124/72     2 Minute Post BP 114/62       Interval HR   1 Minute HR 89     2 Minute HR 88     3 Minute HR 97     4 Minute HR 99     5 Minute HR 102     6 Minute HR 104     2 Minute Post HR 60     Interval Heart Rate? Yes       Interval Oxygen   Interval Oxygen? Yes     Baseline Oxygen Saturation % 96 %     1 Minute Oxygen Saturation % 92 %     1 Minute Liters of Oxygen 4 L     2 Minute Oxygen Saturation % 87 %     2 Minute  Liters of Oxygen 4 L     3 Minute Oxygen Saturation % 87 %     3 Minute Liters of Oxygen 4 L     4 Minute Oxygen Saturation % 85 %     4 Minute Liters of Oxygen 4 L     5 Minute Oxygen Saturation % 85 %     5 Minute Liters of Oxygen 4 L     6 Minute Oxygen Saturation % 86 %     6 Minute Liters of Oxygen 4 L     2 Minute Post Oxygen Saturation % 91 %     2 Minute Post Liters  of Oxygen 4 L             Oxygen Initial Assessment:  Oxygen Initial Assessment - 11/02/22 1736       Home Oxygen   Home Oxygen Device Home Concentrator;Portable Concentrator;E-Tanks    Sleep Oxygen Prescription Continuous    Liters per minute 3    Liters per minute 3    Home Resting Oxygen Prescription Continuous    Liters per minute 2    Compliance with Home Oxygen Use Yes      Initial 6 min Walk   Oxygen Used Continuous;Portable Concentrator    Liters per minute 4      Program Oxygen Prescription   Program Oxygen Prescription Continuous    Liters per minute 3      Intervention   Short Term Goals To learn and exhibit compliance with exercise, home and travel O2 prescription;To learn and understand importance of maintaining oxygen saturations>88%;To learn and demonstrate proper use of respiratory medications;To learn and understand importance of monitoring SPO2 with pulse oximeter and demonstrate accurate use of the pulse oximeter.;To learn and demonstrate proper pursed lip breathing techniques or other breathing techniques.     Long  Term Goals Verbalizes importance of monitoring SPO2 with pulse oximeter and return demonstration;Exhibits proper breathing techniques, such as pursed lip breathing or other method taught during program session;Demonstrates proper use of MDI's;Compliance with respiratory medication;Maintenance of O2 saturations>88%;Exhibits compliance with exercise, home  and travel O2 prescription             Oxygen Re-Evaluation:   Oxygen Discharge (Final Oxygen  Re-Evaluation):   Initial Exercise Prescription:  Initial Exercise Prescription - 11/02/22 1700       Date of Initial Exercise RX and Referring Provider   Date 11/02/22    Referring Provider Kloefkorn      Oxygen   Oxygen Continuous    Liters 4    Maintain Oxygen Saturation 88% or higher      Treadmill   MPH 1    Grade 0    Minutes 15    METs 1.8      REL-XR   Level 1    Speed 50    Minutes 15    METs 1.48      Biostep-RELP   Level 1    SPM 50    Minutes 15    METs 1.48      Track   Laps 10    Minutes 15    METs 1.54      Prescription Details   Frequency (times per week) 2    Duration Progress to 30 minutes of continuous aerobic without signs/symptoms of physical distress      Intensity   THRR 40-80% of Max Heartrate 97-125    Ratings of Perceived Exertion 11-13    Perceived Dyspnea 0-4      Progression   Progression Continue to progress workloads to maintain intensity without signs/symptoms of physical distress.      Resistance Training   Training Prescription Yes    Weight 2    Reps 10-15             Perform Capillary Blood Glucose checks as needed.  Exercise Prescription Changes:   Exercise Prescription Changes     Row Name 11/02/22 1700             Response to Exercise   Blood Pressure (Admit) 122/72       Blood Pressure (Exercise) 124/72  Blood Pressure (Exit) 114/62       Heart Rate (Admit) 69 bpm       Heart Rate (Exercise) 104 bpm       Heart Rate (Exit) 72 bpm       Oxygen Saturation (Admit) 96 %       Oxygen Saturation (Exercise) 85 %       Oxygen Saturation (Exit) 97 %       Rating of Perceived Exertion (Exercise) 13       Perceived Dyspnea (Exercise) 2       Symptoms none       Comments 6 MWT results                Exercise Comments:   Exercise Goals and Review:   Exercise Goals     Row Name 11/02/22 1731             Exercise Goals   Increase Physical Activity Yes       Intervention Provide  advice, education, support and counseling about physical activity/exercise needs.;Develop an individualized exercise prescription for aerobic and resistive training based on initial evaluation findings, risk stratification, comorbidities and participant's personal goals.       Expected Outcomes Short Term: Attend rehab on a regular basis to increase amount of physical activity.;Long Term: Add in home exercise to make exercise part of routine and to increase amount of physical activity.;Long Term: Exercising regularly at least 3-5 days a week.       Increase Strength and Stamina Yes       Intervention Provide advice, education, support and counseling about physical activity/exercise needs.;Develop an individualized exercise prescription for aerobic and resistive training based on initial evaluation findings, risk stratification, comorbidities and participant's personal goals.       Expected Outcomes Short Term: Increase workloads from initial exercise prescription for resistance, speed, and METs.;Short Term: Perform resistance training exercises routinely during rehab and add in resistance training at home;Long Term: Improve cardiorespiratory fitness, muscular endurance and strength as measured by increased METs and functional capacity ( )       Able to understand and use rate of perceived exertion (RPE) scale Yes       Intervention Provide education and explanation on how to use RPE scale       Expected Outcomes Short Term: Able to use RPE daily in rehab to express subjective intensity level;Long Term:  Able to use RPE to guide intensity level when exercising independently       Able to understand and use Dyspnea scale Yes       Intervention Provide education and explanation on how to use Dyspnea scale       Expected Outcomes Short Term: Able to use Dyspnea scale daily in rehab to express subjective sense of shortness of breath during exertion;Long Term: Able to use Dyspnea scale to guide intensity  level when exercising independently       Knowledge and understanding of Target Heart Rate Range (THRR) Yes       Intervention Provide education and explanation of THRR including how the numbers were predicted and where they are located for reference       Expected Outcomes Short Term: Able to state/look up THRR;Long Term: Able to use THRR to govern intensity when exercising independently;Short Term: Able to use daily as guideline for intensity in rehab       Able to check pulse independently Yes       Intervention Provide education and demonstration  on how to check pulse in carotid and radial arteries.;Review the importance of being able to check your own pulse for safety during independent exercise       Expected Outcomes Short Term: Able to explain why pulse checking is important during independent exercise       Understanding of Exercise Prescription Yes       Intervention Provide education, explanation, and written materials on patient's individual exercise prescription       Expected Outcomes Short Term: Able to explain program exercise prescription;Long Term: Able to explain home exercise prescription to exercise independently                Exercise Goals Re-Evaluation :   Discharge Exercise Prescription (Final Exercise Prescription Changes):  Exercise Prescription Changes - 11/02/22 1700       Response to Exercise   Blood Pressure (Admit) 122/72    Blood Pressure (Exercise) 124/72    Blood Pressure (Exit) 114/62    Heart Rate (Admit) 69 bpm    Heart Rate (Exercise) 104 bpm    Heart Rate (Exit) 72 bpm    Oxygen Saturation (Admit) 96 %    Oxygen Saturation (Exercise) 85 %    Oxygen Saturation (Exit) 97 %    Rating of Perceived Exertion (Exercise) 13    Perceived Dyspnea (Exercise) 2    Symptoms none    Comments 6 MWT results             Nutrition:  Target Goals: Understanding of nutrition guidelines, daily intake of sodium 1500mg , cholesterol 200mg , calories 30%  from fat and 7% or less from saturated fats, daily to have 5 or more servings of fruits and vegetables.  Education: All About Nutrition: -Group instruction provided by verbal, written material, interactive activities, discussions, models, and posters to present general guidelines for heart healthy nutrition including fat, fiber, MyPlate, the role of sodium in heart healthy nutrition, utilization of the nutrition label, and utilization of this knowledge for meal planning. Follow up email sent as well. Written material given at graduation. Flowsheet Row Pulmonary Rehab from 12/25/2020 in [facility] Cardiac and Pulmonary Rehab  Date 11/20/20  Educator [facility]  Instruction Review Code 1- Verbalizes Understanding       Biometrics:  Pre Biometrics - 11/02/22 1732       Pre Biometrics   Height 5\' 2"  (1.575 m)    Weight 129 lb (58.5 kg)    Waist Circumference 33 inches    Hip Circumference 39 inches    Waist to Hip Ratio 0.85 %    BMI (Calculated) 23.59    Single Leg Stand 2.78 seconds              Nutrition Therapy Plan and Nutrition Goals:  Nutrition Therapy & Goals - 11/02/22 1735       Intervention Plan   Intervention Prescribe, educate and counsel regarding individualized specific dietary modifications aiming towards targeted core components such as weight, hypertension, lipid management, diabetes, heart failure and other comorbidities.    Expected Outcomes Short Term Goal: Understand basic principles of dietary content, such as calories, fat, sodium, cholesterol and nutrients.;Short Term Goal: A plan has been developed with personal nutrition goals set during dietitian appointment.;Long Term Goal: Adherence to prescribed nutrition plan.             Nutrition Assessments:  MEDIFICTS Score Key: ?70 Need to make dietary changes  40-70 Heart Healthy Diet ? 40 Therapeutic Level Cholesterol Diet  Flowsheet Row Pulmonary Rehab from  11/02/2022 in [facility] Cardiac and Pulmonary Rehab   Picture Your Plate Total Score on Admission 81      Picture Your Plate Scores: <16 Unhealthy dietary pattern with much room for improvement. 41-50 Dietary pattern unlikely to meet recommendations for good health and room for improvement. 51-60 More healthful dietary pattern, with some room for improvement.  >60 Healthy dietary pattern, although there may be some specific behaviors that could be improved.   Nutrition Goals Re-Evaluation:   Nutrition Goals Discharge (Final Nutrition Goals Re-Evaluation):   Psychosocial: Target Goals: Acknowledge presence or absence of significant depression and/or stress, maximize coping skills, provide positive support system. Participant is able to verbalize types and ability to use techniques and skills needed for reducing stress and depression.   Education: Stress, Anxiety, and Depression - Group verbal and visual presentation to define topics covered.  Reviews how body is impacted by stress, anxiety, and depression.  Also discusses healthy ways to reduce stress and to treat/manage anxiety and depression.  Written material given at graduation. Flowsheet Row Pulmonary Rehab from 12/25/2020 in [facility] Cardiac and Pulmonary Rehab  Date 10/16/20  Educator [facility]  Instruction Review Code 1- [employer] Understanding       Education: Sleep Hygiene -Provides group verbal and written instruction about how sleep can affect your health.  Define sleep hygiene, discuss sleep cycles and impact of sleep habits. Review good sleep hygiene tips.  Flowsheet Row Cardiac Rehab from 05/30/2018 in [facility] Cardiac and Pulmonary Rehab  Date 03/16/18  Educator [facility]  Instruction Review Code 1- Verbalizes Understanding       Initial Review & Psychosocial Screening:  Initial Psych Review & Screening - 10/28/22 1352       Initial Review   Current issues with Current Anxiety/Panic      Family Dynamics   Good Support System? Yes    Comments Trynity can look to her husband and  her two daughters for support. Her daughters call her almost everyday. She also has alot of faith that gets her through alot.      Barriers   Psychosocial barriers to participate in program The patient should benefit from training in stress management and relaxation.      Screening Interventions   Interventions To provide support and resources with identified psychosocial needs;Encouraged to exercise;Provide feedback about the scores to participant    Expected Outcomes Short Term goal: Utilizing psychosocial counselor, staff and physician to assist with identification of specific Stressors or current issues interfering with healing process. Setting desired goal for each stressor or current issue identified.;Long Term Goal: Stressors or current issues are controlled or eliminated.;Short Term goal: Identification and review with participant of any Quality of Life or Depression concerns found by scoring the questionnaire.;Long Term goal: The participant improves quality of Life and PHQ9 Scores as seen by post scores and/or verbalization of changes             Quality of Life Scores:  Scores of 19 and below usually indicate a poorer quality of life in these areas.  A difference of  2-3 points is a clinically meaningful difference.  A difference of 2-3 points in the total score of the Quality of Life Index has been associated with significant improvement in overall quality of life, self-image, physical symptoms, and general health in studies assessing change in quality of life.  PHQ-9: Review Flowsheet  More data may exist      11/02/2022 01/02/2021 09/02/2020 05/30/2018 03/08/2018  Depression screen PHQ 2/9  Decreased  Interest 0 0 0 0 1  Down, Depressed, Hopeless 0 0 0 0 0  PHQ - 2 Score 0 0 0 0 1  Altered sleeping 1 1 - - 0  Tired, decreased energy 1 1 1 1 1   Change in appetite - 0 1 0 0  Feeling bad or failure about yourself  0 0 0 0 0  Trouble concentrating 0 0 0 0 0  Moving slowly or  fidgety/restless 0 0 0 0 0  Suicidal thoughts 0 0 0 0 0  PHQ-9 Score 2 2 - - 2  Difficult doing work/chores - Not difficult at all Not difficult at all - Not difficult at all   Interpretation of Total Score  Total Score Depression Severity:  1-4 = Minimal depression, 5-9 = Mild depression, 10-14 = Moderate depression, 15-19 = Moderately severe depression, 20-27 = Severe depression   Psychosocial Evaluation and Intervention:  Psychosocial Evaluation - 10/28/22 1353       Psychosocial Evaluation & Interventions   Interventions Encouraged to exercise with the program and follow exercise prescription;Relaxation education;Stress management education    Comments Destony can look to her husband and her two daughters for support. Her daughters call her almost everyday. She also has alot of faith that gets her through alot.    Expected Outcomes Short: Start [name] to help with mood. Long: Maintain a healthy mental state.    Continue Psychosocial Services  Follow up required by staff             Psychosocial Re-Evaluation:   Psychosocial Discharge (Final Psychosocial Re-Evaluation):   Education: Education Goals: Education classes will be provided on a weekly basis, covering required topics. Participant will state understanding/return demonstration of topics presented.  Learning Barriers/Preferences:  Learning Barriers/Preferences - 10/28/22 1351       Learning Barriers/Preferences   Learning Barriers None    Learning Preferences None             General Pulmonary Education Topics:  Infection Prevention: - Provides verbal and written material to individual with discussion of infection control including proper hand washing and proper equipment cleaning during exercise session. Flowsheet Row Pulmonary Rehab from 11/02/2022 in [facility] Cardiac and Pulmonary Rehab  Date 11/02/22  Educator [facility]  Instruction Review Code 1- Verbalizes Understanding       Falls Prevention: -  Provides verbal and written material to individual with discussion of falls prevention and safety. Flowsheet Row Pulmonary Rehab from 11/02/2022 in [facility] Cardiac and Pulmonary Rehab  Date 11/02/22  Educator [facility]  Instruction Review Code 1- Verbalizes Understanding       Chronic Lung Disease Review: - Group verbal instruction with posters, models, PowerPoint presentations and videos,  to review new updates, new respiratory medications, new advancements in procedures and treatments. Providing information on websites and "800" numbers for continued self-education. Includes information about supplement oxygen, available portable oxygen systems, continuous and intermittent flow rates, oxygen safety, concentrators, and Medicare reimbursement for oxygen. Explanation of Pulmonary Drugs, including class, frequency, complications, importance of spacers, rinsing mouth after steroid MDI's, and proper cleaning methods for nebulizers. Review of basic lung anatomy and physiology related to function, structure, and complications of lung disease. Review of risk factors. Discussion about methods for diagnosing sleep apnea and types of masks and machines for OSA. Includes a review of the use of types of environmental controls: home humidity, furnaces, filters, dust mite/pet prevention, HEPA vacuums. Discussion about weather changes, air quality and the benefits of nasal washing. Instruction on  Warning signs, infection symptoms, calling MD promptly, preventive modes, and value of vaccinations. Review of effective airway clearance, coughing and/or vibration techniques. Emphasizing that all should Create an Action Plan. Written material given at graduation. Flowsheet Row Pulmonary Rehab from 11/02/2022 in [facility] Cardiac and Pulmonary Rehab  Education need identified 11/02/22       AED/CPR: - Group verbal and written instruction with the use of models to demonstrate the basic use of the AED with the basic ABC's of  resuscitation.    Anatomy and Cardiac Procedures: - Group verbal and visual presentation and models provide information about basic cardiac anatomy and function. Reviews the testing methods done to diagnose heart disease and the outcomes of the test results. Describes the treatment choices: Medical Management, Angioplasty, or Coronary Bypass Surgery for treating various heart conditions including Myocardial Infarction, Angina, Valve Disease, and Cardiac Arrhythmias.  Written material given at graduation. Flowsheet Row Pulmonary Rehab from 12/25/2020 in [facility] Cardiac and Pulmonary Rehab  Date 11/06/20  Educator [facility]  Instruction Review Code 1- Verbalizes Understanding       Medication Safety: - Group verbal and visual instruction to review commonly prescribed medications for heart and lung disease. Reviews the medication, class of the drug, and side effects. Includes the steps to properly store meds and maintain the prescription regimen.  Written material given at graduation.   Other: -Provides group and verbal instruction on various topics (see comments)   Knowledge Questionnaire Score:  Knowledge Questionnaire Score - 11/02/22 1733       Knowledge Questionnaire Score   Pre Score 17/18              Core Components/Risk Factors/Patient Goals at Admission:  Personal Goals and Risk Factors at Admission - 11/02/22 1735       Core Components/Risk Factors/Patient Goals on Admission    Weight Management Yes;Weight Maintenance    Intervention Weight Management: Develop a combined nutrition and exercise program designed to reach desired caloric intake, while maintaining appropriate intake of nutrient and fiber, sodium and fats, and appropriate energy expenditure required for the weight goal.;Weight Management: Provide education and appropriate resources to help participant work on and attain dietary goals.;Weight Management/Obesity: Establish reasonable short term and long term weight  goals.    Admit Weight 129 lb (58.5 kg)    Goal Weight: Short Term 129 lb (58.5 kg)    Goal Weight: Long Term 129 lb (58.5 kg)    Expected Outcomes Short Term: Continue to assess and modify interventions until short term weight is achieved;Long Term: Adherence to nutrition and physical activity/exercise program aimed toward attainment of established weight goal;Understanding recommendations for meals to include 15-35% energy as protein, 25-35% energy from fat, 35-60% energy from carbohydrates, less than 200mg  of dietary cholesterol, 20-35 gm of total fiber daily;Understanding of distribution of calorie intake throughout the day with the consumption of 4-5 meals/snacks;Weight Maintenance: Understanding of the daily nutrition guidelines, which includes 25-35% calories from fat, 7% or less cal from saturated fats, less than 200mg  cholesterol, less than 1.5gm of sodium, & 5 or more servings of fruits and vegetables daily    Improve shortness of breath with ADL's Yes    Intervention Provide education, individualized exercise plan and daily activity instruction to help decrease symptoms of SOB with activities of daily living.    Expected Outcomes Short Term: Improve cardiorespiratory fitness to achieve a reduction of symptoms when performing ADLs;Long Term: Be able to perform more ADLs without symptoms or delay the onset of symptoms  Heart Failure Yes    Intervention Provide a combined exercise and nutrition program that is supplemented with education, support and counseling about heart failure. Directed toward relieving symptoms such as shortness of breath, decreased exercise tolerance, and extremity edema.    Expected Outcomes Improve functional capacity of life;Short term: Attendance in program 2-3 days a week with increased exercise capacity. Reported lower sodium intake. Reported increased fruit and vegetable intake. Reports medication compliance.;Short term: Daily weights obtained and reported for  increase. Utilizing diuretic protocols set by physician.;Long term: Adoption of self-care skills and reduction of barriers for early signs and symptoms recognition and intervention leading to self-care maintenance.    Hypertension Yes    Intervention Provide education on lifestyle modifcations including regular physical activity/exercise, weight management, moderate sodium restriction and increased consumption of fresh fruit, vegetables, and low fat dairy, alcohol moderation, and smoking cessation.;Monitor prescription use compliance.    Expected Outcomes Short Term: Continued assessment and intervention until BP is < 140/66mm HG in hypertensive participants. < 130/21mm HG in hypertensive participants with diabetes, heart failure or chronic kidney disease.;Long Term: Maintenance of blood pressure at goal levels.    Lipids Yes    Intervention Provide education and support for participant on nutrition & aerobic/resistive exercise along with prescribed medications to achieve LDL 70mg , HDL >40mg .    Expected Outcomes Short Term: Participant states understanding of desired cholesterol values and is compliant with medications prescribed. Participant is following exercise prescription and nutrition guidelines.;Long Term: Cholesterol controlled with medications as prescribed, with individualized exercise RX and with personalized nutrition plan. Value goals: LDL < 70mg , HDL > 40 mg.             Education:Diabetes - Individual verbal and written instruction to review signs/symptoms of diabetes, desired ranges of glucose level fasting, after meals and with exercise. Acknowledge that pre and post exercise glucose checks will be done for 3 sessions at entry of program.   Know Your Numbers and Heart Failure: - Group verbal and visual instruction to discuss disease risk factors for cardiac and pulmonary disease and treatment options.  Reviews associated critical values for Overweight/Obesity, Hypertension,  Cholesterol, and Diabetes.  Discusses basics of heart failure: signs/symptoms and treatments.  Introduces Heart Failure Zone chart for action plan for heart failure.  Written material given at graduation. Flowsheet Row Pulmonary Rehab from 12/25/2020 in [facility] Cardiac and Pulmonary Rehab  Date 12/04/20  Educator [facility]  Instruction Review Code 1- Verbalizes Understanding       Core Components/Risk Factors/Patient Goals Review:    Core Components/Risk Factors/Patient Goals at Discharge (Final Review):    ITP Comments:  ITP Comments     Row Name 10/28/22 1348 11/02/22 1725         ITP Comments Virtual Visit completed. Patient informed on EP and RD appointment and 6 Minute walk test. Patient also informed of patient health questionnaires on My Chart. Patient Verbalizes understanding. Visit diagnosis can be found in [facility] 09/10/2022. Completed and gym orientation. Initial ITP created and sent for review to Dr. Jinny Sanders, Medical Director.               Comments: initial ITP

## 2022-11-02 NOTE — Patient Instructions (Signed)
Patient Instructions  Patient Details  Name: Joyce Evans MRN: [id number] Date of Birth: 1941/03/23 Referring Provider:  Kloefkorn, [place], MD  Below are your personal goals for exercise, nutrition, and risk factors. Our goal is to help you stay on track towards obtaining and maintaining these goals. We will be discussing your progress on these goals with you throughout the program.  Initial Exercise Prescription:  Initial Exercise Prescription - 11/02/22 1700       Date of Initial Exercise RX and Referring Provider   Date 11/02/22    Referring Provider Kloefkorn      Oxygen   Oxygen Continuous    Liters 4    Maintain Oxygen Saturation 88% or higher      Treadmill   MPH 1    Grade 0    Minutes 15    METs 1.8      REL-XR   Level 1    Speed 50    Minutes 15    METs 1.48      Biostep-RELP   Level 1    SPM 50    Minutes 15    METs 1.48      Track   Laps 10    Minutes 15    METs 1.54      Prescription Details   Frequency (times per week) 2    Duration Progress to 30 minutes of continuous aerobic without signs/symptoms of physical distress      Intensity   THRR 40-80% of Max Heartrate 97-125    Ratings of Perceived Exertion 11-13    Perceived Dyspnea 0-4      Progression   Progression Continue to progress workloads to maintain intensity without signs/symptoms of physical distress.      Resistance Training   Training Prescription Yes    Weight 2    Reps 10-15             Exercise Goals: Frequency: Be able to perform aerobic exercise two to three times per week in program working toward 2-5 days per week of home exercise.  Intensity: Work with a perceived exertion of 11 (fairly light) - 15 (hard) while following your exercise prescription.  We will make changes to your prescription with you as you progress through the program.   Duration: Be able to do 30 to 45 minutes of continuous aerobic exercise in addition to a 5 minute warm-up and a 5 minute  cool-down routine.   Nutrition Goals: Your personal nutrition goals will be established when you do your nutrition analysis with the dietician.  The following are general nutrition guidelines to follow: Cholesterol < 200mg /day Sodium < 1500mg /day Fiber: Women over 50 yrs - 21 grams per day  Personal Goals:  Personal Goals and Risk Factors at Admission - 11/02/22 1735       Core Components/Risk Factors/Patient Goals on Admission    Weight Management Yes;Weight Maintenance    Intervention Weight Management: Develop a combined nutrition and exercise program designed to reach desired caloric intake, while maintaining appropriate intake of nutrient and fiber, sodium and fats, and appropriate energy expenditure required for the weight goal.;Weight Management: Provide education and appropriate resources to help participant work on and attain dietary goals.;Weight Management/Obesity: Establish reasonable short term and long term weight goals.    Admit Weight 129 lb (58.5 kg)    Goal Weight: Short Term 129 lb (58.5 kg)    Goal Weight: Long Term 129 lb (58.5 kg)    Expected Outcomes Short Term:  Continue to assess and modify interventions until short term weight is achieved;Long Term: Adherence to nutrition and physical activity/exercise program aimed toward attainment of established weight goal;Understanding recommendations for meals to include 15-35% energy as protein, 25-35% energy from fat, 35-60% energy from carbohydrates, less than  of dietary cholesterol, 20-35 gm of total fiber daily;Understanding of distribution of calorie intake throughout the day with the consumption of 4-5 meals/snacks;Weight Maintenance: Understanding of the daily nutrition guidelines, which includes 25-35% calories from fat, 7% or less cal from saturated fats, less than  cholesterol, less than 1.5gm of sodium, & 5 or more servings of fruits and vegetables daily    Improve shortness of breath with ADL's Yes     Intervention Provide education, individualized exercise plan and daily activity instruction to help decrease symptoms of SOB with activities of daily living.    Expected Outcomes Short Term: Improve cardiorespiratory fitness to achieve a reduction of symptoms when performing ADLs;Long Term: Be able to perform more ADLs without symptoms or delay the onset of symptoms    Heart Failure Yes    Intervention Provide a combined exercise and nutrition program that is supplemented with education, support and counseling about heart failure. Directed toward relieving symptoms such as shortness of breath, decreased exercise tolerance, and extremity edema.    Expected Outcomes Improve functional capacity of life;Short term: Attendance in program 2-3 days a week with increased exercise capacity. Reported lower sodium intake. Reported increased fruit and vegetable intake. Reports medication compliance.;Short term: Daily weights obtained and reported for increase. Utilizing diuretic protocols set by physician.;Long term: Adoption of self-care skills and reduction of barriers for early signs and symptoms recognition and intervention leading to self-care maintenance.    Hypertension Yes    Intervention Provide education on lifestyle modifcations including regular physical activity/exercise, weight management, moderate sodium restriction and increased consumption of fresh fruit, vegetables, and low fat dairy, alcohol moderation, and smoking cessation.;Monitor prescription use compliance.    Expected Outcomes Short Term: Continued assessment and intervention until BP is < 140/55mm HG in hypertensive participants. < 130/45mm HG in hypertensive participants with diabetes, heart failure or chronic kidney disease.;Long Term: Maintenance of blood pressure at goal levels.    Lipids Yes    Intervention Provide education and support for participant on nutrition & aerobic/resistive exercise along with prescribed medications to achieve  LDL 70mg , HDL >40mg .    Expected Outcomes Short Term: Participant states understanding of desired cholesterol values and is compliant with medications prescribed. Participant is following exercise prescription and nutrition guidelines.;Long Term: Cholesterol controlled with medications as prescribed, with individualized exercise RX and with personalized nutrition plan. Value goals: LDL < , HDL > 40 mg.             Tobacco Use Initial Evaluation: Social History   Tobacco Use  Smoking Status Never  Smokeless Tobacco Never    Exercise Goals and Review:  Exercise Goals     Row Name 11/02/22 1731             Exercise Goals   Increase Physical Activity Yes       Intervention Provide advice, education, support and counseling about physical activity/exercise needs.;Develop an individualized exercise prescription for aerobic and resistive training based on initial evaluation findings, risk stratification, comorbidities and participant's personal goals.       Expected Outcomes Short Term: Attend rehab on a regular basis to increase amount of physical activity.;Long Term: Add in home exercise to make exercise part of routine and to  increase amount of physical activity.;Long Term: Exercising regularly at least 3-5 days a week.       Increase Strength and Stamina Yes       Intervention Provide advice, education, support and counseling about physical activity/exercise needs.;Develop an individualized exercise prescription for aerobic and resistive training based on initial evaluation findings, risk stratification, comorbidities and participant's personal goals.       Expected Outcomes Short Term: Increase workloads from initial exercise prescription for resistance, speed, and METs.;Short Term: Perform resistance training exercises routinely during rehab and add in resistance training at home;Long Term: Improve cardiorespiratory fitness, muscular endurance and strength as measured by increased  METs and functional capacity ( )       Able to understand and use rate of perceived exertion (RPE) scale Yes       Intervention Provide education and explanation on how to use RPE scale       Expected Outcomes Short Term: Able to use RPE daily in rehab to express subjective intensity level;Long Term:  Able to use RPE to guide intensity level when exercising independently       Able to understand and use Dyspnea scale Yes       Intervention Provide education and explanation on how to use Dyspnea scale       Expected Outcomes Short Term: Able to use Dyspnea scale daily in rehab to express subjective sense of shortness of breath during exertion;Long Term: Able to use Dyspnea scale to guide intensity level when exercising independently       Knowledge and understanding of Target Heart Rate Range (THRR) Yes       Intervention Provide education and explanation of THRR including how the numbers were predicted and where they are located for reference       Expected Outcomes Short Term: Able to state/look up THRR;Long Term: Able to use THRR to govern intensity when exercising independently;Short Term: Able to use daily as guideline for intensity in rehab       Able to check pulse independently Yes       Intervention Provide education and demonstration on how to check pulse in carotid and radial arteries.;Review the importance of being able to check your own pulse for safety during independent exercise       Expected Outcomes Short Term: Able to explain why pulse checking is important during independent exercise       Understanding of Exercise Prescription Yes       Intervention Provide education, explanation, and written materials on patient's individual exercise prescription       Expected Outcomes Short Term: Able to explain program exercise prescription;Long Term: Able to explain home exercise prescription to exercise independently                Copy of goals given to participant.

## 2022-11-04 ENCOUNTER — Encounter: Payer: Medicare PPO | Admitting: *Deleted

## 2022-11-04 DIAGNOSIS — Z713 Dietary counseling and surveillance: Secondary | ICD-10-CM | POA: Diagnosis not present

## 2022-11-04 DIAGNOSIS — J849 Interstitial pulmonary disease, unspecified: Secondary | ICD-10-CM

## 2022-11-04 NOTE — Progress Notes (Signed)
Daily Session Note  Patient Details  Name: Joyce Evans MRN: [id number] Date of Birth: [birth year] Referring Provider:   Flowsheet Row Pulmonary Rehab from 11/02/2022 in [facility] Cardiac and Pulmonary Rehab  Referring Provider Kloefkorn       Encounter Date: 11/04/2022  Check In:  Session Check In - 11/04/22 1410       Check-In   Supervising physician immediately available to respond to emergencies See telemetry face sheet for immediately available ER MD    Location [facility]-Cardiac & Pulmonary Rehab    Staff Present Susann Givens, RN BSN;Joseph Sturgeon, RCP,RRT,BSRT;Kelly Vail, [place], ACSM CEP, Exercise Physiologist    Virtual Visit No    Medication changes reported     No    Fall or balance concerns reported    No    Warm-up and Cool-down Performed on first and last piece of equipment    Resistance Training Performed Yes    VAD Patient? No    PAD/SET Patient? No      Pain Assessment   Currently in Pain? No/denies                Social History   Tobacco Use  Smoking Status Never  Smokeless Tobacco Never    Goals Met:  Independence with exercise equipment Exercise tolerated well No report of concerns or symptoms today Strength training completed today  Goals Unmet:  Not Applicable  Comments: First full day of exercise!  Patient was oriented to gym and equipment including functions, settings, policies, and procedures.  Patient's individual exercise prescription and treatment plan were reviewed.  All starting workloads were established based on the results of the 6 minute walk test done at initial orientation visit.  The plan for exercise progression was also introduced and progression will be customized based on patient's performance and goals.    Dr. Bethann Punches is Medical Director for [facility] Cardiac Rehabilitation.  Dr. Vida Rigger is Medical Director for [facility] Pulmonary Rehabilitation.

## 2022-11-09 ENCOUNTER — Encounter: Payer: Medicare PPO | Admitting: *Deleted

## 2022-11-09 DIAGNOSIS — Z713 Dietary counseling and surveillance: Secondary | ICD-10-CM | POA: Diagnosis not present

## 2022-11-09 DIAGNOSIS — J849 Interstitial pulmonary disease, unspecified: Secondary | ICD-10-CM

## 2022-11-09 NOTE — Progress Notes (Signed)
Daily Session Note  Patient Details  Name: Joyce Evans MRN: [id number] Date of Birth: 22-Nov-1940 Referring Provider:   Flowsheet Row Pulmonary Rehab from 11/02/2022 in [facility] Cardiac and Pulmonary Rehab  Referring Provider Kloefkorn       Encounter Date: 11/09/2022  Check In:  Session Check In - 11/09/22 1543       Check-In   Supervising physician immediately available to respond to emergencies See telemetry face sheet for immediately available ER MD    Location [facility]-Cardiac & Pulmonary Rehab    Staff Present Susann Givens, RN BSN;Joseph Reino Kent, RCP,RRT,BSRT;Noah [place], [place], Exercise Physiologist    Virtual Visit No    Medication changes reported     No    Fall or balance concerns reported    No    Warm-up and Cool-down Performed on first and last piece of equipment    Resistance Training Performed Yes    VAD Patient? No    PAD/SET Patient? No      Pain Assessment   Currently in Pain? No/denies                Social History   Tobacco Use  Smoking Status Never  Smokeless Tobacco Never    Goals Met:  Independence with exercise equipment Exercise tolerated well No report of concerns or symptoms today Strength training completed today  Goals Unmet:  Not Applicable  Comments: Pt able to follow exercise prescription today without complaint.  Will continue to monitor for progression.    Dr. Bethann Punches is Medical Director for [facility] Cardiac Rehabilitation.  Dr. Vida Rigger is Medical Director for [facility] Pulmonary Rehabilitation.

## 2022-11-11 ENCOUNTER — Encounter: Payer: Medicare PPO | Attending: Internal Medicine | Admitting: *Deleted

## 2022-11-11 DIAGNOSIS — J849 Interstitial pulmonary disease, unspecified: Secondary | ICD-10-CM | POA: Insufficient documentation

## 2022-11-11 NOTE — Progress Notes (Signed)
Daily Session Note  Patient Details  Name: Joyce Evans MRN: [id number] Date of Birth: [birth year] Referring Provider:   Flowsheet Row Pulmonary Rehab from 11/02/2022 in [facility] Cardiac and Pulmonary Rehab  Referring Provider Kloefkorn       Encounter Date: 11/11/2022  Check In:  Session Check In - 11/11/22 1554       Check-In   Supervising physician immediately available to respond to emergencies See telemetry face sheet for immediately available ER MD    Location [facility]-Cardiac & Pulmonary Rehab    Staff Present Susann Givens, RN Mabeline Caras, BS, ACSM CEP, Exercise Physiologist;Laureen Manson Passey, BS, RRT, CPFT    Virtual Visit No    Medication changes reported     No    Fall or balance concerns reported    No    Warm-up and Cool-down Performed on first and last piece of equipment    Resistance Training Performed Yes    VAD Patient? No    PAD/SET Patient? No      Pain Assessment   Currently in Pain? No/denies                Social History   Tobacco Use  Smoking Status Never  Smokeless Tobacco Never    Goals Met:  Independence with exercise equipment Exercise tolerated well No report of concerns or symptoms today Strength training completed today  Goals Unmet:  Not Applicable  Comments: Pt able to follow exercise prescription today without complaint.  Will continue to monitor for progression.    Dr. Bethann Punches is Medical Director for [facility] Cardiac Rehabilitation.  Dr. Vida Rigger is Medical Director for [facility] Pulmonary Rehabilitation.

## 2022-11-23 ENCOUNTER — Encounter: Payer: Medicare PPO | Admitting: *Deleted

## 2022-11-23 DIAGNOSIS — J849 Interstitial pulmonary disease, unspecified: Secondary | ICD-10-CM | POA: Diagnosis not present

## 2022-11-23 NOTE — Progress Notes (Signed)
Daily Session Note  Patient Details  Name: Joyce Evans MRN: [id number] Date of Birth: 1941/04/17 Referring Provider:   Flowsheet Row Pulmonary Rehab from 11/02/2022 in [facility] Cardiac and Pulmonary Rehab  Referring Provider Kloefkorn       Encounter Date: 11/23/2022  Check In:  Session Check In - 11/23/22 1544       Check-In   Supervising physician immediately available to respond to emergencies See telemetry face sheet for immediately available ER MD    Location [facility]-Cardiac & Pulmonary Rehab    Staff Present Susann Givens, RN BSN;Noah Tickle, BS, Exercise Physiologist;Joseph Reino [place], [place]    Virtual Visit No    Medication changes reported     No    Fall or balance concerns reported    No    Warm-up and Cool-down Performed on first and last piece of equipment    Resistance Training Performed Yes    VAD Patient? No    PAD/SET Patient? No      Pain Assessment   Currently in Pain? No/denies                Social History   Tobacco Use  Smoking Status Never  Smokeless Tobacco Never    Goals Met:  Independence with exercise equipment Exercise tolerated well No report of concerns or symptoms today Strength training completed today  Goals Unmet:  Not Applicable  Comments: Pt able to follow exercise prescription today without complaint.  Will continue to monitor for progression.  Reviewed home exercise with pt today.  Pt plans to walk at home for exercise.  Reviewed THR, pulse, RPE, sign and symptoms, pulse oximetery and when to call 911 or MD.  Also discussed weather considerations and indoor options.  Pt voiced understanding. Also reviewed goals with patient.  Service time [id number]   Dr. Bethann Punches is Medical Director for [facility] Cardiac Rehabilitation.  Dr. Vida Rigger is Medical Director for [facility] Pulmonary Rehabilitation.

## 2022-11-25 ENCOUNTER — Encounter: Payer: Self-pay | Admitting: *Deleted

## 2022-11-25 ENCOUNTER — Encounter: Payer: Medicare PPO | Admitting: *Deleted

## 2022-11-25 DIAGNOSIS — J849 Interstitial pulmonary disease, unspecified: Secondary | ICD-10-CM

## 2022-11-25 NOTE — Progress Notes (Signed)
Daily Session Note  Patient Details  Name: JOLEAN PROSEN MRN: [id number] Date of Birth: July 13, 1941 Referring Provider:   Flowsheet Row Pulmonary Rehab from 11/02/2022 in [facility] Cardiac and Pulmonary Rehab  Referring Provider Kloefkorn       Encounter Date: 11/25/2022  Check In:  Session Check In - 11/25/22 1554       Check-In   Supervising physician immediately available to respond to emergencies See telemetry face sheet for immediately available ER MD    Location [facility]-Cardiac & Pulmonary Rehab    Staff Present Susann Givens, RN BSN;Laureen Manson Passey, BS, RRT, CPFT;Megan Katrinka Blazing, RN, ADN    Virtual Visit No    Medication changes reported     No    Fall or balance concerns reported    No    Warm-up and Cool-down Performed on first and last piece of equipment    Resistance Training Performed Yes    VAD Patient? No    PAD/SET Patient? No      Pain Assessment   Currently in Pain? No/denies                Social History   Tobacco Use  Smoking Status Never  Smokeless Tobacco Never    Goals Met:  Independence with exercise equipment Exercise tolerated well No report of concerns or symptoms today Strength training completed today  Goals Unmet:  Not Applicable  Comments: Pt able to follow exercise prescription today without complaint.  Will continue to monitor for progression.    Dr. Bethann Punches is Medical Director for [facility] Cardiac Rehabilitation.  Dr. Vida Rigger is Medical Director for [facility] Pulmonary Rehabilitation.

## 2022-11-25 NOTE — Progress Notes (Signed)
Pulmonary Individual Treatment Plan  Patient Details  Name: Joyce Evans MRN: [id number] Date of Birth: 24-Sep-1940 Referring Provider:   Flowsheet Row Pulmonary Rehab from 11/02/2022 in [facility] Cardiac and Pulmonary Rehab  Referring Provider Kloefkorn       Initial Encounter Date:  Flowsheet Row Pulmonary Rehab from 11/02/2022 in [facility] Cardiac and Pulmonary Rehab  Date 11/02/22       Visit Diagnosis: ILD (interstitial lung disease) (HCC)  Patient's Home Medications on Admission:  Current Outpatient Medications:    acetaminophen (TYLENOL) 500 MG tablet, Take 500 mg by mouth every 6 (six) hours as needed., Disp: , Rfl:    amLODipine (NORVASC) 5 MG tablet, Take 5 mg by mouth daily. (Patient not taking: Reported on 10/16/2021), Disp: , Rfl:    apixaban (ELIQUIS) 5 MG TABS tablet, Take 5 mg by mouth 2 (two) times daily.  (Patient not taking: Reported on 10/28/2022), Disp: , Rfl:    apixaban (ELIQUIS) 5 MG TABS tablet, Take 1 tablet by mouth 2 (two) times daily., Disp: , Rfl:    Apple Cider Vinegar 300 MG TABS, Take 1 tablet by mouth daily., Disp: , Rfl:    atorvastatin (LIPITOR) 40 MG tablet, Take by mouth. (Patient not taking: Reported on 10/28/2022), Disp: , Rfl:    atorvastatin (LIPITOR) 40 MG tablet, Take 1 tablet by mouth daily., Disp: , Rfl:    brimonidine (ALPHAGAN P) 0.1 % SOLN, Place 1 drop into the right eye 2 (two) times daily., Disp: , Rfl:    brimonidine (ALPHAGAN) 0.2 % ophthalmic solution, SMARTSIG:In Eye(s) (Patient not taking: Reported on 10/28/2022), Disp: , Rfl:    Brinzolamide-Brimonidine 1-0.2 % SUSP, Place 1 drop into the left eye 2 (two) times daily., Disp: , Rfl:    carvedilol (COREG) 6.25 MG tablet, Take 1 tablet (6.25 mg total) by mouth 2 (two) times daily with a meal. (Patient not taking: Reported on 10/28/2022), Disp: , Rfl:    cetirizine (ZYRTEC) 10 MG tablet, Take 10 mg by mouth daily., Disp: , Rfl:    Cholecalciferol (VITAMIN D3) 50 MCG (2000 UT) capsule, Take by  mouth., Disp: , Rfl:    Cholecalciferol 50 MCG (2000 UT) CAPS, Take 3 capsules by mouth in the morning. (Patient not taking: Reported on 10/28/2022), Disp: , Rfl:    citalopram (CELEXA) 20 MG tablet, Take 10 mg by mouth daily. (Patient not taking: Reported on 10/28/2022), Disp: , Rfl:    citalopram (CELEXA) 20 MG tablet, Take by mouth., Disp: , Rfl:    famotidine (PEPCID) 40 MG tablet, Take 40 mg by mouth at bedtime., Disp: , Rfl:    fluorouracil (EFUDEX) 5 % cream, Apply topically 2 (two) times daily. On August 15 start cream to nose bid for 7 days (Patient not taking: Reported on 10/28/2022), Disp: 15 g, Rfl: 0   fluticasone (FLONASE) 50 MCG/ACT nasal spray, Place 2 sprays into the nose daily., Disp: , Rfl:    fluticasone (FLONASE) 50 MCG/ACT nasal spray, Place into the nose., Disp: , Rfl:    furosemide (LASIX) 20 MG tablet, Take 20 mg by mouth daily., Disp: , Rfl:    furosemide (LASIX) 20 MG tablet, TAKE 1 TABLET ONE TIME DAILY, Disp: , Rfl:    gabapentin (NEURONTIN) 300 MG capsule, Take 1 capsule (300 mg total) by mouth 2 (two) times daily for 14 days., Disp: 28 capsule, Rfl: 0   losartan (COZAAR) 50 MG tablet, Take 1 tablet by mouth daily. (Patient not taking: Reported on 10/16/2021), Disp: ,  Rfl:    magnesium gluconate 54mg /51ml syringe, Take by mouth., Disp: , Rfl:    methocarbamol (ROBAXIN) 500 MG tablet, Take 1 tablet (500 mg total) by mouth every 8 (eight) hours as needed for muscle spasms., Disp: , Rfl:    multivitamin-iron-minerals-folic acid (CENTRUM) chewable tablet, Chew 1 tablet by mouth daily., Disp: , Rfl:    omeprazole (PRILOSEC) 40 MG capsule, Take by mouth., Disp: , Rfl:    pantoprazole (PROTONIX) 40 MG tablet, Take 40 mg by mouth daily., Disp: , Rfl:    predniSONE (DELTASONE) 1 MG tablet, Take 1 tablet by mouth See admin instructions. Take 9 tablets (9 mg total) by mouth once daily for 14 days, THEN 8 tablets (8 mg total) once daily for 14 days, THEN 7 tablets (7 mg total) once daily  for 14 days, THEN 6 tablets (6 mg total) once daily for 14 days, THEN 5 tablets (5 mg total) once daily for 14 days, THEN 4 tablets (4 mg total) once daily for 14 days, THEN 3 tablets (3 mg total) once daily for 14 days, THEN 2 tablets (2 mg total) once daily for 14 days, THEN 1 tablet (1 mg total) once daily for 14 days., Disp: , Rfl:    Probiotic Product (PROBIOTIC DAILY PO), Take by mouth daily. (Patient not taking: Reported on 10/28/2022), Disp: , Rfl:    senna-docusate (SENOKOT-S) 8.6-50 MG tablet, Take 1 tablet by mouth 2 (two) times daily., Disp: , Rfl:    timolol (BETIMOL) 0.5 % ophthalmic solution, Place 1 drop into both eyes daily., Disp: , Rfl:    White Petrolatum-Mineral Oil (SYSTANE NIGHTTIME) OINT, Apply 1 application. to eye at bedtime., Disp: , Rfl:    ZINC CITRATE PO, Take 1 tablet by mouth 2 (two) times daily. 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 Excised: 05/19/2016, margins free.   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   Tricuspid valve disorder    leak    Tobacco Use: Social History   Tobacco Use  Smoking Status Never  Smokeless Tobacco Never    Labs: Review Flowsheet       Latest Ref Rng & Units 06/17/2020 06/18/2020  Labs for ITP Cardiac  and Pulmonary Rehab  Cholestrol 0 - 200 mg/dL - 409   LDL (calc) 0 - 99 mg/dL - 811   Direct LDL 0 - 99 mg/dL 914.7  -  HDL-C >82 mg/dL - 51   Trlycerides <956 mg/dL - 57   Hemoglobin O1H 4.8 - 5.6 % 5.4  -     Pulmonary Assessment Scores:  Pulmonary Assessment Scores     Row Name 11/02/22 1737         ADL UCSD   ADL Phase Entry     SOB Score total 67     Rest 2     Walk 3     Stairs 4     Bath 2     Dress 2     Shop 3       CAT Score   CAT Score 23       mMRC Score   mMRC Score 2              UCSD: Self-administered rating of dyspnea associated with activities of daily living (ADLs) 6-point scale (0 = "not at all" to 5 = "maximal or unable to do because of breathlessness")  Scoring Scores range from 0 to 120.  Minimally important difference is 5 units  CAT: CAT can identify the health impairment of COPD patients and is better correlated with disease progression.  CAT has a scoring range of zero to 40. The CAT score is classified into four groups of low (less than 10), medium (10 - 20), high (21-30) and very high (31-40) based on the impact level of disease on health status. A CAT score over 10 suggests significant symptoms.  A worsening CAT score could be explained by an exacerbation, poor medication adherence, poor inhaler technique, or progression of COPD or comorbid conditions.  CAT MCID is 2 points  mMRC: mMRC (Modified Medical Research Council) Dyspnea Scale is used to assess the degree of baseline functional disability in patients of respiratory disease due to dyspnea. No minimal important difference is established. A decrease in score of 1 point or greater is considered a positive change.   Pulmonary Function Assessment:  Pulmonary Function Assessment - 10/28/22 1350       Breath   Shortness of Breath Yes;Limiting activity             Exercise Target Goals: Exercise Program Goal: Individual exercise prescription set using results from initial  6 min walk test and THRR while considering  patient's activity barriers and safety.   Exercise Prescription Goal: Initial exercise prescription builds to 30-45 minutes a day of aerobic activity, 2-3 days per week.  Home exercise guidelines will be given to patient during program as part of exercise prescription that the participant will acknowledge.  Education: Aerobic Exercise: - Group verbal and visual presentation on the components of exercise prescription. Introduces F.I.T.T principle from ACSM for exercise prescriptions.  Reviews F.I.T.T. principles of aerobic exercise including progression. Written material given at graduation.   Education: Resistance Exercise: - Group verbal and visual presentation on the components of exercise prescription. Introduces F.I.T.T principle from ACSM for exercise prescriptions  Reviews F.I.T.T. principles of resistance exercise including progression. Written material given at graduation. Flowsheet Row Pulmonary Rehab from 12/25/2020 in [facility] Cardiac and Pulmonary Rehab  Date 11/06/20  Educator AS  Instruction Review Code 1- Verbalizes Understanding        Education: Exercise & Equipment Safety: - Individual verbal instruction and demonstration of equipment use and safety with use of the equipment. Flowsheet Row Pulmonary Rehab from 11/02/2022 in [facility] Cardiac and Pulmonary Rehab  Date 11/02/22  Educator [facility]  Instruction Review Code 1- Verbalizes Understanding       Education: Exercise Physiology & General Exercise Guidelines: - Group verbal and written instruction with models to review the exercise physiology of the cardiovascular system and associated critical values. Provides general  exercise guidelines with specific guidelines to those with heart or lung disease.  Flowsheet Row Pulmonary Rehab from 12/25/2020 in [facility] Cardiac and Pulmonary Rehab  Date 10/23/20  Educator AS  Instruction Review Code 1- Verbalizes Understanding       Education:  Flexibility, Balance, Mind/Body Relaxation: - Group verbal and visual presentation with interactive activity on the components of exercise prescription. Introduces F.I.T.T principle from ACSM for exercise prescriptions. Reviews F.I.T.T. principles of flexibility and balance exercise training including progression. Also discusses the mind body connection.  Reviews various relaxation techniques to help reduce and manage stress (i.e. Deep breathing, progressive muscle relaxation, and visualization). Balance handout provided to take home. Written material given at graduation. Flowsheet Row Pulmonary Rehab from 12/25/2020 in [facility] Cardiac and Pulmonary Rehab  Date 09/11/20  Educator AS  Instruction Review Code 1- Verbalizes Understanding       Activity Barriers & Risk Stratification:  Activity Barriers & Cardiac Risk Stratification - 11/02/22 1728       Activity Barriers & Cardiac Risk Stratification   Activity Barriers Deconditioning;Muscular Weakness;Shortness of Breath;History of Falls;Balance Concerns;Joint Problems             6 Minute Walk:  6 Minute Walk     Row Name 11/02/22 1726         6 Minute Walk   Phase Initial     Distance 750 feet     Walk Time 6 minutes     # of Rest Breaks 0     MPH 1.42     METS 1.48     RPE 13     Perceived Dyspnea  2     VO2 Peak 5.16     Symptoms No     Resting HR 69 bpm     Resting BP 122/72     Resting Oxygen Saturation  96 %     Exercise Oxygen Saturation  during 6 min walk 85 %     Max Ex. HR 104 bpm     Max Ex. BP 124/72     2 Minute Post BP 114/62       Interval HR   1 Minute HR 89     2 Minute HR 88     3 Minute HR 97     4 Minute HR 99     5 Minute HR 102     6 Minute HR 104     2 Minute Post HR 60     Interval Heart Rate? Yes       Interval Oxygen   Interval Oxygen? Yes     Baseline Oxygen Saturation % 96 %     1 Minute Oxygen Saturation % 92 %     1 Minute Liters of Oxygen 4 L     2 Minute Oxygen Saturation % 87  %     2 Minute Liters of Oxygen 4 L     3 Minute Oxygen Saturation % 87 %     3 Minute Liters of Oxygen 4 L     4 Minute Oxygen Saturation % 85 %     4 Minute Liters of Oxygen 4 L     5 Minute Oxygen Saturation % 85 %     5 Minute Liters of Oxygen 4 L     6 Minute Oxygen Saturation % 86 %     6 Minute Liters of Oxygen 4 L     2 Minute Post Oxygen Saturation % 91 %  2 Minute Post Liters of Oxygen 4 L             Oxygen Initial Assessment:  Oxygen Initial Assessment - 11/02/22 1736       Home Oxygen   Home Oxygen Device Home Concentrator;Portable Concentrator;E-Tanks    Sleep Oxygen Prescription Continuous    Liters per minute 3    Liters per minute 3    Home Resting Oxygen Prescription Continuous    Liters per minute 2    Compliance with Home Oxygen Use Yes      Initial 6 min Walk   Oxygen Used Continuous;Portable Concentrator    Liters per minute 4      Program Oxygen Prescription   Program Oxygen Prescription Continuous    Liters per minute 3      Intervention   Short Term Goals To learn and exhibit compliance with exercise, home and travel O2 prescription;To learn and understand importance of maintaining oxygen saturations>88%;To learn and demonstrate proper use of respiratory medications;To learn and understand importance of monitoring SPO2 with pulse oximeter and demonstrate accurate use of the pulse oximeter.;To learn and demonstrate proper pursed lip breathing techniques or other breathing techniques.     Long  Term Goals Verbalizes importance of monitoring SPO2 with pulse oximeter and return demonstration;Exhibits proper breathing techniques, such as pursed lip breathing or other method taught during program session;Demonstrates proper use of MDI's;Compliance with respiratory medication;Maintenance of O2 saturations>88%;Exhibits compliance with exercise, home  and travel O2 prescription             Oxygen Re-Evaluation:  Oxygen Re-Evaluation     Row Name  11/04/22 1413 11/23/22 1558           Program Oxygen Prescription   Program Oxygen Prescription -- Continuous      Liters per minute -- 4        Home Oxygen   Home Oxygen Device -- Home Concentrator;Portable Concentrator;E-Tanks      Sleep Oxygen Prescription -- Continuous      Liters per minute -- 4      Home Exercise Oxygen Prescription -- Continuous      Liters per minute -- 4      Home Resting Oxygen Prescription -- Continuous      Liters per minute -- 2  on occasion will take off during rest      Compliance with Home Oxygen Use -- Yes        Goals/Expected Outcomes   Short Term Goals -- To learn and exhibit compliance with exercise, home and travel O2 prescription;To learn and understand importance of maintaining oxygen saturations>88%;To learn and demonstrate proper use of respiratory medications;To learn and understand importance of monitoring SPO2 with pulse oximeter and demonstrate accurate use of the pulse oximeter.;To learn and demonstrate proper pursed lip breathing techniques or other breathing techniques.       Long  Term Goals -- Verbalizes importance of monitoring SPO2 with pulse oximeter and return demonstration;Exhibits proper breathing techniques, such as pursed lip breathing or other method taught during program session;Demonstrates proper use of MDI's;Compliance with respiratory medication;Maintenance of O2 saturations>88%;Exhibits compliance with exercise, home  and travel O2 prescription      Comments Reviewed PLB technique with pt.  Talked about how it works and it's importance in maintaining their exercise saturations. Reya is doing well in rehab. She is compliant with her oxygen and will use 4L with acitvity and 3-4 L at night.   During rest she will occassionaly take it  off completely and still be able to maintain her saturations.  She is good about using her PLB to maintain her saturations when she notices it dropping.  She does several breathing exercises as night  to help her breathing.  She does note that she has been coughing more and her doctor did start her on Mucinex to help clear muscus and cough.      Goals/Expected Outcomes Short: Become more profiecient at using PLB.   Long: Become independent at using PLB. Short: Continue to monitor saturations and cough Long; Continue to use PLB               Oxygen Discharge (Final Oxygen Re-Evaluation):  Oxygen Re-Evaluation - 11/23/22 1558       Program Oxygen Prescription   Program Oxygen Prescription Continuous    Liters per minute 4      Home Oxygen   Home Oxygen Device Home Concentrator;Portable Concentrator;E-Tanks    Sleep Oxygen Prescription Continuous    Liters per minute 4    Home Exercise Oxygen Prescription Continuous    Liters per minute 4    Home Resting Oxygen Prescription Continuous    Liters per minute 2   on occasion will take off during rest   Compliance with Home Oxygen Use Yes      Goals/Expected Outcomes   Short Term Goals To learn and exhibit compliance with exercise, home and travel O2 prescription;To learn and understand importance of maintaining oxygen saturations>88%;To learn and demonstrate proper use of respiratory medications;To learn and understand importance of monitoring SPO2 with pulse oximeter and demonstrate accurate use of the pulse oximeter.;To learn and demonstrate proper pursed lip breathing techniques or other breathing techniques.     Long  Term Goals Verbalizes importance of monitoring SPO2 with pulse oximeter and return demonstration;Exhibits proper breathing techniques, such as pursed lip breathing or other method taught during program session;Demonstrates proper use of MDI's;Compliance with respiratory medication;Maintenance of O2 saturations>88%;Exhibits compliance with exercise, home  and travel O2 prescription    Comments Leafie is doing well in rehab. She is compliant with her oxygen and will use 4L with acitvity and 3-4 L at night.   During rest she  will occassionaly take it off completely and still be able to maintain her saturations.  She is good about using her PLB to maintain her saturations when she notices it dropping.  She does several breathing exercises as night to help her breathing.  She does note that she has been coughing more and her doctor did start her on Mucinex to help clear muscus and cough.    Goals/Expected Outcomes Short: Continue to monitor saturations and cough Long; Continue to use PLB             Initial Exercise Prescription:  Initial Exercise Prescription - 11/02/22 1700       Date of Initial Exercise RX and Referring Provider   Date 11/02/22    Referring Provider Kloefkorn      Oxygen   Oxygen Continuous    Liters 4    Maintain Oxygen Saturation 88% or higher      Treadmill   MPH 1    Grade 0    Minutes 15    METs 1.8      REL-XR   Level 1    Speed 50    Minutes 15    METs 1.48      Biostep-RELP   Level 1    SPM 50    Minutes  15    METs 1.48      Track   Laps 10    Minutes 15    METs 1.54      Prescription Details   Frequency (times per week) 2    Duration Progress to 30 minutes of continuous aerobic without signs/symptoms of physical distress      Intensity   THRR 40-80% of Max Heartrate 97-125    Ratings of Perceived Exertion 11-13    Perceived Dyspnea 0-4      Progression   Progression Continue to progress workloads to maintain intensity without signs/symptoms of physical distress.      Resistance Training   Training Prescription Yes    Weight 2    Reps 10-15             Perform Capillary Blood Glucose checks as needed.  Exercise Prescription Changes:   Exercise Prescription Changes     Row Name 11/02/22 1700 11/16/22 1400 11/23/22 1600         Response to Exercise   Blood Pressure (Admit) 122/72 130/70 --     Blood Pressure (Exercise) 124/72 136/58 --     Blood Pressure (Exit) 114/62 124/72 --     Heart Rate (Admit) 69 bpm 67 bpm --     Heart Rate  (Exercise) 104 bpm 105 bpm --     Heart Rate (Exit) 72 bpm 78 bpm --     Oxygen Saturation (Admit) 96 % 98 % --     Oxygen Saturation (Exercise) 85 % 88 % --     Oxygen Saturation (Exit) 97 % 96 % --     Rating of Perceived Exertion (Exercise) 13 13 --     Perceived Dyspnea (Exercise) 2 1 --     Symptoms none SOB --     Comments 6 MWT results First three days of exercise --     Duration -- Progress to 30 minutes of  aerobic without signs/symptoms of physical distress --     Intensity -- THRR unchanged --       Progression   Progression -- Continue to progress workloads to maintain intensity without signs/symptoms of physical distress. --     Average METs -- 1.7 --       Resistance Training   Training Prescription -- Yes --     Weight -- 2 lb --     Reps -- 10-15 --       Interval Training   Interval Training -- No --       Oxygen   Oxygen -- Continuous --     Liters -- 4 --       Treadmill   MPH -- 1.2 --     Grade -- 0 --     Minutes -- 15 --     METs -- 1.92 --       NuStep   Level -- 1 --     Minutes -- 15 --     METs -- 1.5 --       Biostep-RELP   Level -- 1 --     Minutes -- 15 --     METs -- 2 --       Home Exercise Plan   Plans to continue exercise at -- -- Home (comment)  walking     Frequency -- -- Add 2 additional days to program exercise sessions.     Initial Home Exercises Provided -- -- 11/23/22  Oxygen   Maintain Oxygen Saturation -- 88% or higher --              Exercise Comments:   Exercise Comments     Row Name 11/04/22 1411           Exercise Comments First full day of exercise!  Patient was oriented to gym and equipment including functions, settings, policies, and procedures.  Patient's individual exercise prescription and treatment plan were reviewed.  All starting workloads were established based on the results of the 6 minute walk test done at initial orientation visit.  The plan for exercise progression was also introduced  and progression will be customized based on patient's performance and goals.                Exercise Goals and Review:   Exercise Goals     Row Name 11/02/22 1731             Exercise Goals   Increase Physical Activity Yes       Intervention Provide advice, education, support and counseling about physical activity/exercise needs.;Develop an individualized exercise prescription for aerobic and resistive training based on initial evaluation findings, risk stratification, comorbidities and participant's personal goals.       Expected Outcomes Short Term: Attend rehab on a regular basis to increase amount of physical activity.;Long Term: Add in home exercise to make exercise part of routine and to increase amount of physical activity.;Long Term: Exercising regularly at least 3-5 days a week.       Increase Strength and Stamina Yes       Intervention Provide advice, education, support and counseling about physical activity/exercise needs.;Develop an individualized exercise prescription for aerobic and resistive training based on initial evaluation findings, risk stratification, comorbidities and participant's personal goals.       Expected Outcomes Short Term: Increase workloads from initial exercise prescription for resistance, speed, and METs.;Short Term: Perform resistance training exercises routinely during rehab and add in resistance training at home;Long Term: Improve cardiorespiratory fitness, muscular endurance and strength as measured by increased METs and functional capacity ( )       Able to understand and use rate of perceived exertion (RPE) scale Yes       Intervention Provide education and explanation on how to use RPE scale       Expected Outcomes Short Term: Able to use RPE daily in rehab to express subjective intensity level;Long Term:  Able to use RPE to guide intensity level when exercising independently       Able to understand and use Dyspnea scale Yes        Intervention Provide education and explanation on how to use Dyspnea scale       Expected Outcomes Short Term: Able to use Dyspnea scale daily in rehab to express subjective sense of shortness of breath during exertion;Long Term: Able to use Dyspnea scale to guide intensity level when exercising independently       Knowledge and understanding of Target Heart Rate Range (THRR) Yes       Intervention Provide education and explanation of THRR including how the numbers were predicted and where they are located for reference       Expected Outcomes Short Term: Able to state/look up THRR;Long Term: Able to use THRR to govern intensity when exercising independently;Short Term: Able to use daily as guideline for intensity in rehab       Able to check pulse independently Yes  Intervention Provide education and demonstration on how to check pulse in carotid and radial arteries.;Review the importance of being able to check your own pulse for safety during independent exercise       Expected Outcomes Short Term: Able to explain why pulse checking is important during independent exercise       Understanding of Exercise Prescription Yes       Intervention Provide education, explanation, and written materials on patient's individual exercise prescription       Expected Outcomes Short Term: Able to explain program exercise prescription;Long Term: Able to explain home exercise prescription to exercise independently                Exercise Goals Re-Evaluation :  Exercise Goals Re-Evaluation     Row Name 11/04/22 1411 11/16/22 1426 11/23/22 1545         Exercise Goal Re-Evaluation   Exercise Goals Review Increase Physical Activity;Able to understand and use rate of perceived exertion (RPE) scale;Knowledge and understanding of Target Heart Rate Range (THRR);Understanding of Exercise Prescription;Increase Strength and Stamina;Able to understand and use Dyspnea scale;Able to check pulse independently  Increase Physical Activity;Increase Strength and Stamina;Understanding of Exercise Prescription Increase Physical Activity;Increase Strength and Stamina;Understanding of Exercise Prescription     Comments Reviewed RPE scale, THR and program prescription with pt today.  Pt voiced understanding and was given a copy of goals to take home. Sureya is off to a good start in the program. She has done well with the T4 nustep and biostep at level 1. She also was able to walk up to 1.2 mph on the treadmill with no incline. She had an overall average MET level of 1.7 METs during her first three sessions of rehab. We will continue to monitor her progress in the program. Jenevie is doing well in rehab.  She was out on vacation for two weeks.  She did walk some with her cane and her rollator.  She has noticed that her saturations will drop with walking so she will need to take a rest break to do her PLB to help hre recover. She was grateful to be able ot get away.  She also did her PT exercises and strength work while away.  eviewed home exercise with pt today.  Pt plans to walk at home for exercise.  Reviewed THR, pulse, RPE, sign and symptoms, pulse oximetery and when to call 911 or MD.  Also discussed weather considerations and indoor options.  Pt voiced understanding. Also reviewed goals with patient.  Service time [phone]     Expected Outcomes Short: Use RPE daily to regulate intensity.  Long: Follow program prescription in THR. Short: Continue to follow current exercise prescription. Long: Continue to improve strength and stamina. Shrot: Back to routine exercise in rehab Long; Conitnue to improve stamina              Discharge Exercise Prescription (Final Exercise Prescription Changes):  Exercise Prescription Changes - 11/23/22 1600       Home Exercise Plan   Plans to continue exercise at Home (comment)   walking   Frequency Add 2 additional days to program exercise sessions.    Initial Home Exercises Provided  11/23/22             Nutrition:  Target Goals: Understanding of nutrition guidelines, daily intake of sodium 1500mg , cholesterol 200mg , calories 30% from fat and 7% or less from saturated fats, daily to have 5 or more servings of fruits and vegetables.  Education: All About Nutrition: -Group instruction provided by verbal, written material, interactive activities, discussions, models, and posters to present general guidelines for heart healthy nutrition including fat, fiber, MyPlate, the role of sodium in heart healthy nutrition, utilization of the nutrition label, and utilization of this knowledge for meal planning. Follow up email sent as well. Written material given at graduation. Flowsheet Row Pulmonary Rehab from 12/25/2020 in [facility] Cardiac and Pulmonary Rehab  Date 11/20/20  Educator [facility]  Instruction Review Code 1- Verbalizes Understanding       Biometrics:  Pre Biometrics - 11/02/22 1732       Pre Biometrics   Height 5\' 2"  (1.575 m)    Weight 129 lb (58.5 kg)    Waist Circumference 33 inches    Hip Circumference 39 inches    Waist to Hip Ratio 0.85 %    BMI (Calculated) 23.59    Single Leg Stand 2.78 seconds              Nutrition Therapy Plan and Nutrition Goals:  Nutrition Therapy & Goals - 11/02/22 1735       Intervention Plan   Intervention Prescribe, educate and counsel regarding individualized specific dietary modifications aiming towards targeted core components such as weight, hypertension, lipid management, diabetes, heart failure and other comorbidities.    Expected Outcomes Short Term Goal: Understand basic principles of dietary content, such as calories, fat, sodium, cholesterol and nutrients.;Short Term Goal: A plan has been developed with personal nutrition goals set during dietitian appointment.;Long Term Goal: Adherence to prescribed nutrition plan.             Nutrition Assessments:  MEDIFICTS Score Key: ?70 Need to make dietary changes   40-70 Heart Healthy Diet ? 40 Therapeutic Level Cholesterol Diet  Flowsheet Row Pulmonary Rehab from 11/02/2022 in [facility] Cardiac and Pulmonary Rehab  Picture Your Plate Total Score on Admission 81      Picture Your Plate Scores: <16 Unhealthy dietary pattern with much room for improvement. 41-50 Dietary pattern unlikely to meet recommendations for good health and room for improvement. 51-60 More healthful dietary pattern, with some room for improvement.  >60 Healthy dietary pattern, although there may be some specific behaviors that could be improved.   Nutrition Goals Re-Evaluation:  Nutrition Goals Re-Evaluation     Row Name 11/23/22 1551             Goals   Nutrition Goal Schedule appt with dietitian (husband diabetic)       Comment Gisell would like to set up an appointment to meet with dietitian.  She would like to work on her diet and her husbands as well.  She is working on watching sodium and getting in a good variety.       Expected Outcome Short: Set up dietitian appt Long: continue to work diet                Nutrition Goals Discharge (Final Nutrition Goals Re-Evaluation):  Nutrition Goals Re-Evaluation - 11/23/22 1551       Goals   Nutrition Goal Schedule appt with dietitian (husband diabetic)    Comment Shaqualla would like to set up an appointment to meet with dietitian.  She would like to work on her diet and her husbands as well.  She is working on watching sodium and getting in a good variety.    Expected Outcome Short: Set up dietitian appt Long: continue to work diet  Psychosocial: Target Goals: Acknowledge presence or absence of significant depression and/or stress, maximize coping skills, provide positive support system. Participant is able to verbalize types and ability to use techniques and skills needed for reducing stress and depression.   Education: Stress, Anxiety, and Depression - Group verbal and visual presentation to define  topics covered.  Reviews how body is impacted by stress, anxiety, and depression.  Also discusses healthy ways to reduce stress and to treat/manage anxiety and depression.  Written material given at graduation. Flowsheet Row Pulmonary Rehab from 12/25/2020 in [facility] Cardiac and Pulmonary Rehab  Date 10/16/20  Educator [facility]  Instruction Review Code 1- [employer] Understanding       Education: Sleep Hygiene -Provides group verbal and written instruction about how sleep can affect your health.  Define sleep hygiene, discuss sleep cycles and impact of sleep habits. Review good sleep hygiene tips.  Flowsheet Row Cardiac Rehab from 05/30/2018 in [facility] Cardiac and Pulmonary Rehab  Date 03/16/18  Educator [facility]  Instruction Review Code 1- Verbalizes Understanding       Initial Review & Psychosocial Screening:  Initial Psych Review & Screening - 10/28/22 1352       Initial Review   Current issues with Current Anxiety/Panic      Family Dynamics   Good Support System? Yes    Comments Ylenia can look to her husband and her two daughters for support. Her daughters call her almost everyday. She also has alot of faith that gets her through alot.      Barriers   Psychosocial barriers to participate in program The patient should benefit from training in stress management and relaxation.      Screening Interventions   Interventions To provide support and resources with identified psychosocial needs;Encouraged to exercise;Provide feedback about the scores to participant    Expected Outcomes Short Term goal: Utilizing psychosocial counselor, staff and physician to assist with identification of specific Stressors or current issues interfering with healing process. Setting desired goal for each stressor or current issue identified.;Long Term Goal: Stressors or current issues are controlled or eliminated.;Short Term goal: Identification and review with participant of any Quality of Life or Depression concerns found  by scoring the questionnaire.;Long Term goal: The participant improves quality of Life and PHQ9 Scores as seen by post scores and/or verbalization of changes             Quality of Life Scores:  Scores of 19 and below usually indicate a poorer quality of life in these areas.  A difference of  2-3 points is a clinically meaningful difference.  A difference of 2-3 points in the total score of the Quality of Life Index has been associated with significant improvement in overall quality of life, self-image, physical symptoms, and general health in studies assessing change in quality of life.  PHQ-9: Review Flowsheet  More data may exist      11/02/2022 01/02/2021 09/02/2020 05/30/2018 03/08/2018  Depression screen PHQ 2/9  Decreased Interest 0 0 0 0 1  Down, Depressed, Hopeless 0 0 0 0 0  PHQ - 2 Score 0 0 0 0 1  Altered sleeping 1 1 - - 0  Tired, decreased energy 1 1 1 1 1   Change in appetite - 0 1 0 0  Feeling bad or failure about yourself  0 0 0 0 0  Trouble concentrating 0 0 0 0 0  Moving slowly or fidgety/restless 0 0 0 0 0  Suicidal thoughts 0 0 0 0 0  PHQ-9 Score 2 2 - - 2  Difficult doing work/chores - Not difficult at all Not difficult at all - Not difficult at all   Interpretation of Total Score  Total Score Depression Severity:  1-4 = Minimal depression, 5-9 = Mild depression, 10-14 = Moderate depression, 15-19 = Moderately severe depression, 20-27 = Severe depression   Psychosocial Evaluation and Intervention:  Psychosocial Evaluation - 10/28/22 1353       Psychosocial Evaluation & Interventions   Interventions Encouraged to exercise with the program and follow exercise prescription;Relaxation education;Stress management education    Comments Ketzia can look to her husband and her two daughters for support. Her daughters call her almost everyday. She also has alot of faith that gets her through alot.    Expected Outcomes Short: Start [name] to help with mood. Long:  Maintain a healthy mental state.    Continue Psychosocial Services  Follow up required by staff             Psychosocial Re-Evaluation:  Psychosocial Re-Evaluation     Row Name 11/23/22 1548             Psychosocial Re-Evaluation   Current issues with Current Stress Concerns       Comments Corianna returned today from a two week vacation.  She got to go to [facility] for vacation.  She enjoyed going away as this was the first time she was able to get away since first getting sick.  She enjoyed getting out and seeing some of the sights and going through gardens as well. She continues to sleep well and feels good overall.  Her biggest frustration is not beinig able to breathe as well with acitvity.  She also admits to forgetting to breathe on occassion when doing things.       Expected Outcomes short: COntinue to exercise for mood boost Long: Conitnue to stay positive       Interventions Encouraged to attend Pulmonary Rehabilitation for the exercise                Psychosocial Discharge (Final Psychosocial Re-Evaluation):  Psychosocial Re-Evaluation - 11/23/22 1548       Psychosocial Re-Evaluation   Current issues with Current Stress Concerns    Comments Illona returned today from a two week vacation.  She got to go to [facility] for vacation.  She enjoyed going away as this was the first time she was able to get away since first getting sick.  She enjoyed getting out and seeing some of the sights and going through gardens as well. She continues to sleep well and feels good overall.  Her biggest frustration is not beinig able to breathe as well with acitvity.  She also admits to forgetting to breathe on occassion when doing things.    Expected Outcomes short: COntinue to exercise for mood boost Long: Conitnue to stay positive    Interventions Encouraged to attend Pulmonary Rehabilitation for the exercise             Education: Education Goals: Education classes  will be provided on a weekly basis, covering required topics. Participant will state understanding/return demonstration of topics presented.  Learning Barriers/Preferences:  Learning Barriers/Preferences - 10/28/22 1351       Learning Barriers/Preferences   Learning Barriers None    Learning Preferences None             General Pulmonary Education Topics:  Infection Prevention: - Provides verbal and written material to  individual with discussion of infection control including proper hand washing and proper equipment cleaning during exercise session. Flowsheet Row Pulmonary Rehab from 11/02/2022 in [facility] Cardiac and Pulmonary Rehab  Date 11/02/22  Educator [facility]  Instruction Review Code 1- Verbalizes Understanding       Falls Prevention: - Provides verbal and written material to individual with discussion of falls prevention and safety. Flowsheet Row Pulmonary Rehab from 11/02/2022 in [facility] Cardiac and Pulmonary Rehab  Date 11/02/22  Educator [facility]  Instruction Review Code 1- Verbalizes Understanding       Chronic Lung Disease Review: - Group verbal instruction with posters, models, PowerPoint presentations and videos,  to review new updates, new respiratory medications, new advancements in procedures and treatments. Providing information on websites and "800" numbers for continued self-education. Includes information about supplement oxygen, available portable oxygen systems, continuous and intermittent flow rates, oxygen safety, concentrators, and Medicare reimbursement for oxygen. Explanation of Pulmonary Drugs, including class, frequency, complications, importance of spacers, rinsing mouth after steroid MDI's, and proper cleaning methods for nebulizers. Review of basic lung anatomy and physiology related to function, structure, and complications of lung disease. Review of risk factors. Discussion about methods for diagnosing sleep apnea and types of masks and machines for OSA.  Includes a review of the use of types of environmental controls: home humidity, furnaces, filters, dust mite/pet prevention, HEPA vacuums. Discussion about weather changes, air quality and the benefits of nasal washing. Instruction on Warning signs, infection symptoms, calling MD promptly, preventive modes, and value of vaccinations. Review of effective airway clearance, coughing and/or vibration techniques. Emphasizing that all should Create an Action Plan. Written material given at graduation. Flowsheet Row Pulmonary Rehab from 11/02/2022 in [facility] Cardiac and Pulmonary Rehab  Education need identified 11/02/22       AED/CPR: - Group verbal and written instruction with the use of models to demonstrate the basic use of the AED with the basic ABC's of resuscitation.    Anatomy and Cardiac Procedures: - Group verbal and visual presentation and models provide information about basic cardiac anatomy and function. Reviews the testing methods done to diagnose heart disease and the outcomes of the test results. Describes the treatment choices: Medical Management, Angioplasty, or Coronary Bypass Surgery for treating various heart conditions including Myocardial Infarction, Angina, Valve Disease, and Cardiac Arrhythmias.  Written material given at graduation. Flowsheet Row Pulmonary Rehab from 12/25/2020 in [facility] Cardiac and Pulmonary Rehab  Date 11/06/20  Educator [facility]  Instruction Review Code 1- Verbalizes Understanding       Medication Safety: - Group verbal and visual instruction to review commonly prescribed medications for heart and lung disease. Reviews the medication, class of the drug, and side effects. Includes the steps to properly store meds and maintain the prescription regimen.  Written material given at graduation.   Other: -Provides group and verbal instruction on various topics (see comments)   Knowledge Questionnaire Score:  Knowledge Questionnaire Score - 11/02/22 1733        Knowledge Questionnaire Score   Pre Score 17/18              Core Components/Risk Factors/Patient Goals at Admission:  Personal Goals and Risk Factors at Admission - 11/02/22 1735       Core Components/Risk Factors/Patient Goals on Admission    Weight Management Yes;Weight Maintenance    Intervention Weight Management: Develop a combined nutrition and exercise program designed to reach desired caloric intake, while maintaining appropriate intake of nutrient and fiber, sodium and fats, and  appropriate energy expenditure required for the weight goal.;Weight Management: Provide education and appropriate resources to help participant work on and attain dietary goals.;Weight Management/Obesity: Establish reasonable short term and long term weight goals.    Admit Weight 129 lb (58.5 kg)    Goal Weight: Short Term 129 lb (58.5 kg)    Goal Weight: Long Term 129 lb (58.5 kg)    Expected Outcomes Short Term: Continue to assess and modify interventions until short term weight is achieved;Long Term: Adherence to nutrition and physical activity/exercise program aimed toward attainment of established weight goal;Understanding recommendations for meals to include 15-35% energy as protein, 25-35% energy from fat, 35-60% energy from carbohydrates, less than 200mg  of dietary cholesterol, 20-35 gm of total fiber daily;Understanding of distribution of calorie intake throughout the day with the consumption of 4-5 meals/snacks;Weight Maintenance: Understanding of the daily nutrition guidelines, which includes 25-35% calories from fat, 7% or less cal from saturated fats, less than 200mg  cholesterol, less than 1.5gm of sodium, & 5 or more servings of fruits and vegetables daily    Improve shortness of breath with ADL's Yes    Intervention Provide education, individualized exercise plan and daily activity instruction to help decrease symptoms of SOB with activities of daily living.    Expected Outcomes Short Term:  Improve cardiorespiratory fitness to achieve a reduction of symptoms when performing ADLs;Long Term: Be able to perform more ADLs without symptoms or delay the onset of symptoms    Heart Failure Yes    Intervention Provide a combined exercise and nutrition program that is supplemented with education, support and counseling about heart failure. Directed toward relieving symptoms such as shortness of breath, decreased exercise tolerance, and extremity edema.    Expected Outcomes Improve functional capacity of life;Short term: Attendance in program 2-3 days a week with increased exercise capacity. Reported lower sodium intake. Reported increased fruit and vegetable intake. Reports medication compliance.;Short term: Daily weights obtained and reported for increase. Utilizing diuretic protocols set by physician.;Long term: Adoption of self-care skills and reduction of barriers for early signs and symptoms recognition and intervention leading to self-care maintenance.    Hypertension Yes    Intervention Provide education on lifestyle modifcations including regular physical activity/exercise, weight management, moderate sodium restriction and increased consumption of fresh fruit, vegetables, and low fat dairy, alcohol moderation, and smoking cessation.;Monitor prescription use compliance.    Expected Outcomes Short Term: Continued assessment and intervention until BP is < 140/40mm HG in hypertensive participants. < 130/33mm HG in hypertensive participants with diabetes, heart failure or chronic kidney disease.;Long Term: Maintenance of blood pressure at goal levels.    Lipids Yes    Intervention Provide education and support for participant on nutrition & aerobic/resistive exercise along with prescribed medications to achieve LDL 70mg , HDL >40mg .    Expected Outcomes Short Term: Participant states understanding of desired cholesterol values and is compliant with medications prescribed. Participant is following  exercise prescription and nutrition guidelines.;Long Term: Cholesterol controlled with medications as prescribed, with individualized exercise RX and with personalized nutrition plan. Value goals: LDL < 70mg , HDL > 40 mg.             Education:Diabetes - Individual verbal and written instruction to review signs/symptoms of diabetes, desired ranges of glucose level fasting, after meals and with exercise. Acknowledge that pre and post exercise glucose checks will be done for 3 sessions at entry of program.   Know Your Numbers and Heart Failure: - Group verbal and visual instruction to discuss disease  risk factors for cardiac and pulmonary disease and treatment options.  Reviews associated critical values for Overweight/Obesity, Hypertension, Cholesterol, and Diabetes.  Discusses basics of heart failure: signs/symptoms and treatments.  Introduces Heart Failure Zone chart for action plan for heart failure.  Written material given at graduation. Flowsheet Row Pulmonary Rehab from 12/25/2020 in [facility] Cardiac and Pulmonary Rehab  Date 12/04/20  Educator [facility]  Instruction Review Code 1- Verbalizes Understanding       Core Components/Risk Factors/Patient Goals Review:   Goals and Risk Factor Review     Row Name 11/23/22 1554             Core Components/Risk Factors/Patient Goals Review   Personal Goals Review Weight Management/Obesity;Improve shortness of breath with ADL's;Increase knowledge of respiratory medications and ability to use respiratory devices properly.;Hypertension;Heart Failure       Review Bhuvi is doing well in rehab.  She is up a little to 132 lb from being at beach last two weeks.  She would like to get it back down to her 129 lb that she is normally at.  She is working on her breathing and does get short of breath with activity and her saturations will drop, especially when she forgets to breathe. She is doing well in her meds and uses her inhalers.  She will also do her  breathing exercises at night to help.  She checks her pressures at home and they have been doing well. She does not have any heart failure symptoms currently and feels pretty good overall.       Expected Outcomes short: Continue to work on getting vacation weight back down Long: conitnue to work on breathing                Core Components/Risk Factors/Patient Goals at Discharge (Final Review):   Goals and Risk Factor Review - 11/23/22 1554       Core Components/Risk Factors/Patient Goals Review   Personal Goals Review Weight Management/Obesity;Improve shortness of breath with ADL's;Increase knowledge of respiratory medications and ability to use respiratory devices properly.;Hypertension;Heart Failure    Review Ofelia is doing well in rehab.  She is up a little to 132 lb from being at beach last two weeks.  She would like to get it back down to her 129 lb that she is normally at.  She is working on her breathing and does get short of breath with activity and her saturations will drop, especially when she forgets to breathe. She is doing well in her meds and uses her inhalers.  She will also do her breathing exercises at night to help.  She checks her pressures at home and they have been doing well. She does not have any heart failure symptoms currently and feels pretty good overall.    Expected Outcomes short: Continue to work on getting vacation weight back down Long: conitnue to work on breathing             ITP Comments:  ITP Comments     Row Name 10/28/22 1348 11/02/22 1725 11/04/22 1411 11/25/22 0829     ITP Comments Virtual Visit completed. Patient informed on EP and RD appointment and 6 Minute walk test. Patient also informed of patient health questionnaires on My Chart. Patient Verbalizes understanding. Visit diagnosis can be found in [facility] 09/10/2022. Completed and gym orientation. Initial ITP created and sent for review to Dr. Jinny Sanders, Medical Director. First full day of  exercise!  Patient was oriented to gym  and equipment including functions, settings, policies, and procedures.  Patient's individual exercise prescription and treatment plan were reviewed.  All starting workloads were established based on the results of the 6 minute walk test done at initial orientation visit.  The plan for exercise progression was also introduced and progression will be customized based on patient's performance and goals. 30 Day review completed. Medical Director ITP review done, changes made as directed, and signed approval by Medical Director.   new to program             Comments:

## 2022-12-10 ENCOUNTER — Encounter: Payer: Medicare PPO | Admitting: *Deleted

## 2022-12-10 DIAGNOSIS — J849 Interstitial pulmonary disease, unspecified: Secondary | ICD-10-CM

## 2022-12-10 NOTE — Progress Notes (Signed)
Daily Session Note  Patient Details  Name: Joyce Evans MRN: [id number] Date of Birth: Nov 14, 1940 Referring Provider:   Flowsheet Row Pulmonary Rehab from 11/02/2022 in [facility] Cardiac and Pulmonary Rehab  Referring Provider Kloefkorn       Encounter Date: 12/10/2022  Check In:  Session Check In - 12/10/22 1548       Check-In   Supervising physician immediately available to respond to emergencies See telemetry face sheet for immediately available ER MD    Location [facility]-Cardiac & Pulmonary Rehab    Staff Present Susann Givens, RN BSN;Jessica Juanetta Gosling, MA, RCEP, CCRP, CCET;Joseph Buckman, [place]    Virtual Visit No    Medication changes reported     No    Fall or balance concerns reported    No    Warm-up and Cool-down Performed on first and last piece of equipment    Resistance Training Performed Yes    VAD Patient? No    PAD/SET Patient? No      Pain Assessment   Currently in Pain? No/denies                Social History   Tobacco Use  Smoking Status Never  Smokeless Tobacco Never    Goals Met:  Independence with exercise equipment Exercise tolerated well No report of concerns or symptoms today Strength training completed today  Goals Unmet:  Not Applicable  Comments: Pt able to follow exercise prescription today without complaint.  Will continue to monitor for progression.    Dr. Bethann Punches is Medical Director for [facility] Cardiac Rehabilitation.  Dr. Vida Rigger is Medical Director for [facility] Pulmonary Rehabilitation.

## 2022-12-14 ENCOUNTER — Encounter: Payer: Medicare PPO | Attending: Internal Medicine | Admitting: *Deleted

## 2022-12-14 DIAGNOSIS — J849 Interstitial pulmonary disease, unspecified: Secondary | ICD-10-CM | POA: Diagnosis present

## 2022-12-14 NOTE — Progress Notes (Signed)
Daily Session Note  Patient Details  Name: ABBEE Evans MRN: [id number] Date of Birth: 06/08/1941 Referring Provider:   Flowsheet Row Pulmonary Rehab from 11/02/2022 in [facility] Cardiac and Pulmonary Rehab  Referring Provider Kloefkorn       Encounter Date: 12/14/2022  Check In:      Social History   Tobacco Use  Smoking Status Never  Smokeless Tobacco Never    Goals Met:  Independence with exercise equipment Exercise tolerated well No report of concerns or symptoms today  Goals Unmet:  Not Applicable  Comments: Pt able to follow exercise prescription today without complaint.  Will continue to monitor for progression.    Dr. Bethann Punches is Medical Director for [facility] Cardiac Rehabilitation.  Dr. Vida Rigger is Medical Director for [facility] Pulmonary Rehabilitation.

## 2022-12-16 ENCOUNTER — Encounter: Payer: Medicare PPO | Admitting: *Deleted

## 2022-12-16 DIAGNOSIS — J849 Interstitial pulmonary disease, unspecified: Secondary | ICD-10-CM | POA: Diagnosis not present

## 2022-12-16 NOTE — Progress Notes (Signed)
Daily Session Note  Patient Details  Name: Joyce Evans MRN: [id number] Date of Birth: 11/12/1940 Referring Provider:   Flowsheet Row Pulmonary Rehab from 11/02/2022 in [facility] Cardiac and Pulmonary Rehab  Referring Provider Kloefkorn       Encounter Date: 12/16/2022  Check In:  Session Check In - 12/16/22 1552       Check-In   Supervising physician immediately available to respond to emergencies See telemetry face sheet for immediately available ER MD    Staff Present Bess Kinds RN, Mabeline Caras, BS, ACSM CEP, Exercise Physiologist;Joseph Reino [place], [place]    Virtual Visit No    Medication changes reported     No    Fall or balance concerns reported    No    Warm-up and Cool-down Performed on first and last piece of equipment    Resistance Training Performed Yes    VAD Patient? No    PAD/SET Patient? No      Pain Assessment   Currently in Pain? No/denies                Social History   Tobacco Use  Smoking Status Never  Smokeless Tobacco Never    Goals Met:  Independence with exercise equipment Exercise tolerated well No report of concerns or symptoms today Strength training completed today  Goals Unmet:  Not Applicable  Comments: Pt able to follow exercise prescription today without complaint.  Will continue to monitor for progression.    Dr. Bethann Punches is Medical Director for [facility] Cardiac Rehabilitation.  Dr. Vida Rigger is Medical Director for [facility] Pulmonary Rehabilitation.

## 2022-12-21 ENCOUNTER — Encounter: Payer: Medicare PPO | Admitting: *Deleted

## 2022-12-21 DIAGNOSIS — J849 Interstitial pulmonary disease, unspecified: Secondary | ICD-10-CM | POA: Diagnosis not present

## 2022-12-21 NOTE — Progress Notes (Signed)
Daily Session Note  Patient Details  Name: KATERYNA GRANTHAM MRN: [id number] Date of Birth: June 28, 1941 Referring Provider:   Flowsheet Row Pulmonary Rehab from 11/02/2022 in [facility] Cardiac and Pulmonary Rehab  Referring Provider Kloefkorn       Encounter Date: 12/21/2022  Check In:  Session Check In - 12/21/22 1549       Check-In   Supervising physician immediately available to respond to emergencies See telemetry face sheet for immediately available ER MD    Location [facility]-Cardiac & Pulmonary Rehab    Staff Present Elige Ko, RCP,RRT,BSRT;Laureen Manson Passey, BS, RRT, CPFT;Sharee Sturdy Jewel Baize, RN BSN    Virtual Visit No    Medication changes reported     No    Fall or balance concerns reported    No    Warm-up and Cool-down Performed on first and last piece of equipment    Resistance Training Performed Yes    VAD Patient? No      Pain Assessment   Currently in Pain? No/denies                Social History   Tobacco Use  Smoking Status Never  Smokeless Tobacco Never    Goals Met:  Independence with exercise equipment Exercise tolerated well No report of concerns or symptoms today Strength training completed today  Goals Unmet:  Not Applicable  Comments: Pt able to follow exercise prescription today without complaint.  Will continue to monitor for progression.    Dr. Bethann Punches is Medical Director for [facility] Cardiac Rehabilitation.  Dr. Vida Rigger is Medical Director for [facility] Pulmonary Rehabilitation.

## 2022-12-23 ENCOUNTER — Encounter: Payer: Self-pay | Admitting: *Deleted

## 2022-12-23 ENCOUNTER — Encounter: Payer: Medicare PPO | Admitting: *Deleted

## 2022-12-23 DIAGNOSIS — J849 Interstitial pulmonary disease, unspecified: Secondary | ICD-10-CM

## 2022-12-23 NOTE — Progress Notes (Signed)
Daily Session Note  Patient Details  Name: Joyce Evans MRN: [id number] Date of Birth: [birth year] Referring Provider:   Flowsheet Row Pulmonary Rehab from 11/02/2022 in [facility] Cardiac and Pulmonary Rehab  Referring Provider Kloefkorn       Encounter Date: 12/23/2022  Check In:  Session Check In - 12/23/22 1537       Check-In   Supervising physician immediately available to respond to emergencies See telemetry face sheet for immediately available ER MD    Location [facility]-Cardiac & Pulmonary Rehab    Staff Present Susann Givens, RN BSN;Joseph Slater[place], RCP,RRT,BSRT;Laureen Lawson, [place], RRT, CPFT    Virtual Visit No    Medication changes reported     No    Fall or balance concerns reported    No    Warm-up and Cool-down Performed on first and last piece of equipment    Resistance Training Performed Yes    VAD Patient? No    PAD/SET Patient? No      Pain Assessment   Currently in Pain? No/denies                Social History   Tobacco Use  Smoking Status Never  Smokeless Tobacco Never    Goals Met:  Independence with exercise equipment Exercise tolerated well No report of concerns or symptoms today Strength training completed today  Goals Unmet:  Not Applicable  Comments: Pt able to follow exercise prescription today without complaint.  Will continue to monitor for progression.    Dr. Bethann Punches is Medical Director for [facility] Cardiac Rehabilitation.  Dr. Vida Rigger is Medical Director for [facility] Pulmonary Rehabilitation.

## 2022-12-23 NOTE — Progress Notes (Signed)
Pulmonary Individual Treatment Plan  Patient Details  Name: JELISSA DANZIG MRN: [id number] Date of Birth: 24-Sep-1940 Referring Provider:   Flowsheet Row Pulmonary Rehab from 11/02/2022 in [facility] Cardiac and Pulmonary Rehab  Referring Provider Kloefkorn       Initial Encounter Date:  Flowsheet Row Pulmonary Rehab from 11/02/2022 in [facility] Cardiac and Pulmonary Rehab  Date 11/02/22       Visit Diagnosis: ILD (interstitial lung disease) (HCC)  Patient's Home Medications on Admission:  Current Outpatient Medications:    acetaminophen (TYLENOL) 500 MG tablet, Take 500 mg by mouth every 6 (six) hours as needed., Disp: , Rfl:    amLODipine (NORVASC) 5 MG tablet, Take 5 mg by mouth daily. (Patient not taking: Reported on 10/16/2021), Disp: , Rfl:    apixaban (ELIQUIS) 5 MG TABS tablet, Take 5 mg by mouth 2 (two) times daily.  (Patient not taking: Reported on 10/28/2022), Disp: , Rfl:    apixaban (ELIQUIS) 5 MG TABS tablet, Take 1 tablet by mouth 2 (two) times daily., Disp: , Rfl:    Apple Cider Vinegar 300 MG TABS, Take 1 tablet by mouth daily., Disp: , Rfl:    atorvastatin (LIPITOR) 40 MG tablet, Take by mouth. (Patient not taking: Reported on 10/28/2022), Disp: , Rfl:    atorvastatin (LIPITOR) 40 MG tablet, Take 1 tablet by mouth daily., Disp: , Rfl:    brimonidine (ALPHAGAN P) 0.1 % SOLN, Place 1 drop into the right eye 2 (two) times daily., Disp: , Rfl:    brimonidine (ALPHAGAN) 0.2 % ophthalmic solution, SMARTSIG:In Eye(s) (Patient not taking: Reported on 10/28/2022), Disp: , Rfl:    Brinzolamide-Brimonidine 1-0.2 % SUSP, Place 1 drop into the left eye 2 (two) times daily., Disp: , Rfl:    carvedilol (COREG) 6.25 MG tablet, Take 1 tablet (6.25 mg total) by mouth 2 (two) times daily with a meal. (Patient not taking: Reported on 10/28/2022), Disp: , Rfl:    cetirizine (ZYRTEC) 10 MG tablet, Take 10 mg by mouth daily., Disp: , Rfl:    Cholecalciferol (VITAMIN D3) 50 MCG (2000 UT) capsule, Take by  mouth., Disp: , Rfl:    Cholecalciferol 50 MCG (2000 UT) CAPS, Take 3 capsules by mouth in the morning. (Patient not taking: Reported on 10/28/2022), Disp: , Rfl:    citalopram (CELEXA) 20 MG tablet, Take 10 mg by mouth daily. (Patient not taking: Reported on 10/28/2022), Disp: , Rfl:    citalopram (CELEXA) 20 MG tablet, Take by mouth., Disp: , Rfl:    famotidine (PEPCID) 40 MG tablet, Take 40 mg by mouth at bedtime., Disp: , Rfl:    fluorouracil (EFUDEX) 5 % cream, Apply topically 2 (two) times daily. On August 15 start cream to nose bid for 7 days (Patient not taking: Reported on 10/28/2022), Disp: 15 g, Rfl: 0   fluticasone (FLONASE) 50 MCG/ACT nasal spray, Place 2 sprays into the nose daily., Disp: , Rfl:    fluticasone (FLONASE) 50 MCG/ACT nasal spray, Place into the nose., Disp: , Rfl:    furosemide (LASIX) 20 MG tablet, Take 20 mg by mouth daily., Disp: , Rfl:    furosemide (LASIX) 20 MG tablet, TAKE 1 TABLET ONE TIME DAILY, Disp: , Rfl:    gabapentin (NEURONTIN) 300 MG capsule, Take 1 capsule (300 mg total) by mouth 2 (two) times daily for 14 days., Disp: 28 capsule, Rfl: 0   losartan (COZAAR) 50 MG tablet, Take 1 tablet by mouth daily. (Patient not taking: Reported on 10/16/2021), Disp: ,  Rfl:    magnesium gluconate 54mg /51ml syringe, Take by mouth., Disp: , Rfl:    methocarbamol (ROBAXIN) 500 MG tablet, Take 1 tablet (500 mg total) by mouth every 8 (eight) hours as needed for muscle spasms., Disp: , Rfl:    multivitamin-iron-minerals-folic acid (CENTRUM) chewable tablet, Chew 1 tablet by mouth daily., Disp: , Rfl:    omeprazole (PRILOSEC) 40 MG capsule, Take by mouth., Disp: , Rfl:    pantoprazole (PROTONIX) 40 MG tablet, Take 40 mg by mouth daily., Disp: , Rfl:    predniSONE (DELTASONE) 1 MG tablet, Take 1 tablet by mouth See admin instructions. Take 9 tablets (9 mg total) by mouth once daily for 14 days, THEN 8 tablets (8 mg total) once daily for 14 days, THEN 7 tablets (7 mg total) once daily  for 14 days, THEN 6 tablets (6 mg total) once daily for 14 days, THEN 5 tablets (5 mg total) once daily for 14 days, THEN 4 tablets (4 mg total) once daily for 14 days, THEN 3 tablets (3 mg total) once daily for 14 days, THEN 2 tablets (2 mg total) once daily for 14 days, THEN 1 tablet (1 mg total) once daily for 14 days., Disp: , Rfl:    Probiotic Product (PROBIOTIC DAILY PO), Take by mouth daily. (Patient not taking: Reported on 10/28/2022), Disp: , Rfl:    senna-docusate (SENOKOT-S) 8.6-50 MG tablet, Take 1 tablet by mouth 2 (two) times daily., Disp: , Rfl:    timolol (BETIMOL) 0.5 % ophthalmic solution, Place 1 drop into both eyes daily., Disp: , Rfl:    White Petrolatum-Mineral Oil (SYSTANE NIGHTTIME) OINT, Apply 1 application. to eye at bedtime., Disp: , Rfl:    ZINC CITRATE PO, Take 1 tablet by mouth 2 (two) times daily. 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 Excised: 05/19/2016, margins free.   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   Tricuspid valve disorder    leak    Tobacco Use: Social History   Tobacco Use  Smoking Status Never  Smokeless Tobacco Never    Labs: Review Flowsheet       Latest Ref Rng & Units 06/17/2020 06/18/2020  Labs for ITP Cardiac  and Pulmonary Rehab  Cholestrol 0 - 200 mg/dL - 409   LDL (calc) 0 - 99 mg/dL - 811   Direct LDL 0 - 99 mg/dL 914.7  -  HDL-C >82 mg/dL - 51   Trlycerides <956 mg/dL - 57   Hemoglobin O1H 4.8 - 5.6 % 5.4  -     Pulmonary Assessment Scores:  Pulmonary Assessment Scores     Row Name 11/02/22 1737         ADL UCSD   ADL Phase Entry     SOB Score total 67     Rest 2     Walk 3     Stairs 4     Bath 2     Dress 2     Shop 3       CAT Score   CAT Score 23       mMRC Score   mMRC Score 2              UCSD: Self-administered rating of dyspnea associated with activities of daily living (ADLs) 6-point scale (0 = "not at all" to 5 = "maximal or unable to do because of breathlessness")  Scoring Scores range from 0 to 120.  Minimally important difference is 5 units  CAT: CAT can identify the health impairment of COPD patients and is better correlated with disease progression.  CAT has a scoring range of zero to 40. The CAT score is classified into four groups of low (less than 10), medium (10 - 20), high (21-30) and very high (31-40) based on the impact level of disease on health status. A CAT score over 10 suggests significant symptoms.  A worsening CAT score could be explained by an exacerbation, poor medication adherence, poor inhaler technique, or progression of COPD or comorbid conditions.  CAT MCID is 2 points  mMRC: mMRC (Modified Medical Research Council) Dyspnea Scale is used to assess the degree of baseline functional disability in patients of respiratory disease due to dyspnea. No minimal important difference is established. A decrease in score of 1 point or greater is considered a positive change.   Pulmonary Function Assessment:  Pulmonary Function Assessment - 10/28/22 1350       Breath   Shortness of Breath Yes;Limiting activity             Exercise Target Goals: Exercise Program Goal: Individual exercise prescription set using results from initial  6 min walk test and THRR while considering  patient's activity barriers and safety.   Exercise Prescription Goal: Initial exercise prescription builds to 30-45 minutes a day of aerobic activity, 2-3 days per week.  Home exercise guidelines will be given to patient during program as part of exercise prescription that the participant will acknowledge.  Education: Aerobic Exercise: - Group verbal and visual presentation on the components of exercise prescription. Introduces F.I.T.T principle from ACSM for exercise prescriptions.  Reviews F.I.T.T. principles of aerobic exercise including progression. Written material given at graduation.   Education: Resistance Exercise: - Group verbal and visual presentation on the components of exercise prescription. Introduces F.I.T.T principle from ACSM for exercise prescriptions  Reviews F.I.T.T. principles of resistance exercise including progression. Written material given at graduation. Flowsheet Row Pulmonary Rehab from 12/25/2020 in [facility] Cardiac and Pulmonary Rehab  Date 11/06/20  Educator AS  Instruction Review Code 1- Verbalizes Understanding        Education: Exercise & Equipment Safety: - Individual verbal instruction and demonstration of equipment use and safety with use of the equipment. Flowsheet Row Pulmonary Rehab from 11/25/2022 in [facility] Cardiac and Pulmonary Rehab  Date 11/02/22  Educator [facility]  Instruction Review Code 1- Verbalizes Understanding       Education: Exercise Physiology & General Exercise Guidelines: - Group verbal and written instruction with models to review the exercise physiology of the cardiovascular system and associated critical values. Provides general  exercise guidelines with specific guidelines to those with heart or lung disease.  Flowsheet Row Pulmonary Rehab from 12/25/2020 in [facility] Cardiac and Pulmonary Rehab  Date 10/23/20  Educator AS  Instruction Review Code 1- Verbalizes Understanding       Education:  Flexibility, Balance, Mind/Body Relaxation: - Group verbal and visual presentation with interactive activity on the components of exercise prescription. Introduces F.I.T.T principle from ACSM for exercise prescriptions. Reviews F.I.T.T. principles of flexibility and balance exercise training including progression. Also discusses the mind body connection.  Reviews various relaxation techniques to help reduce and manage stress (i.e. Deep breathing, progressive muscle relaxation, and visualization). Balance handout provided to take home. Written material given at graduation. Flowsheet Row Pulmonary Rehab from 12/25/2020 in [facility] Cardiac and Pulmonary Rehab  Date 09/11/20  Educator AS  Instruction Review Code 1- Verbalizes Understanding       Activity Barriers & Risk Stratification:  Activity Barriers & Cardiac Risk Stratification - 11/02/22 1728       Activity Barriers & Cardiac Risk Stratification   Activity Barriers Deconditioning;Muscular Weakness;Shortness of Breath;History of Falls;Balance Concerns;Joint Problems             6 Minute Walk:  6 Minute Walk     Row Name 11/02/22 1726         6 Minute Walk   Phase Initial     Distance 750 feet     Walk Time 6 minutes     # of Rest Breaks 0     MPH 1.42     METS 1.48     RPE 13     Perceived Dyspnea  2     VO2 Peak 5.16     Symptoms No     Resting HR 69 bpm     Resting BP 122/72     Resting Oxygen Saturation  96 %     Exercise Oxygen Saturation  during 6 min walk 85 %     Max Ex. HR 104 bpm     Max Ex. BP 124/72     2 Minute Post BP 114/62       Interval HR   1 Minute HR 89     2 Minute HR 88     3 Minute HR 97     4 Minute HR 99     5 Minute HR 102     6 Minute HR 104     2 Minute Post HR 60     Interval Heart Rate? Yes       Interval Oxygen   Interval Oxygen? Yes     Baseline Oxygen Saturation % 96 %     1 Minute Oxygen Saturation % 92 %     1 Minute Liters of Oxygen 4 L     2 Minute Oxygen Saturation % 87  %     2 Minute Liters of Oxygen 4 L     3 Minute Oxygen Saturation % 87 %     3 Minute Liters of Oxygen 4 L     4 Minute Oxygen Saturation % 85 %     4 Minute Liters of Oxygen 4 L     5 Minute Oxygen Saturation % 85 %     5 Minute Liters of Oxygen 4 L     6 Minute Oxygen Saturation % 86 %     6 Minute Liters of Oxygen 4 L     2 Minute Post Oxygen Saturation % 91 %  2 Minute Post Liters of Oxygen 4 L             Oxygen Initial Assessment:  Oxygen Initial Assessment - 11/02/22 1736       Home Oxygen   Home Oxygen Device Home Concentrator;Portable Concentrator;E-Tanks    Sleep Oxygen Prescription Continuous    Liters per minute 3    Liters per minute 3    Home Resting Oxygen Prescription Continuous    Liters per minute 2    Compliance with Home Oxygen Use Yes      Initial 6 min Walk   Oxygen Used Continuous;Portable Concentrator    Liters per minute 4      Program Oxygen Prescription   Program Oxygen Prescription Continuous    Liters per minute 3      Intervention   Short Term Goals To learn and exhibit compliance with exercise, home and travel O2 prescription;To learn and understand importance of maintaining oxygen saturations>88%;To learn and demonstrate proper use of respiratory medications;To learn and understand importance of monitoring SPO2 with pulse oximeter and demonstrate accurate use of the pulse oximeter.;To learn and demonstrate proper pursed lip breathing techniques or other breathing techniques.     Long  Term Goals Verbalizes importance of monitoring SPO2 with pulse oximeter and return demonstration;Exhibits proper breathing techniques, such as pursed lip breathing or other method taught during program session;Demonstrates proper use of MDI's;Compliance with respiratory medication;Maintenance of O2 saturations>88%;Exhibits compliance with exercise, home  and travel O2 prescription             Oxygen Re-Evaluation:  Oxygen Re-Evaluation     Row Name  11/04/22 1413 11/23/22 1558 12/10/22 1558         Program Oxygen Prescription   Program Oxygen Prescription -- Continuous Continuous     Liters per minute -- 4 4       Home Oxygen   Home Oxygen Device -- Home Concentrator;Portable Concentrator;E-Tanks Home Concentrator;Portable Concentrator;E-Tanks     Sleep Oxygen Prescription -- Continuous Continuous     Liters per minute -- 4 4     Home Exercise Oxygen Prescription -- Continuous --     Liters per minute -- 4 4     Home Resting Oxygen Prescription -- Continuous Continuous     Liters per minute -- 2  on occasion will take off during rest 2     Compliance with Home Oxygen Use -- Yes Yes       Goals/Expected Outcomes   Short Term Goals -- To learn and exhibit compliance with exercise, home and travel O2 prescription;To learn and understand importance of maintaining oxygen saturations>88%;To learn and demonstrate proper use of respiratory medications;To learn and understand importance of monitoring SPO2 with pulse oximeter and demonstrate accurate use of the pulse oximeter.;To learn and demonstrate proper pursed lip breathing techniques or other breathing techniques.  To learn and understand importance of maintaining oxygen saturations>88%;To learn and understand importance of monitoring SPO2 with pulse oximeter and demonstrate accurate use of the pulse oximeter.     Long  Term Goals -- Verbalizes importance of monitoring SPO2 with pulse oximeter and return demonstration;Exhibits proper breathing techniques, such as pursed lip breathing or other method taught during program session;Demonstrates proper use of MDI's;Compliance with respiratory medication;Maintenance of O2 saturations>88%;Exhibits compliance with exercise, home  and travel O2 prescription Maintenance of O2 saturations>88%;Verbalizes importance of monitoring SPO2 with pulse oximeter and return demonstration     Comments Reviewed PLB technique with pt.  Talked about how it  works and  it's importance in maintaining their exercise saturations. Jahda is doing well in rehab. She is compliant with her oxygen and will use 4L with acitvity and 3-4 L at night.   During rest she will occassionaly take it off completely and still be able to maintain her saturations.  She is good about using her PLB to maintain her saturations when she notices it dropping.  She does several breathing exercises as night to help her breathing.  She does note that she has been coughing more and her doctor did start her on Mucinex to help clear muscus and cough. She has a pulse oximeter to check her oxygen saturation at home. Informed and explained why it is important to have one. Reviewed that oxygen saturations should be 88 percent and above. Patient verbalizes understanding.     Goals/Expected Outcomes Short: Become more profiecient at using PLB.   Long: Become independent at using PLB. Short: Continue to monitor saturations and cough Long; Continue to use PLB Short: monitor oxygen at home with exertion. Long: maintain oxygen saturations above 88 percent independently.              Oxygen Discharge (Final Oxygen Re-Evaluation):  Oxygen Re-Evaluation - 12/10/22 1558       Program Oxygen Prescription   Program Oxygen Prescription Continuous    Liters per minute 4      Home Oxygen   Home Oxygen Device Home Concentrator;Portable Concentrator;E-Tanks    Sleep Oxygen Prescription Continuous    Liters per minute 4    Liters per minute 4    Home Resting Oxygen Prescription Continuous    Liters per minute 2    Compliance with Home Oxygen Use Yes      Goals/Expected Outcomes   Short Term Goals To learn and understand importance of maintaining oxygen saturations>88%;To learn and understand importance of monitoring SPO2 with pulse oximeter and demonstrate accurate use of the pulse oximeter.    Long  Term Goals Maintenance of O2 saturations>88%;Verbalizes importance of monitoring SPO2 with pulse oximeter and  return demonstration    Comments She has a pulse oximeter to check her oxygen saturation at home. Informed and explained why it is important to have one. Reviewed that oxygen saturations should be 88 percent and above. Patient verbalizes understanding.    Goals/Expected Outcomes Short: monitor oxygen at home with exertion. Long: maintain oxygen saturations above 88 percent independently.             Initial Exercise Prescription:  Initial Exercise Prescription - 11/02/22 1700       Date of Initial Exercise RX and Referring Provider   Date 11/02/22    Referring Provider Kloefkorn      Oxygen   Oxygen Continuous    Liters 4    Maintain Oxygen Saturation 88% or higher      Treadmill   MPH 1    Grade 0    Minutes 15    METs 1.8      REL-XR   Level 1    Speed 50    Minutes 15    METs 1.48      Biostep-RELP   Level 1    SPM 50    Minutes 15    METs 1.48      Track   Laps 10    Minutes 15    METs 1.54      Prescription Details   Frequency (times per week) 2    Duration Progress to 30  minutes of continuous aerobic without signs/symptoms of physical distress      Intensity   THRR 40-80% of Max Heartrate 97-125    Ratings of Perceived Exertion 11-13    Perceived Dyspnea 0-4      Progression   Progression Continue to progress workloads to maintain intensity without signs/symptoms of physical distress.      Resistance Training   Training Prescription Yes    Weight 2    Reps 10-15             Perform Capillary Blood Glucose checks as needed.  Exercise Prescription Changes:   Exercise Prescription Changes     Row Name 11/02/22 1700 11/16/22 1400 11/23/22 1600 11/30/22 1300 12/15/22 1500     Response to Exercise   Blood Pressure (Admit) 122/72 130/70 -- 128/74 124/60   Blood Pressure (Exercise) 124/72 136/58 -- 120/68 118/64   Blood Pressure (Exit) 114/62 124/72 -- 130/76 118/60   Heart Rate (Admit) 69 bpm 67 bpm -- 68 bpm 69 bpm   Heart Rate  (Exercise) 104 bpm 105 bpm -- 105 bpm 87 bpm   Heart Rate (Exit) 72 bpm 78 bpm -- 90 bpm 75 bpm   Oxygen Saturation (Admit) 96 % 98 % -- 98 % 96 %   Oxygen Saturation (Exercise) 85 % 88 % -- 90 % 88 %   Oxygen Saturation (Exit) 97 % 96 % -- 95 % 93 %   Rating of Perceived Exertion (Exercise) 13 13 -- 12 12   Perceived Dyspnea (Exercise) 2 1 -- 2 1   Symptoms none SOB -- SOB SOB   Comments 6 MWT results First three days of exercise -- -- --   Duration -- Progress to 30 minutes of  aerobic without signs/symptoms of physical distress -- Progress to 30 minutes of  aerobic without signs/symptoms of physical distress Progress to 30 minutes of  aerobic without signs/symptoms of physical distress   Intensity -- THRR unchanged -- THRR unchanged THRR unchanged     Progression   Progression -- Continue to progress workloads to maintain intensity without signs/symptoms of physical distress. -- Continue to progress workloads to maintain intensity without signs/symptoms of physical distress. Continue to progress workloads to maintain intensity without signs/symptoms of physical distress.   Average METs -- 1.7 -- 1.5 2.2     Resistance Training   Training Prescription -- Yes -- Yes Yes   Weight -- 2 lb -- 2 lb 2 lb   Reps -- 10-15 -- 10-15 10-15     Interval Training   Interval Training -- No -- No No     Oxygen   Oxygen -- Continuous -- Continuous Continuous   Liters -- 4 -- 4 4     Treadmill   MPH -- 1.2 -- -- --   Grade -- 0 -- -- --   Minutes -- 15 -- -- --   METs -- 1.92 -- -- --     NuStep   Level -- 1 -- 1 4   Minutes -- 15 -- 30 30   METs -- 1.5 -- 1.6 2.3     T5 Nustep   Level -- -- -- 1 --   Minutes -- -- -- 15 --   METs -- -- -- 1.7 --     Biostep-RELP   Level -- 1 -- -- --   Minutes -- 15 -- -- --   METs -- 2 -- -- --     Home Exercise Plan  Plans to continue exercise at -- -- Home (comment)  walking Home (comment)  walking Home (comment)  walking   Frequency -- --  Add 2 additional days to program exercise sessions. Add 2 additional days to program exercise sessions. Add 2 additional days to program exercise sessions.   Initial Home Exercises Provided -- -- 11/23/22 11/23/22 11/23/22     Oxygen   Maintain Oxygen Saturation -- 88% or higher -- 88% or higher 88% or higher            Exercise Comments:   Exercise Comments     Row Name 11/04/22 1411           Exercise Comments First full day of exercise!  Patient was oriented to gym and equipment including functions, settings, policies, and procedures.  Patient's individual exercise prescription and treatment plan were reviewed.  All starting workloads were established based on the results of the 6 minute walk test done at initial orientation visit.  The plan for exercise progression was also introduced and progression will be customized based on patient's performance and goals.                Exercise Goals and Review:   Exercise Goals     Row Name 11/02/22 1731             Exercise Goals   Increase Physical Activity Yes       Intervention Provide advice, education, support and counseling about physical activity/exercise needs.;Develop an individualized exercise prescription for aerobic and resistive training based on initial evaluation findings, risk stratification, comorbidities and participant's personal goals.       Expected Outcomes Short Term: Attend rehab on a regular basis to increase amount of physical activity.;Long Term: Add in home exercise to make exercise part of routine and to increase amount of physical activity.;Long Term: Exercising regularly at least 3-5 days a week.       Increase Strength and Stamina Yes       Intervention Provide advice, education, support and counseling about physical activity/exercise needs.;Develop an individualized exercise prescription for aerobic and resistive training based on initial evaluation findings, risk stratification, comorbidities  and participant's personal goals.       Expected Outcomes Short Term: Increase workloads from initial exercise prescription for resistance, speed, and METs.;Short Term: Perform resistance training exercises routinely during rehab and add in resistance training at home;Long Term: Improve cardiorespiratory fitness, muscular endurance and strength as measured by increased METs and functional capacity ( )       Able to understand and use rate of perceived exertion (RPE) scale Yes       Intervention Provide education and explanation on how to use RPE scale       Expected Outcomes Short Term: Able to use RPE daily in rehab to express subjective intensity level;Long Term:  Able to use RPE to guide intensity level when exercising independently       Able to understand and use Dyspnea scale Yes       Intervention Provide education and explanation on how to use Dyspnea scale       Expected Outcomes Short Term: Able to use Dyspnea scale daily in rehab to express subjective sense of shortness of breath during exertion;Long Term: Able to use Dyspnea scale to guide intensity level when exercising independently       Knowledge and understanding of Target Heart Rate Range (THRR) Yes       Intervention Provide education and explanation of THRR  including how the numbers were predicted and where they are located for reference       Expected Outcomes Short Term: Able to state/look up THRR;Long Term: Able to use THRR to govern intensity when exercising independently;Short Term: Able to use daily as guideline for intensity in rehab       Able to check pulse independently Yes       Intervention Provide education and demonstration on how to check pulse in carotid and radial arteries.;Review the importance of being able to check your own pulse for safety during independent exercise       Expected Outcomes Short Term: Able to explain why pulse checking is important during independent exercise       Understanding of Exercise  Prescription Yes       Intervention Provide education, explanation, and written materials on patient's individual exercise prescription       Expected Outcomes Short Term: Able to explain program exercise prescription;Long Term: Able to explain home exercise prescription to exercise independently                Exercise Goals Re-Evaluation :  Exercise Goals Re-Evaluation     Row Name 11/04/22 1411 11/16/22 1426 11/23/22 1545 11/30/22 1401 12/15/22 1525     Exercise Goal Re-Evaluation   Exercise Goals Review Increase Physical Activity;Able to understand and use rate of perceived exertion (RPE) scale;Knowledge and understanding of Target Heart Rate Range (THRR);Understanding of Exercise Prescription;Increase Strength and Stamina;Able to understand and use Dyspnea scale;Able to check pulse independently Increase Physical Activity;Increase Strength and Stamina;Understanding of Exercise Prescription Increase Physical Activity;Increase Strength and Stamina;Understanding of Exercise Prescription Increase Physical Activity;Increase Strength and Stamina;Understanding of Exercise Prescription Increase Physical Activity;Increase Strength and Stamina;Understanding of Exercise Prescription   Comments Reviewed RPE scale, THR and program prescription with pt today.  Pt voiced understanding and was given a copy of goals to take home. Jazzlin is off to a good start in the program. She has done well with the T4 nustep and biostep at level 1. She also was able to walk up to 1.2 mph on the treadmill with no incline. She had an overall average MET level of 1.7 METs during her first three sessions of rehab. We will continue to monitor her progress in the program. Roizy is doing well in rehab.  She was out on vacation for two weeks.  She did walk some with her cane and her rollator.  She has noticed that her saturations will drop with walking so she will need to take a rest break to do her PLB to help hre recover. She was  grateful to be able ot get away.  She also did her PT exercises and strength work while away.  eviewed home exercise with pt today.  Pt plans to walk at home for exercise.  Reviewed THR, pulse, RPE, sign and symptoms, pulse oximetery and when to call 911 or MD.  Also discussed weather considerations and indoor options.  Pt voiced understanding. Also reviewed goals with patient.  Service time [phone] Ticia continues to do well in rehab. She remains at level 1 on the T4 and T5 Nustep, we will encourage patient to increase her workload on the seated machines. Her RPEs are staying appropriate. We will continue to monitor. Tashona is doing well in rehab since returning after having a fall. During her one session in rehab since the last review she was able to work on the T4 nustep for 30 minutes. She was also able  to improve her workload up to level 4 on the T4. She has continued to do well with 2 lb hand weights for resistance training as well. We will continue to monitor her progress in the program.   Expected Outcomes Short: Use RPE daily to regulate intensity.  Long: Follow program prescription in THR. Short: Continue to follow current exercise prescription. Long: Continue to improve strength and stamina. Shrot: Back to routine exercise in rehab Long; Conitnue to improve stamina Short: Increase to level 2 on the T4 Nustep Long: Continue to increase overall MET level and stamina Short: Return to regular attendance in rehab. Long: Continue to increase overall MET level and stamina.            Discharge Exercise Prescription (Final Exercise Prescription Changes):  Exercise Prescription Changes - 12/15/22 1500       Response to Exercise   Blood Pressure (Admit) 124/60    Blood Pressure (Exercise) 118/64    Blood Pressure (Exit) 118/60    Heart Rate (Admit) 69 bpm    Heart Rate (Exercise) 87 bpm    Heart Rate (Exit) 75 bpm    Oxygen Saturation (Admit) 96 %    Oxygen Saturation (Exercise) 88 %    Oxygen  Saturation (Exit) 93 %    Rating of Perceived Exertion (Exercise) 12    Perceived Dyspnea (Exercise) 1    Symptoms SOB    Duration Progress to 30 minutes of  aerobic without signs/symptoms of physical distress    Intensity THRR unchanged      Progression   Progression Continue to progress workloads to maintain intensity without signs/symptoms of physical distress.    Average METs 2.2      Resistance Training   Training Prescription Yes    Weight 2 lb    Reps 10-15      Interval Training   Interval Training No      Oxygen   Oxygen Continuous    Liters 4      NuStep   Level 4    Minutes 30    METs 2.3      Home Exercise Plan   Plans to continue exercise at Home (comment)   walking   Frequency Add 2 additional days to program exercise sessions.    Initial Home Exercises Provided 11/23/22      Oxygen   Maintain Oxygen Saturation 88% or higher             Nutrition:  Target Goals: Understanding of nutrition guidelines, daily intake of sodium 1500mg , cholesterol 200mg , calories 30% from fat and 7% or less from saturated fats, daily to have 5 or more servings of fruits and vegetables.  Education: All About Nutrition: -Group instruction provided by verbal, written material, interactive activities, discussions, models, and posters to present general guidelines for heart healthy nutrition including fat, fiber, MyPlate, the role of sodium in heart healthy nutrition, utilization of the nutrition label, and utilization of this knowledge for meal planning. Follow up email sent as well. Written material given at graduation. Flowsheet Row Pulmonary Rehab from 12/25/2020 in [facility] Cardiac and Pulmonary Rehab  Date 11/20/20  Educator [facility]  Instruction Review Code 1- Verbalizes Understanding       Biometrics:  Pre Biometrics - 11/02/22 1732       Pre Biometrics   Height 5\' 2"  (1.575 m)    Weight 129 lb (58.5 kg)    Waist Circumference 33 inches    Hip Circumference 39 inches  Waist to Hip Ratio 0.85 %    BMI (Calculated) 23.59    Single Leg Stand 2.78 seconds              Nutrition Therapy Plan and Nutrition Goals:  Nutrition Therapy & Goals - 11/02/22 1735       Intervention Plan   Intervention Prescribe, educate and counsel regarding individualized specific dietary modifications aiming towards targeted core components such as weight, hypertension, lipid management, diabetes, heart failure and other comorbidities.    Expected Outcomes Short Term Goal: Understand basic principles of dietary content, such as calories, fat, sodium, cholesterol and nutrients.;Short Term Goal: A plan has been developed with personal nutrition goals set during dietitian appointment.;Long Term Goal: Adherence to prescribed nutrition plan.             Nutrition Assessments:  MEDIFICTS Score Key: ?70 Need to make dietary changes  40-70 Heart Healthy Diet ? 40 Therapeutic Level Cholesterol Diet  Flowsheet Row Pulmonary Rehab from 11/02/2022 in [facility] Cardiac and Pulmonary Rehab  Picture Your Plate Total Score on Admission 81      Picture Your Plate Scores: <47 Unhealthy dietary pattern with much room for improvement. 41-50 Dietary pattern unlikely to meet recommendations for good health and room for improvement. 51-60 More healthful dietary pattern, with some room for improvement.  >60 Healthy dietary pattern, although there may be some specific behaviors that could be improved.   Nutrition Goals Re-Evaluation:  Nutrition Goals Re-Evaluation     Row Name 11/23/22 1551 12/10/22 1603           Goals   Current Weight -- 130 lb (59 kg)      Nutrition Goal Schedule appt with dietitian (husband diabetic) Eat smaller meals      Comment Khari would like to set up an appointment to meet with dietitian.  She would like to work on her diet and her husbands as well.  She is working on watching sodium and getting in a good variety. Patient was informed on why it is  important to maintain a balanced diet when dealing with Respiratory issues. Explained that it takes a lot of energy to breath and when they are short of breath often they will need to have a good diet to help keep up with the calories they are expending for breathing.      Expected Outcome Short: Set up dietitian appt Long: continue to work diet Short: Choose and plan snacks accordingly to patients caloric intake to improve breathing. Long: Maintain a diet independently that meets their caloric intake to aid in daily shortness of breath.               Nutrition Goals Discharge (Final Nutrition Goals Re-Evaluation):  Nutrition Goals Re-Evaluation - 12/10/22 1603       Goals   Current Weight 130 lb (59 kg)    Nutrition Goal Eat smaller meals    Comment Patient was informed on why it is important to maintain a balanced diet when dealing with Respiratory issues. Explained that it takes a lot of energy to breath and when they are short of breath often they will need to have a good diet to help keep up with the calories they are expending for breathing.    Expected Outcome Short: Choose and plan snacks accordingly to patients caloric intake to improve breathing. Long: Maintain a diet independently that meets their caloric intake to aid in daily shortness of breath.  Psychosocial: Target Goals: Acknowledge presence or absence of significant depression and/or stress, maximize coping skills, provide positive support system. Participant is able to verbalize types and ability to use techniques and skills needed for reducing stress and depression.   Education: Stress, Anxiety, and Depression - Group verbal and visual presentation to define topics covered.  Reviews how body is impacted by stress, anxiety, and depression.  Also discusses healthy ways to reduce stress and to treat/manage anxiety and depression.  Written material given at graduation. Flowsheet Row Pulmonary Rehab from  12/25/2020 in [facility] Cardiac and Pulmonary Rehab  Date 10/16/20  Educator [facility]  Instruction Review Code 1- [employer] Understanding       Education: Sleep Hygiene -Provides group verbal and written instruction about how sleep can affect your health.  Define sleep hygiene, discuss sleep cycles and impact of sleep habits. Review good sleep hygiene tips.  Flowsheet Row Cardiac Rehab from 05/30/2018 in [facility] Cardiac and Pulmonary Rehab  Date 03/16/18  Educator [facility]  Instruction Review Code 1- Verbalizes Understanding       Initial Review & Psychosocial Screening:  Initial Psych Review & Screening - 10/28/22 1352       Initial Review   Current issues with Current Anxiety/Panic      Family Dynamics   Good Support System? Yes    Comments Zuzana can look to her husband and her two daughters for support. Her daughters call her almost everyday. She also has alot of faith that gets her through alot.      Barriers   Psychosocial barriers to participate in program The patient should benefit from training in stress management and relaxation.      Screening Interventions   Interventions To provide support and resources with identified psychosocial needs;Encouraged to exercise;Provide feedback about the scores to participant    Expected Outcomes Short Term goal: Utilizing psychosocial counselor, staff and physician to assist with identification of specific Stressors or current issues interfering with healing process. Setting desired goal for each stressor or current issue identified.;Long Term Goal: Stressors or current issues are controlled or eliminated.;Short Term goal: Identification and review with participant of any Quality of Life or Depression concerns found by scoring the questionnaire.;Long Term goal: The participant improves quality of Life and PHQ9 Scores as seen by post scores and/or verbalization of changes             Quality of Life Scores:  Scores of 19 and below usually indicate a  poorer quality of life in these areas.  A difference of  2-3 points is a clinically meaningful difference.  A difference of 2-3 points in the total score of the Quality of Life Index has been associated with significant improvement in overall quality of life, self-image, physical symptoms, and general health in studies assessing change in quality of life.  PHQ-9: Review Flowsheet  More data may exist      11/02/2022 01/02/2021 09/02/2020 05/30/2018 03/08/2018  Depression screen PHQ 2/9  Decreased Interest 0 0 0 0 1  Down, Depressed, Hopeless 0 0 0 0 0  PHQ - 2 Score 0 0 0 0 1  Altered sleeping 1 1 - - 0  Tired, decreased energy 1 1 1 1 1   Change in appetite - 0 1 0 0  Feeling bad or failure about yourself  0 0 0 0 0  Trouble concentrating 0 0 0 0 0  Moving slowly or fidgety/restless 0 0 0 0 0  Suicidal thoughts 0 0 0 0 0  PHQ-9 Score 2 2 - - 2  Difficult doing work/chores - Not difficult at all Not difficult at all - Not difficult at all   Interpretation of Total Score  Total Score Depression Severity:  1-4 = Minimal depression, 5-9 = Mild depression, 10-14 = Moderate depression, 15-19 = Moderately severe depression, 20-27 = Severe depression   Psychosocial Evaluation and Intervention:  Psychosocial Evaluation - 10/28/22 1353       Psychosocial Evaluation & Interventions   Interventions Encouraged to exercise with the program and follow exercise prescription;Relaxation education;Stress management education    Comments Elantra can look to her husband and her two daughters for support. Her daughters call her almost everyday. She also has alot of faith that gets her through alot.    Expected Outcomes Short: Start [name] to help with mood. Long: Maintain a healthy mental state.    Continue Psychosocial Services  Follow up required by staff             Psychosocial Re-Evaluation:  Psychosocial Re-Evaluation     Row Name 11/23/22 1548 12/10/22 1605           Psychosocial  Re-Evaluation   Current issues with Current Stress Concerns None Identified      Comments Laiklynn returned today from a two week vacation.  She got to go to [facility] for vacation.  She enjoyed going away as this was the first time she was able to get away since first getting sick.  She enjoyed getting out and seeing some of the sights and going through gardens as well. She continues to sleep well and feels good overall.  Her biggest frustration is not beinig able to breathe as well with acitvity.  She also admits to forgetting to breathe on occassion when doing things. Patient reports no issues with their current mental states, sleep, stress, depression or anxiety. Will follow up with patient in a few weeks for any changes.      Expected Outcomes short: COntinue to exercise for mood boost Long: Conitnue to stay positive Short: Continue to exercise regularly to support mental health and notify staff of any changes. Long: maintain mental health and well being through teaching of rehab or prescribed medications independently.      Interventions Encouraged to attend Pulmonary Rehabilitation for the exercise Encouraged to attend Pulmonary Rehabilitation for the exercise      Continue Psychosocial Services  -- Follow up required by staff               Psychosocial Discharge (Final Psychosocial Re-Evaluation):  Psychosocial Re-Evaluation - 12/10/22 1605       Psychosocial Re-Evaluation   Current issues with None Identified    Comments Patient reports no issues with their current mental states, sleep, stress, depression or anxiety. Will follow up with patient in a few weeks for any changes.    Expected Outcomes Short: Continue to exercise regularly to support mental health and notify staff of any changes. Long: maintain mental health and well being through teaching of rehab or prescribed medications independently.    Interventions Encouraged to attend Pulmonary Rehabilitation for the exercise     Continue Psychosocial Services  Follow up required by staff             Education: Education Goals: Education classes will be provided on a weekly basis, covering required topics. Participant will state understanding/return demonstration of topics presented.  Learning Barriers/Preferences:  Learning Barriers/Preferences - 10/28/22 1351  Learning Barriers/Preferences   Learning Barriers None    Learning Preferences None             General Pulmonary Education Topics:  Infection Prevention: - Provides verbal and written material to individual with discussion of infection control including proper hand washing and proper equipment cleaning during exercise session. Flowsheet Row Pulmonary Rehab from 11/25/2022 in [facility] Cardiac and Pulmonary Rehab  Date 11/02/22  Educator [facility]  Instruction Review Code 1- Verbalizes Understanding       Falls Prevention: - Provides verbal and written material to individual with discussion of falls prevention and safety. Flowsheet Row Pulmonary Rehab from 11/25/2022 in [facility] Cardiac and Pulmonary Rehab  Date 11/02/22  Educator [facility]  Instruction Review Code 1- Verbalizes Understanding       Chronic Lung Disease Review: - Group verbal instruction with posters, models, PowerPoint presentations and videos,  to review new updates, new respiratory medications, new advancements in procedures and treatments. Providing information on websites and "800" numbers for continued self-education. Includes information about supplement oxygen, available portable oxygen systems, continuous and intermittent flow rates, oxygen safety, concentrators, and Medicare reimbursement for oxygen. Explanation of Pulmonary Drugs, including class, frequency, complications, importance of spacers, rinsing mouth after steroid MDI's, and proper cleaning methods for nebulizers. Review of basic lung anatomy and physiology related to function, structure, and complications of lung  disease. Review of risk factors. Discussion about methods for diagnosing sleep apnea and types of masks and machines for OSA. Includes a review of the use of types of environmental controls: home humidity, furnaces, filters, dust mite/pet prevention, HEPA vacuums. Discussion about weather changes, air quality and the benefits of nasal washing. Instruction on Warning signs, infection symptoms, calling MD promptly, preventive modes, and value of vaccinations. Review of effective airway clearance, coughing and/or vibration techniques. Emphasizing that all should Create an Action Plan. Written material given at graduation. Flowsheet Row Pulmonary Rehab from 11/25/2022 in [facility] Cardiac and Pulmonary Rehab  Education need identified 11/02/22  Date 11/25/22  Educator [facility]  Instruction Review Code 1- Verbalizes Understanding       AED/CPR: - Group verbal and written instruction with the use of models to demonstrate the basic use of the AED with the basic ABC's of resuscitation.    Anatomy and Cardiac Procedures: - Group verbal and visual presentation and models provide information about basic cardiac anatomy and function. Reviews the testing methods done to diagnose heart disease and the outcomes of the test results. Describes the treatment choices: Medical Management, Angioplasty, or Coronary Bypass Surgery for treating various heart conditions including Myocardial Infarction, Angina, Valve Disease, and Cardiac Arrhythmias.  Written material given at graduation. Flowsheet Row Pulmonary Rehab from 12/25/2020 in [facility] Cardiac and Pulmonary Rehab  Date 11/06/20  Educator [facility]  Instruction Review Code 1- Verbalizes Understanding       Medication Safety: - Group verbal and visual instruction to review commonly prescribed medications for heart and lung disease. Reviews the medication, class of the drug, and side effects. Includes the steps to properly store meds and maintain the prescription regimen.  Written  material given at graduation.   Other: -Provides group and verbal instruction on various topics (see comments)   Knowledge Questionnaire Score:  Knowledge Questionnaire Score - 11/02/22 1733       Knowledge Questionnaire Score   Pre Score 17/18              Core Components/Risk Factors/Patient Goals at Admission:  Personal Goals and Risk Factors at Admission -  11/02/22 1735       Core Components/Risk Factors/Patient Goals on Admission    Weight Management Yes;Weight Maintenance    Intervention Weight Management: Develop a combined nutrition and exercise program designed to reach desired caloric intake, while maintaining appropriate intake of nutrient and fiber, sodium and fats, and appropriate energy expenditure required for the weight goal.;Weight Management: Provide education and appropriate resources to help participant work on and attain dietary goals.;Weight Management/Obesity: Establish reasonable short term and long term weight goals.    Admit Weight 129 lb (58.5 kg)    Goal Weight: Short Term 129 lb (58.5 kg)    Goal Weight: Long Term 129 lb (58.5 kg)    Expected Outcomes Short Term: Continue to assess and modify interventions until short term weight is achieved;Long Term: Adherence to nutrition and physical activity/exercise program aimed toward attainment of established weight goal;Understanding recommendations for meals to include 15-35% energy as protein, 25-35% energy from fat, 35-60% energy from carbohydrates, less than 200mg  of dietary cholesterol, 20-35 gm of total fiber daily;Understanding of distribution of calorie intake throughout the day with the consumption of 4-5 meals/snacks;Weight Maintenance: Understanding of the daily nutrition guidelines, which includes 25-35% calories from fat, 7% or less cal from saturated fats, less than 200mg  cholesterol, less than 1.5gm of sodium, & 5 or more servings of fruits and vegetables daily    Improve shortness of breath with  ADL's Yes    Intervention Provide education, individualized exercise plan and daily activity instruction to help decrease symptoms of SOB with activities of daily living.    Expected Outcomes Short Term: Improve cardiorespiratory fitness to achieve a reduction of symptoms when performing ADLs;Long Term: Be able to perform more ADLs without symptoms or delay the onset of symptoms    Heart Failure Yes    Intervention Provide a combined exercise and nutrition program that is supplemented with education, support and counseling about heart failure. Directed toward relieving symptoms such as shortness of breath, decreased exercise tolerance, and extremity edema.    Expected Outcomes Improve functional capacity of life;Short term: Attendance in program 2-3 days a week with increased exercise capacity. Reported lower sodium intake. Reported increased fruit and vegetable intake. Reports medication compliance.;Short term: Daily weights obtained and reported for increase. Utilizing diuretic protocols set by physician.;Long term: Adoption of self-care skills and reduction of barriers for early signs and symptoms recognition and intervention leading to self-care maintenance.    Hypertension Yes    Intervention Provide education on lifestyle modifcations including regular physical activity/exercise, weight management, moderate sodium restriction and increased consumption of fresh fruit, vegetables, and low fat dairy, alcohol moderation, and smoking cessation.;Monitor prescription use compliance.    Expected Outcomes Short Term: Continued assessment and intervention until BP is < 140/80mm HG in hypertensive participants. < 130/62mm HG in hypertensive participants with diabetes, heart failure or chronic kidney disease.;Long Term: Maintenance of blood pressure at goal levels.    Lipids Yes    Intervention Provide education and support for participant on nutrition & aerobic/resistive exercise along with prescribed  medications to achieve LDL 70mg , HDL >40mg .    Expected Outcomes Short Term: Participant states understanding of desired cholesterol values and is compliant with medications prescribed. Participant is following exercise prescription and nutrition guidelines.;Long Term: Cholesterol controlled with medications as prescribed, with individualized exercise RX and with personalized nutrition plan. Value goals: LDL < 70mg , HDL > 40 mg.             Education:Diabetes - Individual verbal and written  instruction to review signs/symptoms of diabetes, desired ranges of glucose level fasting, after meals and with exercise. Acknowledge that pre and post exercise glucose checks will be done for 3 sessions at entry of program.   Know Your Numbers and Heart Failure: - Group verbal and visual instruction to discuss disease risk factors for cardiac and pulmonary disease and treatment options.  Reviews associated critical values for Overweight/Obesity, Hypertension, Cholesterol, and Diabetes.  Discusses basics of heart failure: signs/symptoms and treatments.  Introduces Heart Failure Zone chart for action plan for heart failure.  Written material given at graduation. Flowsheet Row Pulmonary Rehab from 12/25/2020 in [facility] Cardiac and Pulmonary Rehab  Date 12/04/20  Educator [facility]  Instruction Review Code 1- Verbalizes Understanding       Core Components/Risk Factors/Patient Goals Review:   Goals and Risk Factor Review     Row Name 11/23/22 1554 12/10/22 1602           Core Components/Risk Factors/Patient Goals Review   Personal Goals Review Weight Management/Obesity;Improve shortness of breath with ADL's;Increase knowledge of respiratory medications and ability to use respiratory devices properly.;Hypertension;Heart Failure Improve shortness of breath with ADL's      Review Judia is doing well in rehab.  She is up a little to 132 lb from being at beach last two weeks.  She would like to get it back down to  her 129 lb that she is normally at.  She is working on her breathing and does get short of breath with activity and her saturations will drop, especially when she forgets to breathe. She is doing well in her meds and uses her inhalers.  She will also do her breathing exercises at night to help.  She checks her pressures at home and they have been doing well. She does not have any heart failure symptoms currently and feels pretty good overall. Spoke to patient about their shortness of breath and what they can do to improve. Patient has been informed of breathing techniques when starting the program. Patient is informed to tell staff if they have had any med changes and that certain meds they are taking or not taking can be causing shortness of breath.      Expected Outcomes short: Continue to work on getting vacation weight back down Long: conitnue to work on breathing Short: Attend [name] regularly to improve shortness of breath with ADL's. Long: maintain independence with ADL's               Core Components/Risk Factors/Patient Goals at Discharge (Final Review):   Goals and Risk Factor Review - 12/10/22 1602       Core Components/Risk Factors/Patient Goals Review   Personal Goals Review Improve shortness of breath with ADL's    Review Spoke to patient about their shortness of breath and what they can do to improve. Patient has been informed of breathing techniques when starting the program. Patient is informed to tell staff if they have had any med changes and that certain meds they are taking or not taking can be causing shortness of breath.    Expected Outcomes Short: Attend [name] regularly to improve shortness of breath with ADL's. Long: maintain independence with ADL's             ITP Comments:  ITP Comments     Row Name 10/28/22 1348 11/02/22 1725 11/04/22 1411 11/25/22 0829 12/23/22 0918   ITP Comments Virtual Visit completed. Patient informed on EP and RD appointment and 6  Minute  walk test. Patient also informed of patient health questionnaires on My Chart. Patient Verbalizes understanding. Visit diagnosis can be found in [facility] 09/10/[facility]. Completed and gym orientation. Initial ITP created and sent for review to Dr. Jinny Sanders, Medical Director. First full day of exercise!  Patient was oriented to gym and equipment including functions, settings, policies, and procedures.  Patient's individual exercise prescription and treatment plan were reviewed.  All starting workloads were established based on the results of the 6 minute walk test done at initial orientation visit.  The plan for exercise progression was also introduced and progression will be customized based on patient's performance and goals. 30 Day review completed. Medical Director ITP review done, changes made as directed, and signed approval by Medical Director.   new to program 30 Day review completed. Medical Director ITP review done, changes made as directed, and signed approval by Medical Director.            Comments:

## 2022-12-28 ENCOUNTER — Encounter: Payer: Medicare PPO | Admitting: *Deleted

## 2022-12-28 DIAGNOSIS — J849 Interstitial pulmonary disease, unspecified: Secondary | ICD-10-CM | POA: Diagnosis not present

## 2022-12-28 NOTE — Progress Notes (Signed)
Daily Session Note  Patient Details  Name: Joyce Evans MRN: [id number] Date of Birth: Jan 10, 1941 Referring Provider:   Flowsheet Row Pulmonary Rehab from 11/02/2022 in [facility] Cardiac and Pulmonary Rehab  Referring Provider Kloefkorn       Encounter Date: 12/28/2022  Check In:  Session Check In - 12/28/22 1538       Check-In   Supervising physician immediately available to respond to emergencies See telemetry face sheet for immediately available ER MD    Location [facility]-Cardiac & Pulmonary Rehab    Staff Present Susann Givens, RN BSN;Joseph Reino Kent, RCP,RRT,BSRT;Noah Fair[place], [place], Exercise Physiologist    Virtual Visit No    Medication changes reported     No    Fall or balance concerns reported    No    Warm-up and Cool-down Performed on first and last piece of equipment    Resistance Training Performed Yes    VAD Patient? No    PAD/SET Patient? No      Pain Assessment   Currently in Pain? No/denies                Social History   Tobacco Use  Smoking Status Never  Smokeless Tobacco Never    Goals Met:  Independence with exercise equipment Exercise tolerated well No report of concerns or symptoms today Strength training completed today  Goals Unmet:  Not Applicable  Comments: Pt able to follow exercise prescription today without complaint.  Will continue to monitor for progression.    Dr. Bethann Punches is Medical Director for [facility] Cardiac Rehabilitation.  Dr. Vida Rigger is Medical Director for [facility] Pulmonary Rehabilitation.

## 2022-12-30 ENCOUNTER — Encounter: Payer: Medicare PPO | Admitting: *Deleted

## 2022-12-30 DIAGNOSIS — J849 Interstitial pulmonary disease, unspecified: Secondary | ICD-10-CM | POA: Diagnosis not present

## 2022-12-30 NOTE — Progress Notes (Signed)
Daily Session Note  Patient Details  Name: ANALICIA BARTOLUCCI MRN: [id number] Date of Birth: [birth year] Referring Provider:   Flowsheet Row Pulmonary Rehab from 11/02/2022 in [facility] Cardiac and Pulmonary Rehab  Referring Provider Kloefkorn       Encounter Date: 12/30/2022  Check In:  Session Check In - 12/30/22 1540       Check-In   Supervising physician immediately available to respond to emergencies See telemetry face sheet for immediately available ER MD    Location [facility]-Cardiac & Pulmonary Rehab    Staff Present Susann Givens, RN BSN;Joseph Reino Kent, RCP,RRT,BSRT;Megan Katrinka Blazing, RN, [place]    Virtual Visit No    Medication changes reported     No    Fall or balance concerns reported    No    Warm-up and Cool-down Performed on first and last piece of equipment    Resistance Training Performed Yes    VAD Patient? No    PAD/SET Patient? No      Pain Assessment   Currently in Pain? No/denies                Social History   Tobacco Use  Smoking Status Never  Smokeless Tobacco Never    Goals Met:  Independence with exercise equipment Exercise tolerated well No report of concerns or symptoms today Strength training completed today  Goals Unmet:  Not Applicable  Comments: Pt able to follow exercise prescription today without complaint.  Will continue to monitor for progression.    Dr. Bethann Punches is Medical Director for [facility] Cardiac Rehabilitation.  Dr. Vida Rigger is Medical Director for [facility] Pulmonary Rehabilitation.

## 2023-01-04 ENCOUNTER — Encounter: Payer: Medicare PPO | Admitting: *Deleted

## 2023-01-04 DIAGNOSIS — J849 Interstitial pulmonary disease, unspecified: Secondary | ICD-10-CM | POA: Diagnosis not present

## 2023-01-04 NOTE — Progress Notes (Signed)
Daily Session Note  Patient Details  Name: Joyce Evans MRN: [id number] Date of Birth: December 26, 1940 Referring Provider:   Flowsheet Row Pulmonary Rehab from 11/02/2022 in [facility] Cardiac and Pulmonary Rehab  Referring Provider Kloefkorn       Encounter Date: 01/04/2023  Check In:  Session Check In - 01/04/23 1540       Check-In   Supervising physician immediately available to respond to emergencies See telemetry face sheet for immediately available ER MD    Location [facility]-Cardiac & Pulmonary Rehab    Staff Present Susann Givens, RN BSN;Noah Tickle, BS, Exercise Physiologist;Laureen Manson Passey, BS, RRT, CPFT    Virtual Visit No    Medication changes reported     No    Fall or balance concerns reported    No    Warm-up and Cool-down Performed on first and last piece of equipment    Resistance Training Performed Yes    VAD Patient? No    PAD/SET Patient? No      Pain Assessment   Currently in Pain? No/denies                Social History   Tobacco Use  Smoking Status Never  Smokeless Tobacco Never    Goals Met:  Independence with exercise equipment Exercise tolerated well No report of concerns or symptoms today Strength training completed today  Goals Unmet:  Not Applicable  Comments: Pt able to follow exercise prescription today without complaint.  Will continue to monitor for progression.    Dr. Bethann Punches is Medical Director for [facility] Cardiac Rehabilitation.  Dr. Vida Rigger is Medical Director for [facility] Pulmonary Rehabilitation.

## 2023-01-05 ENCOUNTER — Ambulatory Visit: Payer: Medicare PPO | Admitting: Dermatology

## 2023-01-05 ENCOUNTER — Encounter: Payer: Self-pay | Admitting: Dermatology

## 2023-01-05 VITALS — BP 104/65

## 2023-01-05 DIAGNOSIS — D099 Carcinoma in situ, unspecified: Secondary | ICD-10-CM

## 2023-01-05 DIAGNOSIS — W908XXA Exposure to other nonionizing radiation, initial encounter: Secondary | ICD-10-CM

## 2023-01-05 DIAGNOSIS — C44319 Basal cell carcinoma of skin of other parts of face: Secondary | ICD-10-CM | POA: Diagnosis not present

## 2023-01-05 DIAGNOSIS — L570 Actinic keratosis: Secondary | ICD-10-CM

## 2023-01-05 DIAGNOSIS — L578 Other skin changes due to chronic exposure to nonionizing radiation: Secondary | ICD-10-CM

## 2023-01-05 DIAGNOSIS — D0439 Carcinoma in situ of skin of other parts of face: Secondary | ICD-10-CM

## 2023-01-05 DIAGNOSIS — D492 Neoplasm of unspecified behavior of bone, soft tissue, and skin: Secondary | ICD-10-CM

## 2023-01-05 DIAGNOSIS — C44311 Basal cell carcinoma of skin of nose: Secondary | ICD-10-CM

## 2023-01-05 DIAGNOSIS — C4491 Basal cell carcinoma of skin, unspecified: Secondary | ICD-10-CM

## 2023-01-05 DIAGNOSIS — C44310 Basal cell carcinoma of skin of unspecified parts of face: Secondary | ICD-10-CM

## 2023-01-05 HISTORY — DX: Basal cell carcinoma of skin, unspecified: C44.91

## 2023-01-05 HISTORY — DX: Carcinoma in situ, unspecified: D09.9

## 2023-01-05 NOTE — Progress Notes (Signed)
Follow-Up Visit   Subjective  Joyce Evans is a 82 y.o. female who presents for the following: check spots R face, has tried duoderm on R cheek, scaly spot glabella The patient has spots, moles and lesions to be evaluated, some may be new or changing and the patient may have concern these could be cancer.  The following portions of the chart were reviewed this encounter and updated as appropriate: medications, allergies, medical history  Review of Systems:  No other skin or systemic complaints except as noted in HPI or Assessment and Plan.  Objective  Well appearing patient in no apparent distress; mood and affect are within normal limits. A focused examination was performed of the following areas: face Relevant exam findings are noted in the Assessment and Plan.  R preauricular Pink pap 1.1cm     R medial cheek Pink patch 1.0 x 0.6cm     dorsum nose supratip Indurated pap 1.0cm     midline glabella x 1, L cheek x 1 (2) Pink scaly macules   Assessment & Plan   ACTINIC DAMAGE - chronic, secondary to cumulative UV radiation exposure/sun exposure over time - diffuse scaly erythematous macules with underlying dyspigmentation - Recommend daily broad spectrum sunscreen SPF 30+ to sun-exposed areas, reapply every 2 hours as needed.  - Recommend staying in the shade or wearing long sleeves, sun glasses (UVA+UVB protection) and wide brim hats (4-inch brim around the entire circumference of the hat). - Call for new or changing lesions.   Neoplasm of skin (3) R preauricular  Epidermal / dermal shaving  Lesion diameter (cm):  1.1 Informed consent: discussed and consent obtained   Timeout: patient name, date of birth, surgical site, and procedure verified   Procedure prep:  Patient was prepped and draped in usual sterile fashion Prep type:  Isopropyl alcohol Anesthesia: the lesion was anesthetized in a standard fashion   Anesthetic:  1% lidocaine w/ epinephrine  1-100,000 buffered w/ 8.4% NaHCO3 (Discussed epinephrine side effect and pt said Dr. Adriana Simas used epinephrine in her mohs surgery and did not have any problems.) Instrument used: flexible razor blade   Hemostasis achieved with: pressure, aluminum chloride and electrodesiccation   Outcome: patient tolerated procedure well   Post-procedure details: sterile dressing applied and wound care instructions given   Dressing type: bandage and bacitracin    Destruction of lesion Complexity: extensive   Destruction method: electrodesiccation and curettage   Informed consent: discussed and consent obtained   Timeout:  patient name, date of birth, surgical site, and procedure verified Procedure prep:  Patient was prepped and draped in usual sterile fashion Prep type:  Isopropyl alcohol Anesthesia: the lesion was anesthetized in a standard fashion   Anesthetic:  1% lidocaine w/ epinephrine 1-100,000 buffered w/ 8.4% NaHCO3 Curettage performed in three different directions: Yes   Electrodesiccation performed over the curetted area: Yes   Lesion length (cm):  1.1 Lesion width (cm):  1.1 Margin per side (cm):  0.2 Final wound size (cm):  1.5 Hemostasis achieved with:  pressure, aluminum chloride and electrodesiccation Outcome: patient tolerated procedure well with no complications   Post-procedure details: sterile dressing applied and wound care instructions given   Dressing type: bandage and bacitracin    Specimen 1 - Surgical pathology Differential Diagnosis: D48.5 R/O BCC  Check Margins: yes Pink pap 1.1cm EDC  R medial cheek  Skin / nail biopsy Type of biopsy: tangential   Informed consent: discussed and consent obtained   Timeout: patient name,  date of birth, surgical site, and procedure verified   Procedure prep:  Patient was prepped and draped in usual sterile fashion Prep type:  Isopropyl alcohol Anesthesia: the lesion was anesthetized in a standard fashion   Anesthetic:  1% lidocaine  w/ epinephrine 1-100,000 buffered w/ 8.4% NaHCO3 Instrument used: flexible razor blade   Outcome: patient tolerated procedure well   Post-procedure details: sterile dressing applied and wound care instructions given   Dressing type: bandage and bacitracin    Specimen 2 - Surgical pathology Differential Diagnosis: D48.5 R/O BCC  Check Margins: No Pink patch 1.0 x 0.6cm Very small specimen   dorsum nose supratip  Skin / nail biopsy Type of biopsy: tangential   Informed consent: discussed and consent obtained   Timeout: patient name, date of birth, surgical site, and procedure verified   Procedure prep:  Patient was prepped and draped in usual sterile fashion Prep type:  Isopropyl alcohol Anesthesia: the lesion was anesthetized in a standard fashion   Anesthetic:  1% lidocaine w/ epinephrine 1-100,000 buffered w/ 8.4% NaHCO3 Instrument used: flexible razor blade   Outcome: patient tolerated procedure well   Post-procedure details: sterile dressing applied and wound care instructions given   Dressing type: bandage and bacitracin    Specimen 3 - Surgical pathology Differential Diagnosis: D48.5 R/O BCC  Check Margins: No Indurated pap 1.0cm  AK (actinic keratosis) (2) midline glabella x 1, L cheek x 1  Destruction of lesion - midline glabella x 1, L cheek x 1 Complexity: simple   Destruction method: cryotherapy   Informed consent: discussed and consent obtained   Timeout:  patient name, date of birth, surgical site, and procedure verified Lesion destroyed using liquid nitrogen: Yes   Region frozen until ice ball extended beyond lesion: Yes   Outcome: patient tolerated procedure well with no complications   Post-procedure details: wound care instructions given    Related Medications fluorouracil (EFUDEX) 5 % cream Apply topically 2 (two) times daily. On August 15 start cream to nose bid for 7 days  Actinic skin damage    Return for as scheduled for AK f/u, recheck AKs  glabella, L cheek, TBSE, Hx of SCC.  I, Joyce Evans, RMA, am acting as scribe for Armida Sans, MD .  Documentation: I have reviewed the above documentation for accuracy and completeness, and I agree with the above.  Armida Sans, MD

## 2023-01-05 NOTE — Patient Instructions (Addendum)
Wound Care Instructions  Cleanse wound gently with soap and water once a day then pat dry with clean gauze. Apply a thin coat of Petrolatum (petroleum jelly, "Vaseline") over the wound (unless you have an allergy to this). We recommend that you use a new, sterile tube of Vaseline. Do not pick or remove scabs. Do not remove the yellow or white "healing tissue" from the base of the wound.  Cover the wound with fresh, clean, nonstick gauze and secure with paper tape. You may use Band-Aids in place of gauze and tape if the wound is small enough, but would recommend trimming much of the tape off as there is often too much. Sometimes Band-Aids can irritate the skin.  You should call the office for your biopsy report after 1 week if you have not already been contacted.  If you experience any problems, such as abnormal amounts of bleeding, swelling, significant bruising, significant pain, or evidence of infection, please call the office immediately.  FOR ADULT SURGERY PATIENTS: If you need something for pain relief you may take 1 extra strength Tylenol (acetaminophen) AND 2 Ibuprofen (200mg each) together every 4 hours as needed for pain. (do not take these if you are allergic to them or if you have a reason you should not take them.) Typically, you may only need pain medication for 1 to 3 days.      Cryotherapy Aftercare  Wash gently with soap and water everyday.   Apply Vaseline and Band-Aid daily until healed.      Due to recent changes in healthcare laws, you may see results of your pathology and/or laboratory studies on MyChart before the doctors have had a chance to review them. We understand that in some cases there may be results that are confusing or concerning to you. Please understand that not all results are received at the same time and often the doctors may need to interpret multiple results in order to provide you with the best plan of care or course of treatment. Therefore, we ask that  you please give us 2 business days to thoroughly review all your results before contacting the office for clarification. Should we see a critical lab result, you will be contacted sooner.   If You Need Anything After Your Visit  If you have any questions or concerns for your doctor, please call our main line at [phone] and press option 4 to reach your doctor's medical assistant. If no one answers, please leave a voicemail as directed and we will return your call as soon as possible. Messages left after 4 pm will be answered the following business day.   You may also send us a message via MyChart. We typically respond to MyChart messages within 1-2 business days.  For prescription refills, please ask your pharmacy to contact our office. Our fax number is [phone].  If you have an urgent issue when the clinic is closed that cannot wait until the next business day, you can page your doctor at the number below.    Please note that while we do our best to be available for urgent issues outside of office hours, we are not available 24/7.   If you have an urgent issue and are unable to reach us, you may choose to seek medical care at your doctor's office, retail clinic, urgent care center, or emergency room.  If you have a medical emergency, please immediately call 911 or go to the emergency department.  Pager Numbers  - Dr.   Kowalski: [phone]  - Dr. Moye: [phone]  - Dr. Stewart: [phone]  In the event of inclement weather, please call our main line at [phone] for an update on the status of any delays or closures.  Dermatology Medication Tips: Please keep the boxes that topical medications come in in order to help keep track of the instructions about where and how to use these. Pharmacies typically print the medication instructions only on the boxes and not directly on the medication tubes.   If your medication is too expensive, please contact our office at  [phone] option 4 or send us a message through MyChart.   We are unable to tell what your co-pay for medications will be in advance as this is different depending on your insurance coverage. However, we may be able to find a substitute medication at lower cost or fill out paperwork to get insurance to cover a needed medication.   If a prior authorization is required to get your medication covered by your insurance company, please allow us 1-2 business days to complete this process.  Drug prices often vary depending on where the prescription is filled and some pharmacies may offer cheaper prices.  The website www.goodrx.com contains coupons for medications through different pharmacies. The prices here do not account for what the cost may be with help from insurance (it may be cheaper with your insurance), but the website can give you the price if you did not use any insurance.  - You can print the associated coupon and take it with your prescription to the pharmacy.  - You may also stop by our office during regular business hours and pick up a GoodRx coupon card.  - If you need your prescription sent electronically to a different pharmacy, notify our office through [facility] MyChart or by phone at [phone] option 4.     Si Usted Necesita Algo Despus de Su Visita  Tambin puede enviarnos un mensaje a travs de MyChart. Por lo general respondemos a los mensajes de MyChart en el transcurso de 1 a 2 das hbiles.  Para renovar recetas, por favor pida a su farmacia que se ponga en contacto con nuestra oficina. Nuestro nmero de fax es el [phone].  Si tiene un asunto urgente cuando la clnica est cerrada y que no puede esperar hasta el siguiente da hbil, puede llamar/localizar a su doctor(a) al nmero que aparece a continuacin.   Por favor, tenga en cuenta que aunque hacemos todo lo posible para estar disponibles para asuntos urgentes fuera del horario de oficina, no estamos  disponibles las 24 horas del da, los 7 das de la semana.   Si tiene un problema urgente y no puede comunicarse con nosotros, puede optar por buscar atencin mdica  en el consultorio de su doctor(a), en una clnica privada, en un centro de atencin urgente o en una sala de emergencias.  Si tiene una emergencia mdica, por favor llame inmediatamente al 911 o vaya a la sala de emergencias.  Nmeros de bper  - Dr. Kowalski: [phone]  - Dra. Moye: [phone]  - Dra. Stewart: [phone]  En caso de inclemencias del tiempo, por favor llame a nuestra lnea principal al [phone] para una actualizacin sobre el estado de cualquier retraso o cierre.  Consejos para la medicacin en dermatologa: Por favor, guarde las cajas en las que vienen los medicamentos de uso tpico para ayudarle a seguir las instrucciones sobre dnde y cmo usarlos. Las farmacias generalmente imprimen las instrucciones del medicamento slo   en las cajas y no directamente en los tubos del medicamento.   Si su medicamento es muy caro, por favor, pngase en contacto con nuestra oficina llamando al [phone] y presione la opcin 4 o envenos un mensaje a travs de MyChart.   No podemos decirle cul ser su copago por los medicamentos por adelantado ya que esto es diferente dependiendo de la cobertura de su seguro. Sin embargo, es posible que podamos encontrar un medicamento sustituto a menor costo o llenar un formulario para que el seguro cubra el medicamento que se considera necesario.   Si se requiere una autorizacin previa para que su compaa de seguros cubra su medicamento, por favor permtanos de 1 a 2 das hbiles para completar este proceso.  Los precios de los medicamentos varan con frecuencia dependiendo del lugar de dnde se surte la receta y alguna farmacias pueden ofrecer precios ms baratos.  El sitio web www.goodrx.com tiene cupones para medicamentos de diferentes farmacias. Los precios aqu no  tienen en cuenta lo que podra costar con la ayuda del seguro (puede ser ms barato con su seguro), pero el sitio web puede darle el precio si no utiliz ningn seguro.  - Puede imprimir el cupn correspondiente y llevarlo con su receta a la farmacia.  - Tambin puede pasar por nuestra oficina durante el horario de atencin regular y recoger una tarjeta de cupones de GoodRx.  - Si necesita que su receta se enve electrnicamente a una farmacia diferente, informe a nuestra oficina a travs de MyChart de [facility] o por telfono llamando al [phone] y presione la opcin 4.  

## 2023-01-06 ENCOUNTER — Encounter: Payer: Medicare PPO | Admitting: *Deleted

## 2023-01-06 DIAGNOSIS — J849 Interstitial pulmonary disease, unspecified: Secondary | ICD-10-CM | POA: Diagnosis not present

## 2023-01-06 NOTE — Progress Notes (Signed)
Daily Session Note  Patient Details  Name: LEVORA WERDEN MRN: [id number] Date of Birth: 1941-05-25 Referring Provider:   Flowsheet Row Pulmonary Rehab from 11/02/2022 in [facility] Cardiac and Pulmonary Rehab  Referring Provider Kloefkorn       Encounter Date: 01/06/2023  Check In:  Session Check In - 01/06/23 1534       Check-In   Supervising physician immediately available to respond to emergencies See telemetry face sheet for immediately available ER MD    Location [facility]-Cardiac & Pulmonary Rehab    Staff Present Susann Givens, RN BSN;Laureen Manson Passey, BS, RRT, CPFT;Kelly Madilyn Fireman, BS, ACSM CEP, Exercise Physiologist    Virtual Visit No    Medication changes reported     No    Fall or balance concerns reported    No    Warm-up and Cool-down Performed on first and last piece of equipment    Resistance Training Performed Yes    VAD Patient? No    PAD/SET Patient? No      Pain Assessment   Currently in Pain? No/denies                Social History   Tobacco Use  Smoking Status Never  Smokeless Tobacco Never    Goals Met:  Independence with exercise equipment Exercise tolerated well No report of concerns or symptoms today Strength training completed today  Goals Unmet:  Not Applicable  Comments: Pt able to follow exercise prescription today without complaint.  Will continue to monitor for progression.    Dr. Bethann Punches is Medical Director for [facility] Cardiac Rehabilitation.  Dr. Vida Rigger is Medical Director for [facility] Pulmonary Rehabilitation.

## 2023-01-11 ENCOUNTER — Telehealth: Payer: Self-pay

## 2023-01-11 NOTE — Telephone Encounter (Signed)
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

## 2023-01-11 NOTE — Telephone Encounter (Signed)
-----   Message from Deirdre Evener, MD sent at 01/08/2023  6:25 PM EDT ----- Diagnosis 1. Skin , right preauricular BASAL CELL CARCINOMA, NODULAR AND INFILTRATIVE PATTERNS, BASE INVOLVED 2. 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 Skin , dorsum nose supratip BASAL CELL CARCINOMA, INFILTRATIVE PATTERN, BASE INVOLVED  1- Cancer = BCC Already treated Recheck next visit 2 -cancer = SCC in situ Superficial Scheduled for treatment (EDC) 3-cancer= BCC, infiltrative Treatment options to include Mohs surgery versus radiation. If patient knows what she wants to do, may schedule.  If not she may schedule appointment to discuss treatment options at same time as I treat the #2 lesion

## 2023-01-13 ENCOUNTER — Encounter: Payer: Medicare PPO | Attending: Internal Medicine | Admitting: *Deleted

## 2023-01-13 DIAGNOSIS — J849 Interstitial pulmonary disease, unspecified: Secondary | ICD-10-CM | POA: Diagnosis present

## 2023-01-13 NOTE — Progress Notes (Signed)
Daily Session Note  Patient Details  Name: Joyce Evans MRN: [id number] Date of Birth: 1940-11-13 Referring Provider:   Flowsheet Row Pulmonary Rehab from 11/02/2022 in [facility] Cardiac and Pulmonary Rehab  Referring Provider Kloefkorn       Encounter Date: 01/13/2023  Check In:  Session Check In - 01/13/23 1532       Check-In   Supervising physician immediately available to respond to emergencies See telemetry face sheet for immediately available ER MD    Location [facility]-Cardiac & Pulmonary Rehab    Staff Present Susann Givens, RN BSN;Laureen Manson Passey, BS, RRT, CPFT;Megan Katrinka Blazing, RN, ADN    Virtual Visit No    Medication changes reported     No    Fall or balance concerns reported    No    Warm-up and Cool-down Performed on first and last piece of equipment    Resistance Training Performed Yes    VAD Patient? No    PAD/SET Patient? No      Pain Assessment   Currently in Pain? No/denies                Social History   Tobacco Use  Smoking Status Never  Smokeless Tobacco Never    Goals Met:  Independence with exercise equipment Exercise tolerated well No report of concerns or symptoms today Strength training completed today  Goals Unmet:  Not Applicable  Comments: Pt able to follow exercise prescription today without complaint.  Will continue to monitor for progression.    Dr. Bethann Punches is Medical Director for [facility] Cardiac Rehabilitation.  Dr. Vida Rigger is Medical Director for [facility] Pulmonary Rehabilitation.

## 2023-01-18 ENCOUNTER — Encounter: Payer: Medicare PPO | Admitting: *Deleted

## 2023-01-18 DIAGNOSIS — J849 Interstitial pulmonary disease, unspecified: Secondary | ICD-10-CM

## 2023-01-18 NOTE — Progress Notes (Signed)
Daily Session Note  Patient Details  Name: Joyce Evans MRN: [id number] Date of Birth: [birth year] Referring Provider:   Flowsheet Row Pulmonary Rehab from 11/02/2022 in [facility] Cardiac and Pulmonary Rehab  Referring Provider Kloefkorn       Encounter Date: 01/18/2023  Check In:  Session Check In - 01/18/23 1539       Check-In   Supervising physician immediately available to respond to emergencies See telemetry face sheet for immediately available ER MD    Location [facility]-Cardiac & Pulmonary Rehab    Staff Present Susann Givens, RN BSN;Laureen Manson Passey, BS, RRT, CPFT;Susanne Bice, RN, BSN, CCRP    Virtual Visit No    Medication changes reported     No    Fall or balance concerns reported    No    Warm-up and Cool-down Performed on first and last piece of equipment    Resistance Training Performed Yes    VAD Patient? No    PAD/SET Patient? No      Pain Assessment   Currently in Pain? No/denies                Social History   Tobacco Use  Smoking Status Never  Smokeless Tobacco Never    Goals Met:  Independence with exercise equipment Exercise tolerated well No report of concerns or symptoms today Strength training completed today  Goals Unmet:  Not Applicable  Comments: Pt able to follow exercise prescription today without complaint.  Will continue to monitor for progression.    Dr. Bethann Punches is Medical Director for [facility] Cardiac Rehabilitation.  Dr. Vida Rigger is Medical Director for [facility] Pulmonary Rehabilitation.

## 2023-01-19 ENCOUNTER — Encounter: Payer: Self-pay | Admitting: *Deleted

## 2023-01-19 DIAGNOSIS — J849 Interstitial pulmonary disease, unspecified: Secondary | ICD-10-CM

## 2023-01-19 NOTE — Progress Notes (Signed)
Pulmonary Individual Treatment Plan  Patient Details  Name: Joyce Evans MRN: [id number] Date of Birth: March 02, 1941 Referring Provider:   Flowsheet Row Pulmonary Rehab from 11/02/2022 in [facility] Cardiac and Pulmonary Rehab  Referring Provider Kloefkorn       Initial Encounter Date:  Flowsheet Row Pulmonary Rehab from 11/02/2022 in [facility] Cardiac and Pulmonary Rehab  Date 11/02/22       Visit Diagnosis: ILD (interstitial lung disease) (HCC)  Patient's Home Medications on Admission:  Current Outpatient Medications:    acetaminophen (TYLENOL) 500 MG tablet, Take 500 mg by mouth every 6 (six) hours as needed., Disp: , Rfl:    amLODipine (NORVASC) 5 MG tablet, Take 5 mg by mouth daily. (Patient not taking: Reported on 10/16/2021), Disp: , Rfl:    apixaban (ELIQUIS) 5 MG TABS tablet, Take 5 mg by mouth 2 (two) times daily.  (Patient not taking: Reported on 10/28/2022), Disp: , Rfl:    apixaban (ELIQUIS) 5 MG TABS tablet, Take 1 tablet by mouth 2 (two) times daily., Disp: , Rfl:    Apple Cider Vinegar 300 MG TABS, Take 1 tablet by mouth daily., Disp: , Rfl:    atorvastatin (LIPITOR) 40 MG tablet, Take by mouth. (Patient not taking: Reported on 10/28/2022), Disp: , Rfl:    atorvastatin (LIPITOR) 40 MG tablet, Take 1 tablet by mouth daily., Disp: , Rfl:    brimonidine (ALPHAGAN P) 0.1 % SOLN, Place 1 drop into the right eye 2 (two) times daily., Disp: , Rfl:    brimonidine (ALPHAGAN) 0.2 % ophthalmic solution, SMARTSIG:In Eye(s) (Patient not taking: Reported on 10/28/2022), Disp: , Rfl:    Brinzolamide-Brimonidine 1-0.2 % SUSP, Place 1 drop into the left eye 2 (two) times daily., Disp: , Rfl:    carvedilol (COREG) 6.25 MG tablet, Take 1 tablet (6.25 mg total) by mouth 2 (two) times daily with a meal. (Patient not taking: Reported on 10/28/2022), Disp: , Rfl:    cetirizine (ZYRTEC) 10 MG tablet, Take 10 mg by mouth daily., Disp: , Rfl:    Cholecalciferol (VITAMIN D3) 50 MCG (2000 UT) capsule, Take by  mouth., Disp: , Rfl:    Cholecalciferol 50 MCG (2000 UT) CAPS, Take 3 capsules by mouth in the morning. (Patient not taking: Reported on 10/28/2022), Disp: , Rfl:    citalopram (CELEXA) 20 MG tablet, Take 10 mg by mouth daily. (Patient not taking: Reported on 10/28/2022), Disp: , Rfl:    citalopram (CELEXA) 20 MG tablet, Take by mouth., Disp: , Rfl:    famotidine (PEPCID) 40 MG tablet, Take 40 mg by mouth at bedtime., Disp: , Rfl:    fluorouracil (EFUDEX) 5 % cream, Apply topically 2 (two) times daily. On August 15 start cream to nose bid for 7 days (Patient not taking: Reported on 10/28/2022), Disp: 15 g, Rfl: 0   fluticasone (FLONASE) 50 MCG/ACT nasal spray, Place 2 sprays into the nose daily., Disp: , Rfl:    fluticasone (FLONASE) 50 MCG/ACT nasal spray, Place into the nose., Disp: , Rfl:    furosemide (LASIX) 20 MG tablet, Take 20 mg by mouth daily., Disp: , Rfl:    furosemide (LASIX) 20 MG tablet, TAKE 1 TABLET ONE TIME DAILY, Disp: , Rfl:    gabapentin (NEURONTIN) 300 MG capsule, Take 1 capsule (300 mg total) by mouth 2 (two) times daily for 14 days., Disp: 28 capsule, Rfl: 0   losartan (COZAAR) 50 MG tablet, Take 1 tablet by mouth daily. (Patient not taking: Reported on 10/16/2021), Disp: ,  Rfl:    magnesium gluconate 54mg /25ml syringe, Take by mouth., Disp: , Rfl:    methocarbamol (ROBAXIN) 500 MG tablet, Take 1 tablet (500 mg total) by mouth every 8 (eight) hours as needed for muscle spasms., Disp: , Rfl:    multivitamin-iron-minerals-folic acid (CENTRUM) chewable tablet, Chew 1 tablet by mouth daily., Disp: , Rfl:    omeprazole (PRILOSEC) 40 MG capsule, Take by mouth., Disp: , Rfl:    pantoprazole (PROTONIX) 40 MG tablet, Take 40 mg by mouth daily., Disp: , Rfl:    predniSONE (DELTASONE) 1 MG tablet, Take 1 tablet by mouth See admin instructions. Take 9 tablets (9 mg total) by mouth once daily for 14 days, THEN 8 tablets (8 mg total) once daily for 14 days, THEN 7 tablets (7 mg total) once daily  for 14 days, THEN 6 tablets (6 mg total) once daily for 14 days, THEN 5 tablets (5 mg total) once daily for 14 days, THEN 4 tablets (4 mg total) once daily for 14 days, THEN 3 tablets (3 mg total) once daily for 14 days, THEN 2 tablets (2 mg total) once daily for 14 days, THEN 1 tablet (1 mg total) once daily for 14 days., Disp: , Rfl:    Probiotic Product (PROBIOTIC DAILY PO), Take by mouth daily. (Patient not taking: Reported on 10/28/2022), Disp: , Rfl:    senna-docusate (SENOKOT-S) 8.6-50 MG tablet, Take 1 tablet by mouth 2 (two) times daily., Disp: , Rfl:    timolol (BETIMOL) 0.5 % ophthalmic solution, Place 1 drop into both eyes daily., Disp: , Rfl:    White Petrolatum-Mineral Oil (SYSTANE NIGHTTIME) OINT, Apply 1 application. to eye at bedtime., Disp: , Rfl:    ZINC CITRATE PO, Take 1 tablet by mouth 2 (two) times daily. (Patient not taking: Reported on 10/28/2022), Disp: , Rfl:    zinc gluconate 3.75 mg/mL SOLN, Take 1 tablet by mouth 2 (two) times daily., Disp: , Rfl:   Past Medical History: Past Medical History:  Diagnosis Date   Actinic keratosis    Arthritis    osteo - shoulders, fingers   Atrial fibrillation (HCC)    Basal cell carcinoma 01/05/2023   dorsum nose supratip, need treatment mohs or radiation   Basal cell carcinoma (BCC) 01/05/2023   right preauricular, EDC   Dizziness    thinks because of diuretic   Family history of adverse reaction to anesthesia    daughter -PONV   Gallstones 2016, 2017   GERD (gastroesophageal reflux disease)    Glaucoma    Heart murmur    history of   Hypertension    Melanoma (HCC) 04/20/2016   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 Excised: 05/19/2016, margins free.   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   Squamous cell carcinoma of skin    R nasal labial   Tricuspid  valve disorder    leak    Tobacco Use: Social History   Tobacco Use  Smoking Status Never  Smokeless Tobacco Never    Labs: Review Flowsheet       Latest Ref Rng & Units 06/17/2020 06/18/2020  Labs for ITP Cardiac and Pulmonary Rehab  Cholestrol 0 - 200 mg/dL - 474   LDL (calc) 0 - 99 mg/dL - 259  Direct LDL 0 - 99 mg/dL 161.0  -  HDL-C >96 mg/dL - 51   Trlycerides <045 mg/dL - 57   Hemoglobin W0J 4.8 - 5.6 % 5.4  -     Pulmonary Assessment Scores:  Pulmonary Assessment Scores     Row Name 11/02/22 1737         ADL UCSD   ADL Phase Entry     SOB Score total 67     Rest 2     Walk 3     Stairs 4     Bath 2     Dress 2     Shop 3       CAT Score   CAT Score 23       mMRC Score   mMRC Score 2              UCSD: Self-administered rating of dyspnea associated with activities of daily living (ADLs) 6-point scale (0 = "not at all" to 5 = "maximal or unable to do because of breathlessness")  Scoring Scores range from 0 to 120.  Minimally important difference is 5 units  CAT: CAT can identify the health impairment of COPD patients and is better correlated with disease progression.  CAT has a scoring range of zero to 40. The CAT score is classified into four groups of low (less than 10), medium (10 - 20), high (21-30) and very high (31-40) based on the impact level of disease on health status. A CAT score over 10 suggests significant symptoms.  A worsening CAT score could be explained by an exacerbation, poor medication adherence, poor inhaler technique, or progression of COPD or comorbid conditions.  CAT MCID is 2 points  mMRC: mMRC (Modified Medical Research Council) Dyspnea Scale is used to assess the degree of baseline functional disability in patients of respiratory disease due to dyspnea. No minimal important difference is established. A decrease in score of 1 point or greater is considered a positive change.   Pulmonary Function Assessment:  Pulmonary  Function Assessment - 10/28/22 1350       Breath   Shortness of Breath Yes;Limiting activity             Exercise Target Goals: Exercise Program Goal: Individual exercise prescription set using results from initial 6 min walk test and THRR while considering  patient's activity barriers and safety.   Exercise Prescription Goal: Initial exercise prescription builds to 30-45 minutes a day of aerobic activity, 2-3 days per week.  Home exercise guidelines will be given to patient during program as part of exercise prescription that the participant will acknowledge.  Education: Aerobic Exercise: - Group verbal and visual presentation on the components of exercise prescription. Introduces F.I.T.T principle from ACSM for exercise prescriptions.  Reviews F.I.T.T. principles of aerobic exercise including progression. Written material given at graduation.   Education: Resistance Exercise: - Group verbal and visual presentation on the components of exercise prescription. Introduces F.I.T.T principle from ACSM for exercise prescriptions  Reviews F.I.T.T. principles of resistance exercise including progression. Written material given at graduation. Flowsheet Row Pulmonary Rehab from 12/25/2020 in [facility] Cardiac and Pulmonary Rehab  Date 11/06/20  Educator AS  Instruction Review Code 1- Verbalizes Understanding        Education: Exercise & Equipment Safety: - Individual verbal instruction and demonstration of equipment use and safety with use of the equipment. Flowsheet Row Pulmonary Rehab from 12/30/2022 in [facility] Cardiac and Pulmonary Rehab  Date 11/02/22  Educator [facility]  Instruction  Review Code 1- Verbalizes Understanding       Education: Exercise Physiology & General Exercise Guidelines: - Group verbal and written instruction with models to review the exercise physiology of the cardiovascular system and associated critical values. Provides general exercise guidelines with specific guidelines  to those with heart or lung disease.  Flowsheet Row Pulmonary Rehab from 12/25/2020 in [facility] Cardiac and Pulmonary Rehab  Date 10/23/20  Educator AS  Instruction Review Code 1- Verbalizes Understanding       Education: Flexibility, Balance, Mind/Body Relaxation: - Group verbal and visual presentation with interactive activity on the components of exercise prescription. Introduces F.I.T.T principle from ACSM for exercise prescriptions. Reviews F.I.T.T. principles of flexibility and balance exercise training including progression. Also discusses the mind body connection.  Reviews various relaxation techniques to help reduce and manage stress (i.e. Deep breathing, progressive muscle relaxation, and visualization). Balance handout provided to take home. Written material given at graduation. Flowsheet Row Pulmonary Rehab from 12/30/2022 in [facility] Cardiac and Pulmonary Rehab  Date 12/30/22  Educator NT  Instruction Review Code 1- Verbalizes Understanding       Activity Barriers & Risk Stratification:  Activity Barriers & Cardiac Risk Stratification - 11/02/22 1728       Activity Barriers & Cardiac Risk Stratification   Activity Barriers Deconditioning;Muscular Weakness;Shortness of Breath;History of Falls;Balance Concerns;Joint Problems             6 Minute Walk:  6 Minute Walk     Row Name 11/02/22 1726         6 Minute Walk   Phase Initial     Distance 750 feet     Walk Time 6 minutes     # of Rest Breaks 0     MPH 1.42     METS 1.48     RPE 13     Perceived Dyspnea  2     VO2 Peak 5.16     Symptoms No     Resting HR 69 bpm     Resting BP 122/72     Resting Oxygen Saturation  96 %     Exercise Oxygen Saturation  during 6 min walk 85 %     Max Ex. HR 104 bpm     Max Ex. BP 124/72     2 Minute Post BP 114/62       Interval HR   1 Minute HR 89     2 Minute HR 88     3 Minute HR 97     4 Minute HR 99     5 Minute HR 102     6 Minute HR 104     2 Minute Post HR 60      Interval Heart Rate? Yes       Interval Oxygen   Interval Oxygen? Yes     Baseline Oxygen Saturation % 96 %     1 Minute Oxygen Saturation % 92 %     1 Minute Liters of Oxygen 4 L     2 Minute Oxygen Saturation % 87 %     2 Minute Liters of Oxygen 4 L     3 Minute Oxygen Saturation % 87 %     3 Minute Liters of Oxygen 4 L     4 Minute Oxygen Saturation % 85 %     4 Minute Liters of Oxygen 4 L     5 Minute Oxygen Saturation % 85 %     5  Minute Liters of Oxygen 4 L     6 Minute Oxygen Saturation % 86 %     6 Minute Liters of Oxygen 4 L     2 Minute Post Oxygen Saturation % 91 %     2 Minute Post Liters of Oxygen 4 L             Oxygen Initial Assessment:  Oxygen Initial Assessment - 11/02/22 1736       Home Oxygen   Home Oxygen Device Home Concentrator;Portable Concentrator;E-Tanks    Sleep Oxygen Prescription Continuous    Liters per minute 3    Liters per minute 3    Home Resting Oxygen Prescription Continuous    Liters per minute 2    Compliance with Home Oxygen Use Yes      Initial 6 min Walk   Oxygen Used Continuous;Portable Concentrator    Liters per minute 4      Program Oxygen Prescription   Program Oxygen Prescription Continuous    Liters per minute 3      Intervention   Short Term Goals To learn and exhibit compliance with exercise, home and travel O2 prescription;To learn and understand importance of maintaining oxygen saturations>88%;To learn and demonstrate proper use of respiratory medications;To learn and understand importance of monitoring SPO2 with pulse oximeter and demonstrate accurate use of the pulse oximeter.;To learn and demonstrate proper pursed lip breathing techniques or other breathing techniques.     Long  Term Goals Verbalizes importance of monitoring SPO2 with pulse oximeter and return demonstration;Exhibits proper breathing techniques, such as pursed lip breathing or other method taught during program session;Demonstrates proper use of  MDI's;Compliance with respiratory medication;Maintenance of O2 saturations>88%;Exhibits compliance with exercise, home  and travel O2 prescription             Oxygen Re-Evaluation:  Oxygen Re-Evaluation     Row Name 11/04/22 1413 11/23/22 1558 12/10/22 1558         Program Oxygen Prescription   Program Oxygen Prescription -- Continuous Continuous     Liters per minute -- 4 4       Home Oxygen   Home Oxygen Device -- Home Concentrator;Portable Concentrator;E-Tanks Home Concentrator;Portable Concentrator;E-Tanks     Sleep Oxygen Prescription -- Continuous Continuous     Liters per minute -- 4 4     Home Exercise Oxygen Prescription -- Continuous --     Liters per minute -- 4 4     Home Resting Oxygen Prescription -- Continuous Continuous     Liters per minute -- 2  on occasion will take off during rest 2     Compliance with Home Oxygen Use -- Yes Yes       Goals/Expected Outcomes   Short Term Goals -- To learn and exhibit compliance with exercise, home and travel O2 prescription;To learn and understand importance of maintaining oxygen saturations>88%;To learn and demonstrate proper use of respiratory medications;To learn and understand importance of monitoring SPO2 with pulse oximeter and demonstrate accurate use of the pulse oximeter.;To learn and demonstrate proper pursed lip breathing techniques or other breathing techniques.  To learn and understand importance of maintaining oxygen saturations>88%;To learn and understand importance of monitoring SPO2 with pulse oximeter and demonstrate accurate use of the pulse oximeter.     Long  Term Goals -- Verbalizes importance of monitoring SPO2 with pulse oximeter and return demonstration;Exhibits proper breathing techniques, such as pursed lip breathing or other method taught during program session;Demonstrates proper use of MDI's;Compliance  with respiratory medication;Maintenance of O2 saturations>88%;Exhibits compliance with exercise, home   and travel O2 prescription Maintenance of O2 saturations>88%;Verbalizes importance of monitoring SPO2 with pulse oximeter and return demonstration     Comments Reviewed PLB technique with pt.  Talked about how it works and it's importance in maintaining their exercise saturations. Joyce Evans is doing well in rehab. She is compliant with her oxygen and will use 4L with acitvity and 3-4 L at night.   During rest she will occassionaly take it off completely and still be able to maintain her saturations.  She is good about using her PLB to maintain her saturations when she notices it dropping.  She does several breathing exercises as night to help her breathing.  She does note that she has been coughing more and her doctor did start her on Mucinex to help clear muscus and cough. She has a pulse oximeter to check her oxygen saturation at home. Informed and explained why it is important to have one. Reviewed that oxygen saturations should be 88 percent and above. Patient verbalizes understanding.     Goals/Expected Outcomes Short: Become more profiecient at using PLB.   Long: Become independent at using PLB. Short: Continue to monitor saturations and cough Long; Continue to use PLB Short: monitor oxygen at home with exertion. Long: maintain oxygen saturations above 88 percent independently.              Oxygen Discharge (Final Oxygen Re-Evaluation):  Oxygen Re-Evaluation - 12/10/22 1558       Program Oxygen Prescription   Program Oxygen Prescription Continuous    Liters per minute 4      Home Oxygen   Home Oxygen Device Home Concentrator;Portable Concentrator;E-Tanks    Sleep Oxygen Prescription Continuous    Liters per minute 4    Liters per minute 4    Home Resting Oxygen Prescription Continuous    Liters per minute 2    Compliance with Home Oxygen Use Yes      Goals/Expected Outcomes   Short Term Goals To learn and understand importance of maintaining oxygen saturations>88%;To learn and understand  importance of monitoring SPO2 with pulse oximeter and demonstrate accurate use of the pulse oximeter.    Long  Term Goals Maintenance of O2 saturations>88%;Verbalizes importance of monitoring SPO2 with pulse oximeter and return demonstration    Comments She has a pulse oximeter to check her oxygen saturation at home. Informed and explained why it is important to have one. Reviewed that oxygen saturations should be 88 percent and above. Patient verbalizes understanding.    Goals/Expected Outcomes Short: monitor oxygen at home with exertion. Long: maintain oxygen saturations above 88 percent independently.             Initial Exercise Prescription:  Initial Exercise Prescription - 11/02/22 1700       Date of Initial Exercise RX and Referring Provider   Date 11/02/22    Referring Provider Kloefkorn      Oxygen   Oxygen Continuous    Liters 4    Maintain Oxygen Saturation 88% or higher      Treadmill   MPH 1    Grade 0    Minutes 15    METs 1.8      REL-XR   Level 1    Speed 50    Minutes 15    METs 1.48      Biostep-RELP   Level 1    SPM 50    Minutes 15  METs 1.48      Track   Laps 10    Minutes 15    METs 1.54      Prescription Details   Frequency (times per week) 2    Duration Progress to 30 minutes of continuous aerobic without signs/symptoms of physical distress      Intensity   THRR 40-80% of Max Heartrate 97-125    Ratings of Perceived Exertion 11-13    Perceived Dyspnea 0-4      Progression   Progression Continue to progress workloads to maintain intensity without signs/symptoms of physical distress.      Resistance Training   Training Prescription Yes    Weight 2    Reps 10-15             Perform Capillary Blood Glucose checks as needed.  Exercise Prescription Changes:   Exercise Prescription Changes     Row Name 11/02/22 1700 11/16/22 1400 11/23/22 1600 11/30/22 1300 12/15/22 1500     Response to Exercise   Blood Pressure (Admit)  122/72 130/70 -- 128/74 124/60   Blood Pressure (Exercise) 124/72 136/58 -- 120/68 118/64   Blood Pressure (Exit) 114/62 124/72 -- 130/76 118/60   Heart Rate (Admit) 69 bpm 67 bpm -- 68 bpm 69 bpm   Heart Rate (Exercise) 104 bpm 105 bpm -- 105 bpm 87 bpm   Heart Rate (Exit) 72 bpm 78 bpm -- 90 bpm 75 bpm   Oxygen Saturation (Admit) 96 % 98 % -- 98 % 96 %   Oxygen Saturation (Exercise) 85 % 88 % -- 90 % 88 %   Oxygen Saturation (Exit) 97 % 96 % -- 95 % 93 %   Rating of Perceived Exertion (Exercise) 13 13 -- 12 12   Perceived Dyspnea (Exercise) 2 1 -- 2 1   Symptoms none SOB -- SOB SOB   Comments 6 MWT results First three days of exercise -- -- --   Duration -- Progress to 30 minutes of  aerobic without signs/symptoms of physical distress -- Progress to 30 minutes of  aerobic without signs/symptoms of physical distress Progress to 30 minutes of  aerobic without signs/symptoms of physical distress   Intensity -- THRR unchanged -- THRR unchanged THRR unchanged     Progression   Progression -- Continue to progress workloads to maintain intensity without signs/symptoms of physical distress. -- Continue to progress workloads to maintain intensity without signs/symptoms of physical distress. Continue to progress workloads to maintain intensity without signs/symptoms of physical distress.   Average METs -- 1.7 -- 1.5 2.2     Resistance Training   Training Prescription -- Yes -- Yes Yes   Weight -- 2 lb -- 2 lb 2 lb   Reps -- 10-15 -- 10-15 10-15     Interval Training   Interval Training -- No -- No No     Oxygen   Oxygen -- Continuous -- Continuous Continuous   Liters -- 4 -- 4 4     Treadmill   MPH -- 1.2 -- -- --   Grade -- 0 -- -- --   Minutes -- 15 -- -- --   METs -- 1.92 -- -- --     NuStep   Level -- 1 -- 1 4   Minutes -- 15 -- 30 30   METs -- 1.5 -- 1.6 2.3     T5 Nustep   Level -- -- -- 1 --   Minutes -- -- -- 15 --  METs -- -- -- 1.7 --     Biostep-RELP   Level -- 1  -- -- --   Minutes -- 15 -- -- --   METs -- 2 -- -- --     Home Exercise Plan   Plans to continue exercise at -- -- Home (comment)  walking Home (comment)  walking Home (comment)  walking   Frequency -- -- Add 2 additional days to program exercise sessions. Add 2 additional days to program exercise sessions. Add 2 additional days to program exercise sessions.   Initial Home Exercises Provided -- -- 11/23/22 11/23/22 11/23/22     Oxygen   Maintain Oxygen Saturation -- 88% or higher -- 88% or higher 88% or higher    Row Name 12/30/22 1500 01/13/23 0700           Response to Exercise   Blood Pressure (Admit) 118/66 120/62      Blood Pressure (Exercise) 130/72 --      Blood Pressure (Exit) 122/64 130/82      Heart Rate (Admit) 69 bpm 72 bpm      Heart Rate (Exercise) 101 bpm 102 bpm      Heart Rate (Exit) 89 bpm 73 bpm      Oxygen Saturation (Admit) 94 % 95 %      Oxygen Saturation (Exercise) 88 % 88 %      Oxygen Saturation (Exit) 93 % 95 %      Rating of Perceived Exertion (Exercise) 16 14      Perceived Dyspnea (Exercise) 3 3      Symptoms SOB SOB      Duration Progress to 30 minutes of  aerobic without signs/symptoms of physical distress Progress to 30 minutes of  aerobic without signs/symptoms of physical distress      Intensity THRR unchanged THRR unchanged        Progression   Progression Continue to progress workloads to maintain intensity without signs/symptoms of physical distress. Continue to progress workloads to maintain intensity without signs/symptoms of physical distress.      Average METs 1.96 2.1        Resistance Training   Training Prescription Yes Yes      Weight 2 lb 2 lb      Reps 10-15 10-15        Interval Training   Interval Training No No        Oxygen   Oxygen Continuous Continuous      Liters 4 4        Treadmill   MPH -- 1.2      Grade -- 0      Minutes -- 15      METs -- 1.92        NuStep   Level 4 5      Minutes 15 15      METs  1.7 2        REL-XR   Level 1 1      Minutes 15 15      METs 3.9 3.4        T5 Nustep   Level 1 1      Minutes 15 15      METs 3.9 2        Track   Laps 12 --      Minutes 15 --      METs 1.65 --        Home Exercise Plan   Plans to continue exercise  at Home (comment)  walking Home (comment)  walking      Frequency Add 2 additional days to program exercise sessions. Add 2 additional days to program exercise sessions.      Initial Home Exercises Provided 11/23/22 11/23/22        Oxygen   Maintain Oxygen Saturation 88% or higher 88% or higher               Exercise Comments:   Exercise Comments     Row Name 11/04/22 1411           Exercise Comments First full day of exercise!  Patient was oriented to gym and equipment including functions, settings, policies, and procedures.  Patient's individual exercise prescription and treatment plan were reviewed.  All starting workloads were established based on the results of the 6 minute walk test done at initial orientation visit.  The plan for exercise progression was also introduced and progression will be customized based on patient's performance and goals.                Exercise Goals and Review:   Exercise Goals     Row Name 11/02/22 1731             Exercise Goals   Increase Physical Activity Yes       Intervention Provide advice, education, support and counseling about physical activity/exercise needs.;Develop an individualized exercise prescription for aerobic and resistive training based on initial evaluation findings, risk stratification, comorbidities and participant's personal goals.       Expected Outcomes Short Term: Attend rehab on a regular basis to increase amount of physical activity.;Long Term: Add in home exercise to make exercise part of routine and to increase amount of physical activity.;Long Term: Exercising regularly at least 3-5 days a week.       Increase Strength and Stamina Yes        Intervention Provide advice, education, support and counseling about physical activity/exercise needs.;Develop an individualized exercise prescription for aerobic and resistive training based on initial evaluation findings, risk stratification, comorbidities and participant's personal goals.       Expected Outcomes Short Term: Increase workloads from initial exercise prescription for resistance, speed, and METs.;Short Term: Perform resistance training exercises routinely during rehab and add in resistance training at home;Long Term: Improve cardiorespiratory fitness, muscular endurance and strength as measured by increased METs and functional capacity ( )       Able to understand and use rate of perceived exertion (RPE) scale Yes       Intervention Provide education and explanation on how to use RPE scale       Expected Outcomes Short Term: Able to use RPE daily in rehab to express subjective intensity level;Long Term:  Able to use RPE to guide intensity level when exercising independently       Able to understand and use Dyspnea scale Yes       Intervention Provide education and explanation on how to use Dyspnea scale       Expected Outcomes Short Term: Able to use Dyspnea scale daily in rehab to express subjective sense of shortness of breath during exertion;Long Term: Able to use Dyspnea scale to guide intensity level when exercising independently       Knowledge and understanding of Target Heart Rate Range (THRR) Yes       Intervention Provide education and explanation of THRR including how the numbers were predicted and where they are located for reference  Expected Outcomes Short Term: Able to state/look up THRR;Long Term: Able to use THRR to govern intensity when exercising independently;Short Term: Able to use daily as guideline for intensity in rehab       Able to check pulse independently Yes       Intervention Provide education and demonstration on how to check pulse in carotid and  radial arteries.;Review the importance of being able to check your own pulse for safety during independent exercise       Expected Outcomes Short Term: Able to explain why pulse checking is important during independent exercise       Understanding of Exercise Prescription Yes       Intervention Provide education, explanation, and written materials on patient's individual exercise prescription       Expected Outcomes Short Term: Able to explain program exercise prescription;Long Term: Able to explain home exercise prescription to exercise independently                Exercise Goals Re-Evaluation :  Exercise Goals Re-Evaluation     Row Name 11/04/22 1411 11/16/22 1426 11/23/22 1545 11/30/22 1401 12/15/22 1525     Exercise Goal Re-Evaluation   Exercise Goals Review Increase Physical Activity;Able to understand and use rate of perceived exertion (RPE) scale;Knowledge and understanding of Target Heart Rate Range (THRR);Understanding of Exercise Prescription;Increase Strength and Stamina;Able to understand and use Dyspnea scale;Able to check pulse independently Increase Physical Activity;Increase Strength and Stamina;Understanding of Exercise Prescription Increase Physical Activity;Increase Strength and Stamina;Understanding of Exercise Prescription Increase Physical Activity;Increase Strength and Stamina;Understanding of Exercise Prescription Increase Physical Activity;Increase Strength and Stamina;Understanding of Exercise Prescription   Comments Reviewed RPE scale, THR and program prescription with pt today.  Pt voiced understanding and was given a copy of goals to take home. Joyce Evans is off to a good start in the program. She has done well with the T4 nustep and biostep at level 1. She also was able to walk up to 1.2 mph on the treadmill with no incline. She had an overall average MET level of 1.7 METs during her first three sessions of rehab. We will continue to monitor her progress in the program.  Joyce Evans is doing well in rehab.  She was out on vacation for two weeks.  She did walk some with her cane and her rollator.  She has noticed that her saturations will drop with walking so she will need to take a rest break to do her PLB to help hre recover. She was grateful to be able ot get away.  She also did her PT exercises and strength work while away.  eviewed home exercise with pt today.  Pt plans to walk at home for exercise.  Reviewed THR, pulse, RPE, sign and symptoms, pulse oximetery and when to call 911 or MD.  Also discussed weather considerations and indoor options.  Pt voiced understanding. Also reviewed goals with patient.  Service time [id number] Joyce Evans continues to do well in rehab. She remains at level 1 on the T4 and T5 Nustep, we will encourage patient to increase her workload on the seated machines. Her RPEs are staying appropriate. We will continue to monitor. Joyce Evans is doing well in rehab since returning after having a fall. During her one session in rehab since the last review she was able to work on the T4 nustep for 30 minutes. She was also able to improve her workload up to level 4 on the T4. She has continued to do well with 2  lb hand weights for resistance training as well. We will continue to monitor her progress in the program.   Expected Outcomes Short: Use RPE daily to regulate intensity.  Long: Follow program prescription in THR. Short: Continue to follow current exercise prescription. Long: Continue to improve strength and stamina. Shrot: Back to routine exercise in rehab Long; Conitnue to improve stamina Short: Increase to level 2 on the T4 Nustep Long: Continue to increase overall MET level and stamina Short: Return to regular attendance in rehab. Long: Continue to increase overall MET level and stamina.    Row Name 12/30/22 1607 01/13/23 0755           Exercise Goal Re-Evaluation   Exercise Goals Review Increase Physical Activity;Increase Strength and Stamina;Understanding of  Exercise Prescription Increase Physical Activity;Increase Strength and Stamina;Understanding of Exercise Prescription      Comments Joyce Evans is doing well in rehab. She has kept her workloads consistent on all seated machines. She was able to increase her number of laps on the track from 10 to 12 laps. We will continue to monitor her progress in the program. Joyce Evans is doing well in rehab. She recently improved to level 5 on the T4 nustep and began using the treadmill again at a speed of 1.2 mph with no incline. Her O2 saturations have also continued to stay above 88% during exercise. We will continue to monitor her progress in the program.      Expected Outcomes Short: Continue to push for more laps on track. Long: Continue to improve strength and stamina. Short: Continue to progressively increase treadmill speed. Long: Continue to improve strength and stamina.               Discharge Exercise Prescription (Final Exercise Prescription Changes):  Exercise Prescription Changes - 01/13/23 0700       Response to Exercise   Blood Pressure (Admit) 120/62    Blood Pressure (Exit) 130/82    Heart Rate (Admit) 72 bpm    Heart Rate (Exercise) 102 bpm    Heart Rate (Exit) 73 bpm    Oxygen Saturation (Admit) 95 %    Oxygen Saturation (Exercise) 88 %    Oxygen Saturation (Exit) 95 %    Rating of Perceived Exertion (Exercise) 14    Perceived Dyspnea (Exercise) 3    Symptoms SOB    Duration Progress to 30 minutes of  aerobic without signs/symptoms of physical distress    Intensity THRR unchanged      Progression   Progression Continue to progress workloads to maintain intensity without signs/symptoms of physical distress.    Average METs 2.1      Resistance Training   Training Prescription Yes    Weight 2 lb    Reps 10-15      Interval Training   Interval Training No      Oxygen   Oxygen Continuous    Liters 4      Treadmill   MPH 1.2    Grade 0    Minutes 15    METs 1.92      NuStep    Level 5    Minutes 15    METs 2      REL-XR   Level 1    Minutes 15    METs 3.4      T5 Nustep   Level 1    Minutes 15    METs 2      Home Exercise Plan   Plans to continue exercise  at Home (comment)   walking   Frequency Add 2 additional days to program exercise sessions.    Initial Home Exercises Provided 11/23/22      Oxygen   Maintain Oxygen Saturation 88% or higher             Nutrition:  Target Goals: Understanding of nutrition guidelines, daily intake of sodium 1500mg , cholesterol 200mg , calories 30% from fat and 7% or less from saturated fats, daily to have 5 or more servings of fruits and vegetables.  Education: All About Nutrition: -Group instruction provided by verbal, written material, interactive activities, discussions, models, and posters to present general guidelines for heart healthy nutrition including fat, fiber, MyPlate, the role of sodium in heart healthy nutrition, utilization of the nutrition label, and utilization of this knowledge for meal planning. Follow up email sent as well. Written material given at graduation. Flowsheet Row Pulmonary Rehab from 12/25/2020 in [facility] Cardiac and Pulmonary Rehab  Date 11/20/20  Educator [facility]  Instruction Review Code 1- Verbalizes Understanding       Biometrics:  Pre Biometrics - 11/02/22 1732       Pre Biometrics   Height 5\' 2"  (1.575 m)    Weight 129 lb (58.5 kg)    Waist Circumference 33 inches    Hip Circumference 39 inches    Waist to Hip Ratio 0.85 %    BMI (Calculated) 23.59    Single Leg Stand 2.78 seconds              Nutrition Therapy Plan and Nutrition Goals:  Nutrition Therapy & Goals - 11/02/22 1735       Intervention Plan   Intervention Prescribe, educate and counsel regarding individualized specific dietary modifications aiming towards targeted core components such as weight, hypertension, lipid management, diabetes, heart failure and other comorbidities.    Expected Outcomes  Short Term Goal: Understand basic principles of dietary content, such as calories, fat, sodium, cholesterol and nutrients.;Short Term Goal: A plan has been developed with personal nutrition goals set during dietitian appointment.;Long Term Goal: Adherence to prescribed nutrition plan.             Nutrition Assessments:  MEDIFICTS Score Key: ?70 Need to make dietary changes  40-70 Heart Healthy Diet ? 40 Therapeutic Level Cholesterol Diet  Flowsheet Row Pulmonary Rehab from 11/02/2022 in [facility] Cardiac and Pulmonary Rehab  Picture Your Plate Total Score on Admission 81      Picture Your Plate Scores: <09 Unhealthy dietary pattern with much room for improvement. 41-50 Dietary pattern unlikely to meet recommendations for good health and room for improvement. 51-60 More healthful dietary pattern, with some room for improvement.  >60 Healthy dietary pattern, although there may be some specific behaviors that could be improved.   Nutrition Goals Re-Evaluation:  Nutrition Goals Re-Evaluation     Row Name 11/23/22 1551 12/10/22 1603           Goals   Current Weight -- 130 lb (59 kg)      Nutrition Goal Schedule appt with dietitian (husband diabetic) Eat smaller meals      Comment Krisi would like to set up an appointment to meet with dietitian.  She would like to work on her diet and her husbands as well.  She is working on watching sodium and getting in a good variety. Patient was informed on why it is important to maintain a balanced diet when dealing with Respiratory issues. Explained that it takes a lot of energy to breath  and when they are short of breath often they will need to have a good diet to help keep up with the calories they are expending for breathing.      Expected Outcome Short: Set up dietitian appt Long: continue to work diet Short: Choose and plan snacks accordingly to patients caloric intake to improve breathing. Long: Maintain a diet independently that meets their  caloric intake to aid in daily shortness of breath.               Nutrition Goals Discharge (Final Nutrition Goals Re-Evaluation):  Nutrition Goals Re-Evaluation - 12/10/22 1603       Goals   Current Weight 130 lb (59 kg)    Nutrition Goal Eat smaller meals    Comment Patient was informed on why it is important to maintain a balanced diet when dealing with Respiratory issues. Explained that it takes a lot of energy to breath and when they are short of breath often they will need to have a good diet to help keep up with the calories they are expending for breathing.    Expected Outcome Short: Choose and plan snacks accordingly to patients caloric intake to improve breathing. Long: Maintain a diet independently that meets their caloric intake to aid in daily shortness of breath.             Psychosocial: Target Goals: Acknowledge presence or absence of significant depression and/or stress, maximize coping skills, provide positive support system. Participant is able to verbalize types and ability to use techniques and skills needed for reducing stress and depression.   Education: Stress, Anxiety, and Depression - Group verbal and visual presentation to define topics covered.  Reviews how body is impacted by stress, anxiety, and depression.  Also discusses healthy ways to reduce stress and to treat/manage anxiety and depression.  Written material given at graduation. Flowsheet Row Pulmonary Rehab from 12/25/2020 in [facility] Cardiac and Pulmonary Rehab  Date 10/16/20  Educator [facility]  Instruction Review Code 1- [employer] Understanding       Education: Sleep Hygiene -Provides group verbal and written instruction about how sleep can affect your health.  Define sleep hygiene, discuss sleep cycles and impact of sleep habits. Review good sleep hygiene tips.  Flowsheet Row Cardiac Rehab from 05/30/2018 in [facility] Cardiac and Pulmonary Rehab  Date 03/16/18  Educator [facility]  Instruction Review Code 1-  Verbalizes Understanding       Initial Review & Psychosocial Screening:  Initial Psych Review & Screening - 10/28/22 1352       Initial Review   Current issues with Current Anxiety/Panic      Family Dynamics   Good Support System? Yes    Comments Janelis can look to her husband and her two daughters for support. Her daughters call her almost everyday. She also has alot of faith that gets her through alot.      Barriers   Psychosocial barriers to participate in program The patient should benefit from training in stress management and relaxation.      Screening Interventions   Interventions To provide support and resources with identified psychosocial needs;Encouraged to exercise;Provide feedback about the scores to participant    Expected Outcomes Short Term goal: Utilizing psychosocial counselor, staff and physician to assist with identification of specific Stressors or current issues interfering with healing process. Setting desired goal for each stressor or current issue identified.;Long Term Goal: Stressors or current issues are controlled or eliminated.;Short Term goal: Identification and review with participant of  any Quality of Life or Depression concerns found by scoring the questionnaire.;Long Term goal: The participant improves quality of Life and PHQ9 Scores as seen by post scores and/or verbalization of changes             Quality of Life Scores:  Scores of 19 and below usually indicate a poorer quality of life in these areas.  A difference of  2-3 points is a clinically meaningful difference.  A difference of 2-3 points in the total score of the Quality of Life Index has been associated with significant improvement in overall quality of life, self-image, physical symptoms, and general health in studies assessing change in quality of life.  PHQ-9: Review Flowsheet  More data may exist      11/02/2022 01/02/2021 09/02/2020 05/30/2018 03/08/2018  Depression screen PHQ 2/9   Decreased Interest 0 0 0 0 1  Down, Depressed, Hopeless 0 0 0 0 0  PHQ - 2 Score 0 0 0 0 1  Altered sleeping 1 1 - - 0  Tired, decreased energy 1 1 1 1 1   Change in appetite - 0 1 0 0  Feeling bad or failure about yourself  0 0 0 0 0  Trouble concentrating 0 0 0 0 0  Moving slowly or fidgety/restless 0 0 0 0 0  Suicidal thoughts 0 0 0 0 0  PHQ-9 Score 2 2 - - 2  Difficult doing work/chores - Not difficult at all Not difficult at all - Not difficult at all   Interpretation of Total Score  Total Score Depression Severity:  1-4 = Minimal depression, 5-9 = Mild depression, 10-14 = Moderate depression, 15-19 = Moderately severe depression, 20-27 = Severe depression   Psychosocial Evaluation and Intervention:  Psychosocial Evaluation - 10/28/22 1353       Psychosocial Evaluation & Interventions   Interventions Encouraged to exercise with the program and follow exercise prescription;Relaxation education;Stress management education    Comments Nella can look to her husband and her two daughters for support. Her daughters call her almost everyday. She also has alot of faith that gets her through alot.    Expected Outcomes Short: Start [name] to help with mood. Long: Maintain a healthy mental state.    Continue Psychosocial Services  Follow up required by staff             Psychosocial Re-Evaluation:  Psychosocial Re-Evaluation     Row Name 11/23/22 1548 12/10/22 1605           Psychosocial Re-Evaluation   Current issues with Current Stress Concerns None Identified      Comments Omnia returned today from a two week vacation.  She got to go to [facility] for vacation.  She enjoyed going away as this was the first time she was able to get away since first getting sick.  She enjoyed getting out and seeing some of the sights and going through gardens as well. She continues to sleep well and feels good overall.  Her biggest frustration is not beinig able to breathe as well with  acitvity.  She also admits to forgetting to breathe on occassion when doing things. Patient reports no issues with their current mental states, sleep, stress, depression or anxiety. Will follow up with patient in a few weeks for any changes.      Expected Outcomes short: COntinue to exercise for mood boost Long: Conitnue to stay positive Short: Continue to exercise regularly to support mental health and notify staff  of any changes. Long: maintain mental health and well being through teaching of rehab or prescribed medications independently.      Interventions Encouraged to attend Pulmonary Rehabilitation for the exercise Encouraged to attend Pulmonary Rehabilitation for the exercise      Continue Psychosocial Services  -- Follow up required by staff               Psychosocial Discharge (Final Psychosocial Re-Evaluation):  Psychosocial Re-Evaluation - 12/10/22 1605       Psychosocial Re-Evaluation   Current issues with None Identified    Comments Patient reports no issues with their current mental states, sleep, stress, depression or anxiety. Will follow up with patient in a few weeks for any changes.    Expected Outcomes Short: Continue to exercise regularly to support mental health and notify staff of any changes. Long: maintain mental health and well being through teaching of rehab or prescribed medications independently.    Interventions Encouraged to attend Pulmonary Rehabilitation for the exercise    Continue Psychosocial Services  Follow up required by staff             Education: Education Goals: Education classes will be provided on a weekly basis, covering required topics. Participant will state understanding/return demonstration of topics presented.  Learning Barriers/Preferences:  Learning Barriers/Preferences - 10/28/22 1351       Learning Barriers/Preferences   Learning Barriers None    Learning Preferences None             General Pulmonary Education  Topics:  Infection Prevention: - Provides verbal and written material to individual with discussion of infection control including proper hand washing and proper equipment cleaning during exercise session. Flowsheet Row Pulmonary Rehab from 12/30/2022 in [facility] Cardiac and Pulmonary Rehab  Date 11/02/22  Educator [facility]  Instruction Review Code 1- Verbalizes Understanding       Falls Prevention: - Provides verbal and written material to individual with discussion of falls prevention and safety. Flowsheet Row Pulmonary Rehab from 12/30/2022 in [facility] Cardiac and Pulmonary Rehab  Date 11/02/22  Educator [facility]  Instruction Review Code 1- Verbalizes Understanding       Chronic Lung Disease Review: - Group verbal instruction with posters, models, PowerPoint presentations and videos,  to review new updates, new respiratory medications, new advancements in procedures and treatments. Providing information on websites and "800" numbers for continued self-education. Includes information about supplement oxygen, available portable oxygen systems, continuous and intermittent flow rates, oxygen safety, concentrators, and Medicare reimbursement for oxygen. Explanation of Pulmonary Drugs, including class, frequency, complications, importance of spacers, rinsing mouth after steroid MDI's, and proper cleaning methods for nebulizers. Review of basic lung anatomy and physiology related to function, structure, and complications of lung disease. Review of risk factors. Discussion about methods for diagnosing sleep apnea and types of masks and machines for OSA. Includes a review of the use of types of environmental controls: home humidity, furnaces, filters, dust mite/pet prevention, HEPA vacuums. Discussion about weather changes, air quality and the benefits of nasal washing. Instruction on Warning signs, infection symptoms, calling MD promptly, preventive modes, and value of vaccinations. Review of effective airway clearance,  coughing and/or vibration techniques. Emphasizing that all should Create an Action Plan. Written material given at graduation. Flowsheet Row Pulmonary Rehab from 12/30/2022 in [facility] Cardiac and Pulmonary Rehab  Education need identified 11/02/22  Date 11/25/22  Educator [facility]  Instruction Review Code 1- Verbalizes Understanding       AED/CPR: - Group verbal and written  instruction with the use of models to demonstrate the basic use of the AED with the basic ABC's of resuscitation.    Anatomy and Cardiac Procedures: - Group verbal and visual presentation and models provide information about basic cardiac anatomy and function. Reviews the testing methods done to diagnose heart disease and the outcomes of the test results. Describes the treatment choices: Medical Management, Angioplasty, or Coronary Bypass Surgery for treating various heart conditions including Myocardial Infarction, Angina, Valve Disease, and Cardiac Arrhythmias.  Written material given at graduation. Flowsheet Row Pulmonary Rehab from 12/25/2020 in [facility] Cardiac and Pulmonary Rehab  Date 11/06/20  Educator [facility]  Instruction Review Code 1- Verbalizes Understanding       Medication Safety: - Group verbal and visual instruction to review commonly prescribed medications for heart and lung disease. Reviews the medication, class of the drug, and side effects. Includes the steps to properly store meds and maintain the prescription regimen.  Written material given at graduation.   Other: -Provides group and verbal instruction on various topics (see comments)   Knowledge Questionnaire Score:  Knowledge Questionnaire Score - 11/02/22 1733       Knowledge Questionnaire Score   Pre Score 17/18              Core Components/Risk Factors/Patient Goals at Admission:  Personal Goals and Risk Factors at Admission - 11/02/22 1735       Core Components/Risk Factors/Patient Goals on Admission    Weight Management Yes;Weight  Maintenance    Intervention Weight Management: Develop a combined nutrition and exercise program designed to reach desired caloric intake, while maintaining appropriate intake of nutrient and fiber, sodium and fats, and appropriate energy expenditure required for the weight goal.;Weight Management: Provide education and appropriate resources to help participant work on and attain dietary goals.;Weight Management/Obesity: Establish reasonable short term and long term weight goals.    Admit Weight 129 lb (58.5 kg)    Goal Weight: Short Term 129 lb (58.5 kg)    Goal Weight: Long Term 129 lb (58.5 kg)    Expected Outcomes Short Term: Continue to assess and modify interventions until short term weight is achieved;Long Term: Adherence to nutrition and physical activity/exercise program aimed toward attainment of established weight goal;Understanding recommendations for meals to include 15-35% energy as protein, 25-35% energy from fat, 35-60% energy from carbohydrates, less than 200mg  of dietary cholesterol, 20-35 gm of total fiber daily;Understanding of distribution of calorie intake throughout the day with the consumption of 4-5 meals/snacks;Weight Maintenance: Understanding of the daily nutrition guidelines, which includes 25-35% calories from fat, 7% or less cal from saturated fats, less than 200mg  cholesterol, less than 1.5gm of sodium, & 5 or more servings of fruits and vegetables daily    Improve shortness of breath with ADL's Yes    Intervention Provide education, individualized exercise plan and daily activity instruction to help decrease symptoms of SOB with activities of daily living.    Expected Outcomes Short Term: Improve cardiorespiratory fitness to achieve a reduction of symptoms when performing ADLs;Long Term: Be able to perform more ADLs without symptoms or delay the onset of symptoms    Heart Failure Yes    Intervention Provide a combined exercise and nutrition program that is supplemented with  education, support and counseling about heart failure. Directed toward relieving symptoms such as shortness of breath, decreased exercise tolerance, and extremity edema.    Expected Outcomes Improve functional capacity of life;Short term: Attendance in program 2-3 days a week with increased exercise  capacity. Reported lower sodium intake. Reported increased fruit and vegetable intake. Reports medication compliance.;Short term: Daily weights obtained and reported for increase. Utilizing diuretic protocols set by physician.;Long term: Adoption of self-care skills and reduction of barriers for early signs and symptoms recognition and intervention leading to self-care maintenance.    Hypertension Yes    Intervention Provide education on lifestyle modifcations including regular physical activity/exercise, weight management, moderate sodium restriction and increased consumption of fresh fruit, vegetables, and low fat dairy, alcohol moderation, and smoking cessation.;Monitor prescription use compliance.    Expected Outcomes Short Term: Continued assessment and intervention until BP is < 140/61mm HG in hypertensive participants. < 130/56mm HG in hypertensive participants with diabetes, heart failure or chronic kidney disease.;Long Term: Maintenance of blood pressure at goal levels.    Lipids Yes    Intervention Provide education and support for participant on nutrition & aerobic/resistive exercise along with prescribed medications to achieve LDL 70mg , HDL >40mg .    Expected Outcomes Short Term: Participant states understanding of desired cholesterol values and is compliant with medications prescribed. Participant is following exercise prescription and nutrition guidelines.;Long Term: Cholesterol controlled with medications as prescribed, with individualized exercise RX and with personalized nutrition plan. Value goals: LDL < 70mg , HDL > 40 mg.             Education:Diabetes - Individual verbal and written  instruction to review signs/symptoms of diabetes, desired ranges of glucose level fasting, after meals and with exercise. Acknowledge that pre and post exercise glucose checks will be done for 3 sessions at entry of program.   Know Your Numbers and Heart Failure: - Group verbal and visual instruction to discuss disease risk factors for cardiac and pulmonary disease and treatment options.  Reviews associated critical values for Overweight/Obesity, Hypertension, Cholesterol, and Diabetes.  Discusses basics of heart failure: signs/symptoms and treatments.  Introduces Heart Failure Zone chart for action plan for heart failure.  Written material given at graduation. Flowsheet Row Pulmonary Rehab from 12/25/2020 in [facility] Cardiac and Pulmonary Rehab  Date 12/04/20  [facility]  Instruction Review Code 1- Verbalizes Understanding       Core Components/Risk Factors/Patient Goals Review:   Goals and Risk Factor Review     Row Name 11/23/22 1554 12/10/22 1602           Core Components/Risk Factors/Patient Goals Review   Personal Goals Review Weight Management/Obesity;Improve shortness of breath with ADL's;Increase knowledge of respiratory medications and ability to use respiratory devices properly.;Hypertension;Heart Failure Improve shortness of breath with ADL's      Review Joyce Evans is doing well in rehab.  She is up a little to 132 lb from being at beach last two weeks.  She would like to get it back down to her 129 lb that she is normally at.  She is working on her breathing and does get short of breath with activity and her saturations will drop, especially when she forgets to breathe. She is doing well in her meds and uses her inhalers.  She will also do her breathing exercises at night to help.  She checks her pressures at home and they have been doing well. She does not have any heart failure symptoms currently and feels pretty good overall. Spoke to patient about their shortness of breath and what they  can do to improve. Patient has been informed of breathing techniques when starting the program. Patient is informed to tell staff if they have had any med changes and that certain meds they  are taking or not taking can be causing shortness of breath.      Expected Outcomes short: Continue to work on getting vacation weight back down Long: conitnue to work on breathing Short: Attend [name] regularly to improve shortness of breath with ADL's. Long: maintain independence with ADL's               Core Components/Risk Factors/Patient Goals at Discharge (Final Review):   Goals and Risk Factor Review - 12/10/22 1602       Core Components/Risk Factors/Patient Goals Review   Personal Goals Review Improve shortness of breath with ADL's    Review Spoke to patient about their shortness of breath and what they can do to improve. Patient has been informed of breathing techniques when starting the program. Patient is informed to tell staff if they have had any med changes and that certain meds they are taking or not taking can be causing shortness of breath.    Expected Outcomes Short: Attend [name] regularly to improve shortness of breath with ADL's. Long: maintain independence with ADL's             ITP Comments:  ITP Comments     Row Name 10/28/22 1348 11/02/22 1725 11/04/22 1411 11/25/22 0829 12/23/22 0918   ITP Comments Virtual Visit completed. Patient informed on EP and RD appointment and 6 Minute walk test. Patient also informed of patient health questionnaires on My Chart. Patient Verbalizes understanding. Visit diagnosis can be found in [facility] 09/10/2022. Completed and gym orientation. Initial ITP created and sent for review to Dr. Jinny Sanders, Medical Director. First full day of exercise!  Patient was oriented to gym and equipment including functions, settings, policies, and procedures.  Patient's individual exercise prescription and treatment plan were reviewed.  All starting  workloads were established based on the results of the 6 minute walk test done at initial orientation visit.  The plan for exercise progression was also introduced and progression will be customized based on patient's performance and goals. 30 Day review completed. Medical Director ITP review done, changes made as directed, and signed approval by Medical Director.   new to program 30 Day review completed. Medical Director ITP review done, changes made as directed, and signed approval by Medical Director.    Row Name 01/19/23 1418           ITP Comments 30 Day review completed. Medical Director ITP review done, changes made as directed, and signed approval by Medical Director.                Comments:

## 2023-01-20 ENCOUNTER — Encounter: Payer: Medicare PPO | Admitting: *Deleted

## 2023-01-20 DIAGNOSIS — J849 Interstitial pulmonary disease, unspecified: Secondary | ICD-10-CM

## 2023-01-20 NOTE — Progress Notes (Signed)
Daily Session Note  Patient Details  Name: Joyce Evans MRN: [id number] Date of Birth: 10-Apr-1941 Referring Provider:   Flowsheet Row Pulmonary Rehab from 11/02/2022 in [facility] Cardiac and Pulmonary Rehab  Referring Provider Kloefkorn       Encounter Date: 01/20/2023  Check In:  Session Check In - 01/20/23 1543       Check-In   Supervising physician immediately available to respond to emergencies See telemetry face sheet for immediately available ER MD    Location [facility]-Cardiac & Pulmonary Rehab    Staff Present Elige Ko, RCP,RRT,BSRT;Velvet Moomaw Jewel Baize, RN BSN;Laureen Manson Passey, BS, RRT, CPFT    Virtual Visit No    Medication changes reported     No    Fall or balance concerns reported    No    Warm-up and Cool-down Performed on first and last piece of equipment    Resistance Training Performed Yes    VAD Patient? No    PAD/SET Patient? No      Pain Assessment   Currently in Pain? No/denies                Social History   Tobacco Use  Smoking Status Never  Smokeless Tobacco Never    Goals Met:  Independence with exercise equipment Exercise tolerated well No report of concerns or symptoms today Strength training completed today  Goals Unmet:  Not Applicable  Comments: Pt able to follow exercise prescription today without complaint.  Will continue to monitor for progression.    Dr. Bethann Punches is Medical Director for [facility] Cardiac Rehabilitation.  Dr. Vida Rigger is Medical Director for [facility] Pulmonary Rehabilitation.

## 2023-01-25 ENCOUNTER — Encounter: Payer: Medicare PPO | Admitting: *Deleted

## 2023-01-25 DIAGNOSIS — J849 Interstitial pulmonary disease, unspecified: Secondary | ICD-10-CM

## 2023-01-25 NOTE — Progress Notes (Signed)
Daily Session Note  Patient Details  Name: Joyce Evans MRN: [id number] Date of Birth: Oct 14, 1940 Referring Provider:   Flowsheet Row Pulmonary Rehab from 11/02/2022 in [facility] Cardiac and Pulmonary Rehab  Referring Provider Kloefkorn       Encounter Date: 01/25/2023  Check In:  Session Check In - 01/25/23 1549       Check-In   Supervising physician immediately available to respond to emergencies See telemetry face sheet for immediately available ER MD    Location [facility]-Cardiac & Pulmonary Rehab    Staff Present Susann Givens, RN BSN;Laureen Manson Passey, BS, RRT, CPFT;Kelly Madilyn Fireman, BS, ACSM CEP, Exercise Physiologist    Virtual Visit No    Medication changes reported     No    Fall or balance concerns reported    No    Warm-up and Cool-down Performed on first and last piece of equipment    Resistance Training Performed Yes    VAD Patient? No    PAD/SET Patient? No      Pain Assessment   Currently in Pain? No/denies                Social History   Tobacco Use  Smoking Status Never  Smokeless Tobacco Never    Goals Met:  Independence with exercise equipment Exercise tolerated well No report of concerns or symptoms today Strength training completed today  Goals Unmet:  Not Applicable  Comments: Pt able to follow exercise prescription today without complaint.  Will continue to monitor for progression.    Dr. Bethann Punches is Medical Director for [facility] Cardiac Rehabilitation.  Dr. Vida Rigger is Medical Director for [facility] Pulmonary Rehabilitation.

## 2023-01-27 ENCOUNTER — Encounter: Payer: Medicare PPO | Admitting: *Deleted

## 2023-01-27 DIAGNOSIS — J849 Interstitial pulmonary disease, unspecified: Secondary | ICD-10-CM

## 2023-01-27 NOTE — Progress Notes (Signed)
Assessment: 2:33 PM  Questions/concerns: "on low Na, low carb diet" Accessibility to food no concerns Digestive issues/concerns no known food allergies, no known food allergies  24-hours Recall: B: scrambled eggs or fried egg on toast, fruits like berries/cherries, crunchy peanut butter toast, drinks premier protein shake L: half tomato sandwich D: pinto beans, cabbage, left over chicken pie, dinner roll  Beverages water  Intake Patterns tries to eat all of her breakfast, but sometimes its too much.   Education r/t nutrition plan: Patient drinking mostly water. Reviewed 24hr food recall. She struggles to get larger meals in, her husband wants to make sure she gets her nutrition in and makes her large meals with good foods. Talked about her food choices are good, but that eating smaller more frequent meals will help her get more nutrition in without feeling bloated or overwhelmed with the quantity of food. Educated on healthy fats, their nutrient and calorie density. Encouraged her to break up her large breakfast into smaller meals/snacks with complex carbs paired with quality protein or healthy fat. Reviewed mediterranean diet handout, built out several meals and snacks with foods she likes and will eat.   Goal 1: Eat smaller more frequent meals Goal 2: Pair carbs with protein/fat  3:08 PM

## 2023-01-27 NOTE — Progress Notes (Signed)
Daily Session Note  Patient Details  Name: Joyce Evans MRN: [id number] Date of Birth: 24-Feb-1941 Referring Provider:   Flowsheet Row Pulmonary Rehab from 11/02/2022 in [facility] Cardiac and Pulmonary Rehab  Referring Provider Kloefkorn       Encounter Date: 01/27/2023  Check In:  Session Check In - 01/27/23 1531       Check-In   Supervising physician immediately available to respond to emergencies See telemetry face sheet for immediately available ER MD    Location [facility]-Cardiac & Pulmonary Rehab    Staff Present Susann Givens, RN BSN;Joseph Randall, RCP,RRT,BSRT;Laureen [place], [place], RRT, CPFT    Virtual Visit No    Medication changes reported     No    Fall or balance concerns reported    No    Warm-up and Cool-down Performed on first and last piece of equipment    Resistance Training Performed Yes    VAD Patient? No    PAD/SET Patient? No      Pain Assessment   Currently in Pain? No/denies                Social History   Tobacco Use  Smoking Status Never  Smokeless Tobacco Never    Goals Met:  Independence with exercise equipment Exercise tolerated well No report of concerns or symptoms today Strength training completed today  Goals Unmet:  Not Applicable  Comments: Pt able to follow exercise prescription today without complaint.  Will continue to monitor for progression.    Dr. Bethann Punches is Medical Director for [facility] Cardiac Rehabilitation.  Dr. Vida Rigger is Medical Director for [facility] Pulmonary Rehabilitation.

## 2023-02-02 ENCOUNTER — Other Ambulatory Visit: Payer: Self-pay

## 2023-02-02 ENCOUNTER — Encounter: Payer: Self-pay | Admitting: Dermatology

## 2023-02-02 ENCOUNTER — Ambulatory Visit: Payer: Medicare PPO | Admitting: Dermatology

## 2023-02-02 DIAGNOSIS — C44311 Basal cell carcinoma of skin of nose: Secondary | ICD-10-CM | POA: Diagnosis not present

## 2023-02-02 DIAGNOSIS — Z85828 Personal history of other malignant neoplasm of skin: Secondary | ICD-10-CM

## 2023-02-02 DIAGNOSIS — L578 Other skin changes due to chronic exposure to nonionizing radiation: Secondary | ICD-10-CM | POA: Diagnosis not present

## 2023-02-02 DIAGNOSIS — W908XXA Exposure to other nonionizing radiation, initial encounter: Secondary | ICD-10-CM

## 2023-02-02 DIAGNOSIS — L820 Inflamed seborrheic keratosis: Secondary | ICD-10-CM

## 2023-02-02 DIAGNOSIS — Z7189 Other specified counseling: Secondary | ICD-10-CM

## 2023-02-02 DIAGNOSIS — D0439 Carcinoma in situ of skin of other parts of face: Secondary | ICD-10-CM | POA: Diagnosis not present

## 2023-02-02 DIAGNOSIS — L570 Actinic keratosis: Secondary | ICD-10-CM | POA: Diagnosis not present

## 2023-02-02 DIAGNOSIS — D099 Carcinoma in situ, unspecified: Secondary | ICD-10-CM

## 2023-02-02 DIAGNOSIS — L821 Other seborrheic keratosis: Secondary | ICD-10-CM

## 2023-02-02 DIAGNOSIS — L814 Other melanin hyperpigmentation: Secondary | ICD-10-CM

## 2023-02-02 NOTE — Patient Instructions (Signed)
Electrodesiccation and Curettage ("Scrape and Burn") Wound Care Instructions  Leave the original bandage on for 24 hours if possible.  If the bandage becomes soaked or soiled before that time, it is OK to remove it and examine the wound.  A small amount of post-operative bleeding is normal.  If excessive bleeding occurs, remove the bandage, place gauze over the site and apply continuous pressure (no peeking) over the area for 30 minutes. If this does not work, please call our clinic as soon as possible or page your doctor if it is after hours.   Once a day, cleanse the wound with soap and water. It is fine to shower. If a thick crust develops you may use a Q-tip dipped into dilute hydrogen peroxide (mix 1:1 with water) to dissolve it.  Hydrogen peroxide can slow the healing process, so use it only as needed.    After washing, apply petroleum jelly (Vaseline) or an antibiotic ointment if your doctor prescribed one for you, followed by a bandage.    For best healing, the wound should be covered with a layer of ointment at all times. If you are not able to keep the area covered with a bandage to hold the ointment in place, this may mean re-applying the ointment several times a day.  Continue this wound care until the wound has healed and is no longer open. It may take several weeks for the wound to heal and close.  Itching and mild discomfort is normal during the healing process.  If you have any discomfort, you can take Tylenol (acetaminophen) or ibuprofen as directed on the bottle. (Please do not take these if you have an allergy to them or cannot take them for another reason).  Some redness, tenderness and white or yellow material in the wound is normal healing.  If the area becomes very sore and red, or develops a thick yellow-green material (pus), it may be infected; please notify us.    Wound healing continues for up to one year following surgery. It is not unusual to experience pain in the scar  from time to time during the interval.  If the pain becomes severe or the scar thickens, you should notify the office.    A slight amount of redness in a scar is expected for the first six months.  After six months, the redness will fade and the scar will soften and fade.  The color difference becomes less noticeable with time.  If there are any problems, return for a post-op surgery check at your earliest convenience.  To improve the appearance of the scar, you can use silicone scar gel, cream, or sheets (such as Mederma or Serica) every night for up to one year. These are available over the counter (without a prescription).  Please call our office at [phone] for any questions or concerns.     Due to recent changes in healthcare laws, you may see results of your pathology and/or laboratory studies on MyChart before the doctors have had a chance to review them. We understand that in some cases there may be results that are confusing or concerning to you. Please understand that not all results are received at the same time and often the doctors may need to interpret multiple results in order to provide you with the best plan of care or course of treatment. Therefore, we ask that you please give us 2 business days to thoroughly review all your results before contacting the office for clarification. Should   we see a critical lab result, you will be contacted sooner.   If You Need Anything After Your Visit  If you have any questions or concerns for your doctor, please call our main line at [phone] and press option 4 to reach your doctor's medical assistant. If no one answers, please leave a voicemail as directed and we will return your call as soon as possible. Messages left after 4 pm will be answered the following business day.   You may also send us a message via MyChart. We typically respond to MyChart messages within 1-2 business days.  For prescription refills, please ask your  pharmacy to contact our office. Our fax number is [phone].  If you have an urgent issue when the clinic is closed that cannot wait until the next business day, you can page your doctor at the number below.    Please note that while we do our best to be available for urgent issues outside of office hours, we are not available 24/7.   If you have an urgent issue and are unable to reach us, you may choose to seek medical care at your doctor's office, retail clinic, urgent care center, or emergency room.  If you have a medical emergency, please immediately call 911 or go to the emergency department.  Pager Numbers  - Dr. Kowalski: [phone]  - Dr. Moye: [phone]  - Dr. Stewart: [phone]  In the event of inclement weather, please call our main line at [phone] for an update on the status of any delays or closures.  Dermatology Medication Tips: Please keep the boxes that topical medications come in in order to help keep track of the instructions about where and how to use these. Pharmacies typically print the medication instructions only on the boxes and not directly on the medication tubes.   If your medication is too expensive, please contact our office at [phone] option 4 or send us a message through MyChart.   We are unable to tell what your co-pay for medications will be in advance as this is different depending on your insurance coverage. However, we may be able to find a substitute medication at lower cost or fill out paperwork to get insurance to cover a needed medication.   If a prior authorization is required to get your medication covered by your insurance company, please allow us 1-2 business days to complete this process.  Drug prices often vary depending on where the prescription is filled and some pharmacies may offer cheaper prices.  The website www.goodrx.com contains coupons for medications through different pharmacies. The prices here do not  account for what the cost may be with help from insurance (it may be cheaper with your insurance), but the website can give you the price if you did not use any insurance.  - You can print the associated coupon and take it with your prescription to the pharmacy.  - You may also stop by our office during regular business hours and pick up a GoodRx coupon card.  - If you need your prescription sent electronically to a different pharmacy, notify our office through [facility] MyChart or by phone at [phone] option 4.     Si Usted Necesita Algo Despus de Su Visita  Tambin puede enviarnos un mensaje a travs de MyChart. Por lo general respondemos a los mensajes de MyChart en el transcurso de 1 a 2 das hbiles.  Para renovar recetas, por favor pida a su farmacia que se ponga en   contacto con nuestra oficina. Nuestro nmero de fax es el [phone].  Si tiene un asunto urgente cuando la clnica est cerrada y que no puede esperar hasta el siguiente da hbil, puede llamar/localizar a su doctor(a) al nmero que aparece a continuacin.   Por favor, tenga en cuenta que aunque hacemos todo lo posible para estar disponibles para asuntos urgentes fuera del horario de oficina, no estamos disponibles las 24 horas del da, los 7 das de la semana.   Si tiene un problema urgente y no puede comunicarse con nosotros, puede optar por buscar atencin mdica  en el consultorio de su doctor(a), en una clnica privada, en un centro de atencin urgente o en una sala de emergencias.  Si tiene una emergencia mdica, por favor llame inmediatamente al 911 o vaya a la sala de emergencias.  Nmeros de bper  - Dr. Kowalski: [phone]  - Dra. Moye: [phone]  - Dra. Stewart: [phone]  En caso de inclemencias del tiempo, por favor llame a nuestra lnea principal al [phone] para una actualizacin sobre el estado de cualquier retraso o cierre.  Consejos para la medicacin en dermatologa: Por  favor, guarde las cajas en las que vienen los medicamentos de uso tpico para ayudarle a seguir las instrucciones sobre dnde y cmo usarlos. Las farmacias generalmente imprimen las instrucciones del medicamento slo en las cajas y no directamente en los tubos del medicamento.   Si su medicamento es muy caro, por favor, pngase en contacto con nuestra oficina llamando al [phone] y presione la opcin 4 o envenos un mensaje a travs de MyChart.   No podemos decirle cul ser su copago por los medicamentos por adelantado ya que esto es diferente dependiendo de la cobertura de su seguro. Sin embargo, es posible que podamos encontrar un medicamento sustituto a menor costo o llenar un formulario para que el seguro cubra el medicamento que se considera necesario.   Si se requiere una autorizacin previa para que su compaa de seguros cubra su medicamento, por favor permtanos de 1 a 2 das hbiles para completar este proceso.  Los precios de los medicamentos varan con frecuencia dependiendo del lugar de dnde se surte la receta y alguna farmacias pueden ofrecer precios ms baratos.  El sitio web www.goodrx.com tiene cupones para medicamentos de diferentes farmacias. Los precios aqu no tienen en cuenta lo que podra costar con la ayuda del seguro (puede ser ms barato con su seguro), pero el sitio web puede darle el precio si no utiliz ningn seguro.  - Puede imprimir el cupn correspondiente y llevarlo con su receta a la farmacia.  - Tambin puede pasar por nuestra oficina durante el horario de atencin regular y recoger una tarjeta de cupones de GoodRx.  - Si necesita que su receta se enve electrnicamente a una farmacia diferente, informe a nuestra oficina a travs de MyChart de [place] o por telfono llamando al [phone] y presione la opcin 4.  

## 2023-02-02 NOTE — Progress Notes (Signed)
Follow-Up Visit   Subjective  Joyce Evans is a 82 y.o. 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 The patient has spots, moles and lesions to be evaluated, some may be new or changing and the patient may have concern these could be cancer.  The following portions of the chart were reviewed this encounter and updated as appropriate: medications, allergies, medical history  Review of Systems:  No other skin or systemic complaints except as noted in HPI or Assessment and Plan.  Objective  Well appearing patient in no apparent distress; mood and affect are within normal limits. A focused examination was performed of the following areas: the face Relevant exam findings are noted in the Assessment and Plan.  R med cheek Pink biopsy site.  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  Destruction of lesion Complexity: extensive   Destruction method: electrodesiccation and curettage   Informed consent: discussed and consent obtained   Timeout:  patient name, date of birth, surgical site, and procedure verified Procedure prep:  Patient was prepped and draped in usual sterile fashion Prep type:  Isopropyl alcohol Anesthesia: the lesion was anesthetized in a standard fashion   Anesthetic:  1% lidocaine w/ epinephrine 1-100,000 buffered w/ 8.4% NaHCO3 Curettage performed in three different directions: Yes   Electrodesiccation performed over the curetted area: Yes   Final wound size (cm):  1.6 Hemostasis achieved with:  pressure, aluminum chloride and electrodesiccation Outcome: patient tolerated procedure well with no complications   Post-procedure details:  sterile dressing applied and wound care instructions given   Dressing type: bandage and petrolatum    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  Will refer for radiation at [facility]  [place] (actinic keratosis) (1) L hand dorsum x 1 Destruction of lesion - L hand dorsum x 1 (2) Complexity: simple   Destruction method: cryotherapy   Informed consent: discussed and consent obtained   Timeout:  patient name, date of birth, surgical site, and procedure verified Lesion destroyed using liquid nitrogen: Yes   Region frozen until ice ball extended beyond lesion: Yes   Outcome: patient tolerated procedure well with no complications   Post-procedure details: wound care instructions given    Inflamed seborrheic keratosis (3) R lat neck x 3 Symptomatic, irritating, patient would like treated. Destruction of lesion - R lat neck x 3 (3) Complexity: simple   Destruction method: cryotherapy   Informed consent: discussed and consent obtained   Timeout:  patient name, date of birth, surgical site, and procedure verified Lesion destroyed using liquid nitrogen: Yes   Region frozen until ice ball extended beyond lesion: Yes   Outcome: patient tolerated procedure well with no complications   Post-procedure details: wound care instructions given    HISTORY OF BASAL CELL CARCINOMA OF THE SKIN - R preauricular  - No evidence of recurrence today - Recommend regular full body skin exams - Recommend daily broad spectrum sunscreen SPF 30+ to sun-exposed areas, reapply every 2 hours as needed.  -  Call if any new or changing lesions are noted between office visits  ACTINIC DAMAGE - chronic, secondary to cumulative UV radiation exposure/sun exposure over time - diffuse scaly erythematous macules with underlying dyspigmentation - Recommend daily broad spectrum sunscreen SPF 30+ to sun-exposed areas, reapply every 2 hours  as needed.  - Recommend staying in the shade or wearing long sleeves, sun glasses (UVA+UVB protection) and wide brim hats (4-inch brim around the entire circumference of the hat). - Call for new or changing lesions.  SEBORRHEIC KERATOSIS - Stuck-on, waxy, tan-brown papules and/or plaques  - Benign-appearing - Discussed benign etiology and prognosis. - Observe - Call for any changes  LENTIGINES Exam: scattered tan macules Due to sun exposure Treatment Plan: Benign-appearing, observe. Recommend daily broad spectrum sunscreen SPF 30+ to sun-exposed areas, reapply every 2 hours as needed.  Call for any changes  Return for appointment as scheduled.  Maylene Roes, CMA, am acting as scribe for Armida Sans, MD .  Documentation: I have reviewed the above documentation for accuracy and completeness, and I agree with the above.  Armida Sans, MD

## 2023-02-02 NOTE — Progress Notes (Signed)
Referral sent to radiation oncology at [facility] for bx proven infiltrative BCC of the dorsum nose supra tip.

## 2023-02-03 ENCOUNTER — Encounter: Payer: Medicare PPO | Admitting: *Deleted

## 2023-02-03 DIAGNOSIS — J849 Interstitial pulmonary disease, unspecified: Secondary | ICD-10-CM

## 2023-02-03 NOTE — Progress Notes (Signed)
Daily Session Note  Patient Details  Name: Joyce Evans MRN: [id number] Date of Birth: Oct 28, 1940 Referring Provider:   Doristine Devoid Pulmonary Rehab from 11/02/2022 in [facility] Cardiac and Pulmonary Rehab  Referring Provider Kloefkorn       Encounter Date: 02/03/2023  Check In:  Session Check In - 02/03/23 1529       Check-In   Supervising physician immediately available to respond to emergencies See telemetry face sheet for immediately available ER MD    Location [facility]-Cardiac & Pulmonary Rehab    Staff Present Susann Givens, RN Atilano Median, RN, ADN;Other   Maggie Best and Max Conetta   Virtual Visit No    Medication changes reported     No    Fall or balance concerns reported    No    Warm-up and Cool-down Performed on first and last piece of equipment    Resistance Training Performed Yes    VAD Patient? No    PAD/SET Patient? No      Pain Assessment   Currently in Pain? No/denies                Social History   Tobacco Use  Smoking Status Never  Smokeless Tobacco Never    Goals Met:  Independence with exercise equipment Exercise tolerated well No report of concerns or symptoms today Strength training completed today  Goals Unmet:  Not Applicable  Comments: Pt able to follow exercise prescription today without complaint.  Will continue to monitor for progression.    Dr. Bethann Punches is Medical Director for [facility] Card[facility] Rehabilitation.  Dr. Vida Rigger is Medical Director for [facility] Pulmonary Rehabilitation.

## 2023-02-04 ENCOUNTER — Encounter: Payer: Medicare PPO | Admitting: *Deleted

## 2023-02-04 DIAGNOSIS — J849 Interstitial pulmonary disease, unspecified: Secondary | ICD-10-CM

## 2023-02-04 NOTE — Progress Notes (Signed)
Daily Session Note  Patient Details  Name: Joyce Evans MRN: [id number] Date of Birth: 10-08-1940 Referring Provider:   Flowsheet Row Pulmonary Rehab from 11/02/2022 in [facility] Cardiac and Pulmonary Rehab  Referring Provider Kloefkorn       Encounter Date: 02/04/2023  Check In:  Session Check In - 02/04/23 1537       Check-In   Supervising physician immediately available to respond to emergencies See telemetry face sheet for immediately available ER MD    Location [facility]-Cardiac & Pulmonary Rehab    Staff Present Susann Givens, RN BSN;Joseph Gallant, RCP,RRT,BSRT;Other   Max [place] and [place] Grater Best   Virtual Visit No    Medication changes reported     No    Fall or balance concerns reported    No    Warm-up and Cool-down Performed on first and last piece of equipment    Resistance Training Performed Yes    VAD Patient? No    PAD/SET Patient? No      Pain Assessment   Currently in Pain? No/denies                Social History   Tobacco Use  Smoking Status Never  Smokeless Tobacco Never    Goals Met:  Independence with exercise equipment Exercise tolerated well No report of concerns or symptoms today Strength training completed today  Goals Unmet:  Not Applicable  Comments: Pt able to follow exercise prescription today without complaint.  Will continue to monitor for progression.    Dr. Bethann Punches is Medical Director for [facility] Cardiac Rehabilitation.  Dr. Vida Rigger is Medical Director for [facility] Pulmonary Rehabilitation.

## 2023-02-08 ENCOUNTER — Encounter: Payer: Medicare PPO | Admitting: *Deleted

## 2023-02-08 DIAGNOSIS — J849 Interstitial pulmonary disease, unspecified: Secondary | ICD-10-CM

## 2023-02-08 NOTE — Progress Notes (Signed)
Daily Session Note  Patient Details  Name: Joyce Evans MRN: [id number] Date of Birth: [birth year] Referring Provider:   Flowsheet Row Pulmonary Rehab from 11/02/2022 in [facility] Cardiac and Pulmonary Rehab  Referring Provider Kloefkorn       Encounter Date: 02/08/2023  Check In:  Session Check In - 02/08/23 1532       Check-In   Supervising physician immediately available to respond to emergencies See telemetry face sheet for immediately available ER MD    Location [facility]-Cardiac & Pulmonary Rehab    Staff Present Susann Givens, RN BSN;Joseph [place], RCP,RRT,BSRT;Other   Max Conetta   Virtual Visit No    Medication changes reported     No    Fall or balance concerns reported    No    Warm-up and Cool-down Performed on first and last piece of equipment    Resistance Training Performed Yes    VAD Patient? No    PAD/SET Patient? No      Pain Assessment   Currently in Pain? No/denies                Social History   Tobacco Use  Smoking Status Never  Smokeless Tobacco Never    Goals Met:  Independence with exercise equipment Exercise tolerated well No report of concerns or symptoms today Strength training completed today  Goals Unmet:  Not Applicable  Comments: Pt able to follow exercise prescription today without complaint.  Will continue to monitor for progression.    Dr. Bethann Punches is Medical Director for [facility] Cardiac Rehabilitation.  Dr. Vida Rigger is Medical Director for [facility] Pulmonary Rehabilitation.

## 2023-02-10 ENCOUNTER — Encounter: Payer: Self-pay | Admitting: Radiation Oncology

## 2023-02-10 ENCOUNTER — Ambulatory Visit
Admission: RE | Admit: 2023-02-10 | Discharge: 2023-02-10 | Disposition: A | Discharge status: Home / Self Care | Payer: Medicare PPO | Source: Ambulatory Visit | Attending: Radiation Oncology | Admitting: Radiation Oncology

## 2023-02-10 VITALS — BP 129/73 | HR 67 | Temp 98.0°F | Resp 16 | Wt 133.1 lb

## 2023-02-10 DIAGNOSIS — K219 Gastro-esophageal reflux disease without esophagitis: Secondary | ICD-10-CM | POA: Insufficient documentation

## 2023-02-10 DIAGNOSIS — Z7901 Long term (current) use of anticoagulants: Secondary | ICD-10-CM | POA: Insufficient documentation

## 2023-02-10 DIAGNOSIS — Z952 Presence of prosthetic heart valve: Secondary | ICD-10-CM | POA: Diagnosis not present

## 2023-02-10 DIAGNOSIS — R011 Cardiac murmur, unspecified: Secondary | ICD-10-CM | POA: Insufficient documentation

## 2023-02-10 DIAGNOSIS — J8410 Pulmonary fibrosis, unspecified: Secondary | ICD-10-CM | POA: Diagnosis not present

## 2023-02-10 DIAGNOSIS — I4891 Unspecified atrial fibrillation: Secondary | ICD-10-CM | POA: Insufficient documentation

## 2023-02-10 DIAGNOSIS — Z803 Family history of malignant neoplasm of breast: Secondary | ICD-10-CM | POA: Diagnosis not present

## 2023-02-10 DIAGNOSIS — Z85820 Personal history of malignant melanoma of skin: Secondary | ICD-10-CM | POA: Diagnosis not present

## 2023-02-10 DIAGNOSIS — I10 Essential (primary) hypertension: Secondary | ICD-10-CM | POA: Insufficient documentation

## 2023-02-10 DIAGNOSIS — M129 Arthropathy, unspecified: Secondary | ICD-10-CM | POA: Diagnosis not present

## 2023-02-10 DIAGNOSIS — C44310 Basal cell carcinoma of skin of unspecified parts of face: Secondary | ICD-10-CM | POA: Diagnosis present

## 2023-02-10 DIAGNOSIS — Z79899 Other long term (current) drug therapy: Secondary | ICD-10-CM | POA: Diagnosis not present

## 2023-02-10 NOTE — Consult Note (Signed)
NEW PATIENT EVALUATION  Name: Joyce Evans  MRN: [id number]  Date:   02/10/2023     DOB: 1941-06-10   This 82 y.o. female patient presents to the clinic for initial evaluation of basal cell carcinoma the nose tip.  REFERRING PHYSICIAN: Kandyce Rud, MD  CHIEF COMPLAINT: No chief complaint on file.   DIAGNOSIS: The encounter diagnosis was Basal cell carcinoma (BCC) of skin of face, unspecified part of face.   PREVIOUS INVESTIGATIONS:  Pathology report reviewed Clinical notes reviewed  HPI: Patient is a 82 year old female with recent biopsy of a lesion on her nose tip by dermatology showing basal cell carcinoma infiltrative pattern with base involved.  She is asymptomatic from this lesion.  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  She is otherwise doing well.  PLANNED TREATMENT REGIMEN: Electron-beam therapy  PAST MEDICAL HISTORY:  has a past medical history of Actinic keratosis, Arthritis, Atrial fibrillation (HCC), Basal cell carcinoma (01/05/2023), Basal cell carcinoma (BCC) (01/05/2023), Dizziness, Family history of adverse reaction to anesthesia, Gallstones (2016, 2017), GERD (gastroesophageal reflux disease), Glaucoma, Heart murmur, Hypertension, Melanoma (HCC) (04/20/2016), Numbness in left leg, Sciatica, Seasonal allergies, Skin cancer, Squamous cell carcinoma in situ (01/05/2023), Squamous cell carcinoma of skin, and Tricuspid valve disorder.    PAST SURGICAL HISTORY:  Past Surgical History:  Procedure Laterality Date   ABDOMINAL HYSTERECTOMY  1987   partial   CARDIAC CATHETERIZATION  2010   [facility]   CATARACT EXTRACTION W/PHACO Right 08/28/2015   Procedure: CATARACT EXTRACTION PHACO AND INTRAOCULAR LENS PLACEMENT (IOC);  Surgeon: Lockie Mola, MD;  Location: [facility];  Service: Ophthalmology;  Laterality: Right;  TORIC   INTRAMEDULLARY (IM) NAIL INTERTROCHANTERIC Right 10/16/2021   Procedure:  INTRAMEDULLARY (IM) NAIL INTERTROCHANTRIC;  Surgeon: Ross Marcus, MD;  Location: [facility];  Service: Orthopedics;  Laterality: Right;   MELANOMA EXCISION Left 05/19/2016   MIS. Left mid. lat. tricep. Margins free   MOHS SURGERY  2013   TONSILLECTOMY  1971    FAMILY HISTORY: family history includes Breast cancer in her paternal aunt; Breast cancer (age of onset: 64) in her daughter.  SOCIAL HISTORY:  reports that she has never smoked. She has never used smokeless tobacco. She reports current alcohol use. She reports that she does not use drugs.  ALLERGIES: Magnesium sulfate, Omeprazole, Other, Epinephrine, and Prevacid [lansoprazole]  MEDICATIONS:  Current Outpatient Medications  Medication Sig Dispense Refill   acetaminophen (TYLENOL) 500 MG tablet Take 500 mg by mouth every 6 (six) hours as needed.     apixaban (ELIQUIS) 5 MG TABS tablet Take 5 mg by mouth 2 (two) times daily.     apixaban (ELIQUIS) 5 MG TABS tablet Take 1 tablet by mouth 2 (two) times daily.     Apple Cider Vinegar 300 MG TABS Take 1 tablet by mouth daily.     atorvastatin (LIPITOR) 40 MG tablet Take by mouth.     atorvastatin (LIPITOR) 40 MG tablet Take 1 tablet by mouth daily.     brimonidine (ALPHAGAN P) 0.1 % SOLN Place 1 drop into the right eye 2 (two) times daily.     brimonidine (ALPHAGAN) 0.2 % ophthalmic solution      Brinzolamide-Brimonidine 1-0.2 % SUSP Place 1 drop into the left eye 2 (two) times daily.     Cholecalciferol (VITAMIN D3) 50 MCG (2000 UT) capsule Take by mouth.     Cholecalciferol 50 MCG (2000 UT) CAPS Take 3 capsules by mouth in the  morning.     citalopram (CELEXA) 20 MG tablet Take 10 mg by mouth daily.     citalopram (CELEXA) 20 MG tablet Take by mouth.     fluticasone (FLONASE) 50 MCG/ACT nasal spray Place 2 sprays into the nose daily.     furosemide (LASIX) 20 MG tablet TAKE 1 TABLET ONE TIME DAILY     losartan (COZAAR) 50 MG tablet Take 1 tablet by mouth daily.     magnesium  gluconate 54mg /27ml syringe Take by mouth.     multivitamin-iron-minerals-folic acid (CENTRUM) chewable tablet Chew 1 tablet by mouth daily.     omeprazole (PRILOSEC) 40 MG capsule Take by mouth.     Probiotic Product (PROBIOTIC DAILY PO) Take by mouth daily.     senna-docusate (SENOKOT-S) 8.6-50 MG tablet Take 1 tablet by mouth 2 (two) times daily.     timolol (BETIMOL) 0.5 % ophthalmic solution Place 1 drop into both eyes daily.     White Petrolatum-Mineral Oil (SYSTANE NIGHTTIME) OINT Apply 1 application. to eye at bedtime.     ZINC CITRATE PO Take 1 tablet by mouth 2 (two) times daily.     zinc gluconate 3.75 mg/mL SOLN Take 1 tablet by mouth 2 (two) times daily.     pantoprazole (PROTONIX) 40 MG tablet Take 40 mg by mouth daily.     No current facility-administered medications for this encounter.    ECOG PERFORMANCE STATUS:  0 - Asymptomatic  REVIEW OF SYSTEMS: Patient denies any weight loss, fatigue, weakness, fever, chills or night sweats. Patient denies any loss of vision, blurred vision. Patient denies any ringing  of the ears or hearing loss. No irregular heartbeat. Patient denies heart murmur or history of fainting. Patient denies any chest pain or pain radiating to her upper extremities. Patient denies any shortness of breath, difficulty breathing at night, cough or hemoptysis. Patient denies any swelling in the lower legs. Patient denies any nausea vomiting, vomiting of blood, or coffee ground material in the vomitus. Patient denies any stomach pain. Patient states has had normal bowel movements no significant constipation or diarrhea. Patient denies any dysuria, hematuria or significant nocturia. Patient denies any problems walking, swelling in the joints or loss of balance. Patient denies any skin changes, loss of hair or loss of weight. Patient denies any excessive worrying or anxiety or significant depression. Patient denies any problems with insomnia. Patient denies excessive  thirst, polyuria, polydipsia. Patient denies any swollen glands, patient denies easy bruising or easy bleeding. Patient denies any recent infections, allergies or URI. Patient "s visual fields have not changed significantly in recent time.   PHYSICAL EXAM: BP 129/73   Pulse 67   Temp 98 F (36.7 C) (Tympanic)   Resp 16   Wt 133 lb 1.6 oz (60.4 kg)   BMI 24.34 kg/m  Wheelchair-bound female on nasal oxygen in NAD.  She has a small raised lesion on the nose tip compatible with known basal cell carcinoma.  No evidence of submental or cervical adenopathy is noted.  Well-developed well-nourished patient in NAD. HEENT reveals PERLA, EOMI, discs not visualized.  Oral cavity is clear. No oral mucosal lesions are identified. Neck is clear without evidence of cervical or supraclavicular adenopathy. Lungs are clear to A&P. Cardiac examination is essentially unremarkable with regular rate and rhythm without murmur rub or thrill. Abdomen is benign with no organomegaly or masses noted. Motor sensory and DTR levels are equal and symmetric in the upper and lower extremities. Cranial nerves II through  XII are grossly intact. Proprioception is intact. No peripheral adenopathy or edema is identified. No motor or sensory levels are noted. Crude visual fields are within normal range.  LABORATORY DATA: Pathology report reviewed    RADIOLOGY RESULTS: No current films for review   IMPRESSION: Basal cell carcinoma of the nose tip in 82 year old female  PLAN: This time elect to go ahead with electron-beam therapy to her nose would plan on delivering 50 Gray over 25 fractions.  Risks and benefits of treatment including skin reaction to her nose slight fatigue all were explained in detail to the patient.  I assured her no other organs including eyes ears or her pulmonary system would be affected by the radiation.  I have personally set up and ordered CT simulation.  Patient and husband both comprehend the recommendations  well.  I would like to take this opportunity to thank you for allowing me to participate in the care of your patient.Carmina Miller, MD

## 2023-02-15 ENCOUNTER — Encounter: Payer: Medicare PPO | Attending: Internal Medicine | Admitting: *Deleted

## 2023-02-15 DIAGNOSIS — J849 Interstitial pulmonary disease, unspecified: Secondary | ICD-10-CM | POA: Insufficient documentation

## 2023-02-15 NOTE — Progress Notes (Signed)
Daily Session Note  Patient Details  Name: Joyce Evans MRN: [id number] Date of Birth: Jul 20, 1940 Referring Provider:   Flowsheet Row Pulmonary Rehab from 11/02/2022 in [facility]  Referring Provider Kloefkorn       Encounter Date: 02/15/2023  Check In:  Session Check In - 02/15/23 1541       Check-In   Supervising physician immediately available to respond to emergencies See telemetry face sheet for immediately available ER MD    Location [facility]    Staff Present Susann Givens, RN BSN;Other;Joseph Reino Kent, RCP,RRT,BSRT   Max Conetta BS   Virtual Visit No    Medication changes reported     No    Fall or balance concerns reported    No    Tobacco Cessation No Change    Warm-up and Cool-down Performed on first and last piece of equipment    Resistance Training Performed Yes    VAD Patient? No    PAD/SET Patient? No      Pain Assessment   Currently in Pain? No/denies                Social History   Tobacco Use  Smoking Status Never  Smokeless Tobacco Never    Goals Met:  Independence with exercise equipment Exercise tolerated well No report of concerns or symptoms today Strength training completed today  Goals Unmet:  Not Applicable  Comments: Pt able to follow exercise prescription today without complaint.  Will continue to monitor for progression.    Dr. Bethann Punches is Medical Director for [facility].  Dr. Vida Rigger is Medical Director for [facility].

## 2023-02-17 ENCOUNTER — Encounter: Payer: Self-pay | Admitting: *Deleted

## 2023-02-17 DIAGNOSIS — J849 Interstitial pulmonary disease, unspecified: Secondary | ICD-10-CM

## 2023-02-17 NOTE — Progress Notes (Signed)
Pulmonary Individual Treatment Plan  Patient Details  Name: AKISHA SCHMIDTKE MRN: [id number] Date of Birth: Jul 18, 1940 Referring Provider:   Flowsheet Row Pulmonary Rehab from 11/02/2022 in [facility] Cardiac and Pulmonary Rehab  Referring Provider Kloefkorn       Initial Encounter Date:  Flowsheet Row Pulmonary Rehab from 11/02/2022 in [facility] Card[facility] and Pulmonary Rehab  Date 11/02/22       Visit Diagnosis: ILD (interstitial lung disease) (HCC)  Patient's Home Medications on Admission:  Current Outpatient Medications:    acetaminophen (TYLENOL) 500 MG tablet, Take 500 mg by mouth every 6 (six) hours as needed., Disp: , Rfl:    apixaban (ELIQUIS) 5 MG TABS tablet, Take 5 mg by mouth 2 (two) times daily., Disp: , Rfl:    apixaban (ELIQUIS) 5 MG TABS tablet, Take 1 tablet by mouth 2 (two) times daily., Disp: , Rfl:    Apple Cider Vinegar 300 MG TABS, Take 1 tablet by mouth daily., Disp: , Rfl:    atorvastatin (LIPITOR) 40 MG tablet, Take by mouth., Disp: , Rfl:    atorvastatin (LIPITOR) 40 MG tablet, Take 1 tablet by mouth daily., Disp: , Rfl:    brimonidine (ALPHAGAN P) 0.1 % SOLN, Place 1 drop into the right eye 2 (two) times daily., Disp: , Rfl:    brimonidine (ALPHAGAN) 0.2 % ophthalmic solution, , Disp: , Rfl:    Brinzolamide-Brimonidine 1-0.2 % SUSP, Place 1 drop into the left eye 2 (two) times daily., Disp: , Rfl:    Cholecalciferol (VITAMIN D3) 50 MCG (2000 UT) capsule, Take by mouth., Disp: , Rfl:    Cholecalciferol 50 MCG (2000 UT) CAPS, Take 3 capsules by mouth in the morning., Disp: , Rfl:    citalopram (CELEXA) 20 MG tablet, Take 10 mg by mouth daily., Disp: , Rfl:    citalopram (CELEXA) 20 MG tablet, Take by mouth., Disp: , Rfl:    fluticasone (FLONASE) 50 MCG/ACT nasal spray, Place 2 sprays into the nose daily., Disp: , Rfl:    furosemide (LASIX) 20 MG tablet, TAKE 1 TABLET ONE TIME DAILY, Disp: , Rfl:    losartan (COZAAR) 50 MG tablet, Take 1 tablet by mouth daily., Disp: ,  Rfl:    magnesium gluconate 54mg /5ml syringe, Take by mouth., Disp: , Rfl:    multivitamin-iron-minerals-folic acid (CENTRUM) chewable tablet, Chew 1 tablet by mouth daily., Disp: , Rfl:    omeprazole (PRILOSEC) 40 MG capsule, Take by mouth., Disp: , Rfl:    pantoprazole (PROTONIX) 40 MG tablet, Take 40 mg by mouth daily., Disp: , Rfl:    Probiotic Product (PROBIOTIC DAILY PO), Take by mouth daily., Disp: , Rfl:    senna-docusate (SENOKOT-S) 8.6-50 MG tablet, Take 1 tablet by mouth 2 (two) times daily., Disp: , Rfl:    timolol (BETIMOL) 0.5 % ophthalmic solution, Place 1 drop into both eyes daily., Disp: , Rfl:    White Petrolatum-Mineral Oil (SYSTANE NIGHTTIME) OINT, Apply 1 application. to eye at bedtime., Disp: , Rfl:    ZINC CITRATE PO, Take 1 tablet by mouth 2 (two) times daily., Disp: , Rfl:    zinc gluconate 3.75 mg/mL SOLN, Take 1 tablet by mouth 2 (two) times daily., Disp: , Rfl:   Past Medical History: Past Medical History:  Diagnosis Date   Actinic keratosis    Arthritis    osteo - shoulders, fingers   Atrial fibrillation (HCC)    Basal cell carcinoma 01/05/2023   dorsum nose supratip, need treatment mohs or radiation  Basal cell carcinoma (BCC) 01/05/2023   right preauricular, EDC   Dizziness    thinks because of diuretic   Family history of adverse reaction to anesthesia    daughter -PONV   Gallstones 2016, 2017   GERD (gastroesophageal reflux disease)    Glaucoma    Heart murmur    history of   Hypertension    Melanoma (HCC) 04/20/2016   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 Excised: 05/19/2016, margins free.   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   Squamous cell carcinoma of skin    R nasal labial   Tricuspid valve disorder    leak    Tobacco Use: Social History   Tobacco Use  Smoking  Status Never  Smokeless Tobacco Never    Labs: Review Flowsheet       Latest Ref Rng & Units 06/17/2020 06/18/2020  Labs for ITP Cardiac and Pulmonary Rehab  Cholestrol 0 - 200 mg/dL - 644   LDL (calc) 0 - 99 mg/dL - 034   Direct LDL 0 - 99 mg/dL 742.5  -  HDL-C >95 mg/dL - 51   Trlycerides <638 mg/dL - 57   Hemoglobin V5I 4.8 - 5.6 % 5.4  -    Details             Pulmonary Assessment Scores:  Pulmonary Assessment Scores     Row Name 11/02/22 1737         ADL UCSD   ADL Phase Entry     SOB Score total 67     Rest 2     Walk 3     Stairs 4     Bath 2     Dress 2     Shop 3       CAT Score   CAT Score 23       mMRC Score   mMRC Score 2              UCSD: Self-administered rating of dyspnea associated with activities of daily living (ADLs) 6-point scale (0 = "not at all" to 5 = "maximal or unable to do because of breathlessness")  Scoring Scores range from 0 to 120.  Minimally important difference is 5 units  CAT: CAT can identify the health impairment of COPD patients and is better correlated with disease progression.  CAT has a scoring range of zero to 40. The CAT score is classified into four groups of low (less than 10), medium (10 - 20), high (21-30) and very high (31-40) based on the impact level of disease on health status. A CAT score over 10 suggests significant symptoms.  A worsening CAT score could be explained by an exacerbation, poor medication adherence, poor inhaler technique, or progression of COPD or comorbid conditions.  CAT MCID is 2 points  mMRC: mMRC (Modified Medical Research Council) Dyspnea Scale is used to assess the degree of baseline functional disability in patients of respiratory disease due to dyspnea. No minimal important difference is established. A decrease in score of 1 point or greater is considered a positive change.   Pulmonary Function Assessment:  Pulmonary Function Assessment - 10/28/22 1350       Breath    Shortness of Breath Yes;Limiting activity  Exercise Target Goals: Exercise Program Goal: Individual exercise prescription set using results from initial 6 min walk test and THRR while considering  patient's activity barriers and safety.   Exercise Prescription Goal: Initial exercise prescription builds to 30-45 minutes a day of aerobic activity, 2-3 days per week.  Home exercise guidelines will be given to patient during program as part of exercise prescription that the participant will acknowledge.  Education: Aerobic Exercise: - Group verbal and visual presentation on the components of exercise prescription. Introduces F.I.T.T principle from ACSM for exercise prescriptions.  Reviews F.I.T.T. principles of aerobic exercise including progression. Written material given at graduation.   Education: Resistance Exercise: - Group verbal and visual presentation on the components of exercise prescription. Introduces F.I.T.T principle from ACSM for exercise prescriptions  Reviews F.I.T.T. principles of resistance exercise including progression. Written material given at graduation. Flowsheet Row Pulmonary Rehab from 12/25/2020 in [facility] Cardiac and Pulmonary Rehab  Date 11/06/20  Educator AS  Instruction Review Code 1- Verbalizes Understanding        Education: Exercise & Equipment Safety: - Individual verbal instruction and demonstration of equipment use and safety with use of the equipment. Flowsheet Row Pulmonary Rehab from 01/27/2023 in [facility] Cardiac and Pulmonary Rehab  Date 11/02/22  Educator [facility]  Instruction Review Code 1- Verbalizes Understanding       Education: Exercise Physiology & General Exercise Guidelines: - Group verbal and written instruction with models to review the exercise physiology of the cardiovascular system and associated critical values. Provides general exercise guidelines with specific guidelines to those with heart or lung disease.  Flowsheet Row  Pulmonary Rehab from 12/25/2020 in [facility] Cardiac and Pulmonary Rehab  Date 10/23/20  Educator AS  Instruction Review Code 1- Verbalizes Understanding       Education: Flexibility, Balance, Mind/Body Relaxation: - Group verbal and visual presentation with interactive activity on the components of exercise prescription. Introduces F.I.T.T principle from ACSM for exercise prescriptions. Reviews F.I.T.T. principles of flexibility and balance exercise training including progression. Also discusses the mind body connection.  Reviews various relaxation techniques to help reduce and manage stress (i.e. Deep breathing, progressive muscle relaxation, and visualization). Balance handout provided to take home. Written material given at graduation. Flowsheet Row Pulmonary Rehab from 01/27/2023 in [facility] Cardiac and Pulmonary Rehab  Date 12/30/22  Educator NT  Instruction Review Code 1- Verbalizes Understanding       Activity Barriers & Risk Stratification:  Activity Barriers & Cardiac Risk Stratification - 11/02/22 1728       Activity Barriers & Cardiac Risk Stratification   Activity Barriers Deconditioning;Muscular Weakness;Shortness of Breath;History of Falls;Balance Concerns;Joint Problems             6 Minute Walk:  6 Minute Walk     Row Name 11/02/22 1726         6 Minute Walk   Phase Initial     Distance 750 feet     Walk Time 6 minutes     # of Rest Breaks 0     MPH 1.42     METS 1.48     RPE 13     Perceived Dyspnea  2     VO2 Peak 5.16     Symptoms No     Resting HR 69 bpm     Resting BP 122/72     Resting Oxygen Saturation  96 %     Exercise Oxygen Saturation  during 6 min walk 85 %     Max  Ex. HR 104 bpm     Max Ex. BP 124/72     2 Minute Post BP 114/62       Interval HR   1 Minute HR 89     2 Minute HR 88     3 Minute HR 97     4 Minute HR 99     5 Minute HR 102     6 Minute HR 104     2 Minute Post HR 60     Interval Heart Rate? Yes       Interval Oxygen    Interval Oxygen? Yes     Baseline Oxygen Saturation % 96 %     1 Minute Oxygen Saturation % 92 %     1 Minute Liters of Oxygen 4 L     2 Minute Oxygen Saturation % 87 %     2 Minute Liters of Oxygen 4 L     3 Minute Oxygen Saturation % 87 %     3 Minute Liters of Oxygen 4 L     4 Minute Oxygen Saturation % 85 %     4 Minute Liters of Oxygen 4 L     5 Minute Oxygen Saturation % 85 %     5 Minute Liters of Oxygen 4 L     6 Minute Oxygen Saturation % 86 %     6 Minute Liters of Oxygen 4 L     2 Minute Post Oxygen Saturation % 91 %     2 Minute Post Liters of Oxygen 4 L             Oxygen Initial Assessment:  Oxygen Initial Assessment - 11/02/22 1736       Home Oxygen   Home Oxygen Device Home Concentrator;Portable Concentrator;E-Tanks    Sleep Oxygen Prescription Continuous    Liters per minute 3    Liters per minute 3    Home Resting Oxygen Prescription Continuous    Liters per minute 2    Compliance with Home Oxygen Use Yes      Initial 6 min Walk   Oxygen Used Continuous;Portable Concentrator    Liters per minute 4      Program Oxygen Prescription   Program Oxygen Prescription Continuous    Liters per minute 3      Intervention   Short Term Goals To learn and exhibit compliance with exercise, home and travel O2 prescription;To learn and understand importance of maintaining oxygen saturations>88%;To learn and demonstrate proper use of respiratory medications;To learn and understand importance of monitoring SPO2 with pulse oximeter and demonstrate accurate use of the pulse oximeter.;To learn and demonstrate proper pursed lip breathing techniques or other breathing techniques.     Long  Term Goals Verbalizes importance of monitoring SPO2 with pulse oximeter and return demonstration;Exhibits proper breathing techniques, such as pursed lip breathing or other method taught during program session;Demonstrates proper use of MDI's;Compliance with respiratory  medication;Maintenance of O2 saturations>88%;Exhibits compliance with exercise, home  and travel O2 prescription             Oxygen Re-Evaluation:  Oxygen Re-Evaluation     Row Name 11/04/22 1413 11/23/22 1558 12/10/22 1558 01/25/23 1602       Program Oxygen Prescription   Program Oxygen Prescription -- Continuous Continuous Continuous    Liters per minute -- 4 4 4       Home Oxygen   Home Oxygen Device -- Home Concentrator;Portable Concentrator;E-Tanks Home Concentrator;Portable Concentrator;E-Tanks Home  Concentrator;Portable Concentrator;E-Tanks    Sleep Oxygen Prescription -- Continuous Continuous Continuous    Liters per minute -- 4 4 4     Home Exercise Oxygen Prescription -- Continuous -- --    Liters per minute -- 4 4 4     Home Resting Oxygen Prescription -- Continuous Continuous Continuous    Liters per minute -- 2  on occasion will take off during rest 2 2    Compliance with Home Oxygen Use -- Yes Yes Yes      Goals/Expected Outcomes   Short Term Goals -- To learn and exhibit compliance with exercise, home and travel O2 prescription;To learn and understand importance of maintaining oxygen saturations>88%;To learn and demonstrate proper use of respiratory medications;To learn and understand importance of monitoring SPO2 with pulse oximeter and demonstrate accurate use of the pulse oximeter.;To learn and demonstrate proper pursed lip breathing techniques or other breathing techniques.  To learn and understand importance of maintaining oxygen saturations>88%;To learn and understand importance of monitoring SPO2 with pulse oximeter and demonstrate accurate use of the pulse oximeter. To learn and demonstrate proper pursed lip breathing techniques or other breathing techniques.     Long  Term Goals -- Verbalizes importance of monitoring SPO2 with pulse oximeter and return demonstration;Exhibits proper breathing techniques, such as pursed lip breathing or other method taught during  program session;Demonstrates proper use of MDI's;Compliance with respiratory medication;Maintenance of O2 saturations>88%;Exhibits compliance with exercise, home  and travel O2 prescription Maintenance of O2 saturations>88%;Verbalizes importance of monitoring SPO2 with pulse oximeter and return demonstration Exhibits proper breathing techniques, such as pursed lip breathing or other method taught during program session    Comments Reviewed PLB technique with pt.  Talked about how it works and it's importance in maintaining their exercise saturations. Arlethe is doing well in rehab. She is compliant with her oxygen and will use 4L with acitvity and 3-4 L at night.   During rest she will occassionaly take it off completely and still be able to maintain her saturations.  She is good about using her PLB to maintain her saturations when she notices it dropping.  She does several breathing exercises as night to help her breathing.  She does note that she has been coughing more and her doctor did start her on Mucinex to help clear muscus and cough. She has a pulse oximeter to check her oxygen saturation at home. Informed and explained why it is important to have one. Reviewed that oxygen saturations should be 88 percent and above. Patient verbalizes understanding. Diaphragmatic and PLB breathing explained and performed with patient. Patient has a better understanding of how to do these exercises to help with breathing performance and relaxation. Patient performed breathing techniques adequately and to practice further at home.    Goals/Expected Outcomes Short: Become more profiecient at using PLB.   Long: Become independent at using PLB. Short: Continue to monitor saturations and cough Long; Continue to use PLB Short: monitor oxygen at home with exertion. Long: maintain oxygen saturations above 88 percent independently. Short: practice PLB and diaphragmatic breathing at home. Long: Use PLB and diaphragmatic breathing  independently post [name].             Oxygen Discharge (Final Oxygen Re-Evaluation):  Oxygen Re-Evaluation - 01/25/23 1602       Program Oxygen Prescription   Program Oxygen Prescription Continuous    Liters per minute 4      Home Oxygen   Home Oxygen Device Home Concentrator;Portable Concentrator;E-Tanks  Sleep Oxygen Prescription Continuous    Liters per minute 4    Liters per minute 4    Home Resting Oxygen Prescription Continuous    Liters per minute 2    Compliance with Home Oxygen Use Yes      Goals/Expected Outcomes   Short Term Goals To learn and demonstrate proper pursed lip breathing techniques or other breathing techniques.     Long  Term Goals Exhibits proper breathing techniques, such as pursed lip breathing or other method taught during program session    Comments Diaphragmatic and PLB breathing explained and performed with patient. Patient has a better understanding of how to do these exercises to help with breathing performance and relaxation. Patient performed breathing techniques adequately and to practice further at home.    Goals/Expected Outcomes Short: practice PLB and diaphragmatic breathing at home. Long: Use PLB and diaphragmatic breathing independently post [name].             Initial Exercise Prescription:  Initial Exercise Prescription - 11/02/22 1700       Date of Initial Exercise RX and Referring Provider   Date 11/02/22    Referring Provider Kloefkorn      Oxygen   Oxygen Continuous    Liters 4    Maintain Oxygen Saturation 88% or higher      Treadmill   MPH 1    Grade 0    Minutes 15    METs 1.8      REL-XR   Level 1    Speed 50    Minutes 15    METs 1.48      Biostep-RELP   Level 1    SPM 50    Minutes 15    METs 1.48      Track   Laps 10    Minutes 15    METs 1.54      Prescription Details   Frequency (times per week) 2    Duration Progress to 30 minutes of continuous aerobic without signs/symptoms  of physical distress      Intensity   THRR 40-80% of Max Heartrate 97-125    Ratings of Perceived Exertion 11-13    Perceived Dyspnea 0-4      Progression   Progression Continue to progress workloads to maintain intensity without signs/symptoms of physical distress.      Resistance Training   Training Prescription Yes    Weight 2    Reps 10-15             Perform Capillary Blood Glucose checks as needed.  Exercise Prescription Changes:   Exercise Prescription Changes     Row Name 11/02/22 1700 11/16/22 1400 11/23/22 1600 11/30/22 1300 12/15/22 1500     Response to Exercise   Blood Pressure (Admit) 122/72 130/70 -- 128/74 124/60   Blood Pressure (Exercise) 124/72 136/58 -- 120/68 118/64   Blood Pressure (Exit) 114/62 124/72 -- 130/76 118/60   Heart Rate (Admit) 69 bpm 67 bpm -- 68 bpm 69 bpm   Heart Rate (Exercise) 104 bpm 105 bpm -- 105 bpm 87 bpm   Heart Rate (Exit) 72 bpm 78 bpm -- 90 bpm 75 bpm   Oxygen Saturation (Admit) 96 % 98 % -- 98 % 96 %   Oxygen Saturation (Exercise) 85 % 88 % -- 90 % 88 %   Oxygen Saturation (Exit) 97 % 96 % -- 95 % 93 %   Rating of Perceived Exertion (Exercise) 13 13 -- 12 12  Perceived Dyspnea (Exercise) 2 1 -- 2 1   Symptoms none SOB -- SOB SOB   Comments 6 MWT results First three days of exercise -- -- --   Duration -- Progress to 30 minutes of  aerobic without signs/symptoms of physical distress -- Progress to 30 minutes of  aerobic without signs/symptoms of physical distress Progress to 30 minutes of  aerobic without signs/symptoms of physical distress   Intensity -- THRR unchanged -- THRR unchanged THRR unchanged     Progression   Progression -- Continue to progress workloads to maintain intensity without signs/symptoms of physical distress. -- Continue to progress workloads to maintain intensity without signs/symptoms of physical distress. Continue to progress workloads to maintain intensity without signs/symptoms of physical  distress.   Average METs -- 1.7 -- 1.5 2.2     Resistance Training   Training Prescription -- Yes -- Yes Yes   Weight -- 2 lb -- 2 lb 2 lb   Reps -- 10-15 -- 10-15 10-15     Interval Training   Interval Training -- No -- No No     Oxygen   Oxygen -- Continuous -- Continuous Continuous   Liters -- 4 -- 4 4     Treadmill   MPH -- 1.2 -- -- --   Grade -- 0 -- -- --   Minutes -- 15 -- -- --   METs -- 1.92 -- -- --     NuStep   Level -- 1 -- 1 4   Minutes -- 15 -- 30 30   METs -- 1.5 -- 1.6 2.3     T5 Nustep   Level -- -- -- 1 --   Minutes -- -- -- 15 --   METs -- -- -- 1.7 --     Biostep-RELP   Level -- 1 -- -- --   Minutes -- 15 -- -- --   METs -- 2 -- -- --     Home Exercise Plan   Plans to continue exercise at -- -- Home (comment)  walking Home (comment)  walking Home (comment)  walking   Frequency -- -- Add 2 additional days to program exercise sessions. Add 2 additional days to program exercise sessions. Add 2 additional days to program exercise sessions.   Initial Home Exercises Provided -- -- 11/23/22 11/23/22 11/23/22     Oxygen   Maintain Oxygen Saturation -- 88% or higher -- 88% or higher 88% or higher    Row Name 12/30/22 1500 01/13/23 0700 01/28/23 1000 02/09/23 1400       Response to Exercise   Blood Pressure (Admit) 118/66 120/62 128/70 124/66    Blood Pressure (Exercise) 130/72 -- -- --    Blood Pressure (Exit) 122/64 130/82 124/80 138/70    Heart Rate (Admit) 69 bpm 72 bpm 70 bpm 60 bpm    Heart Rate (Exercise) 101 bpm 102 bpm 105 bpm 102 bpm    Heart Rate (Exit) 89 bpm 73 bpm 81 bpm 70 bpm    Oxygen Saturation (Admit) 94 % 95 % 98 % 94 %    Oxygen Saturation (Exercise) 88 % 88 % 88 % 89 %    Oxygen Saturation (Exit) 93 % 95 % 97 % 97 %    Rating of Perceived Exertion (Exercise) 16 14 13 14     Perceived Dyspnea (Exercise) 3 3 3 3     Symptoms SOB SOB SOB SOB    Duration Progress to 30 minutes of  aerobic without signs/symptoms  of physical  distress Progress to 30 minutes of  aerobic without signs/symptoms of physical distress Progress to 30 minutes of  aerobic without signs/symptoms of physical distress Continue with 30 min of aerobic exercise without signs/symptoms of physical distress.    Intensity THRR unchanged THRR unchanged THRR unchanged THRR unchanged      Progression   Progression Continue to progress workloads to maintain intensity without signs/symptoms of physical distress. Continue to progress workloads to maintain intensity without signs/symptoms of physical distress. Continue to progress workloads to maintain intensity without signs/symptoms of physical distress. Continue to progress workloads to maintain intensity without signs/symptoms of physical distress.    Average METs 1.96 2.1 2.13 2.41      Resistance Training   Training Prescription Yes Yes Yes Yes    Weight 2 lb 2 lb 2 lb 2 lb    Reps 10-15 10-15 10-15 10-15      Interval Training   Interval Training No No No No      Oxygen   Oxygen Continuous Continuous Continuous Continuous    Liters 4 4 4 4       Treadmill   MPH -- 1.2 1.5 --    Grade -- 0 0 --    Minutes -- 15 15 --    METs -- 1.92 2.15 --      NuStep   Level 4 5 -- 4    Minutes 15 15 -- 15    METs 1.7 2 -- 1.8      REL-XR   Level 1 1 2 2     Minutes 15 15 15 15     METs 3.9 3.4 3.3 3.9      T5 Nustep   Level 1 1 2 2     Minutes 15 15 15 15     METs 3.9 2 1.7 1.8      Track   Laps 12 -- 20 15    Minutes 15 -- 15 15    METs 1.65 -- 2.09 1.82      Home Exercise Plan   Plans to continue exercise at Home (comment)  walking Home (comment)  walking Home (comment)  walking Home (comment)  walking    Frequency Add 2 additional days to program exercise sessions. Add 2 additional days to program exercise sessions. Add 2 additional days to program exercise sessions. Add 2 additional days to program exercise sessions.    Initial Home Exercises Provided 11/23/22 11/23/22 11/23/22 11/23/22       Oxygen   Maintain Oxygen Saturation 88% or higher 88% or higher 88% or higher 88% or higher             Exercise Comments:   Exercise Comments     Row Name 11/04/22 1411           Exercise Comments First full day of exercise!  Patient was oriented to gym and equipment including functions, settings, policies, and procedures.  Patient's individual exercise prescription and treatment plan were reviewed.  All starting workloads were established based on the results of the 6 minute walk test done at initial orientation visit.  The plan for exercise progression was also introduced and progression will be customized based on patient's performance and goals.                Exercise Goals and Review:   Exercise Goals     Row Name 11/02/22 1731             Exercise Goals  Increase Physical Activity Yes       Intervention Provide advice, education, support and counseling about physical activity/exercise needs.;Develop an individualized exercise prescription for aerobic and resistive training based on initial evaluation findings, risk stratification, comorbidities and participant's personal goals.       Expected Outcomes Short Term: Attend rehab on a regular basis to increase amount of physical activity.;Long Term: Add in home exercise to make exercise part of routine and to increase amount of physical activity.;Long Term: Exercising regularly at least 3-5 days a week.       Increase Strength and Stamina Yes       Intervention Provide advice, education, support and counseling about physical activity/exercise needs.;Develop an individualized exercise prescription for aerobic and resistive training based on initial evaluation findings, risk stratification, comorbidities and participant's personal goals.       Expected Outcomes Short Term: Increase workloads from initial exercise prescription for resistance, speed, and METs.;Short Term: Perform resistance training exercises routinely  during rehab and add in resistance training at home;Long Term: Improve cardiorespiratory fitness, muscular endurance and strength as measured by increased METs and functional capacity ( )       Able to understand and use rate of perceived exertion (RPE) scale Yes       Intervention Provide education and explanation on how to use RPE scale       Expected Outcomes Short Term: Able to use RPE daily in rehab to express subjective intensity level;Long Term:  Able to use RPE to guide intensity level when exercising independently       Able to understand and use Dyspnea scale Yes       Intervention Provide education and explanation on how to use Dyspnea scale       Expected Outcomes Short Term: Able to use Dyspnea scale daily in rehab to express subjective sense of shortness of breath during exertion;Long Term: Able to use Dyspnea scale to guide intensity level when exercising independently       Knowledge and understanding of Target Heart Rate Range (THRR) Yes       Intervention Provide education and explanation of THRR including how the numbers were predicted and where they are located for reference       Expected Outcomes Short Term: Able to state/look up THRR;Long Term: Able to use THRR to govern intensity when exercising independently;Short Term: Able to use daily as guideline for intensity in rehab       Able to check pulse independently Yes       Intervention Provide education and demonstration on how to check pulse in carotid and radial arteries.;Review the importance of being able to check your own pulse for safety during independent exercise       Expected Outcomes Short Term: Able to explain why pulse checking is important during independent exercise       Understanding of Exercise Prescription Yes       Intervention Provide education, explanation, and written materials on patient's individual exercise prescription       Expected Outcomes Short Term: Able to explain program exercise  prescription;Long Term: Able to explain home exercise prescription to exercise independently                Exercise Goals Re-Evaluation :  Exercise Goals Re-Evaluation     Row Name 11/04/22 1411 11/16/22 1426 11/23/22 1545 11/30/22 1401 12/15/22 1525     Exercise Goal Re-Evaluation   Exercise Goals Review Increase Physical Activity;Able to understand and use rate of  perceived exertion (RPE) scale;Knowledge and understanding of Target Heart Rate Range (THRR);Understanding of Exercise Prescription;Increase Strength and Stamina;Able to understand and use Dyspnea scale;Able to check pulse independently Increase Physical Activity;Increase Strength and Stamina;Understanding of Exercise Prescription Increase Physical Activity;Increase Strength and Stamina;Understanding of Exercise Prescription Increase Physical Activity;Increase Strength and Stamina;Understanding of Exercise Prescription Increase Physical Activity;Increase Strength and Stamina;Understanding of Exercise Prescription   Comments Reviewed RPE scale, THR and program prescription with pt today.  Pt voiced understanding and was given a copy of goals to take home. Chastina is off to a good start in the program. She has done well with the T4 nustep and biostep at level 1. She also was able to walk up to 1.2 mph on the treadmill with no incline. She had an overall average MET level of 1.7 METs during her first three sessions of rehab. We will continue to monitor her progress in the program. Loann is doing well in rehab.  She was out on vacation for two weeks.  She did walk some with her cane and her rollator.  She has noticed that her saturations will drop with walking so she will need to take a rest break to do her PLB to help hre recover. She was grateful to be able ot get away.  She also did her PT exercises and strength work while away.  eviewed home exercise with pt today.  Pt plans to walk at home for exercise.  Reviewed THR, pulse, RPE, sign and  symptoms, pulse oximetery and when to call 911 or MD.  Also discussed weather considerations and indoor options.  Pt voiced understanding. Also reviewed goals with patient.  Service time [phone] Jamisa continues to do well in rehab. She remains at level 1 on the T4 and T5 Nustep, we will encourage patient to increase her workload on the seated machines. Her RPEs are staying appropriate. We will continue to monitor. Devonn is doing well in rehab since returning after having a fall. During her one session in rehab since the last review she was able to work on the T4 nustep for 30 minutes. She was also able to improve her workload up to level 4 on the T4. She has continued to do well with 2 lb hand weights for resistance training as well. We will continue to monitor her progress in the program.   Expected Outcomes Short: Use RPE daily to regulate intensity.  Long: Follow program prescription in THR. Short: Continue to follow current exercise prescription. Long: Continue to improve strength and stamina. Shrot: Back to routine exercise in rehab Long; Conitnue to improve stamina Short: Increase to level 2 on the T4 Nustep Long: Continue to increase overall MET level and stamina Short: Return to regular attendance in rehab. Long: Continue to increase overall MET level and stamina.    Row Name 12/30/22 1607 01/13/23 0755 01/28/23 1027 02/09/23 1445       Exercise Goal Re-Evaluation   Exercise Goals Review Increase Physical Activity;Increase Strength and Stamina;Understanding of Exercise Prescription Increase Physical Activity;Increase Strength and Stamina;Understanding of Exercise Prescription Increase Physical Activity;Increase Strength and Stamina;Understanding of Exercise Prescription Increase Physical Activity;Increase Strength and Stamina;Understanding of Exercise Prescription    Comments Addelynn is doing well in rehab. She has kept her workloads consistent on all seated machines. She was able to increase her number  of laps on the track from 10 to 12 laps. We will continue to monitor her progress in the program. Fuller Song is doing well in rehab. She recently improved  to level 5 on the T4 nustep and began using the treadmill again at a speed of 1.2 mph with no incline. Her O2 saturations have also continued to stay above 88% during exercise. We will continue to monitor her progress in the program. Jinny continues to do well in rehab. She recently improved to level 2 on the T5 and XR. She also increased her treadmill workload to a speed of 1.5 mph with no incline. She increased her laps on the track to 20 laps. We will continue to monitor her progress in the program. Jaquayla is doing well in rehab. Her laps on the track were down from 20 to 15 laps since the last review. However, her workloads on the T4 nustep, T5 nustep, and XR have stayed consistent. We will continue to monitor her progress in the program.    Expected Outcomes Short: Continue to push for more laps on track. Long: Continue to improve strength and stamina. Short: Continue to progressively increase treadmill speed. Long: Continue to improve strength and stamina. Short: Continue to progressively increase treadmill workload. Long: Continue exercise to improve strength and stamina. Short: Increase laps on the track to previous workload. Long: Continue exercise to improve strength and stamina.             Discharge Exercise Prescription (Final Exercise Prescription Changes):  Exercise Prescription Changes - 02/09/23 1400       Response to Exercise   Blood Pressure (Admit) 124/66    Blood Pressure (Exit) 138/70    Heart Rate (Admit) 60 bpm    Heart Rate (Exercise) 102 bpm    Heart Rate (Exit) 70 bpm    Oxygen Saturation (Admit) 94 %    Oxygen Saturation (Exercise) 89 %    Oxygen Saturation (Exit) 97 %    Rating of Perceived Exertion (Exercise) 14    Perceived Dyspnea (Exercise) 3    Symptoms SOB    Duration Continue with 30 min of aerobic exercise  without signs/symptoms of physical distress.    Intensity THRR unchanged      Progression   Progression Continue to progress workloads to maintain intensity without signs/symptoms of physical distress.    Average METs 2.41      Resistance Training   Training Prescription Yes    Weight 2 lb    Reps 10-15      Interval Training   Interval Training No      Oxygen   Oxygen Continuous    Liters 4      NuStep   Level 4    Minutes 15    METs 1.8      REL-XR   Level 2    Minutes 15    METs 3.9      T5 Nustep   Level 2    Minutes 15    METs 1.8      Track   Laps 15    Minutes 15    METs 1.82      Home Exercise Plan   Plans to continue exercise at Home (comment)   walking   Frequency Add 2 additional days to program exercise sessions.    Initial Home Exercises Provided 11/23/22      Oxygen   Maintain Oxygen Saturation 88% or higher             Nutrition:  Target Goals: Understanding of nutrition guidelines, daily intake of sodium 1500mg , cholesterol 200mg , calories 30% from fat and 7% or less from saturated fats, daily  to have 5 or more servings of fruits and vegetables.  Education: All About Nutrition: -Group instruction provided by verbal, written material, interactive activities, discussions, models, and posters to present general guidelines for heart healthy nutrition including fat, fiber, MyPlate, the role of sodium in heart healthy nutrition, utilization of the nutrition label, and utilization of this knowledge for meal planning. Follow up email sent as well. Written material given at graduation. Flowsheet Row Pulmonary Rehab from 12/25/2020 in [facility] Cardiac and Pulmonary Rehab  Date 11/20/20  Educator [facility]  Instruction Review Code 1- Verbalizes Understanding       Biometrics:  Pre Biometrics - 11/02/22 1732       Pre Biometrics   Height 5\' 2"  (1.575 m)    Weight 129 lb (58.5 kg)    Waist Circumference 33 inches    Hip Circumference 39 inches     Waist to Hip Ratio 0.85 %    BMI (Calculated) 23.59    Single Leg Stand 2.78 seconds              Nutrition Therapy Plan and Nutrition Goals:  Nutrition Therapy & Goals - 01/27/23 1609       Nutrition Therapy   Diet Mediterranean, low Na    Protein (specify units) 90    Fiber 25 grams    Whole Grain Foods 3 servings    Saturated Fats 15 max. grams    Fruits and Vegetables 5 servings/day    Sodium 2 grams      Personal Nutrition Goals   Nutrition Goal Eat smaller more frequent meals    Personal Goal #2 Pair carbs with protein/fat    Comments Patient drinking mostly water. Reviewed 24hr food recall. She struggles to get larger meals in, her husband wants to make sure she gets her nutrition in and makes her large meals with good foods. Talked about her food choices are good, but that eating smaller more frequent meals will help her get more nutrition in without feeling bloated or overwhelmed with the quantity of food. Educated on healthy fats, their nutrient and calorie density. Encouraged her to break up her large breakfast into smaller meals/snacks with complex carbs paired with quality protein or healthy fat. Reviewed mediterranean diet handout, built out several meals and snacks with foods she likes and will eat. 24-hours Recall:  B: scrambled eggs or fried egg on toast, fruits like berries/cherries, crunchy peanut butter toast, drinks premier protein shake  L: half tomato sandwich  D: pinto beans, cabbage, left over chicken pie, dinner roll      Intervention Plan   Intervention Nutrition handout(s) given to patient.;Prescribe, educate and counsel regarding individualized specific dietary modifications aiming towards targeted core components such as weight, hypertension, lipid management, diabetes, heart failure and other comorbidities.    Expected Outcomes Short Term Goal: Understand basic principles of dietary content, such as calories, fat, sodium, cholesterol and nutrients.;Short  Term Goal: A plan has been developed with personal nutrition goals set during dietitian appointment.;Long Term Goal: Adherence to prescribed nutrition plan.             Nutrition Assessments:  MEDIFICTS Score Key: ?70 Need to make dietary changes  40-70 Heart Healthy Diet ? 40 Therapeutic Level Cholesterol Diet  Flowsheet Row Pulmonary Rehab from 11/02/2022 in [facility] Cardiac and Pulmonary Rehab  Picture Your Plate Total Score on Admission 81      Picture Your Plate Scores: <32 Unhealthy dietary pattern with much room for improvement. 41-50 Dietary pattern unlikely  to meet recommendations for good health and room for improvement. 51-60 More healthful dietary pattern, with some room for improvement.  >60 Healthy dietary pattern, although there may be some specific behaviors that could be improved.   Nutrition Goals Re-Evaluation:  Nutrition Goals Re-Evaluation     Row Name 11/23/22 1551 12/10/22 1603 01/25/23 1606         Goals   Current Weight -- 130 lb (59 kg) --     Nutrition Goal Schedule appt with dietitian (husband diabetic) Eat smaller meals Meet with dietitian     Comment Janajah would like to set up an appointment to meet with dietitian.  She would like to work on her diet and her husbands as well.  She is working on watching sodium and getting in a good variety. Patient was informed on why it is important to maintain a balanced diet when dealing with Respiratory issues. Explained that it takes a lot of energy to breath and when they are short of breath often they will need to have a good diet to help keep up with the calories they are expending for breathing. Kenlea would like to meet with RD.     Expected Outcome Short: Set up dietitian appt Long: continue to work diet Short: Choose and plan snacks accordingly to patients caloric intake to improve breathing. Long: Maintain a diet independently that meets their caloric intake to aid in daily shortness of breath. Short: meet with  RD. Long: adhere to a diet to pertain to her.              Nutrition Goals Discharge (Final Nutrition Goals Re-Evaluation):  Nutrition Goals Re-Evaluation - 01/25/23 1606       Goals   Nutrition Goal Meet with dietitian    Comment Darcel would like to meet with RD.    Expected Outcome Short: meet with RD. Long: adhere to a diet to pertain to her.             Psychosocial: Target Goals: Acknowledge presence or absence of significant depression and/or stress, maximize coping skills, provide positive support system. Participant is able to verbalize types and ability to use techniques and skills needed for reducing stress and depression.   Education: Stress, Anxiety, and Depression - Group verbal and visual presentation to define topics covered.  Reviews how body is impacted by stress, anxiety, and depression.  Also discusses healthy ways to reduce stress and to treat/manage anxiety and depression.  Written material given at graduation. Flowsheet Row Pulmonary Rehab from 12/25/2020 in [facility] Cardiac and Pulmonary Rehab  Date 10/16/20  Educator [facility]  Instruction Review Code 1- [employer] Understanding       Education: Sleep Hygiene -Provides group verbal and written instruction about how sleep can affect your health.  Define sleep hygiene, discuss sleep cycles and impact of sleep habits. Review good sleep hygiene tips.  Flowsheet Row Cardiac Rehab from 05/30/2018 in [facility] Cardiac and Pulmonary Rehab  Date 03/16/18  Educator [facility]  Instruction Review Code 1- Verbalizes Understanding       Initial Review & Psychosocial Screening:  Initial Psych Review & Screening - 10/28/22 1352       Initial Review   Current issues with Current Anxiety/Panic      Family Dynamics   Good Support System? Yes    Comments Dessa can look to her husband and her two daughters for support. Her daughters call her almost everyday. She also has alot of faith that gets her through alot.  Barriers    Psychosocial barriers to participate in program The patient should benefit from training in stress management and relaxation.      Screening Interventions   Interventions To provide support and resources with identified psychosocial needs;Encouraged to exercise;Provide feedback about the scores to participant    Expected Outcomes Short Term goal: Utilizing psychosocial counselor, staff and physician to assist with identification of specific Stressors or current issues interfering with healing process. Setting desired goal for each stressor or current issue identified.;Long Term Goal: Stressors or current issues are controlled or eliminated.;Short Term goal: Identification and review with participant of any Quality of Life or Depression concerns found by scoring the questionnaire.;Long Term goal: The participant improves quality of Life and PHQ9 Scores as seen by post scores and/or verbalization of changes             Quality of Life Scores:  Scores of 19 and below usually indicate a poorer quality of life in these areas.  A difference of  2-3 points is a clinically meaningful difference.  A difference of 2-3 points in the total score of the Quality of Life Index has been associated with significant improvement in overall quality of life, self-image, physical symptoms, and general health in studies assessing change in quality of life.  PHQ-9: Review Flowsheet  More data may exist      11/02/2022 01/02/2021 09/02/2020 05/30/2018 03/08/2018  Depression screen PHQ 2/9  Decreased Interest 0 0 0 0 1  Down, Depressed, Hopeless 0 0 0 0 0  PHQ - 2 Score 0 0 0 0 1  Altered sleeping 1 1 - - 0  Tired, decreased energy 1 1 1 1 1   Change in appetite - 0 1 0 0  Feeling bad or failure about yourself  0 0 0 0 0  Trouble concentrating 0 0 0 0 0  Moving slowly or fidgety/restless 0 0 0 0 0  Suicidal thoughts 0 0 0 0 0  PHQ-9 Score 2 2 - - 2  Difficult doing work/chores - Not difficult at all Not difficult  at all - Not difficult at all    Details           Interpretation of Total Score  Total Score Depression Severity:  1-4 = Minimal depression, 5-9 = Mild depression, 10-14 = Moderate depression, 15-19 = Moderately severe depression, 20-27 = Severe depression   Psychosocial Evaluation and Intervention:  Psychosocial Evaluation - 10/28/22 1353       Psychosocial Evaluation & Interventions   Interventions Encouraged to exercise with the program and follow exercise prescription;Relaxation education;Stress management education    Comments Dannya can look to her husband and her two daughters for support. Her daughters call her almost everyday. She also has alot of faith that gets her through alot.    Expected Outcomes Short: Start [name] to help with mood. Long: Maintain a healthy mental state.    Continue Psychosocial Services  Follow up required by staff             Psychosocial Re-Evaluation:  Psychosocial Re-Evaluation     Row Name 11/23/22 1548 12/10/22 1605           Psychosocial Re-Evaluation   Current issues with Current Stress Concerns None Identified      Comments Irisa returned today from a two week vacation.  She got to go to [facility] for vacation.  She enjoyed going away as this was the first time she was able to get  away since first getting sick.  She enjoyed getting out and seeing some of the sights and going through gardens as well. She continues to sleep well and feels good overall.  Her biggest frustration is not beinig able to breathe as well with acitvity.  She also admits to forgetting to breathe on occassion when doing things. Patient reports no issues with their current mental states, sleep, stress, depression or anxiety. Will follow up with patient in a few weeks for any changes.      Expected Outcomes short: COntinue to exercise for mood boost Long: Conitnue to stay positive Short: Continue to exercise regularly to support mental health and notify  staff of any changes. Long: maintain mental health and well being through teaching of rehab or prescribed medications independently.      Interventions Encouraged to attend Pulmonary Rehabilitation for the exercise Encouraged to attend Pulmonary Rehabilitation for the exercise      Continue Psychosocial Services  -- Follow up required by staff               Psychosocial Discharge (Final Psychosocial Re-Evaluation):  Psychosocial Re-Evaluation - 12/10/22 1605       Psychosocial Re-Evaluation   Current issues with None Identified    Comments Patient reports no issues with their current mental states, sleep, stress, depression or anxiety. Will follow up with patient in a few weeks for any changes.    Expected Outcomes Short: Continue to exercise regularly to support mental health and notify staff of any changes. Long: maintain mental health and well being through teaching of rehab or prescribed medications independently.    Interventions Encouraged to attend Pulmonary Rehabilitation for the exercise    Continue Psychosocial Services  Follow up required by staff             Education: Education Goals: Education classes will be provided on a weekly basis, covering required topics. Participant will state understanding/return demonstration of topics presented.  Learning Barriers/Preferences:  Learning Barriers/Preferences - 10/28/22 1351       Learning Barriers/Preferences   Learning Barriers None    Learning Preferences None             General Pulmonary Education Topics:  Infection Prevention: - Provides verbal and written material to individual with discussion of infection control including proper hand washing and proper equipment cleaning during exercise session. Flowsheet Row Pulmonary Rehab from 01/27/2023 in [facility] Cardiac and Pulmonary Rehab  Date 11/02/22  Educator [facility]  Instruction Review Code 1- Verbalizes Understanding       Falls Prevention: - Provides verbal  and written material to individual with discussion of falls prevention and safety. Flowsheet Row Pulmonary Rehab from 01/27/2023 in [facility] Cardiac and Pulmonary Rehab  Date 11/02/22  Educator [facility]  Instruction Review Code 1- Verbalizes Understanding       Chronic Lung Disease Review: - Group verbal instruction with posters, models, PowerPoint presentations and videos,  to review new updates, new respiratory medications, new advancements in procedures and treatments. Providing information on websites and "800" numbers for continued self-education. Includes information about supplement oxygen, available portable oxygen systems, continuous and intermittent flow rates, oxygen safety, concentrators, and Medicare reimbursement for oxygen. Explanation of Pulmonary Drugs, including class, frequency, complications, importance of spacers, rinsing mouth after steroid MDI's, and proper cleaning methods for nebulizers. Review of basic lung anatomy and physiology related to function, structure, and complications of lung disease. Review of risk factors. Discussion about methods for diagnosing sleep apnea and types of masks  and machines for OSA. Includes a review of the use of types of environmental controls: home humidity, furnaces, filters, dust mite/pet prevention, HEPA vacuums. Discussion about weather changes, air quality and the benefits of nasal washing. Instruction on Warning signs, infection symptoms, calling MD promptly, preventive modes, and value of vaccinations. Review of effective airway clearance, coughing and/or vibration techniques. Emphasizing that all should Create an Action Plan. Written material given at graduation. Flowsheet Row Pulmonary Rehab from 01/27/2023 in [facility] Cardiac and Pulmonary Rehab  Education need identified 11/02/22  Date 11/25/22  Educator [facility]  Instruction Review Code 1- Verbalizes Understanding       AED/CPR: - Group verbal and written instruction with the use of models to  demonstrate the basic use of the AED with the basic ABC's of resuscitation.    Anatomy and Cardiac Procedures: - Group verbal and visual presentation and models provide information about basic cardiac anatomy and function. Reviews the testing methods done to diagnose heart disease and the outcomes of the test results. Describes the treatment choices: Medical Management, Angioplasty, or Coronary Bypass Surgery for treating various heart conditions including Myocardial Infarction, Angina, Valve Disease, and Cardiac Arrhythmias.  Written material given at graduation. Flowsheet Row Pulmonary Rehab from 12/25/2020 in [facility] Cardiac and Pulmonary Rehab  Date 11/06/20  Educator [facility]  Instruction Review Code 1- Verbalizes Understanding       Medication Safety: - Group verbal and visual instruction to review commonly prescribed medications for heart and lung disease. Reviews the medication, class of the drug, and side effects. Includes the steps to properly store meds and maintain the prescription regimen.  Written material given at graduation. Flowsheet Row Pulmonary Rehab from 01/27/2023 in [facility] Cardiac and Pulmonary Rehab  Date 01/20/23  Educator MS  Instruction Review Code 1- Verbalizes Understanding       Other: -Provides group and verbal instruction on various topics (see comments)   Knowledge Questionnaire Score:  Knowledge Questionnaire Score - 11/02/22 1733       Knowledge Questionnaire Score   Pre Score 17/18              Core Components/Risk Factors/Patient Goals at Admission:  Personal Goals and Risk Factors at Admission - 11/02/22 1735       Core Components/Risk Factors/Patient Goals on Admission    Weight Management Yes;Weight Maintenance    Intervention Weight Management: Develop a combined nutrition and exercise program designed to reach desired caloric intake, while maintaining appropriate intake of nutrient and fiber, sodium and fats, and appropriate energy  expenditure required for the weight goal.;Weight Management: Provide education and appropriate resources to help participant work on and attain dietary goals.;Weight Management/Obesity: Establish reasonable short term and long term weight goals.    Admit Weight 129 lb (58.5 kg)    Goal Weight: Short Term 129 lb (58.5 kg)    Goal Weight: Long Term 129 lb (58.5 kg)    Expected Outcomes Short Term: Continue to assess and modify interventions until short term weight is achieved;Long Term: Adherence to nutrition and physical activity/exercise program aimed toward attainment of established weight goal;Understanding recommendations for meals to include 15-35% energy as protein, 25-35% energy from fat, 35-60% energy from carbohydrates, less than 200mg  of dietary cholesterol, 20-35 gm of total fiber daily;Understanding of distribution of calorie intake throughout the day with the consumption of 4-5 meals/snacks;Weight Maintenance: Understanding of the daily nutrition guidelines, which includes 25-35% calories from fat, 7% or less cal from saturated fats, less than 200mg  cholesterol, less than 1.5gm  of sodium, & 5 or more servings of fruits and vegetables daily    Improve shortness of breath with ADL's Yes    Intervention Provide education, individualized exercise plan and daily activity instruction to help decrease symptoms of SOB with activities of daily living.    Expected Outcomes Short Term: Improve cardiorespiratory fitness to achieve a reduction of symptoms when performing ADLs;Long Term: Be able to perform more ADLs without symptoms or delay the onset of symptoms    Heart Failure Yes    Intervention Provide a combined exercise and nutrition program that is supplemented with education, support and counseling about heart failure. Directed toward relieving symptoms such as shortness of breath, decreased exercise tolerance, and extremity edema.    Expected Outcomes Improve functional capacity of life;Short term:  Attendance in program 2-3 days a week with increased exercise capacity. Reported lower sodium intake. Reported increased fruit and vegetable intake. Reports medication compliance.;Short term: Daily weights obtained and reported for increase. Utilizing diuretic protocols set by physician.;Long term: Adoption of self-care skills and reduction of barriers for early signs and symptoms recognition and intervention leading to self-care maintenance.    Hypertension Yes    Intervention Provide education on lifestyle modifcations including regular physical activity/exercise, weight management, moderate sodium restriction and increased consumption of fresh fruit, vegetables, and low fat dairy, alcohol moderation, and smoking cessation.;Monitor prescription use compliance.    Expected Outcomes Short Term: Continued assessment and intervention until BP is < 140/33mm HG in hypertensive participants. < 130/49mm HG in hypertensive participants with diabetes, heart failure or chronic kidney disease.;Long Term: Maintenance of blood pressure at goal levels.    Lipids Yes    Intervention Provide education and support for participant on nutrition & aerobic/resistive exercise along with prescribed medications to achieve LDL 70mg , HDL >40mg .    Expected Outcomes Short Term: Participant states understanding of desired cholesterol values and is compliant with medications prescribed. Participant is following exercise prescription and nutrition guidelines.;Long Term: Cholesterol controlled with medications as prescribed, with individualized exercise RX and with personalized nutrition plan. Value goals: LDL < 70mg , HDL > 40 mg.             Education:Diabetes - Individual verbal and written instruction to review signs/symptoms of diabetes, desired ranges of glucose level fasting, after meals and with exercise. Acknowledge that pre and post exercise glucose checks will be done for 3 sessions at entry of program.   Know Your  Numbers and Heart Failure: - Group verbal and visual instruction to discuss disease risk factors for cardiac and pulmonary disease and treatment options.  Reviews associated critical values for Overweight/Obesity, Hypertension, Cholesterol, and Diabetes.  Discusses basics of heart failure: signs/symptoms and treatments.  Introduces Heart Failure Zone chart for action plan for heart failure.  Written material given at graduation. Flowsheet Row Pulmonary Rehab from 01/27/2023 in [facility] Cardiac and Pulmonary Rehab  Date 01/27/23  Educator MS  Instruction Review Code 1- Verbalizes Understanding       Core Components/Risk Factors/Patient Goals Review:   Goals and Risk Factor Review     Row Name 11/23/22 1554 12/10/22 1602           Core Components/Risk Factors/Patient Goals Review   Personal Goals Review Weight Management/Obesity;Improve shortness of breath with ADL's;Increase knowledge of respiratory medications and ability to use respiratory devices properly.;Hypertension;Heart Failure Improve shortness of breath with ADL's      Review Nohemi is doing well in rehab.  She is up a little to 132 lb from  being at beach last two weeks.  She would like to get it back down to her 129 lb that she is normally at.  She is working on her breathing and does get short of breath with activity and her saturations will drop, especially when she forgets to breathe. She is doing well in her meds and uses her inhalers.  She will also do her breathing exercises at night to help.  She checks her pressures at home and they have been doing well. She does not have any heart failure symptoms currently and feels pretty good overall. Spoke to patient about their shortness of breath and what they can do to improve. Patient has been informed of breathing techniques when starting the program. Patient is informed to tell staff if they have had any med changes and that certain meds they are taking or not taking can be causing shortness  of breath.      Expected Outcomes short: Continue to work on getting vacation weight back down Long: conitnue to work on breathing Short: Attend [name] regularly to improve shortness of breath with ADL's. Long: maintain independence with ADL's               Core Components/Risk Factors/Patient Goals at Discharge (Final Review):   Goals and Risk Factor Review - 12/10/22 1602       Core Components/Risk Factors/Patient Goals Review   Personal Goals Review Improve shortness of breath with ADL's    Review Spoke to patient about their shortness of breath and what they can do to improve. Patient has been informed of breathing techniques when starting the program. Patient is informed to tell staff if they have had any med changes and that certain meds they are taking or not taking can be causing shortness of breath.    Expected Outcomes Short: Attend [name] regularly to improve shortness of breath with ADL's. Long: maintain independence with ADL's             ITP Comments:  ITP Comments     Row Name 10/28/22 1348 11/02/22 1725 11/04/22 1411 11/25/22 0829 12/23/22 0918   ITP Comments Virtual Visit completed. Patient informed on EP and RD appointment and 6 Minute walk test. Patient also informed of patient health questionnaires on My Chart. Patient Verbalizes understanding. Visit diagnosis can be found in [facility] 09/10/2022. Completed and gym orientation. Initial ITP created and sent for review to Dr. Jinny Sanders, Medical Director. First full day of exercise!  Patient was oriented to gym and equipment including functions, settings, policies, and procedures.  Patient's individual exercise prescription and treatment plan were reviewed.  All starting workloads were established based on the results of the 6 minute walk test done at initial orientation visit.  The plan for exercise progression was also introduced and progression will be customized based on patient's performance and goals. 30  Day review completed. Medical Director ITP review done, changes made as directed, and signed approval by Medical Director.   new to program 30 Day review completed. Medical Director ITP review done, changes made as directed, and signed approval by Medical Director.    Row Name 01/19/23 1418 02/17/23 1135         ITP Comments 30 Day review completed. Medical Director ITP review done, changes made as directed, and signed approval by Medical Director. 30 Day review completed. Medical Director ITP review done, changes made as directed, and signed approval by Medical Director.  Comments:

## 2023-02-23 ENCOUNTER — Ambulatory Visit: Payer: Medicare PPO

## 2023-02-24 ENCOUNTER — Ambulatory Visit
Admission: RE | Admit: 2023-02-24 | Discharge: 2023-02-24 | Disposition: A | Discharge status: Home / Self Care | Payer: Medicare PPO | Source: Ambulatory Visit | Attending: Radiation Oncology | Admitting: Radiation Oncology

## 2023-02-24 ENCOUNTER — Encounter: Payer: Medicare PPO | Admitting: *Deleted

## 2023-02-24 DIAGNOSIS — C44310 Basal cell carcinoma of skin of unspecified parts of face: Secondary | ICD-10-CM | POA: Insufficient documentation

## 2023-02-24 DIAGNOSIS — Z7901 Long term (current) use of anticoagulants: Secondary | ICD-10-CM | POA: Insufficient documentation

## 2023-02-24 DIAGNOSIS — I10 Essential (primary) hypertension: Secondary | ICD-10-CM | POA: Insufficient documentation

## 2023-02-24 DIAGNOSIS — I4891 Unspecified atrial fibrillation: Secondary | ICD-10-CM | POA: Insufficient documentation

## 2023-02-24 DIAGNOSIS — K219 Gastro-esophageal reflux disease without esophagitis: Secondary | ICD-10-CM | POA: Insufficient documentation

## 2023-02-24 DIAGNOSIS — Z79899 Other long term (current) drug therapy: Secondary | ICD-10-CM | POA: Insufficient documentation

## 2023-02-24 DIAGNOSIS — Z510 Encounter for antineoplastic radiation therapy: Secondary | ICD-10-CM | POA: Insufficient documentation

## 2023-02-24 DIAGNOSIS — J849 Interstitial pulmonary disease, unspecified: Secondary | ICD-10-CM | POA: Diagnosis not present

## 2023-02-24 DIAGNOSIS — J8410 Pulmonary fibrosis, unspecified: Secondary | ICD-10-CM | POA: Insufficient documentation

## 2023-02-24 DIAGNOSIS — Z952 Presence of prosthetic heart valve: Secondary | ICD-10-CM | POA: Insufficient documentation

## 2023-02-24 DIAGNOSIS — Z85820 Personal history of malignant melanoma of skin: Secondary | ICD-10-CM | POA: Insufficient documentation

## 2023-02-24 DIAGNOSIS — R011 Cardiac murmur, unspecified: Secondary | ICD-10-CM | POA: Insufficient documentation

## 2023-02-24 DIAGNOSIS — M129 Arthropathy, unspecified: Secondary | ICD-10-CM | POA: Insufficient documentation

## 2023-02-24 DIAGNOSIS — Z803 Family history of malignant neoplasm of breast: Secondary | ICD-10-CM | POA: Insufficient documentation

## 2023-02-24 NOTE — Progress Notes (Signed)
Daily Session Note  Patient Details  Name: Joyce Evans MRN: [id number] Date of Birth: 1940-07-20 Referring Provider:   Flowsheet Row Pulmonary Rehab from 11/02/2022 in [facility] Cardiac and Pulmonary Rehab  Referring Provider Kloefkorn       Encounter Date: 02/24/2023  Check In:  Session Check In - 02/24/23 1601       Check-In   Supervising physician immediately available to respond to emergencies See telemetry face sheet for immediately available ER MD    Location [facility]-Cardiac & Pulmonary Rehab    Staff Present Lanny Hurst, RN, ADN;Laureen Manson Passey, BS, RRT, CPFT; Jewel Baize, RN BSN    Virtual Visit No    Medication changes reported     No    Fall or balance concerns reported    No    Warm-up and Cool-down Performed on first and last piece of equipment    Resistance Training Performed Yes    VAD Patient? No    PAD/SET Patient? No      Pain Assessment   Currently in Pain? No/denies                Social History   Tobacco Use  Smoking Status Never  Smokeless Tobacco Never    Goals Met:  Independence with exercise equipment Exercise tolerated well No report of concerns or symptoms today Strength training completed today  Goals Unmet:  Not Applicable  Comments: Pt able to follow exercise prescription today without complaint.  Will continue to monitor for progression.    Dr. Bethann Punches is Medical Director for [facility] Cardiac Rehabilitation.  Dr. Vida Rigger is Medical Director for [facility] Pulmonary Rehabilitation.

## 2023-02-25 ENCOUNTER — Encounter: Payer: Medicare PPO | Admitting: *Deleted

## 2023-02-25 DIAGNOSIS — J849 Interstitial pulmonary disease, unspecified: Secondary | ICD-10-CM

## 2023-02-25 NOTE — Progress Notes (Signed)
Daily Session Note  Patient Details  Name: Joyce Evans MRN: [id number] Date of Birth: [birth year] Referring Provider:   Flowsheet Row Pulmonary Rehab from 11/02/2022 in [facility] Cardiac and Pulmonary Rehab  Referring Provider Kloefkorn       Encounter Date: 02/25/2023  Check In:  Session Check In - 02/25/23 1532       Check-In   Supervising physician immediately available to respond to emergencies See telemetry face sheet for immediately available ER MD    Location [facility]-Cardiac & Pulmonary Rehab    Staff Present Maxon Conetta BS, , Exercise Physiologist;Margaret Best, MS, Exercise Physiologist; Jewel Baize, RN BSN    Virtual Visit No    Medication changes reported     No    Fall or balance concerns reported    No    Warm-up and Cool-down Performed on first and last piece of equipment    Resistance Training Performed Yes    VAD Patient? No    PAD/SET Patient? No      Pain Assessment   Currently in Pain? No/denies                Social History   Tobacco Use  Smoking Status Never  Smokeless Tobacco Never    Goals Met:  Independence with exercise equipment Exercise tolerated well No report of concerns or symptoms today Strength training completed today  Goals Unmet:  Not Applicable  Comments: Pt able to follow exercise prescription today without complaint.  Will continue to monitor for progression.    Dr. Bethann Punches is Medical Director for [facility] Cardiac Rehabilitation.  Dr. Vida Rigger is Medical Director for [facility] Pulmonary Rehabilitation.

## 2023-03-01 ENCOUNTER — Encounter: Payer: Medicare PPO | Admitting: *Deleted

## 2023-03-01 DIAGNOSIS — J849 Interstitial pulmonary disease, unspecified: Secondary | ICD-10-CM | POA: Diagnosis not present

## 2023-03-01 DIAGNOSIS — Z510 Encounter for antineoplastic radiation therapy: Secondary | ICD-10-CM | POA: Diagnosis not present

## 2023-03-01 NOTE — Progress Notes (Signed)
Daily Session Note  Patient Details  Name: Joyce Evans MRN: [id number] Date of Birth: Nov 30, 1940 Referring Provider:   Flowsheet Row Pulmonary Rehab from 11/02/2022 in [facility] Cardiac and Pulmonary Rehab  Referring Provider Kloefkorn       Encounter Date: 03/01/2023  Check In:  Session Check In - 03/01/23 1536       Check-In   Supervising physician immediately available to respond to emergencies See telemetry face sheet for immediately available ER MD    Location [facility]-Cardiac & Pulmonary Rehab    Staff Present Maxon Conetta BS, , Exercise Physiologist;Laureen Pati Gallo, RRT, CPFT;Athira Janowicz Jewel Baize, RN BSN    Virtual Visit No    Medication changes reported     No    Fall or balance concerns reported    No    Warm-up and Cool-down Performed on first and last piece of equipment    Resistance Training Performed Yes    VAD Patient? No    PAD/SET Patient? No      Pain Assessment   Currently in Pain? No/denies                Social History   Tobacco Use  Smoking Status Never  Smokeless Tobacco Never    Goals Met:  Independence with exercise equipment Exercise tolerated well No report of concerns or symptoms today Strength training completed today  Goals Unmet:  Not Applicable  Comments: Pt able to follow exercise prescription today without complaint.  Will continue to monitor for progression.    Dr. Bethann Punches is Medical Director for [facility] Cardiac Rehabilitation.  Dr. Vida Rigger is Medical Director for [facility] Pulmonary Rehabilitation.

## 2023-03-03 ENCOUNTER — Encounter: Payer: Medicare PPO | Admitting: *Deleted

## 2023-03-03 DIAGNOSIS — J849 Interstitial pulmonary disease, unspecified: Secondary | ICD-10-CM

## 2023-03-03 NOTE — Progress Notes (Signed)
Daily Session Note  Patient Details  Name: Joyce Evans MRN: [id number] Date of Birth: 1941/01/25 Referring Provider:   Flowsheet Row Pulmonary Rehab from 11/02/2022 in [facility] Cardiac and Pulmonary Rehab  Referring Provider Kloefkorn       Encounter Date: 03/03/2023  Check In:  Session Check In - 03/03/23 1527       Check-In   Supervising physician immediately available to respond to emergencies See telemetry face sheet for immediately available ER MD    Location [facility]-Cardiac & Pulmonary Rehab    Staff Present Lanny Hurst, RN, ADN;Marinna Blane Jewel Baize, RN BSN;Susanne Bice, RN, BSN, CCRP    Virtual Visit No    Medication changes reported     No    Fall or balance concerns reported    No    Warm-up and Cool-down Performed on first and last piece of equipment    Resistance Training Performed Yes    VAD Patient? No    PAD/SET Patient? No      Pain Assessment   Currently in Pain? No/denies                Social History   Tobacco Use  Smoking Status Never  Smokeless Tobacco Never    Goals Met:  Independence with exercise equipment Exercise tolerated well No report of concerns or symptoms today Strength training completed today  Goals Unmet:  Not Applicable  Comments: Pt able to follow exercise prescription today without complaint.  Will continue to monitor for progression.    Dr. Bethann Punches is Medical Director for [name] Cardiac Rehabilitation.  Dr. Vida Rigger is Medical Director for [facility] Pulmonary Rehabilitation.

## 2023-03-04 ENCOUNTER — Other Ambulatory Visit: Payer: Self-pay

## 2023-03-04 ENCOUNTER — Ambulatory Visit
Admission: RE | Admit: 2023-03-04 | Discharge: 2023-03-04 | Disposition: A | Discharge status: Home / Self Care | Payer: Medicare PPO | Source: Ambulatory Visit | Attending: Radiation Oncology | Admitting: Radiation Oncology

## 2023-03-04 DIAGNOSIS — Z510 Encounter for antineoplastic radiation therapy: Secondary | ICD-10-CM | POA: Diagnosis not present

## 2023-03-04 LAB — RAD ONC ARIA SESSION SUMMARY
Course Elapsed Days: 0
Plan Fractions Treated to Date: 1
Plan Prescribed Dose Per Fraction: 2.2 Gy
Plan Total Fractions Prescribed: 20
Plan Total Prescribed Dose: 44 Gy
Reference Point Dosage Given to Date: 2.2 Gy
Reference Point Session Dosage Given: 2.2 Gy
Session Number: 1

## 2023-03-05 ENCOUNTER — Ambulatory Visit
Admission: RE | Admit: 2023-03-05 | Discharge: 2023-03-05 | Disposition: A | Discharge status: Home / Self Care | Payer: Medicare PPO | Source: Ambulatory Visit | Attending: Radiation Oncology | Admitting: Radiation Oncology

## 2023-03-05 ENCOUNTER — Other Ambulatory Visit: Payer: Self-pay

## 2023-03-05 DIAGNOSIS — Z510 Encounter for antineoplastic radiation therapy: Secondary | ICD-10-CM | POA: Diagnosis not present

## 2023-03-05 LAB — RAD ONC ARIA SESSION SUMMARY
Course Elapsed Days: 1
Plan Fractions Treated to Date: 2
Plan Prescribed Dose Per Fraction: 2.2 Gy
Plan Total Fractions Prescribed: 20
Plan Total Prescribed Dose: 44 Gy
Reference Point Dosage Given to Date: 4.4 Gy
Reference Point Session Dosage Given: 2.2 Gy
Session Number: 2

## 2023-03-08 ENCOUNTER — Encounter: Payer: Medicare PPO | Admitting: *Deleted

## 2023-03-08 ENCOUNTER — Other Ambulatory Visit: Payer: Self-pay

## 2023-03-08 ENCOUNTER — Ambulatory Visit
Admission: RE | Admit: 2023-03-08 | Discharge: 2023-03-08 | Disposition: A | Discharge status: Home / Self Care | Payer: Medicare PPO | Source: Ambulatory Visit | Attending: Radiation Oncology | Admitting: Radiation Oncology

## 2023-03-08 DIAGNOSIS — J849 Interstitial pulmonary disease, unspecified: Secondary | ICD-10-CM

## 2023-03-08 DIAGNOSIS — Z510 Encounter for antineoplastic radiation therapy: Secondary | ICD-10-CM | POA: Diagnosis not present

## 2023-03-08 LAB — RAD ONC ARIA SESSION SUMMARY
Course Elapsed Days: 4
Plan Fractions Treated to Date: 3
Plan Prescribed Dose Per Fraction: 2.2 Gy
Plan Total Fractions Prescribed: 20
Plan Total Prescribed Dose: 44 Gy
Reference Point Dosage Given to Date: 6.6 Gy
Reference Point Session Dosage Given: 2.2 Gy
Session Number: 3

## 2023-03-08 NOTE — Progress Notes (Signed)
Daily Session Note  Patient Details  Name: Joyce Evans MRN: [id number] Date of Birth: 1940/10/24 Referring Provider:   Flowsheet Row Pulmonary Rehab from 11/02/2022 in [facility] Cardiac and Pulmonary Rehab  Referring Provider Kloefkorn       Encounter Date: 03/08/2023  Check In:  Session Check In - 03/08/23 1548       Check-In   Supervising physician immediately available to respond to emergencies See telemetry face sheet for immediately available ER MD    Location [facility]-Cardiac & Pulmonary Rehab    Staff Present Cyndia Diver, RN, BSN, MA;Maxon Conetta BS, , Exercise Physiologist;Noah Tickle, BS, Exercise Physiologist;Joseph [place], [place]    Virtual Visit No    Medication changes reported     No    Fall or balance concerns reported    No    Tobacco Cessation No Change    Warm-up and Cool-down Performed on first and last piece of equipment    Resistance Training Performed Yes    VAD Patient? No    PAD/SET Patient? No      Pain Assessment   Currently in Pain? No/denies                Social History   Tobacco Use  Smoking Status Never  Smokeless Tobacco Never    Goals Met:  Independence with exercise equipment Exercise tolerated well No report of concerns or symptoms today  Goals Unmet:  Not Applicable  Comments: Pt able to follow exercise prescription today without complaint.  Will continue to monitor for progression.    Dr. Bethann Punches is Medical Director for [facility] Cardiac Rehabilitation.  Dr. Vida Rigger is Medical Director for [facility] Pulmonary Rehabilitation.

## 2023-03-09 ENCOUNTER — Other Ambulatory Visit: Payer: Self-pay

## 2023-03-09 ENCOUNTER — Ambulatory Visit
Admission: RE | Admit: 2023-03-09 | Discharge: 2023-03-09 | Disposition: A | Discharge status: Home / Self Care | Payer: Medicare PPO | Source: Ambulatory Visit | Attending: Radiation Oncology | Admitting: Radiation Oncology

## 2023-03-09 DIAGNOSIS — Z510 Encounter for antineoplastic radiation therapy: Secondary | ICD-10-CM | POA: Diagnosis not present

## 2023-03-09 LAB — RAD ONC ARIA SESSION SUMMARY
Course Elapsed Days: 5
Plan Fractions Treated to Date: 4
Plan Prescribed Dose Per Fraction: 2.2 Gy
Plan Total Fractions Prescribed: 20
Plan Total Prescribed Dose: 44 Gy
Reference Point Dosage Given to Date: 8.8 Gy
Reference Point Session Dosage Given: 2.2 Gy
Session Number: 4

## 2023-03-10 ENCOUNTER — Ambulatory Visit
Admission: RE | Admit: 2023-03-10 | Discharge: 2023-03-10 | Disposition: A | Discharge status: Home / Self Care | Payer: Medicare PPO | Source: Ambulatory Visit | Attending: Radiation Oncology | Admitting: Radiation Oncology

## 2023-03-10 ENCOUNTER — Encounter: Payer: Medicare PPO | Admitting: *Deleted

## 2023-03-10 ENCOUNTER — Other Ambulatory Visit: Payer: Self-pay

## 2023-03-10 DIAGNOSIS — J849 Interstitial pulmonary disease, unspecified: Secondary | ICD-10-CM

## 2023-03-10 DIAGNOSIS — Z510 Encounter for antineoplastic radiation therapy: Secondary | ICD-10-CM | POA: Diagnosis not present

## 2023-03-10 LAB — RAD ONC ARIA SESSION SUMMARY
Course Elapsed Days: 6
Plan Fractions Treated to Date: 5
Plan Prescribed Dose Per Fraction: 2.2 Gy
Plan Total Fractions Prescribed: 20
Plan Total Prescribed Dose: 44 Gy
Reference Point Dosage Given to Date: 11 Gy
Reference Point Session Dosage Given: 2.2 Gy
Session Number: 5

## 2023-03-10 NOTE — Progress Notes (Signed)
Daily Session Note  Patient Details  Name: Joyce Evans MRN: [id number] Date of Birth: 09/18/1940 Referring Provider:   Flowsheet Row Pulmonary Rehab from 11/02/2022 in [facility] Cardiac and Pulmonary Rehab  Referring Provider Kloefkorn       Encounter Date: 03/10/2023  Check In:  Session Check In - 03/10/23 1553       Check-In   Supervising physician immediately available to respond to emergencies See telemetry face sheet for immediately available ER MD    Location [facility]-Cardiac & Pulmonary Rehab    Staff Present Bess Kinds RN, BSN;Joseph Hood, RCP,RRT,BSRT;Maxon Union BS, , Exercise Physiologist    Virtual Visit No    Medication changes reported     No    Fall or balance concerns reported    No    Warm-up and Cool-down Performed on first and last piece of equipment    Resistance Training Performed Yes    VAD Patient? No    PAD/SET Patient? No      Pain Assessment   Currently in Pain? No/denies                Social History   Tobacco Use  Smoking Status Never  Smokeless Tobacco Never    Goals Met:  Independence with exercise equipment Exercise tolerated well No report of concerns or symptoms today Strength training completed today  Goals Unmet:  Not Applicable  Comments: Pt able to follow exercise prescription today without complaint.  Will continue to monitor for progression.    Dr. Bethann Punches is Medical Director for [facility] Cardiac Rehabilitation.  Dr. Vida Rigger is Medical Director for [facility] Pulmonary Rehabilitation.

## 2023-03-11 ENCOUNTER — Other Ambulatory Visit: Payer: Self-pay

## 2023-03-11 ENCOUNTER — Ambulatory Visit
Admission: RE | Admit: 2023-03-11 | Discharge: 2023-03-11 | Disposition: A | Discharge status: Home / Self Care | Payer: Medicare PPO | Source: Ambulatory Visit | Attending: Radiation Oncology | Admitting: Radiation Oncology

## 2023-03-11 DIAGNOSIS — Z510 Encounter for antineoplastic radiation therapy: Secondary | ICD-10-CM | POA: Diagnosis not present

## 2023-03-11 LAB — RAD ONC ARIA SESSION SUMMARY
Course Elapsed Days: 7
Plan Fractions Treated to Date: 6
Plan Prescribed Dose Per Fraction: 2.2 Gy
Plan Total Fractions Prescribed: 20
Plan Total Prescribed Dose: 44 Gy
Reference Point Dosage Given to Date: 13.2 Gy
Reference Point Session Dosage Given: 2.2 Gy
Session Number: 6

## 2023-03-12 ENCOUNTER — Other Ambulatory Visit: Payer: Self-pay

## 2023-03-12 ENCOUNTER — Ambulatory Visit
Admission: RE | Admit: 2023-03-12 | Discharge: 2023-03-12 | Disposition: A | Discharge status: Home / Self Care | Payer: Medicare PPO | Source: Ambulatory Visit | Attending: Radiation Oncology | Admitting: Radiation Oncology

## 2023-03-12 DIAGNOSIS — Z510 Encounter for antineoplastic radiation therapy: Secondary | ICD-10-CM | POA: Diagnosis not present

## 2023-03-12 LAB — RAD ONC ARIA SESSION SUMMARY
Course Elapsed Days: 8
Plan Fractions Treated to Date: 7
Plan Prescribed Dose Per Fraction: 2.2 Gy
Plan Total Fractions Prescribed: 20
Plan Total Prescribed Dose: 44 Gy
Reference Point Dosage Given to Date: 15.4 Gy
Reference Point Session Dosage Given: 2.2 Gy
Session Number: 7

## 2023-03-16 ENCOUNTER — Ambulatory Visit
Admission: RE | Admit: 2023-03-16 | Discharge: 2023-03-16 | Disposition: A | Discharge status: Home / Self Care | Payer: Medicare PPO | Source: Ambulatory Visit | Attending: Radiation Oncology | Admitting: Radiation Oncology

## 2023-03-16 ENCOUNTER — Other Ambulatory Visit: Payer: Self-pay

## 2023-03-16 DIAGNOSIS — Z510 Encounter for antineoplastic radiation therapy: Secondary | ICD-10-CM | POA: Insufficient documentation

## 2023-03-16 DIAGNOSIS — C44310 Basal cell carcinoma of skin of unspecified parts of face: Secondary | ICD-10-CM | POA: Insufficient documentation

## 2023-03-16 LAB — RAD ONC ARIA SESSION SUMMARY
Course Elapsed Days: 12
Plan Fractions Treated to Date: 8
Plan Prescribed Dose Per Fraction: 2.2 Gy
Plan Total Fractions Prescribed: 20
Plan Total Prescribed Dose: 44 Gy
Reference Point Dosage Given to Date: 17.6 Gy
Reference Point Session Dosage Given: 2.2 Gy
Session Number: 8

## 2023-03-17 ENCOUNTER — Other Ambulatory Visit: Payer: Self-pay

## 2023-03-17 ENCOUNTER — Encounter: Payer: Medicare PPO | Attending: Internal Medicine | Admitting: *Deleted

## 2023-03-17 ENCOUNTER — Encounter: Payer: Self-pay | Admitting: *Deleted

## 2023-03-17 ENCOUNTER — Ambulatory Visit: Admission: RE | Admit: 2023-03-17 | Payer: Medicare PPO | Source: Ambulatory Visit

## 2023-03-17 VITALS — Ht 62.0 in | Wt 131.3 lb

## 2023-03-17 DIAGNOSIS — Z510 Encounter for antineoplastic radiation therapy: Secondary | ICD-10-CM | POA: Diagnosis not present

## 2023-03-17 DIAGNOSIS — J849 Interstitial pulmonary disease, unspecified: Secondary | ICD-10-CM

## 2023-03-17 LAB — RAD ONC ARIA SESSION SUMMARY
Course Elapsed Days: 13
Plan Fractions Treated to Date: 9
Plan Prescribed Dose Per Fraction: 2.2 Gy
Plan Total Fractions Prescribed: 20
Plan Total Prescribed Dose: 44 Gy
Reference Point Dosage Given to Date: 19.8 Gy
Reference Point Session Dosage Given: 2.2 Gy
Session Number: 9

## 2023-03-17 NOTE — Progress Notes (Signed)
Daily Session Note  Patient Details  Name: Joyce Evans MRN: [id number] Date of Birth: [birth year] Referring Provider:   Flowsheet Row Pulmonary Rehab from 11/02/2022 in [facility] Cardiac and Pulmonary Rehab  Referring Provider Kloefkorn       Encounter Date: 03/17/2023  Check In:  Session Check In - 03/17/23 1549       Check-In   Supervising physician immediately available to respond to emergencies See telemetry face sheet for immediately available ER MD    Location [facility]-Cardiac & Pulmonary Rehab    Staff Present Tommye Standard, BS, ACSM CEP, Exercise Physiologist;Camil Wilhelmsen Jewel Baize, RN BSN;Joseph Reino Kent, RCP,RRT,BSRT    Virtual Visit No    Medication changes reported     No    Fall or balance concerns reported    No    Warm-up and Cool-down Performed on first and last piece of equipment    Resistance Training Performed Yes    VAD Patient? No    PAD/SET Patient? No      Pain Assessment   Currently in Pain? No/denies                Social History   Tobacco Use  Smoking Status Never  Smokeless Tobacco Never    Goals Met:  Independence with exercise equipment Exercise tolerated well No report of concerns or symptoms today Strength training completed today  Goals Unmet:  Not Applicable  Comments: Pt able to follow exercise prescription today without complaint.  Will continue to monitor for progression.    Dr. Bethann Punches is Medical Director for [facility] Cardiac Rehabilitation.  Dr. Vida Rigger is Medical Director for [facility] Pulmonary Rehabilitation.

## 2023-03-17 NOTE — Patient Instructions (Signed)
Discharge Patient Instructions  Patient Details  Name: Joyce Evans MRN: [id number] Date of Birth: 02-Jul-1941 Referring Provider:  Kandyce Rud, MD   Number of Visits: 49  Reason for Discharge:  Patient reached a stable level of exercise. Patient independent in their exercise. Patient has met program and personal goals.  Diagnosis:  ILD (interstitial lung disease) (HCC)  Initial Exercise Prescription:  Initial Exercise Prescription - 11/02/22 1700       Date of Initial Exercise RX and Referring Provider   Date 11/02/22    Referring Provider Kloefkorn      Oxygen   Oxygen Continuous    Liters 4    Maintain Oxygen Saturation 88% or higher      Treadmill   MPH 1    Grade 0    Minutes 15    METs 1.8      REL-XR   Level 1    Speed 50    Minutes 15    METs 1.48      Biostep-RELP   Level 1    SPM 50    Minutes 15    METs 1.48      Track   Laps 10    Minutes 15    METs 1.54      Prescription Details   Frequency (times per week) 2    Duration Progress to 30 minutes of continuous aerobic without signs/symptoms of physical distress      Intensity   THRR 40-80% of Max Heartrate 97-125    Ratings of Perceived Exertion 11-13    Perceived Dyspnea 0-4      Progression   Progression Continue to progress workloads to maintain intensity without signs/symptoms of physical distress.      Resistance Training   Training Prescription Yes    Weight 2    Reps 10-15             Discharge Exercise Prescription (Final Exercise Prescription Changes):  Exercise Prescription Changes - 03/08/23 1400       Response to Exercise   Blood Pressure (Admit) 128/70    Blood Pressure (Exercise) 164/80    Blood Pressure (Exit) 112/66    Heart Rate (Admit) 82 bpm    Heart Rate (Exercise) 107 bpm    Heart Rate (Exit) 80 bpm    Oxygen Saturation (Admit) 95 %    Oxygen Saturation (Exercise) 88 %    Oxygen Saturation (Exit) 94 %    Rating of Perceived Exertion  (Exercise) 14    Perceived Dyspnea (Exercise) 3    Symptoms SOB    Duration Continue with 30 min of aerobic exercise without signs/symptoms of physical distress.    Intensity THRR unchanged      Progression   Progression Continue to progress workloads to maintain intensity without signs/symptoms of physical distress.    Average METs 2.03      Resistance Training   Training Prescription Yes    Weight 2 lb    Reps 10-15      Interval Training   Interval Training No      Oxygen   Oxygen Continuous    Liters 4      NuStep   Level 5    Minutes 15    METs 2.2      REL-XR   Level 3    Minutes 15    METs 2.8      T5 Nustep   Level 4    Minutes 15    METs  2      Track   Laps 11    Minutes 15    METs 1.6      Home Exercise Plan   Plans to continue exercise at Home (comment)   walking   Frequency Add 2 additional days to program exercise sessions.    Initial Home Exercises Provided 11/23/22      Oxygen   Maintain Oxygen Saturation 88% or higher             Functional Capacity:  6 Minute Walk     Row Name 11/02/22 1726 03/17/23 1602       6 Minute Walk   Phase Initial Discharge    Distance 750 feet 780 feet    Distance % Change -- 4 %    Distance Feet Change -- 30 ft    Walk Time 6 minutes 6 minutes    # of Rest Breaks 0 0    MPH 1.42 1.48    METS 1.48 2.12    RPE 13 14    Perceived Dyspnea  2 3    VO2 Peak 5.16 7.43    Symptoms No Yes (comment)    Comments -- SOB    Resting HR 69 bpm 84 bpm    Resting BP 122/72 102/64    Resting Oxygen Saturation  96 % 97 %    Exercise Oxygen Saturation  during 6 min walk 85 % 85 %    Max Ex. HR 104 bpm 109 bpm    Max Ex. BP 124/72 154/82    2 Minute Post BP 114/62 128/68      Interval HR   1 Minute HR 89 77    2 Minute HR 88 92    3 Minute HR 97 109    4 Minute HR 99 106    5 Minute HR 102 97    6 Minute HR 104 95    2 Minute Post HR 60 92    Interval Heart Rate? Yes Yes      Interval Oxygen    Interval Oxygen? Yes Yes    Baseline Oxygen Saturation % 96 % 97 %    1 Minute Oxygen Saturation % 92 % 91 %    1 Minute Liters of Oxygen 4 L 4 L    2 Minute Oxygen Saturation % 87 % 85 %    2 Minute Liters of Oxygen 4 L 4 L    3 Minute Oxygen Saturation % 87 % 88 %    3 Minute Liters of Oxygen 4 L 4 L    4 Minute Oxygen Saturation % 85 % 90 %    4 Minute Liters of Oxygen 4 L 4 L    5 Minute Oxygen Saturation % 85 % 86 %    5 Minute Liters of Oxygen 4 L 4 L    6 Minute Oxygen Saturation % 86 % 86 %    6 Minute Liters of Oxygen 4 L 4 L    2 Minute Post Oxygen Saturation % 91 % 98 %    2 Minute Post Liters of Oxygen 4 L 4 L            Nutrition & Weight - Outcomes:  Pre Biometrics - 11/02/22 1732       Pre Biometrics   Height 5\' 2"  (1.575 m)    Weight 129 lb (58.5 kg)    Waist Circumference 33 inches    Hip Circumference 39 inches  Waist to Hip Ratio 0.85 %    BMI (Calculated) 23.59    Single Leg Stand 2.78 seconds             Post Biometrics - 03/17/23 1605        Post  Biometrics   Height 5\' 2"  (1.575 m)    Weight 131 lb 4.8 oz (59.6 kg)    Waist Circumference 33 inches    Hip Circumference 37 inches    Waist to Hip Ratio 0.89 %    BMI (Calculated) 24.01    Single Leg Stand 6.7 seconds             Nutrition:  Nutrition Therapy & Goals - 01/27/23 1609       Nutrition Therapy   Diet Mediterranean, low Na    Protein (specify units) 90    Fiber 25 grams    Whole Grain Foods 3 servings    Saturated Fats 15 max. grams    Fruits and Vegetables 5 servings/day    Sodium 2 grams      Personal Nutrition Goals   Nutrition Goal Eat smaller more frequent meals    Personal Goal #2 Pair carbs with protein/fat    Comments Patient drinking mostly water. Reviewed 24hr food recall. She struggles to get larger meals in, her husband wants to make sure she gets her nutrition in and makes her large meals with good foods. Talked about her food choices are good, but  that eating smaller more frequent meals will help her get more nutrition in without feeling bloated or overwhelmed with the quantity of food. Educated on healthy fats, their nutrient and calorie density. Encouraged her to break up her large breakfast into smaller meals/snacks with complex carbs paired with quality protein or healthy fat. Reviewed mediterranean diet handout, built out several meals and snacks with foods she likes and will eat. 24-hours Recall:  B: scrambled eggs or fried egg on toast, fruits like berries/cherries, crunchy peanut butter toast, drinks premier protein shake  L: half tomato sandwich  D: pinto beans, cabbage, left over chicken pie, dinner roll      Intervention Plan   Intervention Nutrition handout(s) given to patient.;Prescribe, educate and counsel regarding individualized specific dietary modifications aiming towards targeted core components such as weight, hypertension, lipid management, diabetes, heart failure and other comorbidities.    Expected Outcomes Short Term Goal: Understand basic principles of dietary content, such as calories, fat, sodium, cholesterol and nutrients.;Short Term Goal: A plan has been developed with personal nutrition goals set during dietitian appointment.;Long Term Goal: Adherence to prescribed nutrition plan.

## 2023-03-17 NOTE — Progress Notes (Signed)
Pulmonary Individual Treatment Plan  Patient Details  Name: Joyce Evans MRN: [id number] Date of Birth: 09-30-1940 Referring Provider:   Flowsheet Row Pulmonary Rehab from 11/02/2022 in [facility] Cardiac and Pulmonary Rehab  Referring Provider Kloefkorn       Initial Encounter Date:  Flowsheet Row Pulmonary Rehab from 11/02/2022 in [facility] Cardiac and Pulmonary Rehab  Date 11/02/22       Visit Diagnosis: ILD (interstitial lung disease) (HCC)  Patient's Home Medications on Admission:  Current Outpatient Medications:    acetaminophen (TYLENOL) 500 MG tablet, Take 500 mg by mouth every 6 (six) hours as needed., Disp: , Rfl:    apixaban (ELIQUIS) 5 MG TABS tablet, Take 5 mg by mouth 2 (two) times daily., Disp: , Rfl:    apixaban (ELIQUIS) 5 MG TABS tablet, Take 1 tablet by mouth 2 (two) times daily., Disp: , Rfl:    Apple Cider Vinegar 300 MG TABS, Take 1 tablet by mouth daily., Disp: , Rfl:    atorvastatin (LIPITOR) 40 MG tablet, Take by mouth., Disp: , Rfl:    atorvastatin (LIPITOR) 40 MG tablet, Take 1 tablet by mouth daily., Disp: , Rfl:    brimonidine (ALPHAGAN P) 0.1 % SOLN, Place 1 drop into the right eye 2 (two) times daily., Disp: , Rfl:    brimonidine (ALPHAGAN) 0.2 % ophthalmic solution, , Disp: , Rfl:    Brinzolamide-Brimonidine 1-0.2 % SUSP, Place 1 drop into the left eye 2 (two) times daily., Disp: , Rfl:    Cholecalciferol (VITAMIN D3) 50 MCG (2000 UT) capsule, Take by mouth., Disp: , Rfl:    Cholecalciferol 50 MCG (2000 UT) CAPS, Take 3 capsules by mouth in the morning., Disp: , Rfl:    citalopram (CELEXA) 20 MG tablet, Take 10 mg by mouth daily., Disp: , Rfl:    citalopram (CELEXA) 20 MG tablet, Take by mouth., Disp: , Rfl:    fluticasone (FLONASE) 50 MCG/ACT nasal spray, Place 2 sprays into the nose daily., Disp: , Rfl:    furosemide (LASIX) 20 MG tablet, TAKE 1 TABLET ONE TIME DAILY, Disp: , Rfl:    losartan (COZAAR) 50 MG tablet, Take 1 tablet by mouth daily., Disp: ,  Rfl:    magnesium gluconate 54mg /48ml syringe, Take by mouth., Disp: , Rfl:    multivitamin-iron-minerals-folic acid (CENTRUM) chewable tablet, Chew 1 tablet by mouth daily., Disp: , Rfl:    omeprazole (PRILOSEC) 40 MG capsule, Take by mouth., Disp: , Rfl:    pantoprazole (PROTONIX) 40 MG tablet, Take 40 mg by mouth daily., Disp: , Rfl:    Probiotic Product (PROBIOTIC DAILY PO), Take by mouth daily., Disp: , Rfl:    senna-docusate (SENOKOT-S) 8.6-50 MG tablet, Take 1 tablet by mouth 2 (two) times daily., Disp: , Rfl:    timolol (BETIMOL) 0.5 % ophthalmic solution, Place 1 drop into both eyes daily., Disp: , Rfl:    White Petrolatum-Mineral Oil (SYSTANE NIGHTTIME) OINT, Apply 1 application. to eye at bedtime., Disp: , Rfl:    ZINC CITRATE PO, Take 1 tablet by mouth 2 (two) times daily., Disp: , Rfl:    zinc gluconate 3.75 mg/mL SOLN, Take 1 tablet by mouth 2 (two) times daily., Disp: , Rfl:   Past Medical History: Past Medical History:  Diagnosis Date   Actinic keratosis    Arthritis    osteo - shoulders, fingers   Atrial fibrillation (HCC)    Basal cell carcinoma 01/05/2023   dorsum nose supratip, need treatment mohs or radiation  Basal cell carcinoma (BCC) 01/05/2023   right preauricular, EDC   Dizziness    thinks because of diuretic   Family history of adverse reaction to anesthesia    daughter -PONV   Gallstones 2016, 2017   GERD (gastroesophageal reflux disease)    Glaucoma    Heart murmur    history of   Hypertension    Melanoma (HCC) 04/20/2016   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 Excised: 05/19/2016, margins free.   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   Squamous cell carcinoma of skin    R nasal labial   Tricuspid valve disorder    leak    Tobacco Use: Social History   Tobacco Use  Smoking  Status Never  Smokeless Tobacco Never    Labs: Review Flowsheet       Latest Ref Rng & Units 06/17/2020 06/18/2020  Labs for ITP Cardiac and Pulmonary Rehab  Cholestrol 0 - 200 mg/dL - 161   LDL (calc) 0 - 99 mg/dL - 096   Direct LDL 0 - 99 mg/dL 045.4  -  HDL-C >09 mg/dL - 51   Trlycerides <811 mg/dL - 57   Hemoglobin B1Y 4.8 - 5.6 % 5.4  -    Details             Pulmonary Assessment Scores:  Pulmonary Assessment Scores     Row Name 11/02/22 1737         ADL UCSD   ADL Phase Entry     SOB Score total 67     Rest 2     Walk 3     Stairs 4     Bath 2     Dress 2     Shop 3       CAT Score   CAT Score 23       mMRC Score   mMRC Score 2              UCSD: Self-administered rating of dyspnea associated with activities of daily living (ADLs) 6-point scale (0 = "not at all" to 5 = "maximal or unable to do because of breathlessness")  Scoring Scores range from 0 to 120.  Minimally important difference is 5 units  CAT: CAT can identify the health impairment of COPD patients and is better correlated with disease progression.  CAT has a scoring range of zero to 40. The CAT score is classified into four groups of low (less than 10), medium (10 - 20), high (21-30) and very high (31-40) based on the impact level of disease on health status. A CAT score over 10 suggests significant symptoms.  A worsening CAT score could be explained by an exacerbation, poor medication adherence, poor inhaler technique, or progression of COPD or comorbid conditions.  CAT MCID is 2 points  mMRC: mMRC (Modified Medical Research Council) Dyspnea Scale is used to assess the degree of baseline functional disability in patients of respiratory disease due to dyspnea. No minimal important difference is established. A decrease in score of 1 point or greater is considered a positive change.   Pulmonary Function Assessment:  Pulmonary Function Assessment - 10/28/22 1350       Breath    Shortness of Breath Yes;Limiting activity  Exercise Target Goals: Exercise Program Goal: Individual exercise prescription set using results from initial 6 min walk test and THRR while considering  patient's activity barriers and safety.   Exercise Prescription Goal: Initial exercise prescription builds to 30-45 minutes a day of aerobic activity, 2-3 days per week.  Home exercise guidelines will be given to patient during program as part of exercise prescription that the participant will acknowledge.  Education: Aerobic Exercise: - Group verbal and visual presentation on the components of exercise prescription. Introduces F.I.T.T principle from ACSM for exercise prescriptions.  Reviews F.I.T.T. principles of aerobic exercise including progression. Written material given at graduation.   Education: Resistance Exercise: - Group verbal and visual presentation on the components of exercise prescription. Introduces F.I.T.T principle from ACSM for exercise prescriptions  Reviews F.I.T.T. principles of resistance exercise including progression. Written material given at graduation. Flowsheet Row Pulmonary Rehab from 12/25/2020 in [facility] Cardiac and Pulmonary Rehab  Date 11/06/20  Educator AS  Instruction Review Code 1- Verbalizes Understanding        Education: Exercise & Equipment Safety: - Individual verbal instruction and demonstration of equipment use and safety with use of the equipment. Flowsheet Row Pulmonary Rehab from 01/27/2023 in [facility] Cardiac and Pulmonary Rehab  Date 11/02/22  Educator [facility]  Instruction Review Code 1- Verbalizes Understanding       Education: Exercise Physiology & General Exercise Guidelines: - Group verbal and written instruction with models to review the exercise physiology of the cardiovascular system and associated critical values. Provides general exercise guidelines with specific guidelines to those with heart or lung disease.  Flowsheet Row  Pulmonary Rehab from 12/25/2020 in [facility] Cardiac and Pulmonary Rehab  Date 10/23/20  Educator AS  Instruction Review Code 1- Verbalizes Understanding       Education: Flexibility, Balance, Mind/Body Relaxation: - Group verbal and visual presentation with interactive activity on the components of exercise prescription. Introduces F.I.T.T principle from ACSM for exercise prescriptions. Reviews F.I.T.T. principles of flexibility and balance exercise training including progression. Also discusses the mind body connection.  Reviews various relaxation techniques to help reduce and manage stress (i.e. Deep breathing, progressive muscle relaxation, and visualization). Balance handout provided to take home. Written material given at graduation. Flowsheet Row Pulmonary Rehab from 01/27/2023 in [facility] Cardiac and Pulmonary Rehab  Date 12/30/22  Educator NT  Instruction Review Code 1- Verbalizes Understanding       Activity Barriers & Risk Stratification:  Activity Barriers & Cardiac Risk Stratification - 11/02/22 1728       Activity Barriers & Cardiac Risk Stratification   Activity Barriers Deconditioning;Muscular Weakness;Shortness of Breath;History of Falls;Balance Concerns;Joint Problems             6 Minute Walk:  6 Minute Walk     Row Name 11/02/22 1726         6 Minute Walk   Phase Initial     Distance 750 feet     Walk Time 6 minutes     # of Rest Breaks 0     MPH 1.42     METS 1.48     RPE 13     Perceived Dyspnea  2     VO2 Peak 5.16     Symptoms No     Resting HR 69 bpm     Resting BP 122/72     Resting Oxygen Saturation  96 %     Exercise Oxygen Saturation  during 6 min walk 85 %     Max  Ex. HR 104 bpm     Max Ex. BP 124/72     2 Minute Post BP 114/62       Interval HR   1 Minute HR 89     2 Minute HR 88     3 Minute HR 97     4 Minute HR 99     5 Minute HR 102     6 Minute HR 104     2 Minute Post HR 60     Interval Heart Rate? Yes       Interval Oxygen    Interval Oxygen? Yes     Baseline Oxygen Saturation % 96 %     1 Minute Oxygen Saturation % 92 %     1 Minute Liters of Oxygen 4 L     2 Minute Oxygen Saturation % 87 %     2 Minute Liters of Oxygen 4 L     3 Minute Oxygen Saturation % 87 %     3 Minute Liters of Oxygen 4 L     4 Minute Oxygen Saturation % 85 %     4 Minute Liters of Oxygen 4 L     5 Minute Oxygen Saturation % 85 %     5 Minute Liters of Oxygen 4 L     6 Minute Oxygen Saturation % 86 %     6 Minute Liters of Oxygen 4 L     2 Minute Post Oxygen Saturation % 91 %     2 Minute Post Liters of Oxygen 4 L             Oxygen Initial Assessment:  Oxygen Initial Assessment - 11/02/22 1736       Home Oxygen   Home Oxygen Device Home Concentrator;Portable Concentrator;E-Tanks    Sleep Oxygen Prescription Continuous    Liters per minute 3    Liters per minute 3    Home Resting Oxygen Prescription Continuous    Liters per minute 2    Compliance with Home Oxygen Use Yes      Initial 6 min Walk   Oxygen Used Continuous;Portable Concentrator    Liters per minute 4      Program Oxygen Prescription   Program Oxygen Prescription Continuous    Liters per minute 3      Intervention   Short Term Goals To learn and exhibit compliance with exercise, home and travel O2 prescription;To learn and understand importance of maintaining oxygen saturations>88%;To learn and demonstrate proper use of respiratory medications;To learn and understand importance of monitoring SPO2 with pulse oximeter and demonstrate accurate use of the pulse oximeter.;To learn and demonstrate proper pursed lip breathing techniques or other breathing techniques.     Long  Term Goals Verbalizes importance of monitoring SPO2 with pulse oximeter and return demonstration;Exhibits proper breathing techniques, such as pursed lip breathing or other method taught during program session;Demonstrates proper use of MDI's;Compliance with respiratory  medication;Maintenance of O2 saturations>88%;Exhibits compliance with exercise, home  and travel O2 prescription             Oxygen Re-Evaluation:  Oxygen Re-Evaluation     Row Name 11/04/22 1413 11/23/22 1558 12/10/22 1558 01/25/23 1602       Program Oxygen Prescription   Program Oxygen Prescription -- Continuous Continuous Continuous    Liters per minute -- 4 4 4       Home Oxygen   Home Oxygen Device -- Home Concentrator;Portable Concentrator;E-Tanks Home Concentrator;Portable Concentrator;E-Tanks Home  Concentrator;Portable Concentrator;E-Tanks    Sleep Oxygen Prescription -- Continuous Continuous Continuous    Liters per minute -- 4 4 4     Home Exercise Oxygen Prescription -- Continuous -- --    Liters per minute -- 4 4 4     Home Resting Oxygen Prescription -- Continuous Continuous Continuous    Liters per minute -- 2  on occasion will take off during rest 2 2    Compliance with Home Oxygen Use -- Yes Yes Yes      Goals/Expected Outcomes   Short Term Goals -- To learn and exhibit compliance with exercise, home and travel O2 prescription;To learn and understand importance of maintaining oxygen saturations>88%;To learn and demonstrate proper use of respiratory medications;To learn and understand importance of monitoring SPO2 with pulse oximeter and demonstrate accurate use of the pulse oximeter.;To learn and demonstrate proper pursed lip breathing techniques or other breathing techniques.  To learn and understand importance of maintaining oxygen saturations>88%;To learn and understand importance of monitoring SPO2 with pulse oximeter and demonstrate accurate use of the pulse oximeter. To learn and demonstrate proper pursed lip breathing techniques or other breathing techniques.     Long  Term Goals -- Verbalizes importance of monitoring SPO2 with pulse oximeter and return demonstration;Exhibits proper breathing techniques, such as pursed lip breathing or other method taught during  program session;Demonstrates proper use of MDI's;Compliance with respiratory medication;Maintenance of O2 saturations>88%;Exhibits compliance with exercise, home  and travel O2 prescription Maintenance of O2 saturations>88%;Verbalizes importance of monitoring SPO2 with pulse oximeter and return demonstration Exhibits proper breathing techniques, such as pursed lip breathing or other method taught during program session    Comments Reviewed PLB technique with pt.  Talked about how it works and it's importance in maintaining their exercise saturations. Shakura is doing well in rehab. She is compliant with her oxygen and will use 4L with acitvity and 3-4 L at night.   During rest she will occassionaly take it off completely and still be able to maintain her saturations.  She is good about using her PLB to maintain her saturations when she notices it dropping.  She does several breathing exercises as night to help her breathing.  She does note that she has been coughing more and her doctor did start her on Mucinex to help clear muscus and cough. She has a pulse oximeter to check her oxygen saturation at home. Informed and explained why it is important to have one. Reviewed that oxygen saturations should be 88 percent and above. Patient verbalizes understanding. Diaphragmatic and PLB breathing explained and performed with patient. Patient has a better understanding of how to do these exercises to help with breathing performance and relaxation. Patient performed breathing techniques adequately and to practice further at home.    Goals/Expected Outcomes Short: Become more profiecient at using PLB.   Long: Become independent at using PLB. Short: Continue to monitor saturations and cough Long; Continue to use PLB Short: monitor oxygen at home with exertion. Long: maintain oxygen saturations above 88 percent independently. Short: practice PLB and diaphragmatic breathing at home. Long: Use PLB and diaphragmatic breathing  independently post [name].             Oxygen Discharge (Final Oxygen Re-Evaluation):  Oxygen Re-Evaluation - 01/25/23 1602       Program Oxygen Prescription   Program Oxygen Prescription Continuous    Liters per minute 4      Home Oxygen   Home Oxygen Device Home Concentrator;Portable Concentrator;E-Tanks  Sleep Oxygen Prescription Continuous    Liters per minute 4    Liters per minute 4    Home Resting Oxygen Prescription Continuous    Liters per minute 2    Compliance with Home Oxygen Use Yes      Goals/Expected Outcomes   Short Term Goals To learn and demonstrate proper pursed lip breathing techniques or other breathing techniques.     Long  Term Goals Exhibits proper breathing techniques, such as pursed lip breathing or other method taught during program session    Comments Diaphragmatic and PLB breathing explained and performed with patient. Patient has a better understanding of how to do these exercises to help with breathing performance and relaxation. Patient performed breathing techniques adequately and to practice further at home.    Goals/Expected Outcomes Short: practice PLB and diaphragmatic breathing at home. Long: Use PLB and diaphragmatic breathing independently post [name].             Initial Exercise Prescription:  Initial Exercise Prescription - 11/02/22 1700       Date of Initial Exercise RX and Referring Provider   Date 11/02/22    Referring Provider Kloefkorn      Oxygen   Oxygen Continuous    Liters 4    Maintain Oxygen Saturation 88% or higher      Treadmill   MPH 1    Grade 0    Minutes 15    METs 1.8      REL-XR   Level 1    Speed 50    Minutes 15    METs 1.48      Biostep-RELP   Level 1    SPM 50    Minutes 15    METs 1.48      Track   Laps 10    Minutes 15    METs 1.54      Prescription Details   Frequency (times per week) 2    Duration Progress to 30 minutes of continuous aerobic without signs/symptoms  of physical distress      Intensity   THRR 40-80% of Max Heartrate 97-125    Ratings of Perceived Exertion 11-13    Perceived Dyspnea 0-4      Progression   Progression Continue to progress workloads to maintain intensity without signs/symptoms of physical distress.      Resistance Training   Training Prescription Yes    Weight 2    Reps 10-15             Perform Capillary Blood Glucose checks as needed.  Exercise Prescription Changes:   Exercise Prescription Changes     Row Name 11/02/22 1700 11/16/22 1400 11/23/22 1600 11/30/22 1300 12/15/22 1500     Response to Exercise   Blood Pressure (Admit) 122/72 130/70 -- 128/74 124/60   Blood Pressure (Exercise) 124/72 136/58 -- 120/68 118/64   Blood Pressure (Exit) 114/62 124/72 -- 130/76 118/60   Heart Rate (Admit) 69 bpm 67 bpm -- 68 bpm 69 bpm   Heart Rate (Exercise) 104 bpm 105 bpm -- 105 bpm 87 bpm   Heart Rate (Exit) 72 bpm 78 bpm -- 90 bpm 75 bpm   Oxygen Saturation (Admit) 96 % 98 % -- 98 % 96 %   Oxygen Saturation (Exercise) 85 % 88 % -- 90 % 88 %   Oxygen Saturation (Exit) 97 % 96 % -- 95 % 93 %   Rating of Perceived Exertion (Exercise) 13 13 -- 12 12  Perceived Dyspnea (Exercise) 2 1 -- 2 1   Symptoms none SOB -- SOB SOB   Comments 6 MWT results First three days of exercise -- -- --   Duration -- Progress to 30 minutes of  aerobic without signs/symptoms of physical distress -- Progress to 30 minutes of  aerobic without signs/symptoms of physical distress Progress to 30 minutes of  aerobic without signs/symptoms of physical distress   Intensity -- THRR unchanged -- THRR unchanged THRR unchanged     Progression   Progression -- Continue to progress workloads to maintain intensity without signs/symptoms of physical distress. -- Continue to progress workloads to maintain intensity without signs/symptoms of physical distress. Continue to progress workloads to maintain intensity without signs/symptoms of physical  distress.   Average METs -- 1.7 -- 1.5 2.2     Resistance Training   Training Prescription -- Yes -- Yes Yes   Weight -- 2 lb -- 2 lb 2 lb   Reps -- 10-15 -- 10-15 10-15     Interval Training   Interval Training -- No -- No No     Oxygen   Oxygen -- Continuous -- Continuous Continuous   Liters -- 4 -- 4 4     Treadmill   MPH -- 1.2 -- -- --   Grade -- 0 -- -- --   Minutes -- 15 -- -- --   METs -- 1.92 -- -- --     NuStep   Level -- 1 -- 1 4   Minutes -- 15 -- 30 30   METs -- 1.5 -- 1.6 2.3     T5 Nustep   Level -- -- -- 1 --   Minutes -- -- -- 15 --   METs -- -- -- 1.7 --     Biostep-RELP   Level -- 1 -- -- --   Minutes -- 15 -- -- --   METs -- 2 -- -- --     Home Exercise Plan   Plans to continue exercise at -- -- Home (comment)  walking Home (comment)  walking Home (comment)  walking   Frequency -- -- Add 2 additional days to program exercise sessions. Add 2 additional days to program exercise sessions. Add 2 additional days to program exercise sessions.   Initial Home Exercises Provided -- -- 11/23/22 11/23/22 11/23/22     Oxygen   Maintain Oxygen Saturation -- 88% or higher -- 88% or higher 88% or higher    Row Name 12/30/22 1500 01/13/23 0700 01/28/23 1000 02/09/23 1400 02/23/23 1400     Response to Exercise   Blood Pressure (Admit) 118/66 120/62 128/70 124/66 112/60   Blood Pressure (Exercise) 130/72 -- -- -- --   Blood Pressure (Exit) 122/64 130/82 124/80 138/70 122/70   Heart Rate (Admit) 69 bpm 72 bpm 70 bpm 60 bpm 70 bpm   Heart Rate (Exercise) 101 bpm 102 bpm 105 bpm 102 bpm 103 bpm   Heart Rate (Exit) 89 bpm 73 bpm 81 bpm 70 bpm 78 bpm   Oxygen Saturation (Admit) 94 % 95 % 98 % 94 % 95 %   Oxygen Saturation (Exercise) 88 % 88 % 88 % 89 % 89 %   Oxygen Saturation (Exit) 93 % 95 % 97 % 97 % 95 %   Rating of Perceived Exertion (Exercise) 16 14 13 14 13    Perceived Dyspnea (Exercise) 3 3 3 3 3    Symptoms SOB SOB SOB SOB SOB   Duration Progress to 30  minutes of  aerobic without signs/symptoms of physical distress Progress to 30 minutes of  aerobic without signs/symptoms of physical distress Progress to 30 minutes of  aerobic without signs/symptoms of physical distress Continue with 30 min of aerobic exercise without signs/symptoms of physical distress. Continue with 30 min of aerobic exercise without signs/symptoms of physical distress.   Intensity THRR unchanged THRR unchanged THRR unchanged THRR unchanged THRR unchanged     Progression   Progression Continue to progress workloads to maintain intensity without signs/symptoms of physical distress. Continue to progress workloads to maintain intensity without signs/symptoms of physical distress. Continue to progress workloads to maintain intensity without signs/symptoms of physical distress. Continue to progress workloads to maintain intensity without signs/symptoms of physical distress. Continue to progress workloads to maintain intensity without signs/symptoms of physical distress.   Average METs 1.96 2.1 2.13 2.41 1.8     Resistance Training   Training Prescription Yes Yes Yes Yes Yes   Weight 2 lb 2 lb 2 lb 2 lb 2 lb   Reps 10-15 10-15 10-15 10-15 10-15     Interval Training   Interval Training No No No No No     Oxygen   Oxygen Continuous Continuous Continuous Continuous Continuous   Liters 4 4 4 4 4      Treadmill   MPH -- 1.2 1.5 -- --   Grade -- 0 0 -- --   Minutes -- 15 15 -- --   METs -- 1.92 2.15 -- --     NuStep   Level 4 5 -- 4 5   Minutes 15 15 -- 15 15   METs 1.7 2 -- 1.8 2     REL-XR   Level 1 1 2 2  --   Minutes 15 15 15 15  --   METs 3.9 3.4 3.3 3.9 --     T5 Nustep   Level 1 1 2 2 2    Minutes 15 15 15 15 15    METs 3.9 2 1.7 1.8 2     Track   Laps 12 -- 20 15 11    Minutes 15 -- 15 15 15    METs 1.65 -- 2.09 1.82 1.6     Home Exercise Plan   Plans to continue exercise at Home (comment)  walking Home (comment)  walking Home (comment)  walking Home (comment)   walking Home (comment)  walking   Frequency Add 2 additional days to program exercise sessions. Add 2 additional days to program exercise sessions. Add 2 additional days to program exercise sessions. Add 2 additional days to program exercise sessions. Add 2 additional days to program exercise sessions.   Initial Home Exercises Provided 11/23/22 11/23/22 11/23/22 11/23/22 11/23/22     Oxygen   Maintain Oxygen Saturation 88% or higher 88% or higher 88% or higher 88% or higher 88% or higher    Row Name 03/08/23 1400             Response to Exercise   Blood Pressure (Admit) 128/70       Blood Pressure (Exercise) 164/80       Blood Pressure (Exit) 112/66       Heart Rate (Admit) 82 bpm       Heart Rate (Exercise) 107 bpm       Heart Rate (Exit) 80 bpm       Oxygen Saturation (Admit) 95 %       Oxygen Saturation (Exercise) 88 %       Oxygen Saturation (Exit) 94 %  Rating of Perceived Exertion (Exercise) 14       Perceived Dyspnea (Exercise) 3       Symptoms SOB       Duration Continue with 30 min of aerobic exercise without signs/symptoms of physical distress.       Intensity THRR unchanged         Progression   Progression Continue to progress workloads to maintain intensity without signs/symptoms of physical distress.       Average METs 2.03         Resistance Training   Training Prescription Yes       Weight 2 lb       Reps 10-15         Interval Training   Interval Training No         Oxygen   Oxygen Continuous       Liters 4         NuStep   Level 5       Minutes 15       METs 2.2         REL-XR   Level 3       Minutes 15       METs 2.8         T5 Nustep   Level 4       Minutes 15       METs 2         Track   Laps 11       Minutes 15       METs 1.6         Home Exercise Plan   Plans to continue exercise at Home (comment)  walking       Frequency Add 2 additional days to program exercise sessions.       Initial Home Exercises Provided 11/23/22          Oxygen   Maintain Oxygen Saturation 88% or higher                Exercise Comments:   Exercise Comments     Row Name 11/04/22 1411           Exercise Comments First full day of exercise!  Patient was oriented to gym and equipment including functions, settings, policies, and procedures.  Patient's individual exercise prescription and treatment plan were reviewed.  All starting workloads were established based on the results of the 6 minute walk test done at initial orientation visit.  The plan for exercise progression was also introduced and progression will be customized based on patient's performance and goals.                Exercise Goals and Review:   Exercise Goals     Row Name 11/02/22 1731             Exercise Goals   Increase Physical Activity Yes       Intervention Provide advice, education, support and counseling about physical activity/exercise needs.;Develop an individualized exercise prescription for aerobic and resistive training based on initial evaluation findings, risk stratification, comorbidities and participant's personal goals.       Expected Outcomes Short Term: Attend rehab on a regular basis to increase amount of physical activity.;Long Term: Add in home exercise to make exercise part of routine and to increase amount of physical activity.;Long Term: Exercising regularly at least 3-5 days a week.       Increase Strength and Stamina Yes  Intervention Provide advice, education, support and counseling about physical activity/exercise needs.;Develop an individualized exercise prescription for aerobic and resistive training based on initial evaluation findings, risk stratification, comorbidities and participant's personal goals.       Expected Outcomes Short Term: Increase workloads from initial exercise prescription for resistance, speed, and METs.;Short Term: Perform resistance training exercises routinely during rehab and add in resistance  training at home;Long Term: Improve cardiorespiratory fitness, muscular endurance and strength as measured by increased METs and functional capacity ( )       Able to understand and use rate of perceived exertion (RPE) scale Yes       Intervention Provide education and explanation on how to use RPE scale       Expected Outcomes Short Term: Able to use RPE daily in rehab to express subjective intensity level;Long Term:  Able to use RPE to guide intensity level when exercising independently       Able to understand and use Dyspnea scale Yes       Intervention Provide education and explanation on how to use Dyspnea scale       Expected Outcomes Short Term: Able to use Dyspnea scale daily in rehab to express subjective sense of shortness of breath during exertion;Long Term: Able to use Dyspnea scale to guide intensity level when exercising independently       Knowledge and understanding of Target Heart Rate Range (THRR) Yes       Intervention Provide education and explanation of THRR including how the numbers were predicted and where they are located for reference       Expected Outcomes Short Term: Able to state/look up THRR;Long Term: Able to use THRR to govern intensity when exercising independently;Short Term: Able to use daily as guideline for intensity in rehab       Able to check pulse independently Yes       Intervention Provide education and demonstration on how to check pulse in carotid and radial arteries.;Review the importance of being able to check your own pulse for safety during independent exercise       Expected Outcomes Short Term: Able to explain why pulse checking is important during independent exercise       Understanding of Exercise Prescription Yes       Intervention Provide education, explanation, and written materials on patient's individual exercise prescription       Expected Outcomes Short Term: Able to explain program exercise prescription;Long Term: Able to explain home  exercise prescription to exercise independently                Exercise Goals Re-Evaluation :  Exercise Goals Re-Evaluation     Row Name 11/04/22 1411 11/16/22 1426 11/23/22 1545 11/30/22 1401 12/15/22 1525     Exercise Goal Re-Evaluation   Exercise Goals Review Increase Physical Activity;Able to understand and use rate of perceived exertion (RPE) scale;Knowledge and understanding of Target Heart Rate Range (THRR);Understanding of Exercise Prescription;Increase Strength and Stamina;Able to understand and use Dyspnea scale;Able to check pulse independently Increase Physical Activity;Increase Strength and Stamina;Understanding of Exercise Prescription Increase Physical Activity;Increase Strength and Stamina;Understanding of Exercise Prescription Increase Physical Activity;Increase Strength and Stamina;Understanding of Exercise Prescription Increase Physical Activity;Increase Strength and Stamina;Understanding of Exercise Prescription   Comments Reviewed RPE scale, THR and program prescription with pt today.  Pt voiced understanding and was given a copy of goals to take home. Malak is off to a good start in the program. She has done well with the T4  nustep and biostep at level 1. She also was able to walk up to 1.2 mph on the treadmill with no incline. She had an overall average MET level of 1.7 METs during her first three sessions of rehab. We will continue to monitor her progress in the program. Gretna is doing well in rehab.  She was out on vacation for two weeks.  She did walk some with her cane and her rollator.  She has noticed that her saturations will drop with walking so she will need to take a rest break to do her PLB to help hre recover. She was grateful to be able ot get away.  She also did her PT exercises and strength work while away.  eviewed home exercise with pt today.  Pt plans to walk at home for exercise.  Reviewed THR, pulse, RPE, sign and symptoms, pulse oximetery and when to call 911  or MD.  Also discussed weather considerations and indoor options.  Pt voiced understanding. Also reviewed goals with patient.  Service time [phone] Gayane continues to do well in rehab. She remains at level 1 on the T4 and T5 Nustep, we will encourage patient to increase her workload on the seated machines. Her RPEs are staying appropriate. We will continue to monitor. Rylynne is doing well in rehab since returning after having a fall. During her one session in rehab since the last review she was able to work on the T4 nustep for 30 minutes. She was also able to improve her workload up to level 4 on the T4. She has continued to do well with 2 lb hand weights for resistance training as well. We will continue to monitor her progress in the program.   Expected Outcomes Short: Use RPE daily to regulate intensity.  Long: Follow program prescription in THR. Short: Continue to follow current exercise prescription. Long: Continue to improve strength and stamina. Shrot: Back to routine exercise in rehab Long; Conitnue to improve stamina Short: Increase to level 2 on the T4 Nustep Long: Continue to increase overall MET level and stamina Short: Return to regular attendance in rehab. Long: Continue to increase overall MET level and stamina.    Row Name 12/30/22 1607 01/13/23 0755 01/28/23 1027 02/09/23 1445 02/23/23 1409     Exercise Goal Re-Evaluation   Exercise Goals Review Increase Physical Activity;Increase Strength and Stamina;Understanding of Exercise Prescription Increase Physical Activity;Increase Strength and Stamina;Understanding of Exercise Prescription Increase Physical Activity;Increase Strength and Stamina;Understanding of Exercise Prescription Increase Physical Activity;Increase Strength and Stamina;Understanding of Exercise Prescription Increase Physical Activity;Increase Strength and Stamina;Understanding of Exercise Prescription   Comments Torian is doing well in rehab. She has kept her workloads consistent  on all seated machines. She was able to increase her number of laps on the track from 10 to 12 laps. We will continue to monitor her progress in the program. Fuller Song is doing well in rehab. She recently improved to level 5 on the T4 nustep and began using the treadmill again at a speed of 1.2 mph with no incline. Her O2 saturations have also continued to stay above 88% during exercise. We will continue to monitor her progress in the program. Shakonda continues to do well in rehab. She recently improved to level 2 on the T5 and XR. She also increased her treadmill workload to a speed of 1.5 mph with no incline. She increased her laps on the track to 20 laps. We will continue to monitor her progress in the program. Keola is doing  well in rehab. Her laps on the track were down from 20 to 15 laps since the last review. However, her workloads on the T4 nustep, T5 nustep, and XR have stayed consistent. We will continue to monitor her progress in the program. Sirenia continues to do well in rehab. She recently increased her T4 level from 3 to 5. Her track laps have decreased again to 11 from 15, we will continue to encourage her return to former levels. We will continue to monitor her progress in the program.   Expected Outcomes Short: Continue to push for more laps on track. Long: Continue to improve strength and stamina. Short: Continue to progressively increase treadmill speed. Long: Continue to improve strength and stamina. Short: Continue to progressively increase treadmill workload. Long: Continue exercise to improve strength and stamina. Short: Increase laps on the track to previous workload. Long: Continue exercise to improve strength and stamina. Short: Increase laps on the track to previous workload. Long: Continue exercise to improve strength and stamina.    Row Name 03/08/23 1414             Exercise Goal Re-Evaluation   Exercise Goals Review Increase Physical Activity;Increase Strength and Stamina;Understanding  of Exercise Prescription       Comments Lania continues to do well in rehab. She increased her level on the T5 Nustep from 2 to 4. She also increased her level on the REXR from 2 to 3. We will continue to monitor her progress in the program.       Expected Outcomes Short: Continue to follow current exercise prescription, and progressively increase workloads. Long: Continue exercise to improve strength and stamina.                Discharge Exercise Prescription (Final Exercise Prescription Changes):  Exercise Prescription Changes - 03/08/23 1400       Response to Exercise   Blood Pressure (Admit) 128/70    Blood Pressure (Exercise) 164/80    Blood Pressure (Exit) 112/66    Heart Rate (Admit) 82 bpm    Heart Rate (Exercise) 107 bpm    Heart Rate (Exit) 80 bpm    Oxygen Saturation (Admit) 95 %    Oxygen Saturation (Exercise) 88 %    Oxygen Saturation (Exit) 94 %    Rating of Perceived Exertion (Exercise) 14    Perceived Dyspnea (Exercise) 3    Symptoms SOB    Duration Continue with 30 min of aerobic exercise without signs/symptoms of physical distress.    Intensity THRR unchanged      Progression   Progression Continue to progress workloads to maintain intensity without signs/symptoms of physical distress.    Average METs 2.03      Resistance Training   Training Prescription Yes    Weight 2 lb    Reps 10-15      Interval Training   Interval Training No      Oxygen   Oxygen Continuous    Liters 4      NuStep   Level 5    Minutes 15    METs 2.2      REL-XR   Level 3    Minutes 15    METs 2.8      T5 Nustep   Level 4    Minutes 15    METs 2      Track   Laps 11    Minutes 15    METs 1.6      Home Exercise Plan  Plans to continue exercise at Home (comment)   walking   Frequency Add 2 additional days to program exercise sessions.    Initial Home Exercises Provided 11/23/22      Oxygen   Maintain Oxygen Saturation 88% or higher              Nutrition:  Target Goals: Understanding of nutrition guidelines, daily intake of sodium 1500mg , cholesterol 200mg , calories 30% from fat and 7% or less from saturated fats, daily to have 5 or more servings of fruits and vegetables.  Education: All About Nutrition: -Group instruction provided by verbal, written material, interactive activities, discussions, models, and posters to present general guidelines for heart healthy nutrition including fat, fiber, MyPlate, the role of sodium in heart healthy nutrition, utilization of the nutrition label, and utilization of this knowledge for meal planning. Follow up email sent as well. Written material given at graduation. Flowsheet Row Pulmonary Rehab from 12/25/2020 in [facility] Cardiac and Pulmonary Rehab  Date 11/20/20  Educator [facility]  Instruction Review Code 1- Verbalizes Understanding       Biometrics:  Pre Biometrics - 11/02/22 1732       Pre Biometrics   Height 5\' 2"  (1.575 m)    Weight 129 lb (58.5 kg)    Waist Circumference 33 inches    Hip Circumference 39 inches    Waist to Hip Ratio 0.85 %    BMI (Calculated) 23.59    Single Leg Stand 2.78 seconds              Nutrition Therapy Plan and Nutrition Goals:  Nutrition Therapy & Goals - 01/27/23 1609       Nutrition Therapy   Diet Mediterranean, low Na    Protein (specify units) 90    Fiber 25 grams    Whole Grain Foods 3 servings    Saturated Fats 15 max. grams    Fruits and Vegetables 5 servings/day    Sodium 2 grams      Personal Nutrition Goals   Nutrition Goal Eat smaller more frequent meals    Personal Goal #2 Pair carbs with protein/fat    Comments Patient drinking mostly water. Reviewed 24hr food recall. She struggles to get larger meals in, her husband wants to make sure she gets her nutrition in and makes her large meals with good foods. Talked about her food choices are good, but that eating smaller more frequent meals will help her get more nutrition in  without feeling bloated or overwhelmed with the quantity of food. Educated on healthy fats, their nutrient and calorie density. Encouraged her to break up her large breakfast into smaller meals/snacks with complex carbs paired with quality protein or healthy fat. Reviewed mediterranean diet handout, built out several meals and snacks with foods she likes and will eat. 24-hours Recall:  B: scrambled eggs or fried egg on toast, fruits like berries/cherries, crunchy peanut butter toast, drinks premier protein shake  L: half tomato sandwich  D: pinto beans, cabbage, left over chicken pie, dinner roll      Intervention Plan   Intervention Nutrition handout(s) given to patient.;Prescribe, educate and counsel regarding individualized specific dietary modifications aiming towards targeted core components such as weight, hypertension, lipid management, diabetes, heart failure and other comorbidities.    Expected Outcomes Short Term Goal: Understand basic principles of dietary content, such as calories, fat, sodium, cholesterol and nutrients.;Short Term Goal: A plan has been developed with personal nutrition goals set during dietitian appointment.;Long Term Goal: Adherence to  prescribed nutrition plan.             Nutrition Assessments:  MEDIFICTS Score Key: ?70 Need to make dietary changes  40-70 Heart Healthy Diet ? 40 Therapeutic Level Cholesterol Diet  Flowsheet Row Pulmonary Rehab from 11/02/2022 in [facility] Cardiac and Pulmonary Rehab  Picture Your Plate Total Score on Admission 81      Picture Your Plate Scores: <16 Unhealthy dietary pattern with much room for improvement. 41-50 Dietary pattern unlikely to meet recommendations for good health and room for improvement. 51-60 More healthful dietary pattern, with some room for improvement.  >60 Healthy dietary pattern, although there may be some specific behaviors that could be improved.   Nutrition Goals Re-Evaluation:  Nutrition Goals  Re-Evaluation     Row Name 11/23/22 1551 12/10/22 1603 01/25/23 1606         Goals   Current Weight -- 130 lb (59 kg) --     Nutrition Goal Schedule appt with dietitian (husband diabetic) Eat smaller meals Meet with dietitian     Comment Daisey would like to set up an appointment to meet with dietitian.  She would like to work on her diet and her husbands as well.  She is working on watching sodium and getting in a good variety. Patient was informed on why it is important to maintain a balanced diet when dealing with Respiratory issues. Explained that it takes a lot of energy to breath and when they are short of breath often they will need to have a good diet to help keep up with the calories they are expending for breathing. Lucymae would like to meet with RD.     Expected Outcome Short: Set up dietitian appt Long: continue to work diet Short: Choose and plan snacks accordingly to patients caloric intake to improve breathing. Long: Maintain a diet independently that meets their caloric intake to aid in daily shortness of breath. Short: meet with RD. Long: adhere to a diet to pertain to her.              Nutrition Goals Discharge (Final Nutrition Goals Re-Evaluation):  Nutrition Goals Re-Evaluation - 01/25/23 1606       Goals   Nutrition Goal Meet with dietitian    Comment Abish would like to meet with RD.    Expected Outcome Short: meet with RD. Long: adhere to a diet to pertain to her.             Psychosocial: Target Goals: Acknowledge presence or absence of significant depression and/or stress, maximize coping skills, provide positive support system. Participant is able to verbalize types and ability to use techniques and skills needed for reducing stress and depression.   Education: Stress, Anxiety, and Depression - Group verbal and visual presentation to define topics covered.  Reviews how body is impacted by stress, anxiety, and depression.  Also discusses healthy ways to reduce  stress and to treat/manage anxiety and depression.  Written material given at graduation. Flowsheet Row Pulmonary Rehab from 12/25/2020 in [facility] Cardiac and Pulmonary Rehab  Date 10/16/20  Educator [facility]  Instruction Review Code 1- [employer] Understanding       Education: Sleep Hygiene -Provides group verbal and written instruction about how sleep can affect your health.  Define sleep hygiene, discuss sleep cycles and impact of sleep habits. Review good sleep hygiene tips.  Flowsheet Row Cardiac Rehab from 05/30/2018 in [facility] Cardiac and Pulmonary Rehab  Date 03/16/18  Educator [facility]  Instruction Review Code 1-  Verbalizes Understanding       Initial Review & Psychosocial Screening:  Initial Psych Review & Screening - 10/28/22 1352       Initial Review   Current issues with Current Anxiety/Panic      Family Dynamics   Good Support System? Yes    Comments Sararose can look to her husband and her two daughters for support. Her daughters call her almost everyday. She also has alot of faith that gets her through alot.      Barriers   Psychosocial barriers to participate in program The patient should benefit from training in stress management and relaxation.      Screening Interventions   Interventions To provide support and resources with identified psychosocial needs;Encouraged to exercise;Provide feedback about the scores to participant    Expected Outcomes Short Term goal: Utilizing psychosocial counselor, staff and physician to assist with identification of specific Stressors or current issues interfering with healing process. Setting desired goal for each stressor or current issue identified.;Long Term Goal: Stressors or current issues are controlled or eliminated.;Short Term goal: Identification and review with participant of any Quality of Life or Depression concerns found by scoring the questionnaire.;Long Term goal: The participant improves quality of Life and PHQ9 Scores as seen by post  scores and/or verbalization of changes             Quality of Life Scores:  Scores of 19 and below usually indicate a poorer quality of life in these areas.  A difference of  2-3 points is a clinically meaningful difference.  A difference of 2-3 points in the total score of the Quality of Life Index has been associated with significant improvement in overall quality of life, self-image, physical symptoms, and general health in studies assessing change in quality of life.  PHQ-9: Review Flowsheet  More data may exist      11/02/2022 01/02/2021 09/02/2020 05/30/2018 03/08/2018  Depression screen PHQ 2/9  Decreased Interest 0 0 0 0 1  Down, Depressed, Hopeless 0 0 0 0 0  PHQ - 2 Score 0 0 0 0 1  Altered sleeping 1 1 - - 0  Tired, decreased energy 1 1 1 1 1   Change in appetite - 0 1 0 0  Feeling bad or failure about yourself  0 0 0 0 0  Trouble concentrating 0 0 0 0 0  Moving slowly or fidgety/restless 0 0 0 0 0  Suicidal thoughts 0 0 0 0 0  PHQ-9 Score 2 2 - - 2  Difficult doing work/chores - Not difficult at all Not difficult at all - Not difficult at all    Details           Interpretation of Total Score  Total Score Depression Severity:  1-4 = Minimal depression, 5-9 = Mild depression, 10-14 = Moderate depression, 15-19 = Moderately severe depression, 20-27 = Severe depression   Psychosocial Evaluation and Intervention:  Psychosocial Evaluation - 10/28/22 1353       Psychosocial Evaluation & Interventions   Interventions Encouraged to exercise with the program and follow exercise prescription;Relaxation education;Stress management education    Comments Mykaila can look to her husband and her two daughters for support. Her daughters call her almost everyday. She also has alot of faith that gets her through alot.    Expected Outcomes Short: Start [name] to help with mood. Long: Maintain a healthy mental state.    Continue Psychosocial Services  Follow up required by staff  Psychosocial Re-Evaluation:  Psychosocial Re-Evaluation     Row Name 11/23/22 1548 12/10/22 1605           Psychosocial Re-Evaluation   Current issues with Current Stress Concerns None Identified      Comments Najay returned today from a two week vacation.  She got to go to [facility] for vacation.  She enjoyed going away as this was the first time she was able to get away since first getting sick.  She enjoyed getting out and seeing some of the sights and going through gardens as well. She continues to sleep well and feels good overall.  Her biggest frustration is not beinig able to breathe as well with acitvity.  She also admits to forgetting to breathe on occassion when doing things. Patient reports no issues with their current mental states, sleep, stress, depression or anxiety. Will follow up with patient in a few weeks for any changes.      Expected Outcomes short: COntinue to exercise for mood boost Long: Conitnue to stay positive Short: Continue to exercise regularly to support mental health and notify staff of any changes. Long: maintain mental health and well being through teaching of rehab or prescribed medications independently.      Interventions Encouraged to attend Pulmonary Rehabilitation for the exercise Encouraged to attend Pulmonary Rehabilitation for the exercise      Continue Psychosocial Services  -- Follow up required by staff               Psychosocial Discharge (Final Psychosocial Re-Evaluation):  Psychosocial Re-Evaluation - 12/10/22 1605       Psychosocial Re-Evaluation   Current issues with None Identified    Comments Patient reports no issues with their current mental states, sleep, stress, depression or anxiety. Will follow up with patient in a few weeks for any changes.    Expected Outcomes Short: Continue to exercise regularly to support mental health and notify staff of any changes. Long: maintain mental health and well being  through teaching of rehab or prescribed medications independently.    Interventions Encouraged to attend Pulmonary Rehabilitation for the exercise    Continue Psychosocial Services  Follow up required by staff             Education: Education Goals: Education classes will be provided on a weekly basis, covering required topics. Participant will state understanding/return demonstration of topics presented.  Learning Barriers/Preferences:  Learning Barriers/Preferences - 10/28/22 1351       Learning Barriers/Preferences   Learning Barriers None    Learning Preferences None             General Pulmonary Education Topics:  Infection Prevention: - Provides verbal and written material to individual with discussion of infection control including proper hand washing and proper equipment cleaning during exercise session. Flowsheet Row Pulmonary Rehab from 01/27/2023 in [facility] Cardiac and Pulmonary Rehab  Date 11/02/22  Educator [facility]  Instruction Review Code 1- Verbalizes Understanding       Falls Prevention: - Provides verbal and written material to individual with discussion of falls prevention and safety. Flowsheet Row Pulmonary Rehab from 01/27/2023 in [facility] Cardiac and Pulmonary Rehab  Date 11/02/22  Educator [facility]  Instruction Review Code 1- Verbalizes Understanding       Chronic Lung Disease Review: - Group verbal instruction with posters, models, PowerPoint presentations and videos,  to review new updates, new respiratory medications, new advancements in procedures and treatments. Providing information on websites and "800" numbers for  continued self-education. Includes information about supplement oxygen, available portable oxygen systems, continuous and intermittent flow rates, oxygen safety, concentrators, and Medicare reimbursement for oxygen. Explanation of Pulmonary Drugs, including class, frequency, complications, importance of spacers, rinsing mouth after steroid MDI's,  and proper cleaning methods for nebulizers. Review of basic lung anatomy and physiology related to function, structure, and complications of lung disease. Review of risk factors. Discussion about methods for diagnosing sleep apnea and types of masks and machines for OSA. Includes a review of the use of types of environmental controls: home humidity, furnaces, filters, dust mite/pet prevention, HEPA vacuums. Discussion about weather changes, air quality and the benefits of nasal washing. Instruction on Warning signs, infection symptoms, calling MD promptly, preventive modes, and value of vaccinations. Review of effective airway clearance, coughing and/or vibration techniques. Emphasizing that all should Create an Action Plan. Written material given at graduation. Flowsheet Row Pulmonary Rehab from 01/27/2023 in [facility] Cardiac and Pulmonary Rehab  Education need identified 11/02/22  Date 11/25/22  Educator [facility]  Instruction Review Code 1- Verbalizes Understanding       AED/CPR: - Group verbal and written instruction with the use of models to demonstrate the basic use of the AED with the basic ABC's of resuscitation.    Anatomy and Cardiac Procedures: - Group verbal and visual presentation and models provide information about basic cardiac anatomy and function. Reviews the testing methods done to diagnose heart disease and the outcomes of the test results. Describes the treatment choices: Medical Management, Angioplasty, or Coronary Bypass Surgery for treating various heart conditions including Myocardial Infarction, Angina, Valve Disease, and Cardiac Arrhythmias.  Written material given at graduation. Flowsheet Row Pulmonary Rehab from 12/25/2020 in [facility] Cardiac and Pulmonary Rehab  Date 11/06/20  Educator [facility]  Instruction Review Code 1- Verbalizes Understanding       Medication Safety: - Group verbal and visual instruction to review commonly prescribed medications for heart and lung disease.  Reviews the medication, class of the drug, and side effects. Includes the steps to properly store meds and maintain the prescription regimen.  Written material given at graduation. Flowsheet Row Pulmonary Rehab from 01/27/2023 in [facility] Cardiac and Pulmonary Rehab  Date 01/20/23  Educator [facility]  Instruction Review Code 1- Verbalizes Understanding       Other: -Provides group and verbal instruction on various topics (see comments)   Knowledge Questionnaire Score:  Knowledge Questionnaire Score - 11/02/22 1733       Knowledge Questionnaire Score   Pre Score 17/18              Core Components/Risk Factors/Patient Goals at Admission:  Personal Goals and Risk Factors at Admission - 11/02/22 1735       Core Components/Risk Factors/Patient Goals on Admission    Weight Management Yes;Weight Maintenance    Intervention Weight Management: Develop a combined nutrition and exercise program designed to reach desired caloric intake, while maintaining appropriate intake of nutrient and fiber, sodium and fats, and appropriate energy expenditure required for the weight goal.;Weight Management: Provide education and appropriate resources to help participant work on and attain dietary goals.;Weight Management/Obesity: Establish reasonable short term and long term weight goals.    Admit Weight 129 lb (58.5 kg)    Goal Weight: Short Term 129 lb (58.5 kg)    Goal Weight: Long Term 129 lb (58.5 kg)    Expected Outcomes Short Term: Continue to assess and modify interventions until short term weight is achieved;Long Term: Adherence to nutrition and physical activity/exercise program  aimed toward attainment of established weight goal;Understanding recommendations for meals to include 15-35% energy as protein, 25-35% energy from fat, 35-60% energy from carbohydrates, less than 200mg  of dietary cholesterol, 20-35 gm of total fiber daily;Understanding of distribution of calorie intake throughout the day with the  consumption of 4-5 meals/snacks;Weight Maintenance: Understanding of the daily nutrition guidelines, which includes 25-35% calories from fat, 7% or less cal from saturated fats, less than 200mg  cholesterol, less than 1.5gm of sodium, & 5 or more servings of fruits and vegetables daily    Improve shortness of breath with ADL's Yes    Intervention Provide education, individualized exercise plan and daily activity instruction to help decrease symptoms of SOB with activities of daily living.    Expected Outcomes Short Term: Improve cardiorespiratory fitness to achieve a reduction of symptoms when performing ADLs;Long Term: Be able to perform more ADLs without symptoms or delay the onset of symptoms    Heart Failure Yes    Intervention Provide a combined exercise and nutrition program that is supplemented with education, support and counseling about heart failure. Directed toward relieving symptoms such as shortness of breath, decreased exercise tolerance, and extremity edema.    Expected Outcomes Improve functional capacity of life;Short term: Attendance in program 2-3 days a week with increased exercise capacity. Reported lower sodium intake. Reported increased fruit and vegetable intake. Reports medication compliance.;Short term: Daily weights obtained and reported for increase. Utilizing diuretic protocols set by physician.;Long term: Adoption of self-care skills and reduction of barriers for early signs and symptoms recognition and intervention leading to self-care maintenance.    Hypertension Yes    Intervention Provide education on lifestyle modifcations including regular physical activity/exercise, weight management, moderate sodium restriction and increased consumption of fresh fruit, vegetables, and low fat dairy, alcohol moderation, and smoking cessation.;Monitor prescription use compliance.    Expected Outcomes Short Term: Continued assessment and intervention until BP is < 140/28mm HG in  hypertensive participants. < 130/57mm HG in hypertensive participants with diabetes, heart failure or chronic kidney disease.;Long Term: Maintenance of blood pressure at goal levels.    Lipids Yes    Intervention Provide education and support for participant on nutrition & aerobic/resistive exercise along with prescribed medications to achieve LDL 70mg , HDL >40mg .    Expected Outcomes Short Term: Participant states understanding of desired cholesterol values and is compliant with medications prescribed. Participant is following exercise prescription and nutrition guidelines.;Long Term: Cholesterol controlled with medications as prescribed, with individualized exercise RX and with personalized nutrition plan. Value goals: LDL < 70mg , HDL > 40 mg.             Education:Diabetes - Individual verbal and written instruction to review signs/symptoms of diabetes, desired ranges of glucose level fasting, after meals and with exercise. Acknowledge that pre and post exercise glucose checks will be done for 3 sessions at entry of program.   Know Your Numbers and Heart Failure: - Group verbal and visual instruction to discuss disease risk factors for cardiac and pulmonary disease and treatment options.  Reviews associated critical values for Overweight/Obesity, Hypertension, Cholesterol, and Diabetes.  Discusses basics of heart failure: signs/symptoms and treatments.  Introduces Heart Failure Zone chart for action plan for heart failure.  Written material given at graduation. Flowsheet Row Pulmonary Rehab from 01/27/2023 in [facility] Cardiac and Pulmonary Rehab  Date 01/27/23  Educator MS  Instruction Review Code 1- Verbalizes Understanding       Core Components/Risk Factors/Patient Goals Review:   Goals and Risk Factor Review  Row Name 11/23/22 1554 12/10/22 1602           Core Components/Risk Factors/Patient Goals Review   Personal Goals Review Weight Management/Obesity;Improve shortness of  breath with ADL's;Increase knowledge of respiratory medications and ability to use respiratory devices properly.;Hypertension;Heart Failure Improve shortness of breath with ADL's      Review Jessye is doing well in rehab.  She is up a little to 132 lb from being at beach last two weeks.  She would like to get it back down to her 129 lb that she is normally at.  She is working on her breathing and does get short of breath with activity and her saturations will drop, especially when she forgets to breathe. She is doing well in her meds and uses her inhalers.  She will also do her breathing exercises at night to help.  She checks her pressures at home and they have been doing well. She does not have any heart failure symptoms currently and feels pretty good overall. Spoke to patient about their shortness of breath and what they can do to improve. Patient has been informed of breathing techniques when starting the program. Patient is informed to tell staff if they have had any med changes and that certain meds they are taking or not taking can be causing shortness of breath.      Expected Outcomes short: Continue to work on getting vacation weight back down Long: conitnue to work on breathing Short: Attend [name] regularly to improve shortness of breath with ADL's. Long: maintain independence with ADL's               Core Components/Risk Factors/Patient Goals at Discharge (Final Review):   Goals and Risk Factor Review - 12/10/22 1602       Core Components/Risk Factors/Patient Goals Review   Personal Goals Review Improve shortness of breath with ADL's    Review Spoke to patient about their shortness of breath and what they can do to improve. Patient has been informed of breathing techniques when starting the program. Patient is informed to tell staff if they have had any med changes and that certain meds they are taking or not taking can be causing shortness of breath.    Expected Outcomes Short: Attend  [name] regularly to improve shortness of breath with ADL's. Long: maintain independence with ADL's             ITP Comments:  ITP Comments     Row Name 10/28/22 1348 11/02/22 1725 11/04/22 1411 11/25/22 0829 12/23/22 0918   ITP Comments Virtual Visit completed. Patient informed on EP and RD appointment and 6 Minute walk test. Patient also informed of patient health questionnaires on My Chart. Patient Verbalizes understanding. Visit diagnosis can be found in [facility] 09/10/2022. Completed and gym orientation. Initial ITP created and sent for review to Dr. Jinny Sanders, Medical Director. First full day of exercise!  Patient was oriented to gym and equipment including functions, settings, policies, and procedures.  Patient's individual exercise prescription and treatment plan were reviewed.  All starting workloads were established based on the results of the 6 minute walk test done at initial orientation visit.  The plan for exercise progression was also introduced and progression will be customized based on patient's performance and goals. 30 Day review completed. Medical Director ITP review done, changes made as directed, and signed approval by Medical Director.   new to program 30 Day review completed. Medical Director ITP review done, changes made as  directed, and signed approval by Medical Director.    Row Name 01/19/23 1418 02/17/23 1135 03/17/23 1004       ITP Comments 30 Day review completed. Medical Director ITP review done, changes made as directed, and signed approval by Medical Director. 30 Day review completed. Medical Director ITP review done, changes made as directed, and signed approval by Medical Director. 30 Day review completed. Medical Director ITP review done, changes made as directed, and signed approval by Medical Director.              Comments:

## 2023-03-18 ENCOUNTER — Ambulatory Visit: Payer: Medicare PPO

## 2023-03-19 ENCOUNTER — Ambulatory Visit: Payer: Medicare PPO

## 2023-03-22 ENCOUNTER — Other Ambulatory Visit: Payer: Self-pay

## 2023-03-22 ENCOUNTER — Encounter: Payer: Medicare PPO | Admitting: *Deleted

## 2023-03-22 ENCOUNTER — Ambulatory Visit
Admission: RE | Admit: 2023-03-22 | Discharge: 2023-03-22 | Disposition: A | Discharge status: Home / Self Care | Payer: Medicare PPO | Source: Ambulatory Visit | Attending: Radiation Oncology | Admitting: Radiation Oncology

## 2023-03-22 DIAGNOSIS — J849 Interstitial pulmonary disease, unspecified: Secondary | ICD-10-CM

## 2023-03-22 DIAGNOSIS — Z510 Encounter for antineoplastic radiation therapy: Secondary | ICD-10-CM | POA: Diagnosis not present

## 2023-03-22 LAB — RAD ONC ARIA SESSION SUMMARY
Course Elapsed Days: 18
Plan Fractions Treated to Date: 10
Plan Prescribed Dose Per Fraction: 2.2 Gy
Plan Total Fractions Prescribed: 20
Plan Total Prescribed Dose: 44 Gy
Reference Point Dosage Given to Date: 22 Gy
Reference Point Session Dosage Given: 2.2 Gy
Session Number: 10

## 2023-03-22 NOTE — Progress Notes (Signed)
Daily Session Note  Patient Details  Name: Joyce Evans MRN: [id number] Date of Birth: 05-23-1941 Referring Provider:   Flowsheet Row Pulmonary Rehab from 11/02/2022 in [facility] Cardiac and Pulmonary Rehab  Referring Provider Kloefkorn       Encounter Date: 03/22/2023  Check In:  Session Check In - 03/22/23 1555       Check-In   Supervising physician immediately available to respond to emergencies See telemetry face sheet for immediately available ER MD    Location [facility]-Cardiac & Pulmonary Rehab    Staff Present Cora Collum, RN, BSN, CCRP;Joseph Hood, RCP,RRT,BSRT;Maxon Farber BS, , Exercise Physiologist    Virtual Visit No    Medication changes reported     No    Fall or balance concerns reported    No    Warm-up and Cool-down Performed on first and last piece of equipment    Resistance Training Performed Yes    VAD Patient? No    PAD/SET Patient? No      Pain Assessment   Currently in Pain? No/denies                Social History   Tobacco Use  Smoking Status Never  Smokeless Tobacco Never    Goals Met:  Proper associated with RPD/PD & O2 Sat Independence with exercise equipment Exercise tolerated well No report of concerns or symptoms today  Goals Unmet:  Not Applicable  Comments: Pt able to follow exercise prescription today without complaint.  Will continue to monitor for progression.    Dr. Bethann Punches is Medical Director for [facility] Cardiac Rehabilitation.  Dr. Vida Rigger is Medical Director for [facility] Pulmonary Rehabilitation.

## 2023-03-23 ENCOUNTER — Other Ambulatory Visit: Payer: Self-pay

## 2023-03-23 ENCOUNTER — Other Ambulatory Visit: Payer: Self-pay | Admitting: *Deleted

## 2023-03-23 ENCOUNTER — Ambulatory Visit
Admission: RE | Admit: 2023-03-23 | Discharge: 2023-03-23 | Disposition: A | Discharge status: Home / Self Care | Payer: Medicare PPO | Source: Ambulatory Visit | Attending: Radiation Oncology | Admitting: Radiation Oncology

## 2023-03-23 DIAGNOSIS — Z510 Encounter for antineoplastic radiation therapy: Secondary | ICD-10-CM | POA: Diagnosis not present

## 2023-03-23 LAB — RAD ONC ARIA SESSION SUMMARY
Course Elapsed Days: 19
Plan Fractions Treated to Date: 11
Plan Prescribed Dose Per Fraction: 2.2 Gy
Plan Total Fractions Prescribed: 20
Plan Total Prescribed Dose: 44 Gy
Reference Point Dosage Given to Date: 24.2 Gy
Reference Point Session Dosage Given: 2.2 Gy
Session Number: 11

## 2023-03-24 ENCOUNTER — Other Ambulatory Visit: Payer: Self-pay

## 2023-03-24 ENCOUNTER — Ambulatory Visit
Admission: RE | Admit: 2023-03-24 | Discharge: 2023-03-24 | Disposition: A | Discharge status: Home / Self Care | Payer: Medicare PPO | Source: Ambulatory Visit | Attending: Radiation Oncology | Admitting: Radiation Oncology

## 2023-03-24 DIAGNOSIS — Z510 Encounter for antineoplastic radiation therapy: Secondary | ICD-10-CM | POA: Diagnosis not present

## 2023-03-24 LAB — RAD ONC ARIA SESSION SUMMARY
Course Elapsed Days: 20
Plan Fractions Treated to Date: 12
Plan Prescribed Dose Per Fraction: 2.2 Gy
Plan Total Fractions Prescribed: 20
Plan Total Prescribed Dose: 44 Gy
Reference Point Dosage Given to Date: 26.4 Gy
Reference Point Session Dosage Given: 2.2 Gy
Session Number: 12

## 2023-03-25 ENCOUNTER — Telehealth: Payer: Self-pay | Admitting: *Deleted

## 2023-03-25 ENCOUNTER — Other Ambulatory Visit: Payer: Self-pay

## 2023-03-25 ENCOUNTER — Ambulatory Visit
Admission: RE | Admit: 2023-03-25 | Discharge: 2023-03-25 | Disposition: A | Discharge status: Home / Self Care | Payer: Medicare PPO | Source: Ambulatory Visit | Attending: Radiation Oncology | Admitting: Radiation Oncology

## 2023-03-25 DIAGNOSIS — Z510 Encounter for antineoplastic radiation therapy: Secondary | ICD-10-CM | POA: Diagnosis not present

## 2023-03-25 LAB — RAD ONC ARIA SESSION SUMMARY
Course Elapsed Days: 21
Plan Fractions Treated to Date: 13
Plan Prescribed Dose Per Fraction: 2.2 Gy
Plan Total Fractions Prescribed: 20
Plan Total Prescribed Dose: 44 Gy
Reference Point Dosage Given to Date: 28.6 Gy
Reference Point Session Dosage Given: 2.2 Gy
Session Number: 13

## 2023-03-25 NOTE — Telephone Encounter (Signed)
Call from patient dental office asking fro a return call from radiation therapy nurse to give verbal clearance for patient to have a dental cleaning 9/30 as she is currently undergoing radiation therapy  on her nose. She requests this be given today as their office will be out of the office from tomorrow so today is the only time she has to get this. Please return her call

## 2023-03-26 ENCOUNTER — Ambulatory Visit: Payer: Medicare PPO

## 2023-03-29 ENCOUNTER — Ambulatory Visit: Payer: Medicare PPO

## 2023-03-29 ENCOUNTER — Encounter: Payer: Medicare PPO | Admitting: *Deleted

## 2023-03-29 DIAGNOSIS — J849 Interstitial pulmonary disease, unspecified: Secondary | ICD-10-CM

## 2023-03-29 NOTE — Progress Notes (Signed)
Daily Session Note  Patient Details  Name: Joyce Evans MRN: [id number] Date of Birth: 15-Jul-1940 Referring Provider:   Flowsheet Row Pulmonary Rehab from 11/02/2022 in [facility] Cardiac and Pulmonary Rehab  Referring Provider Kloefkorn       Encounter Date: 03/29/2023  Check In:  Session Check In - 03/29/23 1541       Check-In   Supervising physician immediately available to respond to emergencies See telemetry face sheet for immediately available ER MD    Staff Present Maxon Suzzette Righter, , Exercise Physiologist;Rhiannon Sassaman Jewel Baize, RN BSN;Joseph Reino Kent, RCP,RRT,BSRT    Virtual Visit No    Medication changes reported     No    Warm-up and Cool-down Performed on first and last piece of equipment    Resistance Training Performed Yes    VAD Patient? No    PAD/SET Patient? No      Pain Assessment   Currently in Pain? No/denies                Social History   Tobacco Use  Smoking Status Never  Smokeless Tobacco Never    Goals Met:  Independence with exercise equipment Exercise tolerated well No report of concerns or symptoms today Strength training completed today  Goals Unmet:  Not Applicable  Comments: Pt able to follow exercise prescription today without complaint.  Will continue to monitor for progression.    Dr. Bethann Punches is Medical Director for [facility] Cardiac Rehabilitation.  Dr. Vida Rigger is Medical Director for [facility] Pulmonary Rehabilitation.

## 2023-03-30 ENCOUNTER — Other Ambulatory Visit: Payer: Self-pay

## 2023-03-30 ENCOUNTER — Ambulatory Visit: Payer: Medicare PPO

## 2023-03-30 ENCOUNTER — Ambulatory Visit
Admission: RE | Admit: 2023-03-30 | Discharge: 2023-03-30 | Disposition: A | Discharge status: Home / Self Care | Payer: Medicare PPO | Source: Ambulatory Visit | Attending: Radiation Oncology | Admitting: Radiation Oncology

## 2023-03-30 DIAGNOSIS — Z510 Encounter for antineoplastic radiation therapy: Secondary | ICD-10-CM | POA: Diagnosis not present

## 2023-03-30 LAB — RAD ONC ARIA SESSION SUMMARY
Course Elapsed Days: 26
Plan Fractions Treated to Date: 14
Plan Prescribed Dose Per Fraction: 2.2 Gy
Plan Total Fractions Prescribed: 20
Plan Total Prescribed Dose: 44 Gy
Reference Point Dosage Given to Date: 30.8 Gy
Reference Point Session Dosage Given: 2.2 Gy
Session Number: 14

## 2023-03-31 ENCOUNTER — Other Ambulatory Visit: Payer: Self-pay

## 2023-03-31 ENCOUNTER — Encounter: Payer: Medicare PPO | Admitting: *Deleted

## 2023-03-31 ENCOUNTER — Ambulatory Visit
Admission: RE | Admit: 2023-03-31 | Discharge: 2023-03-31 | Disposition: A | Discharge status: Home / Self Care | Payer: Medicare PPO | Source: Ambulatory Visit | Attending: Radiation Oncology | Admitting: Radiation Oncology

## 2023-03-31 DIAGNOSIS — J849 Interstitial pulmonary disease, unspecified: Secondary | ICD-10-CM | POA: Diagnosis not present

## 2023-03-31 DIAGNOSIS — Z510 Encounter for antineoplastic radiation therapy: Secondary | ICD-10-CM | POA: Diagnosis not present

## 2023-03-31 LAB — RAD ONC ARIA SESSION SUMMARY
Course Elapsed Days: 27
Plan Fractions Treated to Date: 15
Plan Prescribed Dose Per Fraction: 2.2 Gy
Plan Total Fractions Prescribed: 20
Plan Total Prescribed Dose: 44 Gy
Reference Point Dosage Given to Date: 33 Gy
Reference Point Session Dosage Given: 2.2 Gy
Session Number: 15

## 2023-03-31 NOTE — Progress Notes (Signed)
Daily Session Note  Patient Details  Name: Joyce Evans MRN: [id number] Date of Birth: 10/12/1940 Referring Provider:   Flowsheet Row Pulmonary Rehab from 11/02/2022 in [facility] Cardiac and Pulmonary Rehab  Referring Provider Kloefkorn       Encounter Date: 03/31/2023  Check In:  Session Check In - 03/31/23 1553       Check-In   Supervising physician immediately available to respond to emergencies See telemetry face sheet for immediately available ER MD    Location [facility]-Cardiac & Pulmonary Rehab    Staff Present Maxon Suzzette Righter, , Exercise Physiologist;Zhyon Antenucci Jewel Baize, RN BSN;Joseph Reino [place], [place]    Virtual Visit No    Medication changes reported     No    Fall or balance concerns reported    No    Warm-up and Cool-down Performed on first and last piece of equipment    Resistance Training Performed Yes    VAD Patient? No    PAD/SET Patient? No      Pain Assessment   Currently in Pain? No/denies                Social History   Tobacco Use  Smoking Status Never  Smokeless Tobacco Never    Goals Met:  Independence with exercise equipment Exercise tolerated well No report of concerns or symptoms today Strength training completed today  Goals Unmet:  Not Applicable  Comments: Pt able to follow exercise prescription today without complaint.  Will continue to monitor for progression.    Dr. Bethann Punches is Medical Director for [facility] Cardiac Rehabilitation.  Dr. Vida Rigger is Medical Director for [facility] Pulmonary Rehabilitation.

## 2023-04-01 ENCOUNTER — Ambulatory Visit
Admission: RE | Admit: 2023-04-01 | Discharge: 2023-04-01 | Disposition: A | Discharge status: Home / Self Care | Payer: Medicare PPO | Source: Ambulatory Visit | Attending: Radiation Oncology | Admitting: Radiation Oncology

## 2023-04-01 ENCOUNTER — Other Ambulatory Visit: Payer: Self-pay

## 2023-04-01 ENCOUNTER — Ambulatory Visit: Payer: Medicare PPO

## 2023-04-01 DIAGNOSIS — Z510 Encounter for antineoplastic radiation therapy: Secondary | ICD-10-CM | POA: Diagnosis not present

## 2023-04-01 LAB — RAD ONC ARIA SESSION SUMMARY
Course Elapsed Days: 28
Plan Fractions Treated to Date: 16
Plan Prescribed Dose Per Fraction: 2.2 Gy
Plan Total Fractions Prescribed: 20
Plan Total Prescribed Dose: 44 Gy
Reference Point Dosage Given to Date: 35.2 Gy
Reference Point Session Dosage Given: 2.2 Gy
Session Number: 16

## 2023-04-02 ENCOUNTER — Ambulatory Visit
Admission: RE | Admit: 2023-04-02 | Discharge: 2023-04-02 | Disposition: A | Discharge status: Home / Self Care | Payer: Medicare PPO | Source: Ambulatory Visit | Attending: Radiation Oncology | Admitting: Radiation Oncology

## 2023-04-02 ENCOUNTER — Other Ambulatory Visit: Payer: Self-pay

## 2023-04-02 DIAGNOSIS — Z510 Encounter for antineoplastic radiation therapy: Secondary | ICD-10-CM | POA: Diagnosis not present

## 2023-04-02 LAB — RAD ONC ARIA SESSION SUMMARY
Course Elapsed Days: 29
Plan Fractions Treated to Date: 17
Plan Prescribed Dose Per Fraction: 2.2 Gy
Plan Total Fractions Prescribed: 20
Plan Total Prescribed Dose: 44 Gy
Reference Point Dosage Given to Date: 37.4 Gy
Reference Point Session Dosage Given: 2.2 Gy
Session Number: 17

## 2023-04-05 ENCOUNTER — Ambulatory Visit
Admission: RE | Admit: 2023-04-05 | Discharge: 2023-04-05 | Disposition: A | Discharge status: Home / Self Care | Payer: Medicare PPO | Source: Ambulatory Visit | Attending: Radiation Oncology | Admitting: Radiation Oncology

## 2023-04-05 ENCOUNTER — Other Ambulatory Visit: Payer: Self-pay

## 2023-04-05 ENCOUNTER — Ambulatory Visit: Payer: Medicare PPO

## 2023-04-05 ENCOUNTER — Encounter: Payer: Medicare PPO | Admitting: *Deleted

## 2023-04-05 DIAGNOSIS — J849 Interstitial pulmonary disease, unspecified: Secondary | ICD-10-CM | POA: Diagnosis not present

## 2023-04-05 DIAGNOSIS — Z510 Encounter for antineoplastic radiation therapy: Secondary | ICD-10-CM | POA: Diagnosis not present

## 2023-04-05 LAB — RAD ONC ARIA SESSION SUMMARY
Course Elapsed Days: 32
Plan Fractions Treated to Date: 18
Plan Prescribed Dose Per Fraction: 2.2 Gy
Plan Total Fractions Prescribed: 20
Plan Total Prescribed Dose: 44 Gy
Reference Point Dosage Given to Date: 39.6 Gy
Reference Point Session Dosage Given: 2.2 Gy
Session Number: 18

## 2023-04-05 NOTE — Progress Notes (Signed)
Daily Session Note  Patient Details  Name: Joyce Evans MRN: [id number] Date of Birth: 16-Jul-1940 Referring Provider:   Flowsheet Row Pulmonary Rehab from 11/02/2022 in [facility] Cardiac and Pulmonary Rehab  Referring Provider Kloefkorn       Encounter Date: 04/05/2023  Check In:  Session Check In - 04/05/23 1539       Check-In   Supervising physician immediately available to respond to emergencies See telemetry face sheet for immediately available ER MD    Location [facility]-Cardiac & Pulmonary Rehab    Staff Present Maxon Suzzette Righter, , Exercise Physiologist;Ilaisaane Marts Jewel Baize, RN BSN;Joseph Reino [place], [place]    Virtual Visit No    Medication changes reported     No    Fall or balance concerns reported    No    Warm-up and Cool-down Performed on first and last piece of equipment    Resistance Training Performed Yes    VAD Patient? No    PAD/SET Patient? No      Pain Assessment   Currently in Pain? No/denies                Social History   Tobacco Use  Smoking Status Never  Smokeless Tobacco Never    Goals Met:  Independence with exercise equipment Exercise tolerated well No report of concerns or symptoms today Strength training completed today  Goals Unmet:  Not Applicable  Comments: Pt able to follow exercise prescription today without complaint.  Will continue to monitor for progression.    Dr. Bethann Punches is Medical Director for [facility] Cardiac Rehabilitation.  Dr. Vida Rigger is Medical Director for [facility] Pulmonary Rehabilitation.

## 2023-04-06 ENCOUNTER — Other Ambulatory Visit: Payer: Self-pay

## 2023-04-06 ENCOUNTER — Ambulatory Visit
Admission: RE | Admit: 2023-04-06 | Discharge: 2023-04-06 | Disposition: A | Discharge status: Home / Self Care | Payer: Medicare PPO | Source: Ambulatory Visit | Attending: Radiation Oncology | Admitting: Radiation Oncology

## 2023-04-06 DIAGNOSIS — Z510 Encounter for antineoplastic radiation therapy: Secondary | ICD-10-CM | POA: Diagnosis not present

## 2023-04-06 LAB — RAD ONC ARIA SESSION SUMMARY
Course Elapsed Days: 33
Plan Fractions Treated to Date: 19
Plan Prescribed Dose Per Fraction: 2.2 Gy
Plan Total Fractions Prescribed: 20
Plan Total Prescribed Dose: 44 Gy
Reference Point Dosage Given to Date: 41.8 Gy
Reference Point Session Dosage Given: 2.2 Gy
Session Number: 19

## 2023-04-07 ENCOUNTER — Encounter: Payer: Medicare PPO | Admitting: *Deleted

## 2023-04-07 ENCOUNTER — Other Ambulatory Visit: Payer: Self-pay

## 2023-04-07 ENCOUNTER — Ambulatory Visit
Admission: RE | Admit: 2023-04-07 | Discharge: 2023-04-07 | Disposition: A | Discharge status: Home / Self Care | Payer: Medicare PPO | Source: Ambulatory Visit | Attending: Radiation Oncology | Admitting: Radiation Oncology

## 2023-04-07 ENCOUNTER — Ambulatory Visit: Payer: Medicare PPO

## 2023-04-07 DIAGNOSIS — J849 Interstitial pulmonary disease, unspecified: Secondary | ICD-10-CM | POA: Diagnosis not present

## 2023-04-07 DIAGNOSIS — Z510 Encounter for antineoplastic radiation therapy: Secondary | ICD-10-CM | POA: Diagnosis not present

## 2023-04-07 LAB — RAD ONC ARIA SESSION SUMMARY
Course Elapsed Days: 34
Plan Fractions Treated to Date: 20
Plan Prescribed Dose Per Fraction: 2.2 Gy
Plan Total Fractions Prescribed: 20
Plan Total Prescribed Dose: 44 Gy
Reference Point Dosage Given to Date: 44 Gy
Reference Point Session Dosage Given: 2.2 Gy
Session Number: 20

## 2023-04-07 NOTE — Progress Notes (Signed)
Daily Session Note  Patient Details  Name: Joyce Evans MRN: [id number] Date of Birth: 1940/08/14 Referring Provider:   Flowsheet Row Pulmonary Rehab from 11/02/2022 in [facility] Cardiac and Pulmonary Rehab  Referring Provider Kloefkorn       Encounter Date: 04/07/2023  Check In:  Session Check In - 04/07/23 1531       Check-In   Supervising physician immediately available to respond to emergencies See telemetry face sheet for immediately available ER MD    Location [facility]-Cardiac & Pulmonary Rehab    Staff Present Joyce Evans, BS, ACSM CEP, Exercise Physiologist;Joyce Katrinka Blazing, RN, ADN;Joyce Campanella Jewel Baize, RN BSN    Virtual Visit No    Medication changes reported     No    Fall or balance concerns reported    No    Warm-up and Cool-down Performed on first and last piece of equipment    Resistance Training Performed Yes    VAD Patient? No    PAD/SET Patient? No      Pain Assessment   Currently in Pain? No/denies                Social History   Tobacco Use  Smoking Status Never  Smokeless Tobacco Never    Goals Met:  Independence with exercise equipment Improved SOB with ADL's Exercise tolerated well No report of concerns or symptoms today Strength training completed today  Goals Unmet:  Not Applicable  Comments:  Joyce Evans graduated today from  rehab with 36 sessions completed.  Details of the patient's exercise prescription and what She needs to do in order to continue the prescription and progress were discussed with patient.  Patient was given a copy of prescription and goals.  Patient verbalized understanding. Arnette plans to continue to exercise by attending the CARE program.     Dr. Bethann Punches is Medical Director for [facility] Cardiac Rehabilitation.  Dr. Vida Rigger is Medical Director for [facility] Pulmonary Rehabilitation.

## 2023-04-07 NOTE — Progress Notes (Signed)
Discharge Summary   Joyce Evans DOB: [birth year]  Joyce Evans graduated today from  rehab with 36 sessions completed.  Details of the patient's exercise prescription and what She needs to do in order to continue the prescription and progress were discussed with patient.  Patient was given a copy of prescription and goals.  Patient verbalized understanding. Joyce Evans plans to continue to exercise by attending the [facility] program.   6 Minute Walk     Row Name 11/02/22 1726 03/17/23 1602       6 Minute Walk   Phase Initial Discharge    Distance 750 feet 780 feet    Distance % Change -- 4 %    Distance Feet Change -- 30 ft    Walk Time 6 minutes 6 minutes    # of Rest Breaks 0 0    MPH 1.42 1.48    METS 1.48 2.12    RPE 13 14    Perceived Dyspnea  2 3    VO2 Peak 5.16 7.43    Symptoms No Yes (comment)    Comments -- SOB    Resting HR 69 bpm 84 bpm    Resting BP 122/72 102/64    Resting Oxygen Saturation  96 % 97 %    Exercise Oxygen Saturation  during 6 min walk 85 % 85 %    Max Ex. HR 104 bpm 109 bpm    Max Ex. BP 124/72 154/82    2 Minute Post BP 114/62 128/68      Interval HR   1 Minute HR 89 77    2 Minute HR 88 92    3 Minute HR 97 109    4 Minute HR 99 106    5 Minute HR 102 97    6 Minute HR 104 95    2 Minute Post HR 60 92    Interval Heart Rate? Yes Yes      Interval Oxygen   Interval Oxygen? Yes Yes    Baseline Oxygen Saturation % 96 % 97 %    1 Minute Oxygen Saturation % 92 % 91 %    1 Minute Liters of Oxygen 4 L 4 L    2 Minute Oxygen Saturation % 87 % 85 %    2 Minute Liters of Oxygen 4 L 4 L    3 Minute Oxygen Saturation % 87 % 88 %    3 Minute Liters of Oxygen 4 L 4 L    4 Minute Oxygen Saturation % 85 % 90 %    4 Minute Liters of Oxygen 4 L 4 L    5 Minute Oxygen Saturation % 85 % 86 %    5 Minute Liters of Oxygen 4 L 4 L    6 Minute Oxygen Saturation % 86 % 86 %    6 Minute Liters of Oxygen 4 L 4 L    2 Minute Post Oxygen Saturation % 91 % 98 %    2 Minute Post  Liters of Oxygen 4 L 4 L

## 2023-04-07 NOTE — Progress Notes (Signed)
Pulmonary Individual Treatment Plan  Patient Details  Name: Joyce Evans MRN: [id number] Date of Birth: [birth year] Referring Provider:   Flowsheet Row Pulmonary Rehab from 11/02/2022 in [facility] Cardiac and Pulmonary Rehab  Referring Provider Kloefkorn       Initial Encounter Date:  Flowsheet Row Pulmonary Rehab from 11/02/2022 in  [facility] Cardiac and Pulmonary Rehab  Date 11/02/22       Visit Diagnosis: ILD (interstitial lung disease) (HCC)  Patient's Home Medications on Admission:  Current Outpatient Medications:    acetaminophen (TYLENOL) 500 MG tablet, Take 500 mg by mouth every 6 (six) hours as needed., Disp: , Rfl:    apixaban (ELIQUIS) 5 MG TABS tablet, Take 5 mg by mouth 2 (two) times daily., Disp: , Rfl:    apixaban (ELIQUIS) 5 MG TABS tablet, Take 1 tablet by mouth 2 (two) times daily., Disp: , Rfl:    Apple Cider Vinegar 300 MG TABS, Take 1 tablet by mouth daily., Disp: , Rfl:    atorvastatin (LIPITOR) 40 MG tablet, Take by mouth., Disp: , Rfl:    atorvastatin (LIPITOR) 40 MG tablet, Take 1 tablet by mouth daily., Disp: , Rfl:    brimonidine (ALPHAGAN P) 0.1 % SOLN, Place 1 drop into the right eye 2 (two) times daily., Disp: , Rfl:    brimonidine (ALPHAGAN) 0.2 % ophthalmic solution, , Disp: , Rfl:    Brinzolamide-Brimonidine 1-0.2 % SUSP, Place 1 drop into the left eye 2 (two) times daily., Disp: , Rfl:    Cholecalciferol (VITAMIN D3) 50 MCG (2000 UT) capsule, Take by mouth., Disp: , Rfl:    Cholecalciferol 50 MCG (2000 UT) CAPS, Take 3 capsules by mouth in the morning., Disp: , Rfl:    citalopram (CELEXA) 20 MG tablet, Take 10 mg by mouth daily., Disp: , Rfl:    citalopram (CELEXA) 20 MG tablet, Take by mouth., Disp: , Rfl:    fluticasone (FLONASE) 50 MCG/ACT nasal spray, Place 2 sprays into the nose daily., Disp: , Rfl:    furosemide (LASIX) 20 MG tablet, TAKE 1 TABLET ONE TIME DAILY, Disp: , Rfl:    losartan (COZAAR) 50 MG tablet, Take 1 tablet by mouth daily., Disp: ,  Rfl:    magnesium gluconate 54mg /17ml syringe, Take by mouth., Disp: , Rfl:    multivitamin-iron-minerals-folic acid (CENTRUM) chewable tablet, Chew 1 tablet by mouth daily., Disp: , Rfl:    omeprazole (PRILOSEC) 40 MG capsule, Take by mouth., Disp: , Rfl:    pantoprazole (PROTONIX) 40 MG tablet, Take 40 mg by mouth daily., Disp: , Rfl:    Probiotic Product (PROBIOTIC DAILY PO), Take by mouth daily., Disp: , Rfl:    senna-docusate (SENOKOT-S) 8.6-50 MG tablet, Take 1 tablet by mouth 2 (two) times daily., Disp: , Rfl:    timolol (BETIMOL) 0.5 % ophthalmic solution, Place 1 drop into both eyes daily., Disp: , Rfl:    White Petrolatum-Mineral Oil (SYSTANE NIGHTTIME) OINT, Apply 1 application. to eye at bedtime., Disp: , Rfl:    ZINC CITRATE PO, Take 1 tablet by mouth 2 (two) times daily., Disp: , Rfl:    zinc gluconate 3.75 mg/mL SOLN, Take 1 tablet by mouth 2 (two) times daily., Disp: , Rfl:   Past Medical History: Past Medical History:  Diagnosis Date   Actinic keratosis    Arthritis    osteo - shoulders, fingers   Atrial fibrillation (HCC)    Basal cell carcinoma 01/05/2023   dorsum nose supratip, need treatment mohs or radiation  Basal cell carcinoma (BCC) 01/05/2023   right preauricular, EDC   Dizziness    thinks because of diuretic   Family history of adverse reaction to anesthesia    daughter -PONV   Gallstones 2016, 2017   GERD (gastroesophageal reflux disease)    Glaucoma    Heart murmur    history of   Hypertension    Melanoma (HCC) 04/20/2016   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 Excised: 05/19/2016, margins free.   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   Squamous cell carcinoma of skin    R nasal labial   Tricuspid valve disorder    leak    Tobacco Use: Social History   Tobacco Use  Smoking  Status Never  Smokeless Tobacco Never    Labs: Review Flowsheet       Latest Ref Rng & Units 06/17/2020 06/18/2020  Labs for ITP Cardiac and Pulmonary Rehab  Cholestrol 0 - 200 mg/dL - 213   LDL (calc) 0 - 99 mg/dL - 086   Direct LDL 0 - 99 mg/dL 578.4  -  HDL-C >69 mg/dL - 51   Trlycerides <629 mg/dL - 57   Hemoglobin B2W 4.8 - 5.6 % 5.4  -    Details             Pulmonary Assessment Scores:  Pulmonary Assessment Scores     Row Name 11/02/22 1737 03/22/23 1643       ADL UCSD   ADL Phase Entry Exit    SOB Score total 67 53    Rest 2 1    Walk 3 2    Stairs 4 4    Bath 2 2    Dress 2 2    Shop 3 3      CAT Score   CAT Score 23 24      mMRC Score   mMRC Score 2 2             UCSD: Self-administered rating of dyspnea associated with activities of daily living (ADLs) 6-point scale (0 = "not at all" to 5 = "maximal or unable to do because of breathlessness")  Scoring Scores range from 0 to 120.  Minimally important difference is 5 units  CAT: CAT can identify the health impairment of COPD patients and is better correlated with disease progression.  CAT has a scoring range of zero to 40. The CAT score is classified into four groups of low (less than 10), medium (10 - 20), high (21-30) and very high (31-40) based on the impact level of disease on health status. A CAT score over 10 suggests significant symptoms.  A worsening CAT score could be explained by an exacerbation, poor medication adherence, poor inhaler technique, or progression of COPD or comorbid conditions.  CAT MCID is 2 points  mMRC: mMRC (Modified Medical Research Council) Dyspnea Scale is used to assess the degree of baseline functional disability in patients of respiratory disease due to dyspnea. No minimal important difference is established. A decrease in score of 1 point or greater is considered a positive change.   Pulmonary Function Assessment:  Pulmonary Function Assessment - 10/28/22  1350       Breath   Shortness of Breath Yes;Limiting activity  Exercise Target Goals: Exercise Program Goal: Individual exercise prescription set using results from initial 6 min walk test and THRR while considering  patient's activity barriers and safety.   Exercise Prescription Goal: Initial exercise prescription builds to 30-45 minutes a day of aerobic activity, 2-3 days per week.  Home exercise guidelines will be given to patient during program as part of exercise prescription that the participant will acknowledge.  Education: Aerobic Exercise: - Group verbal and visual presentation on the components of exercise prescription. Introduces F.I.T.T principle from ACSM for exercise prescriptions.  Reviews F.I.T.T. principles of aerobic exercise including progression. Written material given at graduation.   Education: Resistance Exercise: - Group verbal and visual presentation on the components of exercise prescription. Introduces F.I.T.T principle from ACSM for exercise prescriptions  Reviews F.I.T.T. principles of resistance exercise including progression. Written material given at graduation. Flowsheet Row Pulmonary Rehab from 12/25/2020 in [facility] Cardiac and Pulmonary Rehab  Date 11/06/20  Educator AS  Instruction Review Code 1- Verbalizes Understanding        Education: Exercise & Equipment Safety: - Individual verbal instruction and demonstration of equipment use and safety with use of the equipment. Flowsheet Row Pulmonary Rehab from 01/27/2023 in [facility] Cardiac and Pulmonary Rehab  Date 11/02/22  Educator [facility]  Instruction Review Code 1- Verbalizes Understanding       Education: Exercise Physiology & General Exercise Guidelines: - Group verbal and written instruction with models to review the exercise physiology of the cardiovascular system and associated critical values. Provides general exercise guidelines with specific guidelines to those with heart or lung  disease.  Flowsheet Row Pulmonary Rehab from 12/25/2020 in [facility] Cardiac and Pulmonary Rehab  Date 10/23/20  Educator AS  Instruction Review Code 1- Verbalizes Understanding       Education: Flexibility, Balance, Mind/Body Relaxation: - Group verbal and visual presentation with interactive activity on the components of exercise prescription. Introduces F.I.T.T principle from ACSM for exercise prescriptions. Reviews F.I.T.T. principles of flexibility and balance exercise training including progression. Also discusses the mind body connection.  Reviews various relaxation techniques to help reduce and manage stress (i.e. Deep breathing, progressive muscle relaxation, and visualization). Balance handout provided to take home. Written material given at graduation. Flowsheet Row Pulmonary Rehab from 01/27/2023 in [facility] Cardiac and Pulmonary Rehab  Date 12/30/22  Educator NT  Instruction Review Code 1- Verbalizes Understanding       Activity Barriers & Risk Stratification:  Activity Barriers & Cardiac Risk Stratification - 11/02/22 1728       Activity Barriers & Cardiac Risk Stratification   Activity Barriers Deconditioning;Muscular Weakness;Shortness of Breath;History of Falls;Balance Concerns;Joint Problems             6 Minute Walk:  6 Minute Walk     Row Name 11/02/22 1726 03/17/23 1602       6 Minute Walk   Phase Initial Discharge    Distance 750 feet 780 feet    Distance % Change -- 4 %    Distance Feet Change -- 30 ft    Walk Time 6 minutes 6 minutes    # of Rest Breaks 0 0    MPH 1.42 1.48    METS 1.48 2.12    RPE 13 14    Perceived Dyspnea  2 3    VO2 Peak 5.16 7.43    Symptoms No Yes (comment)    Comments -- SOB    Resting HR 69 bpm 84 bpm    Resting BP 122/72 102/64  Resting Oxygen Saturation  96 % 97 %    Exercise Oxygen Saturation  during 6 min walk 85 % 85 %    Max Ex. HR 104 bpm 109 bpm    Max Ex. BP 124/72 154/82    2 Minute Post BP 114/62 128/68       Interval HR   1 Minute HR 89 77    2 Minute HR 88 92    3 Minute HR 97 109    4 Minute HR 99 106    5 Minute HR 102 97    6 Minute HR 104 95    2 Minute Post HR 60 92    Interval Heart Rate? Yes Yes      Interval Oxygen   Interval Oxygen? Yes Yes    Baseline Oxygen Saturation % 96 % 97 %    1 Minute Oxygen Saturation % 92 % 91 %    1 Minute Liters of Oxygen 4 L 4 L    2 Minute Oxygen Saturation % 87 % 85 %    2 Minute Liters of Oxygen 4 L 4 L    3 Minute Oxygen Saturation % 87 % 88 %    3 Minute Liters of Oxygen 4 L 4 L    4 Minute Oxygen Saturation % 85 % 90 %    4 Minute Liters of Oxygen 4 L 4 L    5 Minute Oxygen Saturation % 85 % 86 %    5 Minute Liters of Oxygen 4 L 4 L    6 Minute Oxygen Saturation % 86 % 86 %    6 Minute Liters of Oxygen 4 L 4 L    2 Minute Post Oxygen Saturation % 91 % 98 %    2 Minute Post Liters of Oxygen 4 L 4 L            Oxygen Initial Assessment:  Oxygen Initial Assessment - 11/02/22 1736       Home Oxygen   Home Oxygen Device Home Concentrator;Portable Concentrator;E-Tanks    Sleep Oxygen Prescription Continuous    Liters per minute 3    Liters per minute 3    Home Resting Oxygen Prescription Continuous    Liters per minute 2    Compliance with Home Oxygen Use Yes      Initial 6 min Walk   Oxygen Used Continuous;Portable Concentrator    Liters per minute 4      Program Oxygen Prescription   Program Oxygen Prescription Continuous    Liters per minute 3      Intervention   Short Term Goals To learn and exhibit compliance with exercise, home and travel O2 prescription;To learn and understand importance of maintaining oxygen saturations>88%;To learn and demonstrate proper use of respiratory medications;To learn and understand importance of monitoring SPO2 with pulse oximeter and demonstrate accurate use of the pulse oximeter.;To learn and demonstrate proper pursed lip breathing techniques or other breathing techniques.     Long   Term Goals Verbalizes importance of monitoring SPO2 with pulse oximeter and return demonstration;Exhibits proper breathing techniques, such as pursed lip breathing or other method taught during program session;Demonstrates proper use of MDI's;Compliance with respiratory medication;Maintenance of O2 saturations>88%;Exhibits compliance with exercise, home  and travel O2 prescription             Oxygen Re-Evaluation:  Oxygen Re-Evaluation     Row Name 11/04/22 1413 11/23/22 1558 12/10/22 1558 01/25/23 1602  Program Oxygen Prescription   Program Oxygen Prescription -- Continuous Continuous Continuous    Liters per minute -- 4 4 4       Home Oxygen   Home Oxygen Device -- Home Concentrator;Portable Concentrator;E-Tanks Home Concentrator;Portable Concentrator;E-Tanks Home Concentrator;Portable Concentrator;E-Tanks    Sleep Oxygen Prescription -- Continuous Continuous Continuous    Liters per minute -- 4 4 4     Home Exercise Oxygen Prescription -- Continuous -- --    Liters per minute -- 4 4 4     Home Resting Oxygen Prescription -- Continuous Continuous Continuous    Liters per minute -- 2  on occasion will take off during rest 2 2    Compliance with Home Oxygen Use -- Yes Yes Yes      Goals/Expected Outcomes   Short Term Goals -- To learn and exhibit compliance with exercise, home and travel O2 prescription;To learn and understand importance of maintaining oxygen saturations>88%;To learn and demonstrate proper use of respiratory medications;To learn and understand importance of monitoring SPO2 with pulse oximeter and demonstrate accurate use of the pulse oximeter.;To learn and demonstrate proper pursed lip breathing techniques or other breathing techniques.  To learn and understand importance of maintaining oxygen saturations>88%;To learn and understand importance of monitoring SPO2 with pulse oximeter and demonstrate accurate use of the pulse oximeter. To learn and demonstrate proper  pursed lip breathing techniques or other breathing techniques.     Long  Term Goals -- Verbalizes importance of monitoring SPO2 with pulse oximeter and return demonstration;Exhibits proper breathing techniques, such as pursed lip breathing or other method taught during program session;Demonstrates proper use of MDI's;Compliance with respiratory medication;Maintenance of O2 saturations>88%;Exhibits compliance with exercise, home  and travel O2 prescription Maintenance of O2 saturations>88%;Verbalizes importance of monitoring SPO2 with pulse oximeter and return demonstration Exhibits proper breathing techniques, such as pursed lip breathing or other method taught during program session    Comments Reviewed PLB technique with pt.  Talked about how it works and it's importance in maintaining their exercise saturations. Joyce Evans is doing well in rehab. She is compliant with her oxygen and will use 4L with acitvity and 3-4 L at night.   During rest she will occassionaly take it off completely and still be able to maintain her saturations.  She is good about using her PLB to maintain her saturations when she notices it dropping.  She does several breathing exercises as night to help her breathing.  She does note that she has been coughing more and her doctor did start her on Mucinex to help clear muscus and cough. She has a pulse oximeter to check her oxygen saturation at home. Informed and explained why it is important to have one. Reviewed that oxygen saturations should be 88 percent and above. Patient verbalizes understanding. Diaphragmatic and PLB breathing explained and performed with patient. Patient has a better understanding of how to do these exercises to help with breathing performance and relaxation. Patient performed breathing techniques adequately and to practice further at home.    Goals/Expected Outcomes Short: Become more profiecient at using PLB.   Long: Become independent at using PLB. Short: Continue to  monitor saturations and cough Long; Continue to use PLB Short: monitor oxygen at home with exertion. Long: maintain oxygen saturations above 88 percent independently. Short: practice PLB and diaphragmatic breathing at home. Long: Use PLB and diaphragmatic breathing independently post [name].             Oxygen Discharge (Final Oxygen Re-Evaluation):  Oxygen Re-Evaluation -  01/25/23 1602       Program Oxygen Prescription   Program Oxygen Prescription Continuous    Liters per minute 4      Home Oxygen   Home Oxygen Device Home Concentrator;Portable Concentrator;E-Tanks    Sleep Oxygen Prescription Continuous    Liters per minute 4    Liters per minute 4    Home Resting Oxygen Prescription Continuous    Liters per minute 2    Compliance with Home Oxygen Use Yes      Goals/Expected Outcomes   Short Term Goals To learn and demonstrate proper pursed lip breathing techniques or other breathing techniques.     Long  Term Goals Exhibits proper breathing techniques, such as pursed lip breathing or other method taught during program session    Comments Diaphragmatic and PLB breathing explained and performed with patient. Patient has a better understanding of how to do these exercises to help with breathing performance and relaxation. Patient performed breathing techniques adequately and to practice further at home.    Goals/Expected Outcomes Short: practice PLB and diaphragmatic breathing at home. Long: Use PLB and diaphragmatic breathing independently post [name].             Initial Exercise Prescription:  Initial Exercise Prescription - 11/02/22 1700       Date of Initial Exercise RX and Referring Provider   Date 11/02/22    Referring Provider Kloefkorn      Oxygen   Oxygen Continuous    Liters 4    Maintain Oxygen Saturation 88% or higher      Treadmill   MPH 1    Grade 0    Minutes 15    METs 1.8      REL-XR   Level 1    Speed 50    Minutes 15    METs 1.48       Biostep-RELP   Level 1    SPM 50    Minutes 15    METs 1.48      Track   Laps 10    Minutes 15    METs 1.54      Prescription Details   Frequency (times per week) 2    Duration Progress to 30 minutes of continuous aerobic without signs/symptoms of physical distress      Intensity   THRR 40-80% of Max Heartrate 97-125    Ratings of Perceived Exertion 11-13    Perceived Dyspnea 0-4      Progression   Progression Continue to progress workloads to maintain intensity without signs/symptoms of physical distress.      Resistance Training   Training Prescription Yes    Weight 2    Reps 10-15             Perform Capillary Blood Glucose checks as needed.  Exercise Prescription Changes:   Exercise Prescription Changes     Row Name 11/02/22 1700 11/16/22 1400 11/23/22 1600 11/30/22 1300 12/15/22 1500     Response to Exercise   Blood Pressure (Admit) 122/72 130/70 -- 128/74 124/60   Blood Pressure (Exercise) 124/72 136/58 -- 120/68 118/64   Blood Pressure (Exit) 114/62 124/72 -- 130/76 118/60   Heart Rate (Admit) 69 bpm 67 bpm -- 68 bpm 69 bpm   Heart Rate (Exercise) 104 bpm 105 bpm -- 105 bpm 87 bpm   Heart Rate (Exit) 72 bpm 78 bpm -- 90 bpm 75 bpm   Oxygen Saturation (Admit) 96 % 98 % -- 98 % 96 %  Oxygen Saturation (Exercise) 85 % 88 % -- 90 % 88 %   Oxygen Saturation (Exit) 97 % 96 % -- 95 % 93 %   Rating of Perceived Exertion (Exercise) 13 13 -- 12 12   Perceived Dyspnea (Exercise) 2 1 -- 2 1   Symptoms none SOB -- SOB SOB   Comments 6 MWT results First three days of exercise -- -- --   Duration -- Progress to 30 minutes of  aerobic without signs/symptoms of physical distress -- Progress to 30 minutes of  aerobic without signs/symptoms of physical distress Progress to 30 minutes of  aerobic without signs/symptoms of physical distress   Intensity -- THRR unchanged -- THRR unchanged THRR unchanged     Progression   Progression -- Continue to progress  workloads to maintain intensity without signs/symptoms of physical distress. -- Continue to progress workloads to maintain intensity without signs/symptoms of physical distress. Continue to progress workloads to maintain intensity without signs/symptoms of physical distress.   Average METs -- 1.7 -- 1.5 2.2     Resistance Training   Training Prescription -- Yes -- Yes Yes   Weight -- 2 lb -- 2 lb 2 lb   Reps -- 10-15 -- 10-15 10-15     Interval Training   Interval Training -- No -- No No     Oxygen   Oxygen -- Continuous -- Continuous Continuous   Liters -- 4 -- 4 4     Treadmill   MPH -- 1.2 -- -- --   Grade -- 0 -- -- --   Minutes -- 15 -- -- --   METs -- 1.92 -- -- --     NuStep   Level -- 1 -- 1 4   Minutes -- 15 -- 30 30   METs -- 1.5 -- 1.6 2.3     T5 Nustep   Level -- -- -- 1 --   Minutes -- -- -- 15 --   METs -- -- -- 1.7 --     Biostep-RELP   Level -- 1 -- -- --   Minutes -- 15 -- -- --   METs -- 2 -- -- --     Home Exercise Plan   Plans to continue exercise at -- -- Home (comment)  walking Home (comment)  walking Home (comment)  walking   Frequency -- -- Add 2 additional days to program exercise sessions. Add 2 additional days to program exercise sessions. Add 2 additional days to program exercise sessions.   Initial Home Exercises Provided -- -- 11/23/22 11/23/22 11/23/22     Oxygen   Maintain Oxygen Saturation -- 88% or higher -- 88% or higher 88% or higher    Row Name 12/30/22 1500 01/13/23 0700 01/28/23 1000 02/09/23 1400 02/23/23 1400     Response to Exercise   Blood Pressure (Admit) 118/66 120/62 128/70 124/66 112/60   Blood Pressure (Exercise) 130/72 -- -- -- --   Blood Pressure (Exit) 122/64 130/82 124/80 138/70 122/70   Heart Rate (Admit) 69 bpm 72 bpm 70 bpm 60 bpm 70 bpm   Heart Rate (Exercise) 101 bpm 102 bpm 105 bpm 102 bpm 103 bpm   Heart Rate (Exit) 89 bpm 73 bpm 81 bpm 70 bpm 78 bpm   Oxygen Saturation (Admit) 94 % 95 % 98 % 94 % 95 %    Oxygen Saturation (Exercise) 88 % 88 % 88 % 89 % 89 %   Oxygen Saturation (Exit) 93 % 95 % 97 %  97 % 95 %   Rating of Perceived Exertion (Exercise) 16 14 13 14 13    Perceived Dyspnea (Exercise) 3 3 3 3 3    Symptoms SOB SOB SOB SOB SOB   Duration Progress to 30 minutes of  aerobic without signs/symptoms of physical distress Progress to 30 minutes of  aerobic without signs/symptoms of physical distress Progress to 30 minutes of  aerobic without signs/symptoms of physical distress Continue with 30 min of aerobic exercise without signs/symptoms of physical distress. Continue with 30 min of aerobic exercise without signs/symptoms of physical distress.   Intensity THRR unchanged THRR unchanged THRR unchanged THRR unchanged THRR unchanged     Progression   Progression Continue to progress workloads to maintain intensity without signs/symptoms of physical distress. Continue to progress workloads to maintain intensity without signs/symptoms of physical distress. Continue to progress workloads to maintain intensity without signs/symptoms of physical distress. Continue to progress workloads to maintain intensity without signs/symptoms of physical distress. Continue to progress workloads to maintain intensity without signs/symptoms of physical distress.   Average METs 1.96 2.1 2.13 2.41 1.8     Resistance Training   Training Prescription Yes Yes Yes Yes Yes   Weight 2 lb 2 lb 2 lb 2 lb 2 lb   Reps 10-15 10-15 10-15 10-15 10-15     Interval Training   Interval Training No No No No No     Oxygen   Oxygen Continuous Continuous Continuous Continuous Continuous   Liters 4 4 4 4 4      Treadmill   MPH -- 1.2 1.5 -- --   Grade -- 0 0 -- --   Minutes -- 15 15 -- --   METs -- 1.92 2.15 -- --     NuStep   Level 4 5 -- 4 5   Minutes 15 15 -- 15 15   METs 1.7 2 -- 1.8 2     REL-XR   Level 1 1 2 2  --   Minutes 15 15 15 15  --   METs 3.9 3.4 3.3 3.9 --     T5 Nustep   Level 1 1 2 2 2    Minutes 15 15 15  15 15    METs 3.9 2 1.7 1.8 2     Track   Laps 12 -- 20 15 11    Minutes 15 -- 15 15 15    METs 1.65 -- 2.09 1.82 1.6     Home Exercise Plan   Plans to continue exercise at Home (comment)  walking Home (comment)  walking Home (comment)  walking Home (comment)  walking Home (comment)  walking   Frequency Add 2 additional days to program exercise sessions. Add 2 additional days to program exercise sessions. Add 2 additional days to program exercise sessions. Add 2 additional days to program exercise sessions. Add 2 additional days to program exercise sessions.   Initial Home Exercises Provided 11/23/22 11/23/22 11/23/22 11/23/22 11/23/22     Oxygen   Maintain Oxygen Saturation 88% or higher 88% or higher 88% or higher 88% or higher 88% or higher    Row Name 03/08/23 1400 03/25/23 0800           Response to Exercise   Blood Pressure (Admit) 128/70 102/64      Blood Pressure (Exercise) 164/80 --      Blood Pressure (Exit) 112/66 124/72      Heart Rate (Admit) 82 bpm 84 bpm      Heart Rate (Exercise) 107 bpm 109 bpm  Heart Rate (Exit) 80 bpm 71 bpm      Oxygen Saturation (Admit) 95 % 97 %      Oxygen Saturation (Exercise) 88 % 88 %      Oxygen Saturation (Exit) 94 % 96 %      Rating of Perceived Exertion (Exercise) 14 14      Perceived Dyspnea (Exercise) 3 3      Symptoms SOB SOB      Duration Continue with 30 min of aerobic exercise without signs/symptoms of physical distress. Continue with 30 min of aerobic exercise without signs/symptoms of physical distress.      Intensity THRR unchanged THRR unchanged        Progression   Progression Continue to progress workloads to maintain intensity without signs/symptoms of physical distress. Continue to progress workloads to maintain intensity without signs/symptoms of physical distress.      Average METs 2.03 2.03        Resistance Training   Training Prescription Yes Yes      Weight 2 lb 2 lb      Reps 10-15 10-15        Interval  Training   Interval Training No No        Oxygen   Oxygen Continuous Continuous      Liters 4 4        NuStep   Level 5 5      Minutes 15 30      METs 2.2 2.1        REL-XR   Level 3 --      Minutes 15 --      METs 2.8 --        T5 Nustep   Level 4 3      Minutes 15 30      METs 2 2        Track   Laps 11 --      Minutes 15 --      METs 1.6 --        Home Exercise Plan   Plans to continue exercise at Home (comment)  walking Home (comment)  walking      Frequency Add 2 additional days to program exercise sessions. Add 2 additional days to program exercise sessions.      Initial Home Exercises Provided 11/23/22 11/23/22        Oxygen   Maintain Oxygen Saturation 88% or higher 88% or higher               Exercise Comments:   Exercise Comments     Row Name 11/04/22 1411           Exercise Comments First full day of exercise!  Patient was oriented to gym and equipment including functions, settings, policies, and procedures.  Patient's individual exercise prescription and treatment plan were reviewed.  All starting workloads were established based on the results of the 6 minute walk test done at initial orientation visit.  The plan for exercise progression was also introduced and progression will be customized based on patient's performance and goals.                Exercise Goals and Review:   Exercise Goals     Row Name 11/02/22 1731             Exercise Goals   Increase Physical Activity Yes       Intervention Provide advice, education, support and counseling about physical activity/exercise needs.;Develop an  individualized exercise prescription for aerobic and resistive training based on initial evaluation findings, risk stratification, comorbidities and participant's personal goals.       Expected Outcomes Short Term: Attend rehab on a regular basis to increase amount of physical activity.;Long Term: Add in home exercise to make exercise part of  routine and to increase amount of physical activity.;Long Term: Exercising regularly at least 3-5 days a week.       Increase Strength and Stamina Yes       Intervention Provide advice, education, support and counseling about physical activity/exercise needs.;Develop an individualized exercise prescription for aerobic and resistive training based on initial evaluation findings, risk stratification, comorbidities and participant's personal goals.       Expected Outcomes Short Term: Increase workloads from initial exercise prescription for resistance, speed, and METs.;Short Term: Perform resistance training exercises routinely during rehab and add in resistance training at home;Long Term: Improve cardiorespiratory fitness, muscular endurance and strength as measured by increased METs and functional capacity ( )       Able to understand and use rate of perceived exertion (RPE) scale Yes       Intervention Provide education and explanation on how to use RPE scale       Expected Outcomes Short Term: Able to use RPE daily in rehab to express subjective intensity level;Long Term:  Able to use RPE to guide intensity level when exercising independently       Able to understand and use Dyspnea scale Yes       Intervention Provide education and explanation on how to use Dyspnea scale       Expected Outcomes Short Term: Able to use Dyspnea scale daily in rehab to express subjective sense of shortness of breath during exertion;Long Term: Able to use Dyspnea scale to guide intensity level when exercising independently       Knowledge and understanding of Target Heart Rate Range (THRR) Yes       Intervention Provide education and explanation of THRR including how the numbers were predicted and where they are located for reference       Expected Outcomes Short Term: Able to state/look up THRR;Long Term: Able to use THRR to govern intensity when exercising independently;Short Term: Able to use daily as guideline for  intensity in rehab       Able to check pulse independently Yes       Intervention Provide education and demonstration on how to check pulse in carotid and radial arteries.;Review the importance of being able to check your own pulse for safety during independent exercise       Expected Outcomes Short Term: Able to explain why pulse checking is important during independent exercise       Understanding of Exercise Prescription Yes       Intervention Provide education, explanation, and written materials on patient's individual exercise prescription       Expected Outcomes Short Term: Able to explain program exercise prescription;Long Term: Able to explain home exercise prescription to exercise independently                Exercise Goals Re-Evaluation :  Exercise Goals Re-Evaluation     Row Name 11/04/22 1411 11/16/22 1426 11/23/22 1545 11/30/22 1401 12/15/22 1525     Exercise Goal Re-Evaluation   Exercise Goals Review Increase Physical Activity;Able to understand and use rate of perceived exertion (RPE) scale;Knowledge and understanding of Target Heart Rate Range (THRR);Understanding of Exercise Prescription;Increase Strength and Stamina;Able to understand and use  Dyspnea scale;Able to check pulse independently Increase Physical Activity;Increase Strength and Stamina;Understanding of Exercise Prescription Increase Physical Activity;Increase Strength and Stamina;Understanding of Exercise Prescription Increase Physical Activity;Increase Strength and Stamina;Understanding of Exercise Prescription Increase Physical Activity;Increase Strength and Stamina;Understanding of Exercise Prescription   Comments Reviewed RPE scale, THR and program prescription with pt today.  Pt voiced understanding and was given a copy of goals to take home. Joyce Evans is off to a good start in the program. She has done well with the T4 nustep and biostep at level 1. She also was able to walk up to 1.2 mph on the treadmill with no  incline. She had an overall average MET level of 1.7 METs during her first three sessions of rehab. We will continue to monitor her progress in the program. Joyce Evans is doing well in rehab.  She was out on vacation for two weeks.  She did walk some with her cane and her rollator.  She has noticed that her saturations will drop with walking so she will need to take a rest break to do her PLB to help hre recover. She was grateful to be able ot get away.  She also did her PT exercises and strength work while away.  eviewed home exercise with pt today.  Pt plans to walk at home for exercise.  Reviewed THR, pulse, RPE, sign and symptoms, pulse oximetery and when to call 911 or MD.  Also discussed weather considerations and indoor options.  Pt voiced understanding. Also reviewed goals with patient.  Service time [phone] Joyce Evans continues to do well in rehab. She remains at level 1 on the T4 and T5 Nustep, we will encourage patient to increase her workload on the seated machines. Her RPEs are staying appropriate. We will continue to monitor. Joyce Evans is doing well in rehab since returning after having a fall. During her one session in rehab since the last review she was able to work on the T4 nustep for 30 minutes. She was also able to improve her workload up to level 4 on the T4. She has continued to do well with 2 lb hand weights for resistance training as well. We will continue to monitor her progress in the program.   Expected Outcomes Short: Use RPE daily to regulate intensity.  Long: Follow program prescription in THR. Short: Continue to follow current exercise prescription. Long: Continue to improve strength and stamina. Shrot: Back to routine exercise in rehab Long; Conitnue to improve stamina Short: Increase to level 2 on the T4 Nustep Long: Continue to increase overall MET level and stamina Short: Return to regular attendance in rehab. Long: Continue to increase overall MET level and stamina.    Row Name 12/30/22 1607  01/13/23 0755 01/28/23 1027 02/09/23 1445 02/23/23 1409     Exercise Goal Re-Evaluation   Exercise Goals Review Increase Physical Activity;Increase Strength and Stamina;Understanding of Exercise Prescription Increase Physical Activity;Increase Strength and Stamina;Understanding of Exercise Prescription Increase Physical Activity;Increase Strength and Stamina;Understanding of Exercise Prescription Increase Physical Activity;Increase Strength and Stamina;Understanding of Exercise Prescription Increase Physical Activity;Increase Strength and Stamina;Understanding of Exercise Prescription   Comments Joyce Evans is doing well in rehab. She has kept her workloads consistent on all seated machines. She was able to increase her number of laps on the track from 10 to 12 laps. We will continue to monitor her progress in the program. Joyce Evans is doing well in rehab. She recently improved to level 5 on the T4 nustep and began using the treadmill again at  a speed of 1.2 mph with no incline. Her O2 saturations have also continued to stay above 88% during exercise. We will continue to monitor her progress in the program. Joyce Evans continues to do well in rehab. She recently improved to level 2 on the T5 and XR. She also increased her treadmill workload to a speed of 1.5 mph with no incline. She increased her laps on the track to 20 laps. We will continue to monitor her progress in the program. Joyce Evans is doing well in rehab. Her laps on the track were down from 20 to 15 laps since the last review. However, her workloads on the T4 nustep, T5 nustep, and XR have stayed consistent. We will continue to monitor her progress in the program. Joyce Evans continues to do well in rehab. She recently increased her T4 level from 3 to 5. Her track laps have decreased again to 11 from 15, we will continue to encourage her return to former levels. We will continue to monitor her progress in the program.   Expected Outcomes Short: Continue to push for more laps on  track. Long: Continue to improve strength and stamina. Short: Continue to progressively increase treadmill speed. Long: Continue to improve strength and stamina. Short: Continue to progressively increase treadmill workload. Long: Continue exercise to improve strength and stamina. Short: Increase laps on the track to previous workload. Long: Continue exercise to improve strength and stamina. Short: Increase laps on the track to previous workload. Long: Continue exercise to improve strength and stamina.    Row Name 03/08/23 1414 03/25/23 0831           Exercise Goal Re-Evaluation   Exercise Goals Review Increase Physical Activity;Increase Strength and Stamina;Understanding of Exercise Prescription Increase Physical Activity;Increase Strength and Stamina;Understanding of Exercise Prescription      Comments Joyce Evans continues to do well in rehab. She increased her level on the T5 Nustep from 2 to 4. She also increased her level on the REXR from 2 to 3. We will continue to monitor her progress in the program. Joyce Evans continues to do well in rehab. She has stayed consistent at level 5 on the T4 nustep and level 3 on the T5 nustep. She also completed her post and improved by 30 ft. We will continue to monitor her progress in the program.      Expected Outcomes Short: Continue to follow current exercise prescription, and progressively increase workloads. Long: Continue exercise to improve strength and stamina. Short: Graduate. Long: Continue to exercise independently.               Discharge Exercise Prescription (Final Exercise Prescription Changes):  Exercise Prescription Changes - 03/25/23 0800       Response to Exercise   Blood Pressure (Admit) 102/64    Blood Pressure (Exit) 124/72    Heart Rate (Admit) 84 bpm    Heart Rate (Exercise) 109 bpm    Heart Rate (Exit) 71 bpm    Oxygen Saturation (Admit) 97 %    Oxygen Saturation (Exercise) 88 %    Oxygen Saturation (Exit) 96 %    Rating of  Perceived Exertion (Exercise) 14    Perceived Dyspnea (Exercise) 3    Symptoms SOB    Duration Continue with 30 min of aerobic exercise without signs/symptoms of physical distress.    Intensity THRR unchanged      Progression   Progression Continue to progress workloads to maintain intensity without signs/symptoms of physical distress.    Average METs 2.03  Resistance Training   Training Prescription Yes    Weight 2 lb    Reps 10-15      Interval Training   Interval Training No      Oxygen   Oxygen Continuous    Liters 4      NuStep   Level 5    Minutes 30    METs 2.1      T5 Nustep   Level 3    Minutes 30    METs 2      Home Exercise Plan   Plans to continue exercise at Home (comment)   walking   Frequency Add 2 additional days to program exercise sessions.    Initial Home Exercises Provided 11/23/22      Oxygen   Maintain Oxygen Saturation 88% or higher             Nutrition:  Target Goals: Understanding of nutrition guidelines, daily intake of sodium 1500mg , cholesterol 200mg , calories 30% from fat and 7% or less from saturated fats, daily to have 5 or more servings of fruits and vegetables.  Education: All About Nutrition: -Group instruction provided by verbal, written material, interactive activities, discussions, models, and posters to present general guidelines for heart healthy nutrition including fat, fiber, MyPlate, the role of sodium in heart healthy nutrition, utilization of the nutrition label, and utilization of this knowledge for meal planning. Follow up email sent as well. Written material given at graduation. Flowsheet Row Pulmonary Rehab from 12/25/2020 in [facility] Card[facility] Pulmonary Rehab  Date 11/20/20  Educator [facility]  Instruction Review Code 1- Verbalizes Understanding       Biometrics:  Pre Biometrics - 11/02/22 1732       Pre Biometrics   Height 5\' 2"  (1.575 m)    Weight 129 lb (58.5 kg)    Waist Circumference 33 inches    Hip  Circumference 39 inches    Waist to Hip Ratio 0.85 %    BMI (Calculated) 23.59    Single Leg Stand 2.78 seconds             Post Biometrics - 03/17/23 1605        Post  Biometrics   Height 5\' 2"  (1.575 m)    Weight 131 lb 4.8 oz (59.6 kg)    Waist Circumference 33 inches    Hip Circumference 37 inches    Waist to Hip Ratio 0.89 %    BMI (Calculated) 24.01    Single Leg Stand 6.7 seconds             Nutrition Therapy Plan and Nutrition Goals:  Nutrition Therapy & Goals - 01/27/23 1609       Nutrition Therapy   Diet Mediterranean, low Na    Protein (specify units) 90    Fiber 25 grams    Whole Grain Foods 3 servings    Saturated Fats 15 max. grams    Fruits and Vegetables 5 servings/day    Sodium 2 grams      Personal Nutrition Goals   Nutrition Goal Eat smaller more frequent meals    Personal Goal #2 Pair carbs with protein/fat    Comments Patient drinking mostly water. Reviewed 24hr food recall. She struggles to get larger meals in, her husband wants to make sure she gets her nutrition in and makes her large meals with good foods. Talked about her food choices are good, but that eating smaller more frequent meals will help her get more nutrition in without  feeling bloated or overwhelmed with the quantity of food. Educated on healthy fats, their nutrient and calorie density. Encouraged her to break up her large breakfast into smaller meals/snacks with complex carbs paired with quality protein or healthy fat. Reviewed mediterranean diet handout, built out several meals and snacks with foods she likes and will eat. 24-hours Recall:  B: scrambled eggs or fried egg on toast, fruits like berries/cherries, crunchy peanut butter toast, drinks premier protein shake  L: half tomato sandwich  D: pinto beans, cabbage, left over chicken pie, dinner roll      Intervention Plan   Intervention Nutrition handout(s) given to patient.;Prescribe, educate and counsel regarding  individualized specific dietary modifications aiming towards targeted core components such as weight, hypertension, lipid management, diabetes, heart failure and other comorbidities.    Expected Outcomes Short Term Goal: Understand basic principles of dietary content, such as calories, fat, sodium, cholesterol and nutrients.;Short Term Goal: A plan has been developed with personal nutrition goals set during dietitian appointment.;Long Term Goal: Adherence to prescribed nutrition plan.             Nutrition Assessments:  MEDIFICTS Score Key: >=70 Need to make dietary changes  40-70 Heart Healthy Diet <= 40 Therapeutic Level Cholesterol Diet  Flowsheet Row Pulmonary Rehab from 03/22/2023 in [facility] Cardiac and Pulmonary Rehab  Picture Your Plate Total Score on Admission 81  Picture Your Plate Total Score on Discharge 87      Picture Your Plate Scores: <11 Unhealthy dietary pattern with much room for improvement. 41-50 Dietary pattern unlikely to meet recommendations for good health and room for improvement. 51-60 More healthful dietary pattern, with some room for improvement.  >60 Healthy dietary pattern, although there may be some specific behaviors that could be improved.   Nutrition Goals Re-Evaluation:  Nutrition Goals Re-Evaluation     Row Name 11/23/22 1551 12/10/22 1603 01/25/23 1606         Goals   Current Weight -- 130 lb (59 kg) --     Nutrition Goal Schedule appt with dietitian (husband diabetic) Eat smaller meals Meet with dietitian     Comment Joyce Evans would like to set up an appointment to meet with dietitian.  She would like to work on her diet and her husbands as well.  She is working on watching sodium and getting in a good variety. Patient was informed on why it is important to maintain a balanced diet when dealing with Respiratory issues. Explained that it takes a lot of energy to breath and when they are short of breath often they will need to have a good diet to help  keep up with the calories they are expending for breathing. Haaniya would like to meet with RD.     Expected Outcome Short: Set up dietitian appt Long: continue to work diet Short: Choose and plan snacks accordingly to patients caloric intake to improve breathing. Long: Maintain a diet independently that meets their caloric intake to aid in daily shortness of breath. Short: meet with RD. Long: adhere to a diet to pertain to her.              Nutrition Goals Discharge (Final Nutrition Goals Re-Evaluation):  Nutrition Goals Re-Evaluation - 01/25/23 1606       Goals   Nutrition Goal Meet with dietitian    Comment Joyce Evans would like to meet with RD.    Expected Outcome Short: meet with RD. Long: adhere to a diet to pertain to her.  Psychosocial: Target Goals: Acknowledge presence or absence of significant depression and/or stress, maximize coping skills, provide positive support system. Participant is able to verbalize types and ability to use techniques and skills needed for reducing stress and depression.   Education: Stress, Anxiety, and Depression - Group verbal and visual presentation to define topics covered.  Reviews how body is impacted by stress, anxiety, and depression.  Also discusses healthy ways to reduce stress and to treat/manage anxiety and depression.  Written material given at graduation. Flowsheet Row Pulmonary Rehab from 12/25/2020 in [facility] Cardiac and Pulmonary Rehab  Date 10/16/20  Educator [facility]  Instruction Review Code 1- [employer] Understanding       Education: Sleep Hygiene -Provides group verbal and written instruction about how sleep can affect your health.  Define sleep hygiene, discuss sleep cycles and impact of sleep habits. Review good sleep hygiene tips.  Flowsheet Row Cardiac Rehab from 05/30/2018 in [facility] Cardiac and Pulmonary Rehab  Date 03/16/18  Educator [facility]  Instruction Review Code 1- Verbalizes Understanding       Initial Review &  Psychosocial Screening:  Initial Psych Review & Screening - 10/28/22 1352       Initial Review   Current issues with Current Anxiety/Panic      Family Dynamics   Good Support System? Yes    Comments Kristye can look to her husband and her two daughters for support. Her daughters call her almost everyday. She also has alot of faith that gets her through alot.      Barriers   Psychosocial barriers to participate in program The patient should benefit from training in stress management and relaxation.      Screening Interventions   Interventions To provide support and resources with identified psychosocial needs;Encouraged to exercise;Provide feedback about the scores to participant    Expected Outcomes Short Term goal: Utilizing psychosocial counselor, staff and physician to assist with identification of specific Stressors or current issues interfering with healing process. Setting desired goal for each stressor or current issue identified.;Long Term Goal: Stressors or current issues are controlled or eliminated.;Short Term goal: Identification and review with participant of any Quality of Life or Depression concerns found by scoring the questionnaire.;Long Term goal: The participant improves quality of Life and PHQ9 Scores as seen by post scores and/or verbalization of changes             Quality of Life Scores:  Scores of 19 and below usually indicate a poorer quality of life in these areas.  A difference of  2-3 points is a clinically meaningful difference.  A difference of 2-3 points in the total score of the Quality of Life Index has been associated with significant improvement in overall quality of life, self-image, physical symptoms, and general health in studies assessing change in quality of life.  PHQ-9: Review Flowsheet  More data exists      03/22/2023 11/02/2022 01/02/2021 09/02/2020 05/30/2018  Depression screen PHQ 2/9  Decreased Interest 0 0 0 0 0  Down, Depressed, Hopeless 0  0 0 0 0  PHQ - 2 Score 0 0 0 0 0  Altered sleeping 0 1 1 - -  Tired, decreased energy 1 1 1 1 1   Change in appetite 0 - 0 1 0  Feeling bad or failure about yourself  0 0 0 0 0  Trouble concentrating 0 0 0 0 0  Moving slowly or fidgety/restless 0 0 0 0 0  Suicidal thoughts 0 0 0 0 0  PHQ-9 Score 1 2 2  - -  Difficult doing work/chores Not difficult at all - Not difficult at all Not difficult at all -    Details           Interpretation of Total Score  Total Score Depression Severity:  1-4 = Minimal depression, 5-9 = Mild depression, 10-14 = Moderate depression, 15-19 = Moderately severe depression, 20-27 = Severe depression   Psychosocial Evaluation and Intervention:  Psychosocial Evaluation - 10/28/22 1353       Psychosocial Evaluation & Interventions   Interventions Encouraged to exercise with the program and follow exercise prescription;Relaxation education;Stress management education    Comments Joyce Evans can look to her husband and her two daughters for support. Her daughters call her almost everyday. She also has alot of faith that gets her through alot.    Expected Outcomes Short: Start [name] to help with mood. Long: Maintain a healthy mental state.    Continue Psychosocial Services  Follow up required by staff             Psychosocial Re-Evaluation:  Psychosocial Re-Evaluation     Row Name 11/23/22 1548 12/10/22 1605 03/29/23 1543         Psychosocial Re-Evaluation   Current issues with Current Stress Concerns None Identified Current Stress Concerns     Comments Joyce Evans returned today from a two week vacation.  She got to go to [facility] for vacation.  She enjoyed going away as this was the first time she was able to get away since first getting sick.  She enjoyed getting out and seeing some of the sights and going through gardens as well. She continues to sleep well and feels good overall.  Her biggest frustration is not beinig able to breathe as well with  acitvity.  She also admits to forgetting to breathe on occassion when doing things. Patient reports no issues with their current mental states, sleep, stress, depression or anxiety. Will follow up with patient in a few weeks for any changes. Reviewed patient health questionnaire (PHQ-9) with patient for follow up. Previously, patients score indicated signs/symptoms of depression.  Reviewed to see if patient is improving symptom wise while in program.  Score improved/declined and patient states that it is because she has been able to exercise and try to better her health.     Expected Outcomes short: COntinue to exercise for mood boost Long: Conitnue to stay positive Short: Continue to exercise regularly to support mental health and notify staff of any changes. Long: maintain mental health and well being through teaching of rehab or prescribed medications independently. Short: Continue to attend [name] regularly for regular exercise and social engagement. Long: Continue to improve symptoms and manage a positive mental state.     Interventions Encouraged to attend [facility]monary Rehabilitation for the exercise Encouraged to attend Pulmonary Rehabilitation for the exercise Encouraged to attend Pulmonary Rehabilitation for the exercise     Continue Psychosocial Services  -- Follow up required by staff Follow up required by staff              Psychosocial Discharge (Final Psychosocial Re-Evaluation):  Psychosocial Re-Evaluation - 03/29/23 1543       Psychosocial Re-Evaluation   Current issues with Current Stress Concerns    Comments Reviewed patient health questionnaire (PHQ-9) with patient for follow up. Previously, patients score indicated signs/symptoms of depression.  Reviewed to see if patient is improving symptom wise while in program.  Score improved/declined and patient states that  it is because she has been able to exercise and try to better her health.    Expected Outcomes Short: Continue to  attend [name] regularly for regular exercise and social engagement. Long: Continue to improve symptoms and manage a positive mental state.    Interventions Encouraged to attend [facility] Rehabilitation for the exercise    Continue Psychosocial Services  Follow up required by staff             Education: Education Goals: Education classes will be provided on a weekly basis, covering required topics. Participant will state understanding/return demonstration of topics presented.  Learning Barriers/Preferences:  Learning Barriers/Preferences - 10/28/22 1351       Learning Barriers/Preferences   Learning Barriers None    Learning Preferences None             General Pulmonary Education Topics:  Infection Prevention: - Provides verbal and written material to individual with discussion of infection control including proper hand washing and proper equipment cleaning during exercise session. Flowsheet Row Pulmonary Rehab from 01/27/2023 in [facility] Cardiac and Pulmonary Rehab  Date 11/02/22  Educator [facility]  Instruction Review Code 1- Verbalizes Understanding       Falls Prevention: - Provides verbal and written material to individual with discussion of falls prevention and safety. Flowsheet Row Pulmonary Rehab from 01/27/2023 in [facility] Cardiac and Pulmonary Rehab  Date 11/02/22  Educator [facility]  Instruction Review Code 1- Verbalizes Understanding       Chronic Lung Disease Review: - Group verbal instruction with posters, models, PowerPoint presentations and videos,  to review new updates, new respiratory medications, new advancements in procedures and treatments. Providing information on websites and "800" numbers for continued self-education. Includes information about supplement oxygen, available portable oxygen systems, continuous and intermittent flow rates, oxygen safety, concentrators, and Medicare reimbursement for oxygen. Explanation of Pulmonary Drugs, including class,  frequency, complications, importance of spacers, rinsing mouth after steroid MDI's, and proper cleaning methods for nebulizers. Review of basic lung anatomy and physiology related to function, structure, and complications of lung disease. Review of risk factors. Discussion about methods for diagnosing sleep apnea and types of masks and machines for OSA. Includes a review of the use of types of environmental controls: home humidity, furnaces, filters, dust mite/pet prevention, HEPA vacuums. Discussion about weather changes, air quality and the benefits of nasal washing. Instruction on Warning signs, infection symptoms, calling MD promptly, preventive modes, and value of vaccinations. Review of effective airway clearance, coughing and/or vibration techniques. Emphasizing that all should Create an Action Plan. Written material given at graduation. Flowsheet Row Pulmonary Rehab from 01/27/2023 in [facility] Cardiac and Pulmonary Rehab  Education need identified 11/02/22  Date 11/25/22  Educator [facility]  Instruction Review Code 1- Verbalizes Understanding       AED/CPR: - Group verbal and written instruction with the use of models to demonstrate the basic use of the AED with the basic ABC's of resuscitation.    Anatomy and Cardiac Procedures: - Group verbal and visual presentation and models provide information about basic cardiac anatomy and function. Reviews the testing methods done to diagnose heart disease and the outcomes of the test results. Describes the treatment choices: Medical Management, Angioplasty, or Coronary Bypass Surgery for treating various heart conditions including Myocardial Infarction, Angina, Valve Disease, and Cardiac Arrhythmias.  Written material given at graduation. Flowsheet Row Pulmonary Rehab from 12/25/2020 in [facility] Cardiac and Pulmonary Rehab  Date 11/06/20  Educator [facility]  Instruction Review Code 1- Verbalizes Understanding  Medication Safety: - Group verbal and visual  instruction to review commonly prescribed medications for heart and lung disease. Reviews the medication, class of the drug, and side effects. Includes the steps to properly store meds and maintain the prescription regimen.  Written material given at graduation. Flowsheet Row Pulmonary Rehab from 01/27/2023 in [facility] Cardiac and Pulmonary Rehab  Date 01/20/23  Educator [facility]  Instruction Review Code 1- Verbalizes Understanding       Other: -Provides group and verbal instruction on various topics (see comments)   Knowledge Questionnaire Score:  Knowledge Questionnaire Score - 03/22/23 1643       Knowledge Questionnaire Score   Pre Score 17/18    Post Score 17/18              Core Components/Risk Factors/Patient Goals at Admission:  Personal Goals and Risk Factors at Admission - 11/02/22 1735       Core Components/Risk Factors/Patient Goals on Admission    Weight Management Yes;Weight Maintenance    Intervention Weight Management: Develop a combined nutrition and exercise program designed to reach desired caloric intake, while maintaining appropriate intake of nutrient and fiber, sodium and fats, and appropriate energy expenditure required for the weight goal.;Weight Management: Provide education and appropriate resources to help participant work on and attain dietary goals.;Weight Management/Obesity: Establish reasonable short term and long term weight goals.    Admit Weight 129 lb (58.5 kg)    Goal Weight: Short Term 129 lb (58.5 kg)    Goal Weight: Long Term 129 lb (58.5 kg)    Expected Outcomes Short Term: Continue to assess and modify interventions until short term weight is achieved;Long Term: Adherence to nutrition and physical activity/exercise program aimed toward attainment of established weight goal;Understanding recommendations for meals to include 15-35% energy as protein, 25-35% energy from fat, 35-60% energy from carbohydrates, less than 200mg  of dietary cholesterol, 20-35  gm of total fiber daily;Understanding of distribution of calorie intake throughout the day with the consumption of 4-5 meals/snacks;Weight Maintenance: Understanding of the daily nutrition guidelines, which includes 25-35% calories from fat, 7% or less cal from saturated fats, less than 200mg  cholesterol, less than 1.5gm of sodium, & 5 or more servings of fruits and vegetables daily    Improve shortness of breath with ADL's Yes    Intervention Provide education, individualized exercise plan and daily activity instruction to help decrease symptoms of SOB with activities of daily living.    Expected Outcomes Short Term: Improve cardiorespiratory fitness to achieve a reduction of symptoms when performing ADLs;Long Term: Be able to perform more ADLs without symptoms or delay the onset of symptoms    Heart Failure Yes    Intervention Provide a combined exercise and nutrition program that is supplemented with education, support and counseling about heart failure. Directed toward relieving symptoms such as shortness of breath, decreased exercise tolerance, and extremity edema.    Expected Outcomes Improve functional capacity of life;Short term: Attendance in program 2-3 days a week with increased exercise capacity. Reported lower sodium intake. Reported increased fruit and vegetable intake. Reports medication compliance.;Short term: Daily weights obtained and reported for increase. Utilizing diuretic protocols set by physician.;Long term: Adoption of self-care skills and reduction of barriers for early signs and symptoms recognition and intervention leading to self-care maintenance.    Hypertension Yes    Intervention Provide education on lifestyle modifcations including regular physical activity/exercise, weight management, moderate sodium restriction and increased consumption of fresh fruit, vegetables, and low fat dairy, alcohol moderation, and  smoking cessation.;Monitor prescription use compliance.    Expected  Outcomes Short Term: Continued assessment and intervention until BP is < 140/41mm HG in hypertensive participants. < 130/57mm HG in hypertensive participants with diabetes, heart failure or chronic kidney disease.;Long Term: Maintenance of blood pressure at goal levels.    Lipids Yes    Intervention Provide education and support for participant on nutrition & aerobic/resistive exercise along with prescribed medications to achieve LDL 70mg , HDL >40mg .    Expected Outcomes Short Term: Participant states understanding of desired cholesterol values and is compliant with medications prescribed. Participant is following exercise prescription and nutrition guidelines.;Long Term: Cholesterol controlled with medications as prescribed, with individualized exercise RX and with personalized nutrition plan. Value goals: LDL < 70mg , HDL > 40 mg.             Education:Diabetes - Individual verbal and written instruction to review signs/symptoms of diabetes, desired ranges of glucose level fasting, after meals and with exercise. Acknowledge that pre and post exercise glucose checks will be done for 3 sessions at entry of program.   Know Your Numbers and Heart Failure: - Group verbal and visual instruction to discuss disease risk factors for cardiac and pulmonary disease and treatment options.  Reviews associated critical values for Overweight/Obesity, Hypertension, Cholesterol, and Diabetes.  Discusses basics of heart failure: signs/symptoms and treatments.  Introduces Heart Failure Zone chart for action plan for heart failure.  Written material given at graduation. Flowsheet Row Pulmonary Rehab from 01/27/2023 in [facility] Cardiac and Pulmonary Rehab  Date 01/27/23  Educator [facility]  Instruction Review Code 1- Verbalizes Understanding       Core Components/Risk Factors/Patient Goals Review:   Goals and Risk Factor Review     Row Name 11/23/22 1554 12/10/22 1602 03/29/23 1541         Core Components/Risk  Factors/Patient Goals Review   Personal Goals Review Weight Management/Obesity;Improve shortness of breath with ADL's;Increase knowledge of respiratory medications and ability to use respiratory devices properly.;Hypertension;Heart Failure Improve shortness of breath with ADL's Other     Review Joyce Evans is doing well in rehab.  She is up a little to 132 lb from being at beach last two weeks.  She would like to get it back down to her 129 lb that she is normally at.  She is working on her breathing and does get short of breath with activity and her saturations will drop, especially when she forgets to breathe. She is doing well in her meds and uses her inhalers.  She will also do her breathing exercises at night to help.  She checks her pressures at home and they have been doing well. She does not have any heart failure symptoms currently and feels pretty good overall. Spoke to patient about their shortness of breath and what they can do to improve. Patient has been informed of breathing techniques when starting the program. Patient is informed to tell staff if they have had any med changes and that certain meds they are taking or not taking can be causing shortness of breath. Patient worked on PLB while Joyce Evans training. Performed certain weight routine to help with the accessory muscles of breathing such as chest, traps, intercostals and abdomen. Used PLB while working out and at rest to improve oxygen and decrease Heart rate. Patient was also taught how to perform diaphragmatic breathing. Performed ten breaths of diaphragmatic breathing. Informed patient how to use pulse oximeter, what to look for and how to troubleshoot oximeter.  Expected Outcomes short: Continue to work on getting vacation weight back down Long: conitnue to work on breathing Short: Attend [name] regularly to improve shortness of breath with ADL's. Long: maintain independence with ADL's Short: Continue to practice breathing techniques while  working out and at home. Long: continue to master PLB while using breathing techniques while working out independently.              Core Components/Risk Factors/Patient Goals at Discharge (Final Review):   Goals and Risk Factor Review - 03/29/23 1541       Core Components/Risk Factors/Patient Goals Review   Personal Goals Review Other    Review Patient worked on PLB while weight training. Performed certain weight routine to help with the accessory muscles of breathing such as chest, traps, intercostals and abdomen. Used PLB while working out and at rest to improve oxygen and decrease Heart rate. Patient was also taught how to perform diaphragmatic breathing. Performed ten breaths of diaphragmatic breathing. Informed patient how to use pulse oximeter, what to look for and how to troubleshoot oximeter.    Expected Outcomes Short: Continue to practice breathing techniques while working out and at home. Long: continue to master PLB while using breathing techniques while working out independently.             ITP Comments:  ITP Comments     Row Name 10/28/22 1348 11/02/22 1725 11/04/22 1411 11/25/22 0829 12/23/22 0918   ITP Comments Virtual Visit completed. Patient informed on EP and RD appointment and 6 Minute walk test. Patient also informed of patient health questionnaires on My Chart. Patient Verbalizes understanding. Visit diagnosis can be found in [facility] 09/10/2022. Completed and gym orientation. Initial ITP created and sent for review to Dr. Jinny Sanders, Medical Director. First full day of exercise!  Patient was oriented to gym and equipment including functions, settings, policies, and procedures.  Patient's individual exercise prescription and treatment plan were reviewed.  All starting workloads were established based on the results of the 6 minute walk test done at initial orientation visit.  The plan for exercise progression was also introduced and progression will be customized  based on patient's performance and goals. 30 Day review completed. Medical Director ITP review done, changes made as directed, and signed approval by Medical Director.   new to program 30 Day review completed. Medical Director ITP review done, changes made as directed, and signed approval by Medical Director.    Row Name 01/19/23 1418 02/17/23 1135 03/17/23 1004 04/07/23 1540     ITP Comments 30 Day review completed. Medical Director ITP review done, changes made as directed, and signed approval by Medical Director. 30 Day review completed. Medical Director ITP review done, changes made as directed, and signed approval by Medical Director. 30 Day review completed. Medical Director ITP review done, changes made as directed, and signed approval by Medical Director. Joyce Evans graduated today from  rehab with 36 sessions completed.  Details of the patient's exercise prescription and what She needs to do in order to continue the prescription and progress were discussed with patient.  Patient was given a copy of prescription and goals.  Patient verbalized understanding. Joyce Evans plans to continue to exercise by attending the [facility] program.             Comments: Discharge ITP

## 2023-04-08 ENCOUNTER — Ambulatory Visit: Payer: Medicare PPO

## 2023-04-20 ENCOUNTER — Encounter: Payer: Self-pay | Admitting: Dermatology

## 2023-04-20 ENCOUNTER — Ambulatory Visit: Payer: Medicare PPO | Admitting: Dermatology

## 2023-04-20 VITALS — BP 136/80 | HR 86

## 2023-04-20 DIAGNOSIS — L570 Actinic keratosis: Secondary | ICD-10-CM

## 2023-04-20 DIAGNOSIS — L578 Other skin changes due to chronic exposure to nonionizing radiation: Secondary | ICD-10-CM | POA: Diagnosis not present

## 2023-04-20 DIAGNOSIS — W908XXA Exposure to other nonionizing radiation, initial encounter: Secondary | ICD-10-CM | POA: Diagnosis not present

## 2023-04-20 DIAGNOSIS — L820 Inflamed seborrheic keratosis: Secondary | ICD-10-CM

## 2023-04-20 DIAGNOSIS — Z85828 Personal history of other malignant neoplasm of skin: Secondary | ICD-10-CM

## 2023-04-20 DIAGNOSIS — L821 Other seborrheic keratosis: Secondary | ICD-10-CM | POA: Diagnosis not present

## 2023-04-20 DIAGNOSIS — D692 Other nonthrombocytopenic purpura: Secondary | ICD-10-CM | POA: Diagnosis not present

## 2023-04-20 NOTE — Patient Instructions (Addendum)
Seborrheic Keratosis  What causes seborrheic keratoses? Seborrheic keratoses are harmless, common skin growths that first appear during adult life.  As time goes by, more growths appear.  Some people may develop a large number of them.  Seborrheic keratoses appear on both covered and uncovered body parts.  They are not caused by sunlight.  The tendency to develop seborrheic keratoses can be inherited.  They vary in color from skin-colored to gray, brown, or even black.  They can be either smooth or have a rough, warty surface.   Seborrheic keratoses are superficial and look as if they were stuck on the skin.  Under the microscope this type of keratosis looks like layers upon layers of skin.  That is why at times the top layer may seem to fall off, but the rest of the growth remains and re-grows.    Treatment Seborrheic keratoses do not need to be treated, but can easily be removed in the office.  Seborrheic keratoses often cause symptoms when they rub on clothing or jewelry.  Lesions can be in the way of shaving.  If they become inflamed, they can cause itching, soreness, or burning.  Removal of a seborrheic keratosis can be accomplished by freezing, burning, or surgery. If any spot bleeds, scabs, or grows rapidly, please return to have it checked, as these can be an indication of a skin cancer.  Cryotherapy Aftercare  Wash gently with soap and water everyday.   Apply Vaseline and Band-Aid daily until healed.   Actinic keratoses are precancerous spots that appear secondary to cumulative UV radiation exposure/sun exposure over time. They are chronic with expected duration over 1 year. A portion of actinic keratoses will progress to squamous cell carcinoma of the skin. It is not possible to reliably predict which spots will progress to skin cancer and so treatment is recommended to prevent development of skin cancer.  Recommend daily broad spectrum sunscreen SPF 30+ to sun-exposed areas, reapply every  2 hours as needed.  Recommend staying in the shade or wearing long sleeves, sun glasses (UVA+UVB protection) and wide brim hats (4-inch brim around the entire circumference of the hat). Call for new or changing lesions.      Due to recent changes in healthcare laws, you may see results of your pathology and/or laboratory studies on MyChart before the doctors have had a chance to review them. We understand that in some cases there may be results that are confusing or concerning to you. Please understand that not all results are received at the same time and often the doctors may need to interpret multiple results in order to provide you with the best plan of care or course of treatment. Therefore, we ask that you please give [place] 2 business days to thoroughly review all your results before contacting the office for clarification. Should we see a critical lab result, you will be contacted sooner.   If You Need Anything After Your Visit  If you have any questions or concerns for your doctor, please call our main line at [phone] and press option 4 to reach your doctor's medical assistant. If no one answers, please leave a voicemail as directed and we will return your call as soon as possible. Messages left after 4 pm will be answered the following business day.   You may also send [place] a message via MyChart. We typically respond to MyChart messages within 1-2 business days.  For prescription refills, please ask your pharmacy to contact our office. Our  fax number is [phone].  If you have an urgent issue when the clinic is closed that cannot wait until the next business day, you can page your doctor at the number below.    Please note that while we do our best to be available for urgent issues outside of office hours, we are not available 24/7.   If you have an urgent issue and are unable to reach [place], you may choose to seek medical care at your doctor's office, retail clinic, urgent care  center, or emergency room.  If you have a medical emergency, please immediately call 911 or go to the emergency department.  Pager Numbers  - Dr. Gwen Pounds: [phone]  - Dr. Roseanne Reno: [phone]  - Dr. Katrinka Blazing: [phone]   In the event of inclement weather, please call our main line at [phone] for an update on the status of any delays or closures.  Dermatology Medication Tips: Please keep the boxes that topical medications come in in order to help keep track of the instructions about where and how to use these. Pharmacies typically print the medication instructions only on the boxes and not directly on the medication tubes.   If your medication is too expensive, please contact our office at [phone] option 4 or send [place] a message through MyChart.   We are unable to tell what your co-pay for medications will be in advance as this is different depending on your insurance coverage. However, we may be able to find a substitute medication at lower cost or fill out paperwork to get insurance to cover a needed medication.   If a prior authorization is required to get your medication covered by your insurance company, please allow [place] 1-2 business days to complete this process.  Drug prices often vary depending on where the prescription is filled and some pharmacies may offer cheaper prices.  The website www.goodrx.com contains coupons for medications through different pharmacies. The prices here do not account for what the cost may be with help from insurance (it may be cheaper with your insurance), but the website can give you the price if you did not use any insurance.  - You can print the associated coupon and take it with your prescription to the pharmacy.  - You may also stop by our office during regular business hours and pick up a GoodRx coupon card.  - If you need your prescription sent electronically to a different pharmacy, notify our office through [facility] or by  phone at [phone] option 4.     Si Usted Necesita Algo Despus de Su Visita  Tambin puede enviarnos un mensaje a travs de Clinical cytogeneticist. Por lo general respondemos a los mensajes de MyChart en el transcurso de 1 a 2 das hbiles.  Para renovar recetas, por favor pida a su farmacia que se ponga en contacto con nuestra oficina. Annie Sable de fax es [place] [phone].  Si tiene un asunto urgente cuando la clnica est cerrada y que no puede esperar hasta el siguiente da hbil, puede llamar/localizar a su doctor(a) al nmero que aparece a continuacin.   Por favor, tenga en cuenta que aunque hacemos todo lo posible para estar disponibles para asuntos urgentes fuera del horario de [place], no estamos disponibles las 24 horas del da, los 7 [address]  [address] de la [place].   Si tiene un problema urgente y no puede comunicarse con nosotros, puede optar por buscar atencin mdica  en el consultorio de su doctor(a), en una clnica privada, en un  centro de atencin urgente o en una sala de emergencias.  Si tiene Engineer, drilling, por favor llame inmediatamente al 911 o vaya a la sala de emergencias.  Nmeros de bper  - Dr. Gwen Pounds: [phone]  - Dra. Roseanne Reno: [phone]  - Dr. Katrinka Blazing: [phone]   En caso de inclemencias del tiempo, por favor llame a Lacy Duverney principal al [phone] para una actualizacin sobre el [place] de cualquier retraso o cierre.  Consejos para la medicacin en dermatologa: Por favor, guarde las cajas en las que vienen los medicamentos de uso tpico para ayudarle a seguir las instrucciones sobre dnde y cmo usarlos. Las farmacias generalmente imprimen las instrucciones del medicamento slo en las cajas y no directamente en los tubos del [place].   Si su medicamento es muy caro, por favor, pngase en contacto con Rolm Gala llamando al [phone] y presione la opcin 4 o envenos un mensaje a travs de Clinical cytogeneticist.   No podemos decirle cul ser su copago  por los medicamentos por adelantado ya que esto es diferente dependiendo de la cobertura de su seguro. Sin embargo, es posible que podamos encontrar un medicamento sustituto a Audiological scientist un formulario para que el seguro cubra el medicamento que se considera necesario.   Si se requiere una autorizacin previa para que su compaa de seguros [place] su medicamento, por favor permtanos de 1 a 2 das hbiles para completar [address]  Los precios de los medicamentos varan con frecuencia dependiendo del Environmental consultant de dnde se surte la receta y alguna farmacias pueden ofrecer precios ms baratos.  El sitio web www.goodrx.com tiene cupones para medicamentos de Health and safety inspector. Los precios aqu no tienen en cuenta lo que podra costar con la ayuda del seguro (puede ser ms barato con su seguro), pero el sitio web puede darle el precio si no utiliz Tourist information centre manager.  - Puede imprimir el cupn correspondiente y llevarlo con su receta a la farmacia.  - Tambin puede pasar por nuestra oficina durante el horario de atencin regular y Education officer, museum una tarjeta de cupones de GoodRx.  - Si necesita que su receta se enve electrnicamente a una farmacia diferente, informe a nuestra oficina a travs de MyChart de [place] o por telfono llamando al [phone] y presione la opcin 4.

## 2023-04-20 NOTE — Progress Notes (Signed)
Follow-Up Visit   Subjective  Joyce Evans is a 82 y.o. female who presents for the following: 1 year ak follow up, patient just finished radiation to treat cancer at nose also mentioned faint red mark at right side of nose.  The patient has spots, moles and lesions to be evaluated, some may be new or changing and the patient may have concern these could be cancer.   The following portions of the chart were reviewed this encounter and updated as appropriate: medications, allergies, medical history  Review of Systems:  No other skin or systemic complaints except as noted in HPI or Assessment and Plan.  Objective  Well appearing patient in no apparent distress; mood and affect are within normal limits.  A focused examination was performed of the following areas: Nose, face, arms, hands  Relevant exam findings are noted in the Assessment and Plan.  face x 3, right hand x 1 (4) Erythematous thin papules/macules with gritty scale.   face x 2, right hand x 1 (3) Erythematous stuck-on, waxy papule or plaque    Assessment & Plan   SEBORRHEIC KERATOSIS - Stuck-on, waxy, tan-brown papules and/or plaques  - Benign-appearing - Discussed benign etiology and prognosis. - Observe - Call for any changes  Purpura - Chronic; persistent and recurrent.  Treatable, but not curable. - Violaceous macules and patches - Benign - Related to trauma, age, sun damage and/or use of blood thinners, chronic use of topical and/or oral steroids - Observe - Can use OTC arnica containing moisturizer such as Dermend Bruise Formula if desired - Call for worsening or other concerns   ACTINIC DAMAGE - chronic, secondary to cumulative UV radiation exposure/sun exposure over time - diffuse scaly erythematous macules with underlying dyspigmentation - Recommend daily broad spectrum sunscreen SPF 30+ to sun-exposed areas, reapply every 2 hours as needed.  - Recommend staying in the shade or wearing long  sleeves, sun glasses (UVA+UVB protection) and wide brim hats (4-inch brim around the entire circumference of the hat). - Call for new or changing lesions.  Actinic keratosis (4) face x 3, right hand x 1  Actinic keratoses are precancerous spots that appear secondary to cumulative UV radiation exposure/sun exposure over time. They are chronic with expected duration over 1 year. A portion of actinic keratoses will progress to squamous cell carcinoma of the skin. It is not possible to reliably predict which spots will progress to skin cancer and so treatment is recommended to prevent development of skin cancer.  Recommend daily broad spectrum sunscreen SPF 30+ to sun-exposed areas, reapply every 2 hours as needed.  Recommend staying in the shade or wearing long sleeves, sun glasses (UVA+UVB protection) and wide brim hats (4-inch brim around the entire circumference of the hat). Call for new or changing lesions.  Destruction of lesion - face x 3, right hand x 1 (4) Complexity: simple   Destruction method: cryotherapy   Informed consent: discussed and consent obtained   Timeout:  patient name, date of birth, surgical site, and procedure verified Lesion destroyed using liquid nitrogen: Yes   Region frozen until ice ball extended beyond lesion: Yes   Outcome: patient tolerated procedure well with no complications   Post-procedure details: wound care instructions given    Inflamed seborrheic keratosis (3) face x 2, right hand x 1  Symptomatic, irritating, patient would like treated.  Destruction of lesion - face x 2, right hand x 1 (3) Complexity: simple   Destruction method: cryotherapy  Informed consent: discussed and consent obtained   Timeout:  patient name, date of birth, surgical site, and procedure verified Lesion destroyed using liquid nitrogen: Yes   Region frozen until ice ball extended beyond lesion: Yes   Outcome: patient tolerated procedure well with no complications    Post-procedure details: wound care instructions given    HISTORY OF BASAL CELL CARCINOMA OF THE SKIN  Dorsum nose supratip treated with radiation  - No evidence of recurrence today - Recommend regular full body skin exams - Recommend daily broad spectrum sunscreen SPF 30+ to sun-exposed areas, reapply every 2 hours as needed.  - Call if any new or changing lesions are noted between office visits   Return for 6 - 8 month tbse .  IAsher Muir, CMA, am acting as scribe for Armida Sans, MD.   Documentation: I have reviewed the above documentation for accuracy and completeness, and I agree with the above.  Armida Sans, MD

## 2023-05-04 ENCOUNTER — Other Ambulatory Visit: Payer: Self-pay | Admitting: *Deleted

## 2023-05-04 DIAGNOSIS — Z923 Personal history of irradiation: Secondary | ICD-10-CM

## 2023-05-04 DIAGNOSIS — C44311 Basal cell carcinoma of skin of nose: Secondary | ICD-10-CM

## 2023-05-11 ENCOUNTER — Encounter: Payer: Medicare PPO | Attending: Radiation Oncology

## 2023-05-11 VITALS — Ht 61.4 in | Wt 131.4 lb

## 2023-05-11 DIAGNOSIS — C44311 Basal cell carcinoma of skin of nose: Secondary | ICD-10-CM | POA: Insufficient documentation

## 2023-05-11 NOTE — Progress Notes (Signed)
Daily Session Note  Patient Details  Name: Joyce Evans MRN: [id number] Date of Birth: 08-Aug-1940 Referring Provider:   Doristine Devoid [facility] Rehabilitation & Exercise from 05/11/2023 in [facility]  Referring Provider Carmina Miller, MD       Encounter Date: 05/11/2023  Check In:  Session Check In - 05/11/23 1416       Check-In   Supervising physician immediately available to respond to emergencies See telemetry face sheet for immediately available ER MD    Location [facility]-Cardiac & Pulmonary Rehab    Staff Present Rory Percy, MS, Exercise Physiologist;Noah Tickle, BS, Exercise Physiologist    Virtual Visit No    Medication changes reported     No    Fall or balance concerns reported    No    Warm-up and Cool-down Performed on first and last piece of equipment    Resistance Training Performed Yes    VAD Patient? No    PAD/SET Patient? No      Pain Assessment   Currently in Pain? No/denies    Multiple Pain Sites No               Exercise Prescription Changes - 05/11/23 1400       Response to Exercise   Blood Pressure (Admit) 110/64    Blood Pressure (Exercise) 134/78    Blood Pressure (Exit) 102/60    Heart Rate (Admit) 69 bpm    Heart Rate (Exercise) 112 bpm    Heart Rate (Exit) 69 bpm    Oxygen Saturation (Admit) 93 %    Oxygen Saturation (Exercise) 84 %    Oxygen Saturation (Exit) 96 %    Rating of Perceived Exertion (Exercise) 15    Perceived Dyspnea (Exercise) 3    Symptoms SOB    Comments results and 1st day    Duration Continue with 30 min of aerobic exercise without signs/symptoms of physical distress.    Intensity THRR unchanged      Progression   Progression Continue to progress workloads to maintain intensity without signs/symptoms of physical distress.    Average METs 1.54             Social History   Tobacco Use  Smoking Status Never  Smokeless Tobacco Never    Goals Met:  Independence  with exercise equipment Exercise tolerated well No report of concerns or symptoms today  Goals Unmet:  Not Applicable  Comments:  First full day of exercise!  Patient was oriented to gym and equipment including functions, settings, policies, and procedures.  Patient's individual exercise prescription and treatment plan were reviewed.  All starting workloads were established based on the results of the 6 minute walk test done at initial orientation visit.  The plan for exercise progression was also introduced and progression will be customized based on patient's performance and goals.    6 Minute Walk     Row Name 03/17/23 1602 05/11/23 1418       6 Minute Walk   Phase Discharge Initial    Distance 780 feet 730 feet    Distance % Change 4 % --    Distance Feet Change 30 ft --    Walk Time 6 minutes 5.5 minutes    # of Rest Breaks 0 1    MPH 1.48 1.51    METS 2.12 1.54    RPE 14 15    Perceived Dyspnea  3 3    VO2 Peak 7.43 5.38  Symptoms Yes (comment) Yes (comment)    Comments SOB SOB    Resting HR 84 bpm 69 bpm    Resting BP 102/64 110/64    Resting Oxygen Saturation  97 % 93 %    Exercise Oxygen Saturation  during 6 min walk 85 % 84 %    Max Ex. HR 109 bpm 112 bpm    Max Ex. BP 154/82 134/78    2 Minute Post BP 128/68 130/70      Interval HR   1 Minute HR 77 --    2 Minute HR 92 --    3 Minute HR 109 --    4 Minute HR 106 --    5 Minute HR 97 --    6 Minute HR 95 --    2 Minute Post HR 92 --    Interval Heart Rate? Yes --      Interval Oxygen   Interval Oxygen? Yes --    Baseline Oxygen Saturation % 97 % --    1 Minute Oxygen Saturation % 91 % --    1 Minute Liters of Oxygen 4 L --    2 Minute Oxygen Saturation % 85 % --    2 Minute Liters of Oxygen 4 L --    3 Minute Oxygen Saturation % 88 % --    3 Minute Liters of Oxygen 4 L --    4 Minute Oxygen Saturation % 90 % --    4 Minute Liters of Oxygen 4 L --    5 Minute Oxygen Saturation % 86 % --    5 Minute  Liters of Oxygen 4 L --    6 Minute Oxygen Saturation % 86 % --    6 Minute Liters of Oxygen 4 L --    2 Minute Post Oxygen Saturation % 98 % --    2 Minute Post Liters of Oxygen 4 L --              Dr. Bethann Punches is Medical Director for [facility].  Dr. Vida Rigger is Medical Director for [facility] Pulmonary Rehabilitation.

## 2023-05-11 NOTE — Patient Instructions (Signed)
Patient Instructions  Patient Details  Name: Joyce Evans MRN: [id number] Date of Birth: [birth year] Referring Provider:  Carmina Miller, MD  Below are your personal goals for exercise, nutrition, and risk factors. Our goal is to help you stay on track towards obtaining and maintaining these goals. We will be discussing your progress on these goals with you throughout the program.  Initial Exercise Prescription:  Initial Exercise Prescription - 05/11/23 1400       Date of Initial Exercise RX and Referring Provider   Date 05/11/23    Referring Provider Carmina Miller, MD      Oxygen   Oxygen Continuous    Liters 4    Maintain Oxygen Saturation 88% or higher      Recumbant Bike   Level 2    RPM 50    Watts 15    Minutes 15    METs 1.54      NuStep   Level 4    SPM 80    Minutes 15    METs 1.54      REL-XR   Level 1    Speed 50    Minutes 15    METs 1.54      Track   Laps 10    Minutes 15    METs 1.54      Prescription Details   Frequency (times per week) 2    Duration Progress to 30 minutes of continuous aerobic without signs/symptoms of physical distress      Intensity   THRR 40-80% of Max Heartrate 97-125    Ratings of Perceived Exertion 11-13    Perceived Dyspnea 0-4      Progression   Progression Continue to progress workloads to maintain intensity without signs/symptoms of physical distress.      Resistance Training   Training Prescription Yes    Weight 3 lb    Reps 10-15             Exercise Goals: Frequency: Be able to perform aerobic exercise two to three times per week in program working toward 2-5 days per week of home exercise.  Intensity: Work with a perceived exertion of 11 (fairly light) - 15 (hard) while following your exercise prescription.  We will make changes to your prescription with you as you progress through the program.   Duration: Be able to do 30 to 45 minutes of continuous aerobic exercise in addition to a 5 minute  warm-up and a 5 minute cool-down routine.   Nutrition Goals: Your personal nutrition goals will be established when you do your nutrition analysis with the dietician.  The following are general nutrition guidelines to follow: Cholesterol < 200mg /day Sodium < 1500mg /day Fiber: Women over 50 yrs - 21 grams per day  Exercise Goals and Review:  Exercise Goals     Row Name 05/11/23 1432             Exercise Goals   Increase Physical Activity Yes       Intervention Provide advice, education, support and counseling about physical activity/exercise needs.;Develop an individualized exercise prescription for aerobic and resistive training based on initial evaluation findings, risk stratification, comorbidities and participant's personal goals.       Expected Outcomes Short Term: Attend rehab on a regular basis to increase amount of physical activity.;Long Term: Add in home exercise to make exercise part of routine and to increase amount of physical activity.;Long Term: Exercising regularly at least 3-5 days a week.  Increase Strength and Stamina Yes       Intervention Provide advice, education, support and counseling about physical activity/exercise needs.;Develop an individualized exercise prescription for aerobic and resistive training based on initial evaluation findings, risk stratification, comorbidities and participant's personal goals.       Expected Outcomes Short Term: Increase workloads from initial exercise prescription for resistance, speed, and METs.;Short Term: Perform resistance training exercises routinely during rehab and add in resistance training at home;Long Term: Improve cardiorespiratory fitness, muscular endurance and strength as measured by increased METs and functional capacity ( )       Able to understand and use rate of perceived exertion (RPE) scale Yes       Intervention Provide education and explanation on how to use RPE scale       Expected Outcomes Short  Term: Able to use RPE daily in rehab to express subjective intensity level;Long Term:  Able to use RPE to guide intensity level when exercising independently       Able to understand and use Dyspnea scale Yes       Intervention Provide education and explanation on how to use Dyspnea scale       Expected Outcomes Short Term: Able to use Dyspnea scale daily in rehab to express subjective sense of shortness of breath during exertion;Long Term: Able to use Dyspnea scale to guide intensity level when exercising independently       Knowledge and understanding of Target Heart Rate Range (THRR) Yes       Intervention Provide education and explanation of THRR including how the numbers were predicted and where they are located for reference       Expected Outcomes Short Term: Able to state/look up THRR;Long Term: Able to use THRR to govern intensity when exercising independently;Short Term: Able to use daily as guideline for intensity in rehab       Able to check pulse independently Yes       Intervention Provide education and demonstration on how to check pulse in carotid and radial arteries.;Review the importance of being able to check your own pulse for safety during independent exercise       Expected Outcomes Short Term: Able to explain why pulse checking is important during independent exercise       Understanding of Exercise Prescription Yes       Intervention Provide education, explanation, and written materials on patient's individual exercise prescription       Expected Outcomes Short Term: Able to explain program exercise prescription;Long Term: Able to explain home exercise prescription to exercise independently

## 2023-05-13 ENCOUNTER — Ambulatory Visit
Admission: RE | Admit: 2023-05-13 | Discharge: 2023-05-13 | Disposition: A | Discharge status: Home / Self Care | Payer: Medicare PPO | Source: Ambulatory Visit | Attending: Radiation Oncology | Admitting: Radiation Oncology

## 2023-05-13 VITALS — BP 110/70 | HR 76 | Temp 98.0°F | Ht 61.4 in | Wt 129.1 lb

## 2023-05-13 DIAGNOSIS — C44310 Basal cell carcinoma of skin of unspecified parts of face: Secondary | ICD-10-CM | POA: Insufficient documentation

## 2023-05-13 NOTE — Progress Notes (Signed)
Radiation Oncology Follow up Note  Name: Joyce Evans   Date:   05/13/2023 MRN:  [id number] DOB: 07-25-1940    This 82 y.o. 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  She is on Eliquis her clotting levels have not been checked in a while of asked her to arrange that..  COMPLICATIONS OF TREATMENT: none  FOLLOW UP COMPLIANCE: keeps appointments   PHYSICAL EXAM:  There were no vitals taken for this visit. Some slight scarring on the nose tip no evidence of viable tumor.  No evidence of adenopathy in the submental or cervical chain.  Cosmetic result is excellent.  RADIOLOGY RESULTS: No current films for review  PLAN: Present time patient is doing well with no evidence of disease.  She will check with her doctors at [facility] about her Eliquis and her nosebleeds.  She may need some adjustment in her medication dose.  I will see her back 1 more time in 6 months and then turn follow-up care over to dermatology.  Patient knows to call with any concerns.    Carmina Miller, MD

## 2023-05-18 ENCOUNTER — Encounter: Payer: Medicare PPO | Attending: Radiation Oncology

## 2023-05-18 DIAGNOSIS — C44311 Basal cell carcinoma of skin of nose: Secondary | ICD-10-CM | POA: Insufficient documentation

## 2023-05-18 NOTE — Progress Notes (Signed)
Daily Session Note  Patient Details  Name: Joyce Evans MRN: [id number] Date of Birth: September 15, 1940 Referring Provider:   Doristine Devoid Cancer Associated Rehabilitation & Exercise from 05/11/2023 in [facility] Cardiac and Pulmonary Rehab  Referring Provider Carmina Miller, MD       Encounter Date: 05/18/2023  Check In:  Session Check In - 05/18/23 1223       Check-In   Supervising physician immediately available to respond to emergencies See telemetry face sheet for immediately available ER MD    Location [facility]-Cardiac & Pulmonary Rehab    Staff Present Rory Percy, MS, Exercise Physiologist;Caitlin Ainley, BS, Exercise Physiologist    Virtual Visit No    Medication changes reported     No    Fall or balance concerns reported    No    Warm-up and Cool-down Performed on first and last piece of equipment    Resistance Training Performed Yes    VAD Patient? No    PAD/SET Patient? No      Pain Assessment   Currently in Pain? No/denies    Multiple Pain Sites No                Social History   Tobacco Use  Smoking Status Never  Smokeless Tobacco Never    Goals Met:  Independence with exercise equipment Exercise tolerated well No report of concerns or symptoms today  Goals Unmet:  Not Applicable  Comments: Pt able to follow exercise prescription today without complaint.  Will continue to monitor for progression.    Dr. Bethann Punches is Medical Director for [facility] Cardiac Rehabilitation.  Dr. Vida Rigger is Medical Director for [facility] Pulmonary Rehabilitation.

## 2023-05-25 DIAGNOSIS — C44311 Basal cell carcinoma of skin of nose: Secondary | ICD-10-CM

## 2023-05-25 NOTE — Progress Notes (Signed)
Daily Session Note  Patient Details  Name: Joyce Evans MRN: [id number] Date of Birth: Nov 14, 1940 Referring Provider:   Doristine Devoid Cancer Associated Rehabilitation & Exercise from 05/11/2023 in [facility] Cardiac and Pulmonary Rehab  Referring Provider Carmina Miller, MD       Encounter Date: 05/25/2023  Check In:  Session Check In - 05/25/23 1228       Check-In   Supervising physician immediately available to respond to emergencies See telemetry face sheet for immediately available ER MD    Location [facility]-Cardiac & Pulmonary Rehab    Staff Present Rory Percy, MS, Exercise Physiologist;Rea Reser, BS, Exercise Physiologist    Virtual Visit No    Medication changes reported     No    Fall or balance concerns reported    No    Warm-up and Cool-down Performed on first and last piece of equipment    Resistance Training Performed Yes    VAD Patient? No    PAD/SET Patient? No      Pain Assessment   Currently in Pain? No/denies    Multiple Pain Sites No                Social History   Tobacco Use  Smoking Status Never  Smokeless Tobacco Never    Goals Met:  Independence with exercise equipment Exercise tolerated well No report of concerns or symptoms today  Goals Unmet:  Not Applicable  Comments: Pt able to follow exercise prescription today without complaint.  Will continue to monitor for progression.    Dr. Bethann Punches is Medical Director for [facility] Cardiac Rehabilitation.  Dr. Vida Rigger is Medical Director for [facility] Pulmonary Rehabilitation.

## 2023-06-03 DIAGNOSIS — C44311 Basal cell carcinoma of skin of nose: Secondary | ICD-10-CM

## 2023-06-03 NOTE — Progress Notes (Signed)
Daily Session Note  Patient Details  Name: Joyce Evans MRN: [id number] Date of Birth: March 15, 1941 Referring Provider:   Doristine Devoid Cancer Associated Rehabilitation & Exercise from 05/11/2023 in [facility] Cardiac and Pulmonary Rehab  Referring Provider Carmina Miller, MD       Encounter Date: 06/03/2023  Check In:  Session Check In - 06/03/23 1613       Check-In   Supervising physician immediately available to respond to emergencies See telemetry face sheet for immediately available ER MD    Location [facility]-Cardiac & Pulmonary Rehab    Staff Present Rory Percy, MS, Exercise Physiologist;Maxon Conetta BS, , Exercise Physiologist    Virtual Visit No    Medication changes reported     No    Fall or balance concerns reported    No    Warm-up and Cool-down Performed on first and last piece of equipment    Resistance Training Performed Yes    VAD Patient? No    PAD/SET Patient? No      Pain Assessment   Currently in Pain? No/denies    Multiple Pain Sites No                Social History   Tobacco Use  Smoking Status Never  Smokeless Tobacco Never    Goals Met:  Independence with exercise equipment Exercise tolerated well No report of concerns or symptoms today  Goals Unmet:  Not Applicable  Comments: Pt able to follow exercise prescription today without complaint.  Will continue to monitor for progression.    Dr. Bethann Punches is Medical Director for [facility] Cardiac Rehabilitation.  Dr. Vida Rigger is Medical Director for [facility] Pulmonary Rehabilitation.

## 2023-06-22 ENCOUNTER — Encounter: Payer: Medicare PPO | Attending: Radiation Oncology

## 2023-06-22 DIAGNOSIS — C44311 Basal cell carcinoma of skin of nose: Secondary | ICD-10-CM | POA: Insufficient documentation

## 2023-06-22 NOTE — Progress Notes (Signed)
Daily Session Note  Patient Details  Name: Joyce Evans MRN: [id number] Date of Birth: 04/21/1941 Referring Provider:   Doristine Devoid Cancer Associated Rehabilitation & Exercise from 05/11/2023 in [facility] Cardiac and Pulmonary Rehab  Referring Provider Carmina Miller, MD       Encounter Date: 06/22/2023  Check In:  Session Check In - 06/22/23 1236       Check-In   Supervising physician immediately available to respond to emergencies See telemetry face sheet for immediately available ER MD    Location [facility]-Cardiac & Pulmonary Rehab    Staff Present Rory Percy, MS, Exercise Physiologist;Vy Badley, BS, Exercise Physiologist    Virtual Visit No    Medication changes reported     Yes    Comments taking prednisone for viral infection    Fall or balance concerns reported    No    Warm-up and Cool-down Performed on first and last piece of equipment    Resistance Training Performed Yes    VAD Patient? No    PAD/SET Patient? No      Pain Assessment   Currently in Pain? No/denies    Multiple Pain Sites No                Social History   Tobacco Use  Smoking Status Never  Smokeless Tobacco Never    Goals Met:  Independence with exercise equipment Exercise tolerated well No report of concerns or symptoms today  Goals Unmet:  Not Applicable  Comments: Pt able to follow exercise prescription today without complaint.  Will continue to monitor for progression.    Dr. Bethann Punches is Medical Director for [facility] Cardiac Rehabilitation.  Dr. Vida Rigger is Medical Director for [facility] Pulmonary Rehabilitation.

## 2023-06-22 NOTE — Progress Notes (Deleted)
Daily Session Note  Patient Details  Name: Joyce Evans MRN: [id number] Date of Birth: 1940/10/05 Referring Provider:   Doristine Devoid Cancer Associated Rehabilitation & Exercise from 05/11/2023 in [facility] Cardiac and Pulmonary Rehab  Referring Provider Carmina Miller, MD       Encounter Date: 06/22/2023  Check In:  Session Check In - 06/22/23 1236       Check-In   Supervising physician immediately available to respond to emergencies See telemetry face sheet for immediately available ER MD    Location [facility]-Cardiac & Pulmonary Rehab    Staff Present Rory Percy, MS, Exercise Physiologist;Danee Soller, BS, Exercise Physiologist    Virtual Visit No    Medication changes reported     No    Fall or balance concerns reported    No    Warm-up and Cool-down Performed on first and last piece of equipment    Resistance Training Performed Yes    VAD Patient? No    PAD/SET Patient? No      Pain Assessment   Currently in Pain? No/denies    Multiple Pain Sites No                Social History   Tobacco Use  Smoking Status Never  Smokeless Tobacco Never    Goals Met:  Independence with exercise equipment Exercise tolerated well No report of concerns or symptoms today  Goals Unmet:  Not Applicable  Comments: Pt able to follow exercise prescription today without complaint.  Will continue to monitor for progression.    Dr. Bethann Punches is Medical Director for [facility] Cardiac Rehabilitation.  Dr. Vida Rigger is Medical Director for [facility] Pulmonary Rehabilitation.

## 2023-06-29 DIAGNOSIS — C44311 Basal cell carcinoma of skin of nose: Secondary | ICD-10-CM

## 2023-06-29 NOTE — Progress Notes (Signed)
Daily Session Note  Patient Details  Name: Joyce Evans MRN: [id number] Date of Birth: [birth year] Referring Provider:   Doristine Devoid Cancer Associated Rehabilitation & Exercise from 05/11/2023 in [facility] Cardiac and Pulmonary Rehab  Referring Provider Carmina Miller, MD       Encounter Date: 06/29/2023  Check In:  Session Check In - 06/29/23 1301       Check-In   Supervising physician immediately available to respond to emergencies See telemetry face sheet for immediately available ER MD    Location [facility]-Cardiac & Pulmonary Rehab    Staff Present Rory Percy, MS, Exercise Physiologist;Heyli Min, BS, Exercise Physiologist    Virtual Visit No    Medication changes reported     Yes    Comments no doxycycline    Fall or balance concerns reported    No    Tobacco Cessation No Change    Warm-up and Cool-down Performed on first and last piece of equipment    Resistance Training Performed Yes    VAD Patient? No    PAD/SET Patient? No      Pain Assessment   Currently in Pain? No/denies    Multiple Pain Sites No                Social History   Tobacco Use  Smoking Status Never  Smokeless Tobacco Never    Goals Met:  Independence with exercise equipment Exercise tolerated well No report of concerns or symptoms today  Goals Unmet:  Not Applicable  Comments: Pt able to follow exercise prescription today without complaint.  Will continue to monitor for progression.    Dr. Bethann Punches is Medical Director for [facility] Cardiac Rehabilitation.  Dr. Vida Rigger is Medical Director for [facility] Pulmonary Rehabilitation.

## 2023-07-13 DIAGNOSIS — C44311 Basal cell carcinoma of skin of nose: Secondary | ICD-10-CM

## 2023-07-13 NOTE — Progress Notes (Signed)
 Daily Session Note  Patient Details  Name: Joyce Evans MRN: [id number] Date of Birth: [birth year] Referring Provider:   Conrad Ports Cancer Associated Rehabilitation & Exercise from 05/11/2023 in [facility] Cardiac and Pulmonary Rehab  Referring Provider Lenn Aran, MD       Encounter Date: 07/13/2023  Check In:  Session Check In - 07/13/23 1228       Check-In   Supervising physician immediately available to respond to emergencies See telemetry face sheet for immediately available ER MD    Location [facility]-Cardiac & Pulmonary Rehab    Staff Present Rollene Paterson, MS, Exercise Physiologist;Rodricus Candelaria, BS, Exercise Physiologist    Virtual Visit No    Medication changes reported     Yes    Comments no more prednisone     Fall or balance concerns reported    No    Warm-up and Cool-down Performed on first and last piece of equipment    Resistance Training Performed Yes    VAD Patient? No    PAD/SET Patient? No      Pain Assessment   Currently in Pain? No/denies    Multiple Pain Sites No                Social History   Tobacco Use  Smoking Status Never  Smokeless Tobacco Never    Goals Met:  Independence with exercise equipment Exercise tolerated well No report of concerns or symptoms today  Goals Unmet:  Not Applicable  Comments: Pt able to follow exercise prescription today without complaint.  Will continue to monitor for progression.    Dr. Oneil Pinal is Medical Director for [facility] Cardiac Rehabilitation.  Dr. Fuad Aleskerov is Medical Director for [facility] Pulmonary Rehabilitation.

## 2023-07-28 ENCOUNTER — Telehealth: Payer: Self-pay | Admitting: *Deleted

## 2023-07-28 NOTE — Telephone Encounter (Signed)
 Patient called reporting tat her right nostril is not healing and is very sore. She is using saline solution and Vaseline on it and it will not heal. She is asking if she can come in to see Dr Jacalyn Martin to see what can be done. Please return her call

## 2023-07-28 NOTE — Telephone Encounter (Signed)
 Called patient regarding painful nostrils scheduled follow up appointment next week. Patient verbalized understanding.

## 2023-08-04 ENCOUNTER — Ambulatory Visit
Admission: RE | Admit: 2023-08-04 | Discharge: 2023-08-04 | Disposition: A | Discharge status: Home / Self Care | Payer: Medicare PPO | Source: Ambulatory Visit | Attending: Radiation Oncology | Admitting: Radiation Oncology

## 2023-08-04 ENCOUNTER — Encounter: Payer: Self-pay | Admitting: Radiation Oncology

## 2023-08-04 VITALS — BP 131/79 | HR 98 | Temp 98.6°F | Resp 20

## 2023-08-04 DIAGNOSIS — C44310 Basal cell carcinoma of skin of unspecified parts of face: Secondary | ICD-10-CM | POA: Diagnosis present

## 2023-08-04 DIAGNOSIS — Z923 Personal history of irradiation: Secondary | ICD-10-CM | POA: Insufficient documentation

## 2023-08-04 NOTE — Progress Notes (Signed)
Radiation Oncology Follow up Note  Name: Joyce Evans   Date:   08/04/2023 MRN:  [id number] DOB: 1940-12-23    This 83 y.o. female presents to the clinic today for 6-month ollow-up status post electron-beam therapy.  To her nose tip for a basal cell carcinoma  REFERRING PROVIDER: Kandyce Rud, MD  HPI: Patient is an 83 year old female now out 5 months having completed electron-beam therapy to her nose for basal cell carcinoma the nose tip.  She is complaining of some tenderness in her nose mostly in her nostrils with some occasional mucus.  She is on what appears to be constant nasal oxygen using nostril cannula.  I believe her issues stem from the constant use of the nasal cannula.  She is otherwise without complaint.  COMPLICATIONS OF TREATMENT: none  FOLLOW UP COMPLIANCE: keeps appointments   PHYSICAL EXAM:  BP 131/79   Pulse 98   Temp 98.6 F (37 C) (Tympanic)   Resp 20  No evidence of mass or nodularity in the nasal region is noted.  No evidence of submental or cervical adenopathy is identified.  Chest is clear  RADIOLOGY RESULTS: No current films to review  PLAN: Present time of asked the patient to contact her pulmonologist.  The nasal cannula seems to be the source of her nasal nostril inflammation.  This also will count from some of her other symptoms.  Explained to her electron-beam therapy really spared her nostrils and there is no evidence of disease at this point in time.  I am turning follow-up care over to dermatology.  Be happy to reevaluate the patient in time the future should that be indicated.  I would like to take this opportunity to thank you for allowing me to participate in the care of your patient.Carmina Miller, MD

## 2023-08-10 ENCOUNTER — Encounter: Payer: Medicare PPO | Attending: Radiation Oncology

## 2023-08-10 DIAGNOSIS — C44311 Basal cell carcinoma of skin of nose: Secondary | ICD-10-CM | POA: Insufficient documentation

## 2023-08-10 NOTE — Progress Notes (Signed)
Daily Session Note  Patient Details  Name: Joyce Evans MRN: [id number] Date of Birth: 1941/07/07 Referring Provider:   Doristine Devoid Cancer Associated Rehabilitation & Exercise from 05/11/2023 in [facility] Cardiac and Pulmonary Rehab  Referring Provider Carmina Miller, MD       Encounter Date: 08/10/2023  Check In:  Session Check In - 08/10/23 1254       Check-In   Supervising physician immediately available to respond to emergencies See telemetry face sheet for immediately available ER MD    Location [facility]-Cardiac & Pulmonary Rehab    Staff Present Rory Percy, MS, Exercise Physiologist;Noah Tickle, BS, Exercise Physiologist    Virtual Visit No    Medication changes reported     No    Comments Lasix as needed    Fall or balance concerns reported    No    Warm-up and Cool-down Performed on first and last piece of equipment    Resistance Training Performed Yes    VAD Patient? No    PAD/SET Patient? No      Pain Assessment   Currently in Pain? No/denies    Multiple Pain Sites No                Social History   Tobacco Use  Smoking Status Never  Smokeless Tobacco Never    Goals Met:  Independence with exercise equipment Exercise tolerated well No report of concerns or symptoms today  Goals Unmet:  Not Applicable  Comments: Pt able to follow exercise prescription today without complaint.  Will continue to monitor for progression.    Dr. Bethann Punches is Medical Director for [facility] Cardiac Rehabilitation.  Dr. Vida Rigger is Medical Director for [facility] Pulmonary Rehabilitation.

## 2023-08-17 ENCOUNTER — Encounter: Payer: Medicare PPO | Attending: Radiation Oncology

## 2023-08-17 ENCOUNTER — Other Ambulatory Visit: Payer: Self-pay | Admitting: Family Medicine

## 2023-08-17 DIAGNOSIS — C44311 Basal cell carcinoma of skin of nose: Secondary | ICD-10-CM | POA: Insufficient documentation

## 2023-08-17 DIAGNOSIS — Z1231 Encounter for screening mammogram for malignant neoplasm of breast: Secondary | ICD-10-CM

## 2023-08-17 NOTE — Progress Notes (Signed)
Daily Session Note  Patient Details  Name: Joyce Evans MRN: [id number] Date of Birth: 05-26-1941 Referring Provider:   Doristine Devoid Cancer Associated Rehabilitation & Exercise from 05/11/2023 in [facility] Cardiac and Pulmonary Rehab  Referring Provider Carmina Miller, MD       Encounter Date: 08/17/2023  Check In:  Session Check In - 08/17/23 1227       Check-In   Supervising physician immediately available to respond to emergencies See telemetry face sheet for immediately available ER MD    Staff Present Rory Percy, MS, Exercise Physiologist;Junia Nygren, BS, Exercise Physiologist    Virtual Visit No    Medication changes reported     No    Fall or balance concerns reported    No    Warm-up and Cool-down Performed on first and last piece of equipment    Resistance Training Performed Yes    VAD Patient? No    PAD/SET Patient? No      Pain Assessment   Currently in Pain? No/denies    Multiple Pain Sites No                Social History   Tobacco Use  Smoking Status Never  Smokeless Tobacco Never    Goals Met:  Independence with exercise equipment Exercise tolerated well No report of concerns or symptoms today  Goals Unmet:  Not Applicable  Comments: Pt able to follow exercise prescription today without complaint.  Will continue to monitor for progression.    Dr. Bethann Punches is Medical Director for [facility] Cardiac Rehabilitation.  Dr. Vida Rigger is Medical Director for [facility] Pulmonary Rehabilitation.

## 2023-08-23 ENCOUNTER — Ambulatory Visit
Admission: RE | Admit: 2023-08-23 | Discharge: 2023-08-23 | Disposition: A | Discharge status: Home / Self Care | Payer: Medicare PPO | Source: Ambulatory Visit | Attending: Family Medicine | Admitting: Family Medicine

## 2023-08-23 DIAGNOSIS — Z1231 Encounter for screening mammogram for malignant neoplasm of breast: Secondary | ICD-10-CM | POA: Diagnosis present

## 2023-08-25 ENCOUNTER — Ambulatory Visit: Payer: Medicare PPO | Admitting: Dermatology

## 2023-08-25 DIAGNOSIS — W908XXA Exposure to other nonionizing radiation, initial encounter: Secondary | ICD-10-CM

## 2023-08-25 DIAGNOSIS — L578 Other skin changes due to chronic exposure to nonionizing radiation: Secondary | ICD-10-CM | POA: Diagnosis not present

## 2023-08-25 DIAGNOSIS — L820 Inflamed seborrheic keratosis: Secondary | ICD-10-CM

## 2023-08-25 DIAGNOSIS — L0100 Impetigo, unspecified: Secondary | ICD-10-CM | POA: Diagnosis not present

## 2023-08-25 DIAGNOSIS — Z85828 Personal history of other malignant neoplasm of skin: Secondary | ICD-10-CM

## 2023-08-25 MED ORDER — MUPIROCIN 2 % EX OINT: TOPICAL_OINTMENT | CUTANEOUS | 0 refills | Status: DC

## 2023-08-25 NOTE — Patient Instructions (Signed)

## 2023-08-25 NOTE — Progress Notes (Signed)
   Follow-Up Visit   Subjective  Joyce Evans is a 83 y.o. female who presents for the following: Irritation and history of sores of the right nares. She has applied Aquaphor and saline solution in the nose. She has a history of BCC of the dorsum nose supratip that was treated with radiation, treatment completed in October 2024. Dr Rushie Chestnut feels like area may be irritated from oxygen tubes. She also has a small bump on the right cheek to check, irritated by oxygen tubing.    The following portions of the chart were reviewed this encounter and updated as appropriate: medications, allergies, medical history  Review of Systems:  No other skin or systemic complaints except as noted in HPI or Assessment and Plan.  Objective  Well appearing patient in no apparent distress; mood and affect are within normal limits.  A focused examination was performed of the following areas: Face  Relevant physical exam findings are noted in the Assessment and Plan.  Right Cheek x 4 Erythematous stuck-on, waxy papules  Assessment & Plan  ACTINIC DAMAGE - chronic, secondary to cumulative UV radiation exposure/sun exposure over time - diffuse scaly erythematous macules with underlying dyspigmentation - Recommend daily broad spectrum sunscreen SPF 30+ to sun-exposed areas, reapply every 2 hours as needed.  - Recommend staying in the shade or wearing long sleeves, sun glasses (UVA+UVB protection) and wide brim hats (4-inch brim around the entire circumference of the hat). - Call for new or changing lesions.  HISTORY OF BASAL CELL CARCINOMA OF THE SKIN Dorsum nose supratip, 2024, tx with radiaition - No evidence of recurrence today - Recommend regular full body skin exams - Recommend daily broad spectrum sunscreen SPF 30+ to sun-exposed areas, reapply every 2 hours as needed.  - Call if any new or changing lesions are noted between office visits  INFLAMED SEBORRHEIC KERATOSIS Right Cheek x  4 Symptomatic, irritated by oxygen tubing, patient would like treated. Destruction of lesion - Right Cheek x 4  Destruction method: cryotherapy   Informed consent: discussed and consent obtained   Lesion destroyed using liquid nitrogen: Yes   Region frozen until ice ball extended beyond lesion: Yes   Outcome: patient tolerated procedure well with no complications   Post-procedure details: wound care instructions given   Additional details:  Prior to procedure, discussed risks of blister formation, small wound, skin dyspigmentation, or rare scar following cryotherapy. Recommend Vaseline ointment to treated areas while healing.  IMPETIGO, RESOLVING, VS IRRITATION FROM OXYGEN  Exam: Mild crusting in right nostril.  Treatment: Start mupirocin 2% ointment BID in both nostrils x 7-10 days. Then restart saline nasal spray and Aquaphor ointment daily while using oxygen cannula to prevent recurrence    Return as scheduled with Dr Dollene Primrose, CMA, am acting as scribe for Willeen Niece, MD .   Documentation: I have reviewed the above documentation for accuracy and completeness, and I agree with the above.  Willeen Niece, MD

## 2023-09-16 ENCOUNTER — Encounter: Payer: Medicare PPO | Attending: Radiation Oncology

## 2023-09-16 DIAGNOSIS — C44311 Basal cell carcinoma of skin of nose: Secondary | ICD-10-CM | POA: Insufficient documentation

## 2023-10-26 ENCOUNTER — Encounter: Attending: Radiation Oncology

## 2023-10-26 DIAGNOSIS — C44311 Basal cell carcinoma of skin of nose: Secondary | ICD-10-CM

## 2023-10-26 NOTE — Progress Notes (Signed)
 Daily Session Note  Patient Details  Name: Joyce Evans MRN: [id number] Date of Birth: 1941-05-25 Referring Provider:   Gattis Kass Cancer Associated Rehabilitation & Exercise from 05/11/2023 in [facility] Cardiac and Pulmonary Rehab  Referring Provider Glenis Langdon, MD       Encounter Date: 10/26/2023  Check In:  Session Check In - 10/26/23 1230       Check-In   Supervising physician immediately available to respond to emergencies See telemetry face sheet for immediately available ER MD    Location [facility]-Cardiac & Pulmonary Rehab    Staff Present Lyell Samuel, MS, Exercise Physiologist;Conal Shetley, BS, Exercise Physiologist    Virtual Visit No    Medication changes reported     Yes    Comments eliquis dosage decreased    Fall or balance concerns reported    No    Warm-up and Cool-down Performed on first and last piece of equipment    Resistance Training Performed Yes    VAD Patient? No    PAD/SET Patient? No      Pain Assessment   Currently in Pain? No/denies    Multiple Pain Sites No                Social History   Tobacco Use  Smoking Status Never  Smokeless Tobacco Never    Goals Met:  Independence with exercise equipment Exercise tolerated well No report of concerns or symptoms today  Goals Unmet:  Not Applicable  Comments: Pt able to follow exercise prescription today without complaint.  Will continue to monitor for progression.    Dr. Firman Hughes is Medical Director for [facility] Cardiac Rehabilitation.  Dr. Fuad Aleskerov is Medical Director for [facility] Pulmonary Rehabilitation.

## 2023-10-28 ENCOUNTER — Encounter

## 2023-11-02 ENCOUNTER — Encounter

## 2023-11-04 ENCOUNTER — Encounter

## 2023-11-09 ENCOUNTER — Encounter

## 2023-11-11 ENCOUNTER — Encounter

## 2023-11-16 ENCOUNTER — Encounter

## 2023-11-17 ENCOUNTER — Ambulatory Visit: Payer: Medicare PPO | Admitting: Dermatology

## 2023-11-18 ENCOUNTER — Ambulatory Visit: Payer: Medicare PPO | Admitting: Radiation Oncology

## 2023-11-18 ENCOUNTER — Encounter

## 2023-11-23 ENCOUNTER — Encounter: Attending: Radiation Oncology

## 2023-11-23 DIAGNOSIS — C44311 Basal cell carcinoma of skin of nose: Secondary | ICD-10-CM | POA: Insufficient documentation

## 2023-11-23 NOTE — Progress Notes (Signed)
 Daily Session Note  Patient Details  Name: Joyce Evans MRN: [id number] Date of Birth: [birth year] Referring Provider:   Gattis Kass Cancer Associated Rehabilitation & Exercise from 05/11/2023 in [facility] Cardiac and Pulmonary Rehab  Referring Provider Glenis Langdon, MD       Encounter Date: 11/23/2023  Check In:  Session Check In - 11/23/23 1402       Check-In   Supervising physician immediately available to respond to emergencies See telemetry face sheet for immediately available ER MD    Location [facility]-Cardiac & Pulmonary Rehab    Staff Present Lyell Samuel, MS, Exercise Physiologist;Susanne Bice, RN, BSN, CCRP    Virtual Visit No    Medication changes reported     No    Fall or balance concerns reported    No    Warm-up and Cool-down Performed on first and last piece of equipment    Resistance Training Performed Yes    VAD Patient? No    PAD/SET Patient? No      Pain Assessment   Currently in Pain? No/denies    Multiple Pain Sites No                Social History   Tobacco Use  Smoking Status Never  Smokeless Tobacco Never    Goals Met:  Independence with exercise equipment Exercise tolerated well No report of concerns or symptoms today  Goals Unmet:  Not Applicable  Comments: Pt able to follow exercise prescription today without complaint.  Will continue to monitor for progression.    Dr. Firman Hughes is Medical Director for [facility] Cardiac Rehabilitation.  Dr. Fuad Aleskerov is Medical Director for [facility] Pulmonary Rehabilitation.

## 2023-11-25 ENCOUNTER — Encounter

## 2023-11-30 ENCOUNTER — Encounter

## 2023-11-30 DIAGNOSIS — C44311 Basal cell carcinoma of skin of nose: Secondary | ICD-10-CM

## 2023-11-30 NOTE — Progress Notes (Signed)
 Daily Session Note  Patient Details  Name: Joyce Evans MRN: [id number] Date of Birth: Mar 10, 1941 Referring Provider:   Gattis Kass Cancer Associated Rehabilitation & Exercise from 05/11/2023 in [facility] Cardiac and Pulmonary Rehab  Referring Provider Glenis Langdon, MD       Encounter Date: 11/30/2023  Check In:  Session Check In - 11/30/23 1229       Check-In   Supervising physician immediately available to respond to emergencies See telemetry face sheet for immediately available ER MD    Location [facility]-Cardiac & Pulmonary Rehab    Staff Present Lyell Samuel, MS, Exercise Physiologist;Joane Postel, BS, Exercise Physiologist    Virtual Visit No    Medication changes reported     No    Fall or balance concerns reported    No    Warm-up and Cool-down Performed on first and last piece of equipment    Resistance Training Performed Yes    VAD Patient? No    PAD/SET Patient? No      Pain Assessment   Currently in Pain? No/denies    Multiple Pain Sites No                Social History   Tobacco Use  Smoking Status Never  Smokeless Tobacco Never    Goals Met:  Independence with exercise equipment Exercise tolerated well No report of concerns or symptoms today  Goals Unmet:  Not Applicable  Comments: Pt able to follow exercise prescription today without complaint.  Will continue to monitor for progression.    Dr. Firman Hughes is Medical Director for [facility] Cardiac Rehabilitation.  Dr. Fuad Aleskerov is Medical Director for [facility] Pulmonary Rehabilitation.

## 2023-12-02 ENCOUNTER — Encounter

## 2023-12-07 ENCOUNTER — Encounter

## 2023-12-09 ENCOUNTER — Encounter

## 2023-12-14 ENCOUNTER — Encounter

## 2023-12-16 ENCOUNTER — Encounter

## 2023-12-21 ENCOUNTER — Encounter

## 2023-12-23 ENCOUNTER — Encounter

## 2023-12-28 ENCOUNTER — Encounter: Attending: Radiation Oncology

## 2023-12-28 ENCOUNTER — Ambulatory Visit: Admitting: Dermatology

## 2023-12-28 ENCOUNTER — Encounter: Payer: Self-pay | Admitting: Dermatology

## 2023-12-28 DIAGNOSIS — D692 Other nonthrombocytopenic purpura: Secondary | ICD-10-CM

## 2023-12-28 DIAGNOSIS — L578 Other skin changes due to chronic exposure to nonionizing radiation: Secondary | ICD-10-CM | POA: Diagnosis not present

## 2023-12-28 DIAGNOSIS — Z8589 Personal history of malignant neoplasm of other organs and systems: Secondary | ICD-10-CM

## 2023-12-28 DIAGNOSIS — L853 Xerosis cutis: Secondary | ICD-10-CM

## 2023-12-28 DIAGNOSIS — D229 Melanocytic nevi, unspecified: Secondary | ICD-10-CM

## 2023-12-28 DIAGNOSIS — L570 Actinic keratosis: Secondary | ICD-10-CM | POA: Diagnosis not present

## 2023-12-28 DIAGNOSIS — L820 Inflamed seborrheic keratosis: Secondary | ICD-10-CM

## 2023-12-28 DIAGNOSIS — Z85828 Personal history of other malignant neoplasm of skin: Secondary | ICD-10-CM

## 2023-12-28 DIAGNOSIS — W908XXA Exposure to other nonionizing radiation, initial encounter: Secondary | ICD-10-CM | POA: Diagnosis not present

## 2023-12-28 DIAGNOSIS — Z85820 Personal history of malignant melanoma of skin: Secondary | ICD-10-CM

## 2023-12-28 DIAGNOSIS — L821 Other seborrheic keratosis: Secondary | ICD-10-CM

## 2023-12-28 DIAGNOSIS — L814 Other melanin hyperpigmentation: Secondary | ICD-10-CM

## 2023-12-28 DIAGNOSIS — C44311 Basal cell carcinoma of skin of nose: Secondary | ICD-10-CM | POA: Insufficient documentation

## 2023-12-28 DIAGNOSIS — Z1283 Encounter for screening for malignant neoplasm of skin: Secondary | ICD-10-CM | POA: Diagnosis not present

## 2023-12-28 DIAGNOSIS — D1801 Hemangioma of skin and subcutaneous tissue: Secondary | ICD-10-CM

## 2023-12-28 NOTE — Progress Notes (Signed)
 Daily Session Note  Patient Details  Name: Joyce Evans MRN: [id number] Date of Birth: 04/29/1941 Referring Provider:   Gattis Kass Cancer Associated Rehabilitation & Exercise from 05/11/2023 in [facility] Cardiac and Pulmonary Rehab  Referring Provider Glenis Langdon, MD    Encounter Date: 12/28/2023  Check In:  Session Check In - 12/28/23 1250       Check-In   Supervising physician immediately available to respond to emergencies See telemetry face sheet for immediately available ER MD    Location [facility]-Cardiac & Pulmonary Rehab    Staff Present Lyell Samuel, MS, Exercise Physiologist;Kala Ambriz, BS, Exercise Physiologist    Virtual Visit No    Medication changes reported     No    Fall or balance concerns reported    No    Warm-up and Cool-down Performed on first and last piece of equipment    Resistance Training Performed Yes    VAD Patient? No    PAD/SET Patient? No      Pain Assessment   Currently in Pain? No/denies    Multiple Pain Sites No             Social History   Tobacco Use  Smoking Status Never  Smokeless Tobacco Never    Goals Met:  Independence with exercise equipment Exercise tolerated well No report of concerns or symptoms today  Goals Unmet:  Not Applicable  Comments: Pt able to follow exercise prescription today without complaint.  Will continue to monitor for progression.    Dr. Firman Hughes is Medical Director for [facility] Cardiac Rehabilitation.  Dr. Fuad Aleskerov is Medical Director for [facility] Pulmonary Rehabilitation.

## 2023-12-28 NOTE — Patient Instructions (Addendum)
 Actinic keratoses are precancerous spots that appear secondary to cumulative UV radiation exposure/sun exposure over time. They are chronic with expected duration over 1 year. A portion of actinic keratoses will progress to squamous cell carcinoma of the skin. It is not possible to reliably predict which spots will progress to skin cancer and so treatment is recommended to prevent development of skin cancer.  Recommend daily broad spectrum sunscreen SPF 30+ to sun-exposed areas, reapply every 2 hours as needed.  Recommend staying in the shade or wearing long sleeves, sun glasses (UVA+UVB protection) and wide brim hats (4-inch brim around the entire circumference of the hat). Call for new or changing lesions.   Cryotherapy Aftercare  Wash gently with soap and water everyday.   Apply Vaseline and Band-Aid daily until healed.   Seborrheic Keratosis  What causes seborrheic keratoses? Seborrheic keratoses are harmless, common skin growths that first appear during adult life.  As time goes by, more growths appear.  Some people may develop a large number of them.  Seborrheic keratoses appear on both covered and uncovered body parts.  They are not caused by sunlight.  The tendency to develop seborrheic keratoses can be inherited.  They vary in color from skin-colored to gray, brown, or even black.  They can be either smooth or have a rough, warty surface.   Seborrheic keratoses are superficial and look as if they were stuck on the skin.  Under the microscope this type of keratosis looks like layers upon layers of skin.  That is why at times the top layer may seem to fall off, but the rest of the growth remains and re-grows.    Treatment Seborrheic keratoses do not need to be treated, but can easily be removed in the office.  Seborrheic keratoses often cause symptoms when they rub on clothing or jewelry.  Lesions can be in the way of shaving.  If they become inflamed, they can cause itching, soreness, or  burning.  Removal of a seborrheic keratosis can be accomplished by freezing, burning, or surgery. If any spot bleeds, scabs, or grows rapidly, please return to have it checked, as these can be an indication of a skin cancer.   Gentle Skin Care Guide  1. Bathe no more than once a day.  2. Avoid bathing in hot water  3. Use a mild soap like Dove, Vanicream, Cetaphil, CeraVe. Can use Lever 2000 or Cetaphil antibacterial soap  4. Use soap only where you need it. On most days, use it under your arms, between your legs, and on your feet. Let the water rinse other areas unless visibly dirty.  5. When you get out of the bath/shower, use a towel to gently blot your skin dry, don't rub it.  6. While your skin is still a little damp, apply a moisturizing cream such as Vanicream, CeraVe, Cetaphil, Eucerin, Sarna lotion or plain Vaseline Jelly. For hands apply Neutrogena [place] Hand Cream or Excipial Hand Cream.  7. Reapply moisturizer any time you start to itch or feel dry.  8. Sometimes using free and clear laundry detergents can be helpful. Fabric softener sheets should be avoided. Downy Free & Gentle liquid, or any liquid fabric softener that is free of dyes and perfumes, it acceptable to use  9. If your doctor has given you prescription creams you may apply moisturizers over them      Melanoma ABCDEs  Melanoma is the most dangerous type of skin cancer, and is the leading cause of death from  skin disease.  You are more likely to develop melanoma if you: Have light-colored skin, light-colored eyes, or red or blond hair Spend a lot of time in the sun Tan regularly, either outdoors or in a tanning bed Have had blistering sunburns, especially during childhood Have a close family member who has had a melanoma Have atypical moles or large birthmarks  Early detection of melanoma is key since treatment is typically straightforward and cure rates are extremely high if we catch it early.   The  first sign of melanoma is often a change in a mole or a new dark spot.  The ABCDE system is a way of remembering the signs of melanoma.  A for asymmetry:  The two halves do not match. B for border:  The edges of the growth are irregular. C for color:  A mixture of colors are present instead of an even brown color. D for diameter:  Melanomas are usually (but not always) greater than 6mm - the size of a pencil eraser. E for evolution:  The spot keeps changing in size, shape, and color.  Please check your skin once per month between visits. You can use a small mirror in front and a large mirror behind you to keep an eye on the back side or your body.   If you see any new or changing lesions before your next follow-up, please call to schedule a visit.  Please continue daily skin protection including broad spectrum sunscreen SPF 30+ to sun-exposed areas, reapplying every 2 hours as needed when you're outdoors.   Staying in the shade or wearing long sleeves, sun glasses (UVA+UVB protection) and wide brim hats (4-inch brim around the entire circumference of the hat) are also recommended for sun protection.    Due to recent changes in healthcare laws, you may see results of your pathology and/or laboratory studies on MyChart before the doctors have had a chance to review them. We understand that in some cases there may be results that are confusing or concerning to you. Please understand that not all results are received at the same time and often the doctors may need to interpret multiple results in order to provide you with the best plan of care or course of treatment. Therefore, we ask that you please give us  2 business days to thoroughly review all your results before contacting the office for clarification. Should we see a critical lab result, you will be contacted sooner.   If You Need Anything After Your Visit  If you have any questions or concerns for your doctor, please call our main line at  [phone] and press option 4 to reach your doctor's medical assistant. If no one answers, please leave a voicemail as directed and we will return your call as soon as possible. Messages left after 4 pm will be answered the following business day.   You may also send us  a message via MyChart. We typically respond to MyChart messages within 1-2 business days.  For prescription refills, please ask your pharmacy to contact our office. Our fax number is [phone].  If you have an urgent issue when the clinic is closed that cannot wait until the next business day, you can page your doctor at the number below.    Please note that while we do our best to be available for urgent issues outside of office hours, we are not available 24/7.   If you have an urgent issue and are unable to reach us , you  may choose to seek medical care at your doctor's office, retail clinic, urgent care center, or emergency room.  If you have a medical emergency, please immediately call 911 or go to the emergency department.  Pager Numbers  - Dr. Bary Likes: [phone]  - Dr. Annette Barters: [phone]  - Dr. Felipe Horton: [phone]   In the event of inclement weather, please call our main line at [phone] for an update on the status of any delays or closures.  Dermatology Medication Tips: Please keep the boxes that topical medications come in in order to help keep track of the instructions about where and how to use these. Pharmacies typically print the medication instructions only on the boxes and not directly on the medication tubes.   If your medication is too expensive, please contact our office at [phone] option 4 or send us  a message through MyChart.   We are unable to tell what your co-pay for medications will be in advance as this is different depending on your insurance coverage. However, we may be able to find a substitute medication at lower cost or fill out paperwork to get insurance to cover a needed  medication.   If a prior authorization is required to get your medication covered by your insurance company, please allow us  1-2 business days to complete this process.  Drug prices often vary depending on where the prescription is filled and some pharmacies may offer cheaper prices.  The website www.goodrx.com contains coupons for medications through different pharmacies. The prices here do not account for what the cost may be with help from insurance (it may be cheaper with your insurance), but the website can give you the price if you did not use any insurance.  - You can print the associated coupon and take it with your prescription to the pharmacy.  - You may also stop by our office during regular business hours and pick up a GoodRx coupon card.  - If you need your prescription sent electronically to a different pharmacy, notify our office through [facility] or by phone at [phone] option 4.     Si Usted Necesita Algo Despus de Su Visita  Tambin puede enviarnos un mensaje a travs de Clinical cytogeneticist. Por lo general respondemos a los mensajes de MyChart en el transcurso de 1 a 2 das hbiles.  Para renovar recetas, por favor pida a su farmacia que se ponga en contacto con nuestra oficina. Franz Jacks de fax es [place] [phone].  Si tiene un asunto urgente cuando la clnica est cerrada y que no puede esperar hasta el siguiente da hbil, puede llamar/localizar a su doctor(a) al nmero que aparece a continuacin.   Por favor, tenga en cuenta que aunque hacemos todo lo posible para estar disponibles para asuntos urgentes fuera del horario de [place], no estamos disponibles las 24 horas del da, los 7 [address]  [address] de la [place].   Si tiene un problema urgente y no puede comunicarse con nosotros, puede optar por buscar atencin mdica  en el consultorio de su doctor(a), en una clnica privada, en un centro de atencin urgente o en una sala de emergencias.  Si tiene Engineer, drilling,  por favor llame inmediatamente al 911 o vaya a la sala de emergencias.  Nmeros de bper  - Dr. Bary Likes: [phone]  - Dra. Annette Barters: [phone]  - Dr. Felipe Horton: [phone]   En caso de inclemencias del tiempo, por favor llame a Lajuan Pila principal al [phone] para una actualizacin sobre el [place] de cualquier retraso  o cierre.  Consejos para la medicacin en dermatologa: Por favor, guarde las cajas en las que vienen los medicamentos de uso tpico para ayudarle a seguir las instrucciones sobre dnde y cmo usarlos. Las farmacias generalmente imprimen las instrucciones del medicamento slo en las cajas y no directamente en los tubos del [place].   Si su medicamento es muy caro, por favor, pngase en contacto con Bettyjane Brunet llamando al [phone] y presione la opcin 4 o envenos un mensaje a travs de Clinical cytogeneticist.   No podemos decirle cul ser su copago por los medicamentos por adelantado ya que esto es diferente dependiendo de la cobertura de su seguro. Sin embargo, es posible que podamos encontrar un medicamento sustituto a Audiological scientist un formulario para que el seguro cubra el medicamento que se considera necesario.   Si se requiere una autorizacin previa para que su compaa de seguros [place] su medicamento, por favor permtanos de 1 a 2 das hbiles para completar este proceso.  Los precios de los medicamentos varan con frecuencia dependiendo del Environmental consultant de dnde se surte la receta y alguna farmacias pueden ofrecer precios ms baratos.  El sitio web www.goodrx.com tiene cupones para medicamentos de Health and safety inspector. Los precios aqu no tienen en cuenta lo que podra costar con la ayuda del seguro (puede ser ms barato con su seguro), pero el sitio web puede darle el precio si no utiliz Tourist information centre manager.  - Puede imprimir el cupn correspondiente y llevarlo con su receta a la farmacia.  - Tambin puede pasar por nuestra oficina durante el horario de atencin  regular y Education officer, museum una tarjeta de cupones de GoodRx.  - Si necesita que su receta se enve electrnicamente a una farmacia diferente, informe a nuestra oficina a travs de MyChart de  o por telfono llamando al [phone] y presione la opcin 4.

## 2023-12-28 NOTE — Progress Notes (Signed)
 Follow-Up Visit   Subjective  Joyce Evans is a 83 y.o. female who presents for the following: Skin Cancer Screening and Full Body Skin Exam 6 - 8 month tbse Hx of bcc, hx of sccis, hx of scc, hx of melanoma, hx of aks and isks Spot at right eyebrow, under right arm   The patient presents for Total-Body Skin Exam (TBSE) for skin cancer screening and mole check. The patient has spots, moles and lesions to be evaluated, some may be new or changing and the patient may have concern these could be cancer.  The following portions of the chart were reviewed this encounter and updated as appropriate: medications, allergies, medical history  Review of Systems:  No other skin or systemic complaints except as noted in HPI or Assessment and Plan.  Objective  Well appearing patient in no apparent distress; mood and affect are within normal limits.  A full examination was performed including scalp, head, eyes, ears, nose, lips, neck, chest, axillae, abdomen, back, buttocks, bilateral upper extremities, bilateral lower extremities, hands, feet, fingers, toes, fingernails, and toenails. All findings within normal limits unless otherwise noted below.   Relevant physical exam findings are noted in the Assessment and Plan.  right forehead above brow x 2 (2) Erythematous thin papules/macules with gritty scale.  right tricep near elbow x 2 (2) Erythematous stuck-on, waxy papule or plaque  Assessment & Plan   SKIN CANCER SCREENING PERFORMED TODAY.  ACTINIC DAMAGE - Chronic condition, secondary to cumulative UV/sun exposure - diffuse scaly erythematous macules with underlying dyspigmentation - Recommend daily broad spectrum sunscreen SPF 30+ to sun-exposed areas, reapply every 2 hours as needed.  - Staying in the shade or wearing long sleeves, sun glasses (UVA+UVB protection) and wide brim hats (4-inch brim around the entire circumference of the hat) are also recommended for sun protection.  - Call  for new or changing lesions.  LENTIGINES, SEBORRHEIC KERATOSES, HEMANGIOMAS - Benign normal skin lesions - Benign-appearing - Call for any changes  MELANOCYTIC NEVI - Tan-brown and/or pink-flesh-colored symmetric macules and papules - Benign appearing on exam today - Observation - Call clinic for new or changing moles - Recommend daily use of broad spectrum spf 30+ sunscreen to sun-exposed areas.   Purpura - Chronic; persistent and recurrent.  Treatable, but not curable. 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  - Call if any new or changing lesions are noted between office visits  ACTINIC KERATOSIS (2) right forehead above brow x 2 (2) Actinic keratoses are precancerous spots that appear secondary to cumulative UV radiation exposure/sun exposure over time. They are chronic with expected duration over 1 year. A portion of actinic keratoses will progress to squamous cell carcinoma of the skin. It is not possible to reliably predict which spots will progress to skin cancer and so treatment is recommended to prevent development of skin cancer.  Recommend daily broad spectrum sunscreen SPF 30+ to sun-exposed areas, reapply every 2 hours as needed.  Recommend staying in the shade or wearing long sleeves, sun glasses (UVA+UVB protection) and wide brim hats (4-inch brim around the entire circumference of the hat). Call for new or changing lesions. Destruction of lesion - right forehead above brow x 2 (2) Complexity: simple   Destruction method: cryotherapy   Informed consent: discussed and consent obtained   Timeout:  patient name, date of birth, surgical site, and procedure verified Lesion destroyed using liquid nitrogen: Yes   Region frozen until ice ball extended beyond lesion: Yes   Outcome: patient tolerated procedure well with no complications   Post-procedure details: wound care instructions given   INFLAMED SEBORRHEIC KERATOSIS (2) right tricep near elbow x 2 (2) Symptomatic, irritating, patient would like treated. Destruction of lesion - right tricep near elbow x 2  (2) Complexity: simple   Destruction method: cryotherapy   Informed consent: discussed and consent obtained   Timeout:  patient name, date of birth, surgical site, and procedure verified Lesion destroyed using liquid nitrogen: Yes   Region frozen until ice ball extended beyond lesion: Yes   Outcome: patient tolerated procedure well with no complications   Post-procedure details: wound care instructions given   Return for 8 month tbse . IRandee Busing, CMA, am acting as scribe for Celine Collard, MD.   Documentation: I have reviewed the above documentation for accuracy and completeness, and I agree with the above.  Celine Collard, MD

## 2023-12-30 ENCOUNTER — Encounter

## 2024-01-04 ENCOUNTER — Encounter

## 2024-01-06 ENCOUNTER — Encounter

## 2024-01-11 ENCOUNTER — Encounter

## 2024-01-18 ENCOUNTER — Encounter

## 2024-01-20 ENCOUNTER — Encounter

## 2024-01-25 ENCOUNTER — Encounter: Attending: Radiation Oncology

## 2024-01-25 DIAGNOSIS — C44311 Basal cell carcinoma of skin of nose: Secondary | ICD-10-CM | POA: Insufficient documentation

## 2024-01-25 NOTE — Progress Notes (Signed)
 Daily Session Note  Patient Details  Name: Joyce Evans MRN: [id number] Date of Birth: Jun 04, 1941 Referring Provider:   [facility] from 05/11/2023 in [facility] Cardiac [facility] Pulmonary Rehab  Referring Provider Lenn Aran, MD    Encounter Date: 01/25/2024  Check In:  Session Check In - 01/25/24 1510       Check-In   Supervising physician immediately available to respond to emergencies See telemetry face sheet for immediately available ER MD    Location [facility]-Cardiac & Pulmonary Rehab    Staff Present Rollene Paterson, MS, Exercise Physiologist    Virtual Visit No    Medication changes reported     No    Fall or balance concerns reported    No    Warm-up and Cool-down Performed on first and last piece of equipment    Resistance Training Performed Yes    VAD Patient? No    PAD/SET Patient? No      Pain Assessment   Currently in Pain? No/denies    Multiple Pain Sites No             Social History   Tobacco Use  Smoking Status Never  Smokeless Tobacco Never    Goals Met:  Independence with exercise equipment Exercise tolerated well No report of concerns or symptoms today  Goals Unmet:  Not Applicable  Comments: Pt able to follow exercise prescription today without complaint.  Will continue to monitor for progression.    Dr. Oneil Pinal is Medical Director for [facility] Cardiac Rehabilitation.  Dr. Fuad Aleskerov is Medical Director for [facility] Pulmonary Rehabilitation.

## 2024-01-27 ENCOUNTER — Encounter

## 2024-02-01 ENCOUNTER — Encounter

## 2024-02-03 ENCOUNTER — Encounter

## 2024-02-08 ENCOUNTER — Encounter

## 2024-02-08 IMAGING — CT CT HIP*R* W/O CM
2 of 6 series · 14 of 46 positions shown, 19 images · non-contrast
Comparison: None.

CLINICAL DATA: Hip trauma

EXAM:
CT OF THE RIGHT HIP WITHOUT CONTRAST
TECHNIQUE: Multidetector CT imaging of the right hip was performed according to
the standard protocol. Multiplanar CT image reconstructions were
also generated.
RADIATION DOSE REDUCTION: This exam was performed according to the
departmental dose-optimization program which includes automated
exposure control, adjustment of the mA and/or kV according to
patient size and/or use of iterative reconstruction technique.

[Series 3: axial st · axial · 0.56mm/px · z∈[-528,-338]mm · 11 of 111 slices shown, 16 images]
[im 8/111  soft-tissue]
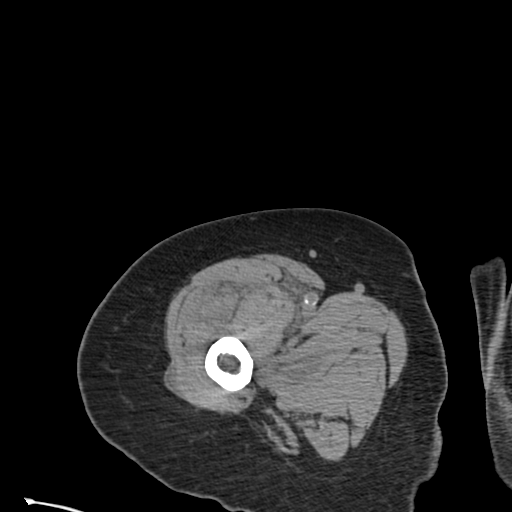
[im 8/111  bone]
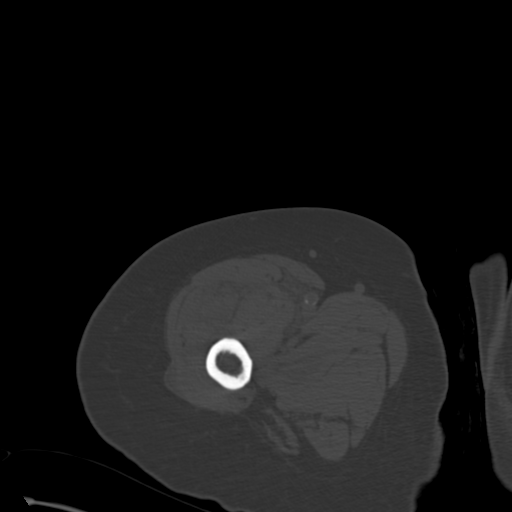
[im 23/111  soft-tissue]
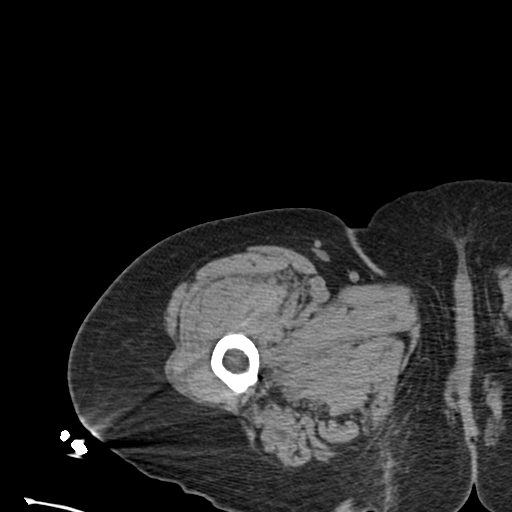
[im 30/111  soft-tissue]
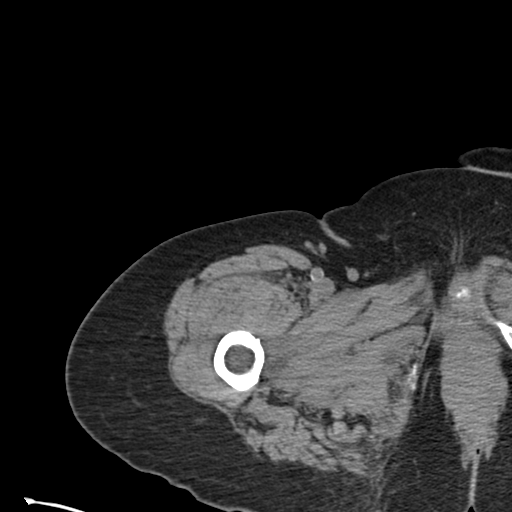
[im 37/111  soft-tissue]
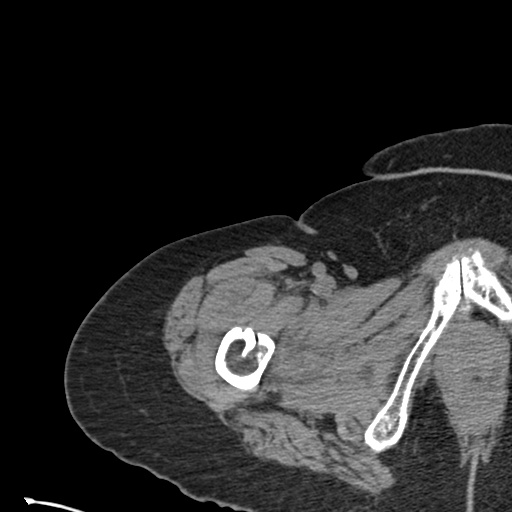
[im 52/111  soft-tissue]
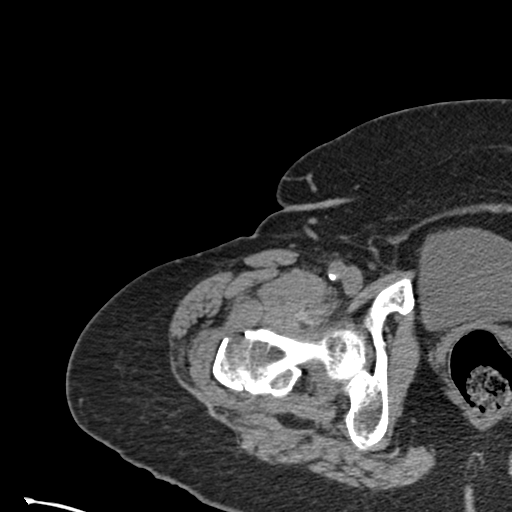
[im 59/111  soft-tissue]
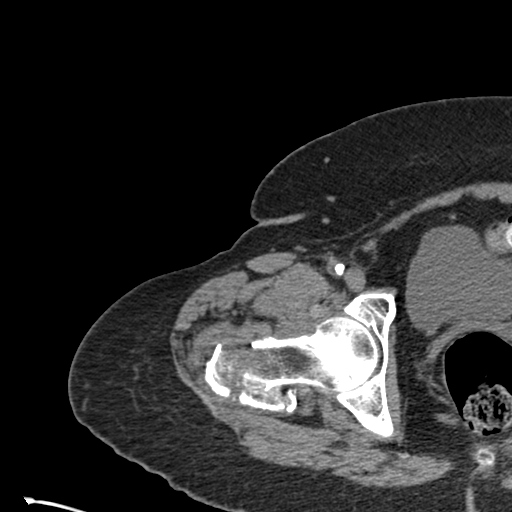
[im 74/111  soft-tissue]
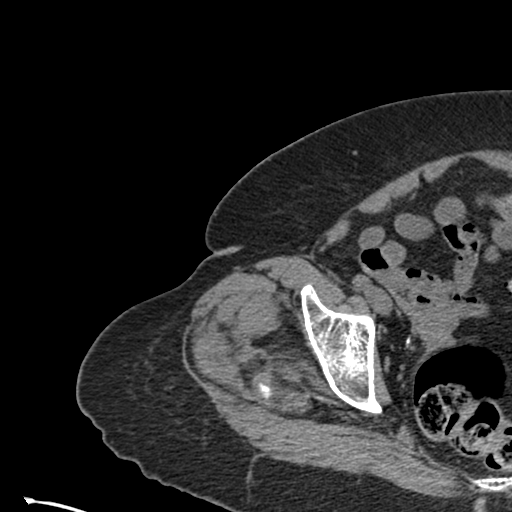
[im 81/111  soft-tissue]
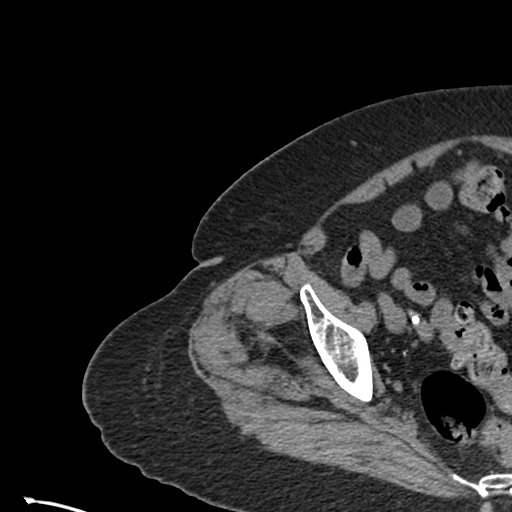
[im 81/111  lung]
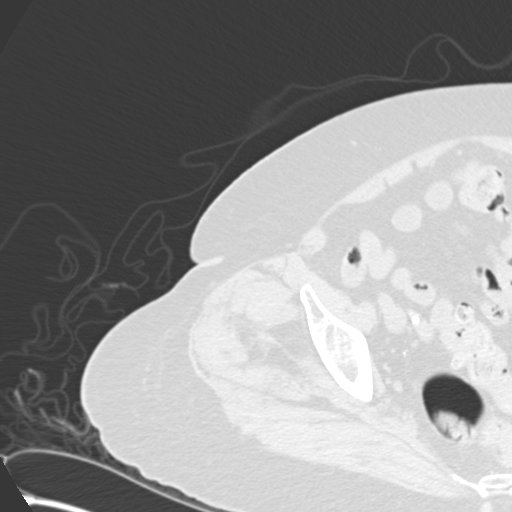
[im 89/111  soft-tissue]
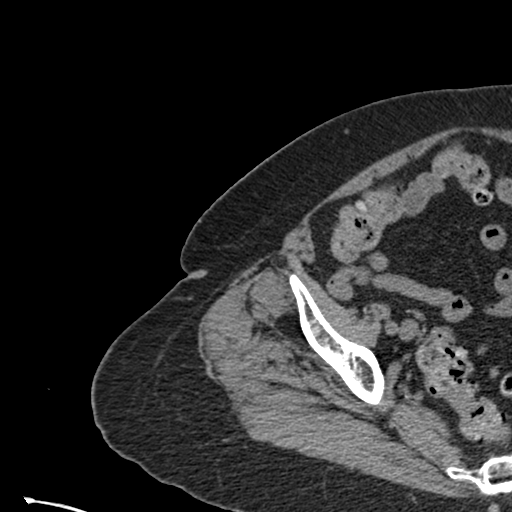
[im 89/111  lung]
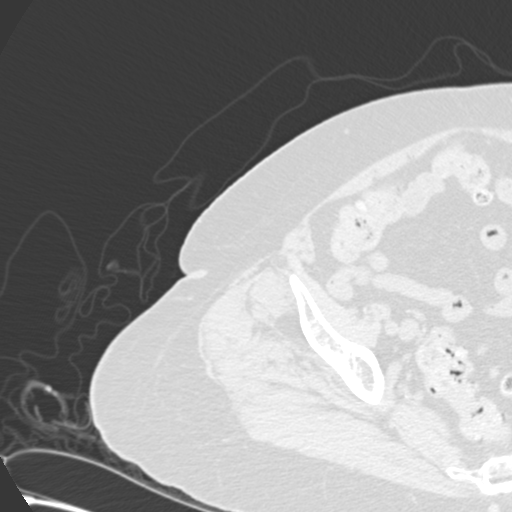
[im 89/111  bone]
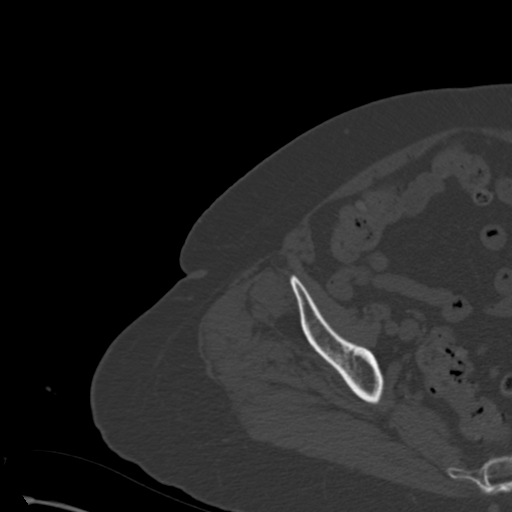
[im 96/111  lung]
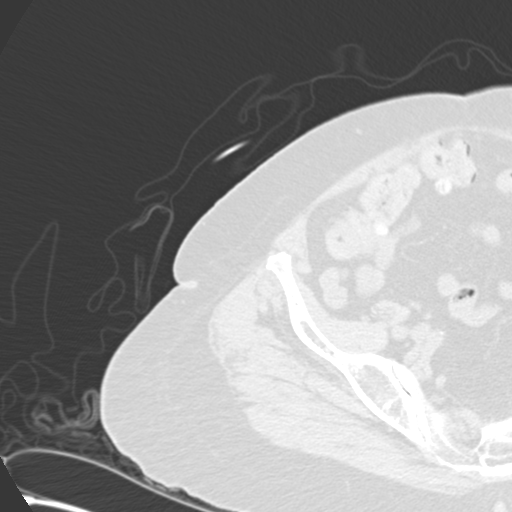
[im 103/111  soft-tissue]
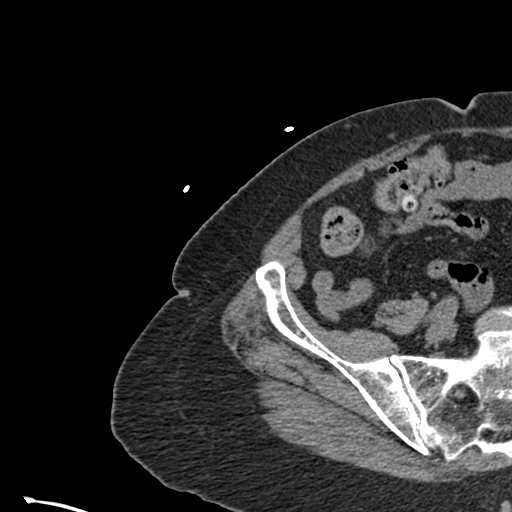
[im 103/111  lung]
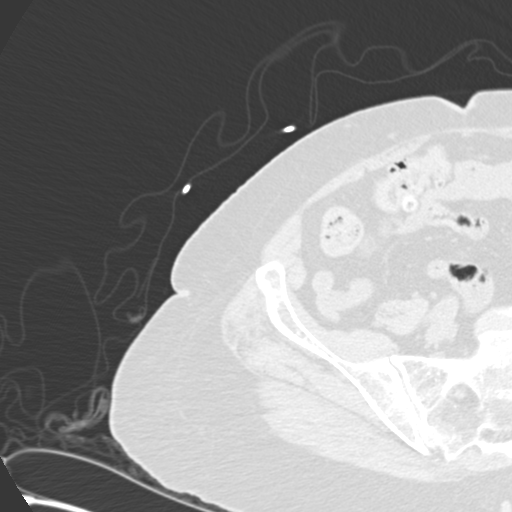

[Series 8: coronal st · coronal · 0.46mm/px · 3 of 87 slices shown]
[im 18/87  soft-tissue]
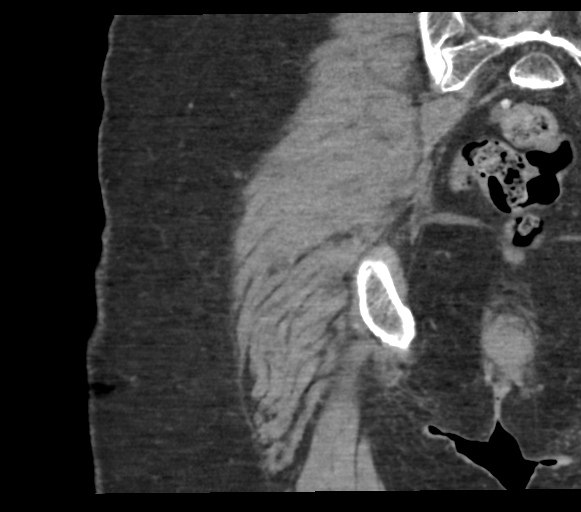
[im 35/87  soft-tissue]
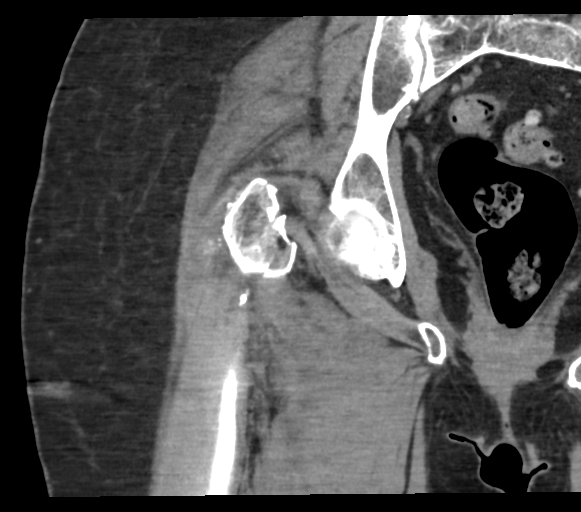
[im 52/87  soft-tissue]
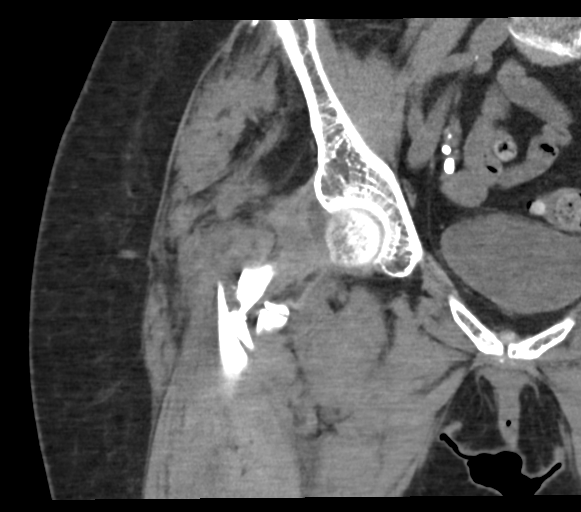

[14 of 46 positions shown; findings below may reference images not displayed]

FINDINGS: Bones/Joint/Cartilage

There is a comminuted intratrochanteric fracture of the right femur
with mild medial angulation. The fracture extends into the proximal
shaft of the right femur. No dislocation

Ligaments

Suboptimally assessed by CT.

Muscles and Tendons

Unremarkable

Soft tissues

Unremarkable
IMPRESSION: Comminuted intratrochanteric fracture of the right femur with mild
medial angulation and extension into the proximal femoral
metaphysis.

## 2024-02-08 IMAGING — CT CT HEAD W/O CM
4 of 5 series · 15 of 47 positions shown, 17 images · non-contrast
Comparison: 06/18/2020

CLINICAL DATA: Minor head trauma.  Mechanical fall.



[Series 2: head wo · axial · 0.49mm/px · z∈[-114,-19]mm · 4 of 33 slices shown]
[im 7/33  brain]
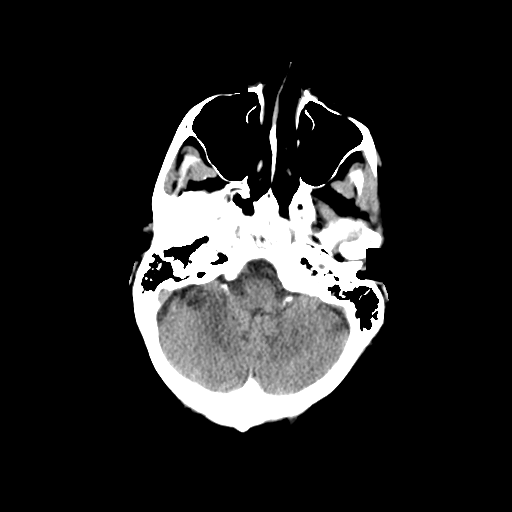
[im 13/33  brain]
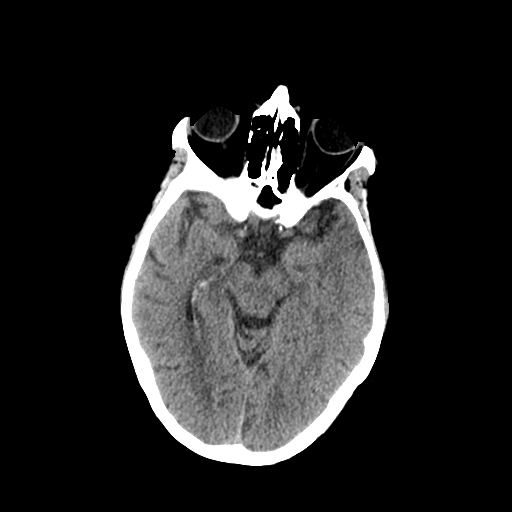
[im 20/33  brain]
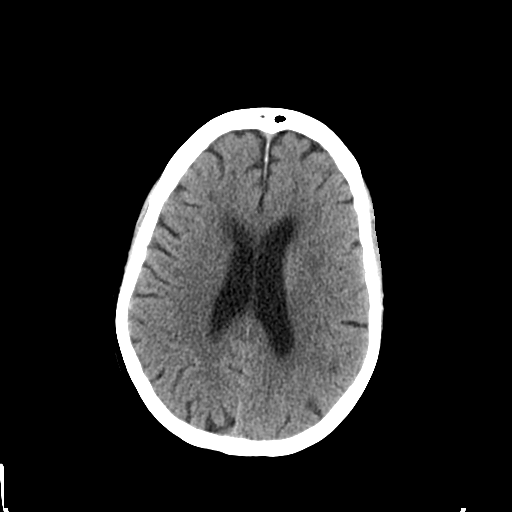
[im 26/33  brain]
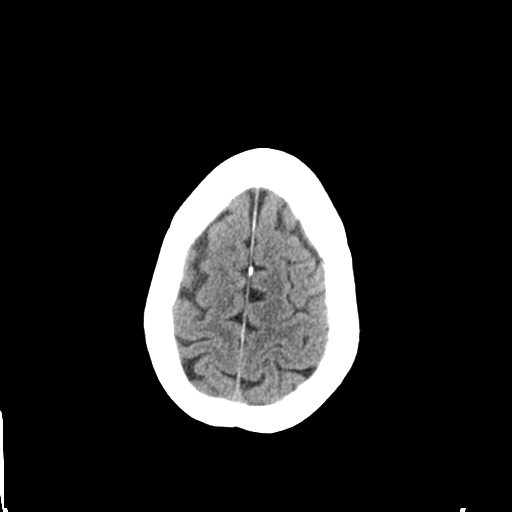

[Series 4: ax head wo · axial · 0.34mm/px · z∈[-90,+6]mm · 5 of 34 slices shown, 7 images]
[im 6/34  brain]
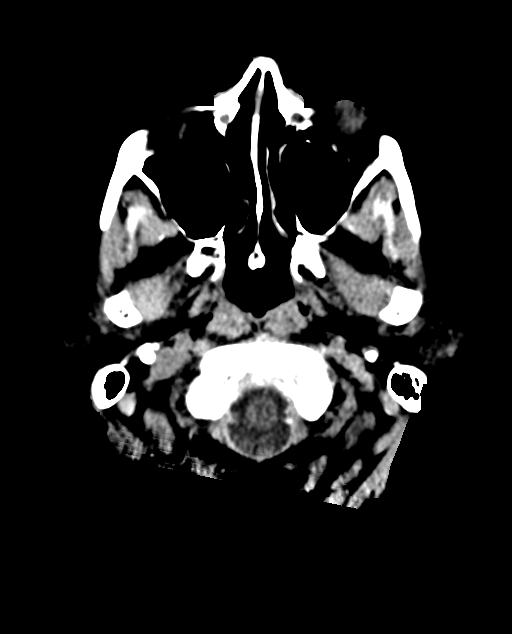
[im 6/34  bone]
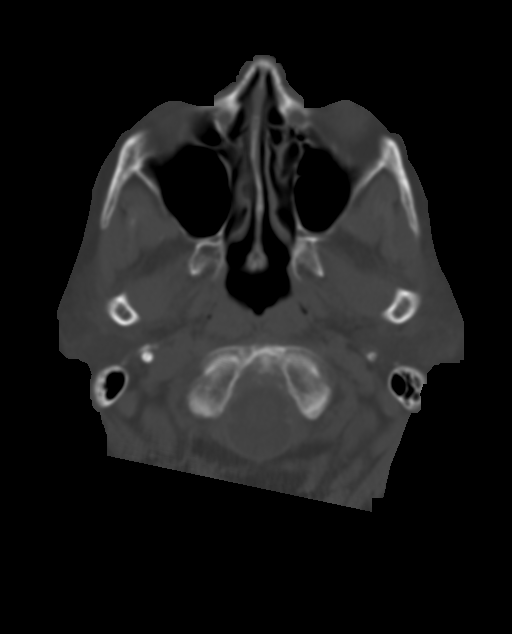
[im 12/34  brain]
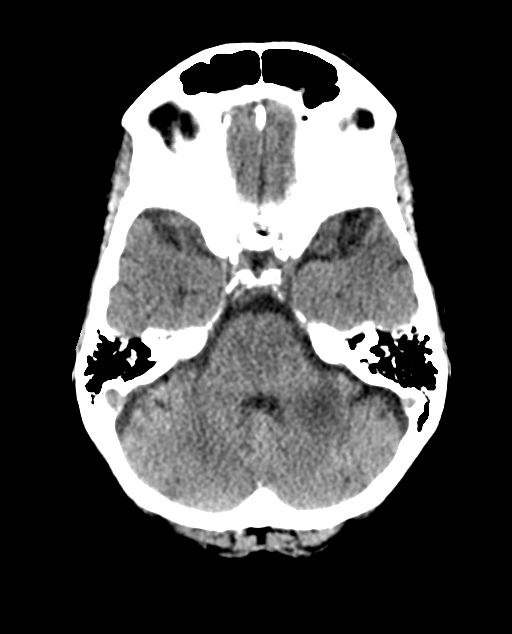
[im 17/34  brain]
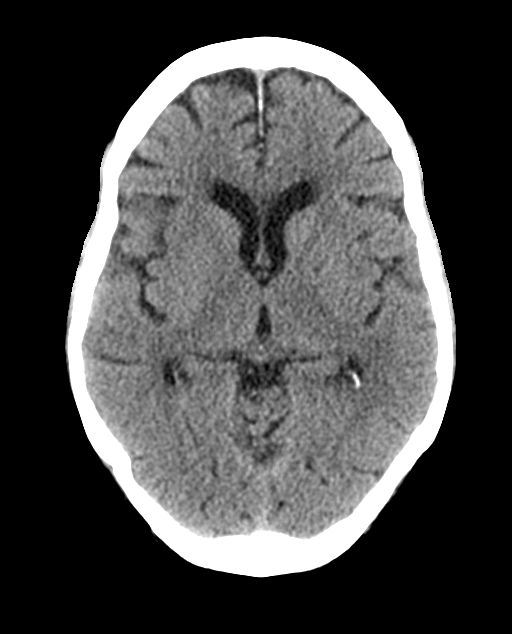
[im 23/34  brain]
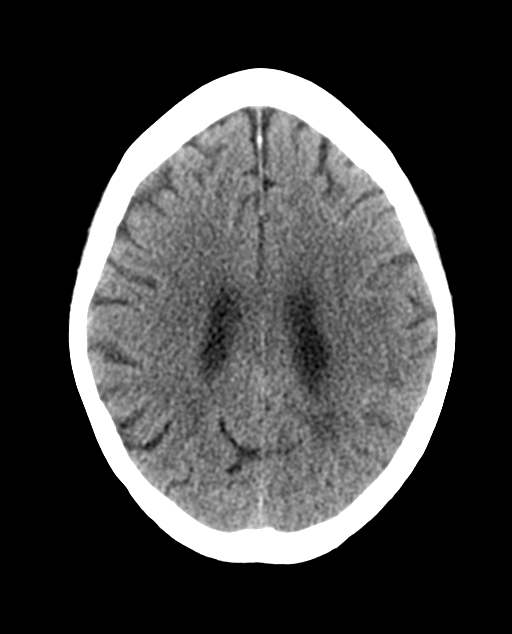
[im 28/34  brain]
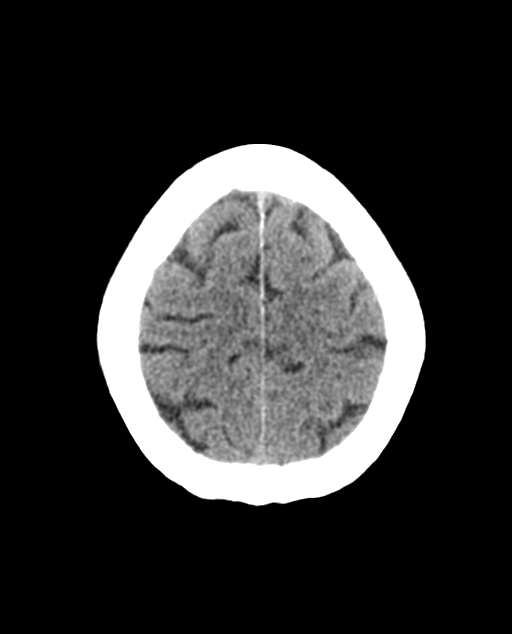
[im 28/34  bone]
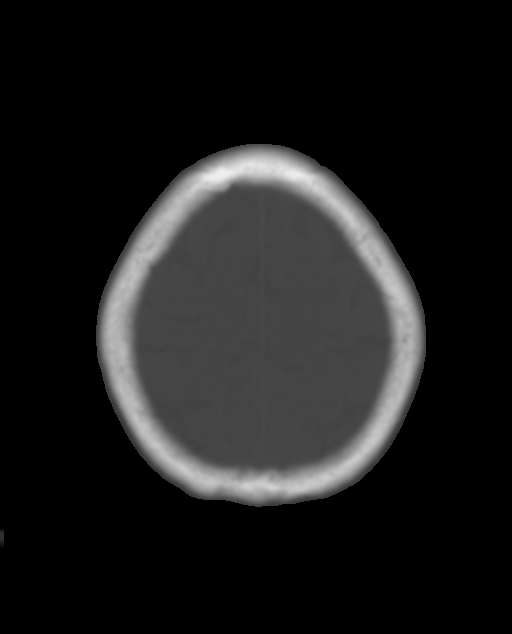

[Series 5: coronal soft tissue · coronal · 0.34mm/px · 3 of 64 slices shown]
[im 23/64  brain]
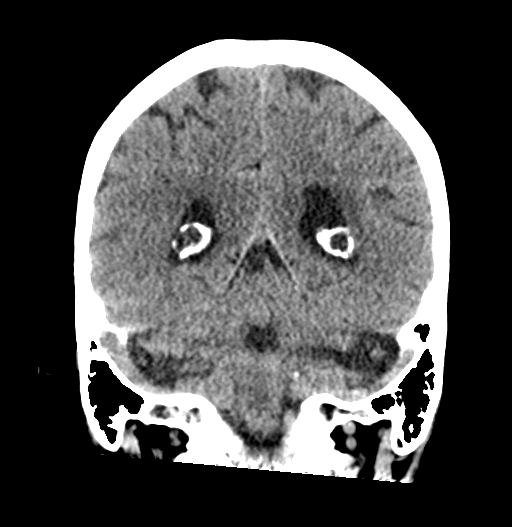
[im 29/64  brain]
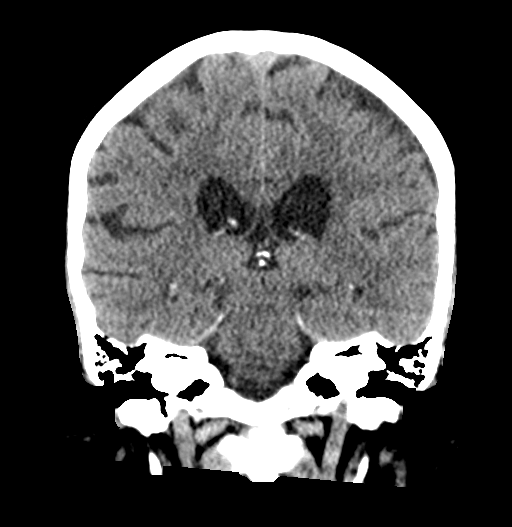
[im 35/64  brain]
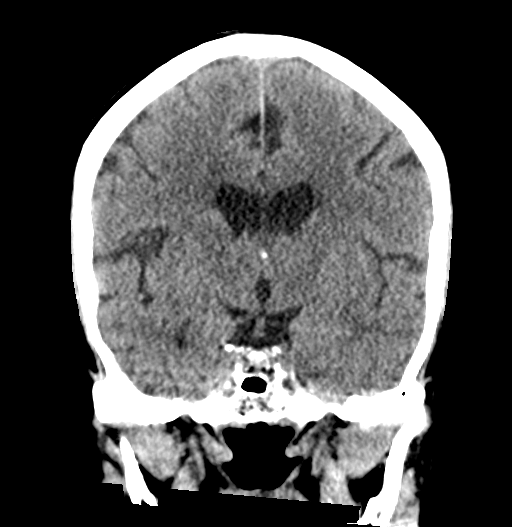

[Series 6: sagittal soft tissue · sagittal · 0.35mm/px · 3 of 49 slices shown]
[im 17/49  brain]
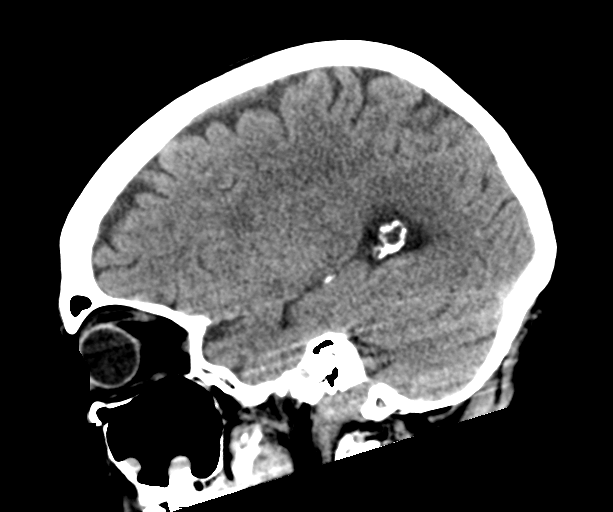
[im 25/49  brain]
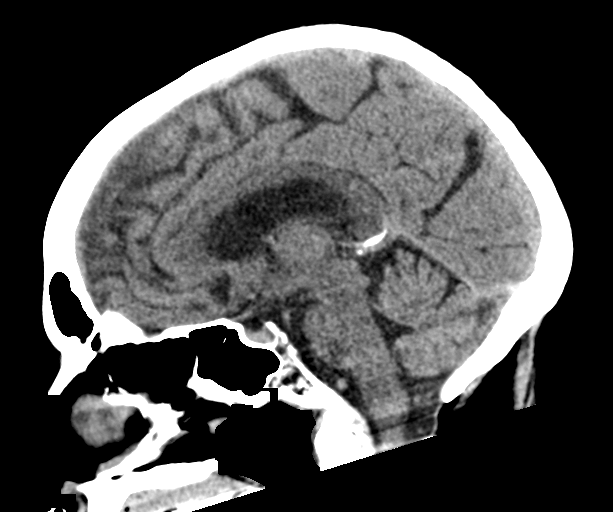
[im 33/49  brain]
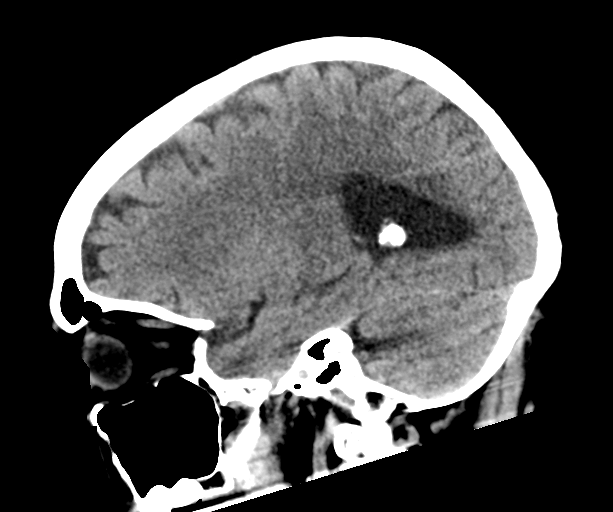

[15 of 47 positions shown; findings below may reference images not displayed]

FINDINGS: Brain: No evidence of acute infarction, hemorrhage, hydrocephalus,
extra-axial collection or mass lesion/mass effect. Patchy
low-attenuation changes in the deep white matter are similar to
prior study, likely small vessel ischemic changes.

Vascular: Moderate intracranial arterial vascular calcifications.

Skull: Calvarium appears intact.

Sinuses/Orbits: Mucous retention cyst in the right maxillary antrum.
Paranasal sinuses and mastoid air cells are otherwise clear.

Other: None.
IMPRESSION: No acute intracranial abnormalities. Chronic appearing small vessel
ischemic changes.

## 2024-02-10 ENCOUNTER — Encounter

## 2024-04-06 ENCOUNTER — Ambulatory Visit: Admitting: Dermatology

## 2024-04-06 DIAGNOSIS — L0100 Impetigo, unspecified: Secondary | ICD-10-CM | POA: Diagnosis not present

## 2024-04-06 DIAGNOSIS — L820 Inflamed seborrheic keratosis: Secondary | ICD-10-CM

## 2024-04-06 DIAGNOSIS — L821 Other seborrheic keratosis: Secondary | ICD-10-CM

## 2024-04-06 DIAGNOSIS — L853 Xerosis cutis: Secondary | ICD-10-CM

## 2024-04-06 DIAGNOSIS — L300 Nummular dermatitis: Secondary | ICD-10-CM | POA: Diagnosis not present

## 2024-04-06 MED ORDER — TRIAMCINOLONE ACETONIDE 0.1 % EX CREA: TOPICAL_CREAM | CUTANEOUS | 3 refills | Status: AC

## 2024-04-06 MED ORDER — MUPIROCIN 2 % EX OINT: TOPICAL_OINTMENT | CUTANEOUS | 2 refills | Status: AC

## 2024-04-06 NOTE — Patient Instructions (Addendum)
 Gentle Skin Care Guide  1. Bathe no more than once a day.  2. Avoid bathing in hot water  3. Use a mild soap like Dove, Vanicream, Cetaphil, CeraVe. Can use Lever 2000 or Cetaphil antibacterial soap  4. Use soap only where you need it. On most days, use it under your arms, between your legs, and on your feet. Let the water rinse other areas unless visibly dirty.  5. When you get out of the bath/shower, use a towel to gently blot your skin dry, don't rub it.  6. While your skin is still a little damp, apply a moisturizing cream such as Vanicream, CeraVe, Cetaphil, Eucerin, Sarna lotion or plain Vaseline Jelly. For hands apply Neutrogena [place] Hand Cream or Excipial Hand Cream.  7. Reapply moisturizer any time you start to itch or feel dry.  8. Sometimes using free and clear laundry detergents can be helpful. Fabric softener sheets should be avoided. Downy Free & Gentle liquid, or any liquid fabric softener that is free of dyes and perfumes, it acceptable to use  9. If your doctor has given you prescription creams you may apply moisturizers over them      Due to recent changes in healthcare laws, you may see results of your pathology and/or laboratory studies on MyChart before the doctors have had a chance to review them. We understand that in some cases there may be results that are confusing or concerning to you. Please understand that not all results are received at the same time and often the doctors may need to interpret multiple results in order to provide you with the best plan of care or course of treatment. Therefore, we ask that you please give us  2 business days to thoroughly review all your results before contacting the office for clarification. Should we see a critical lab result, you will be contacted sooner.   If You Need Anything After Your Visit  If you have any questions or concerns for your doctor, please call our main line at [phone] and press option 4 to reach  your doctor's medical assistant. If no one answers, please leave a voicemail as directed and we will return your call as soon as possible. Messages left after 4 pm will be answered the following business day.   You may also send us  a message via MyChart. We typically respond to MyChart messages within 1-2 business days.  For prescription refills, please ask your pharmacy to contact our office. Our fax number is [phone].  If you have an urgent issue when the clinic is closed that cannot wait until the next business day, you can page your doctor at the number below.    Please note that while we do our best to be available for urgent issues outside of office hours, we are not available 24/7.   If you have an urgent issue and are unable to reach us , you may choose to seek medical care at your doctor's office, retail clinic, urgent care center, or emergency room.  If you have a medical emergency, please immediately call 911 or go to the emergency department.  Pager Numbers  - Dr. Hester: [phone]  - Dr. Jackquline: [phone]  - Dr. Claudene: [phone]   - Dr. Raymund: [phone]  In the event of inclement weather, please call our main line at [phone] for an update on the status of any delays or closures.  Dermatology Medication Tips: Please keep the boxes that topical medications come in in order to help  keep track of the instructions about where and how to use these. Pharmacies typically print the medication instructions only on the boxes and not directly on the medication tubes.   If your medication is too expensive, please contact our office at [phone] option 4 or send us  a message through MyChart.   We are unable to tell what your co-pay for medications will be in advance as this is different depending on your insurance coverage. However, we may be able to find a substitute medication at lower cost or fill out paperwork to get insurance to cover a needed medication.    If a prior authorization is required to get your medication covered by your insurance company, please allow us  1-2 business days to complete this process.  Drug prices often vary depending on where the prescription is filled and some pharmacies may offer cheaper prices.  The website www.goodrx.com contains coupons for medications through different pharmacies. The prices here do not account for what the cost may be with help from insurance (it may be cheaper with your insurance), but the website can give you the price if you did not use any insurance.  - You can print the associated coupon and take it with your prescription to the pharmacy.  - You may also stop by our office during regular business hours and pick up a GoodRx coupon card.  - If you need your prescription sent electronically to a different pharmacy, notify our office through [facility] or by phone at [phone] option 4.     Si Usted Necesita Algo Despus de Su Visita  Tambin puede enviarnos un mensaje a travs de Clinical cytogeneticist. Por lo general respondemos a los mensajes de MyChart en el transcurso de 1 a 2 das hbiles.  Para renovar recetas, por favor pida a su farmacia que se ponga en contacto con nuestra oficina. Randi lakes de fax es [place] [phone].  Si tiene un asunto urgente cuando la clnica est cerrada y que no puede esperar hasta el siguiente da hbil, puede llamar/localizar a su doctor(a) al nmero que aparece a continuacin.   Por favor, tenga en cuenta que aunque hacemos todo lo posible para estar disponibles para asuntos urgentes fuera del horario de [place], no estamos disponibles las 24 horas del da, los 7 [address]  [address] de la [place].   Si tiene un problema urgente y no puede comunicarse con nosotros, puede optar por buscar atencin mdica  en el consultorio de su doctor(a), en una clnica privada, en un centro de atencin urgente o en una sala de emergencias.  Si tiene Engineer, drilling, por favor  llame inmediatamente al 911 o vaya a la sala de emergencias.  Nmeros de bper  - Dr. Hester: [phone]  - Dra. Jackquline: [phone]  - Dr. Claudene: [phone]  - Dra. Kitts: [phone]  En caso de inclemencias del [place], por favor llame a nuestra lnea principal al [phone] para una actualizacin sobre el estado de cualquier retraso o cierre.  Consejos para la medicacin en dermatologa: Por favor, guarde las cajas en las que vienen los medicamentos de uso tpico para ayudarle a seguir las instrucciones sobre dnde y cmo usarlos. Las farmacias generalmente imprimen las instrucciones del medicamento slo en las cajas y no directamente en los tubos del [place].   Si su medicamento es muy caro, por favor, pngase en contacto con landry rieger llamando al [phone] y presione la opcin 4 o envenos un mensaje a travs de Clinical cytogeneticist.   No podemos decirle cul  ser su copago por los medicamentos por adelantado ya que esto es diferente dependiendo de la cobertura de su seguro. Sin embargo, es posible que podamos encontrar un medicamento sustituto a Audiological scientist un formulario para que el seguro cubra el medicamento que se considera necesario.   Si se requiere una autorizacin previa para que su compaa de seguros malta su medicamento, por favor permtanos de 1 a 2 das hbiles para completar este proceso.  Los precios de los medicamentos varan con frecuencia dependiendo del Environmental consultant de dnde se surte la receta y alguna farmacias pueden ofrecer precios ms baratos.  El sitio web www.goodrx.com tiene cupones para medicamentos de Health and safety inspector. Los precios aqu no tienen en cuenta lo que podra costar con la ayuda del seguro (puede ser ms barato con su seguro), pero el sitio web puede darle el precio si no utiliz Tourist information centre manager.  - Puede imprimir el cupn correspondiente y llevarlo con su receta a la farmacia.  - Tambin puede pasar por nuestra oficina durante el  horario de atencin regular y Education officer, museum una tarjeta de cupones de GoodRx.  - Si necesita que su receta se enve electrnicamente a una farmacia diferente, informe a nuestra oficina a travs de MyChart de [place] o por telfono llamando al [phone] y presione la opcin 4.

## 2024-04-06 NOTE — Progress Notes (Signed)
   Follow-Up Visit   Subjective  Joyce Evans is a 83 y.o. female who presents for the following: pt c/o itching and lesion on the back and arms, and a sore inside the nose that hasn't resolved even when using Mupirocin  2% ointment.  The following portions of the chart were reviewed this encounter and updated as appropriate: medications, allergies, medical history  Review of Systems:  No other skin or systemic complaints except as noted in HPI or Assessment and Plan.  Objective  Well appearing patient in no apparent distress; mood and affect are within normal limits.   A focused examination was performed of the following areas: the face, back, and arms   Relevant exam findings are noted in the Assessment and Plan.  R upper back x 2, R upper arm x 1 (3) Pink crusted papules. Nose Unable to clearly visualize lesion  Assessment & Plan   SEBORRHEIC KERATOSIS - Stuck-on, waxy, tan-brown papules and/or plaques  - Benign-appearing - Discussed benign etiology and prognosis. - Observe - Call for any changes INFLAMED SEBORRHEIC KERATOSIS (3) R upper back x 2, R upper arm x 1 (3) Symptomatic, irritating, patient would like treated.  Destruction of lesion - R upper back x 2, R upper arm x 1 (3)  Destruction method: cryotherapy   Informed consent: discussed and consent obtained   Lesion destroyed using liquid nitrogen: Yes   Region frozen until ice ball extended beyond lesion: Yes   Outcome: patient tolerated procedure well with no complications   Post-procedure details: wound care instructions given   Additional details:  Prior to procedure, discussed risks of blister formation, small wound, skin dyspigmentation, or rare scar following cryotherapy. Recommend Vaseline ointment to treated areas while healing.   IMPETIGO Nose Pt c/o non-resolving lesion inside the nose. Advised patient to follow up with ENT. Continue Mupirocin  2% ointment BID.  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 Condition is symptomatic/ bothersome to patient. Not currently at goal.  Nummular dermatitis (eczema) is a chronic, relapsing, itchy rash that can significantly affect quality of life. It is often associated with dry skin and flares in the wintertime, and may require treatment with prescription topical anti-inflammatory medications, in addition to gentle skin care.  If there is associated atopic dermatitis and topicals are not working, then biologic injections may be necessary to clear rash and control symptoms.  Treatment Plan: Start TMC 0.1% cream to aa's back and arms BID PRN. Topical steroids (such as triamcinolone , fluocinolone, fluocinonide, mometasone, clobetasol, halobetasol, betamethasone, hydrocortisone) can cause thinning and lightening of the skin if they are used for too long in the same area. Your physician has selected the right strength medicine for your problem and area affected on the body. Please use your medication only as directed by your physician to prevent side effects.   Recommend mild soap (Dove) and moisturizing cream (Eucerin) 1-2 times daily.  Gentle skin care handout provided.    Return for appointment as scheduled.  Joyce Evans, CMA, am acting as scribe for Rexene Rattler, MD .   Documentation: I have reviewed the above documentation for accuracy and completeness, and I agree with the above.  Rexene Rattler, MD

## 2024-05-01 ENCOUNTER — Encounter: Payer: Self-pay | Admitting: Dermatology

## 2024-05-03 ENCOUNTER — Ambulatory Visit

## 2024-05-03 DIAGNOSIS — R21 Rash and other nonspecific skin eruption: Secondary | ICD-10-CM | POA: Diagnosis not present

## 2024-05-03 MED ORDER — MUPIROCIN 2 % EX OINT: TOPICAL_OINTMENT | CUTANEOUS | 1 refills | Status: AC

## 2024-05-03 NOTE — Progress Notes (Signed)
    Subjective   TENISHA FLEECE is a 83 y.o. female who presents for the following: Rash. Patient is established patient   Today patient reports: Rash on back and bilateral upper extremities for the past several months, itchy not painful. Has tried Triamcinolone ; unsure if that helped or not. Patient was recently diagnosed with Pulmonary Fibrosis and has had several medication changes. Patient denied recent environmental or dietary changes.   Review of Systems:    History of melanoma ROS - denies any fevers, chills, weight loss, night sweats, lymphadenopathy, nausea, vomiting, diarrhea, chest pain, shortness of breath, headaches, changes in vision  .  The following portions of the chart were reviewed this encounter and updated as appropriate: medications, allergies, medical history  Relevant Medical History:  Personal history of non melanoma skin cancer - see medical history for full details  and Personal history of melanoma - see medical history for full details   Objective  Well appearing patient in no apparent distress; mood and affect are within normal limits. Examination was performed of the: Waist Up Skin Exam: scalp, head, eyes, ears, nose, lips, neck, chest, axillae, upper extremities, abdomen, back, hands, fingers, fingernails   Examination notable for: Upper and lower back with pink excoriated papules  Examination limited by: Clothing and Patient deferred removal       Assessment & Plan   Rash of uncertain etiology, ddx arthropod vs papular urticaria vs hsv/vzv vs mild atopic dermatitis  - some impetiginized  - Undiagnosed new problem with uncertain prognosis  - Differential diagnosis, treatment options, prognosis, risk/ benefit, and side effects of treatment were discussed with the patient.  - To help confirm diagnosis, aerobe/anaerobe, hsv/vzv swabs collected in clinic. - Start mupirocin  2% ointment BID to crusted areas until clear  - Continue Triamcinolone  ointment 0.1%  twice daily to affected areas of skin if pruritic  Discussed side effect of potent topical steroids including atrophy, dyspigmentation, striae, telangectasia, folliculitis, loss of skin pigment, hair growth, tachyphylaxis, risk of systemic absorption with missuse. Recommended gentle skin care, liberal use of emollient    Chronic and persistent condition with duration or expected duration over one year. Condition is symptomatic and bothersome to patient. Patient is flaring and not currently at treatment goal.   Procedures, orders, diagnosis for this visit:  RASH AND OTHER NONSPECIFIC SKIN ERUPTION   Related Procedures HSV and VZV PCR Panel Anaerobic and Aerobic Culture  Rash and other nonspecific skin eruption -     HSV and VZV PCR Panel -     Anaerobic and Aerobic Culture  Other orders -     Mupirocin ; Apply to affected area of skin three times daily until healed  Dispense: 30 g; Refill: 1    Return to clinic: Return 3-4 weeks, for rash .  I, Emerick Ege, CMA am acting as scribe for Lauraine JAYSON Kanaris, MD   Documentation: I have reviewed the above documentation for accuracy and completeness, and I agree with the above.  Lauraine JAYSON Kanaris, MD

## 2024-05-03 NOTE — Patient Instructions (Signed)

## 2024-05-05 LAB — HSV AND VZV PCR PANEL
HSV 2 DNA: NEGATIVE
HSV-1 DNA: NEGATIVE
Varicella-Zoster, PCR: NEGATIVE

## 2024-05-08 ENCOUNTER — Ambulatory Visit: Payer: Self-pay

## 2024-05-08 NOTE — Progress Notes (Signed)
Patient informed and voiced good understanding.

## 2024-05-10 LAB — ANAEROBIC AND AEROBIC CULTURE

## 2024-05-31 ENCOUNTER — Ambulatory Visit

## 2024-06-13 ENCOUNTER — Emergency Department

## 2024-06-13 ENCOUNTER — Emergency Department
Admission: EM | Admit: 2024-06-13 | Discharge: 2024-06-13 | Disposition: A | Discharge status: Home / Self Care | Source: Ambulatory Visit | Attending: Emergency Medicine | Admitting: Emergency Medicine

## 2024-06-13 DIAGNOSIS — J849 Interstitial pulmonary disease, unspecified: Secondary | ICD-10-CM | POA: Diagnosis not present

## 2024-06-13 DIAGNOSIS — I4891 Unspecified atrial fibrillation: Secondary | ICD-10-CM | POA: Insufficient documentation

## 2024-06-13 DIAGNOSIS — J8410 Pulmonary fibrosis, unspecified: Secondary | ICD-10-CM | POA: Diagnosis not present

## 2024-06-13 DIAGNOSIS — I10 Essential (primary) hypertension: Secondary | ICD-10-CM | POA: Insufficient documentation

## 2024-06-13 DIAGNOSIS — R0602 Shortness of breath: Secondary | ICD-10-CM | POA: Diagnosis present

## 2024-06-13 LAB — BASIC METABOLIC PANEL WITH GFR
Anion gap: 13 (ref 5–15)
BUN: 23 mg/dL (ref 8–23)
CO2: 24 mmol/L (ref 22–32)
Calcium: 10.2 mg/dL (ref 8.9–10.3)
Chloride: 100 mmol/L (ref 98–111)
Creatinine, Ser: 0.57 mg/dL (ref 0.44–1.00)
GFR, Estimated: >60 mL/min (ref >60)
Glucose, Bld: 137 mg/dL — ABNORMAL HIGH (ref 70–99)
Potassium: 4.3 mmol/L (ref 3.5–5.1)
Sodium: 136 mmol/L (ref 135–145)

## 2024-06-13 LAB — CBC
HCT: 43.9 % (ref 36.0–46.0)
Hemoglobin: 13.8 g/dL (ref 12.0–15.0)
MCH: 29.9 pg (ref 26.0–34.0)
MCHC: 31.4 g/dL (ref 30.0–36.0)
MCV: 95.2 fL (ref 80.0–100.0)
Platelets: 311 K/uL (ref 150–400)
RBC: 4.61 MIL/uL (ref 3.87–5.11)
RDW: 12.7 % (ref 11.5–15.5)
WBC: 12.2 K/uL — ABNORMAL HIGH (ref 4.0–10.5)
nRBC: 0 % (ref 0.0–0.2)

## 2024-06-13 LAB — TROPONIN T, HIGH SENSITIVITY: Troponin T High Sensitivity: 19 ng/L (ref 0–19)

## 2024-06-13 LAB — PRO BRAIN NATRIURETIC PEPTIDE: Pro Brain Natriuretic Peptide: 2024 pg/mL — ABNORMAL HIGH (ref <300.0)

## 2024-06-13 MED ORDER — AZITHROMYCIN 500 MG PO TABS
500.0000 mg | ORAL_TABLET | Freq: Once | ORAL | Status: AC
  Administered 2024-06-13: 500 mg via ORAL
  Filled 2024-06-13: qty 1

## 2024-06-13 MED ORDER — METHYLPREDNISOLONE SODIUM SUCC 125 MG IJ SOLR
125.0000 mg | Freq: Once | INTRAMUSCULAR | Status: AC
  Administered 2024-06-13: 125 mg via INTRAVENOUS
  Filled 2024-06-13: qty 2

## 2024-06-13 MED ORDER — AZITHROMYCIN 250 MG PO TABS: ORAL_TABLET | ORAL | 0 refills | Status: AC

## 2024-06-13 MED ORDER — IPRATROPIUM-ALBUTEROL 0.5-2.5 (3) MG/3ML IN SOLN: 3.0000 mL | Freq: Four times a day (QID) | RESPIRATORY_TRACT | 1 refills | Status: AC | PRN

## 2024-06-13 MED ORDER — ALBUTEROL SULFATE HFA 108 (90 BASE) MCG/ACT IN AERS: 2.0000 | INHALATION_SPRAY | Freq: Four times a day (QID) | RESPIRATORY_TRACT | 2 refills | Status: AC | PRN

## 2024-06-13 MED ORDER — IPRATROPIUM-ALBUTEROL 0.5-2.5 (3) MG/3ML IN SOLN
3.0000 mL | Freq: Once | RESPIRATORY_TRACT | Status: AC
  Administered 2024-06-13: 3 mL via RESPIRATORY_TRACT
  Filled 2024-06-13: qty 3

## 2024-06-13 MED ORDER — PREDNISONE 10 MG PO TABS: 10.0000 mg | ORAL_TABLET | Freq: Every day | ORAL | 0 refills | Status: AC

## 2024-06-13 NOTE — Discharge Instructions (Addendum)
 Please take your steroids each day as prescribed.  Please use your albuterol inhaler 2 puffs every 6 hours as needed for shortness of breath.  Please take your antibiotics each day for the next 5 days.  Return to the emergency department if you feel that your shortness of breath is worsening if you develop a fever, chest pain or any other symptom personally concerning to yourself. Please follow-up with your pulmonologist within the next 2 days for recheck/reevaluation.

## 2024-06-13 NOTE — ED Notes (Signed)
 This tech changed pt after being notified pt experienced incontinence.

## 2024-06-13 NOTE — ED Notes (Signed)
 Pt given diet sprite and graham crackers and peanut butter. Tray table placed at bedside.

## 2024-06-13 NOTE — ED Triage Notes (Signed)
 Pt presents to the ED via POV from home with husband. Pt states that she wears 4L via [place] at baseline. States that she turned it up to 6L today. Pt denies pain. Denies fevers or increased cough.   Hx of pulmonary hypertension, pulmonary fibrosis, and CHF.

## 2024-06-13 NOTE — ED Provider Notes (Signed)
 [facility] Provider Note    Event Date/Time   First MD Initiated Contact with Patient 06/13/24 1638     (approximate)  History   Chief Complaint: Shortness of Breath  HPI  Joyce Evans is a 83 y.o. female with a past medical history of pulmonary fibrosis, atrial fibrillation, hypertension, chronic O2 requirement of 4 L who presents to the emergency department for worsening shortness of breath.  According to the patient she has what likely is pulmonary fibrosis according to the patient.  Patient is prescribed 4 L nasal cannula oxygen, however the last 2 to 3 weeks she has been requiring 6 L.  States she is getting progressively more out of breath with any type of exertion.  Patient has a cough but states that is chronic with some mild sputum production but again states that is chronic.  No fever.  Given the patient's worsening shortness of breath she came to the emergency department for evaluation.  Denies any increased lower extremity edema.  Physical Exam   Triage Vital Signs: ED Triage Vitals  Encounter Vitals Group     BP 06/13/24 1614 (!) 149/82     Girls Systolic BP Percentile --      Girls Diastolic BP Percentile --      Boys Systolic BP Percentile --      Boys Diastolic BP Percentile --      Pulse Rate 06/13/24 1614 93     Resp 06/13/24 1614 18     Temp 06/13/24 1614 98 F (36.7 C)     Temp Source 06/13/24 1614 Oral     SpO2 06/13/24 1614 97 %     Weight 06/13/24 1615 108 lb (49 kg)     Height 06/13/24 1615 5' 1 (1.549 m)     Head Circumference --      Peak Flow --      Pain Score 06/13/24 1615 0     Pain Loc --      Pain Education --      Exclude from Growth Chart --     Most recent vital signs: Vitals:   06/13/24 1614  BP: (!) 149/82  Pulse: 93  Resp: 18  Temp: 98 F (36.7 C)  SpO2: 97%    General: Awake, no distress.  CV:  Good peripheral perfusion.  Regular rate and rhythm  Resp:  Normal effort.  Equal breath sounds  bilaterally.  Mild crackles/rales bilaterally.  Diminished breath sounds bilaterally. Abd:  No distention.  Soft, nontender.  No rebound or guarding. Other:  Minimal lower extremity edema bilaterally.   ED Results / Procedures / Treatments   EKG  EKG viewed and interpreted by myself shows atrial fibrillation at 95 bpm with any slightly widened QRS, right axis deviation, largely normal intervals nonspecific ST changes.  No ST elevation.  RADIOLOGY  I have reviewed interpret the chest x-ray images.  Patient appears to have chronic scarring bilaterally. Radiology has read the x-ray as progressive interstitial opacities which may reflect underlying interstitial lung disease however patchy opacities left upper lung may reflect pneumonia.   MEDICATIONS ORDERED IN ED: Medications  methylPREDNISolone sodium succinate (SOLU-MEDROL) 125 mg/2 mL injection 125 mg (has no administration in time range)     IMPRESSION / MDM / ASSESSMENT AND PLAN / ED COURSE  I reviewed the triage vital signs and the nursing notes.  Patient's presentation is most consistent with acute presentation with potential threat to life or bodily function.  Patient  presents emergency department for several weeks of worsening shortness of breath.  Patient normally wears 4 L of oxygen however over the past 3 weeks or so she has been needing to turn it up to 6 L throughout the day especially with any minimal exertion.  Patient denies any known fever.  Patient's lab work today shows a slight leukocytosis 12,000 denies any chronic steroid use, otherwise reassuring CBC reassuring chemistry.  I have added on a troponin and a BNP.  Patient is EKG shows no significant findings chronic atrial fibrillation.  Patient's chest x-ray however shows interstitial lung disease with possible left upper lobe pneumonia, we will obtain a CT scan of the chest to further evaluate.  Will dose IV Solu-Medrol will continue to closely monitor while awaiting  for the results.  Patient's workup tonight shows a overall reassuring CBC slight leukocytosis 12,000, reassuring chemistry, BNP is elevated around 2000.  Troponin is normal/negative.  Chest x-ray shows interstitial lung disease but cannot rule out left upper lobe pneumonia.  Will obtain a CT scan.  CT scan shows interstitial lung disease no focal pneumonia cannot rule out ground glass opacities being edema versus infection.  Patient states she is feeling well currently.  I offered the patient admission to the hospital for likely pulmonary fibrosis exacerbation.  Patient states she would prefer to go home.  As patient is satting well on her home 4 L and states she is feeling better I believe it be reasonable to try to get the patient home and if she worsens she should return to the emergency department.  Will place the patient on prednisone  taper for the next 10 days will cover with Zithromax as a precaution given the CT findings and we will discharge with albuterol inhaler to be used every 6 hours for shortness of breath.  Patient will follow-up with her pulmonologist.  She will return to the emergency department if symptoms worsen.  FINAL CLINICAL IMPRESSION(S) / ED DIAGNOSES   Interstitial lung disease Pulmonary fibrosis   Note:  This document was prepared using Dragon voice recognition software and may include unintentional dictation errors.   Dorothyann Drivers, MD 06/13/24 2008

## 2024-08-29 ENCOUNTER — Ambulatory Visit: Admitting: Dermatology
